# Patient Record
Sex: Female | Born: 1935
Health system: Southern US, Community
[De-identification: ages and names within clinical notes are randomized; demographics above are authoritative.]

## PROBLEM LIST (undated history)

## (undated) DIAGNOSIS — J449 Chronic obstructive pulmonary disease, unspecified: Secondary | ICD-10-CM

## (undated) DIAGNOSIS — B001 Herpesviral vesicular dermatitis: Secondary | ICD-10-CM

## (undated) DIAGNOSIS — Z9889 Other specified postprocedural states: Secondary | ICD-10-CM

## (undated) DIAGNOSIS — I313 Pericardial effusion (noninflammatory): Secondary | ICD-10-CM

## (undated) DIAGNOSIS — N183 Chronic kidney disease, stage 3 unspecified: Secondary | ICD-10-CM

## (undated) DIAGNOSIS — M419 Scoliosis, unspecified: Secondary | ICD-10-CM

## (undated) DIAGNOSIS — I5032 Chronic diastolic (congestive) heart failure: Secondary | ICD-10-CM

## (undated) DIAGNOSIS — E7800 Pure hypercholesterolemia, unspecified: Secondary | ICD-10-CM

## (undated) DIAGNOSIS — R601 Generalized edema: Secondary | ICD-10-CM

## (undated) DIAGNOSIS — I10 Essential (primary) hypertension: Secondary | ICD-10-CM

## (undated) DIAGNOSIS — F0390 Unspecified dementia without behavioral disturbance: Secondary | ICD-10-CM

## (undated) DIAGNOSIS — M5416 Radiculopathy, lumbar region: Secondary | ICD-10-CM

## (undated) DIAGNOSIS — I2510 Atherosclerotic heart disease of native coronary artery without angina pectoris: Secondary | ICD-10-CM

## (undated) DIAGNOSIS — M479 Spondylosis, unspecified: Secondary | ICD-10-CM

## (undated) DIAGNOSIS — M4310 Spondylolisthesis, site unspecified: Secondary | ICD-10-CM

## (undated) DIAGNOSIS — R112 Nausea with vomiting, unspecified: Secondary | ICD-10-CM

## (undated) HISTORY — PX: OTHER SURGICAL HISTORY: SHX169

## (undated) HISTORY — DX: Radiculopathy, lumbar region: M54.16

## (undated) HISTORY — DX: Spondylolisthesis, site unspecified: M43.10

## (undated) HISTORY — DX: Atherosclerotic heart disease of native coronary artery without angina pectoris: I25.10

## (undated) HISTORY — DX: Spondylosis, unspecified: M47.9

## (undated) HISTORY — DX: Pericardial effusion (noninflammatory): I31.3

## (undated) HISTORY — PX: BACK SURGERY: SHX140

## (undated) HISTORY — DX: Morbid (severe) obesity due to excess calories: E66.01

## (undated) HISTORY — DX: Scoliosis, unspecified: M41.9

## (undated) HISTORY — DX: Generalized edema: R60.1

## (undated) HISTORY — PX: BREAST BIOPSY: SHX20

## (undated) HISTORY — DX: Herpesviral vesicular dermatitis: B00.1

## (undated) HISTORY — DX: Essential (primary) hypertension: I10

## (undated) HISTORY — DX: Pure hypercholesterolemia, unspecified: E78.00

## (undated) HISTORY — PX: VAGINAL HYSTERECTOMY: SHX2639

## (undated) HISTORY — PX: EYE SURGERY: SHX253

---

## 1998-03-14 ENCOUNTER — Ambulatory Visit (HOSPITAL_COMMUNITY): Admission: RE | Admit: 1998-03-14 | Discharge: 1998-03-14 | Payer: Self-pay | Admitting: *Deleted

## 1998-03-20 ENCOUNTER — Ambulatory Visit (HOSPITAL_COMMUNITY): Admission: RE | Admit: 1998-03-20 | Discharge: 1998-03-20 | Payer: Self-pay | Admitting: *Deleted

## 1999-03-05 ENCOUNTER — Ambulatory Visit (HOSPITAL_COMMUNITY): Admission: RE | Admit: 1999-03-05 | Discharge: 1999-03-05 | Payer: Self-pay | Admitting: Cardiology

## 1999-03-15 ENCOUNTER — Other Ambulatory Visit: Admission: RE | Admit: 1999-03-15 | Discharge: 1999-03-15 | Payer: Self-pay | Admitting: Obstetrics and Gynecology

## 1999-03-27 ENCOUNTER — Encounter (HOSPITAL_COMMUNITY): Admission: RE | Admit: 1999-03-27 | Discharge: 1999-06-04 | Payer: Self-pay | Admitting: Cardiology

## 1999-06-05 ENCOUNTER — Encounter (HOSPITAL_COMMUNITY): Admission: RE | Admit: 1999-06-05 | Discharge: 1999-09-03 | Payer: Self-pay | Admitting: Cardiology

## 2000-02-02 ENCOUNTER — Emergency Department (HOSPITAL_COMMUNITY): Admission: EM | Admit: 2000-02-02 | Discharge: 2000-02-02 | Payer: Self-pay | Admitting: Emergency Medicine

## 2000-02-02 ENCOUNTER — Encounter: Payer: Self-pay | Admitting: Emergency Medicine

## 2000-02-28 ENCOUNTER — Encounter: Payer: Self-pay | Admitting: Orthopedic Surgery

## 2000-03-07 ENCOUNTER — Inpatient Hospital Stay (HOSPITAL_COMMUNITY): Admission: RE | Admit: 2000-03-07 | Discharge: 2000-03-09 | Payer: Self-pay | Admitting: Orthopedic Surgery

## 2000-03-07 ENCOUNTER — Encounter: Payer: Self-pay | Admitting: Orthopedic Surgery

## 2000-03-15 ENCOUNTER — Encounter: Payer: Self-pay | Admitting: Emergency Medicine

## 2000-03-15 ENCOUNTER — Emergency Department (HOSPITAL_COMMUNITY): Admission: EM | Admit: 2000-03-15 | Discharge: 2000-03-15 | Payer: Self-pay | Admitting: Emergency Medicine

## 2000-05-14 ENCOUNTER — Emergency Department (HOSPITAL_COMMUNITY): Admission: EM | Admit: 2000-05-14 | Discharge: 2000-05-14 | Payer: Self-pay | Admitting: Emergency Medicine

## 2000-05-14 ENCOUNTER — Encounter: Payer: Self-pay | Admitting: Emergency Medicine

## 2001-11-19 ENCOUNTER — Other Ambulatory Visit: Admission: RE | Admit: 2001-11-19 | Discharge: 2001-11-19 | Payer: Self-pay | Admitting: Obstetrics and Gynecology

## 2003-04-25 ENCOUNTER — Ambulatory Visit (HOSPITAL_COMMUNITY): Admission: RE | Admit: 2003-04-25 | Discharge: 2003-04-25 | Payer: Self-pay | Admitting: Cardiovascular Disease

## 2003-04-25 HISTORY — PX: CARDIAC CATHETERIZATION: SHX172

## 2004-08-21 ENCOUNTER — Ambulatory Visit (HOSPITAL_COMMUNITY): Admission: RE | Admit: 2004-08-21 | Discharge: 2004-08-22 | Payer: Self-pay | Admitting: Ophthalmology

## 2005-12-29 ENCOUNTER — Emergency Department (HOSPITAL_COMMUNITY): Admission: EM | Admit: 2005-12-29 | Discharge: 2005-12-29 | Payer: Self-pay | Admitting: Emergency Medicine

## 2006-02-13 ENCOUNTER — Inpatient Hospital Stay (HOSPITAL_COMMUNITY): Admission: RE | Admit: 2006-02-13 | Discharge: 2006-02-15 | Payer: Self-pay | Admitting: Orthopaedic Surgery

## 2006-05-19 ENCOUNTER — Other Ambulatory Visit: Admission: RE | Admit: 2006-05-19 | Discharge: 2006-05-19 | Payer: Self-pay | Admitting: Radiology

## 2009-03-10 ENCOUNTER — Emergency Department (HOSPITAL_COMMUNITY): Admission: EM | Admit: 2009-03-10 | Discharge: 2009-03-10 | Payer: Self-pay | Admitting: Emergency Medicine

## 2009-04-04 DIAGNOSIS — R601 Generalized edema: Secondary | ICD-10-CM | POA: Insufficient documentation

## 2009-04-04 DIAGNOSIS — I3139 Other pericardial effusion (noninflammatory): Secondary | ICD-10-CM

## 2009-04-04 DIAGNOSIS — I313 Pericardial effusion (noninflammatory): Secondary | ICD-10-CM

## 2009-04-04 HISTORY — DX: Generalized edema: R60.1

## 2009-04-04 HISTORY — PX: SUBXYPHOID PERICARDIAL WINDOW: SHX5075

## 2009-04-04 HISTORY — DX: Pericardial effusion (noninflammatory): I31.3

## 2009-04-04 HISTORY — DX: Other pericardial effusion (noninflammatory): I31.39

## 2009-04-17 ENCOUNTER — Encounter: Admission: RE | Admit: 2009-04-17 | Discharge: 2009-06-12 | Payer: Self-pay | Admitting: Endocrinology

## 2009-04-20 ENCOUNTER — Inpatient Hospital Stay (HOSPITAL_COMMUNITY): Admission: EM | Admit: 2009-04-20 | Discharge: 2009-05-01 | Payer: Self-pay | Admitting: Emergency Medicine

## 2009-04-21 ENCOUNTER — Encounter (INDEPENDENT_AMBULATORY_CARE_PROVIDER_SITE_OTHER): Payer: Self-pay | Admitting: Emergency Medicine

## 2009-04-21 ENCOUNTER — Encounter: Payer: Self-pay | Admitting: Thoracic Surgery (Cardiothoracic Vascular Surgery)

## 2009-04-21 ENCOUNTER — Encounter (INDEPENDENT_AMBULATORY_CARE_PROVIDER_SITE_OTHER): Payer: Self-pay | Admitting: Internal Medicine

## 2009-04-21 ENCOUNTER — Ambulatory Visit: Payer: Self-pay | Admitting: Thoracic Surgery (Cardiothoracic Vascular Surgery)

## 2009-04-21 ENCOUNTER — Encounter: Payer: Self-pay | Admitting: Cardiology

## 2009-05-31 ENCOUNTER — Encounter
Admission: RE | Admit: 2009-05-31 | Discharge: 2009-05-31 | Payer: Self-pay | Admitting: Thoracic Surgery (Cardiothoracic Vascular Surgery)

## 2009-05-31 ENCOUNTER — Ambulatory Visit: Payer: Self-pay | Admitting: Thoracic Surgery (Cardiothoracic Vascular Surgery)

## 2009-09-25 ENCOUNTER — Encounter: Admission: RE | Admit: 2009-09-25 | Discharge: 2009-09-25 | Payer: Self-pay | Admitting: Cardiology

## 2010-06-25 ENCOUNTER — Ambulatory Visit: Payer: Self-pay | Admitting: Cardiology

## 2010-10-22 ENCOUNTER — Ambulatory Visit: Payer: Self-pay | Admitting: Cardiology

## 2011-02-11 LAB — URINE MICROSCOPIC-ADD ON

## 2011-02-11 LAB — POCT I-STAT 3, ART BLOOD GAS (G3+)
Acid-Base Excess: 4 mmol/L — ABNORMAL HIGH (ref 0.0–2.0)
Bicarbonate: 32.6 mEq/L — ABNORMAL HIGH (ref 20.0–24.0)
O2 Saturation: 99 %
Patient temperature: 98.2
TCO2: 35 mmol/L (ref 0–100)
pCO2 arterial: 69 mmHg (ref 35.0–45.0)
pH, Arterial: 7.282 — ABNORMAL LOW (ref 7.350–7.400)
pO2, Arterial: 148 mmHg — ABNORMAL HIGH (ref 80.0–100.0)

## 2011-02-11 LAB — BASIC METABOLIC PANEL
BUN: 22 mg/dL (ref 6–23)
BUN: 23 mg/dL (ref 6–23)
BUN: 28 mg/dL — ABNORMAL HIGH (ref 6–23)
BUN: 28 mg/dL — ABNORMAL HIGH (ref 6–23)
BUN: 34 mg/dL — ABNORMAL HIGH (ref 6–23)
BUN: 40 mg/dL — ABNORMAL HIGH (ref 6–23)
BUN: 47 mg/dL — ABNORMAL HIGH (ref 6–23)
CO2: 35 mEq/L — ABNORMAL HIGH (ref 19–32)
CO2: 38 mEq/L — ABNORMAL HIGH (ref 19–32)
CO2: 42 mEq/L — ABNORMAL HIGH (ref 19–32)
CO2: 43 mEq/L — ABNORMAL HIGH (ref 19–32)
CO2: >45 mEq/L — ABNORMAL HIGH (ref 19–32)
CO2: >45 mEq/L — ABNORMAL HIGH (ref 19–32)
CO2: >45 mEq/L — ABNORMAL HIGH (ref 19–32)
Calcium: 7.9 mg/dL — ABNORMAL LOW (ref 8.4–10.5)
Calcium: 8.5 mg/dL (ref 8.4–10.5)
Calcium: 8.6 mg/dL (ref 8.4–10.5)
Calcium: 8.6 mg/dL (ref 8.4–10.5)
Calcium: 8.7 mg/dL (ref 8.4–10.5)
Calcium: 8.9 mg/dL (ref 8.4–10.5)
Calcium: 8.9 mg/dL (ref 8.4–10.5)
Chloride: 101 mEq/L (ref 96–112)
Chloride: 88 mEq/L — ABNORMAL LOW (ref 96–112)
Chloride: 89 mEq/L — ABNORMAL LOW (ref 96–112)
Chloride: 90 mEq/L — ABNORMAL LOW (ref 96–112)
Chloride: 90 mEq/L — ABNORMAL LOW (ref 96–112)
Chloride: 95 mEq/L — ABNORMAL LOW (ref 96–112)
Chloride: 96 mEq/L (ref 96–112)
Creatinine, Ser: 1.32 mg/dL — ABNORMAL HIGH (ref 0.4–1.2)
Creatinine, Ser: 1.35 mg/dL — ABNORMAL HIGH (ref 0.4–1.2)
Creatinine, Ser: 1.43 mg/dL — ABNORMAL HIGH (ref 0.4–1.2)
Creatinine, Ser: 1.43 mg/dL — ABNORMAL HIGH (ref 0.4–1.2)
Creatinine, Ser: 1.52 mg/dL — ABNORMAL HIGH (ref 0.4–1.2)
Creatinine, Ser: 1.64 mg/dL — ABNORMAL HIGH (ref 0.4–1.2)
Creatinine, Ser: 1.8 mg/dL — ABNORMAL HIGH (ref 0.4–1.2)
GFR calc Af Amer: 33 mL/min — ABNORMAL LOW (ref >60)
GFR calc Af Amer: 37 mL/min — ABNORMAL LOW (ref >60)
GFR calc Af Amer: 40 mL/min — ABNORMAL LOW (ref >60)
GFR calc Af Amer: 43 mL/min — ABNORMAL LOW (ref >60)
GFR calc Af Amer: 43 mL/min — ABNORMAL LOW (ref >60)
GFR calc Af Amer: 46 mL/min — ABNORMAL LOW (ref >60)
GFR calc Af Amer: 47 mL/min — ABNORMAL LOW (ref >60)
GFR calc non Af Amer: 27 mL/min — ABNORMAL LOW (ref >60)
GFR calc non Af Amer: 31 mL/min — ABNORMAL LOW (ref >60)
GFR calc non Af Amer: 33 mL/min — ABNORMAL LOW (ref >60)
GFR calc non Af Amer: 36 mL/min — ABNORMAL LOW (ref >60)
GFR calc non Af Amer: 36 mL/min — ABNORMAL LOW (ref >60)
GFR calc non Af Amer: 38 mL/min — ABNORMAL LOW (ref >60)
GFR calc non Af Amer: 39 mL/min — ABNORMAL LOW (ref >60)
Glucose, Bld: 103 mg/dL — ABNORMAL HIGH (ref 70–99)
Glucose, Bld: 107 mg/dL — ABNORMAL HIGH (ref 70–99)
Glucose, Bld: 117 mg/dL — ABNORMAL HIGH (ref 70–99)
Glucose, Bld: 140 mg/dL — ABNORMAL HIGH (ref 70–99)
Glucose, Bld: 150 mg/dL — ABNORMAL HIGH (ref 70–99)
Glucose, Bld: 173 mg/dL — ABNORMAL HIGH (ref 70–99)
Glucose, Bld: 72 mg/dL (ref 70–99)
Potassium: 3.4 mEq/L — ABNORMAL LOW (ref 3.5–5.1)
Potassium: 3.5 mEq/L (ref 3.5–5.1)
Potassium: 3.6 mEq/L (ref 3.5–5.1)
Potassium: 3.9 mEq/L (ref 3.5–5.1)
Potassium: 4 mEq/L (ref 3.5–5.1)
Potassium: 4.1 mEq/L (ref 3.5–5.1)
Potassium: 4.8 mEq/L (ref 3.5–5.1)
Sodium: 139 mEq/L (ref 135–145)
Sodium: 140 mEq/L (ref 135–145)
Sodium: 140 mEq/L (ref 135–145)
Sodium: 141 mEq/L (ref 135–145)
Sodium: 142 mEq/L (ref 135–145)
Sodium: 142 mEq/L (ref 135–145)
Sodium: 143 mEq/L (ref 135–145)

## 2011-02-11 LAB — GLUCOSE, CAPILLARY
Glucose-Capillary: 100 mg/dL — ABNORMAL HIGH (ref 70–99)
Glucose-Capillary: 100 mg/dL — ABNORMAL HIGH (ref 70–99)
Glucose-Capillary: 101 mg/dL — ABNORMAL HIGH (ref 70–99)
Glucose-Capillary: 102 mg/dL — ABNORMAL HIGH (ref 70–99)
Glucose-Capillary: 102 mg/dL — ABNORMAL HIGH (ref 70–99)
Glucose-Capillary: 103 mg/dL — ABNORMAL HIGH (ref 70–99)
Glucose-Capillary: 103 mg/dL — ABNORMAL HIGH (ref 70–99)
Glucose-Capillary: 104 mg/dL — ABNORMAL HIGH (ref 70–99)
Glucose-Capillary: 107 mg/dL — ABNORMAL HIGH (ref 70–99)
Glucose-Capillary: 108 mg/dL — ABNORMAL HIGH (ref 70–99)
Glucose-Capillary: 111 mg/dL — ABNORMAL HIGH (ref 70–99)
Glucose-Capillary: 112 mg/dL — ABNORMAL HIGH (ref 70–99)
Glucose-Capillary: 112 mg/dL — ABNORMAL HIGH (ref 70–99)
Glucose-Capillary: 112 mg/dL — ABNORMAL HIGH (ref 70–99)
Glucose-Capillary: 114 mg/dL — ABNORMAL HIGH (ref 70–99)
Glucose-Capillary: 116 mg/dL — ABNORMAL HIGH (ref 70–99)
Glucose-Capillary: 117 mg/dL — ABNORMAL HIGH (ref 70–99)
Glucose-Capillary: 123 mg/dL — ABNORMAL HIGH (ref 70–99)
Glucose-Capillary: 123 mg/dL — ABNORMAL HIGH (ref 70–99)
Glucose-Capillary: 126 mg/dL — ABNORMAL HIGH (ref 70–99)
Glucose-Capillary: 133 mg/dL — ABNORMAL HIGH (ref 70–99)
Glucose-Capillary: 135 mg/dL — ABNORMAL HIGH (ref 70–99)
Glucose-Capillary: 137 mg/dL — ABNORMAL HIGH (ref 70–99)
Glucose-Capillary: 140 mg/dL — ABNORMAL HIGH (ref 70–99)
Glucose-Capillary: 140 mg/dL — ABNORMAL HIGH (ref 70–99)
Glucose-Capillary: 141 mg/dL — ABNORMAL HIGH (ref 70–99)
Glucose-Capillary: 148 mg/dL — ABNORMAL HIGH (ref 70–99)
Glucose-Capillary: 153 mg/dL — ABNORMAL HIGH (ref 70–99)
Glucose-Capillary: 156 mg/dL — ABNORMAL HIGH (ref 70–99)
Glucose-Capillary: 161 mg/dL — ABNORMAL HIGH (ref 70–99)
Glucose-Capillary: 162 mg/dL — ABNORMAL HIGH (ref 70–99)
Glucose-Capillary: 163 mg/dL — ABNORMAL HIGH (ref 70–99)
Glucose-Capillary: 167 mg/dL — ABNORMAL HIGH (ref 70–99)
Glucose-Capillary: 167 mg/dL — ABNORMAL HIGH (ref 70–99)
Glucose-Capillary: 168 mg/dL — ABNORMAL HIGH (ref 70–99)
Glucose-Capillary: 178 mg/dL — ABNORMAL HIGH (ref 70–99)
Glucose-Capillary: 183 mg/dL — ABNORMAL HIGH (ref 70–99)
Glucose-Capillary: 192 mg/dL — ABNORMAL HIGH (ref 70–99)
Glucose-Capillary: 193 mg/dL — ABNORMAL HIGH (ref 70–99)
Glucose-Capillary: 195 mg/dL — ABNORMAL HIGH (ref 70–99)
Glucose-Capillary: 56 mg/dL — ABNORMAL LOW (ref 70–99)
Glucose-Capillary: 57 mg/dL — ABNORMAL LOW (ref 70–99)
Glucose-Capillary: 60 mg/dL — ABNORMAL LOW (ref 70–99)
Glucose-Capillary: 60 mg/dL — ABNORMAL LOW (ref 70–99)
Glucose-Capillary: 62 mg/dL — ABNORMAL LOW (ref 70–99)
Glucose-Capillary: 66 mg/dL — ABNORMAL LOW (ref 70–99)
Glucose-Capillary: 68 mg/dL — ABNORMAL LOW (ref 70–99)
Glucose-Capillary: 68 mg/dL — ABNORMAL LOW (ref 70–99)
Glucose-Capillary: 72 mg/dL (ref 70–99)
Glucose-Capillary: 72 mg/dL (ref 70–99)
Glucose-Capillary: 74 mg/dL (ref 70–99)
Glucose-Capillary: 77 mg/dL (ref 70–99)
Glucose-Capillary: 78 mg/dL (ref 70–99)
Glucose-Capillary: 79 mg/dL (ref 70–99)
Glucose-Capillary: 80 mg/dL (ref 70–99)
Glucose-Capillary: 84 mg/dL (ref 70–99)
Glucose-Capillary: 85 mg/dL (ref 70–99)
Glucose-Capillary: 85 mg/dL (ref 70–99)
Glucose-Capillary: 88 mg/dL (ref 70–99)
Glucose-Capillary: 94 mg/dL (ref 70–99)
Glucose-Capillary: 95 mg/dL (ref 70–99)
Glucose-Capillary: 99 mg/dL (ref 70–99)
Glucose-Capillary: 99 mg/dL (ref 70–99)

## 2011-02-11 LAB — CBC
HCT: 29.1 % — ABNORMAL LOW (ref 36.0–46.0)
HCT: 29.1 % — ABNORMAL LOW (ref 36.0–46.0)
HCT: 32.2 % — ABNORMAL LOW (ref 36.0–46.0)
HCT: 33.3 % — ABNORMAL LOW (ref 36.0–46.0)
Hemoglobin: 10 g/dL — ABNORMAL LOW (ref 12.0–15.0)
Hemoglobin: 10.4 g/dL — ABNORMAL LOW (ref 12.0–15.0)
Hemoglobin: 9.2 g/dL — ABNORMAL LOW (ref 12.0–15.0)
Hemoglobin: 9.3 g/dL — ABNORMAL LOW (ref 12.0–15.0)
MCHC: 31.1 g/dL (ref 30.0–36.0)
MCHC: 31.2 g/dL (ref 30.0–36.0)
MCHC: 31.7 g/dL (ref 30.0–36.0)
MCHC: 31.8 g/dL (ref 30.0–36.0)
MCV: 82.8 fL (ref 78.0–100.0)
MCV: 83.2 fL (ref 78.0–100.0)
MCV: 83.2 fL (ref 78.0–100.0)
MCV: 83.5 fL (ref 78.0–100.0)
Platelets: 128 10*3/uL — ABNORMAL LOW (ref 150–400)
Platelets: 139 10*3/uL — ABNORMAL LOW (ref 150–400)
Platelets: 139 10*3/uL — ABNORMAL LOW (ref 150–400)
Platelets: 174 10*3/uL (ref 150–400)
RBC: 3.49 MIL/uL — ABNORMAL LOW (ref 3.87–5.11)
RBC: 3.5 MIL/uL — ABNORMAL LOW (ref 3.87–5.11)
RBC: 3.86 MIL/uL — ABNORMAL LOW (ref 3.87–5.11)
RBC: 4.03 MIL/uL (ref 3.87–5.11)
RDW: 18.5 % — ABNORMAL HIGH (ref 11.5–15.5)
RDW: 18.8 % — ABNORMAL HIGH (ref 11.5–15.5)
RDW: 19.2 % — ABNORMAL HIGH (ref 11.5–15.5)
RDW: 19.5 % — ABNORMAL HIGH (ref 11.5–15.5)
WBC: 6.2 10*3/uL (ref 4.0–10.5)
WBC: 7.9 10*3/uL (ref 4.0–10.5)
WBC: 8.1 10*3/uL (ref 4.0–10.5)
WBC: 8.5 10*3/uL (ref 4.0–10.5)

## 2011-02-11 LAB — BRAIN NATRIURETIC PEPTIDE
Pro B Natriuretic peptide (BNP): 1473 pg/mL — ABNORMAL HIGH (ref 0.0–100.0)
Pro B Natriuretic peptide (BNP): 1484 pg/mL — ABNORMAL HIGH (ref 0.0–100.0)
Pro B Natriuretic peptide (BNP): 1793 pg/mL — ABNORMAL HIGH (ref 0.0–100.0)
Pro B Natriuretic peptide (BNP): 32 pg/mL (ref 0.0–100.0)
Pro B Natriuretic peptide (BNP): 56 pg/mL (ref 0.0–100.0)

## 2011-02-11 LAB — POCT CARDIAC MARKERS
CKMB, poc: <1 ng/mL — ABNORMAL LOW (ref 1.0–8.0)
Myoglobin, poc: 75.3 ng/mL (ref 12–200)
Troponin i, poc: <0.05 ng/mL (ref 0.00–0.09)

## 2011-02-11 LAB — COMPREHENSIVE METABOLIC PANEL
ALT: 13 U/L (ref 0–35)
ALT: 14 U/L (ref 0–35)
ALT: 19 U/L (ref 0–35)
AST: 18 U/L (ref 0–37)
AST: 22 U/L (ref 0–37)
AST: 24 U/L (ref 0–37)
Albumin: 2.6 g/dL — ABNORMAL LOW (ref 3.5–5.2)
Albumin: 2.7 g/dL — ABNORMAL LOW (ref 3.5–5.2)
Albumin: 3.5 g/dL (ref 3.5–5.2)
Alkaline Phosphatase: 66 U/L (ref 39–117)
Alkaline Phosphatase: 69 U/L (ref 39–117)
Alkaline Phosphatase: 80 U/L (ref 39–117)
BUN: 47 mg/dL — ABNORMAL HIGH (ref 6–23)
BUN: 48 mg/dL — ABNORMAL HIGH (ref 6–23)
BUN: 49 mg/dL — ABNORMAL HIGH (ref 6–23)
CO2: 31 mEq/L (ref 19–32)
CO2: 33 mEq/L — ABNORMAL HIGH (ref 19–32)
CO2: 35 mEq/L — ABNORMAL HIGH (ref 19–32)
Calcium: 7.8 mg/dL — ABNORMAL LOW (ref 8.4–10.5)
Calcium: 8.3 mg/dL — ABNORMAL LOW (ref 8.4–10.5)
Calcium: 8.5 mg/dL (ref 8.4–10.5)
Chloride: 102 mEq/L (ref 96–112)
Chloride: 99 mEq/L (ref 96–112)
Chloride: 99 mEq/L (ref 96–112)
Creatinine, Ser: 1.65 mg/dL — ABNORMAL HIGH (ref 0.4–1.2)
Creatinine, Ser: 1.82 mg/dL — ABNORMAL HIGH (ref 0.4–1.2)
Creatinine, Ser: 1.92 mg/dL — ABNORMAL HIGH (ref 0.4–1.2)
GFR calc Af Amer: 31 mL/min — ABNORMAL LOW (ref >60)
GFR calc Af Amer: 33 mL/min — ABNORMAL LOW (ref >60)
GFR calc Af Amer: 37 mL/min — ABNORMAL LOW (ref >60)
GFR calc non Af Amer: 25 mL/min — ABNORMAL LOW (ref >60)
GFR calc non Af Amer: 27 mL/min — ABNORMAL LOW (ref >60)
GFR calc non Af Amer: 30 mL/min — ABNORMAL LOW (ref >60)
Glucose, Bld: 69 mg/dL — ABNORMAL LOW (ref 70–99)
Glucose, Bld: 74 mg/dL (ref 70–99)
Glucose, Bld: 75 mg/dL (ref 70–99)
Potassium: 4.4 mEq/L (ref 3.5–5.1)
Potassium: 4.7 mEq/L (ref 3.5–5.1)
Potassium: 5.1 mEq/L (ref 3.5–5.1)
Sodium: 136 mEq/L (ref 135–145)
Sodium: 141 mEq/L (ref 135–145)
Sodium: 142 mEq/L (ref 135–145)
Total Bilirubin: 0.7 mg/dL (ref 0.3–1.2)
Total Bilirubin: 0.8 mg/dL (ref 0.3–1.2)
Total Bilirubin: 1 mg/dL (ref 0.3–1.2)
Total Protein: 4.8 g/dL — ABNORMAL LOW (ref 6.0–8.3)
Total Protein: 5.2 g/dL — ABNORMAL LOW (ref 6.0–8.3)
Total Protein: 6.1 g/dL (ref 6.0–8.3)

## 2011-02-11 LAB — URINALYSIS, ROUTINE W REFLEX MICROSCOPIC
Bilirubin Urine: NEGATIVE
Glucose, UA: NEGATIVE mg/dL
Ketones, ur: NEGATIVE mg/dL
Leukocytes, UA: NEGATIVE
Nitrite: NEGATIVE
Protein, ur: NEGATIVE mg/dL
Specific Gravity, Urine: 1.008 (ref 1.005–1.030)
Urobilinogen, UA: 0.2 mg/dL (ref 0.0–1.0)
pH: 5 (ref 5.0–8.0)

## 2011-02-11 LAB — CARDIAC PANEL(CRET KIN+CKTOT+MB+TROPI)
CK, MB: 0.7 ng/mL (ref 0.3–4.0)
CK, MB: 0.7 ng/mL (ref 0.3–4.0)
Relative Index: INVALID (ref 0.0–2.5)
Relative Index: INVALID (ref 0.0–2.5)
Total CK: 23 U/L (ref 7–177)
Total CK: 34 U/L (ref 7–177)
Troponin I: 0.01 ng/mL (ref 0.00–0.06)
Troponin I: 0.01 ng/mL (ref 0.00–0.06)

## 2011-02-11 LAB — POCT I-STAT, CHEM 8
BUN: 53 mg/dL — ABNORMAL HIGH (ref 6–23)
Calcium, Ion: 1.1 mmol/L — ABNORMAL LOW (ref 1.12–1.32)
Chloride: 100 mEq/L (ref 96–112)
Creatinine, Ser: 1.7 mg/dL — ABNORMAL HIGH (ref 0.4–1.2)
Glucose, Bld: 65 mg/dL — ABNORMAL LOW (ref 70–99)
HCT: 35 % — ABNORMAL LOW (ref 36.0–46.0)
Hemoglobin: 11.9 g/dL — ABNORMAL LOW (ref 12.0–15.0)
Potassium: 4.1 mEq/L (ref 3.5–5.1)
Sodium: 140 mEq/L (ref 135–145)
TCO2: 34 mmol/L (ref 0–100)

## 2011-02-11 LAB — APTT: aPTT: 35 seconds (ref 24–37)

## 2011-02-11 LAB — TYPE AND SCREEN
ABO/RH(D): A POS
Antibody Screen: NEGATIVE

## 2011-02-11 LAB — BODY FLUID CULTURE: Culture: NO GROWTH

## 2011-02-11 LAB — LIPID PANEL
Cholesterol: 85 mg/dL (ref 0–200)
HDL: 27 mg/dL — ABNORMAL LOW (ref >39)
LDL Cholesterol: 39 mg/dL (ref 0–99)
Total CHOL/HDL Ratio: 3.1 RATIO
Triglycerides: 93 mg/dL (ref <150)
VLDL: 19 mg/dL (ref 0–40)

## 2011-02-11 LAB — LACTATE DEHYDROGENASE, PLEURAL OR PERITONEAL FLUID: LD, Fluid: 1933 U/L

## 2011-02-11 LAB — PROTEIN, BODY FLUID: Total protein, fluid: 4.3 g/dL

## 2011-02-11 LAB — PROTIME-INR
INR: 1.2 (ref 0.00–1.49)
Prothrombin Time: 15.9 seconds — ABNORMAL HIGH (ref 11.6–15.2)

## 2011-02-11 LAB — URINE CULTURE
Colony Count: NO GROWTH
Culture: NO GROWTH

## 2011-02-11 LAB — GLUCOSE, SEROUS FLUID: Glucose, Fluid: 49 mg/dL

## 2011-02-11 LAB — DIFFERENTIAL
Basophils Absolute: 0 10*3/uL (ref 0.0–0.1)
Basophils Relative: 0 % (ref 0–1)
Eosinophils Absolute: 0.2 10*3/uL (ref 0.0–0.7)
Eosinophils Relative: 3 % (ref 0–5)
Lymphocytes Relative: 16 % (ref 12–46)
Lymphs Abs: 1 10*3/uL (ref 0.7–4.0)
Monocytes Absolute: 0.5 10*3/uL (ref 0.1–1.0)
Monocytes Relative: 9 % (ref 3–12)
Neutro Abs: 4.5 10*3/uL (ref 1.7–7.7)
Neutrophils Relative %: 72 % (ref 43–77)

## 2011-02-11 LAB — BODY FLUID CELL COUNT WITH DIFFERENTIAL
Eos, Fluid: 2 %
Lymphs, Fluid: 51 %
Monocyte-Macrophage-Serous Fluid: 3 % — ABNORMAL LOW (ref 50–90)
Neutrophil Count, Fluid: 43 % — ABNORMAL HIGH (ref 0–25)
Total Nucleated Cell Count, Fluid: 1111 cu mm — ABNORMAL HIGH (ref 0–1000)

## 2011-02-11 LAB — CK TOTAL AND CKMB (NOT AT ARMC)
CK, MB: 0.8 ng/mL (ref 0.3–4.0)
Relative Index: INVALID (ref 0.0–2.5)
Total CK: 37 U/L (ref 7–177)

## 2011-02-11 LAB — HEMOGLOBIN A1C
Hgb A1c MFr Bld: 6.2 % — ABNORMAL HIGH (ref 4.6–6.1)
Mean Plasma Glucose: 131 mg/dL

## 2011-02-11 LAB — MISCELLANEOUS TEST

## 2011-02-11 LAB — TROPONIN I: Troponin I: 0.03 ng/mL (ref 0.00–0.06)

## 2011-02-11 LAB — GRAM STAIN

## 2011-02-11 LAB — AMYLASE, BODY FLUID: Amylase, Fluid: 59 U/L

## 2011-02-11 LAB — TSH: TSH: 1.466 u[IU]/mL (ref 0.350–4.500)

## 2011-02-11 LAB — CA 125
CA 125: 202.5 U/mL — ABNORMAL HIGH (ref 0.0–30.2)
CA 125: 205.4 U/mL — ABNORMAL HIGH (ref 0.0–30.2)

## 2011-02-12 LAB — POCT I-STAT, CHEM 8
BUN: 52 mg/dL — ABNORMAL HIGH (ref 6–23)
Calcium, Ion: 1.13 mmol/L (ref 1.12–1.32)
Chloride: 108 mEq/L (ref 96–112)
Creatinine, Ser: 1.7 mg/dL — ABNORMAL HIGH (ref 0.4–1.2)
Glucose, Bld: 143 mg/dL — ABNORMAL HIGH (ref 70–99)
HCT: 33 % — ABNORMAL LOW (ref 36.0–46.0)
Hemoglobin: 11.2 g/dL — ABNORMAL LOW (ref 12.0–15.0)
Potassium: 4.3 mEq/L (ref 3.5–5.1)
Sodium: 142 mEq/L (ref 135–145)
TCO2: 27 mmol/L (ref 0–100)

## 2011-02-12 LAB — BRAIN NATRIURETIC PEPTIDE: Pro B Natriuretic peptide (BNP): 81 pg/mL (ref 0.0–100.0)

## 2011-02-12 LAB — POCT I-STAT 3, ART BLOOD GAS (G3+)
Acid-Base Excess: 1 mmol/L (ref 0.0–2.0)
Bicarbonate: 27.5 mEq/L — ABNORMAL HIGH (ref 20.0–24.0)
O2 Saturation: 95 %
TCO2: 29 mmol/L (ref 0–100)
pCO2 arterial: 49.4 mmHg — ABNORMAL HIGH (ref 35.0–45.0)
pH, Arterial: 7.353 (ref 7.350–7.400)
pO2, Arterial: 82 mmHg (ref 80.0–100.0)

## 2011-02-12 LAB — CBC
HCT: 30.7 % — ABNORMAL LOW (ref 36.0–46.0)
Hemoglobin: 10 g/dL — ABNORMAL LOW (ref 12.0–15.0)
MCHC: 32.4 g/dL (ref 30.0–36.0)
MCV: 82 fL (ref 78.0–100.0)
Platelets: 161 10*3/uL (ref 150–400)
RBC: 3.75 MIL/uL — ABNORMAL LOW (ref 3.87–5.11)
RDW: 18.3 % — ABNORMAL HIGH (ref 11.5–15.5)
WBC: 6.9 10*3/uL (ref 4.0–10.5)

## 2011-03-14 ENCOUNTER — Encounter: Payer: Self-pay | Admitting: Cardiology

## 2011-03-15 DIAGNOSIS — I10 Essential (primary) hypertension: Secondary | ICD-10-CM | POA: Insufficient documentation

## 2011-03-15 DIAGNOSIS — I2510 Atherosclerotic heart disease of native coronary artery without angina pectoris: Secondary | ICD-10-CM | POA: Insufficient documentation

## 2011-03-15 DIAGNOSIS — R0600 Dyspnea, unspecified: Secondary | ICD-10-CM | POA: Insufficient documentation

## 2011-03-18 ENCOUNTER — Ambulatory Visit (INDEPENDENT_AMBULATORY_CARE_PROVIDER_SITE_OTHER): Payer: Medicare Other | Admitting: Cardiology

## 2011-03-18 ENCOUNTER — Encounter: Payer: Self-pay | Admitting: Cardiology

## 2011-03-18 DIAGNOSIS — E785 Hyperlipidemia, unspecified: Secondary | ICD-10-CM

## 2011-03-18 DIAGNOSIS — M5430 Sciatica, unspecified side: Secondary | ICD-10-CM | POA: Insufficient documentation

## 2011-03-18 DIAGNOSIS — E119 Type 2 diabetes mellitus without complications: Secondary | ICD-10-CM | POA: Insufficient documentation

## 2011-03-18 DIAGNOSIS — E1169 Type 2 diabetes mellitus with other specified complication: Secondary | ICD-10-CM | POA: Insufficient documentation

## 2011-03-18 DIAGNOSIS — E782 Mixed hyperlipidemia: Secondary | ICD-10-CM | POA: Insufficient documentation

## 2011-03-18 DIAGNOSIS — I509 Heart failure, unspecified: Secondary | ICD-10-CM

## 2011-03-18 DIAGNOSIS — I5032 Chronic diastolic (congestive) heart failure: Secondary | ICD-10-CM

## 2011-03-18 NOTE — Assessment & Plan Note (Signed)
The patient has a history of chronic diastolic congestive heart failure.  Her symptoms have remained stable on 80 mg of furosemide daily.  She has a past history of anasarca secondary to pericardial effusion with tamponade and she underwent a pericardial window in June of 2010.  The patient is not been expressing any chest pain to suggest angina.  She had her last cardiac catheterization in 2004 which showed only mild to moderate coronary artery disease.  She had a nuclear stress test 01/29/06 for preoperative clearance for back surgery at that time and she had a normal adenosine Cardiolite stress test with an ejection fraction of 70%.

## 2011-03-18 NOTE — Progress Notes (Signed)
[name] Date of Birth:  [birth year] [facility] [address]   [address] [place], [place]  [address] [phone]           Fax   [phone]  History of Present Illness: This pleasant 77 year old woman is seen for a scheduled followup office visit.  She has a complex past medical history.  She has a history of chronic diastolic congestive heart failure.  She had cardiac catheterization in 2000 and again in 2004 which did not show any severe coronary disease.  She was found to have mild to moderate distal disease.  Her last nuclear stress test was 01/29/06 and was an adenosine study which showed an ejection fraction of 70% and no ischemia.  The patient had a history of anasarca secondary to pericardial effusion with tamponade in June of 2010 and at pericardial window performed at that time.  Her last echocardiogram was 09/25/09 and showed normal left ventricle systolic function and showed evidence of mild aortic stenosis and normal pulmonary artery pressure.  She does have impaired relaxation with normal systolic function and mild LVH.  Since last visit she's been doing well from a cardiac standpoint but has had progressive problems with low back pain with sciatica and pain radiating down the left leg with some weakness in the left leg.  She has an appointment to see a neurosurgeon soon and anticipates possible surgery  Current Outpatient Prescriptions  Medication Sig Dispense Refill  . amLODipine (NORVASC) 10 MG tablet Take 10 mg by mouth daily.        Marland Kitchen aspirin 81 MG tablet Take 81 mg by mouth daily.        Marland Kitchen atorvastatin (LIPITOR) 40 MG tablet Take 40 mg by mouth daily.        . Etodolac (LODINE PO) Take by mouth daily.        . furosemide (LASIX) 80 MG tablet Take 80 mg by mouth daily. Sometimes twice a day       . glipiZIDE (GLUCOTROL) 5 MG tablet Take 5 mg by mouth daily.       . insulin aspart (NOVOLOG) 100 UNIT/ML injection Inject 3 Units into the skin 3  (three) times daily before meals.        . insulin glargine (LANTUS) 100 UNIT/ML injection Inject 20 Units into the skin at bedtime.       . IRON PO Take 1 tablet by mouth daily.        . metoprolol succinate (TOPROL-XL) 25 MG 24 hr tablet Take 25 mg by mouth 2 (two) times daily.        . Pioglitazone HCl (ACTOS PO) Take 15 mg by mouth daily.       . NON FORMULARY oxygen       . valsartan (DIOVAN) 320 MG tablet Take 320 mg by mouth daily.          Allergies  Allergen Reactions  . Lodine (Etodolac) Other (See Comments)    dizziness    Patient Active Problem List  Diagnoses  . Coronary artery disease  . Hypertension  . Dyspnea  . Anasarca  . Chronic diastolic congestive heart failure  . Dyslipidemia  . Diabetes mellitus  . Sciatica neuralgia    History  Smoking status  . Never Smoker   Smokeless tobacco  . Not on file    History  Alcohol Use: Not on file    Family History  Problem Relation Age of Onset  .  Heart attack Father   . Hypertension Father   . Stroke Mother   . Angina Mother   . Coronary artery disease      Review of Systems: Constitutional: no fever chills diaphoresis or fatigue or change in weight.  Head and neck: no hearing loss, no epistaxis, no photophobia or visual disturbance. Respiratory: No cough, shortness of breath or wheezing. Cardiovascular: No chest pain peripheral edema, palpitations. Gastrointestinal: No abdominal distention, no abdominal pain, no change in bowel habits hematochezia or melena. Genitourinary: No dysuria, no frequency, no urgency, no nocturia. Musculoskeletal:Low back pain with sciatica down left leg Neurological: No dizziness, no headaches, no numbness, no seizures, no syncope, no weakness, no tremors. Hematologic: No lymphadenopathy, no easy bruising. Psychiatric: No confusion, no hallucinations, no sleep disturbance.    Physical Exam: Filed Vitals:   03/18/11 0949  BP: 130/80  Pulse: 76  The general appearance  reveals a well-developed well-nourished woman in no distress.Pupils equal and reactive.   Extraocular Movements are full.  There is no scleral icterus.  The mouth and pharynx are normal.  The neck is supple.  The carotids reveal no bruits.  The jugular venous pressure is normal.  The thyroid is not enlarged.  There is no lymphadenopathy.The chest is clear to percussion and auscultation. There are no rales or rhonchi. Expansion of the chest is symmetrical.The precordium is quiet.  The first heart sound is normal.  The second heart sound is physiologically split.  There is no murmur gallop rub or click.  There is no abnormal lift or heave.The abdomen is soft and nontender. Bowel sounds are normal. The liver and spleen are not enlarged. There Are no abdominal masses. There are no bruits.The pedal pulses are good.  There is no phlebitis or edema.  There is no cyanosis or clubbing.Strength is normal and symmetrical in all extremities.  There is no lateralizing weakness.  There are no sensory deficits.The skin is warm and dry.  There is no rash.   Assessment / Plan: Continue present medication.  Recheck in 4 months for followup office visit.

## 2011-03-18 NOTE — Assessment & Plan Note (Signed)
The patient has had a long history of low back pain and has had previous surgery in her back.  Her surgery was done by her orthopedist.  She is now having pain down her left leg with some weakness in the left leg and has an appointment to see a neurosurgeon soon.  She anticipates that she will probably require surgery.  Her symptoms appear to be progressive

## 2011-03-19 NOTE — Consult Note (Signed)
[name], [name]                  ACCOUNT NO.:  [id number]   MEDICAL RECORD NO.:  [id number]          PATIENT TYPE:  INP   LOCATION:  2312                         FACILITY:  [facility]   PHYSICIAN:  Salvatore Decent. Cornelius Moras, M.D. DATE OF BIRTH:  1931-10-07   DATE OF CONSULTATION:  04/21/2009  DATE OF DISCHARGE:                                 CONSULTATION   REQUESTING PHYSICIAN:  Laurie Clement, MD   PRIMARY CARE PHYSICIAN:  Laurie Fells. Altheimer, MD   REASON FOR CONSULTATION:  Pericardial effusion with pericardial  tamponade.   HISTORY OF PRESENT ILLNESS:  [name] is a 77 year old morbidly obese  white female with history of hypertension and type 2 diabetes mellitus,  who presents with a several-week history of progressive shortness of  breath, orthopnea, and severe bilateral lower extremity edema.  The  patient was admitted to the hospital with severe volume overload and  generalized anasarca with suspected diastolic heart failure.  Chest x-  ray reveals enlarged cardiac silhouette with small bilateral pleural  effusions and pulmonary vascular congestion.  Electrocardiogram  demonstrates normal sinus rhythm with a low voltage throughout and  nonspecific ST and T-wave changes.  Serial cardiac enzymes reveal no  sign of myocardial ischemia.  Two-D echocardiogram demonstrates a large  free-flowing pericardial effusion with diastolic collapse consistent  with pericardial tamponade.  Underlying left ventricular function  appears normal, and no other significant abnormalities are noted.  Cardiothoracic surgical consultation was requested.   Review of systems is documented in the patient's chart.  The patient has  had poor appetite for several days.  She does report having progressive  shortness of breath and edema for several months, but this has been much  worse over the last couple of weeks.  The patient also reports upper  respiratory tract infection approximately 3 months ago.  She has not  had  any recent fevers or chills or night sweats.  She denies any chest pain.  She has severe orthopnea and severe edema with resting shortness of  breath.  Bowel function is normal.  She has no difficulty swallowing.  The remainder of her review of systems is unremarkable.   PAST MEDICAL HISTORY:  1. Hypertension.  2. Diastolic congestive heart failure, chronic.  3. Type 2 diabetes mellitus.   PAST SURGICAL HISTORY:  1. Lumbar laminectomy.  2. Hysterectomy.   FAMILY HISTORY:  Noncontributory.   SOCIAL HISTORY:  The patient is married and lives with her husband  locally here in [place].  She lives a sedentary lifestyle, but has  still been ambulatory and functional and drives an automobile until  recently.  She is a nonsmoker and she denies alcohol consumption.   Medications prior to admission include Lantus insulin, glipizide,  NovoLog sliding scale, aspirin, Lipitor, lisinopril, Lasix, amlodipine,  doxazosin, Diovan, metoprolol, Imdur.   Drug allergies include CODEINE and SULFA.   PHYSICAL EXAMINATION:  GENERAL:  The patient is a morbidly obese white  female, who appears her stated age in no acute distress.  HEENT:  Unrevealing.  NECK:  There is no obvious jugular venous distention,  but it would be  difficult to tell otherwise.  There are no carotid bruits.  LUNGS:  Auscultation of the chest demonstrates diminished breath sounds at both  lung bases.  No wheezes or rhonchi noted.  CARDIOVASCULAR:  Notable for  distant heart sounds.  No obvious murmurs, rubs, or gallops are  appreciated.  ABDOMEN:  Obese, soft, and nontender.  EXTREMITIES:  Warm and adequately perfused.  There is severe (4+)  bilateral lower extremity edema with some weeping of transudative fluid  through the skin.  RECTAL:  Deferred.  GU:  Deferred.  NEUROLOGIC:  Nonfocal.   DIAGNOSTIC TESTS:  Two-D echocardiogram performed earlier today was  reviewed.  This demonstrates a large free-flowing  pericardial effusion  with evidence of right ventricular collapse consistent with tamponade.  The underlying left ventricular function appears normal.  No other  obvious abnormalities are noted.   IMPRESSION:  Large pericardial effusion with pericardial tamponade.   PLAN:  We planned to proceed directly to the operating room for  subxiphoid pericardial window.  I have discussed options at length with  [name] and her husband.  Alternative treatment strategies have been  discussed.  They understand and accept all potential associated risks of  surgery and desired to proceed as described.  All of their questions  have been addressed.      Salvatore Decent. Cornelius Moras, M.D.  Electronically Signed     CHO/MEDQ  D:  04/21/2009  T:  04/22/2009  Job:  [id number]   cc:   [name], M.D.  [name], M.D.

## 2011-03-19 NOTE — Op Note (Signed)
NAMEANGELLI, [name]                  ACCOUNT NO.:  [id number]   MEDICAL RECORD NO.:  [id number]          PATIENT TYPE:  INP   LOCATION:  2312                         FACILITY:  [facility]   PHYSICIAN:  Salvatore Decent. Cornelius Moras, M.D. DATE OF BIRTH:  1931/09/23   DATE OF PROCEDURE:  04/21/2009  DATE OF DISCHARGE:                               OPERATIVE REPORT   PREOPERATIVE DIAGNOSIS:  Pericardial effusion with pericardial  tamponade.   POSTOPERATIVE DIAGNOSIS:  Pericardial effusion with pericardial  tamponade.   PROCEDURE:  Subxiphoid pericardial window.   SURGEON:  Salvatore Decent. Cornelius Moras, MD   ASSISTANT:  Ms. Gus Puma, RNFA   ANESTHESIA:  General.   BRIEF CLINICAL NOTE:  The patient is a 77 year old morbidly obese female  with hypertension and diabetes who presents with class IV congestive  heart failure and right heart failure.  A 2-D echocardiogram  demonstrates a large free-flowing pericardial effusion with impending  tamponade.  A full consultation note has been dictated previously.  The  patient is brought to the operating room for urgent surgical  intervention.   OPERATIVE NOTE DETAILS:  Following full informed consent, the patient is  brought to the operating room on the above-mentioned date and placed in  the supine position on the operating table.  General endotracheal  anesthesia is induced under the care and direction of Dr. Arta Bruce.  A radial arterial line was placed.  Central venous catheter was placed.  A Foley catheter is placed.  Intravenous antibiotics were administered.  The patient's anterior chest, abdomen, both groins were prepared and  draped in sterile manner.  A small midline incision was performed  beginning at the xiphoid process and extending towards the umbilicus for  several centimeters.  The incision is completed through the subcutaneous  tissues with electrocautery.  The linea alba is split in the midline.  The left body of the rectus abdominis muscle is  retracted anteriorly and  the underlying preperitoneal fascia is retracted inferiorly.  The  anterior surface of the pericardium was identified.  The pericardium was  incised with electrocautery.  Upon entry into the pericardial sac, a  large volume of old dark bloody fluid is evacuated.  A total of 650 mL  of fluid is evacuated.  Portions of the fluid are trapped and sent for  cytology, routine culture and sensitivity, and a variety of laboratory  data.  A small 2 x 2 cm circular portion of the pericardium is excised  with electrocautery and sent for routine histology.  The pericardial sac  is now drained with a 28-French Bard drain exited through a separate  stab incision.  Transesophageal echocardiogram was performed, both prior  to and after drainage, and confirms complete evacuation of the  pericardial fluid.  There is normal left ventricular function with no  other significant abnormalities noted.  Of note, the epicardial surface  of the heart appears chronically inflamed with hemorrhagic pericarditis.  The abdominal fascia is closed in the midline with interrupted #1 Vicryl  sutures.  The soft tissues anterior to the fascia  are closed in multiple  layers and skin was closed with subcuticular skin  closure.  The chest tube is fixed to closed suction drainage device.  The patient is extubated in the operating room and transported to the  Post Anesthesia Care Unit in stable condition.  There are no  intraoperative complications.  Estimated blood loss was trivial.      Salvatore Decent. Cornelius Moras, M.D.  Electronically Signed     CHO/MEDQ  D:  04/21/2009  T:  04/22/2009  Job:  [id number]   cc:   [name], M.D.  [name], M.D.

## 2011-03-19 NOTE — Discharge Summary (Signed)
[name], [name]                  ACCOUNT NO.:  [id number]   MEDICAL RECORD NO.:  [id number]          PATIENT TYPE:  INP   LOCATION:  4741                         FACILITY:  [facility]   PHYSICIAN:  Corinna L. Lendell [name], MDDATE OF BIRTH:  03/18/1931   DATE OF ADMISSION:  04/20/2009  DATE OF DISCHARGE:  04/30/2009                               DISCHARGE SUMMARY   DISCHARGE DIAGNOSES:  1. Pericardial effusion with tamponade.  2. Status post subxiphoid pericardial window.  3. Anasarca.  4. Type 2 diabetes.  5. Morbid obesity  6. Hypertension.  7. Chronic diastolic congestive heart failure.  8. Chronic kidney disease.  9. Deconditioning.  10.Healing herpes labialis.  11.History of hysterectomy, unclear whether oophorectomy was done.  12.History of lumbar laminectomy.   DISCHARGE MEDICATIONS:  1. Lasix 80 mg p.o. q. 7:00 a.m. and 2:00 p.m., please adjust based on      renal function and fluid status.  2.  Aspirin 81 mg a day.  2. Glipizide 5 mg a day.  3. Lipitor 40 mg a day.  4. Lisinopril 40 mg a day.  5. Metoprolol 25 mg p.o. b.i.d.  6. KCL 40 mEq a day.  7. Lantus insulin 8 units subcutaneously nightly.  8. Sliding scale Humalog with meals.  9. Tylenol 650 mg p.o. q.4 h., p.r.n. pain.  10.Dulcolax 10 mg a day as needed for constipation..  11.Her amlodipine, doxazosin, Diovan and Imdur have been stopped.   CONDITION:  Stable.   DIET:  Should be low-salt diabetic.   ACTIVITY:  Continue physical therapy, occupational therapy.   CONSULTATIONS:  1. Dr. Patty Sermons.  2. Dr. Cornelius Moras.   PROCEDURES:  See above.   FOLLOW-UP:  She will need CBG q.a.c. and h.s.  She will need daily  weights.  She will need a basic metabolic panel in 2 days and  periodically thereafter, defer to nursing home physician.  Upon  discharge from nursing home, she will need follow-up with Dr. Patty Sermons  and Dr. Leslie Dales.  She will also need follow-up with her gynecologist  for pelvic exam and any further  workup if needed.   LABORATORY DATA:  ABG on admission showed a pH of 7.353, pCO2 49, pO2  82, bicarbonate 27, saturation 95%, unclear what oxygen if any this was  on.  CBC on admission significant for a hemoglobin of 11.2, hematocrit  33.  Basic metabolic panel on admission significant for a glucose of  143, BUN of 52, creatinine 1.7.  At discharge, her BUN is 28, creatinine  1.8.  Hemoglobin A1c was 6.2.  BNP on admission was 81.  Serial cardiac  enzymes negative.  LDL 39, HDL 27, TSH 1.466.  CA-125 was 202.  Pericardial fluid showed 49 glucose, 4.3 total protein, LDH 1933,  amylase 59.  It was red, turbid.  White count 1011 with 43% neutrophils,  51% lymphocytes.  She had a urinalysis on April 27, 2009, which showed  moderate blood, but negative nitrites, negative leukocyte esterase.  Urine culture on admission was negative.  Gram stain of the pericardial  fluid showed few white cells,  no organisms and culture was negative.  Cytology of the pericardial fluid showed no malignant cells, rare  mesothelial cells and chronic inflammation.   SPECIAL STUDIES:  Radiology on admission, two-views of the chest showed  cardiomegaly with tiny bilateral pleural effusions and vascular  congestion.  She had serial chest x-rays.  CT of the abdomen and pelvis  without contrast showed no mass or lymphadenopathy, marked diffuse  abdominal wall edema, mild intra-abdominal ascites, no evidence of  pelvic mass.  CT chest without contrast on April 23, 2009, showed no  mass.  There were small bilateral pleural effusions.  Echocardiogram on  April 21, 2009, showed ejection fraction 60-65%, abnormal left  ventricular relaxation, large free-flowing pericardial effusion with  tamponade physiology.  Peak pulmonary artery pressure was 41 mm.  EKG  showed normal sinus rhythm with low voltage throughout.   HISTORY AND HOSPITAL COURSE:  [name] is a 77 year old white female  patient of Dr. Leslie Dales and Patty Sermons who  had, had worsening weight  gain, shortness of breath, orthopnea and edema.  She had been followed  by Dr. Patty Sermons, who was consulted.  Please see H and P for complete  admission details.  She had a blood pressure of 100/53, pulse 78,  temperature of 97.4, respiratory rate 18, oxygen saturations 96% on room  air.  She had obesity, clear lungs, 3+ edema.  She was felt to have  diastolic heart failure and anasarca.  She was started on IV Lasix.  An  echocardiogram was done which showed tamponade.  Cardiothoracic surgery  was consulted and performed subxiphoid window.  She also had a central  line placed at that time.  She had been placed on empiric antibiotics.  The culture and cytology came back negative.  Her medications were  adjusted for the anasarca.  Dr. Rito Ehrlich ordered a CA-125 to further  evaluate her pericardial effusion and anasarca.  It was elevated,  however, CAT scan showed no pelvic mass, abdominal mass or lung mass.  After researching and discussing with Dr. Cyndie Chime, I suspect this is  related to the ascites and really cannot be evaluated in this clinical  situation.  Nevertheless, I have recommended that the patient follow up  with her gynecologist for a complete pelvic exam and further workup if  needed.  She had a hysterectomy in 1979, and she does not recall whether  or not she had an oophorectomy.  The patient has diuresed over 20  pounds.  Her shortness of breath has improved.  Her other medical  problems remained  stable.  She is, however, quite debilitated and is going to skilled  nursing facility for continued rehabilitation.  Her fluid status, renal  function and electrolytes will need to be monitored closely and any  adjustments in Lasix and other medications can be made as needed.  Total  time on the day of discharge is 45 minutes.      Corinna L. Lendell [name], MD  Electronically Signed     CLS/MEDQ  D:  05/01/2009  T:  05/01/2009  Job:  [id number]   cc:    Cassell Clement, M.D.  Salvatore Decent. Cornelius Moras, M.D.  Veverly Fells. Altheimer, M.D.

## 2011-03-19 NOTE — H&P (Signed)
[name], [name]                  ACCOUNT NO.:  [id number]   MEDICAL RECORD NO.:  [id number]          PATIENT TYPE:  INP   LOCATION:  1843                         FACILITY:  [facility]   PHYSICIAN:  Eduard Clos, MDDATE OF BIRTH:  December 01, 1930   DATE OF ADMISSION:  04/20/2009  DATE OF DISCHARGE:                              HISTORY & PHYSICAL   PRIMARY CARDIOLOGIST:  Cassell Clement, M.D.   PRIMARY CARE PHYSICIAN:  Veverly Fells. Altheimer, M.D.   CHIEF COMPLAINT:  Shortness of breath.   HISTORY OF PRESENT ILLNESS:  A 77 year old female with a known history  of hypertension, diabetes mellitus type 2 presented with complaints of  increasing shortness of breath over the last few weeks.  Over the last  few days, the patient's intensity increased, and they also felt that she  has been getting more fluid overload with edema.  Patient denies any  chest pain.  Denies any palpitations, dizziness, loss of consciousness,  or focal deficits, headache, nausea, vomiting, diarrhea, fever or  chills.  Patient has been admitted for further observation, evaluation,  and management of a possible diastolic heart failure with anasarca.   PAST MEDICAL HISTORY:  1. Hypertension.  2. Diabetes mellitus type 2.   PAST SURGICAL HISTORY:  Low back surgery.   MEDICATIONS PRIOR TO ADMISSION:  1. Lantus insulin 18 units subcutaneous at bedtime.  2. Glipizide 5 mg p.o. daily.  3. NovoLog sliding scale.  4. Aspirin 81 mg p.o. daily.  5. Lipitor 40 mg p.o. daily.  6. Lisinopril 40 mg p.o. daily.  7. Lasix 120 mg in the a.m. and 80 mg in the p.m.  8. Amlodipine 2.5 mg p.o. daily.  9. Doxazosin 4 mg p.o. daily.  10.Diovan 160 mg p.o. daily.  11.Metoprolol 25 mg p.o. b.i.d.  12.Imdur 30 mg p.o. daily.   ALLERGIES:  1. CODEINE.  2. SULFA.   FAMILY HISTORY:  Nothing contributory.   SOCIAL HISTORY:  Patient lives with her husband.  Denies smoking  cigarettes, drinking alcohol, using illegal drugs.   REVIEW OF SYSTEMS:  As per the history of present illness, nothing else  significant.   PHYSICAL EXAMINATION:  Patient examined at bedside.  Not in acute  distress.  VITAL SIGNS:  Blood pressure is 100/53, pulse 78 per minute, temperature  97.4, respirations 18 per minute, O2 sat 96%.  HEENT:  Anicteric.  No pallor.  CHEST:  Bilateral air entry present.  No rhonchi, no crepitation.  HEART:  S1 and S2 heard.  ABDOMEN:  Soft.  Mildly obese.  Nontender.  Bowel sounds present.  CNS:  Awake, alert and oriented to time, place, and person.  Moves upper  and lower extremities.  EXTREMITIES:  There is 3+ edema extending up to her thighs.   LABS:  EKG shows a normal sinus rhythm with nonspecific ST/T changes  with low voltage.  It is different from her previous EKG.   CBC:  WBC 6.3, hemoglobin 11.9, hematocrit 35, platelets 174.  Basic  metabolic panel:  Sodium 140, potassium 4.1, chloride 100, glucose 65,  BUN 53, creatinine 1.7.  Troponin I less than 0.05.  CK-MB less than 1.  BNP 56.   Chest x-ray shows cardiomegaly with vascular congestion and tiny  bilateral pleural effusion.   ASSESSMENT:  1. Shortness of breath, probably from diastolic heart failure.  2. Anasarca from diastolic heart failure.  3. Chronic kidney disease.  4. Diabetes mellitus type 2.  5. Hypertension.  6. Obesity.   PLAN:  1. Will admit patient to telemetry.  2. Place patient on IV Lasix 18 mg IV q.12h.  If does not get adequate      diuresis, may have to add Zaroxylan.  3. Will get a 2D echo.  At this time, patient's EKG shows low voltage,      but the patient has no symptoms of tamponade.  4. Will restrict fluids to 1.3 liters per day.  5. Cycle cardiac markers.  6. Check a TSH and fasting lipid panel, hemoglobin A1C.  7. Place patient on GI prophylaxis and DVT prophylaxis.  Will get      lower extremity Dopplers to rule out DVT.  8. Further recommendations as patient's condition evolves.      Eduard Clos, MD  Electronically Signed     ANK/MEDQ  D:  04/20/2009  T:  04/20/2009  Job:  [id number]   cc:   Cassell Clement, M.D.  Veverly Fells. Altheimer, M.D.

## 2011-03-19 NOTE — Consult Note (Signed)
NAMECARLEAH, [name]                  ACCOUNT NO.:  [id number]   MEDICAL RECORD NO.:  [id number]          PATIENT TYPE:  INP   LOCATION:  2312                         FACILITY:  [facility]   PHYSICIAN:  [name], M.D. DATE OF BIRTH:  08/16/1931   DATE OF CONSULTATION:  04/21/2009  DATE OF DISCHARGE:                                 CONSULTATION   HISTORY:  I was asked to see this pleasant 77 year old woman by Dr.  Leslie [name] for evaluation of anasarca.  This patient has been followed in  my office for several years.  She was last seen in my office on February 27, 2009.  She has a history of hypertension and adult-onset diabetes  mellitus and previous murmurs of mitral regurgitation and aortic  stenosis neither of which have been severe by echo.  She has had no  progressive problem with peripheral edema.  When she was seen in our  office on February 27, 2009, she weighed 226 pounds.  Since then despite  aggressive diuresis, she has gained 20 pounds and has developed overt  anasarca.  She has not been experiencing any chest pain.   HOME MEDICATIONS:  1. Lipitor 80 mg one-half daily.  2. Amlodipine 10 mg daily.  3. Diovan 160 mg twice a day.  4. Furosemide 120 mg in the morning and 80 mg in the evening.  5. Isosorbide 30 mg daily.  6. Maxzide 25 mg daily.  7. Vitamin D weekly.  8. Metoprolol 25 mg b.i.d.  9. Doxazosin 8 mg daily.  10.Lisinopril 40 mg daily.  11.Lantus 25 units daily.  12.Insulin on a sliding scale.  13.Lexapro.   Her review of systems reveals that she has not been experiencing any  change in bowel habits.  She has had no hematochezia or melena.  She has  not been experiencing any purulent sputum or hemoptysis.  She has had no  chills or fever.  She has not been experiencing any dysuria or  hematuria.  She denies chest pain but has just had worsening anasarca  and difficulty getting around because of her increasing weight.  Earlier  this week, the patient went to [name] where they found her oxygen  saturation to be extremely low in the range of 76, and she was  prescribed home oxygen.  On April 20, 2009, she was seen in Dr.  Berline [name] office who noted anasarca and arrangements were made for her  to be admitted under the [facility] for further inpatient  management and evaluation.   The family history is positive for coronary artery disease.  Her father  died at 77.   Social history reveals that she does not use alcohol or tobacco.   Past surgical history includes low back surgery.   She is allergic to CODEINE and SULFA.   On physical exam, her weight is approximately 245.  Her oxygen  saturation on 3 L a minute is 89%.  Pulse is 80 and regular.  Her blood  pressure is 115/80, and there is no significant paradoxical pulse.  The  general appearance  reveals an elderly Caucasian woman who has marked  anasarca.  She is in no acute distress otherwise.  The pupils are equal  and reactive.  Extraocular movements are full.  Sclerae are nonicteric.  Mouth and pharynx normal.  Jugular venous pressure is elevated.  The  carotids have a normal upstroke.  The chest reveals bibasilar rales.  The heart reveals soft heart tones.  There is no murmur.  There is no  gallop or rub.  The abdomen is obese.  The extremities show marked  anasarca, although pedal pulses are easily felt.  She has anasarca of  the legs, abdomen, and thorax up to the level of the breasts.   INITIAL LABORATORY STUDIES:  White count 6300, hemoglobin 11.9,  platelets 174,000.  Sodium 140, potassium 4.1, blood sugar is 65, BUN  53, and creatinine 1.7.  Troponin I is less than 0.05.  CK is less than  1.  B-natriuretic peptide is normal at 56.   The chest x-ray shows marked cardiomegaly with increase in globular  appearance of the heart and heart size since May 2010.   IMPRESSION:  Anasarca of uncertain etiology.  This is somewhat unusual  and that she has a normal brain  natriuretic peptide and has always had a  normal BNP when we have checked her in the office as well.  We need to  rule out significant pericardial effusion with pericardial tamponade.   RECOMMENDATIONS:  We will want to get a two-dimensional echocardiogram  this morning and proceed accordingly.   Many thanks for the opportunity to see this pleasant and challenging  woman with you.           ______________________________  [name], M.D.     TB/MEDQ  D:  04/21/2009  T:  04/21/2009  Job:  [id number]   cc:   [facility].  [name]. [name], M.D.

## 2011-03-19 NOTE — Assessment & Plan Note (Signed)
OFFICE VISIT   [name], [name]  DOB:  Jun 10, 1931                                        May 31, 2009  CHART #:  [id number]   HISTORY OF PRESENT ILLNESS:  The patient returns to the office today for  a postoperative followup, status post subxiphoid pericardial window on  April 21, 2009.  Postoperatively, the patient has done remarkably well.  Pericardial fluid cytology was negative and all cultures have remained  negative.  The fluid was notably somewhat bloody and consistent with  longstanding pericarditis and/or malignancy.  However, the cytology was  negative.  Pap was negative, and the patient has no other sign of  underlying malignant process.  CT scan of the chest, abdomen, pelvis  revealed no adenopathy or other sign of malignancy.  Clinically, the  patient has continued to recover uneventfully.  She states that now she  feels much better than she has in probably 2 years.  She has continued  to diurese remarkably well and has lost a tremendous amount of fluid  weight.  Her activity level is good.  She has no shortness of breath.  She has never had much pain following a procedure.  She has no  complaints.   PHYSICAL EXAMINATION:  General:  Well-appearing female with blood  pressure 117/68, pulse 67 and regular, oxygen saturation 94% on room  air.  Lungs:  Breath sounds are clear to auscultation and symmetrical.  Heart:  Sounds are distant.  There are no murmurs, rubs, or gallops  noted.  These small incisions from pericardial window have healed  nicely.  Abdomen:  Soft, nontender.  Extremities:  Warm and well  perfused.  There is mild bilateral lower extremity edema, dramatically  decreased from the last time, I saw her while she remained in the  hospital.   DIAGNOSTICS TESTS:  Chest x-ray performed today at the [facility] is reviewed.  This demonstrates clear lung fields  bilaterally without pleural effusions.  No other abnormalities  are  noted.   IMPRESSION:  The patient has done remarkably well.   PLAN:  The patient will call and return to see [place] in the future only  should further problems or difficulties arise.   Salvatore Decent. Cornelius Moras, M.D.  Electronically Signed   CHO/MEDQ  D:  05/31/2009  T:  06/01/2009  Job:  [id number]   cc:   Cassell Clement, M.D.  Veverly Fells. Altheimer, M.D.

## 2011-03-20 ENCOUNTER — Other Ambulatory Visit: Payer: Self-pay | Admitting: Neurosurgery

## 2011-03-20 ENCOUNTER — Ambulatory Visit
Admission: RE | Admit: 2011-03-20 | Discharge: 2011-03-20 | Disposition: A | Discharge status: Home / Self Care | Payer: Medicare Other | Source: Ambulatory Visit | Attending: Neurosurgery | Admitting: Neurosurgery

## 2011-03-20 DIAGNOSIS — M4310 Spondylolisthesis, site unspecified: Secondary | ICD-10-CM

## 2011-03-22 NOTE — Discharge Summary (Signed)
[facility]  Patient:    [name], [name]                         MRN: [id number] Adm. Date:  [id number] Disc. Date: [id number] Attending:  Tobey Bride Dictator:   Alexzandrew L. Perkins, P.A.-C.                           Discharge Summary  ADMITTING DIAGNOSES: 1. Left L3/4 lateral disk herniation with synovial cyst. 2. Hypertension. 3. Diabetes. 4. Hypercholesterolemia. 5. Mild coronary artery disease.  DISCHARGE DIAGNOSES: 1. Large herniated lumbar disk L3/4 with foraminal and extra foraminal    extension on left with a small synovial cyst on the left status post micro    diskectomy left L3/4 with partial facetectomy left L3/4 and removal of    synovial cyst left L3/4. 2. Hypertension. 3. Diabetes. 4. Hypercholesterolemia. 5. Mild coronary artery disease.  PROCEDURE:  Patient was taken to the OR on Mar 07, 2000.  Underwent micro diskectomy on the left L3/4 with partial facetectomy on the left L3/4 and removal of synovial cyst on the left L3/4.  SURGEON:  Georges Lynch. Darrelyn Hillock, M.D.  ASSISTANT:  Javier Docker, M.D.  ANESTHESIA:  Surgery under general anesthesia.  CONSULTS:  None.  BRIEF HISTORY:  Patient is a 77 year old female who has had a four week history of increasing left leg pain, significant pain affecting the activities of daily living.  She was found on examination to have pronounced weakness of her hip flexors and very painful sciatic type pain.  She was sent for an MRI which demonstrated synovial cyst on the left at L3/4 with an extra foraminal disk on the left at L3/4.  Due to the findings it was felt she would be be served by undergoing surgical decompression.  Risks and benefits were discussed with the patient.  She elected to proceed with surgery.  Patient subsequently admitted to [facility].  LABORATORIES:  CBC on admission:  Hemoglobin 11.4, hematocrit 32.2, white cell count 7.0, red cell count 3.86.  PT/PTT on  admission 13.9 and 32 respectively. INR 1.2.  Chem panel on admission all within normal limits with the exception of elevated glucose of 252, decreased albumin of 3.3.  Patient was a known diabetic.  Urinalysis on admission:  Positive glucose.  Otherwise, negative urine.  EKG dated February 28, 2000:  Normal sinus rhythm.  No old traces to compare. Confirmed by Dr. Corliss Marcus.  Interoperative lumbar films show lateral lumbar spine with a clamp at the spinous process of L3 with an instrument between the spinous process at L3 and 4.  Instrument directed toward L3/4 innerspace on last film.  Chest x-ray preoperatively dated February 28, 2000: Borderline cardiomegaly.  HOSPITAL COURSE:  Patient admitted to [facility].  Taken to OR and underwent the above said procedure without complication.  PAtient tolerated procedure well.  Was ______ to recovery room and then to the orthopedic floor with continued postoperative care.  Vital signs were followed with patient throughout the hospital course.  Patient was placed on p.c. analgesics for pain control following surgery.  She was also placed on muscle relaxants. Patient is doing quite well following surgery.  Still had some leg pain; however, it was improved.  She was encouraged to use p.o. analgesics.  It was found later on that day she was doing quite well and she requested to  be discharged home.  Patient was weaned off of PCA over to p.o. analgesics and she was later released from the hospital.  DISCHARGE PLAN:  Patient was discharged home on Mar 08, 2000.  DISCHARGE DIAGNOSES:  Please see above.  DISCHARGE MEDICATIONS:  Imiprigan Fortis p.r.n. pain, Robaxin p.r.n. spasm.  DIET:  1800 calorie ADA diet.  ACTIVITY:  Back precautions as tolerated.  May shower five days after surgery.  FOLLOW-UP:  Two weeks in the office.  DISPOSITION:  Home.  CONDITION ON DISCHARGE:  Improved. DD:  04/11/00 TD:  04/17/00 Job: [id number]

## 2011-03-22 NOTE — Op Note (Signed)
[name], [name]                  ACCOUNT NO.:  [id number]   MEDICAL RECORD NO.:  [id number]          PATIENT TYPE:  OIB   LOCATION:  5731                         FACILITY:  [facility]   PHYSICIAN:  Guadelupe Sabin, M.D.DATE OF BIRTH:  10-21-1931   DATE OF PROCEDURE:  08/21/2004  DATE OF DISCHARGE:                                 OPERATIVE REPORT   PREOPERATIVE DIAGNOSES:  1.  Chronic and recurrent vitreous hemorrhage, right eye.  2.  Proliferative diabetic retinopathy, right eye.  3.  Background diabetic retinopathy, right eye.  4.  Diabetes mellitus, insulin-dependent type.   SURGEON:  Guadelupe Sabin, M.D.   ASSISTANT:  Nurse.   ANESTHESIA:  General.   Ophthalmoscopy as previously described.   OPERATIVE PROCEDURE:  After the patient was prepped and draped, a lid  speculum was inserted in the right eye.  The eye was turned downward and a  superior rectus traction suture placed of 4-0 silk.  A peritomy was then  performed adjacent to the limbus from the 8 to 2:30 position superiorly.  The subconjunctival tissue was cleaned and three sclerotomy sites prepared  with the MVR blade at the 8 o'clock, 10 o'clock, and 2 o'clock position 3.5  mm from the limbus using the 20-gauge MVR blade.  The vitreous 4 mm infusion  terminal was inserted at the 8 o'clock position, the fiberoptic Lite-Pipe at  the 2 o'clock, and the handpiece of the vitreous infusion suction cutter at  the 10 o'clock position.  The infusion terminal was held in place with a  preplaced mattress 5-0 Dacron suture.  The pupil was well-dilated and the  tip of the infusion terminal could be seen projecting into the vitreous.  Slow vitreous infusion and suction cutting were begun from an anterior to  posterior direction toward the retina.  Peripheral dense blood clots and  diffuse vitreous hemorrhage were encountered.  These slowly cleared,  revealing the underlying attached retina without extensive proliferative  retinopathy.  The old laser photocoagulation scars were present, but  numerous areas of gaps were noted.  It was then elected to perform  additional panretinal laser endophotocoagulation.  A total of 1200  approximate spots were applied using an 800 mW power at 0.1 second time.  Satisfactory burns were achieved, and there were no additional retinal  hemorrhages.  Indirect ophthalmoscopy was carried out intermittently and  revealed no peripheral retinal tears or detachment.  It was then elected to  close.  The vitreous instruments were withdrawn and the sclerotomy sites  closed with a single 7-0 Vicryl suture.  The conjunctiva was then pulled  forward and closed with a running 7-0 Vicryl suture.  Depot Garamycin and  dexamethasone were injected in the sub-Tenon's space inferiorly.  Maxitrol  and atropine ointment were instilled in  the conjunctival cul-de-sac.  A light patch and protective shield were  applied.  Duration of procedure:  One to 1-1/2 hours.  The patient tolerated  the procedure well in general, left the operating room for the recovery room  and subsequently to the 23-hour observation unit.  HNJ/MEDQ  D:  08/21/2004  T:  08/21/2004  Job:  [id number]   cc:   Veverly Fells. Altheimer, M.D.  [address]  [place]  [place] [address]  Fax: [phone]   Cassell Clement, M.D.  [address], [address]  [place]  [place] [address]  Fax: [phone]

## 2011-03-22 NOTE — H&P (Signed)
[name], [name]                  ACCOUNT NO.:  [id number]   MEDICAL RECORD NO.:  [id number]          PATIENT TYPE:  OIB   LOCATION:  5731                         FACILITY:  [facility]   PHYSICIAN:  Guadelupe Sabin, M.D.DATE OF BIRTH:  1931-08-26   DATE OF ADMISSION:  08/21/2004  DATE OF DISCHARGE:                                HISTORY & PHYSICAL   REASON FOR ADMISSION:  This was a planned outpatient surgical admission of  this 77 year old white female insulin-dependent diabetic admitted with a  chronic vitreous hemorrhage of the right eye.   PRESENT ILLNESS:  This patient was first seen in my office on August 25, 2000 at age 80 for a routine ocular examination.  The patient was  complaining of redness, wateriness, burning of the eye, and blurred vision  especially at reading, and some pain in each eye.  The patient related that  she had had previous cataract implant surgery by Dr. Linward Foster.  Initial  examination revealed a visual acuity of 20/30 without correction and 20/30  right eye, 20/25 left eye with correction.  This was not improved with  refraction.  Slit lamp examination showed the eyes to be white and clear  with a clear cornea, deep and clear anterior chamber, a posterior chamber  implant, and a slightly hazy posterior capsule.  Pupillary reactions and  ocular motility were normal.  Dilated fundus examination revealed a hazy  posterior capsule.  The vitreous appeared clear, the retina attached, optic  nerve sharply outlined, good color, disc/cup ratio 0.3.  A small, 2-disc-  diameter, flat choroidal nevus was noted in the inferior temporal quadrant.  There was no associated elevation or retinal detachment.  The left eye  revealed a hazy posterior capsule with a clear vitreous, attached retina.  The optic nerve was sharply outlined, good color, disc/cup ratio 0.3.  The  blood vessels and macula appeared normal.  It was my impression at that time  the patient had a  hazy posterior capsule and warranted YAG laser  capsulotomy.  This was performed in my office without complication:  Right  eye September 29, 2000; left eye May 05, 2001.  Later, the patient was noted  to have the onset of background diabetic retinopathy and some associated  vitreous hemorrhage of the right eye.  Scattered microaneurysms and  hemorrhages were noted in the right eye.  It was then elected to perform  laser photocoagulation to the right eye on July 07, 2003.  This was  again performed without complication and the patient followed.  The  hemorrhage was moderate but troublesome to the patient.  This cleared  significantly, but recurred approximately 1 year later on August 07, 2004  when she suddenly lost the vision in the right eye completely with finger-  counting only.  A dense vitreous hemorrhage was seen but no retinal tear or  detachment.  B-scan ultrasonography showed the retina to be in place.  Over  the ensuing weeks, the patient's vitreous hemorrhage seemed to recur and did  not improve, and remained at finger-counting.  It was  therefore felt that  the patient warranted a vitrectomy surgery.  The patient was given oral  discussion and printed information concerning the procedure and its possible  complications.  She signed an informed consent and arrangements were made  for her outpatient admission at this time.   PAST MEDICAL HISTORY:  The patient is under the care of Dr. Leslie Dales for  her endocrinology, and Dr. Ronny Flurry for her cardiac problems.  The  patient stated that her blood sugars were running fairly well with a good  A1c level.  Dr. Leslie Dales felt that the patient was in satisfactory  condition for the proposed surgery.  Dr. Patty Sermons concurred.  She therefore  is admitted at this time for the vitreoretinal surgery of the right eye.   REVIEW OF SYSTEMS:  No current cardiorespiratory complaints.   PHYSICAL EXAMINATION:  VITAL SIGNS:  As recorded on  admission, blood  pressure 139/65, pulse 71, respirations 18, temperature 97.1  GENERAL APPEARANCE:  The patient is a pleasant, well-nourished, well-  developed white female in acute ocular distress.  HEENT:  Eyes:  Ocular exam as noted above.  CHEST:  Lungs clear to percussion and auscultation.  HEART:  Normal sinus rhythm, no cardiomegaly, no murmurs.  ABDOMEN:  Negative.  EXTREMITIES:  Negative.   ADMISSION DIAGNOSES:  1.  Chronic and recurrent vitreous hemorrhage, right eye.  2.  Diabetes mellitus, insulin-dependent type, with probable proliferative      diabetic retinopathy and background retinopathy, right eye.  3.  Status post previous laser photocoagulation, right eye.       HNJ/MEDQ  D:  08/21/2004  T:  08/21/2004  Job:  [id number]   cc:   Veverly Fells. Altheimer, M.D.  [address], [address]  [place]  [place] [address]  Fax: [phone]   Cassell Clement, M.D.  [address], [address]  [place]  [place] [address]  Fax: [phone]

## 2011-03-22 NOTE — H&P (Signed)
NAME:  [name], Bracco                            ACCOUNT NO.:  [id number]   MEDICAL RECORD NO.:  [id number]                   PATIENT TYPE:  OIB   LOCATION:                                       FACILITY:  [facility]   PHYSICIAN:  Vesta Mixer, M.D.              DATE OF BIRTH:  [birth year]   DATE OF ADMISSION:  04/25/2003  DATE OF DISCHARGE:                                HISTORY & PHYSICAL   INTRODUCTION:  [name] is a 77-year-old female with a history of diabetes  mellitus and coronary artery disease.  She had moderate coronary artery  disease by heart catheterization in May of 2000.  Recently she has developed  some chest pain and shortness of breath with exertion.  She denies that she  has chest tightness any time she climbs stairs.  She was recently seen by  Dr. Randel Pigg and had  reversible apical aphemia.  We are asked to see her  for further evaluation and for consideration for heart catheterization.   [name] has had these episodes of chest pains for several years.  She  thinks that they may be slowly worsening.  She has also had a cough and some  URI symptoms and is on an antibiotic.   CURRENT MEDICATIONS:  Norvasc 10 mg a day, Glucotrol 5 mg a day, Cardura 4  mg a day, Lipitor 20 mg a day, Lasix 40 mg a day, Prinivil 40 mg a day, baby  aspirin once a day, Actos 45 mg a day, Toprol XL 50 mg a day, Lodine with  __________ 10 units q.h.s., Novolog sliding scale.   ALLERGIES:  She is allergic to SULFA which causes nausea and vomiting, and  CODEINE which causes her to act crazy.   PAST MEDICAL HISTORY:  1. Diabetes mellitus.  2. Moderate coronary artery disease.  3. Hypertension.  4. Obesity.   FAMILY HISTORY:  Her father died at age 75 due to a heart attack.  Her  mother had a stroke at age 25.  She does not smoke and does not drink  alcohol.   REVIEW OF SYSTEMS:  Reviewed and is essentially negative.   PHYSICAL EXAMINATION:  On exam, she is a 77-year-old female in  no acute  distress.  She is alert and oriented x3 and her mood and affect are normal.  Her weight is 206, her blood pressure is 120/70 with a heart rate of 68.  HEENT:  2+ carotid.  She has no bruits.  There is not JVD, no thyromegaly.  LUNG EXAM:  Reveals diffuse wheezing with the right worse than the left.  HEART:  Regular rate, S1, S2, with no murmurs, rubs or gallops.  ABDOMEN:  Good bowel sounds, nontender.  EXTREMITIES:  She has no clubbing, cyanosis or edema.  NEURO EXAM:  Nonfocal.   ASSESSMENT AND PLAN:  [name] presents for symptoms consistent  with angina.  She also has some wheezing.  I would like to give her Imdur 30 mg a day as  well as a prescription for nitroglycerine.  I have also given  her a Combivent inhaler with a prescription for albuterol.  We will schedule  her for a heart catheterization next week.  We have discussed the risks,  benefits and options of heart catheterization.  She understands and agrees  to proceed.  All of her other medical problems are stable.                                                Vesta Mixer, M.D.    PJN/MEDQ  D:  04/11/2003  T:  04/11/2003  Job:  [id number]   cc:   Veverly Fells. Altheimer, M.D.  [address]  [place]  [place] [address]  Fax: [phone]

## 2011-03-22 NOTE — Op Note (Signed)
[name], [name]                  ACCOUNT NO.:  [id number]   MEDICAL RECORD NO.:  [id number]          PATIENT TYPE:  OIB   LOCATION:  5021                         FACILITY:  [facility]   PHYSICIAN:  Sharolyn Douglas, M.D.        DATE OF BIRTH:  May 19, 1931   DATE OF PROCEDURE:  02/13/2006  DATE OF DISCHARGE:                                 OPERATIVE REPORT   DIAGNOSES:  1.  Extruded L4-5 disk herniation x2 with compression of the left L4 nerve      root and right L5 nerve root.  2.  Degenerative lumbar spondylolisthesis L3-4 with underlying spinal      stenosis status post previous laminectomy.   PROCEDURE:  1.  Revision L3-4 and L4-5 lumbar laminectomy with wide decompression of the      thecal sac and nerve roots bilaterally.  2.  Removal of extruded disk herniation on the left compressing the L4 nerve      and on the right compressing the L5 nerve.   SURGEON:  Sharolyn Douglas, MD   ASSISTANT:  Verlin Fester, P.A.   ANESTHESIA:  General endotracheal.   COMPLICATIONS:  None.   ESTIMATED BLOOD LOSS:  100 mL.   INDICATIONS:  The patient is a pleasant 77 year old female, history of  previous lumbar laminectomy at L3-4 with underlying degenerative  spondylolisthesis.  She developed worsening of her chronic pain.  She has  had problems with left leg weakness, but over the past several months  developed acute pain and progressive weakness in the left leg and also some  in the right.  Her radiographs show a chronic-appearing degenerative  spondylolisthesis at L3-4.  The MRI scan demonstrated extruded disk  herniations which appeared to come from the L4-5 disk which had migrated on  the left side cephalad and on the right side caudad.  She has failed to  respond to conservative treatment, now elects to undergo laminectomy in  hopes of improving her symptoms.  Risk and benefits were reviewed.   PROCEDURE:  She was identified in holding area and taken to the operating  room.  She underwent general  orotracheal anesthesia without difficulty given  prophylactic IV antibiotics.  Carefully turned prone on the Wilson frame.  All bony prominences padded.  Face eyes protected at all times.  Back  prepped, draped usual sterile fashion.  Previous midline incision was  utilized and extended several centimeters distally.  Dissection was carried  to the previous scar.  Care was taken not to inadvertently enter the  laminectomy at L3-4 which was previously done by another surgeon.  The  anatomy was exposed.  Intraoperative x-ray taken to confirm levels.  There  did not appear to be significant motion occurring between the L3-4 or L4-5  spinous processes.  Deep retractors were placed.  We then began a  laminectomy by removing the entire spinous process of L4 and the underlying  lamina.  The residual lamina of L3 was removed by carefully dissecting  around the previous laminectomy with curettes.  We used loupes and headlight  magnification.  The decompression was widened flush with the pedicles.  We  then identified the L4 and L5 nerve roots bilaterally.  On the left the L4  nerve root was being displaced by a large extruded disk herniation.  We  dissected the nerve root off of all for the herniation and then removed as  multiple fragments using a micro pituitary.  Hemostasis was achieved with  bipolar electrocautery and Gelfoam.  When we were done, we felt we had a  good decompression of the L4 nerve root.  We then turned our attention to  the right side.  We found a second herniated disk which had migrated  distally almost down to the L5 pedicle.  This was compressing the right L5  nerve root by mobilizing that root medially.  We again could remove the  herniation from the spinal canal.  We copiously irrigated.  We felt that the  L4 and L5 nerve roots were decompressed bilaterally.  The thecal sac was  widely free.  The deep fascia was closed with a #1 Vicryl suture.  Subcutaneous layer closed  with 0 Vicryl followed by 2-0 Vicryl.  A  subcuticular Vicryl suture was then utilized to approximate the skin edges.  Benzoin, Steri-Strips placed.  Sterile dressing applied.  Needle and sponge  count correct.  The patient was extubated without difficulty and transferred  to recovery stable condition able to move her upper and lower extremities.      Sharolyn Douglas, M.D.  Electronically Signed     MC/MEDQ  D:  02/13/2006  T:  02/14/2006  Job:  [id number]

## 2011-03-22 NOTE — Op Note (Signed)
[place]  Patient:    [name], [name]                      MRN: [id number] Adm. Date:  [id number] Disc. Date: [id number] Attending:  Skip Mayer CC:         Veverly Fells. Altheimer, M.D.             Darden Palmer., M.D.                           Operative Report  PREOPERATIVE DIAGNOSES: 1. Large herniated lumbar disc at L3-4 which was foraminal and extra foraminal    on the left. 2. Small synovial cyst of the facet joint at 3-4 on the left.  POSTOPERATIVE DIAGNOSES: 1. Large herniated lumbar disc at L3-4 which was foraminal and extra foraminal    on the left. 2. Small synovial cyst of the facet joint at 3-4 on the left.  OPERATION: 1. Microdiskectomy at L3-4 on the left. 2. Partial facetectomy at 3-4 on the left. 3. Removal of synovial cyst on the left L3-4.  SURGEON:  Georges Lynch. Gioffre, M.D.  DESCRIPTION OF PROCEDURE:  Under general anesthesia, routine orthopaedic prep and drape in the lower back carried out.  With the patient on a spinal frame she had 1 gram of IV Ancef. Two needles were placed in the lower back, x-ray was taken for verification purposes. Following this, after we identified the L3-4 space an incision was made. Another x-ray was taken with markers in place to verify position once again.  Following this, we went down and identified the lamina of L3-4.  We had to utilize the bur to bur down the lateral aspect of the facet in order to go out laterally to locate the foraminal disc. Once this was done, we then carried out a hemilaminectomy in the usual fashion. We identified the nerve root and protected the root.  We cauterized the lateral recess veins.  Great care was taken not to injure the root.  We did utilize the microscope during the procedure.  We then went down and identified the disc. A cruciate incision was made in the posterior longitudinal ligament and a complete diskectomy was carried out. Went out  far laterally as well and removed a large piece of disc from the lateral disc space as well.  Once the nerve root was completely freed and we felt that the disc was totally removed, we thoroughly irrigated out the area, dried the area out and injected 2 cc of Fentanyl down over the root.  This was not injected into the root, but just actually applied over the root.  We then closed the wound in layers in the usual fashion.  Sterile dressings were applied.  The patient left the operating room in satisfactory condition. DD:  03/07/00 TD:  03/11/00 Job: [id number]

## 2011-03-22 NOTE — Cardiovascular Report (Signed)
[name], [name]                            ACCOUNT NO.:  [id number]   MEDICAL RECORD NO.:  [id number]                   PATIENT TYPE:  OIB   LOCATION:  2899                                 FACILITY:  [facility]   PHYSICIAN:  Vesta Mixer, M.D.              DATE OF BIRTH:  24-Sep-1931   DATE OF PROCEDURE:  04/25/2003  DATE OF DISCHARGE:                              CARDIAC CATHETERIZATION   PROCEDURE:  Left heart catheterization with coronary angiography.   INDICATIONS:  The patient is a 77 year old female with a history of moderate  coronary artery disease by heart catheterization in 2000.  She presents with  some episodes of chest pain.  She had a stress Cardiolite study which  revealed some anterior ischemia and is referred for heart catheterization.   PROCEDURE:  The right femoral artery was easily cannulated using a modified  Seldinger technique.   HEMODYNAMICS:  The left ventricular pressure is 131/15 with an aortic  pressure of 131/58.   ANGIOGRAPHY:  The left main coronary artery is smooth and normal.   The left anterior descending artery has a few irregularities proximally.  The mid vessel has a 30-40% stenosis.  The distal LAD has moderate diffuse  irregularities, but no critical stenosis.   The first diagonal vessel is a relatively small vessel.  There is a 70-80%  stenosis at its takeoff.  The second diagonal vessel is also a small vessel,  but has a very tiny branch that is subtotally occluded.  The first diagonal  vessel is not large enough to consider angioplasty.   The left circumflex artery is a moderate sized vessel.  It gives off a large  first obtuse marginal artery which is essentially normal.  The distal  circumflex artery is unremarkable.   The right coronary artery is large and dominant.  There is a fusiform 20-25%  stenosis in the mid vessel.  The distal vessel has a few minor luminal  irregularities.  The posterior descending artery and the  posterolateral  segment arteries are unremarkable.   LEFT VENTRICULOGRAM:  The left ventriculogram was performed in a 30 RAO  position.  It reveals overall well preserved left ventricular systolic  function.  The ejection fraction is around 65%.  There is no mitral  regurgitation.   COMPLICATIONS:  None.   CONCLUSION:  Mild to moderate coronary artery disease.  The primarily  stenoses are in the very small diagonal branches which are not candidates  for angioplasty due to their very small size.  It is unlikely that these are  causing significant ischemia.  We will continue with medical therapy.  She  has normal left ventricular systolic function.  Vesta Mixer, M.D.    PJN/MEDQ  D:  04/25/2003  T:  04/25/2003  Job:  [id number]  Veverly Fells. Altheimer, M.D.  [address]  [place]  [place] [address]  Fax: [phone]   Cassell Clement, M.D.  [address]  [place]  [place] [address]  Fax: [phone]   cc:   Veverly Fells. Altheimer, M.D.  [address], [address]  [place]  [place] [address]  Fax: [phone]   Cassell Clement, M.D.  [address]  [place]  [place] [address]  Fax: [phone]

## 2011-04-17 ENCOUNTER — Telehealth: Payer: Self-pay | Admitting: *Deleted

## 2011-04-17 DIAGNOSIS — I2510 Atherosclerotic heart disease of native coronary artery without angina pectoris: Secondary | ICD-10-CM

## 2011-04-17 DIAGNOSIS — E785 Hyperlipidemia, unspecified: Secondary | ICD-10-CM

## 2011-04-17 DIAGNOSIS — E119 Type 2 diabetes mellitus without complications: Secondary | ICD-10-CM

## 2011-04-17 DIAGNOSIS — I10 Essential (primary) hypertension: Secondary | ICD-10-CM

## 2011-04-17 NOTE — Telephone Encounter (Signed)
Scheduled lexiscan 6/19 for 8:45.  Advised patient

## 2011-04-23 ENCOUNTER — Ambulatory Visit (HOSPITAL_COMMUNITY): Payer: Medicare Other | Attending: Cardiology | Admitting: Radiology

## 2011-04-23 ENCOUNTER — Other Ambulatory Visit (HOSPITAL_COMMUNITY): Payer: Medicare Other | Admitting: Radiology

## 2011-04-23 DIAGNOSIS — R0602 Shortness of breath: Secondary | ICD-10-CM

## 2011-04-23 DIAGNOSIS — E785 Hyperlipidemia, unspecified: Secondary | ICD-10-CM | POA: Insufficient documentation

## 2011-04-23 DIAGNOSIS — I2510 Atherosclerotic heart disease of native coronary artery without angina pectoris: Secondary | ICD-10-CM | POA: Insufficient documentation

## 2011-04-23 DIAGNOSIS — I10 Essential (primary) hypertension: Secondary | ICD-10-CM | POA: Insufficient documentation

## 2011-04-23 DIAGNOSIS — E119 Type 2 diabetes mellitus without complications: Secondary | ICD-10-CM

## 2011-04-23 DIAGNOSIS — R0989 Other specified symptoms and signs involving the circulatory and respiratory systems: Secondary | ICD-10-CM

## 2011-04-23 MED ORDER — TECHNETIUM TC 99M TETROFOSMIN IV KIT
11.0000 | PACK | Freq: Once | INTRAVENOUS | Status: AC | PRN
  Administered 2011-04-23: 11 via INTRAVENOUS

## 2011-04-23 MED ORDER — REGADENOSON 0.4 MG/5ML IV SOLN
0.4000 mg | Freq: Once | INTRAVENOUS | Status: AC
  Administered 2011-04-23: 0.4 mg via INTRAVENOUS

## 2011-04-23 MED ORDER — TECHNETIUM TC 99M TETROFOSMIN IV KIT
33.0000 | PACK | Freq: Once | INTRAVENOUS | Status: AC | PRN
  Administered 2011-04-23: 33 via INTRAVENOUS

## 2011-04-23 NOTE — Progress Notes (Signed)
[facility] SITE 3 NUCLEAR MED [address] [place] [address] [phone]  Cardiology Nuclear Med Vadie Principato is a 77 y.o. female [id number] 04-03-1931   Nuclear Med Background Indication for Stress Test:  Evaluation for Ischemia and Pending Back surgery; 05/02/11; Dr. Venetia Maxon History: 11/10 Echo: 60-65% mild AS, '04 Heart Catheterization: mild to moderate distal CAD and 03/07 Myocardial Perfusion Study EF 70% (-) ischemia Cardiac Risk Factors: Family History - CAD, Hypertension, IDDM Type 2 and Lipids  Symptoms:  DOE, Fatigue and SOB   Nuclear Pre-Procedure Caffeine/Decaff Intake:  None NPO After: 7:30am   Lungs:  clear IV 0.9% NS with Angio Cath:  20g  IV Site: R Forearm  IV Started by:  Stanton Kidney, EMT-P  Chest Size (in):  42  Cup Size: C  Height: 5\' 4"  (1.626 m)  Weight:  210 lb (95.255 kg)  BMI:  Body mass index is 36.05 kg/(m^2). Tech Comments:  Toprol held this am, CBG=99 @ 7:30 am, per patient.    Nuclear Med Study 1 or 2 day study: 1 day  Stress Test Type:  Eugenie Birks  Reading MD: Laurie Rough, MD  Order Authorizing Provider:  T.Brackbill  Resting Radionuclide: Technetium 45m Tetrofosmin  Resting Radionuclide Dose: 11 mCi   Stress Radionuclide:  Technetium 22m Tetrofosmin  Stress Radionuclide Dose: 33 mCi           Stress Protocol Rest HR: 68 Stress HR: 78  Rest BP: 131/51 Stress BP: 163/58  Exercise Time (min): n/a METS: n/a   Predicted Max HR: 143 bpm % Max HR: 54.55 bpm Rate Pressure Product: 84696   Dose of Adenosine (mg):  n/a Dose of Lexiscan: 0.4 mg  Dose of Atropine (mg): n/a Dose of Dobutamine: n/a mcg/kg/min (at max HR)  Stress Test Technologist: Milana Na, EMT-P  Nuclear Technologist:  Doyne Keel, CNMT     Rest Procedure:  Myocardial perfusion imaging was performed at rest 45 minutes following the intravenous administration of Technetium 37m Tetrofosmin. Rest ECG: NSR  Stress Procedure:  The patient  received IV Lexiscan 0.4 mg over 15-seconds.  Technetium 87m Tetrofosmin injected at 30-seconds.  There were no significant changes with Lexiscan.  Quantitative spect images were obtained after a 45 minute delay. Stress ECG: No significant change from baseline ECG  QPS Raw Data Images:  Patient motion noted; appropriate software correction applied. Stress Images:  No significant abnormalities. Rest Images:  Same as stress Subtraction (SDS):  No evidence of ischemia. Transient Ischemic Dilatation (Normal <1.22):  1.14 Lung/Heart Ratio (Normal <0.45):  .30  Quantitative Gated Spect Images QGS EDV:  56 ml QGS ESV:  9 ml QGS cine images:  Normal Wall Motion QGS EF: 83%  Impression Exercise Capacity:  Lexiscan with no exercise. BP Response:  Normal blood pressure response. Clinical Symptoms:  SOB ECG Impression:  No significant ST segment change suggestive of ischemia. Comparison with Prior Nuclear Study: No images to compare  Overall Impression:  Normal stress nuclear study.   [name]

## 2011-04-29 ENCOUNTER — Other Ambulatory Visit (HOSPITAL_COMMUNITY): Payer: Self-pay | Admitting: Neurosurgery

## 2011-04-29 ENCOUNTER — Encounter (HOSPITAL_COMMUNITY)
Admission: RE | Admit: 2011-04-29 | Discharge: 2011-04-29 | Disposition: A | Discharge status: Home / Self Care | Payer: Medicare Other | Source: Ambulatory Visit | Attending: Neurosurgery | Admitting: Neurosurgery

## 2011-04-29 ENCOUNTER — Ambulatory Visit (HOSPITAL_COMMUNITY)
Admission: RE | Admit: 2011-04-29 | Discharge: 2011-04-29 | Disposition: A | Discharge status: Home / Self Care | Payer: Medicare Other | Source: Ambulatory Visit | Attending: Neurosurgery | Admitting: Neurosurgery

## 2011-04-29 ENCOUNTER — Telehealth: Payer: Self-pay | Admitting: Cardiology

## 2011-04-29 ENCOUNTER — Encounter: Payer: Self-pay | Admitting: *Deleted

## 2011-04-29 DIAGNOSIS — Z01812 Encounter for preprocedural laboratory examination: Secondary | ICD-10-CM | POA: Insufficient documentation

## 2011-04-29 DIAGNOSIS — M5126 Other intervertebral disc displacement, lumbar region: Secondary | ICD-10-CM

## 2011-04-29 DIAGNOSIS — Z01810 Encounter for preprocedural cardiovascular examination: Secondary | ICD-10-CM | POA: Insufficient documentation

## 2011-04-29 DIAGNOSIS — Z01818 Encounter for other preprocedural examination: Secondary | ICD-10-CM | POA: Insufficient documentation

## 2011-04-29 LAB — BASIC METABOLIC PANEL
BUN: 33 mg/dL — ABNORMAL HIGH (ref 6–23)
CO2: 26 mEq/L (ref 19–32)
Calcium: 9 mg/dL (ref 8.4–10.5)
Chloride: 103 mEq/L (ref 96–112)
Creatinine, Ser: 1.43 mg/dL — ABNORMAL HIGH (ref 0.50–1.10)
GFR calc Af Amer: 43 mL/min — ABNORMAL LOW (ref >60)
GFR calc non Af Amer: 36 mL/min — ABNORMAL LOW (ref >60)
Glucose, Bld: 120 mg/dL — ABNORMAL HIGH (ref 70–99)
Potassium: 4.6 mEq/L (ref 3.5–5.1)
Sodium: 139 mEq/L (ref 135–145)

## 2011-04-29 LAB — CBC
HCT: 37.4 % (ref 36.0–46.0)
Hemoglobin: 12 g/dL (ref 12.0–15.0)
MCH: 28.2 pg (ref 26.0–34.0)
MCHC: 32.1 g/dL (ref 30.0–36.0)
MCV: 87.8 fL (ref 78.0–100.0)
Platelets: 237 10*3/uL (ref 150–400)
RBC: 4.26 MIL/uL (ref 3.87–5.11)
RDW: 14.4 % (ref 11.5–15.5)
WBC: 7.8 10*3/uL (ref 4.0–10.5)

## 2011-04-29 LAB — SURGICAL PCR SCREEN
MRSA, PCR: NEGATIVE
Staphylococcus aureus: NEGATIVE

## 2011-04-29 LAB — TYPE AND SCREEN
ABO/RH(D): A POS
Antibody Screen: NEGATIVE

## 2011-04-29 NOTE — Telephone Encounter (Signed)
Advised of stress test and ok for surgery per Dr Patty Sermons

## 2011-04-29 NOTE — Telephone Encounter (Signed)
Darl Pikes at Dr.Stern's office called and said that she sent over a request for surgical clearance on 04/09/11.  She wanted to know if we have received it and if the patient is clear for surgery.

## 2011-04-30 ENCOUNTER — Encounter: Payer: Self-pay | Admitting: Cardiology

## 2011-05-02 ENCOUNTER — Inpatient Hospital Stay (HOSPITAL_COMMUNITY)
Admission: RE | Admit: 2011-05-02 | Discharge: 2011-05-09 | DRG: 458 | Disposition: A | Discharge status: Skilled Nursing Facility | Payer: Medicare Other | Source: Ambulatory Visit | Attending: Neurosurgery | Admitting: Neurosurgery

## 2011-05-02 ENCOUNTER — Inpatient Hospital Stay (HOSPITAL_COMMUNITY): Payer: Medicare Other

## 2011-05-02 DIAGNOSIS — E119 Type 2 diabetes mellitus without complications: Secondary | ICD-10-CM | POA: Diagnosis present

## 2011-05-02 DIAGNOSIS — Z7982 Long term (current) use of aspirin: Secondary | ICD-10-CM

## 2011-05-02 DIAGNOSIS — M47817 Spondylosis without myelopathy or radiculopathy, lumbosacral region: Secondary | ICD-10-CM | POA: Diagnosis present

## 2011-05-02 DIAGNOSIS — I10 Essential (primary) hypertension: Secondary | ICD-10-CM | POA: Diagnosis present

## 2011-05-02 DIAGNOSIS — R339 Retention of urine, unspecified: Secondary | ICD-10-CM | POA: Diagnosis present

## 2011-05-02 DIAGNOSIS — Z794 Long term (current) use of insulin: Secondary | ICD-10-CM

## 2011-05-02 DIAGNOSIS — M4120 Other idiopathic scoliosis, site unspecified: Principal | ICD-10-CM | POA: Diagnosis present

## 2011-05-02 DIAGNOSIS — I509 Heart failure, unspecified: Secondary | ICD-10-CM | POA: Diagnosis present

## 2011-05-02 DIAGNOSIS — M4310 Spondylolisthesis, site unspecified: Secondary | ICD-10-CM | POA: Diagnosis present

## 2011-05-02 LAB — GLUCOSE, CAPILLARY
Glucose-Capillary: 145 mg/dL — ABNORMAL HIGH (ref 70–99)
Glucose-Capillary: 236 mg/dL — ABNORMAL HIGH (ref 70–99)
Glucose-Capillary: 188 mg/dL — ABNORMAL HIGH (ref 70–99)

## 2011-05-03 LAB — GLUCOSE, CAPILLARY
Glucose-Capillary: 149 mg/dL — ABNORMAL HIGH (ref 70–99)
Glucose-Capillary: 151 mg/dL — ABNORMAL HIGH (ref 70–99)
Glucose-Capillary: 177 mg/dL — ABNORMAL HIGH (ref 70–99)
Glucose-Capillary: 173 mg/dL — ABNORMAL HIGH (ref 70–99)
Glucose-Capillary: 143 mg/dL — ABNORMAL HIGH (ref 70–99)

## 2011-05-04 LAB — BASIC METABOLIC PANEL
BUN: 14 mg/dL (ref 6–23)
CO2: 23 mEq/L (ref 19–32)
Calcium: 8.9 mg/dL (ref 8.4–10.5)
Chloride: 104 mEq/L (ref 96–112)
Creatinine, Ser: 1.24 mg/dL — ABNORMAL HIGH (ref 0.50–1.10)
GFR calc Af Amer: 51 mL/min — ABNORMAL LOW (ref >60)
GFR calc non Af Amer: 42 mL/min — ABNORMAL LOW (ref >60)
Glucose, Bld: 127 mg/dL — ABNORMAL HIGH (ref 70–99)
Potassium: 4.3 mEq/L (ref 3.5–5.1)
Sodium: 135 mEq/L (ref 135–145)

## 2011-05-04 LAB — GLUCOSE, CAPILLARY
Glucose-Capillary: 140 mg/dL — ABNORMAL HIGH (ref 70–99)
Glucose-Capillary: 141 mg/dL — ABNORMAL HIGH (ref 70–99)
Glucose-Capillary: 55 mg/dL — ABNORMAL LOW (ref 70–99)
Glucose-Capillary: 60 mg/dL — ABNORMAL LOW (ref 70–99)
Glucose-Capillary: 100 mg/dL — ABNORMAL HIGH (ref 70–99)
Glucose-Capillary: 124 mg/dL — ABNORMAL HIGH (ref 70–99)

## 2011-05-05 LAB — GLUCOSE, CAPILLARY
Glucose-Capillary: 82 mg/dL (ref 70–99)
Glucose-Capillary: 140 mg/dL — ABNORMAL HIGH (ref 70–99)
Glucose-Capillary: 89 mg/dL (ref 70–99)

## 2011-05-06 LAB — GLUCOSE, CAPILLARY
Glucose-Capillary: 96 mg/dL (ref 70–99)
Glucose-Capillary: 78 mg/dL (ref 70–99)
Glucose-Capillary: 142 mg/dL — ABNORMAL HIGH (ref 70–99)
Glucose-Capillary: 106 mg/dL — ABNORMAL HIGH (ref 70–99)

## 2011-05-07 LAB — GLUCOSE, CAPILLARY
Glucose-Capillary: 92 mg/dL (ref 70–99)
Glucose-Capillary: 140 mg/dL — ABNORMAL HIGH (ref 70–99)
Glucose-Capillary: 208 mg/dL — ABNORMAL HIGH (ref 70–99)
Glucose-Capillary: 137 mg/dL — ABNORMAL HIGH (ref 70–99)

## 2011-05-07 NOTE — Op Note (Signed)
[name], [name]                  ACCOUNT NO.:  [id number]  MEDICAL RECORD NO.:  [id number]  LOCATION:  3033                         FACILITY:  [facility]  PHYSICIAN:  Danae Orleans. Venetia Maxon, M.D.  DATE OF BIRTH:  12-Jan-1931  DATE OF PROCEDURE:  05/02/2011 DATE OF DISCHARGE:                              OPERATIVE REPORT   PREOPERATIVE DIAGNOSIS:  Lumbar scoliosis, spondylolisthesis, spondylosis, stenosis, and lumbar radiculopathy L3-4, L4-5 levels.  POSTOPERATIVE DIAGNOSIS:  Lumbar scoliosis, spondylolisthesis, spondylosis, stenosis, and lumbar radiculopathy L3-4, L4-5 levels.  PROCEDURE: 1. Anterolateral decompression and fusion L3-4 and L4-5 levels with     PEEK interbody cages, morselized bone allograft. 2. Posterior pedicle screw fixation through percutaneous screws L3-L5     levels bilaterally.  SURGEON:  Danae Orleans. Venetia Maxon, MD  ASSISTANT:  Clydene Fake, MD  ANESTHESIA:  General endotracheal anesthesia.  ESTIMATED BLOOD LOSS:  Less than 200 mL.  COMPLICATIONS:  None.  DISPOSITION:  Recovery.  INDICATIONS:  [name] is a 77 year old woman with severe back bilateral lower extremity pain right greater than left who has had a previous lumbar laminectomy, has spondylolisthesis, scoliosis, and spondylosis with stenosis and it was elected to take her to surgery for anterolateral decompression and fusion at these affected levels.  PROCEDURE:  [name] was brought to the operating room.  Following a satisfactory and uncomplicated induction of general endotracheal anesthesia plus intravenous lines, Foley catheter and monitoring electrodes for neural monitoring, she was placed in a left lateral decubitus position with axillary roll.  C-arm fluoroscopy was used to localize the L4-5 and L3-4 levels on AP and lateral views.  After the patient was taped into position, the table was flexed, the L4-5 level was identified, the areas of planned incision were marked at L3-4 and L4- 5  level, with a posterior retroperitoneal finger dissection incision marked also.  After prepping and draping in usual sterile fashion, the incisions were opened.  Posterior finger dissection was performed.  The lateral incision overlying just cephalad to the iliac crest was performed and using a neuro navigation probe initially a twitch test was performed in the psoas muscle and subsequently the probe was docked on the interspace at the L4-5 level.  It was very difficult to be able to avoid the neural elements.  We ended up docking quite anteriorly over the interspace and at this location did not have excessive stimulation of the neural elements but did have clear proximity of these neural elements posteriorly.  Subsequently, engaged the Steinmann pin within the interspace, used sequential dilators and then placed the minimally invasive retractor.  With direct stimulation, there was no evidence of any neural elements within the field and the interspace was incised, disk material was removed and using sequentially larger distractors eventually the interspace was opened to place an 8-mm cage.  This was packed with Osteocel Plus and a 45-mm cage was placed because we were slightly more anterior than typical and then tamped into position and countersunk appropriately.  Attention was then turned to the L3-4 level where similar exposure was performed.  Again, there was fairly close proximity of neural elements posteriorly and again we docked anterior at the  level of the midline and the implant more posteriorly.  Thorough diskectomy was performed at this level and after trial sizing an 8-mm similarly sized implant was placed and tamped in position and countersunk appropriately.  The distractor was removed.  Final x-rays demonstrated a well-positioned interbody grafts and pedicle screw both on AP and lateral plane.  The fascia was closed with 0 Vicryl sutures, subcutaneous tissues were  reapproximated with 2-0 Vicryl interrupted inverted sutures and skin edges were reapproximated with 3-0 Vicryl subcuticular stitch.  The wound was dressed with Dermabond.  The patient was then turned to a prone position on chest rolls and using the AP and lateral fluoroscopy with percutaneous pedicle screws, the 6.5 x 45 mm screws were placed at L3, L4, and L5 bilaterally, and then 70-mm preloaded rods were affixed to the screw heads and locked down in situ. All screws had excellent purchase and their position was confirmed on AP and lateral fluoroscopy.  The screw heads were locked down without difficulty.  Hemostasis was assured.  The wounds were then closed with 1, 2-0 and 3-0 Vicryl sutures and sterile occlusive dressing was placed with Dermabond.  The patient was extubated in the operating room and taken to recovery in stable satisfactory condition having tolerated the operation well.  Counts were correct at the end of the case.     Danae Orleans. Venetia Maxon, M.D.     JDS/MEDQ  D:  05/02/2011  T:  05/03/2011  Job:  [id number]  Electronically Signed by Maeola Harman M.D. on 05/07/2011 08:50:24 AM

## 2011-05-08 LAB — GLUCOSE, CAPILLARY
Glucose-Capillary: 74 mg/dL (ref 70–99)
Glucose-Capillary: 154 mg/dL — ABNORMAL HIGH (ref 70–99)
Glucose-Capillary: 158 mg/dL — ABNORMAL HIGH (ref 70–99)

## 2011-05-09 LAB — GLUCOSE, CAPILLARY
Glucose-Capillary: 145 mg/dL — ABNORMAL HIGH (ref 70–99)
Glucose-Capillary: 75 mg/dL (ref 70–99)
Glucose-Capillary: 148 mg/dL — ABNORMAL HIGH (ref 70–99)
Glucose-Capillary: 132 mg/dL — ABNORMAL HIGH (ref 70–99)

## 2011-05-10 NOTE — Discharge Summary (Signed)
  [name], [name]                  ACCOUNT NO.:  [id number]  MEDICAL RECORD NO.:  [id number]  LOCATION:  3033                         FACILITY:  [facility]  PHYSICIAN:  Danae Orleans. Venetia Maxon, M.D.  DATE OF BIRTH:  1931/05/13  DATE OF ADMISSION:  05/02/2011 DATE OF DISCHARGE:  05/09/2011                              DISCHARGE SUMMARY   REASON FOR ADMISSION: 1. Lumbar scoliosis. 2. Spondylolisthesis. 3. Spondylosis. 4. Stenosis. 5. And lumbar radiculopathy.  ADDITIONAL DIAGNOSES AND COMORBIDITIES: 1. Diabetes mellitus (insulin dependent). 2. Hypertension. 3. Morbid obesity.  FINAL DIAGNOSES: 1. Diabetes mellitus (insulin dependent). 2. Hypertension. 3. Morbid obesity.  HISTORY OF ILLNESS AND HOSPITAL COURSE:  [name] is a 77 year old woman with bilateral lower extremity and low back pain right greater than left, grossly disabled, undergoing laminectomy and has developed spondylolisthesis, scoliosis, spondylosis, and stenosis.  It was elected to admit her to hospital on same-day procedure basis for anterolateral decompression fusion at L3-L4 and L4-L5 levels with percutaneous pedicle screw fixation.  She did well after a surgery, was somewhat nauseated, difficult to mobilize, and did have some initial operative soreness. She also had discomfort into her left leg which appeared to be consistent with muscle spasm.  She was doing well as of being fixed and arranged for skilled nursing transfer for inpatient rehabilitation and the patient was then transferred there with the instructions to often mobilize in the back brace and to follow up in our office in 3-4 weeks postoperatively with radiographs.  MEDICATIONS:  Her medications at the time of discharge include, 1. Tylenol PM as needed at night. 2. Vitamin D2 50000 units weekly. 3. Iron OTC 1 tablet daily. 4. Calcium carbonate/vitamin D 1 tablet daily. 5. Hydrocodone APAP 5/325 one every 6 hours as needed. 6. Lipitor 40 mg  daily. 7. Actos 15 mg daily. 8. Metoprolol tartrate 25 mg twice daily. 9. Diovan 320 mg daily. 10.Amlodipine 10 mg daily. 11.Aspirin 81 mg daily. 12.Furosemide 80 mg once daily. 13.Glipizide XL 5 mg daily. 14.NovoLog. 15.Aspart, insulin 3-5 units 3 times daily as needed with meals. 16.Lantus insulin Glargine 22 units every evening. 17.The patient was also started on Lyrica 50 mg at bedtime. 18.Valium 2 mg 1-2 every 6 hours if needed for pain.     Danae Orleans. Venetia Maxon, M.D.     JDS/MEDQ  D:  05/09/2011  T:  05/09/2011  Job:  [id number]  Electronically Signed by Maeola Harman M.D. on 05/10/2011 09:20:58 AM

## 2011-05-17 ENCOUNTER — Emergency Department (HOSPITAL_COMMUNITY)
Admission: EM | Admit: 2011-05-17 | Discharge: 2011-05-17 | Discharge status: Against Medical Advice | Payer: Medicare Other | Attending: Emergency Medicine | Admitting: Emergency Medicine

## 2011-05-17 DIAGNOSIS — G8918 Other acute postprocedural pain: Secondary | ICD-10-CM | POA: Insufficient documentation

## 2011-05-17 DIAGNOSIS — E119 Type 2 diabetes mellitus without complications: Secondary | ICD-10-CM | POA: Insufficient documentation

## 2011-05-17 DIAGNOSIS — I509 Heart failure, unspecified: Secondary | ICD-10-CM | POA: Insufficient documentation

## 2011-05-17 DIAGNOSIS — I10 Essential (primary) hypertension: Secondary | ICD-10-CM | POA: Insufficient documentation

## 2011-05-17 DIAGNOSIS — M545 Low back pain, unspecified: Secondary | ICD-10-CM | POA: Insufficient documentation

## 2011-07-15 ENCOUNTER — Ambulatory Visit: Payer: Medicare Other | Admitting: Cardiology

## 2011-08-15 ENCOUNTER — Ambulatory Visit: Payer: Medicare Other | Admitting: Cardiology

## 2011-09-23 ENCOUNTER — Ambulatory Visit (INDEPENDENT_AMBULATORY_CARE_PROVIDER_SITE_OTHER): Payer: Medicare Other | Admitting: Cardiology

## 2011-09-23 ENCOUNTER — Encounter: Payer: Self-pay | Admitting: Cardiology

## 2011-09-23 DIAGNOSIS — I509 Heart failure, unspecified: Secondary | ICD-10-CM

## 2011-09-23 DIAGNOSIS — I10 Essential (primary) hypertension: Secondary | ICD-10-CM

## 2011-09-23 DIAGNOSIS — M5430 Sciatica, unspecified side: Secondary | ICD-10-CM

## 2011-09-23 DIAGNOSIS — I5032 Chronic diastolic (congestive) heart failure: Secondary | ICD-10-CM

## 2011-09-23 NOTE — Patient Instructions (Signed)
Work on [employer] and weight loss Your physician recommends that you continue on your current medications as directed. Please refer to the Current Medication list given to you today. Your physician recommends that you schedule a follow-up appointment in: 4 months for office visit and EKG

## 2011-09-23 NOTE — Assessment & Plan Note (Signed)
No dizziness or syncope.  Blood pressure has been stable on current therapy

## 2011-09-23 NOTE — Assessment & Plan Note (Signed)
The patient does have some mild chronic exertional dyspnea but no other symptoms or signs of congestive heart failure.

## 2011-09-23 NOTE — Progress Notes (Signed)
[name] Date of Birth:  11-Dec-1930 [facility] [address]   [address] [place], [place]  [address] [phone]           Fax   [phone]  History of Present Illness: This pleasant 77 year old woman is seen for a four-month followup office visit.  She has a history of chronic diastolic congestive heart failure with normal systolic function.  She had cardiac catheterization in 2000 and again in 2004 which did not show any severe coronary disease per she developed anasarca in June of 2010 secondary to pericardial effusion with [place] not.  She underwent a pericardial window in June of 2010 with marked improvement.  Her last echocardiogram 09/25/09 showed normal LV systolic function and mild aortic stenosis and normal pulmonary artery pressure.  Current Outpatient Prescriptions  Medication Sig Dispense Refill  . amLODipine (NORVASC) 10 MG tablet Take 10 mg by mouth daily.        Marland Kitchen aspirin 81 MG tablet Take 81 mg by mouth daily.        Marland Kitchen atorvastatin (LIPITOR) 40 MG tablet Take 40 mg by mouth daily.        . Etodolac (LODINE PO) Take by mouth daily.        . furosemide (LASIX) 80 MG tablet Take 80 mg by mouth daily. Sometimes twice a day       . glipiZIDE (GLUCOTROL) 5 MG tablet Take 5 mg by mouth daily.       . insulin aspart (NOVOLOG) 100 UNIT/ML injection Inject 3 Units into the skin 3 (three) times daily before meals.        . insulin glargine (LANTUS) 100 UNIT/ML injection Inject 22 Units into the skin at bedtime.       . IRON PO Take 1 tablet by mouth daily.        . metoprolol succinate (TOPROL-XL) 25 MG 24 hr tablet Take 25 mg by mouth 2 (two) times daily.        . Pioglitazone HCl (ACTOS PO) Take 15 mg by mouth daily.       . valsartan (DIOVAN) 320 MG tablet Take 320 mg by mouth daily.        . ergocalciferol (VITAMIN D2) 50000 UNITS capsule Inject 50,000 Int'l Units as directed Once a week.      Marland Kitchen HYDROcodone-acetaminophen (NORCO) 10-325 MG per  tablet Take 10-325 mg by mouth Daily.        Allergies  Allergen Reactions  . Codeine   . Lodine (Etodolac) Other (See Comments)    dizziness  . Sulfa Drugs Cross Reactors     Patient Active Problem List  Diagnoses  . Coronary artery disease  . Hypertension  . Dyspnea  . Anasarca  . Chronic diastolic congestive heart failure  . Dyslipidemia  . Diabetes mellitus  . Sciatica neuralgia    History  Smoking status  . Never Smoker   Smokeless tobacco  . Not on file    History  Alcohol Use No    Family History  Problem Relation Age of Onset  . Heart attack Father   . Hypertension Father   . Stroke Mother   . Angina Mother   . Coronary artery disease      Review of Systems: Constitutional: no fever chills diaphoresis or fatigue or change in weight.  Head and neck: no hearing loss, no epistaxis, no photophobia or visual disturbance. Respiratory: No cough, shortness of breath or wheezing. Cardiovascular: No  chest pain peripheral edema, palpitations. Gastrointestinal: No abdominal distention, no abdominal pain, no change in bowel habits hematochezia or melena. Genitourinary: No dysuria, no frequency, no urgency, no nocturia. Musculoskeletal:No arthralgias, no back pain, no gait disturbance or myalgias. Neurological: No dizziness, no headaches, no numbness, no seizures, no syncope, no weakness, no tremors. Hematologic: No lymphadenopathy, no easy bruising. Psychiatric: No confusion, no hallucinations, no sleep disturbance.    Physical Exam: Filed Vitals:   09/23/11 1348  BP: 128/68  Pulse: 66  The head and neck exam reveals pupils equal and reactive.  Extraocular movements are full.  There is no scleral icterus.  The mouth and pharynx are normal.  The neck is supple.  The carotids reveal no bruits.  The jugular venous pressure is normal.  The  thyroid is not enlarged.  There is no lymphadenopathy.  The chest is clear to percussion and auscultation.  There are no  rales or rhonchi.  Expansion of the chest is symmetrical.  The precordium is quiet.  The first heart sound is normal.  The second heart sound is physiologically split.  There is no  gallop rub or click.  There is a soft systolic ejection murmur at the base.  No diastolic murmur.  There is no abnormal lift or heave.  The abdomen is soft and nontender.  The bowel sounds are normal.  The liver and spleen are not enlarged.  There are no abdominal masses.  There are no abdominal bruits.  Extremities reveal good pedal pulses.  There is no phlebitis or edema.  There is no cyanosis or clubbing.    The patient walks with a walker.  She has some residual weakness in the left leg.  The skin is warm and dry.  There is no rash.     Assessment / Plan: Continue same medication.  Recheck in 4 months for office visit and EKG.

## 2011-09-23 NOTE — Assessment & Plan Note (Signed)
The patient underwent back surgery on 05/03/11 by Dr. Venetia Maxon.  She has had a good partial recovery but still has pain and weakness in the left leg and walks with a walker.  For preoperative clearance prior to her back surgery she underwent a LexiScan Myoview stress test which showed an ejection fraction of 83% and no ischemia and this test was done on 04/23/11

## 2011-11-04 ENCOUNTER — Encounter: Payer: Self-pay | Admitting: Cardiology

## 2011-12-16 DIAGNOSIS — M4310 Spondylolisthesis, site unspecified: Secondary | ICD-10-CM | POA: Diagnosis not present

## 2012-01-13 DIAGNOSIS — E782 Mixed hyperlipidemia: Secondary | ICD-10-CM | POA: Diagnosis not present

## 2012-01-13 DIAGNOSIS — D631 Anemia in chronic kidney disease: Secondary | ICD-10-CM | POA: Diagnosis not present

## 2012-01-13 DIAGNOSIS — N182 Chronic kidney disease, stage 2 (mild): Secondary | ICD-10-CM | POA: Diagnosis not present

## 2012-01-13 DIAGNOSIS — E559 Vitamin D deficiency, unspecified: Secondary | ICD-10-CM | POA: Diagnosis not present

## 2012-01-13 DIAGNOSIS — E119 Type 2 diabetes mellitus without complications: Secondary | ICD-10-CM | POA: Diagnosis not present

## 2012-01-20 DIAGNOSIS — I10 Essential (primary) hypertension: Secondary | ICD-10-CM | POA: Diagnosis not present

## 2012-01-20 DIAGNOSIS — E782 Mixed hyperlipidemia: Secondary | ICD-10-CM | POA: Diagnosis not present

## 2012-01-20 DIAGNOSIS — E1149 Type 2 diabetes mellitus with other diabetic neurological complication: Secondary | ICD-10-CM | POA: Diagnosis not present

## 2012-01-20 DIAGNOSIS — E559 Vitamin D deficiency, unspecified: Secondary | ICD-10-CM | POA: Diagnosis not present

## 2012-01-20 DIAGNOSIS — D631 Anemia in chronic kidney disease: Secondary | ICD-10-CM | POA: Diagnosis not present

## 2012-01-20 DIAGNOSIS — N3090 Cystitis, unspecified without hematuria: Secondary | ICD-10-CM | POA: Diagnosis not present

## 2012-01-20 DIAGNOSIS — E1142 Type 2 diabetes mellitus with diabetic polyneuropathy: Secondary | ICD-10-CM | POA: Diagnosis not present

## 2012-01-20 DIAGNOSIS — N182 Chronic kidney disease, stage 2 (mild): Secondary | ICD-10-CM | POA: Diagnosis not present

## 2012-01-27 ENCOUNTER — Ambulatory Visit (INDEPENDENT_AMBULATORY_CARE_PROVIDER_SITE_OTHER): Payer: Medicare Other | Admitting: Cardiology

## 2012-01-27 ENCOUNTER — Encounter: Payer: Self-pay | Admitting: Cardiology

## 2012-01-27 VITALS — BP 138/70 | HR 60 | Ht 64.0 in | Wt 205.0 lb

## 2012-01-27 DIAGNOSIS — I509 Heart failure, unspecified: Secondary | ICD-10-CM | POA: Diagnosis not present

## 2012-01-27 DIAGNOSIS — I119 Hypertensive heart disease without heart failure: Secondary | ICD-10-CM

## 2012-01-27 DIAGNOSIS — M5432 Sciatica, left side: Secondary | ICD-10-CM

## 2012-01-27 DIAGNOSIS — I2510 Atherosclerotic heart disease of native coronary artery without angina pectoris: Secondary | ICD-10-CM

## 2012-01-27 DIAGNOSIS — M5430 Sciatica, unspecified side: Secondary | ICD-10-CM

## 2012-01-27 DIAGNOSIS — I5032 Chronic diastolic (congestive) heart failure: Secondary | ICD-10-CM

## 2012-01-27 DIAGNOSIS — E119 Type 2 diabetes mellitus without complications: Secondary | ICD-10-CM

## 2012-01-27 NOTE — Progress Notes (Signed)
[name] Date of Birth:  16-Aug-1931 [facility] [address] [place], [place]  [address] [phone]         Fax   [phone]  History of Present Illness: This pleasant 78 year old woman is seen for a scheduled followup office visit.  She has a past history of diastolic congestive heart failure.  In 2010 she developed worsening anasarca and was found to have pericarditis with pericardial effusion and tamponade.  She underwent surgery with excellent results and relief of her And on.  She also has a history of aortic stenosis.  At the time of her last echo her peak instantaneous gradient was 11 and her mean gradient was 6 system with mild aortic stenosis  Current Outpatient Prescriptions  Medication Sig Dispense Refill  . amLODipine (NORVASC) 10 MG tablet Take 10 mg by mouth daily.        Marland Kitchen aspirin 81 MG tablet Take 81 mg by mouth daily.        Marland Kitchen atorvastatin (LIPITOR) 40 MG tablet Take 40 mg by mouth daily.        . ergocalciferol (VITAMIN D2) 50000 UNITS capsule Inject 50,000 Int'l Units as directed Once a week.      . furosemide (LASIX) 80 MG tablet Take 80 mg by mouth daily. Sometimes twice a day       . glipiZIDE (GLUCOTROL) 5 MG tablet Take 5 mg by mouth daily.       Marland Kitchen HYDROcodone-acetaminophen (NORCO) 10-325 MG per tablet Take 10-325 mg by mouth Daily.      . insulin aspart (NOVOLOG) 100 UNIT/ML injection Inject 3 Units into the skin 3 (three) times daily before meals.        . insulin glargine (LANTUS) 100 UNIT/ML injection Inject 22 Units into the skin at bedtime.       . IRON PO Take 1 tablet by mouth daily.        . metoprolol succinate (TOPROL-XL) 25 MG 24 hr tablet Take 25 mg by mouth 2 (two) times daily.        . Pioglitazone HCl (ACTOS PO) Take 15 mg by mouth daily.       . valsartan (DIOVAN) 320 MG tablet Take 320 mg by mouth daily.          Allergies  Allergen Reactions  . Codeine   . Gabapentin     neuropathy  . Lodine (Etodolac) Other  (See Comments)    dizziness  . Sulfa Drugs Cross Reactors     Patient Active Problem List  Diagnoses  . Coronary artery disease  . Hypertension  . Dyspnea  . Anasarca  . Chronic diastolic congestive heart failure  . Dyslipidemia  . Diabetes mellitus  . Sciatica neuralgia    History  Smoking status  . Never Smoker   Smokeless tobacco  . Not on file    History  Alcohol Use No    Family History  Problem Relation Age of Onset  . Heart attack Father   . Hypertension Father   . Stroke Mother   . Angina Mother   . Coronary artery disease      Review of Systems: Constitutional: no fever chills diaphoresis or fatigue or change in weight.  Head and neck: no hearing loss, no epistaxis, no photophobia or visual disturbance. Respiratory: No cough, shortness of breath or wheezing. Cardiovascular: No chest pain peripheral edema, palpitations. Gastrointestinal: No abdominal distention, no abdominal pain, no change in bowel habits hematochezia or  melena. Genitourinary: No dysuria, no frequency, no urgency, no nocturia. Musculoskeletal:No arthralgias, no back pain, no gait disturbance or myalgias. Neurological: No dizziness, no headaches, no numbness, no seizures, no syncope, no weakness, no tremors. Hematologic: No lymphadenopathy, no easy bruising. Psychiatric: No confusion, no hallucinations, no sleep disturbance.    Physical Exam: Filed Vitals:   01/27/12 1408  BP: 138/70  Pulse: 60   the general appearance reveals a well-developed well-nourished woman who is doing in a wheelchair.  She is in no distress.Pupils equal and reactive.   Extraocular Movements are full.  There is no scleral icterus.  The mouth and pharynx are normal.  The neck is supple.  The carotids reveal no bruits.  The jugular venous pressure is normal.  The thyroid is not enlarged.  There is no lymphadenopathy.  The chest is clear to percussion and auscultation. There are no rales or rhonchi. Expansion of  the chest is symmetrical.  Heart reveals a soft basilar systolic murmur.The abdomen is soft and nontender. Bowel sounds are normal. The liver and spleen are not enlarged. There Are no abdominal masses. There are no bruits.  The pedal pulses are good.  There is no phlebitis or edema.  There is no cyanosis or clubbing. Strength is normal and symmetrical in all extremities.  There is no lateralizing weakness.  There are no sensory deficits.  She does complain of chronic sciatic type pain in the left leg.  EKG shows sinus bradycardia and left anterior hemiblock and no ischemic changes.     Assessment / Plan: Continue on same medication.  We will update her 2-D echo.  Return in 4 months for followup office visit.  She had a LEXA scan on 04/23/11 which showed an ejection fraction of 83% and no reversible ischemia

## 2012-01-27 NOTE — Assessment & Plan Note (Signed)
The patient has had 3 back operations.  Her first was in 2001, the second was in 2007, and the third was in 2012.  Unfortunately, the last operation did not help her leg pain significantly according to the patient.

## 2012-01-27 NOTE — Assessment & Plan Note (Signed)
Patient has a history of diabetes mellitus.  She has what sounds like diabetic peripheral neuropathy.  She has had a trial of Neurontin which did not work and she had side effects from it

## 2012-01-27 NOTE — Patient Instructions (Signed)
Your physician recommends that you continue on your current medications as directed. Please refer to the Current Medication list given to you today.  Your physician has requested that you have an echocardiogram. Echocardiography is a painless test that uses sound waves to create images of your heart. It provides your doctor with information about the size and shape of your heart and how well your heart's chambers and valves are working. This procedure takes approximately one hour. There are no restrictions for this procedure.  Your physician wants you to follow-up in: 4 months You will receive a reminder letter in the mail two months in advance. If you don't receive a letter, please call our office to schedule the follow-up appointment.

## 2012-01-27 NOTE — Assessment & Plan Note (Signed)
The patient is not having any significant dyspnea at rest.  She is much less physically active because of problems with sciatica and left lower leg pain.  Today she arrives by wheelchair.  She has to use a cane at home.

## 2012-02-10 ENCOUNTER — Other Ambulatory Visit: Payer: Self-pay

## 2012-02-10 ENCOUNTER — Ambulatory Visit (HOSPITAL_COMMUNITY): Payer: Medicare Other | Attending: Cardiology

## 2012-02-10 DIAGNOSIS — E119 Type 2 diabetes mellitus without complications: Secondary | ICD-10-CM | POA: Diagnosis not present

## 2012-02-10 DIAGNOSIS — E785 Hyperlipidemia, unspecified: Secondary | ICD-10-CM | POA: Diagnosis not present

## 2012-02-10 DIAGNOSIS — I2510 Atherosclerotic heart disease of native coronary artery without angina pectoris: Secondary | ICD-10-CM | POA: Diagnosis not present

## 2012-02-10 DIAGNOSIS — I10 Essential (primary) hypertension: Secondary | ICD-10-CM | POA: Insufficient documentation

## 2012-02-10 DIAGNOSIS — I517 Cardiomegaly: Secondary | ICD-10-CM | POA: Diagnosis not present

## 2012-02-10 DIAGNOSIS — I5032 Chronic diastolic (congestive) heart failure: Secondary | ICD-10-CM

## 2012-02-10 DIAGNOSIS — I359 Nonrheumatic aortic valve disorder, unspecified: Secondary | ICD-10-CM

## 2012-02-10 DIAGNOSIS — I509 Heart failure, unspecified: Secondary | ICD-10-CM | POA: Insufficient documentation

## 2012-02-21 ENCOUNTER — Telehealth: Payer: Self-pay | Admitting: *Deleted

## 2012-02-21 NOTE — Telephone Encounter (Signed)
Message copied by Burnell Blanks on Fri Feb 21, 2012  8:56 AM ------      Message from: Cassell Clement      Created: Sun Feb 16, 2012  8:45 PM       Please report.  The echo is satisfactory.  LV systolic function is still normal.No pericardial effusion.

## 2012-02-21 NOTE — Telephone Encounter (Signed)
Advised of echo results 

## 2012-02-22 ENCOUNTER — Other Ambulatory Visit: Payer: Self-pay | Admitting: Cardiology

## 2012-02-24 DIAGNOSIS — M25559 Pain in unspecified hip: Secondary | ICD-10-CM | POA: Diagnosis not present

## 2012-02-24 DIAGNOSIS — M4310 Spondylolisthesis, site unspecified: Secondary | ICD-10-CM | POA: Diagnosis not present

## 2012-02-24 NOTE — Telephone Encounter (Signed)
Refilled furosemide

## 2012-02-28 DIAGNOSIS — M169 Osteoarthritis of hip, unspecified: Secondary | ICD-10-CM | POA: Diagnosis not present

## 2012-03-03 ENCOUNTER — Other Ambulatory Visit: Payer: Self-pay | Admitting: Orthopedic Surgery

## 2012-03-03 ENCOUNTER — Encounter (HOSPITAL_COMMUNITY): Payer: Self-pay | Admitting: Pharmacy Technician

## 2012-03-03 NOTE — H&P (Signed)
Subjective: [name] is a 78 year old patient  sent over by Dr. Wyline Mood for evaluation of left hip arthritis.  She reports that her symptoms have been progressively worsening over the last 6 months and she now ambulates with a walker.  She localizes her pain to the groin.  She has had back surgery by Dr. Venetia Maxon.  She reports that her hip pain will often wakes her from sleep at night.  Past medical history is significant for hypertension, high cholesterol, diabetes.  Past surgical history significant for 3 lumbar spine surgeries.  Current medications include hydrocodone, metoprolol, Lipitor, Actos, Diovan, aspirin, Lasix, amlodipine, glipizide, Lantus, NovoLog.  She is allergic to sulfa, codeine, Percocet.  Family history: Is significant for hypertension and heart disease  Social history: Denies use of alcohol or tobacco.  Retired.  Objective: Well-developed, well-nourished 78 year old female who is alert and oriented in no acute distress.  She is 5 feet 4 inches tall and 200 pounds.  Examination of the left hip demonstrates significant pain with attempts at internal rotation.  Foot tap is negative.  Neurovascular exam is within normal limits.  X-rays: 2 views of the left hip, taken elsewhere, were reviewed in our office today demonstrate bone-on-bone degenerative changes in the superior weightbearing dome of the hip with lateral subluxation of the femoral head.  Asses: End-stage arthritis of the left hip  Plan:.  We have discussed the options in detail with [name] today.  At this point she would like to proceed with left total hip arthroplasty.  All risks and benefits of surgery were discussed with the patient and her husband today.

## 2012-03-04 ENCOUNTER — Encounter (HOSPITAL_COMMUNITY): Payer: Self-pay

## 2012-03-04 ENCOUNTER — Encounter (HOSPITAL_COMMUNITY)
Admission: RE | Admit: 2012-03-04 | Discharge: 2012-03-04 | Disposition: A | Discharge status: Home / Self Care | Payer: Medicare Other | Source: Ambulatory Visit | Attending: Orthopedic Surgery | Admitting: Orthopedic Surgery

## 2012-03-04 HISTORY — DX: Nausea with vomiting, unspecified: R11.2

## 2012-03-04 HISTORY — DX: Other specified postprocedural states: Z98.890

## 2012-03-04 LAB — PROTIME-INR
INR: 1.01 (ref 0.00–1.49)
Prothrombin Time: 13.5 seconds (ref 11.6–15.2)

## 2012-03-04 LAB — URINALYSIS, ROUTINE W REFLEX MICROSCOPIC
Bilirubin Urine: NEGATIVE
Glucose, UA: NEGATIVE mg/dL
Hgb urine dipstick: NEGATIVE
Ketones, ur: NEGATIVE mg/dL
Nitrite: NEGATIVE
Protein, ur: NEGATIVE mg/dL
Specific Gravity, Urine: 1.013 (ref 1.005–1.030)
Urobilinogen, UA: 0.2 mg/dL (ref 0.0–1.0)
pH: 5 (ref 5.0–8.0)

## 2012-03-04 LAB — CBC
HCT: 36.8 % (ref 36.0–46.0)
Hemoglobin: 11.7 g/dL — ABNORMAL LOW (ref 12.0–15.0)
MCH: 28.1 pg (ref 26.0–34.0)
MCHC: 31.8 g/dL (ref 30.0–36.0)
MCV: 88.5 fL (ref 78.0–100.0)
Platelets: 235 10*3/uL (ref 150–400)
RBC: 4.16 MIL/uL (ref 3.87–5.11)
RDW: 13.9 % (ref 11.5–15.5)
WBC: 7.6 10*3/uL (ref 4.0–10.5)

## 2012-03-04 LAB — BASIC METABOLIC PANEL
BUN: 32 mg/dL — ABNORMAL HIGH (ref 6–23)
CO2: 25 mEq/L (ref 19–32)
Calcium: 9.3 mg/dL (ref 8.4–10.5)
Chloride: 104 mEq/L (ref 96–112)
Creatinine, Ser: 1.35 mg/dL — ABNORMAL HIGH (ref 0.50–1.10)
GFR calc Af Amer: 42 mL/min — ABNORMAL LOW (ref >90)
GFR calc non Af Amer: 37 mL/min — ABNORMAL LOW (ref >90)
Glucose, Bld: 154 mg/dL — ABNORMAL HIGH (ref 70–99)
Potassium: 4.1 mEq/L (ref 3.5–5.1)
Sodium: 139 mEq/L (ref 135–145)

## 2012-03-04 LAB — URINE MICROSCOPIC-ADD ON

## 2012-03-04 LAB — SURGICAL PCR SCREEN
MRSA, PCR: NEGATIVE
Staphylococcus aureus: NEGATIVE

## 2012-03-04 LAB — DIFFERENTIAL
Basophils Absolute: 0 10*3/uL (ref 0.0–0.1)
Basophils Relative: 1 % (ref 0–1)
Eosinophils Absolute: 0.3 10*3/uL (ref 0.0–0.7)
Eosinophils Relative: 4 % (ref 0–5)
Lymphocytes Relative: 25 % (ref 12–46)
Lymphs Abs: 1.9 10*3/uL (ref 0.7–4.0)
Monocytes Absolute: 0.5 10*3/uL (ref 0.1–1.0)
Monocytes Relative: 6 % (ref 3–12)
Neutro Abs: 4.9 10*3/uL (ref 1.7–7.7)
Neutrophils Relative %: 64 % (ref 43–77)

## 2012-03-04 LAB — APTT: aPTT: 32 seconds (ref 24–37)

## 2012-03-04 NOTE — Progress Notes (Addendum)
Pt has incentive spirometer from previous surgery in 04/2011, will bring DOS.   Chart left for review by anesthesiology.   Left message for Olegario Messier stating pt needs direction as to whether to stop her 81mg  ASA prior to surgery Friday. Pt states she did not receive instruction from Dr. Wadie Lessen office and there was no order in [facility].

## 2012-03-04 NOTE — Pre-Procedure Instructions (Signed)
22 [name]  03/04/2012   Your procedure is scheduled on:  Friday May 3  Report to [facility] at 10:00 AM.  Call this number if you have problems the morning of surgery: [phone]   Remember:   Do not eat food:After Midnight.  May have clear liquids: up to 4 Hours before arrival.  Clear liquids include soda, tea, black coffee, apple or grape juice, broth.  Take these medicines the morning of surgery with A SIP OF WATER: Amlodipine, Metoprolol, Norco if needed   Do not wear jewelry, make-up or nail polish.  Do not wear lotions, powders, or perfumes. You may wear deodorant.  Do not shave 48 hours prior to surgery.  Do not bring valuables to the hospital.  Contacts, dentures or bridgework may not be worn into surgery.  Leave suitcase in the car. After surgery it may be brought to your room.  For patients admitted to the hospital, checkout time is 11:00 AM the day of discharge.   Patients discharged the day of surgery will not be allowed to drive home.  Name and phone number of your driver: NA  Special Instructions: Incentive Spirometry - Practice and bring it with you on the day of surgery. and CHG Shower Use Special Wash: 1/2 bottle night before surgery and 1/2 bottle morning of surgery.   Please read over the following fact sheets that you were given: Pain Booklet, Coughing and Deep Breathing, Blood Transfusion Information, Total Joint Packet and Surgical Site Infection Prevention

## 2012-03-05 MED ORDER — CHLORHEXIDINE GLUCONATE 4 % EX LIQD: 60.0000 mL | Freq: Once | CUTANEOUS | Status: DC

## 2012-03-05 MED ORDER — CEFAZOLIN SODIUM-DEXTROSE 2-3 GM-% IV SOLR
2.0000 g | INTRAVENOUS | Status: AC
  Administered 2012-03-06: 2 g via INTRAVENOUS
  Filled 2012-03-05: qty 50

## 2012-03-05 NOTE — Consult Note (Signed)
Anesthesia Chart Review: Patient is a 78 year old female scheduled for left THA on 03/06/12.  History includes diastolic CHF with chronic dyspnea, anasarca and pericarditis with pericardial tamponade s/p pericardial window in '10, CAD, CKD, obesity, non-smoker, DM2, dyslipidemia, HTN, multiple back surgeries, last in June 2012.    Her Cardiologist is Dr. Patty Sermons. She just saw him on 01/27/12.  EKG then showed SB with LAFB.  By notes, it appears she was felt overall stable.  He did recommend a repeat echo which was done on 02/10/12.  It showed: - Left ventricle: The cavity size was normal. Wall thickness was increased in a pattern of mild LVH. The estimated ejection fraction was 60%. Wall motion was normal; there were no regional wall motion abnormalities. Doppler parameters are consistent with abnormal left ventricular relaxation (grade 1 diastolic dysfunction). - Mitral valve: Calcified annulus. - Left atrium: The atrium was mildly dilated. - Right ventricle: The cavity size was normal. Systolic function was mildly reduced. - Right atrium: The atrium was mildly dilated. - Aortic valve sclerosis without stenosis. - Mild Tricuspid Regurgitation.  She had a normal stress nuclear study, EF 83% on 04/23/11.  The last cath I see is from 04/25/03 which showed mid LAD 30-40%, D1 70-80% and D2 subtotally occluded (both were not felt to be candidates for angioplasty), mid RCA 20-25%, PDA/PL and LCx/OM1 "unremarkable".    CXR on 04/29/11 showed no active disease.  Labs noted.  Cr 1.35.  H/H 11.07/36.8. T&S done. Non-fasting glucose is 154.  UA shows moderate leukocytes, negative nitrites.  I called her UA result to Agustin Cree at Dr. Wadie Lessen office.    She has had a recent Cardiology evaluation and was without chest pain or significant edema, her follow-up echo was "satisfactory," and she had a normal stress test less than a year ago.  If she remains free of angina and does not exhibit any worrisome CHF  symptoms, then anticipate she can proceed from an Anesthesia standpoint.  Shonna Chock, PA-C

## 2012-03-06 ENCOUNTER — Encounter (HOSPITAL_COMMUNITY): Payer: Self-pay | Admitting: Vascular Surgery

## 2012-03-06 ENCOUNTER — Ambulatory Visit (HOSPITAL_COMMUNITY): Payer: Medicare Other | Admitting: Vascular Surgery

## 2012-03-06 ENCOUNTER — Encounter (HOSPITAL_COMMUNITY): Payer: Self-pay | Admitting: *Deleted

## 2012-03-06 ENCOUNTER — Inpatient Hospital Stay (HOSPITAL_COMMUNITY): Payer: Medicare Other

## 2012-03-06 ENCOUNTER — Encounter (HOSPITAL_COMMUNITY)
Admission: RE | Disposition: A | Discharge status: Skilled Nursing Facility | Payer: Self-pay | Source: Ambulatory Visit | Attending: Orthopedic Surgery

## 2012-03-06 ENCOUNTER — Ambulatory Visit (HOSPITAL_COMMUNITY): Payer: Medicare Other

## 2012-03-06 ENCOUNTER — Inpatient Hospital Stay (HOSPITAL_COMMUNITY)
Admission: RE | Admit: 2012-03-06 | Discharge: 2012-03-10 | DRG: 470 | Disposition: A | Discharge status: Skilled Nursing Facility | Payer: Medicare Other | Source: Ambulatory Visit | Attending: Orthopedic Surgery | Admitting: Orthopedic Surgery

## 2012-03-06 DIAGNOSIS — Z79899 Other long term (current) drug therapy: Secondary | ICD-10-CM

## 2012-03-06 DIAGNOSIS — R0989 Other specified symptoms and signs involving the circulatory and respiratory systems: Secondary | ICD-10-CM | POA: Diagnosis not present

## 2012-03-06 DIAGNOSIS — Z01812 Encounter for preprocedural laboratory examination: Secondary | ICD-10-CM

## 2012-03-06 DIAGNOSIS — N189 Chronic kidney disease, unspecified: Secondary | ICD-10-CM | POA: Diagnosis present

## 2012-03-06 DIAGNOSIS — N390 Urinary tract infection, site not specified: Secondary | ICD-10-CM | POA: Diagnosis not present

## 2012-03-06 DIAGNOSIS — I129 Hypertensive chronic kidney disease with stage 1 through stage 4 chronic kidney disease, or unspecified chronic kidney disease: Secondary | ICD-10-CM | POA: Diagnosis present

## 2012-03-06 DIAGNOSIS — M1610 Unilateral primary osteoarthritis, unspecified hip: Principal | ICD-10-CM | POA: Diagnosis present

## 2012-03-06 DIAGNOSIS — Z5189 Encounter for other specified aftercare: Secondary | ICD-10-CM | POA: Diagnosis not present

## 2012-03-06 DIAGNOSIS — Q762 Congenital spondylolisthesis: Secondary | ICD-10-CM | POA: Diagnosis not present

## 2012-03-06 DIAGNOSIS — IMO0002 Reserved for concepts with insufficient information to code with codable children: Secondary | ICD-10-CM | POA: Diagnosis not present

## 2012-03-06 DIAGNOSIS — I10 Essential (primary) hypertension: Secondary | ICD-10-CM | POA: Diagnosis not present

## 2012-03-06 DIAGNOSIS — Z794 Long term (current) use of insulin: Secondary | ICD-10-CM

## 2012-03-06 DIAGNOSIS — E119 Type 2 diabetes mellitus without complications: Secondary | ICD-10-CM | POA: Diagnosis present

## 2012-03-06 DIAGNOSIS — Z7982 Long term (current) use of aspirin: Secondary | ICD-10-CM | POA: Diagnosis not present

## 2012-03-06 DIAGNOSIS — I5032 Chronic diastolic (congestive) heart failure: Secondary | ICD-10-CM | POA: Diagnosis present

## 2012-03-06 DIAGNOSIS — I2510 Atherosclerotic heart disease of native coronary artery without angina pectoris: Secondary | ICD-10-CM | POA: Diagnosis present

## 2012-03-06 DIAGNOSIS — E7800 Pure hypercholesterolemia, unspecified: Secondary | ICD-10-CM | POA: Diagnosis present

## 2012-03-06 DIAGNOSIS — Z471 Aftercare following joint replacement surgery: Secondary | ICD-10-CM | POA: Diagnosis not present

## 2012-03-06 DIAGNOSIS — M4120 Other idiopathic scoliosis, site unspecified: Secondary | ICD-10-CM | POA: Diagnosis not present

## 2012-03-06 DIAGNOSIS — M479 Spondylosis, unspecified: Secondary | ICD-10-CM | POA: Diagnosis not present

## 2012-03-06 DIAGNOSIS — M169 Osteoarthritis of hip, unspecified: Secondary | ICD-10-CM | POA: Diagnosis not present

## 2012-03-06 DIAGNOSIS — R609 Edema, unspecified: Secondary | ICD-10-CM | POA: Diagnosis not present

## 2012-03-06 DIAGNOSIS — M25559 Pain in unspecified hip: Secondary | ICD-10-CM | POA: Diagnosis not present

## 2012-03-06 DIAGNOSIS — Z96649 Presence of unspecified artificial hip joint: Secondary | ICD-10-CM | POA: Diagnosis not present

## 2012-03-06 DIAGNOSIS — M129 Arthropathy, unspecified: Secondary | ICD-10-CM | POA: Diagnosis not present

## 2012-03-06 DIAGNOSIS — D62 Acute posthemorrhagic anemia: Secondary | ICD-10-CM | POA: Diagnosis not present

## 2012-03-06 HISTORY — PX: TOTAL HIP ARTHROPLASTY: SHX124

## 2012-03-06 LAB — GLUCOSE, CAPILLARY
Glucose-Capillary: 137 mg/dL — ABNORMAL HIGH (ref 70–99)
Glucose-Capillary: 265 mg/dL — ABNORMAL HIGH (ref 70–99)

## 2012-03-06 SURGERY — ARTHROPLASTY, HIP, TOTAL,POSTERIOR APPROACH
Anesthesia: General | Site: Hip | Laterality: Left | Wound class: Clean

## 2012-03-06 MED ORDER — ACETAMINOPHEN 650 MG RE SUPP: 650.0000 mg | Freq: Four times a day (QID) | RECTAL | Status: DC | PRN

## 2012-03-06 MED ORDER — LACTATED RINGERS IV SOLN: INTRAVENOUS | Status: DC

## 2012-03-06 MED ORDER — LACTATED RINGERS IV SOLN
INTRAVENOUS | Status: DC | PRN
  Administered 2012-03-06 (×2): via INTRAVENOUS

## 2012-03-06 MED ORDER — PHENOL 1.4 % MT LIQD: 1.0000 | OROMUCOSAL | Status: DC | PRN

## 2012-03-06 MED ORDER — MAGNESIUM HYDROXIDE 400 MG/5ML PO SUSP
30.0000 mL | Freq: Every day | ORAL | Status: DC | PRN
  Administered 2012-03-08: 30 mL via ORAL
  Filled 2012-03-06: qty 30

## 2012-03-06 MED ORDER — GLIPIZIDE 5 MG PO TABS
5.0000 mg | ORAL_TABLET | Freq: Every day | ORAL | Status: DC
  Administered 2012-03-07 – 2012-03-09 (×3): 5 mg via ORAL
  Filled 2012-03-06 (×4): qty 1

## 2012-03-06 MED ORDER — HYDROMORPHONE HCL PF 1 MG/ML IJ SOLN
INTRAMUSCULAR | Status: AC
  Filled 2012-03-06: qty 1

## 2012-03-06 MED ORDER — METOPROLOL TARTRATE 25 MG PO TABS
25.0000 mg | ORAL_TABLET | Freq: Two times a day (BID) | ORAL | Status: DC
  Administered 2012-03-06 – 2012-03-10 (×8): 25 mg via ORAL
  Filled 2012-03-06 (×9): qty 1

## 2012-03-06 MED ORDER — METOCLOPRAMIDE HCL 10 MG PO TABS: 5.0000 mg | ORAL_TABLET | Freq: Three times a day (TID) | ORAL | Status: DC | PRN

## 2012-03-06 MED ORDER — INSULIN ASPART 100 UNIT/ML ~~LOC~~ SOLN
3.0000 [IU] | Freq: Three times a day (TID) | SUBCUTANEOUS | Status: DC
  Administered 2012-03-07: 8 [IU] via SUBCUTANEOUS

## 2012-03-06 MED ORDER — ONDANSETRON HCL 4 MG/2ML IJ SOLN
4.0000 mg | Freq: Once | INTRAMUSCULAR | Status: AC | PRN
  Administered 2012-03-06: 4 mg via INTRAVENOUS

## 2012-03-06 MED ORDER — MORPHINE SULFATE 4 MG/ML IJ SOLN: 0.0500 mg/kg | INTRAMUSCULAR | Status: DC | PRN

## 2012-03-06 MED ORDER — DEXTROSE-NACL 5-0.45 % IV SOLN: INTRAVENOUS | Status: DC

## 2012-03-06 MED ORDER — FUROSEMIDE 80 MG PO TABS
80.0000 mg | ORAL_TABLET | Freq: Every day | ORAL | Status: DC
  Administered 2012-03-07 – 2012-03-10 (×4): 80 mg via ORAL
  Filled 2012-03-06 (×4): qty 1

## 2012-03-06 MED ORDER — ATORVASTATIN CALCIUM 40 MG PO TABS
40.0000 mg | ORAL_TABLET | Freq: Every evening | ORAL | Status: DC
  Administered 2012-03-06 – 2012-03-09 (×4): 40 mg via ORAL
  Filled 2012-03-06 (×5): qty 1

## 2012-03-06 MED ORDER — METOCLOPRAMIDE HCL 5 MG/ML IJ SOLN
5.0000 mg | Freq: Three times a day (TID) | INTRAMUSCULAR | Status: DC | PRN
  Administered 2012-03-09 (×2): 10 mg via INTRAVENOUS
  Filled 2012-03-06 (×2): qty 2

## 2012-03-06 MED ORDER — BISACODYL 10 MG RE SUPP
10.0000 mg | Freq: Every day | RECTAL | Status: DC | PRN
  Administered 2012-03-09: 10 mg via RECTAL
  Filled 2012-03-06: qty 1

## 2012-03-06 MED ORDER — ALBUMIN HUMAN 5 % IV SOLN
INTRAVENOUS | Status: DC | PRN
  Administered 2012-03-06 (×2): via INTRAVENOUS

## 2012-03-06 MED ORDER — DEXTROSE 5 % IV SOLN
500.0000 mg | INTRAVENOUS | Status: AC
  Administered 2012-03-06: 500 mg via INTRAVENOUS
  Filled 2012-03-06: qty 5

## 2012-03-06 MED ORDER — KCL IN DEXTROSE-NACL 20-5-0.45 MEQ/L-%-% IV SOLN
INTRAVENOUS | Status: DC
  Administered 2012-03-07: 1000 mL via INTRAVENOUS
  Administered 2012-03-07: 02:00:00 via INTRAVENOUS
  Filled 2012-03-06 (×13): qty 1000

## 2012-03-06 MED ORDER — ONDANSETRON HCL 4 MG/2ML IJ SOLN
INTRAMUSCULAR | Status: AC
  Filled 2012-03-06: qty 2

## 2012-03-06 MED ORDER — SULFAMETHOXAZOLE-TRIMETHOPRIM 400-80 MG PO TABS
1.0000 | ORAL_TABLET | Freq: Two times a day (BID) | ORAL | Status: DC
  Administered 2012-03-06 – 2012-03-07 (×3): 1 via ORAL
  Filled 2012-03-06 (×5): qty 1

## 2012-03-06 MED ORDER — ONDANSETRON HCL 4 MG PO TABS
4.0000 mg | ORAL_TABLET | Freq: Four times a day (QID) | ORAL | Status: DC | PRN
  Administered 2012-03-08: 4 mg via ORAL
  Filled 2012-03-06: qty 1

## 2012-03-06 MED ORDER — METHOCARBAMOL 500 MG PO TABS
500.0000 mg | ORAL_TABLET | Freq: Four times a day (QID) | ORAL | Status: DC | PRN
  Administered 2012-03-07 – 2012-03-09 (×5): 500 mg via ORAL
  Filled 2012-03-06 (×5): qty 1

## 2012-03-06 MED ORDER — CEFUROXIME SODIUM 1.5 G IJ SOLR
INTRAMUSCULAR | Status: DC | PRN
  Administered 2012-03-06: 1.5 g

## 2012-03-06 MED ORDER — HYDROCODONE-ACETAMINOPHEN 5-325 MG PO TABS
1.0000 | ORAL_TABLET | ORAL | Status: DC | PRN
  Administered 2012-03-07: 1 via ORAL
  Administered 2012-03-07: 2 via ORAL
  Administered 2012-03-08 (×4): 1 via ORAL
  Administered 2012-03-09 (×2): 2 via ORAL
  Filled 2012-03-06: qty 1
  Filled 2012-03-06: qty 2
  Filled 2012-03-06 (×2): qty 1
  Filled 2012-03-06 (×2): qty 2
  Filled 2012-03-06 (×2): qty 1

## 2012-03-06 MED ORDER — LIDOCAINE HCL (CARDIAC) 20 MG/ML IV SOLN
INTRAVENOUS | Status: DC | PRN
  Administered 2012-03-06: 80 mg via INTRAVENOUS

## 2012-03-06 MED ORDER — ONDANSETRON HCL 4 MG/2ML IJ SOLN
INTRAMUSCULAR | Status: DC | PRN
  Administered 2012-03-06: 4 mg via INTRAVENOUS

## 2012-03-06 MED ORDER — METHOCARBAMOL 100 MG/ML IJ SOLN
500.0000 mg | Freq: Four times a day (QID) | INTRAVENOUS | Status: DC | PRN
  Filled 2012-03-06: qty 5

## 2012-03-06 MED ORDER — AMLODIPINE BESYLATE 5 MG PO TABS
5.0000 mg | ORAL_TABLET | Freq: Every day | ORAL | Status: DC
  Administered 2012-03-06 – 2012-03-10 (×5): 5 mg via ORAL
  Filled 2012-03-06 (×5): qty 1

## 2012-03-06 MED ORDER — FENTANYL CITRATE 0.05 MG/ML IJ SOLN
INTRAMUSCULAR | Status: DC | PRN
  Administered 2012-03-06: 50 ug via INTRAVENOUS
  Administered 2012-03-06: 100 ug via INTRAVENOUS
  Administered 2012-03-06 (×3): 50 ug via INTRAVENOUS

## 2012-03-06 MED ORDER — MENTHOL 3 MG MT LOZG: 1.0000 | LOZENGE | OROMUCOSAL | Status: DC | PRN

## 2012-03-06 MED ORDER — ASPIRIN 325 MG PO TABS
325.0000 mg | ORAL_TABLET | Freq: Two times a day (BID) | ORAL | Status: DC
  Administered 2012-03-06 – 2012-03-10 (×8): 325 mg via ORAL
  Filled 2012-03-06 (×10): qty 1

## 2012-03-06 MED ORDER — PIOGLITAZONE HCL 15 MG PO TABS
15.0000 mg | ORAL_TABLET | Freq: Every day | ORAL | Status: DC
  Administered 2012-03-07 – 2012-03-10 (×4): 15 mg via ORAL
  Filled 2012-03-06 (×5): qty 1

## 2012-03-06 MED ORDER — PROPOFOL 10 MG/ML IV EMUL
INTRAVENOUS | Status: DC | PRN
  Administered 2012-03-06: 120 mg via INTRAVENOUS

## 2012-03-06 MED ORDER — GLYCOPYRROLATE 0.2 MG/ML IJ SOLN
INTRAMUSCULAR | Status: DC | PRN
  Administered 2012-03-06: .6 mg via INTRAVENOUS

## 2012-03-06 MED ORDER — HYDROMORPHONE HCL PF 1 MG/ML IJ SOLN
0.5000 mg | INTRAMUSCULAR | Status: DC | PRN
  Administered 2012-03-06 – 2012-03-07 (×3): 1 mg via INTRAVENOUS
  Filled 2012-03-06 (×3): qty 1

## 2012-03-06 MED ORDER — ALUM & MAG HYDROXIDE-SIMETH 200-200-20 MG/5ML PO SUSP: 30.0000 mL | ORAL | Status: DC | PRN

## 2012-03-06 MED ORDER — BUPIVACAINE-EPINEPHRINE 0.5% -1:200000 IJ SOLN
INTRAMUSCULAR | Status: DC | PRN
  Administered 2012-03-06: 20 mL

## 2012-03-06 MED ORDER — DIPHENHYDRAMINE HCL 12.5 MG/5ML PO ELIX: 12.5000 mg | ORAL_SOLUTION | ORAL | Status: DC | PRN

## 2012-03-06 MED ORDER — HYDROMORPHONE HCL PF 1 MG/ML IJ SOLN
0.2500 mg | INTRAMUSCULAR | Status: DC | PRN
  Administered 2012-03-06 (×2): 0.5 mg via INTRAVENOUS

## 2012-03-06 MED ORDER — ONDANSETRON HCL 4 MG/2ML IJ SOLN
4.0000 mg | Freq: Four times a day (QID) | INTRAMUSCULAR | Status: DC | PRN
  Administered 2012-03-07 – 2012-03-09 (×3): 4 mg via INTRAVENOUS
  Filled 2012-03-06 (×4): qty 2

## 2012-03-06 MED ORDER — INSULIN ASPART 100 UNIT/ML ~~LOC~~ SOLN
0.0000 [IU] | Freq: Three times a day (TID) | SUBCUTANEOUS | Status: DC
  Administered 2012-03-07 – 2012-03-08 (×5): 3 [IU] via SUBCUTANEOUS

## 2012-03-06 MED ORDER — SODIUM CHLORIDE 0.9 % IR SOLN
Status: DC | PRN
  Administered 2012-03-06: 3000 mL

## 2012-03-06 MED ORDER — ZOLPIDEM TARTRATE 5 MG PO TABS: 5.0000 mg | ORAL_TABLET | Freq: Every evening | ORAL | Status: DC | PRN

## 2012-03-06 MED ORDER — FLEET ENEMA 7-19 GM/118ML RE ENEM: 1.0000 | ENEMA | Freq: Once | RECTAL | Status: AC | PRN

## 2012-03-06 MED ORDER — ACETAMINOPHEN 10 MG/ML IV SOLN
INTRAVENOUS | Status: DC | PRN
  Administered 2012-03-06: 1000 mg via INTRAVENOUS

## 2012-03-06 MED ORDER — ACETAMINOPHEN 10 MG/ML IV SOLN
INTRAVENOUS | Status: AC
  Filled 2012-03-06: qty 100

## 2012-03-06 MED ORDER — ACETAMINOPHEN 325 MG PO TABS
650.0000 mg | ORAL_TABLET | Freq: Four times a day (QID) | ORAL | Status: DC | PRN
  Administered 2012-03-10: 650 mg via ORAL
  Filled 2012-03-06 (×2): qty 2

## 2012-03-06 MED ORDER — MIDAZOLAM HCL 5 MG/5ML IJ SOLN
INTRAMUSCULAR | Status: DC | PRN
  Administered 2012-03-06: 1 mg via INTRAVENOUS

## 2012-03-06 MED ORDER — INSULIN GLARGINE 100 UNIT/ML ~~LOC~~ SOLN
22.0000 [IU] | Freq: Every day | SUBCUTANEOUS | Status: DC
  Administered 2012-03-06: 22 [IU] via SUBCUTANEOUS
  Administered 2012-03-07: 12 [IU] via SUBCUTANEOUS
  Administered 2012-03-08: 22 [IU] via SUBCUTANEOUS

## 2012-03-06 MED ORDER — NEOSTIGMINE METHYLSULFATE 1 MG/ML IJ SOLN
INTRAMUSCULAR | Status: DC | PRN
  Administered 2012-03-06: 4 mg via INTRAVENOUS

## 2012-03-06 MED ORDER — ROCURONIUM BROMIDE 100 MG/10ML IV SOLN
INTRAVENOUS | Status: DC | PRN
  Administered 2012-03-06: 50 mg via INTRAVENOUS

## 2012-03-06 MED ORDER — OLMESARTAN MEDOXOMIL 40 MG PO TABS
40.0000 mg | ORAL_TABLET | Freq: Every day | ORAL | Status: DC
  Administered 2012-03-06 – 2012-03-10 (×5): 40 mg via ORAL
  Filled 2012-03-06 (×5): qty 1

## 2012-03-06 SURGICAL SUPPLY — 56 items
BLADE SAW SAG 73X25 THK (BLADE) ×1
BLADE SAW SGTL 18X1.27X75 (BLADE) IMPLANT
BLADE SAW SGTL 73X25 THK (BLADE) ×1 IMPLANT
BLADE SAW SGTL MED 73X18.5 STR (BLADE) IMPLANT
BRUSH FEMORAL CANAL (MISCELLANEOUS) ×1 IMPLANT
CEMENT BONE DEPUY (Cement) ×2 IMPLANT
CEMENT RESTRICTOR DEPUY SZ 3 (Cement) ×1 IMPLANT
CLOTH BEACON ORANGE TIMEOUT ST (SAFETY) ×2 IMPLANT
COVER BACK TABLE 24X17X13 BIG (DRAPES) IMPLANT
COVER SURGICAL LIGHT HANDLE (MISCELLANEOUS) ×4 IMPLANT
DRAPE ORTHO SPLIT 77X108 STRL (DRAPES) ×2
DRAPE PROXIMA HALF (DRAPES) ×2 IMPLANT
DRAPE SURG ORHT 6 SPLT 77X108 (DRAPES) ×1 IMPLANT
DRAPE U-SHAPE 47X51 STRL (DRAPES) ×2 IMPLANT
DRILL BIT 7/64X5 (BIT) ×2 IMPLANT
DRSG MEPILEX BORDER 4X12 (GAUZE/BANDAGES/DRESSINGS) ×2 IMPLANT
DRSG MEPILEX BORDER 4X8 (GAUZE/BANDAGES/DRESSINGS) ×2 IMPLANT
DURAPREP 26ML APPLICATOR (WOUND CARE) ×2 IMPLANT
ELECT BLADE 4.0 EZ CLEAN MEGAD (MISCELLANEOUS)
ELECT REM PT RETURN 9FT ADLT (ELECTROSURGICAL) ×2
ELECTRODE BLDE 4.0 EZ CLN MEGD (MISCELLANEOUS) IMPLANT
ELECTRODE REM PT RTRN 9FT ADLT (ELECTROSURGICAL) ×1 IMPLANT
FLOSEAL 10ML (HEMOSTASIS) IMPLANT
GAUZE XEROFORM 1X8 LF (GAUZE/BANDAGES/DRESSINGS) ×2 IMPLANT
GLOVE BIO SURGEON STRL SZ7 (GLOVE) ×2 IMPLANT
GLOVE BIO SURGEON STRL SZ7.5 (GLOVE) ×2 IMPLANT
GLOVE BIOGEL PI IND STRL 7.0 (GLOVE) ×1 IMPLANT
GLOVE BIOGEL PI IND STRL 8 (GLOVE) ×1 IMPLANT
GLOVE BIOGEL PI INDICATOR 7.0 (GLOVE) ×1
GLOVE BIOGEL PI INDICATOR 8 (GLOVE) ×1
GOWN PREVENTION PLUS XLARGE (GOWN DISPOSABLE) ×2 IMPLANT
GOWN STRL NON-REIN LRG LVL3 (GOWN DISPOSABLE) ×4 IMPLANT
HANDPIECE INTERPULSE COAX TIP (DISPOSABLE) ×2
HOOD PEEL AWAY FACE SHEILD DIS (HOOD) ×4 IMPLANT
KIT BASIN OR (CUSTOM PROCEDURE TRAY) ×2 IMPLANT
KIT ROOM TURNOVER OR (KITS) ×2 IMPLANT
MANIFOLD NEPTUNE II (INSTRUMENTS) ×2 IMPLANT
NEEDLE 22X1 1/2 (OR ONLY) (NEEDLE) ×2 IMPLANT
NS IRRIG 1000ML POUR BTL (IV SOLUTION) ×2 IMPLANT
PACK TOTAL JOINT (CUSTOM PROCEDURE TRAY) ×2 IMPLANT
PAD ARMBOARD 7.5X6 YLW CONV (MISCELLANEOUS) ×4 IMPLANT
PASSER SUT SWANSON 36MM LOOP (INSTRUMENTS) ×2 IMPLANT
PRESSURIZER FEMORAL UNIV (MISCELLANEOUS) ×1 IMPLANT
SET HNDPC FAN SPRY TIP SCT (DISPOSABLE) IMPLANT
SUT ETHIBOND 2 V 37 (SUTURE) ×2 IMPLANT
SUT ETHILON 3 0 FSL (SUTURE) ×2 IMPLANT
SUT VIC AB 0 CTB1 27 (SUTURE) ×2 IMPLANT
SUT VIC AB 1 CTX 36 (SUTURE) ×2
SUT VIC AB 1 CTX36XBRD ANBCTR (SUTURE) ×1 IMPLANT
SUT VIC AB 2-0 CTB1 (SUTURE) ×2 IMPLANT
SYR CONTROL 10ML LL (SYRINGE) ×2 IMPLANT
TOWEL OR 17X24 6PK STRL BLUE (TOWEL DISPOSABLE) ×2 IMPLANT
TOWEL OR 17X26 10 PK STRL BLUE (TOWEL DISPOSABLE) ×2 IMPLANT
TOWER CARTRIDGE SMART MIX (DISPOSABLE) ×1 IMPLANT
TRAY FOLEY CATH 14FR (SET/KITS/TRAYS/PACK) IMPLANT
WATER STERILE IRR 1000ML POUR (IV SOLUTION) ×8 IMPLANT

## 2012-03-06 NOTE — Anesthesia Procedure Notes (Signed)
Procedure Name: Intubation Date/Time: 03/06/2012 11:44 AM Performed by: Luster Landsberg Pre-anesthesia Checklist: Patient identified, Emergency Drugs available, Suction available and Patient being monitored Patient Re-evaluated:Patient Re-evaluated prior to inductionOxygen Delivery Method: Circle system utilized Preoxygenation: Pre-oxygenation with 100% oxygen Intubation Type: IV induction Ventilation: Mask ventilation without difficulty and Oral airway inserted - appropriate to patient size Laryngoscope Size: Mac and 3 Grade View: Grade I Tube type: Oral Number of attempts: 1 Airway Equipment and Method: Stylet Placement Confirmation: ETT inserted through vocal cords under direct vision,  positive ETCO2 and breath sounds checked- equal and bilateral Secured at: 21 cm Tube secured with: Tape Dental Injury: Teeth and Oropharynx as per pre-operative assessment

## 2012-03-06 NOTE — Transfer of Care (Signed)
Immediate Anesthesia Transfer of Care Note  Patient: [name]  Procedure(s) Performed: Procedure(s) (LRB): TOTAL HIP ARTHROPLASTY (Left)  Patient Location: PACU  Anesthesia Type: General  Level of Consciousness: awake, alert  and oriented  Airway & Oxygen Therapy: Patient Spontanous Breathing and Patient connected to nasal cannula oxygen  Post-op Assessment: Report given to PACU RN, Post -op Vital signs reviewed and stable and Patient moving all extremities  Post vital signs: Reviewed and stable  Complications: No apparent anesthesia complications

## 2012-03-06 NOTE — Anesthesia Preprocedure Evaluation (Signed)
Anesthesia Evaluation  Patient identified by MRN, date of birth, ID band Patient awake    Reviewed: Allergy & Precautions, H&P , NPO status , Patient's Chart, lab work & pertinent test results  History of Anesthesia Complications (+) PONV  Airway Mallampati: I TM Distance: >3 FB Neck ROM: Full    Dental   Pulmonary          Cardiovascular  Mild AS and H/O pericarditis   Neuro/Psych    GI/Hepatic   Endo/Other  Diabetes mellitus-, Well Controlled, Type 2, Insulin Dependent  Renal/GU      Musculoskeletal   Abdominal   Peds  Hematology   Anesthesia Other Findings   Reproductive/Obstetrics                           Anesthesia Physical Anesthesia Plan  ASA: III  Anesthesia Plan: General   Post-op Pain Management:    Induction: Intravenous  Airway Management Planned: Oral ETT  Additional Equipment:   Intra-op Plan:   Post-operative Plan: Extubation in OR  Informed Consent: I have reviewed the patients History and Physical, chart, labs and discussed the procedure including the risks, benefits and alternatives for the proposed anesthesia with the patient or authorized representative who has indicated his/her understanding and acceptance.     Plan Discussed with: CRNA and Surgeon  Anesthesia Plan Comments:         Anesthesia Quick Evaluation

## 2012-03-06 NOTE — Op Note (Signed)
PATIENT ID:      MARYLEN ZUK  MRN:     [id number] DOB/AGE:    [birth year] / 78 y.o.       OPERATIVE REPORT    DATE OF PROCEDURE:  03/06/2012       PREOPERATIVE DIAGNOSIS:  LEFT HIP DEGENERATIVE JOINT DISEASE                                                       Estimated Body mass index is 35.19 kg/(m^2) as calculated from the following:   Height as of 01/27/12: 5\' 4" (1.626 m).   Weight as of 01/27/12: 205 lb(92.987 kg).     POSTOPERATIVE DIAGNOSIS:  LEFT HIP DEGENERATIVE JOINT DISEASE                                                           PROCEDURE:  L total hip arthroplasty using a 56 mm DePuy Pinnacle  Cup, [employer], 10-degree polyethylene liner index superior  and posterior, a +0 36 mm metal head, a #5 Summit basic stem, cemented double batch of DePuy HV cement with 1500 mg of Zinacef, 12 mm centralizer and #3 central canal occluder   SURGEON: Tynisha Ogan J    ASSISTANT:   Mauricia Area, PA-C  (present throughout entire procedure and necessary for timely completion of the procedure)   ANESTHESIA:  General  BLOOD LOSS: * No blood loss amount entered *  FLUID REPLACEMENT: 1800 crystalloid DRAINS: Foley Catheter  RINE OUTPUT: 300 COMPLICATIONS:  None    INDICATIONS FOR PROCEDURE: A 78 y.o. year-old @GENDER @  with end-stage arthritis of the L hip for 3 years, x-rays show bone-on-bone arthritic changes. Despite conservative measures with observation, anti-inflammatory medicine, narcotics, use of a cane, has severe unremitting pain and can ambulate only 1 blocks before resting.  Patient desires elective right total hip arthroplasty to decrease pain and increase function. The risks, benefits, and alternatives were discussed at length including but not limited to the risks of infection, bleeding, nerve injury, stiffness, blood clots, the need for revision surgery, cardiopulmonary complications, among others, and they were willing to proceed.y have been discussed. Questions  answered.     PROCEDURE IN DETAIL: The patient was identified by armband,  received preoperative IV antibiotics in the holding area at [facility], taken to the operating room , appropriate anesthetic monitors  were attached and general endotracheal anesthesia induced. Foley catheter was inserted. She was rolled into the R lateral decubitus position and fixed there with a Stulberg Mark II pelvic clamp and the L lower extremity was then prepped and draped  in the usual sterile fashion from the ankle to the hemipelvis. A time-out  procedure was performed. The skin along the lateral hip and thigh  infiltrated with 10 mL of 0.5% Marcaine and epinephrine solution. We  then made a posterolateral approach to the hip. With a #10 blade, 18 cm  incision through skin and subcutaneous tissue down to the level of the  IT band. Small bleeders were identified and cauterized. IT band cut in  line with skin incision exposing the greater trochanter. A Cobra retractor  was placed between the gluteus minimus and the superior hip joint capsule, and a spiked Cobra between the quadratus femoris and the inferior hip joint capsule. This isolated the short  external rotators and piriformis tendons. These were tagged with a #2 Ethibond  suture and cut off their insertion on the intertrochanteric crest. The posterior  capsule was then developed into an acetabular-based flap from Posterior Superior off of the acetabulum out over the femoral neck and back posterior inferior to the acetabular rim. This flap was tagged with two #2 Ethibond sutures and retracted protecting the sciatic nerve. This exposed the arthritic femoral head and osteophytes. The hip was then flexed and internally rotated, dislocating the femoral head and a standard neck cut performed 1 fingerbreadth above the lesser trochanter.  A spiked Cobra was placed in the cotyloid notch and a Hohmann retractor was then used to lever the femur anteriorly off of the  anterior pelvic column. A posterior-inferior wing retractor was placed at the junction of the acetabulum and the ischium completing the acetabular exposure.We then removed the peripheral osteophytes and labrum from the acetabulum. We then reamed the acetabulum up to 55 mm with basket reamers obtaining good coverage in all quadrants, irrigated out with normal  saline solution and hammered into place a 56 mm pinnacle cup in 45  degrees of abduction and about 20 degrees of anteversion. More  peripheral osteophytes removed and a trial 10-degree liner placed with the  index superior-posterior. The hip was then flexed and internally rotated exposing the  proximal femur, which was entered with the initiating reamer followed by  the tapered reamers up to a 4-5 tapered reamer and broaching up to a #5 broach, which had good fit and fill. A trial reduction was then  performed with a +0  36-mm ball on the standard neck and  excellent stability was noted with at 90 of flexion with 70 of  internal rotation and then full extension withexternal rotation. The hip  could not be dislocated in full extension. The knee could easily flex  to about 140 degrees. We also stretched the abductors at this point,  because of the preexisting adductor contractures. All trial components  were then removed. The acetabulum was irrigated out with normal saline  solution. A titanium [facility] was then screwed into place  followed by a 10-degree polyethylene liner index superior-posterior. On  the femoral side, we then sized for a #3 cement restrictor and irrigated  out the femoral canal with normal saline solution and dried with suction  and sponges. The cement restrictor was placed to the appropriate depth. A  double batch of DePuy 1 cement with 1500 mg of Zinacef was mixed and  injected into the canal under pressure followed by #5 Summit basic stem  in 20 degrees of anteversion and as this was held in place, excess  cement  was removed. At this point, a +0 36-mm metal head was  hammered on the stem. The hip was reduced. We checked our stability  one more time and found to be excellent. The wound was once again  thoroughly irrigated out with normal saline solution pulse lavage. The  capsular flap and short external rotators were repaired back to the  intertrochanteric crest through drill holes with a #2 Ethibond suture.  The IT band was closed with running 1 Vicryl suture. The subcutaneous  tissue with 0 and 2-0 undyed Vicryl suture and the skin with running  interlocking 3-0 nylon suture. Dressing of  Xeroform and Mepilex was  then applied. The patient was then unclamped, rolled supine, awaken extubated and taken to recovery room without difficulty in stable condition.   Kareema Keitt J 03/06/2012, 1:03 PM

## 2012-03-06 NOTE — Progress Notes (Signed)
Orthopedic Tech Progress Note Patient Details:  [name] 11/10/1930 [id number]  Patient ID: Raelyn Mora, female   DOB: 04-Oct-1931, 78 y.o.   MRN: [id number] Trapeze bar patient helper;viewed order from doctor's order list  Nikki Dom 03/06/2012, 8:52 PM

## 2012-03-06 NOTE — Anesthesia Postprocedure Evaluation (Signed)
Anesthesia Post Note  Patient: [name]  Procedure(s) Performed: Procedure(s) (LRB): TOTAL HIP ARTHROPLASTY (Left)  Anesthesia type: general  Patient location: PACU  Post pain: Pain level controlled  Post assessment: Patient's Cardiovascular Status Stable  Last Vitals:  Filed Vitals:   03/06/12 1422  BP: 129/38  Pulse: 59  Temp:   Resp: 16    Post vital signs: Reviewed and stable  Level of consciousness: sedated  Complications: No apparent anesthesia complications

## 2012-03-06 NOTE — Interval H&P Note (Signed)
History and Physical Interval Note:  03/06/2012 11:30 AM  [name]  has presented today for surgery, with the diagnosis of LEFT HIP DEGENERATIVE JOINT DISEASE  The various methods of treatment have been discussed with the patient and family. After consideration of risks, benefits and other options for treatment, the patient has consented to  Procedure(s) (LRB): TOTAL HIP ARTHROPLASTY (Left) as a surgical intervention .  The patients' history has been reviewed, patient examined, no change in status, stable for surgery.  I have reviewed the patients' chart and labs.  Questions were answered to the patient's satisfaction.     Nestor Lewandowsky

## 2012-03-07 LAB — GLUCOSE, CAPILLARY
Glucose-Capillary: 163 mg/dL — ABNORMAL HIGH (ref 70–99)
Glucose-Capillary: 238 mg/dL — ABNORMAL HIGH (ref 70–99)
Glucose-Capillary: 255 mg/dL — ABNORMAL HIGH (ref 70–99)
Glucose-Capillary: 164 mg/dL — ABNORMAL HIGH (ref 70–99)
Glucose-Capillary: 186 mg/dL — ABNORMAL HIGH (ref 70–99)

## 2012-03-07 LAB — CBC
HCT: 25.5 % — ABNORMAL LOW (ref 36.0–46.0)
Hemoglobin: 8.2 g/dL — ABNORMAL LOW (ref 12.0–15.0)
MCH: 28.5 pg (ref 26.0–34.0)
MCHC: 32.2 g/dL (ref 30.0–36.0)
MCV: 88.5 fL (ref 78.0–100.0)
Platelets: 165 10*3/uL (ref 150–400)
RBC: 2.88 MIL/uL — ABNORMAL LOW (ref 3.87–5.11)
RDW: 13.8 % (ref 11.5–15.5)
WBC: 5.8 10*3/uL (ref 4.0–10.5)

## 2012-03-07 LAB — BASIC METABOLIC PANEL
BUN: 20 mg/dL (ref 6–23)
CO2: 22 mEq/L (ref 19–32)
Calcium: 8.6 mg/dL (ref 8.4–10.5)
Chloride: 105 mEq/L (ref 96–112)
Creatinine, Ser: 1.16 mg/dL — ABNORMAL HIGH (ref 0.50–1.10)
GFR calc Af Amer: 51 mL/min — ABNORMAL LOW (ref >90)
GFR calc non Af Amer: 44 mL/min — ABNORMAL LOW (ref >90)
Glucose, Bld: 242 mg/dL — ABNORMAL HIGH (ref 70–99)
Potassium: 4.3 mEq/L (ref 3.5–5.1)
Sodium: 136 mEq/L (ref 135–145)

## 2012-03-07 NOTE — Progress Notes (Signed)
PATIENT ID: LANETA GUERIN  MRN: [id number]  DOB/AGE:  76/15/1932 / 78 y.o.  1 Day Post-Op Procedure(s) (LRB): TOTAL HIP ARTHROPLASTY (Left)  Subjective: Pain is well controlled.  Has not worked with PT yet. Ready to get up.    Objective: Vital signs in last 24 hours: Temp:  [91.8 F (33.2 C)-98.6 F (37 C)] 91.8 F (33.2 C) (05/04 0526) Pulse Rate:  [53-90] 74  (05/04 0526) Resp:  [10-20] 20  (05/04 0526) BP: (107-184)/(38-92) 163/66 mmHg (05/04 0526) SpO2:  [95 %-100 %] 99 % (05/04 0526) Weight:  [92.701 kg (204 lb 5.9 oz)] 92.701 kg (204 lb 5.9 oz) (05/03 1521)  Intake/Output from previous day: 05/03 0701 - 05/04 0700 In: 2540 [P.O.:240; I.V.:1800; IV Piggyback:500] Out: 2050 [Urine:1750; Blood:300] Intake/Output this shift:     Basename 03/07/12 0544 03/04/12 1433  HGB 8.2* 11.7*    Basename 03/07/12 0544 03/04/12 1433  WBC 5.8 7.6  RBC 2.88* 4.16  HCT 25.5* 36.8  PLT 165 235    Basename 03/07/12 0544 03/04/12 1433  NA 136 139  K 4.3 4.1  CL 105 104  CO2 22 25  BUN 20 32*  CREATININE 1.16* 1.35*  GLUCOSE 242* 154*  CALCIUM 8.6 9.3    Basename 03/04/12 1433  LABPT --  INR 1.01    Physical Exam: AOx3, sitting comfortably.  Dressing moderately saturated, changed this am. Incision clean, intact. Distally neurovascularly  Intact.  No calf TTP or swelling.  Assessment/Plan: 1 Day Post-Op Procedure(s) (LRB): TOTAL HIP ARTHROPLASTY (Left)   Doing very well 1 day postop total hip.  Will start with PT today. Hip precautions, WBAT Continue ASA for VTE prophylaxis  [name] 03/07/2012, 8:23 AM

## 2012-03-07 NOTE — Evaluation (Signed)
Physical Therapy Evaluation Patient Details Name: [name] MRN: [id number] DOB: 06/15/1931 Today's Date: 03/07/2012 Time: 0930-0950 PT Time Calculation (min): 20 min  PT Assessment / Plan / Recommendation Clinical Impression  Pt is a 77 y.o. female who underwent L THA 03-06-12.  Her d/c plan to short stay at [facility] prior to d/c home.  She would benefit from PT in acute care to progress ROM/strength LLE, increase mobility, and provide education.    PT Assessment  Patient needs continued PT services    Follow Up Recommendations  Skilled nursing facility    Equipment Recommendations  Defer to next venue    Frequency 7X/week    Precautions / Restrictions Precautions Precautions: Posterior Hip Restrictions Weight Bearing Restrictions: Yes LLE Weight Bearing: Weight bearing as tolerated          Mobility  Bed Mobility Bed Mobility: Supine to Sit Supine to Sit: 3: Mod assist Details for Bed Mobility Assistance: verbal cues for sequencing, hip precautions Transfers Transfers: Sit to Stand;Stand to Sit;Stand Pivot Transfers Sit to Stand: 4: Min assist;From bed;With upper extremity assist Stand to Sit: 3: Mod assist;To chair/3-in-1;With upper extremity assist;With armrests Stand Pivot Transfers: 4: Min assist (with RW) Details for Transfer Assistance: verbal cues for sequencing, assist to control descent with stand to sit Ambulation/Gait Ambulation/Gait Assistance: 4: Min guard Ambulation Distance (Feet): 3 Feet Assistive device: Rolling walker Ambulation/Gait Assistance Details: verbal cues for sequencing Gait Pattern: Antalgic;Decreased stance time - left;Step-to pattern Gait velocity: decreased    Exercises     PT Goals Acute Rehab PT Goals PT Goal Formulation: With patient Time For Goal Achievement: 03/14/12 Potential to Achieve Goals: Good Pt will go Supine/Side to Sit: with supervision;with HOB 0 degrees PT Goal: Supine/Side to Sit - Progress: Goal set  today Pt will go Sit to Supine/Side: with min assist;with HOB 0 degrees PT Goal: Sit to Supine/Side - Progress: Goal set today Pt will go Sit to Stand: with supervision;with upper extremity assist PT Goal: Sit to Stand - Progress: Goal set today Pt will go Stand to Sit: with supervision;with upper extremity assist PT Goal: Stand to Sit - Progress: Goal set today Pt will Transfer Bed to Chair/Chair to Bed: with supervision (with RW) PT Transfer Goal: Bed to Chair/Chair to Bed - Progress: Goal set today Pt will Ambulate: 51 - 150 feet;with rolling walker;with supervision PT Goal: Ambulate - Progress: Goal set today Pt will Perform Home Exercise Program: with supervision, verbal cues required/provided PT Goal: Perform Home Exercise Program - Progress: Goal set today Additional Goals Additional Goal #1: Pt will independently recall and adhere to 3/3 hip precautions. PT Goal: Additional Goal #1 - Progress: Goal set today  Visit Information  Last PT Received On: 03/07/12 Assistance Needed: +1    Subjective Data  Subjective: "I am ready." Patient Stated Goal: walk   Prior Functioning  Home Living Lives With: Spouse Available Help at Discharge: Family;Available 24 hours/day Type of Home: House Home Access: Stairs to enter [employer] of Steps: 3 Entrance Stairs-Rails: Right;Left;Can reach both Home Layout: Two level;Able to live on main level with bedroom/bathroom Home Adaptive Equipment: Bedside commode/3-in-1;Shower chair without back;Walker - rolling;Straight cane Prior Function Level of Independence: Independent with assistive device(s) Able to Take Stairs?: Yes Driving: No Vocation: Retired Musician: No difficulties    Cognition  Overall Cognitive Status: Appears within functional limits for tasks assessed/performed Arousal/Alertness: Awake/alert Orientation Level: Appears intact for tasks assessed Behavior During Session: [facility] for tasks  performed  Extremity/Trunk Assessment     Balance    End of Session PT - End of Session Equipment Utilized During Treatment: Gait belt Activity Tolerance: Patient tolerated treatment well Patient left: in chair;with call bell/phone within reach   Ilda Foil 03/07/2012, 11:48 AM  Aida Raider, PT  Office # [phone] Pager [phone]

## 2012-03-07 NOTE — Progress Notes (Signed)
Physical Therapy Treatment Patient Details Name: [name] MRN: [id number] DOB: [birth year] Today's Date: 03/07/2012 Time: 4098-1191 PT Time Calculation (min): 12 min  PT Assessment / Plan / Recommendation Comments on Treatment Session       Follow Up Recommendations  Skilled nursing facility    Equipment Recommendations  Defer to next venue    Frequency 7X/week   Plan Discharge plan remains appropriate;Frequency remains appropriate    Precautions / Restrictions Precautions Precautions: Posterior Hip Precaution Booklet Issued: Yes (comment) Precaution Comments: Reviewed 3/3 hip precautions. Restrictions LLE Weight Bearing: Weight bearing as tolerated        Mobility       Exercises Total Joint Exercises Ankle Circles/Pumps: AROM;Both;10 reps;Supine Quad Sets: AROM;Both;10 reps;Supine Gluteal Sets: AROM;Both;10 reps;Supine Heel Slides: AAROM;Left;10 reps;Supine Hip ABduction/ADduction: AAROM;Left;10 reps;Supine   PT Goals Acute Rehab PT Goals PT Goal Formulation: With patient Time For Goal Achievement: 03/14/12 Potential to Achieve Goals: Good Pt will go Supine/Side to Sit: with supervision;with HOB 0 degrees PT Goal: Supine/Side to Sit - Progress: Goal set today Pt will go Sit to Supine/Side: with min assist;with HOB 0 degrees PT Goal: Sit to Supine/Side - Progress: Goal set today Pt will go Sit to Stand: with supervision;with upper extremity assist PT Goal: Sit to Stand - Progress: Goal set today Pt will go Stand to Sit: with supervision;with upper extremity assist PT Goal: Stand to Sit - Progress: Goal set today Pt will Transfer Bed to Chair/Chair to Bed: with supervision (with RW) PT Transfer Goal: Bed to Chair/Chair to Bed - Progress: Goal set today Pt will Ambulate: 51 - 150 feet;with rolling walker;with supervision PT Goal: Ambulate - Progress: Goal set today Pt will Perform Home Exercise Program: with supervision, verbal cues required/provided PT Goal:  Perform Home Exercise Program - Progress: Goal set today Additional Goals Additional Goal #1: Pt will independently recall and adhere to 3/3 hip precautions. PT Goal: Additional Goal #1 - Progress: Goal set today  Visit Information  Last PT Received On: 03/07/12 Assistance Needed: +1    Subjective Data  Subjective: "I sat up for about an hour and a half this morning." Patient Stated Goal: walk   Cognition  Overall Cognitive Status: Appears within functional limits for tasks assessed/performed Arousal/Alertness: Awake/alert Orientation Level: Appears intact for tasks assessed Behavior During Session: [facility] for tasks performed    Balance     End of Session PT - End of Session Equipment Utilized During Treatment: Gait belt Activity Tolerance: Patient tolerated treatment well Patient left: in bed    Ilda Foil 03/07/2012, 3:39 PM

## 2012-03-08 LAB — CBC
HCT: 22.8 % — ABNORMAL LOW (ref 36.0–46.0)
Hemoglobin: 7.4 g/dL — ABNORMAL LOW (ref 12.0–15.0)
MCH: 28.1 pg (ref 26.0–34.0)
MCHC: 32.5 g/dL (ref 30.0–36.0)
MCV: 86.7 fL (ref 78.0–100.0)
Platelets: 140 10*3/uL — ABNORMAL LOW (ref 150–400)
RBC: 2.63 MIL/uL — ABNORMAL LOW (ref 3.87–5.11)
RDW: 14.1 % (ref 11.5–15.5)
WBC: 8.9 10*3/uL (ref 4.0–10.5)

## 2012-03-08 LAB — GLUCOSE, CAPILLARY
Glucose-Capillary: 153 mg/dL — ABNORMAL HIGH (ref 70–99)
Glucose-Capillary: 159 mg/dL — ABNORMAL HIGH (ref 70–99)
Glucose-Capillary: 167 mg/dL — ABNORMAL HIGH (ref 70–99)

## 2012-03-08 LAB — PREPARE RBC (CROSSMATCH)

## 2012-03-08 NOTE — Progress Notes (Signed)
Physical Therapy Treatment Patient Details Name: [name] MRN: [id number] DOB: January 18, 1931 Today's Date: 03/08/2012 Time: 0935-1000 PT Time Calculation (min): 25 min  PT Assessment / Plan / Recommendation Comments on Treatment Session  pt presents s/p L THA wit posterior hip precautions.  pt very motivated for OOB and ambulating, but fatigues quickly.      Follow Up Recommendations  Skilled nursing facility    Equipment Recommendations  Defer to next venue    Frequency 7X/week   Plan Discharge plan remains appropriate;Frequency remains appropriate    Precautions / Restrictions Precautions Precautions: Posterior Hip Precaution Booklet Issued: Yes (comment) Precaution Comments: Reviewed 3/3 hip precautions. Restrictions Weight Bearing Restrictions: Yes LLE Weight Bearing: Weight bearing as tolerated   Pertinent Vitals/Pain     Mobility  Bed Mobility Bed Mobility: Supine to Sit;Sitting - Scoot to Edge of Bed Supine to Sit: 3: Mod assist;With rails Sitting - Scoot to Edge of Bed: 3: Mod assist Details for Bed Mobility Assistance: cues for sequencing, A with L LE and trunk Transfers Transfers: Sit to Stand;Stand to Sit Sit to Stand: 4: Min assist;With upper extremity assist;From bed;From toilet Stand to Sit: 4: Min assist;With upper extremity assist;To chair/3-in-1;To toilet Details for Transfer Assistance: cues for use of UEs, anterior wt shift with sit to stand, control descent with sitting.  pt tends to lean posteriorly with sitting EOB and needs increased A for anterior wt shift.   Ambulation/Gait Ambulation/Gait Assistance: 4: Min guard Ambulation Distance (Feet): 15 Feet (15 x2) Assistive device: Rolling walker Ambulation/Gait Assistance Details: cues for positioning in RW, upright posture, sequencing gait.  pt fatigues quickly.   Gait Pattern: Antalgic;Decreased stance time - left;Step-to pattern Stairs: No Wheelchair Mobility Wheelchair Mobility: No    Exercises  Total Joint Exercises Ankle Circles/Pumps: AROM;Both;20 reps Short Arc Quad: AROM;Left;10 reps Hip ABduction/ADduction: AAROM;Left;10 reps Long Arc Quad: AROM;Both;15 reps   PT Goals Acute Rehab PT Goals PT Goal: Supine/Side to Sit - Progress: Progressing toward goal PT Goal: Sit to Stand - Progress: Progressing toward goal PT Goal: Stand to Sit - Progress: Progressing toward goal PT Goal: Ambulate - Progress: Progressing toward goal PT Goal: Perform Home Exercise Program - Progress: Progressing toward goal Additional Goals PT Goal: Additional Goal #1 - Progress: Progressing toward goal  Visit Information  Last PT Received On: 03/08/12 Assistance Needed: +1    Subjective Data  Subjective: I need to get out of this bed.   Patient Stated Goal: walk   Cognition  Overall Cognitive Status: Appears within functional limits for tasks assessed/performed Arousal/Alertness: Awake/alert Orientation Level: Appears intact for tasks assessed Behavior During Session: [facility] for tasks performed    Balance  Balance Balance Assessed: No  End of Session PT - End of Session Equipment Utilized During Treatment: Gait belt Activity Tolerance: Patient tolerated treatment well Patient left: in chair;with call bell/phone within reach Nurse Communication: Mobility status    Sunny Schlein, [place] [phone] 03/08/2012, 10:44 AM

## 2012-03-08 NOTE — Progress Notes (Addendum)
PATIENT ID: [name]  MRN: [id number]  DOB/AGE:  1931/01/23 / 78 y.o.  2 Days Post-Op Procedure(s) (LRB): TOTAL HIP ARTHROPLASTY (Left)  Subjective: Pain is mild.  Up in chair yesterday.  No CP or SOB.  Feeling a little fatigued today.  Objective: Vital signs in last 24 hours: Temp:  [98.6 F (37 C)-100.8 F (38.2 C)] 98.6 F (37 C) (05/05 0656) Pulse Rate:  [78-84] 84  (05/05 0656) Resp:  [18-20] 20  (05/05 0656) BP: (147-158)/(65-67) 158/65 mmHg (05/05 0656) SpO2:  [90 %-96 %] 90 % (05/05 0656)  Intake/Output from previous day: 05/04 0701 - 05/05 0700 In: 360 [P.O.:360] Out: 350 [Urine:350] Intake/Output this shift:     Basename 03/08/12 0500 03/07/12 0544  HGB 7.4* 8.2*    Basename 03/08/12 0500 03/07/12 0544  WBC 8.9 5.8  RBC 2.63* 2.88*  HCT 22.8* 25.5*  PLT 140* 165    Basename 03/07/12 0544  NA 136  K 4.3  CL 105  CO2 22  BUN 20  CREATININE 1.16*  GLUCOSE 242*  CALCIUM 8.6   No results found for this basename: LABPT:2,INR:2 in the last 72 hours  Physical Exam: Neurovascular intact Intact pulses distally Dorsiflexion/Plantar flexion intact Incision: dressing C/D/I  Assessment/Plan: 2 Days Post-Op Procedure(s) (LRB): TOTAL HIP ARTHROPLASTY (Left)   Up with therapy Weight Bearing as Tolerated (WBAT)  VTE prophylaxis: ECASA BID, SCDS ABLA: hgb down to 7.4 today. Mildly symptomatic, hx of CHF.  Transfuse one unit prbcs today and recheck h/h in am. UTI: finished 3 days of bactrim.  Feels may be upsetting stomach, will d/c at this point.   [name] 03/08/2012, 9:09 AM

## 2012-03-08 NOTE — Progress Notes (Signed)
Occupational Therapy Evaluation Patient Details Name: [name] MRN: [id number] DOB: 1931-09-26 Today's Date: 03/08/2012 Time: 4098-1191 OT Time Calculation (min): 17 min  OT Assessment / Plan / Recommendation Clinical Impression  Pt s/p L THA.  Will benefit from acute OT to address below problem list in prep for d/c to [facility] for rehab.    OT Assessment  Patient needs continued OT Services    Follow Up Recommendations  Skilled nursing facility    Equipment Recommendations  Defer to next venue    Frequency Min 2X/week    Precautions / Restrictions Precautions Precautions: Posterior Hip Precaution Booklet Issued: Yes (comment) Precaution Comments: Reviewed 3/3 hip precautions. Restrictions Weight Bearing Restrictions: Yes LLE Weight Bearing: Weight bearing as tolerated   Pertinent Vitals/Pain     ADL  Grooming: Wash/dry face;Teeth care;Wash/dry hands;Simulated;Other (comment) (min guard) Where Assessed - Grooming: Standing at sink Upper Body Bathing: Simulated;Set up Where Assessed - Upper Body Bathing: Sitting, chair Lower Body Bathing: Simulated;Maximal assistance Where Assessed - Lower Body Bathing: Sit to stand from chair Upper Body Dressing: Simulated;Set up Where Assessed - Upper Body Dressing: Sitting, chair Lower Body Dressing: Simulated;Maximal assistance Where Assessed - Lower Body Dressing: Sit to stand from chair Toilet Transfer: Simulated;Minimal assistance Toilet Transfer Method: Ambulating Toilet Transfer Equipment: Other (comment) (recliner) Equipment Used: Rolling walker Ambulation Related to ADLs: Pt ambulated within room with min guard assist and min cueing for maintaining hip precautions. ADL Comments: Discussed with patient importance of maintaining hip precautions during ADLs use of AE for LB bathing/dressing.    OT Goals Acute Rehab OT Goals OT Goal Formulation: With patient Time For Goal Achievement: 03/15/12 Potential to Achieve Goals:  Good ADL Goals Pt Will Perform Grooming: with set-up;Standing at sink ADL Goal: Grooming - Progress: Goal set today Pt Will Perform Lower Body Bathing: with supervision;Sit to stand from chair;Sit to stand from bed;with adaptive equipment ADL Goal: Lower Body Bathing - Progress: Goal set today Pt Will Perform Lower Body Dressing: with supervision;Sit to stand from chair;Sit to stand from bed;with adaptive equipment ADL Goal: Lower Body Dressing - Progress: Goal set today Pt Will Transfer to Toilet: with supervision;3-in-1;with DME;Ambulation;Maintaining hip precautions ADL Goal: Toilet Transfer - Progress: Goal set today Pt Will Perform Toileting - Clothing Manipulation: with supervision;Standing;with adaptive equipment (maintaining hip precautions) ADL Goal: Toileting - Clothing Manipulation - Progress: Goal set today Pt Will Perform Toileting - Hygiene: with supervision;Sit to stand from 3-in-1/toilet;with adaptive equipment (maintaining hip precautions) ADL Goal: Toileting - Hygiene - Progress: Goal set today Miscellaneous OT Goals Miscellaneous OT Goal #1: Pt will be able to verbalize and adhere to 3/3 posterior hip precautions during all ADLs. OT Goal: Miscellaneous Goal #1 - Progress: Goal set today  Visit Information  Last OT Received On: 03/08/12 Assistance Needed: +1    Subjective Data      Prior Functioning  Home Living Lives With: Spouse Available Help at Discharge: Family;Available 24 hours/day Type of Home: House Home Access: Stairs to enter [employer] of Steps: 3 Entrance Stairs-Rails: Right;Left;Can reach both Home Layout: Two level;Able to live on main level with bedroom/bathroom Bathroom Shower/Tub: Health visitor: Standard Home Adaptive Equipment: Bedside commode/3-in-1;Shower chair without back;Walker - rolling;Straight cane;Reacher Prior Function Level of Independence: Independent with assistive device(s) Able to Take Stairs?:  Yes Driving: No Vocation: Retired    IT consultant  Overall Cognitive Status: Appears within functional limits for tasks assessed/performed Arousal/Alertness: Awake/alert Orientation Level: Appears intact for tasks assessed Behavior During Session:  WFL for tasks performed    Extremity/Trunk Assessment Right Upper Extremity Assessment RUE ROM/Strength/Tone: Within functional levels RUE Sensation: WFL - Light Touch RUE Coordination: WFL - gross/fine motor Left Upper Extremity Assessment LUE ROM/Strength/Tone: Within functional levels LUE Sensation: WFL - Light Touch LUE Coordination: WFL - gross/fine motor   Mobility Bed Mobility Bed Mobility: Not assessed Transfers Transfers: Sit to Stand;Stand to Sit Sit to Stand: 4: Min assist;From chair/3-in-1;With armrests;With upper extremity assist Stand to Sit: 4: Min assist;To chair/3-in-1;With armrests;With upper extremity assist Details for Transfer Assistance: Cueing to control descent.  Pt intentionally plops into chair bc she reports she thinks it prevents pain.   Exercise    Balance    End of Session OT - End of Session Equipment Utilized During Treatment: Gait belt Activity Tolerance: Patient limited by fatigue Patient left: in chair;with call bell/phone within reach  03/08/2012 Cipriano Mile OTR/L Pager [phone] Office [phone]  Cipriano Mile 03/08/2012, 3:01 PM

## 2012-03-09 ENCOUNTER — Encounter (HOSPITAL_COMMUNITY): Payer: Self-pay | Admitting: Orthopedic Surgery

## 2012-03-09 LAB — CBC
HCT: 25.8 % — ABNORMAL LOW (ref 36.0–46.0)
Hemoglobin: 8.6 g/dL — ABNORMAL LOW (ref 12.0–15.0)
MCH: 28.8 pg (ref 26.0–34.0)
MCHC: 33.3 g/dL (ref 30.0–36.0)
MCV: 86.3 fL (ref 78.0–100.0)
Platelets: 142 10*3/uL — ABNORMAL LOW (ref 150–400)
RBC: 2.99 MIL/uL — ABNORMAL LOW (ref 3.87–5.11)
RDW: 14.1 % (ref 11.5–15.5)
WBC: 7.6 10*3/uL (ref 4.0–10.5)

## 2012-03-09 LAB — GLUCOSE, CAPILLARY
Glucose-Capillary: 104 mg/dL — ABNORMAL HIGH (ref 70–99)
Glucose-Capillary: 69 mg/dL — ABNORMAL LOW (ref 70–99)
Glucose-Capillary: 95 mg/dL (ref 70–99)
Glucose-Capillary: 98 mg/dL (ref 70–99)
Glucose-Capillary: 90 mg/dL (ref 70–99)
Glucose-Capillary: 102 mg/dL — ABNORMAL HIGH (ref 70–99)
Glucose-Capillary: 56 mg/dL — ABNORMAL LOW (ref 70–99)
Glucose-Capillary: 71 mg/dL (ref 70–99)
Glucose-Capillary: 115 mg/dL — ABNORMAL HIGH (ref 70–99)
Glucose-Capillary: 62 mg/dL — ABNORMAL LOW (ref 70–99)

## 2012-03-09 LAB — TYPE AND SCREEN
ABO/RH(D): A POS
Antibody Screen: NEGATIVE
Unit division: 0

## 2012-03-09 MED ORDER — TRAMADOL HCL 50 MG PO TABS
50.0000 mg | ORAL_TABLET | Freq: Four times a day (QID) | ORAL | Status: DC | PRN
  Administered 2012-03-09 – 2012-03-10 (×4): 50 mg via ORAL
  Filled 2012-03-09 (×4): qty 1

## 2012-03-09 MED ORDER — INSULIN GLARGINE 100 UNIT/ML ~~LOC~~ SOLN: 18.0000 [IU] | Freq: Every day | SUBCUTANEOUS | Status: DC

## 2012-03-09 NOTE — Progress Notes (Signed)
Notified Ruben Im PA of low blood sugar of 62 and after getting pt to eat somemore. Blood sugar came up to 96. Ordered to hold lantus insulin and let them address it in the AM. J. Amandine Covino,RN 03/09/12 23:45.

## 2012-03-09 NOTE — Progress Notes (Signed)
Physical Therapy Treatment Patient Details Name: [name] MRN: [id number] DOB: 1931/02/25 Today's Date: 03/09/2012 Time: 4098-1191 PT Time Calculation (min): 26 min  PT Assessment / Plan / Recommendation Comments on Treatment Session  Pt performed exercises  and mobility well this afternoon.  Needed a few cues from improved technique.  Will benefit from short term rehab before going home.    Follow Up Recommendations  Skilled nursing facility    Equipment Recommendations  Defer to next venue    Frequency 7X/week   Plan Discharge plan remains appropriate;Frequency remains appropriate    Precautions / Restrictions Precautions Precautions: Posterior Hip Precaution Booklet Issued: Yes (comment) Precaution Comments: Reviewed 3/3 hip precautions. (pt needed VC for hip to 90) Restrictions LLE Weight Bearing: Weight bearing as tolerated   Pertinent Vitals/Pain 6/10 pain in hip    Mobility  Bed Mobility Bed Mobility: Not assessed Transfers Transfers: Sit to Stand;Stand to Sit Sit to Stand: 4: Min guard;4: Min assist;From chair/3-in-1 (lower surface needed min, higher min guard) Stand to Sit: 4: Min guard;4: Min assist;To chair/3-in-1 (lower surface with no armrest min A o/w min guard) Details for Transfer Assistance: vc's for safe technique.  Leg out and hands back before sitting.  AND not too far bent forward Ambulation/Gait Ambulation/Gait Assistance: 5: Supervision Ambulation Distance (Feet): 125 Feet (times 2) Assistive device: Rolling walker Gait Pattern: Step-through pattern;Antalgic Gait velocity: decreased Stairs: Yes Stairs Assistance: 4: Min assist Stairs Assistance Details (indicate cue type and reason): visual/vc's for technique and assist with rail and/or RW; mild stability assist Stair Management Technique: One rail Left;Step to pattern;Forwards;With walker Number of Stairs: 2  Wheelchair Mobility Wheelchair Mobility: No    Exercises Total Joint  Exercises Quad Sets: AROM;Both;10 reps;Seated Heel Slides: AAROM;Left;10 reps;Other (comment) (reclined) Hip ABduction/ADduction: AAROM;Left;15 reps;Seated Straight Leg Raises: AROM;Right;10 reps;Seated Long Arc Quad: AROM;Left;10 reps;Seated   PT Goals Acute Rehab PT Goals PT Goal Formulation: With patient Time For Goal Achievement: 03/14/12 Potential to Achieve Goals: Good PT Goal: Sit to Stand - Progress: Progressing toward goal PT Goal: Stand to Sit - Progress: Progressing toward goal PT Transfer Goal: Bed to Chair/Chair to Bed - Progress: Progressing toward goal PT Goal: Ambulate - Progress: Progressing toward goal PT Goal: Perform Home Exercise Program - Progress: Progressing toward goal Additional Goals Additional Goal #1:   PT Goal: Additional Goal #1 - Progress: Progressing toward goal  Visit Information  Last PT Received On: 03/09/12 Assistance Needed: +1    Subjective Data      Cognition  Overall Cognitive Status: Appears within functional limits for tasks assessed/performed Arousal/Alertness: Awake/alert Orientation Level: Appears intact for tasks assessed Behavior During Session: [facility] for tasks performed    Balance  Balance Balance Assessed: No  End of Session PT - End of Session Activity Tolerance: Patient tolerated treatment well Patient left: in chair;with call bell/phone within reach Nurse Communication: Mobility status    Miquan Tandon, Eliseo Gum 03/09/2012, 4:12 PM  03/09/2012  [place] Bing, PT [phone] (pager)

## 2012-03-09 NOTE — Progress Notes (Signed)
Physical Therapy Treatment Patient Details Name: XIOMARA SEVILLANO MRN: [id number] DOB: Feb 20, 1931 Today's Date: 03/09/2012 Time: 4098-1191 PT Time Calculation (min): 10 min  PT Assessment / Plan / Recommendation Comments on Treatment Session  pt presents s/p L THA wit posterior hip precautions.  Treatment limited this morning due to pt having low blood sugar and not feeling well, but needed to getr to the bathroom so used the opportunity for some mobility and education.    Follow Up Recommendations  Skilled nursing facility    Equipment Recommendations  Defer to next venue    Frequency 7X/week   Plan Discharge plan remains appropriate;Frequency remains appropriate    Precautions / Restrictions Precautions Precautions: Posterior Hip Restrictions Weight Bearing Restrictions: Yes LLE Weight Bearing: Weight bearing as tolerated   Pertinent Vitals/Pain     Mobility  Bed Mobility Bed Mobility: Not assessed Transfers Transfers: Sit to Stand;Stand to Sit Stand to Sit: 4: Min guard;To chair/3-in-1 Details for Transfer Assistance: vc's for safe technique.  Leg out and hands back before sitting.  AND not too far bent forward Ambulation/Gait Ambulation/Gait Assistance: 5: Supervision Ambulation Distance (Feet): 15 Feet (x2) Assistive device: Rolling walker Ambulation/Gait Assistance Details: no cuing needed Gait Pattern: Step-to pattern Stairs: No    Exercises     PT Goals Acute Rehab PT Goals Time For Goal Achievement: 03/14/12 Potential to Achieve Goals: Good PT Goal: Sit to Stand - Progress: Progressing toward goal PT Goal: Stand to Sit - Progress: Progressing toward goal PT Transfer Goal: Bed to Chair/Chair to Bed - Progress: Progressing toward goal PT Goal: Ambulate - Progress: Progressing toward goal Additional Goals PT Goal: Additional Goal #1 - Progress: Progressing toward goal  Visit Information  Last PT Received On: 03/09/12 Assistance Needed: +1    Subjective  Data  Subjective: I need to get to the bathroom   Cognition  Overall Cognitive Status: Appears within functional limits for tasks assessed/performed Arousal/Alertness: Awake/alert Orientation Level: Appears intact for tasks assessed Behavior During Session: [facility] for tasks performed    Balance  Balance Balance Assessed: No  End of Session PT - End of Session Activity Tolerance: Patient tolerated treatment well Patient left: in chair;with call bell/phone within reach Nurse Communication: Mobility status    Merriam Brandner, Eliseo Gum 03/09/2012, 12:32 PM  03/09/2012  [place] Bing, PT [phone] (pager)

## 2012-03-09 NOTE — Plan of Care (Signed)
Problem: Consults Goal: Diagnosis- Total Joint Replacement Outcome: Completed/Met Date Met:  03/09/12 Primary Total Hip Left  Problem: Phase II Progression Outcomes Goal: Tolerating diet Outcome: Progressing Pt's po meal intake has been poor due to frequent nausea.

## 2012-03-09 NOTE — Progress Notes (Signed)
Subjective: 3 Days Post-Op Procedure(s) (LRB): TOTAL HIP ARTHROPLASTY (Left) Feels bad with N/V Activity level:  oob with pt  Diet tolerance:  Not much - n/v  Voiding:  ok Patient reports pain as 3 on 0-10 scale.    Objective: Vital signs in last 24 hours: Temp:  [97.2 F (36.2 C)-100.7 F (38.2 C)] 98.6 F (37 C) (05/06 0702) Pulse Rate:  [70-83] 83  (05/06 0702) Resp:  [16-22] 20  (05/06 0702) BP: (110-157)/(51-78) 157/75 mmHg (05/06 0702) SpO2:  [91 %-92 %] 92 % (05/06 0702)  Labs:  Basename 03/09/12 0525 03/08/12 0500 03/07/12 0544  HGB 8.6* 7.4* 8.2*    Basename 03/09/12 0525 03/08/12 0500  WBC 7.6 8.9  RBC 2.99* 2.63*  HCT 25.8* 22.8*  PLT 142* 140*    Basename 03/07/12 0544  NA 136  K 4.3  CL 105  CO2 22  BUN 20  CREATININE 1.16*  GLUCOSE 242*  CALCIUM 8.6   No results found for this basename: LABPT:2,INR:2 in the last 72 hours  Physical Exam:  Neurologically intact ABD soft Neurovascular intact Sensation intact distally Intact pulses distally Dorsiflexion/Plantar flexion intact No cellulitis present Compartment soft  Assessment/Plan:  3 Days Post-Op Procedure(s) (LRB): TOTAL HIP ARTHROPLASTY (Left) Up with therapy Plan for discharge tomorrowif n/v better to SNF. Will change pain med to ultram.  Will try for BM today ASA for DVT prevention 325 BID    Greysin Medlen R 03/09/2012, 9:20 AM

## 2012-03-09 NOTE — Progress Notes (Signed)
CBG: 56  Treatment: 15 GM carbohydrate snack  Symptoms: Shaky, Nauseated  Follow-up CBG: Time:1040 CBG Result:71  Possible Reasons for Event: Inadequate meal intake  Comments/MD notified:Pt has had hypoglycemic episode prior to this. Will continue to encourage pt to eat and will continue to monitor CBG closely.    Tammy Sours

## 2012-03-09 NOTE — Progress Notes (Signed)
UR COMPLETED  

## 2012-03-09 NOTE — Progress Notes (Signed)
  Inpatient Diabetes Program Recommendations  AACE/ADA: New Consensus Statement on Inpatient Glycemic Control (2009)  Target Ranges:  Prepandial:   less than 140 mg/dL      Peak postprandial:   less than 180 mg/dL (1-2 hours)      Critically ill patients:  140 - 180 mg/dL   Reason for Visit: Sub-optimal CBG's with episodes of hypoglycemia  Inpatient Diabetes Program Recommendations Insulin - Basal: Request Lantus dose be lowered temporarily to 20 units at HS. Correction (SSI): Suggest intensity of correction be lowered from moderate to sensitive. Insulin - Meal Coverage: Novolog 3 to 5 units ordered tid with meal.  Not eating enough to get meal coverage.  Request MD stop.  When meal coverage reordered, when patient eating better, would suggest 3 units tid instead of range order. Oral Agents: Request that glucotrol be stopped until patient's intake improves.  Results for ARANTXA, PIERCEY (MRN [id number]) as of 03/09/2012 16:52  Ref. Range 03/08/2012 23:36 03/09/2012 02:13 03/09/2012 02:39 03/09/2012 06:04 03/09/2012 10:25 03/09/2012 10:46 03/09/2012 16:29  Glucose-Capillary Latest Range: 70-99 mg/dL 409 (H) 69 (L) 90 811 (H) 56 (L) 71 115 (H)   May even consider lowering Lantus dose to 18 units. Cherlynn Popiel S. Elsie Lincoln, RN, CNS, CDE  [phone])

## 2012-03-09 NOTE — Progress Notes (Signed)
CBG: 69  Treatment: 15 GM carbohydrate snack  Symptoms: Shaky  Follow-up CBG: Time:0239 CBG Result: 90  Possible Reasons for Event: Inadequate meal intake   Comments/MD notified: no    [name]

## 2012-03-10 DIAGNOSIS — I2510 Atherosclerotic heart disease of native coronary artery without angina pectoris: Secondary | ICD-10-CM | POA: Diagnosis not present

## 2012-03-10 DIAGNOSIS — M129 Arthropathy, unspecified: Secondary | ICD-10-CM | POA: Diagnosis not present

## 2012-03-10 DIAGNOSIS — M4120 Other idiopathic scoliosis, site unspecified: Secondary | ICD-10-CM | POA: Diagnosis not present

## 2012-03-10 DIAGNOSIS — Z5189 Encounter for other specified aftercare: Secondary | ICD-10-CM | POA: Diagnosis not present

## 2012-03-10 DIAGNOSIS — IMO0002 Reserved for concepts with insufficient information to code with codable children: Secondary | ICD-10-CM | POA: Diagnosis not present

## 2012-03-10 DIAGNOSIS — E119 Type 2 diabetes mellitus without complications: Secondary | ICD-10-CM | POA: Diagnosis not present

## 2012-03-10 DIAGNOSIS — Z96649 Presence of unspecified artificial hip joint: Secondary | ICD-10-CM | POA: Diagnosis not present

## 2012-03-10 DIAGNOSIS — N189 Chronic kidney disease, unspecified: Secondary | ICD-10-CM | POA: Diagnosis not present

## 2012-03-10 DIAGNOSIS — M79609 Pain in unspecified limb: Secondary | ICD-10-CM | POA: Diagnosis not present

## 2012-03-10 DIAGNOSIS — I10 Essential (primary) hypertension: Secondary | ICD-10-CM | POA: Diagnosis not present

## 2012-03-10 DIAGNOSIS — Z794 Long term (current) use of insulin: Secondary | ICD-10-CM | POA: Diagnosis not present

## 2012-03-10 DIAGNOSIS — R0989 Other specified symptoms and signs involving the circulatory and respiratory systems: Secondary | ICD-10-CM | POA: Diagnosis not present

## 2012-03-10 DIAGNOSIS — Q762 Congenital spondylolisthesis: Secondary | ICD-10-CM | POA: Diagnosis not present

## 2012-03-10 DIAGNOSIS — Z471 Aftercare following joint replacement surgery: Secondary | ICD-10-CM | POA: Diagnosis not present

## 2012-03-10 DIAGNOSIS — M479 Spondylosis, unspecified: Secondary | ICD-10-CM | POA: Diagnosis not present

## 2012-03-10 DIAGNOSIS — R0609 Other forms of dyspnea: Secondary | ICD-10-CM | POA: Diagnosis not present

## 2012-03-10 DIAGNOSIS — R609 Edema, unspecified: Secondary | ICD-10-CM | POA: Diagnosis not present

## 2012-03-10 DIAGNOSIS — D649 Anemia, unspecified: Secondary | ICD-10-CM | POA: Diagnosis not present

## 2012-03-10 DIAGNOSIS — M25559 Pain in unspecified hip: Secondary | ICD-10-CM | POA: Diagnosis not present

## 2012-03-10 DIAGNOSIS — L539 Erythematous condition, unspecified: Secondary | ICD-10-CM | POA: Diagnosis not present

## 2012-03-10 LAB — URINALYSIS, ROUTINE W REFLEX MICROSCOPIC
Bilirubin Urine: NEGATIVE
Glucose, UA: NEGATIVE mg/dL
Hgb urine dipstick: NEGATIVE
Ketones, ur: NEGATIVE mg/dL
Leukocytes, UA: NEGATIVE
Nitrite: NEGATIVE
Protein, ur: NEGATIVE mg/dL
Specific Gravity, Urine: 1.015 (ref 1.005–1.030)
Urobilinogen, UA: 0.2 mg/dL (ref 0.0–1.0)
pH: 5 (ref 5.0–8.0)

## 2012-03-10 LAB — GLUCOSE, CAPILLARY
Glucose-Capillary: 69 mg/dL — ABNORMAL LOW (ref 70–99)
Glucose-Capillary: 96 mg/dL (ref 70–99)
Glucose-Capillary: 75 mg/dL (ref 70–99)
Glucose-Capillary: 85 mg/dL (ref 70–99)

## 2012-03-10 MED ORDER — CIPROFLOXACIN HCL 500 MG PO TABS: 500.0000 mg | ORAL_TABLET | Freq: Two times a day (BID) | ORAL | Status: AC

## 2012-03-10 MED ORDER — ASPIRIN 325 MG PO TABS: 325.0000 mg | ORAL_TABLET | Freq: Two times a day (BID) | ORAL | Status: DC

## 2012-03-10 MED ORDER — METHOCARBAMOL 500 MG PO TABS: 500.0000 mg | ORAL_TABLET | Freq: Four times a day (QID) | ORAL | Status: AC | PRN

## 2012-03-10 MED ORDER — HYDROCODONE-ACETAMINOPHEN 5-325 MG PO TABS: 1.0000 | ORAL_TABLET | ORAL | Status: AC | PRN

## 2012-03-10 NOTE — Clinical Social Work Placement (Signed)
    Clinical Social Work Department CLINICAL SOCIAL WORK PLACEMENT NOTE 03/10/2012  Patient:  [name], [name]  Account Number:  [id number] Admit date:  03/06/2012  Clinical Social Worker:  Lupita Leash Denese Mentink, BSW  Date/time:  03/10/2012 10:48 AM  Clinical Social Work is seeking post-discharge placement for this patient at the following level of care:   SKILLED NURSING   (*CSW will update this form in Epic as items are completed)   03/10/2012  Patient/family provided with [facility] Department of Clinical Social Work's list of facilities offering this level of care within the geographic area requested by the patient (or if unable, by the patient's family).    Patient/family informed of their freedom to choose among providers that offer the needed level of care, that participate in Medicare, Medicaid or managed care program needed by the patient, have an available bed and are willing to accept the patient.    Patient/family informed of [facility]' ownership interest in [facility], as well as of the fact that they are under no obligation to receive care at this facility.  PASARR submitted to EDS on  PASARR number received from EDS on   FL2 transmitted to all facilities in geographic area requested by pt/family on  03/10/2012 FL2 transmitted to all facilities within larger geographic area on   Patient informed that his/her managed care company has contracts with or will negotiate with  certain facilities, including the following:   Medicare     Patient/family informed of bed offers received:  03/10/2012 Patient chooses bed at [facility], [place] Physician recommends and patient chooses bed at    [facility] [place] on  03/10/2012 Patient to be transferred to facility by ambulance- [facility]  The following physician request were entered in Epic:   Additional Comments: Patient has a PASARR number in place. She  had been a former resident of both [place] of [address] and [address]. Full bed search was not performed based on her preferences.  Patient and husband are pleased with d/c plan. Nursing notified of d/c plan.

## 2012-03-10 NOTE — Discharge Summary (Signed)
Patient ID: [name] MRN: [id number] DOB/AGE: 04/09/1931 78 y.o.  Admit date: 03/06/2012 Discharge date: 03/10/2012  Admission Diagnoses:  Active Problems:  * No active hospital problems. *    Discharge Diagnoses:  Same  Past Medical History  Diagnosis Date  . Diabetes mellitus     insulin dependent  . Dyspnea   . Anasarca 04/2009    found to be secondary to pericardial effusion with tamponade/pericardial window done  . Hypertension   . Coronary artery disease   . Lumbar scoliosis   . Spondylolisthesis   . Spondylosis   . Lumbar radiculopathy   . Morbid obesity   . Pericardial effusion  04/2009     with tamponade  . Diastolic congestive heart failure      Chronic diastolic congestive heart failure  . Chronic kidney disease   . Herpes labialis     Healing herpes labialis  . Shortness of breath  04/20/2009  . Hypercholesterolemia   . PONV (postoperative nausea and vomiting)     has not had any problems since 2001  . Bladder infection     pt has been on antibiotic .for this  this week .     Surgeries: Procedure(s): TOTAL HIP ARTHROPLASTY on 03/06/2012   Consultants:    Discharged Condition: Improved  Hospital Course: SUMAYYAH CUSTODIO is an 78 y.o. female who was admitted 03/06/2012 for operative treatment of<principal problem not specified>. Patient has severe unremitting pain that affects sleep, daily activities, and work/hobbies. After pre-op clearance the patient was taken to the operating room on 03/06/2012 and underwent  Procedure(s): TOTAL HIP ARTHROPLASTY.    Patient was given perioperative antibiotics: Anti-infectives     Start     Dose/Rate Route Frequency Ordered Stop   03/10/12 0000   ciprofloxacin (CIPRO) 500 MG tablet        500 mg Oral 2 times daily 03/10/12 0751 03/20/12 2359   03/06/12 2200   sulfamethoxazole-trimethoprim (BACTRIM,SEPTRA) 400-80 MG per tablet 1 tablet  Status:  Discontinued        1 tablet Oral Every 12 hours 03/06/12 2044 03-15-2012 0909    03/06/12 1228   cefUROXime (ZINACEF) injection  Status:  Discontinued          As needed 03/06/12 1229 03/06/12 1403   03/05/12 1422   ceFAZolin (ANCEF) IVPB 2 g/50 mL premix        2 g 100 mL/hr over 30 Minutes Intravenous 60 min pre-op 03/05/12 1422 03/06/12 1146           Patient was given sequential compression devices, early ambulation, and chemoprophylaxis to prevent DVT.  Patient benefited maximally from hospital stay and there were no complications.    Recent vital signs: Patient Vitals for the past 24 hrs:  BP Temp Temp src Pulse Resp SpO2  03/10/12 0750 - 97.9 F (36.6 C) Oral - - -  03/10/12 0628 154/62 mmHg 101.5 F (38.6 C) - 92  20  93 %  03/09/12 2218 132/53 mmHg 98.6 F (37 C) - 80  20  90 %  03/09/12 1433 134/45 mmHg 98.3 F (36.8 C) - 80  16  92 %     Recent laboratory studies:  Basename 03/09/12 0525 2012-03-15 0500  WBC 7.6 8.9  HGB 8.6* 7.4*  HCT 25.8* 22.8*  PLT 142* 140*  NA -- --  K -- --  CL -- --  CO2 -- --  BUN -- --  CREATININE -- --  GLUCOSE -- --  INR -- --  CALCIUM -- --     Discharge Medications:   Medication List  As of 03/10/2012  7:53 AM   STOP taking these medications         aspirin 81 MG chewable tablet      glipiZIDE 5 MG tablet         TAKE these medications         amLODipine 10 MG tablet   Commonly known as: NORVASC   Take 5 mg by mouth daily.      aspirin 325 MG tablet   Take 1 tablet (325 mg total) by mouth 2 (two) times daily after a meal.      atorvastatin 80 MG tablet   Commonly known as: LIPITOR   Take 40 mg by mouth every evening.      ciprofloxacin 500 MG tablet   Commonly known as: CIPRO   Take 1 tablet (500 mg total) by mouth 2 (two) times daily.      ergocalciferol 50000 UNITS capsule   Commonly known as: VITAMIN D2   Take 50,000 Units by mouth once a week. On friday      furosemide 80 MG tablet   Commonly known as: LASIX   Take 80 mg by mouth every morning.       HYDROcodone-acetaminophen 10-325 MG per tablet   Commonly known as: NORCO   Take 10-325 mg by mouth every 8 (eight) hours as needed. For pain      HYDROcodone-acetaminophen 5-325 MG per tablet   Commonly known as: NORCO   Take 1-2 tablets by mouth every 4 (four) hours as needed.      insulin aspart 100 UNIT/ML injection   Commonly known as: novoLOG   Inject 3-5 Units into the skin 3 (three) times daily before meals. Sliding scale      insulin glargine 100 UNIT/ML injection   Commonly known as: LANTUS   Inject 22 Units into the skin at bedtime.      IRON PO   Take 1 tablet by mouth every other day.      methocarbamol 500 MG tablet   Commonly known as: ROBAXIN   Take 1 tablet (500 mg total) by mouth every 6 (six) hours as needed.      metoprolol tartrate 25 MG tablet   Commonly known as: LOPRESSOR   Take 25 mg by mouth 2 (two) times daily.      pioglitazone 15 MG tablet   Commonly known as: ACTOS   Take 15 mg by mouth daily.      valsartan 320 MG tablet   Commonly known as: DIOVAN   Take 320 mg by mouth daily.            Diagnostic Studies: Dg Pelvis Portable  03/06/2012  *RADIOLOGY REPORT*  Clinical Data: Status post left total hip arthroplasty.  PORTABLE PELVIS,PORTABLE LEFT HIP - 1 VIEW  Comparison: 02/24/2012.  Findings: Compared to the prior examination, there has been a left total hip arthroplasty.  Both the femoral and acetabular components of the prosthesis appear to be well seated without obvious periprosthetic fracture or other definite acute complicating features.  There is gas in the joint space and overlying soft tissues.  The prosthetic femoral head is properly located within the prosthetic acetabular cup.  Visualized portions of the bony pelvis are unremarkable.  IMPRESSION: 1.  Postoperative changes of left total hip arthroplasty, as above, without acute complicating features.  Original Report Authenticated By: Florencia Reasons,  M.D.   Dg Hip Portable 1 View  Left  03/06/2012  *RADIOLOGY REPORT*  Clinical Data: Status post left total hip arthroplasty.  PORTABLE PELVIS,PORTABLE LEFT HIP - 1 VIEW  Comparison: 02/24/2012.  Findings: Compared to the prior examination, there has been a left total hip arthroplasty.  Both the femoral and acetabular components of the prosthesis appear to be well seated without obvious periprosthetic fracture or other definite acute complicating features.  There is gas in the joint space and overlying soft tissues.  The prosthetic femoral head is properly located within the prosthetic acetabular cup.  Visualized portions of the bony pelvis are unremarkable.  IMPRESSION: 1.  Postoperative changes of left total hip arthroplasty, as above, without acute complicating features.  Original Report Authenticated By: Florencia Reasons, M.D.    Disposition: 07-Left Against Medical Advice  Discharge Orders    Future Orders Please Complete By Expires   Increase activity slowly      Walker       May shower / Bathe      Driving Restrictions      Comments:   No driving for 2 weeks.   Change dressing (specify)      Comments:   Dressing change as needed.   Call MD for:  temperature >100.4      Call MD for:  severe uncontrolled pain      Call MD for:  redness, tenderness, or signs of infection (pain, swelling, redness, odor or green/yellow discharge around incision site)      Discharge instructions      Comments:   F/U with Dr. Turner Daniels in 10 days         Signed: Hazle Nordmann. 03/10/2012, 7:53 AM

## 2012-03-10 NOTE — Clinical Social Work Psychosocial (Signed)
    Clinical Social Work Department BRIEF PSYCHOSOCIAL ASSESSMENT 03/10/2012  Patient:  AMARRA, SAWYER     Account Number:  [id number]     Admit date:  03/06/2012  Clinical Social Worker:  Burnard Hawthorne  Date/Time:  03/10/2012 10:28 AM  Referred by:  Physician  Date Referred:  03/09/2012 Referred for  SNF Placement   Other Referral:   Interview type:  Patient Other interview type:    PSYCHOSOCIAL DATA Living Status:  HUSBAND Admitted from facility:   Level of care:   Primary support name:  Joanie Coddington Primary support relationship to patient:  SPOUSE Degree of support available:   Husband is very supportive per patient.    CURRENT CONCERNS Current Concerns  Post-Acute Placement   Other Concerns:   None- she has been placed in 2 SNFs in the past- [facility] and [facility]. States she had positive experiences in both places.    SOCIAL WORK ASSESSMENT / PLAN Patient referred to SNFs- [facility] and [facility]. Both are preferences per patient with first choice- [facility]. For short term SNF- then return home.   Assessment/plan status:  Psychosocial Support/Ongoing Assessment of Needs Other assessment/ plan:   NA   Information/referral to community resources:   NA    PATIENT'S/FAMILY'S RESPONSE TO PLAN OF CARE: Patient has a very positive attitude and states that she want SNF placement. Very pleasant lady.

## 2012-03-10 NOTE — Progress Notes (Signed)
PATIENT ID: [name]  MRN: [id number]  DOB/AGE:  [birth year] / 78 y.o.  4 Days Post-Op Procedure(s) (LRB): TOTAL HIP ARTHROPLASTY (Left)    PROGRESS NOTE Subjective: Patient is alert, oriented,noNausea, no Vomiting, yes passing gas, no Bowel Movement. Taking PO well. Denies SOB, Chest or Calf Pain. Using Incentive Spirometer, PAS in place. Ambulating well with PT. Patient reports pain as moderate  .    Objective: Vital signs in last 24 hours: Filed Vitals:   03/09/12 0702 03/09/12 1433 03/09/12 2218 03/10/12 0628  BP: 157/75 134/45 132/53 154/62  Pulse: 83 80 80 92  Temp: 98.6 F (37 C) 98.3 F (36.8 C) 98.6 F (37 C) 101.5 F (38.6 C)  TempSrc:      Resp: 20 16 20 20   Height:      Weight:      SpO2: 92% 92% 90% 93%      Intake/Output from previous day: I/O last 3 completed shifts: In: 720 [P.O.:720] Out: -    Intake/Output this shift:     LABORATORY DATA:  Basename 03/10/12 0558 03/10/12 0429 03/10/12 0141 03/09/12 0525 03/08/12 0500  WBC -- -- -- 7.6 8.9  HGB -- -- -- 8.6* 7.4*  HCT -- -- -- 25.8* 22.8*  PLT -- -- -- 142* 140*  NA -- -- -- -- --  K -- -- -- -- --  CL -- -- -- -- --  CO2 -- -- -- -- --  BUN -- -- -- -- --  CREATININE -- -- -- -- --  GLUCOSE -- -- -- -- --  GLUCAP 85 75 69* -- --  INR -- -- -- -- --  CALCIUM -- -- -- -- --    Examination: Neurologically intact ABD soft Neurovascular intact Sensation intact distally Intact pulses distally Dorsiflexion/Plantar flexion intact Incision: scant drainage} XR AP&Lat of hip shows well placed\fixed THA  Assessment:   4 Days Post-Op Procedure(s) (LRB): TOTAL HIP ARTHROPLASTY (Left) ADDITIONAL DIAGNOSIS:  UTI Preop  Plan: PT/OT WBAT, THA  posterior precautions  DVT Prophylaxis: Aspirin 325 mg BID x 2 weeks.  DISCHARGE PLAN: Skilled Nursing Facility/Rehab today if bed available.    DISCHARGE NEEDS: HHPT, HHRN, Walker and 3-in-1 comode seat

## 2012-03-10 NOTE — Progress Notes (Signed)
PT Progress Note:     03/10/12 1000  PT Visit Information  Last PT Received On 03/10/12  Assistance Needed +1  PT Time Calculation  PT Start Time 0748  PT Stop Time 0815  PT Time Calculation (min) 27 min  Precautions  Precautions Posterior Hip  Precaution Booklet Issued Yes (comment)  Precaution Comments Reviewed 3/3 hip precautions. (Pt required VC's for no bending >90 at hip)  Restrictions  LLE Weight Bearing WBAT  Cognition  Overall Cognitive Status Appears within functional limits for tasks assessed/performed  Arousal/Alertness Awake/alert  Orientation Level Appears intact for tasks assessed  Behavior During Session [facility] for tasks performed  Bed Mobility  Bed Mobility Supine to Sit;Sitting - Scoot to Edge of Bed  Supine to Sit 4: Min assist;HOB flat  Sitting - Scoot to [place] of Bed 5: Supervision  Details for Bed Mobility Assistance Cues for sequencing & technique.  (A) for LLE & trunk to sitting upright.    Transfers  Transfers Sit to Stand;Stand to Sit  Sit to Stand With upper extremity assist;From bed;5: Supervision  Stand to Sit 5: Supervision;With armrests;With upper extremity assist;To chair/3-in-1  Details for Transfer Assistance Cues for hand placement & LLE positioning.    Ambulation/Gait  Ambulation/Gait Assistance 5: Supervision  Ambulation Distance (Feet) 150 Feet  Assistive device Rolling walker  Ambulation/Gait Assistance Details Cues to increase stride lenth  Gait Pattern Step-through pattern;Decreased stride length;Antalgic  Gait velocity decreased  Stairs No  Wheelchair Mobility  Wheelchair Mobility No  Exercises  Exercises Total Joint  Total Joint Exercises  Ankle Circles/Pumps AROM;Both;20 reps  Gluteal Sets AROM;Both;Other reps (comment);Supine (12 reps)  Heel Slides AAROM;Strengthening;Left;15 reps;Supine  Hip ABduction/ADduction AAROM;Left;15 reps;Supine  Long Arc Quad AROM;Left;15 reps;Seated  Marching in Standing  AAROM;Strengthening;Left;Other reps (comment);Seated (15 reps)  PT - End of Session  Equipment Utilized During Treatment Gait belt  Activity Tolerance Patient tolerated treatment well;Patient limited by pain  Patient left in chair;with call bell/phone within reach  Nurse Communication Mobility status  PT - Assessment/Plan  Comments on Treatment Session Pt making steady progress with PT goals.  Plans are for pt to possibly d/c to SNF facility today.    PT Plan Discharge plan remains appropriate;Frequency remains appropriate  PT Frequency 7X/week  Follow Up Recommendations Skilled nursing facility  Equipment Recommended Defer to next venue  Acute Rehab PT Goals  PT Goal: Supine/Side to Sit - Progress Progressing toward goal  PT Goal: Sit to Stand - Progress Met  PT Goal: Stand to Sit - Progress Met  PT Goal: Ambulate - Progress Met  PT Goal: Perform Home Exercise Program - Progress Progressing toward goal      [name], [name] [phone] 03/10/2012

## 2012-03-12 LAB — GLUCOSE, CAPILLARY
Glucose-Capillary: 39 mg/dL — CL (ref 70–99)
Glucose-Capillary: 61 mg/dL — ABNORMAL LOW (ref 70–99)
Glucose-Capillary: 92 mg/dL (ref 70–99)

## 2012-03-14 DIAGNOSIS — D649 Anemia, unspecified: Secondary | ICD-10-CM | POA: Diagnosis not present

## 2012-03-14 DIAGNOSIS — E119 Type 2 diabetes mellitus without complications: Secondary | ICD-10-CM | POA: Diagnosis not present

## 2012-03-14 DIAGNOSIS — Z96649 Presence of unspecified artificial hip joint: Secondary | ICD-10-CM | POA: Diagnosis not present

## 2012-03-14 DIAGNOSIS — N189 Chronic kidney disease, unspecified: Secondary | ICD-10-CM | POA: Diagnosis not present

## 2012-03-20 DIAGNOSIS — M25559 Pain in unspecified hip: Secondary | ICD-10-CM | POA: Diagnosis not present

## 2012-03-23 NOTE — H&P (Signed)
ADDENDUM:  ROS: Patient denies dizziness, nausea, fever, chills, vomiting, shortness of breath, chest pain, loss of appetite, or rash.    PHYSICAL EXAM: Well-developed, well-nourished.  Awake, alert, and oriented x3.  Extraocular motion is intact.  No use of accessory respiratory muscles for breathing.   Cardiovascular exam reveals a regular rhythm.  Skin is intact without cuts, scrapes, or abrasions.

## 2012-04-09 DIAGNOSIS — H902 Conductive hearing loss, unspecified: Secondary | ICD-10-CM | POA: Diagnosis not present

## 2012-04-09 DIAGNOSIS — H6120 Impacted cerumen, unspecified ear: Secondary | ICD-10-CM | POA: Diagnosis not present

## 2012-04-16 DIAGNOSIS — M25559 Pain in unspecified hip: Secondary | ICD-10-CM | POA: Diagnosis not present

## 2012-04-23 DIAGNOSIS — D631 Anemia in chronic kidney disease: Secondary | ICD-10-CM | POA: Diagnosis not present

## 2012-04-23 DIAGNOSIS — E782 Mixed hyperlipidemia: Secondary | ICD-10-CM | POA: Diagnosis not present

## 2012-04-23 DIAGNOSIS — E1142 Type 2 diabetes mellitus with diabetic polyneuropathy: Secondary | ICD-10-CM | POA: Diagnosis not present

## 2012-04-23 DIAGNOSIS — N039 Chronic nephritic syndrome with unspecified morphologic changes: Secondary | ICD-10-CM | POA: Diagnosis not present

## 2012-04-23 DIAGNOSIS — E1149 Type 2 diabetes mellitus with other diabetic neurological complication: Secondary | ICD-10-CM | POA: Diagnosis not present

## 2012-04-23 DIAGNOSIS — E559 Vitamin D deficiency, unspecified: Secondary | ICD-10-CM | POA: Diagnosis not present

## 2012-04-23 DIAGNOSIS — N3090 Cystitis, unspecified without hematuria: Secondary | ICD-10-CM | POA: Diagnosis not present

## 2012-04-28 DIAGNOSIS — D631 Anemia in chronic kidney disease: Secondary | ICD-10-CM | POA: Diagnosis not present

## 2012-04-28 DIAGNOSIS — E1149 Type 2 diabetes mellitus with other diabetic neurological complication: Secondary | ICD-10-CM | POA: Diagnosis not present

## 2012-04-28 DIAGNOSIS — E1139 Type 2 diabetes mellitus with other diabetic ophthalmic complication: Secondary | ICD-10-CM | POA: Diagnosis not present

## 2012-04-28 DIAGNOSIS — E1142 Type 2 diabetes mellitus with diabetic polyneuropathy: Secondary | ICD-10-CM | POA: Diagnosis not present

## 2012-04-28 DIAGNOSIS — N182 Chronic kidney disease, stage 2 (mild): Secondary | ICD-10-CM | POA: Diagnosis not present

## 2012-04-28 DIAGNOSIS — E11339 Type 2 diabetes mellitus with moderate nonproliferative diabetic retinopathy without macular edema: Secondary | ICD-10-CM | POA: Diagnosis not present

## 2012-04-28 DIAGNOSIS — I10 Essential (primary) hypertension: Secondary | ICD-10-CM | POA: Diagnosis not present

## 2012-04-28 DIAGNOSIS — E782 Mixed hyperlipidemia: Secondary | ICD-10-CM | POA: Diagnosis not present

## 2012-06-15 ENCOUNTER — Encounter: Payer: Self-pay | Admitting: Cardiology

## 2012-06-15 ENCOUNTER — Ambulatory Visit (INDEPENDENT_AMBULATORY_CARE_PROVIDER_SITE_OTHER): Payer: Medicare Other | Admitting: Cardiology

## 2012-06-15 VITALS — BP 140/78 | HR 60 | Ht 64.0 in | Wt 206.0 lb

## 2012-06-15 DIAGNOSIS — I119 Hypertensive heart disease without heart failure: Secondary | ICD-10-CM | POA: Diagnosis not present

## 2012-06-15 DIAGNOSIS — I509 Heart failure, unspecified: Secondary | ICD-10-CM

## 2012-06-15 DIAGNOSIS — M5430 Sciatica, unspecified side: Secondary | ICD-10-CM

## 2012-06-15 DIAGNOSIS — I10 Essential (primary) hypertension: Secondary | ICD-10-CM

## 2012-06-15 DIAGNOSIS — I5032 Chronic diastolic (congestive) heart failure: Secondary | ICD-10-CM | POA: Diagnosis not present

## 2012-06-15 DIAGNOSIS — R609 Edema, unspecified: Secondary | ICD-10-CM

## 2012-06-15 NOTE — Assessment & Plan Note (Signed)
The patient has not been having any dizzy spells or headaches.  She has had a recent discomfort in her left ear and has seen Dr. Narda Bonds.

## 2012-06-15 NOTE — Assessment & Plan Note (Signed)
The patient has not been having any problems from her diastolic heart failure.  She made it through her surgery without any difficulty back in may 2013.  She has had some recent peripheral edema and weight gain which she attributes to dietary indiscretion over the weekend with added salt from restaurant food.

## 2012-06-15 NOTE — Progress Notes (Signed)
[name] Date of Birth:  25-Dec-1930 [facility] [address] [place], [place]  [address] [phone]         Fax   [phone]  History of Present Illness: This pleasant 78 year old woman is seen for a scheduled four-month followup office visit.  She has a past history of diastolic congestive heart failure.  Her last echocardiogram on 02/10/12 showed an ejection fraction of 60% with normal systolic function and grade 1 diastolic dysfunction.  She has a past history of pericardial effusion with pain but not in 2010 and underwen a pericardial window with good results she has a history of mild aortic stenosis.  Since we last saw her she underwent left hip replacement by Dr. Turner Daniels and has done well  Current Outpatient Prescriptions  Medication Sig Dispense Refill  . amLODipine (NORVASC) 10 MG tablet Take 5 mg by mouth daily.       Marland Kitchen aspirin 325 MG tablet Take 81 mg by mouth daily.      Marland Kitchen atorvastatin (LIPITOR) 80 MG tablet Take 40 mg by mouth every evening.      . ergocalciferol (VITAMIN D2) 50000 UNITS capsule Take 50,000 Units by mouth once a week. On friday      . furosemide (LASIX) 80 MG tablet Take 80 mg by mouth every morning.      . insulin aspart (NOVOLOG) 100 UNIT/ML injection Inject 3-5 Units into the skin 3 (three) times daily before meals. Sliding scale      . insulin glargine (LANTUS) 100 UNIT/ML injection Inject 22 Units into the skin at bedtime.       . IRON PO Take 1 tablet by mouth daily.       . metoprolol tartrate (LOPRESSOR) 25 MG tablet Take 25 mg by mouth 2 (two) times daily.      . pioglitazone (ACTOS) 15 MG tablet Take 15 mg by mouth daily.      . valsartan (DIOVAN) 320 MG tablet Take 320 mg by mouth daily.       Marland Kitchen GLIPIZIDE XL 5 MG 24 hr tablet Take 5 mg by mouth Daily.      . traMADol (ULTRAM) 50 MG tablet as directed.        Allergies  Allergen Reactions  . Codeine Nausea And Vomiting    Hydrocodone is ok  . Gabapentin     neuropathy    . Lactose Intolerance (Gi) Diarrhea  . Lodine (Etodolac) Other (See Comments)    dizziness  . Percocet (Oxycodone-Acetaminophen) Other (See Comments)    Percocet does not relieve pts' pain.   [name] Drugs Cross Reactors Nausea Only    Patient Active Problem List  Diagnosis  . Coronary artery disease  . Hypertension  . Dyspnea  . Anasarca  . Chronic diastolic congestive heart failure  . Dyslipidemia  . Diabetes mellitus  . Sciatica neuralgia    History  Smoking status  . Never Smoker   Smokeless tobacco  . Not on file    History  Alcohol Use No    Family History  Problem Relation Age of Onset  . Heart attack Father   . Hypertension Father   . Stroke Mother   . Angina Mother   . Coronary artery disease      Review of Systems: Constitutional: no fever chills diaphoresis or fatigue or change in weight.  Head and neck: no hearing loss, no epistaxis, no photophobia or visual disturbance. Respiratory: No cough, shortness of breath or  wheezing. Cardiovascular: No chest pain peripheral edema, palpitations. Gastrointestinal: No abdominal distention, no abdominal pain, no change in bowel habits hematochezia or melena. Genitourinary: No dysuria, no frequency, no urgency, no nocturia. Musculoskeletal:No arthralgias, no back pain, no gait disturbance or myalgias. Neurological: No dizziness, no headaches, no numbness, no seizures, no syncope, no weakness, no tremors. Hematologic: No lymphadenopathy, no easy bruising. Psychiatric: No confusion, no hallucinations, no sleep disturbance.    Physical Exam: Filed Vitals:   06/15/12 1057  BP: 140/78  Pulse: 60   the general appearance reveals a well-developed well-nourished Caucasian female in no distress.The head and neck exam reveals pupils equal and reactive.  Extraocular movements are full.  There is no scleral icterus.  The mouth and pharynx are normal.  The neck is supple.  The carotids reveal no bruits.  The jugular  venous pressure is normal.  The  thyroid is not enlarged.  There is no lymphadenopathy.  The chest is clear to percussion and auscultation.  There are no rales or rhonchi.  Expansion of the chest is symmetrical.  The precordium is quiet.  The first heart sound is normal.  The second heart sound is physiologically split.  There is no murmur gallop rub or click.  There is no abnormal lift or heave.  The abdomen is soft and nontender.  The bowel sounds are normal.  The liver and spleen are not enlarged.  There are no abdominal masses.  There are no abdominal bruits.  Extremities reveal good pedal pulses.  There is no phlebitis and there is 2+ ankle edema.  There is no cyanosis or clubbing.  Strength is normal and symmetrical in all extremities.  There is no lateralizing weakness.  There are no sensory deficits.  The skin is warm and dry.  There is no rash.     Assessment / Plan: Continue same medication.  She will double up on her furosemide today and tomorrow to get rid of the extra peripheral edema. Recheck in 4 months for followup office visit and EKG.

## 2012-06-15 NOTE — Patient Instructions (Addendum)
Your physician recommends that you continue on your current medications as directed. Please refer to the Current Medication list given to you today.  Your physician recommends that you schedule a follow-up appointment in: 4 months  

## 2012-06-15 NOTE — Assessment & Plan Note (Signed)
Her left leg pain which had previously been ascribed to her sciatica has disappeared now that she had her left hip replaced.

## 2012-07-01 DIAGNOSIS — H6120 Impacted cerumen, unspecified ear: Secondary | ICD-10-CM | POA: Diagnosis not present

## 2012-07-01 DIAGNOSIS — H9209 Otalgia, unspecified ear: Secondary | ICD-10-CM | POA: Diagnosis not present

## 2012-07-07 DIAGNOSIS — N3946 Mixed incontinence: Secondary | ICD-10-CM | POA: Diagnosis not present

## 2012-07-24 DIAGNOSIS — E11359 Type 2 diabetes mellitus with proliferative diabetic retinopathy without macular edema: Secondary | ICD-10-CM | POA: Diagnosis not present

## 2012-07-24 DIAGNOSIS — E119 Type 2 diabetes mellitus without complications: Secondary | ICD-10-CM | POA: Diagnosis not present

## 2012-07-24 DIAGNOSIS — D3130 Benign neoplasm of unspecified choroid: Secondary | ICD-10-CM | POA: Diagnosis not present

## 2012-07-24 DIAGNOSIS — E1139 Type 2 diabetes mellitus with other diabetic ophthalmic complication: Secondary | ICD-10-CM | POA: Diagnosis not present

## 2012-07-30 DIAGNOSIS — E559 Vitamin D deficiency, unspecified: Secondary | ICD-10-CM | POA: Diagnosis not present

## 2012-07-30 DIAGNOSIS — N3090 Cystitis, unspecified without hematuria: Secondary | ICD-10-CM | POA: Diagnosis not present

## 2012-07-30 DIAGNOSIS — D631 Anemia in chronic kidney disease: Secondary | ICD-10-CM | POA: Diagnosis not present

## 2012-07-30 DIAGNOSIS — E782 Mixed hyperlipidemia: Secondary | ICD-10-CM | POA: Diagnosis not present

## 2012-07-30 DIAGNOSIS — E1149 Type 2 diabetes mellitus with other diabetic neurological complication: Secondary | ICD-10-CM | POA: Diagnosis not present

## 2012-07-30 DIAGNOSIS — N039 Chronic nephritic syndrome with unspecified morphologic changes: Secondary | ICD-10-CM | POA: Diagnosis not present

## 2012-08-04 DIAGNOSIS — E782 Mixed hyperlipidemia: Secondary | ICD-10-CM | POA: Diagnosis not present

## 2012-08-04 DIAGNOSIS — E11339 Type 2 diabetes mellitus with moderate nonproliferative diabetic retinopathy without macular edema: Secondary | ICD-10-CM | POA: Diagnosis not present

## 2012-08-04 DIAGNOSIS — Z23 Encounter for immunization: Secondary | ICD-10-CM | POA: Diagnosis not present

## 2012-08-04 DIAGNOSIS — E1139 Type 2 diabetes mellitus with other diabetic ophthalmic complication: Secondary | ICD-10-CM | POA: Diagnosis not present

## 2012-08-04 DIAGNOSIS — E1142 Type 2 diabetes mellitus with diabetic polyneuropathy: Secondary | ICD-10-CM | POA: Diagnosis not present

## 2012-08-04 DIAGNOSIS — N039 Chronic nephritic syndrome with unspecified morphologic changes: Secondary | ICD-10-CM | POA: Diagnosis not present

## 2012-08-04 DIAGNOSIS — N3090 Cystitis, unspecified without hematuria: Secondary | ICD-10-CM | POA: Diagnosis not present

## 2012-08-04 DIAGNOSIS — E1149 Type 2 diabetes mellitus with other diabetic neurological complication: Secondary | ICD-10-CM | POA: Diagnosis not present

## 2012-08-04 DIAGNOSIS — D631 Anemia in chronic kidney disease: Secondary | ICD-10-CM | POA: Diagnosis not present

## 2012-08-04 DIAGNOSIS — N182 Chronic kidney disease, stage 2 (mild): Secondary | ICD-10-CM | POA: Diagnosis not present

## 2012-08-13 DIAGNOSIS — M169 Osteoarthritis of hip, unspecified: Secondary | ICD-10-CM | POA: Diagnosis not present

## 2012-10-20 ENCOUNTER — Ambulatory Visit (INDEPENDENT_AMBULATORY_CARE_PROVIDER_SITE_OTHER): Payer: Medicare Other | Admitting: Cardiology

## 2012-10-20 ENCOUNTER — Encounter: Payer: Self-pay | Admitting: Cardiology

## 2012-10-20 VITALS — BP 136/67 | HR 57 | Ht 64.0 in | Wt 211.0 lb

## 2012-10-20 DIAGNOSIS — R609 Edema, unspecified: Secondary | ICD-10-CM

## 2012-10-20 DIAGNOSIS — R600 Localized edema: Secondary | ICD-10-CM | POA: Insufficient documentation

## 2012-10-20 DIAGNOSIS — I5032 Chronic diastolic (congestive) heart failure: Secondary | ICD-10-CM

## 2012-10-20 DIAGNOSIS — I509 Heart failure, unspecified: Secondary | ICD-10-CM | POA: Diagnosis not present

## 2012-10-20 DIAGNOSIS — I2510 Atherosclerotic heart disease of native coronary artery without angina pectoris: Secondary | ICD-10-CM | POA: Diagnosis not present

## 2012-10-20 DIAGNOSIS — I10 Essential (primary) hypertension: Secondary | ICD-10-CM

## 2012-10-20 DIAGNOSIS — I25810 Atherosclerosis of coronary artery bypass graft(s) without angina pectoris: Secondary | ICD-10-CM

## 2012-10-20 NOTE — Progress Notes (Signed)
[name] Date of Birth:  1931/07/02 [facility] [address] [place], [place]  [address] [phone]         Fax   [phone]  History of Present Illness: This pleasant 78 year old woman is seen for a scheduled four-month followup office visit. She has a past history of diastolic congestive heart failure. Her last echocardiogram on 02/10/12 showed an ejection fraction of 60% with normal systolic function and grade 1 diastolic dysfunction. She has a past history of pericardial effusion with pericardial tamponade and in 2010 and underwen a pericardial window with good results.   she has a history of mild aortic stenosis. Since we last saw her she underwent left hip replacement by Dr. Turner Daniels and has done well. Her last echocardiogram was 02/10/12 and showed an ejection fraction of 60%   Current Outpatient Prescriptions  Medication Sig Dispense Refill  . amLODipine (NORVASC) 10 MG tablet Take 5 mg by mouth daily.       Marland Kitchen aspirin 325 MG tablet Take 81 mg by mouth daily.      Marland Kitchen atorvastatin (LIPITOR) 80 MG tablet Take 40 mg by mouth every evening.      . ergocalciferol (VITAMIN D2) 50000 UNITS capsule Take 50,000 Units by mouth once a week. On friday      . furosemide (LASIX) 80 MG tablet Take 80 mg by mouth every morning.      Marland Kitchen GLIPIZIDE XL 5 MG 24 hr tablet Take 5 mg by mouth Daily.      . insulin aspart (NOVOLOG) 100 UNIT/ML injection Inject 3-5 Units into the skin 3 (three) times daily before meals. Sliding scale      . insulin glargine (LANTUS) 100 UNIT/ML injection Inject 22 Units into the skin at bedtime.       . IRON PO Take 1 tablet by mouth daily.       . metoprolol tartrate (LOPRESSOR) 25 MG tablet Take 25 mg by mouth 2 (two) times daily.      . pioglitazone (ACTOS) 15 MG tablet Take 15 mg by mouth daily.      . traMADol (ULTRAM) 50 MG tablet as directed.      . valsartan (DIOVAN) 320 MG tablet Take 320 mg by mouth daily.         Allergies  Allergen  Reactions  . Codeine Nausea And Vomiting    Hydrocodone is ok  . Gabapentin     neuropathy  . Lactose Intolerance (Gi) Diarrhea  . Lodine (Etodolac) Other (See Comments)    dizziness  . Percocet (Oxycodone-Acetaminophen) Other (See Comments)    Percocet does not relieve pts' pain.   [name] Cross Reactors Nausea Only    Patient Active Problem List  Diagnosis  . Coronary artery disease  . Hypertension  . Dyspnea  . Anasarca  . Chronic diastolic congestive heart failure  . Dyslipidemia  . Diabetes mellitus  . Sciatica neuralgia  . Pedal edema    History  Smoking status  . Never Smoker   Smokeless tobacco  . Not on file    History  Alcohol Use No    Family History  Problem Relation Age of Onset  . Heart attack Father   . Hypertension Father   . Stroke Mother   . Angina Mother   . Coronary artery disease      Review of Systems: Constitutional: no fever chills diaphoresis or fatigue or change in weight.  Head and neck: no hearing loss,  no epistaxis, no photophobia or visual disturbance. Respiratory: No cough, shortness of breath or wheezing. Cardiovascular: No chest pain peripheral edema, palpitations. Gastrointestinal: No abdominal distention, no abdominal pain, no change in bowel habits hematochezia or melena. Genitourinary: No dysuria, no frequency, no urgency, no nocturia. Musculoskeletal:No arthralgias, no back pain, no gait disturbance or myalgias. Neurological: No dizziness, no headaches, no numbness, no seizures, no syncope, no weakness, no tremors. Hematologic: No lymphadenopathy, no easy bruising. Psychiatric: No confusion, no hallucinations, no sleep disturbance.    Physical Exam: Filed Vitals:   10/20/12 1012  BP: 136/67  Pulse: 57   the general appearance reveals a well-developed well-nourished elderly woman in no distress.  She is moderately overweight her weight is up 5 pounds since last visit.The head and neck exam reveals pupils equal  and reactive.  Extraocular movements are full.  There is no scleral icterus.  The mouth and pharynx are normal.  The neck is supple.  The carotids reveal no bruits.  The jugular venous pressure is normal.  The  thyroid is not enlarged.  There is no lymphadenopathy.  The chest is clear to percussion and auscultation.  There are no rales or rhonchi.  Expansion of the chest is symmetrical.  The precordium is quiet.  The first heart sound is normal.  The second heart sound is physiologically split.  There is no murmur gallop rub or click.  There is no abnormal lift or heave.  The abdomen is soft and nontender.  The bowel sounds are normal.  The liver and spleen are not enlarged.  There are no abdominal masses.  There are no abdominal bruits.  Extremities reveal good pedal pulses.  There is 1+ per full edema. There is no cyanosis or clubbing.  Strength is normal and symmetrical in all extremities.  There is no lateralizing weakness.  There are no sensory deficits.  The skin is warm and dry.  There is no rash.  EKG shows anus bradycardia and no ischemic changes   Assessment / Plan: Continue on same medication.  Recheck in 4 months for followup office visit.  For blood work and lipids are followed by her endocrinologist.  Work harder on weight loss and portion control

## 2012-10-20 NOTE — Assessment & Plan Note (Signed)
The patient has not been experiencing any cardiac symptoms.  He denies any increasing shortness of breath and she has not been having any chest pains.  She continues to have problem with weight gain or weight is up 5 pounds which she attributes to overeating at a family reunion recently

## 2012-10-20 NOTE — Patient Instructions (Addendum)
Your physician recommends that you continue on your current medications as directed. Please refer to the Current Medication list given to you today.  Your physician wants you to follow-up in: 4 MONTHS.  You will receive a reminder letter in the mail two months in advance. If you don't receive a letter, please call our office to schedule the follow-up appointment.  

## 2012-10-20 NOTE — Assessment & Plan Note (Signed)
Blood pressure has been stable on current therapy.  No headaches or dizziness.  No palpitations

## 2012-10-20 NOTE — Assessment & Plan Note (Signed)
The patient has not been expressing any angina pectoris. 

## 2012-10-22 DIAGNOSIS — R82998 Other abnormal findings in urine: Secondary | ICD-10-CM | POA: Diagnosis not present

## 2012-10-22 DIAGNOSIS — R300 Dysuria: Secondary | ICD-10-CM | POA: Diagnosis not present

## 2012-11-05 DIAGNOSIS — E782 Mixed hyperlipidemia: Secondary | ICD-10-CM | POA: Diagnosis not present

## 2012-11-05 DIAGNOSIS — E1149 Type 2 diabetes mellitus with other diabetic neurological complication: Secondary | ICD-10-CM | POA: Diagnosis not present

## 2012-11-05 DIAGNOSIS — D631 Anemia in chronic kidney disease: Secondary | ICD-10-CM | POA: Diagnosis not present

## 2012-11-05 DIAGNOSIS — E559 Vitamin D deficiency, unspecified: Secondary | ICD-10-CM | POA: Diagnosis not present

## 2012-11-10 DIAGNOSIS — E1139 Type 2 diabetes mellitus with other diabetic ophthalmic complication: Secondary | ICD-10-CM | POA: Diagnosis not present

## 2012-11-10 DIAGNOSIS — N3090 Cystitis, unspecified without hematuria: Secondary | ICD-10-CM | POA: Diagnosis not present

## 2012-11-10 DIAGNOSIS — E11339 Type 2 diabetes mellitus with moderate nonproliferative diabetic retinopathy without macular edema: Secondary | ICD-10-CM | POA: Diagnosis not present

## 2012-11-10 DIAGNOSIS — N182 Chronic kidney disease, stage 2 (mild): Secondary | ICD-10-CM | POA: Diagnosis not present

## 2012-11-10 DIAGNOSIS — I10 Essential (primary) hypertension: Secondary | ICD-10-CM | POA: Diagnosis not present

## 2012-11-10 DIAGNOSIS — E1142 Type 2 diabetes mellitus with diabetic polyneuropathy: Secondary | ICD-10-CM | POA: Diagnosis not present

## 2012-11-10 DIAGNOSIS — E1149 Type 2 diabetes mellitus with other diabetic neurological complication: Secondary | ICD-10-CM | POA: Diagnosis not present

## 2012-11-10 DIAGNOSIS — E782 Mixed hyperlipidemia: Secondary | ICD-10-CM | POA: Diagnosis not present

## 2012-11-17 DIAGNOSIS — R82998 Other abnormal findings in urine: Secondary | ICD-10-CM | POA: Diagnosis not present

## 2013-01-14 DIAGNOSIS — Z1231 Encounter for screening mammogram for malignant neoplasm of breast: Secondary | ICD-10-CM | POA: Diagnosis not present

## 2013-01-26 DIAGNOSIS — D3130 Benign neoplasm of unspecified choroid: Secondary | ICD-10-CM | POA: Diagnosis not present

## 2013-01-26 DIAGNOSIS — E1139 Type 2 diabetes mellitus with other diabetic ophthalmic complication: Secondary | ICD-10-CM | POA: Diagnosis not present

## 2013-01-26 DIAGNOSIS — E119 Type 2 diabetes mellitus without complications: Secondary | ICD-10-CM | POA: Diagnosis not present

## 2013-01-26 DIAGNOSIS — E11359 Type 2 diabetes mellitus with proliferative diabetic retinopathy without macular edema: Secondary | ICD-10-CM | POA: Diagnosis not present

## 2013-02-11 DIAGNOSIS — E1149 Type 2 diabetes mellitus with other diabetic neurological complication: Secondary | ICD-10-CM | POA: Diagnosis not present

## 2013-02-11 DIAGNOSIS — N039 Chronic nephritic syndrome with unspecified morphologic changes: Secondary | ICD-10-CM | POA: Diagnosis not present

## 2013-02-11 DIAGNOSIS — E559 Vitamin D deficiency, unspecified: Secondary | ICD-10-CM | POA: Diagnosis not present

## 2013-02-11 DIAGNOSIS — E782 Mixed hyperlipidemia: Secondary | ICD-10-CM | POA: Diagnosis not present

## 2013-02-11 DIAGNOSIS — D631 Anemia in chronic kidney disease: Secondary | ICD-10-CM | POA: Diagnosis not present

## 2013-02-17 ENCOUNTER — Ambulatory Visit: Payer: Medicare Other | Admitting: Cardiology

## 2013-02-23 DIAGNOSIS — E782 Mixed hyperlipidemia: Secondary | ICD-10-CM | POA: Diagnosis not present

## 2013-02-23 DIAGNOSIS — E559 Vitamin D deficiency, unspecified: Secondary | ICD-10-CM | POA: Diagnosis not present

## 2013-02-23 DIAGNOSIS — E1149 Type 2 diabetes mellitus with other diabetic neurological complication: Secondary | ICD-10-CM | POA: Diagnosis not present

## 2013-02-23 DIAGNOSIS — I10 Essential (primary) hypertension: Secondary | ICD-10-CM | POA: Diagnosis not present

## 2013-02-23 DIAGNOSIS — E1142 Type 2 diabetes mellitus with diabetic polyneuropathy: Secondary | ICD-10-CM | POA: Diagnosis not present

## 2013-02-23 DIAGNOSIS — N3090 Cystitis, unspecified without hematuria: Secondary | ICD-10-CM | POA: Diagnosis not present

## 2013-03-05 ENCOUNTER — Ambulatory Visit (INDEPENDENT_AMBULATORY_CARE_PROVIDER_SITE_OTHER): Payer: Medicare Other | Admitting: Cardiology

## 2013-03-05 ENCOUNTER — Encounter: Payer: Self-pay | Admitting: Cardiology

## 2013-03-05 VITALS — BP 122/70 | HR 65 | Ht 64.0 in | Wt 212.4 lb

## 2013-03-05 DIAGNOSIS — I5032 Chronic diastolic (congestive) heart failure: Secondary | ICD-10-CM

## 2013-03-05 DIAGNOSIS — I2510 Atherosclerotic heart disease of native coronary artery without angina pectoris: Secondary | ICD-10-CM | POA: Diagnosis not present

## 2013-03-05 DIAGNOSIS — I10 Essential (primary) hypertension: Secondary | ICD-10-CM

## 2013-03-05 DIAGNOSIS — I119 Hypertensive heart disease without heart failure: Secondary | ICD-10-CM

## 2013-03-05 DIAGNOSIS — I509 Heart failure, unspecified: Secondary | ICD-10-CM

## 2013-03-05 NOTE — Assessment & Plan Note (Signed)
Blood pressure has been 1 current therapy.

## 2013-03-05 NOTE — Assessment & Plan Note (Signed)
The patient has mild peripheral edema which goes down overnight and was re\re accumulates as she is on her feet next day

## 2013-03-05 NOTE — Progress Notes (Signed)
[name] Date of Birth:  10-03-1931 [facility] [address] [place], [place]  [address] [phone]         Fax   [phone]  History of Present Illness: This pleasant 79 year old woman is seen for a scheduled four-month followup office visit. She has a past history of diastolic congestive heart failure. Her last echocardiogram on 02/10/12 showed an ejection fraction of 60% with normal systolic function and grade 1 diastolic dysfunction. She has a past history of pericardial effusion with pericardial tamponade and in 2010 and underwen a pericardial window with good results. she has a history of mild aortic stenosis. Since we last saw her she underwent left hip replacement by Dr. Turner Daniels and has done well.  Her last echocardiogram was 02/10/12 and showed an ejection fraction of 60%   Current Outpatient Prescriptions  Medication Sig Dispense Refill  . amLODipine (NORVASC) 10 MG tablet Take 5 mg by mouth daily.       Marland Kitchen aspirin 325 MG tablet Take 81 mg by mouth daily.      Marland Kitchen atorvastatin (LIPITOR) 80 MG tablet Take 40 mg by mouth every evening.      . ergocalciferol (VITAMIN D2) 50000 UNITS capsule Take 50,000 Units by mouth once a week. On friday      . furosemide (LASIX) 80 MG tablet Take 80 mg by mouth every morning.      Marland Kitchen GLIPIZIDE XL 5 MG 24 hr tablet Take 5 mg by mouth Daily.      . insulin aspart (NOVOLOG) 100 UNIT/ML injection Inject 3-5 Units into the skin 3 (three) times daily before meals. Sliding scale      . insulin glargine (LANTUS) 100 UNIT/ML injection Inject 22 Units into the skin at bedtime.       . IRON PO Take 1 tablet by mouth daily.       . metoprolol tartrate (LOPRESSOR) 25 MG tablet Take 25 mg by mouth 2 (two) times daily.      . pioglitazone (ACTOS) 15 MG tablet Take 15 mg by mouth daily.      . traMADol (ULTRAM) 50 MG tablet Take 50 mg by mouth as needed.       . valsartan (DIOVAN) 320 MG tablet Take 320 mg by mouth daily.        No  current facility-administered medications for this visit.    Allergies  Allergen Reactions  . Codeine Nausea And Vomiting    Hydrocodone is ok  . Gabapentin     neuropathy  . Lactose Intolerance (Gi) Diarrhea  . Lodine (Etodolac) Other (See Comments)    dizziness  . Percocet (Oxycodone-Acetaminophen) Other (See Comments)    Percocet does not relieve pts' pain.   [name] Drugs Cross Reactors Nausea Only    Patient Active Problem List   Diagnosis Date Noted  . Chronic diastolic congestive heart failure 03/18/2011    Priority: High  . Sciatica neuralgia 03/18/2011    Priority: High  . Pedal edema 10/20/2012  . Dyslipidemia 03/18/2011  . Diabetes mellitus 03/18/2011  . Coronary artery disease   . Hypertension   . Dyspnea   . Anasarca 04/04/2009    History  Smoking status  . Never Smoker   Smokeless tobacco  . Not on file    History  Alcohol Use No    Family History  Problem Relation Age of Onset  . Heart attack Father   . Hypertension Father   . Stroke Mother   .  Angina Mother   . Coronary artery disease      Review of Systems: Constitutional: no fever chills diaphoresis or fatigue or change in weight.  Head and neck: no hearing loss, no epistaxis, no photophobia or visual disturbance. Respiratory: No cough, shortness of breath or wheezing. Cardiovascular: No chest pain peripheral edema, palpitations. Gastrointestinal: No abdominal distention, no abdominal pain, no change in bowel habits hematochezia or melena. Genitourinary: No dysuria, no frequency, no urgency, no nocturia. Musculoskeletal:No arthralgias, no back pain, no gait disturbance or myalgias. Neurological: No dizziness, no headaches, no numbness, no seizures, no syncope, no weakness, no tremors. Hematologic: No lymphadenopathy, no easy bruising. Psychiatric: No confusion, no hallucinations, no sleep disturbance.    Physical Exam: Filed Vitals:   03/05/13 1358  BP: 122/70  Pulse: 65   the  general appearance reveals a well-developed moderately obese woman in no distress.The head and neck exam reveals pupils equal and reactive.  Extraocular movements are full.  There is no scleral icterus.  The mouth and pharynx are normal.  The neck is supple.  The carotids reveal no bruits.  The jugular venous pressure is normal.  The  thyroid is not enlarged.  There is no lymphadenopathy.  The chest is clear to percussion and auscultation.  There are no rales or rhonchi.  Expansion of the chest is symmetrical.  The precordium is quiet.  The first heart sound is normal.  The second heart sound is physiologically split.  There is no murmur gallop rub or click.  There is no abnormal lift or heave.  The abdomen is soft and nontender.  The bowel sounds are normal.  The liver and spleen are not enlarged.  There are no abdominal masses.  There are no abdominal bruits.  Extremities reveal good pedal pulses.  There is 1+ ankle edema bilaterally.  There is no cyanosis or clubbing.  Strength is normal and symmetrical in all extremities.  There is no lateralizing weakness.  There are no sensory deficits.  The skin is warm and dry.  There is no rash.    Assessment / Plan: Continue same medication.  She had a chest x-ray on 04/29/11 which was negative.  She will be rechecked here in 4 months for followup office visit.  Her PCP does her blood work

## 2013-03-05 NOTE — Patient Instructions (Addendum)
Your physician recommends that you continue on your current medications as directed. Please refer to the Current Medication list given to you today.  Your physician recommends that you schedule a follow-up appointment in: 4 month ov  

## 2013-03-05 NOTE — Assessment & Plan Note (Signed)
The patient has not been having any chest pain or angina pectoris 

## 2013-05-13 ENCOUNTER — Other Ambulatory Visit: Payer: Self-pay

## 2013-05-13 MED ORDER — FUROSEMIDE 80 MG PO TABS: 80.0000 mg | ORAL_TABLET | ORAL | Status: DC

## 2013-05-19 DIAGNOSIS — R82998 Other abnormal findings in urine: Secondary | ICD-10-CM | POA: Diagnosis not present

## 2013-06-22 ENCOUNTER — Other Ambulatory Visit: Payer: Self-pay | Admitting: Physician Assistant

## 2013-06-22 DIAGNOSIS — D239 Other benign neoplasm of skin, unspecified: Secondary | ICD-10-CM | POA: Diagnosis not present

## 2013-06-22 DIAGNOSIS — D485 Neoplasm of uncertain behavior of skin: Secondary | ICD-10-CM | POA: Diagnosis not present

## 2013-06-22 DIAGNOSIS — L820 Inflamed seborrheic keratosis: Secondary | ICD-10-CM | POA: Diagnosis not present

## 2013-06-22 DIAGNOSIS — L821 Other seborrheic keratosis: Secondary | ICD-10-CM | POA: Diagnosis not present

## 2013-07-07 ENCOUNTER — Ambulatory Visit (INDEPENDENT_AMBULATORY_CARE_PROVIDER_SITE_OTHER): Payer: Medicare Other | Admitting: Cardiology

## 2013-07-07 ENCOUNTER — Encounter: Payer: Self-pay | Admitting: Cardiology

## 2013-07-07 VITALS — BP 134/68 | HR 64 | Ht 64.0 in | Wt 215.6 lb

## 2013-07-07 DIAGNOSIS — I119 Hypertensive heart disease without heart failure: Secondary | ICD-10-CM | POA: Diagnosis not present

## 2013-07-07 DIAGNOSIS — I509 Heart failure, unspecified: Secondary | ICD-10-CM | POA: Diagnosis not present

## 2013-07-07 DIAGNOSIS — I5032 Chronic diastolic (congestive) heart failure: Secondary | ICD-10-CM

## 2013-07-07 DIAGNOSIS — I10 Essential (primary) hypertension: Secondary | ICD-10-CM

## 2013-07-07 MED ORDER — FUROSEMIDE 80 MG PO TABS: ORAL_TABLET | ORAL | Status: DC

## 2013-07-07 NOTE — Assessment & Plan Note (Signed)
The patient has not been having any hypoglycemic episodes.  She states that her A1c is using the range of 6.2.  Her weight today is up 3 pounds since last visit.

## 2013-07-07 NOTE — Assessment & Plan Note (Signed)
Blood pressure was remaining stable on current therapy.  No headaches dizziness or syncope. 

## 2013-07-07 NOTE — Progress Notes (Signed)
[name] Date of Birth:  03-26-1931 [facility] [address] [place], [place]  [address] [phone]         Fax   [phone]  History of Present Illness: This pleasant 79 year old woman is seen for a scheduled four-month followup office visit. She has a past history of diastolic congestive heart failure. Her last echocardiogram on 02/10/12 showed an ejection fraction of 60% with normal systolic function and grade 1 diastolic dysfunction. She has a past history of pericardial effusion with pericardial tamponade and in 2010 and underwen a pericardial window with good results. she has a history of mild aortic stenosis. Since we last saw her she underwent left hip replacement by Dr. Turner Daniels and has done well.  Her last echocardiogram was 02/10/12 and showed an ejection fraction of 60%.  Since last visit she has been feeling well.  She is looking forward to her 82th birthday later this week and she and her husband will be celebrating their 64 wedding anniversary this year.   Current Outpatient Prescriptions  Medication Sig Dispense Refill  . amLODipine (NORVASC) 10 MG tablet Take 5 mg by mouth daily.       Marland Kitchen atorvastatin (LIPITOR) 80 MG tablet Take 40 mg by mouth every evening.      . ergocalciferol (VITAMIN D2) 50000 UNITS capsule Take 50,000 Units by mouth once a week. On friday      . furosemide (LASIX) 80 MG tablet 1 and 1/2 tablet daily  145 tablet  3  . GLIPIZIDE XL 5 MG 24 hr tablet Take 5 mg by mouth Daily.      . insulin aspart (NOVOLOG) 100 UNIT/ML injection Inject 3-5 Units into the skin 3 (three) times daily before meals. Sliding scale      . insulin glargine (LANTUS) 100 UNIT/ML injection Inject 22 Units into the skin at bedtime.       . IRON PO Take 1 tablet by mouth daily.       . metoprolol tartrate (LOPRESSOR) 25 MG tablet Take 25 mg by mouth 2 (two) times daily.      . pioglitazone (ACTOS) 15 MG tablet Take 15 mg by mouth daily.      . valsartan  (DIOVAN) 320 MG tablet Take 320 mg by mouth daily.        No current facility-administered medications for this visit.    Allergies  Allergen Reactions  . Codeine Nausea And Vomiting    Hydrocodone is ok  . Gabapentin     neuropathy  . Lactose Intolerance (Gi) Diarrhea  . Lodine [Etodolac] Other (See Comments)    dizziness  . Percocet [Oxycodone-Acetaminophen] Other (See Comments)    Percocet does not relieve pts' pain.   [name] Drugs Cross Reactors Nausea Only    Patient Active Problem List   Diagnosis Date Noted  . Chronic diastolic congestive heart failure 03/18/2011    Priority: High  . Sciatica neuralgia 03/18/2011    Priority: High  . Pedal edema 10/20/2012  . Dyslipidemia 03/18/2011  . Diabetes mellitus 03/18/2011  . Coronary artery disease   . Hypertension   . Dyspnea   . Anasarca 04/04/2009    History  Smoking status  . Never Smoker   Smokeless tobacco  . Not on file    History  Alcohol Use No    Family History  Problem Relation Age of Onset  . Heart attack Father   . Hypertension Father   . Stroke Mother   .  Angina Mother   . Coronary artery disease      Review of Systems: Constitutional: no fever chills diaphoresis or fatigue or change in weight.  Head and neck: no hearing loss, no epistaxis, no photophobia or visual disturbance. Respiratory: No cough, shortness of breath or wheezing. Cardiovascular: No chest pain peripheral edema, palpitations. Gastrointestinal: No abdominal distention, no abdominal pain, no change in bowel habits hematochezia or melena. Genitourinary: No dysuria, no frequency, no urgency, no nocturia. Musculoskeletal:No arthralgias, no back pain, no gait disturbance or myalgias. Neurological: No dizziness, no headaches, no numbness, no seizures, no syncope, no weakness, no tremors. Hematologic: No lymphadenopathy, no easy bruising. Psychiatric: No confusion, no hallucinations, no sleep disturbance.    Physical  Exam: Filed Vitals:   07/07/13 1412  BP: 134/68  Pulse: 64   the general appearance reveals a somewhat heavyset woman in no distress.The head and neck exam reveals pupils equal and reactive.  Extraocular movements are full.  There is no scleral icterus.  The mouth and pharynx are normal.  The neck is supple.  The carotids reveal no bruits.  The jugular venous pressure is normal.  The  thyroid is not enlarged.  There is no lymphadenopathy.  The chest is clear to percussion and auscultation.  There are no rales or rhonchi.  Expansion of the chest is symmetrical.  The precordium is quiet.  The first heart sound is normal.  The second heart sound is physiologically split.  There is no murmur gallop rub or click.  There is no abnormal lift or heave.  The abdomen is soft and nontender.  The bowel sounds are normal.  The liver and spleen are not enlarged.  There are no abdominal masses.  There are no abdominal bruits.  Extremities reveal good pedal pulses.  There is no phlebitis or edema.  There is no cyanosis or clubbing.  Strength is normal and symmetrical in all extremities.  There is no lateralizing weakness.  There are no sensory deficits.  The skin is warm and dry.  There is no rash.  EKG shows normal sinus and left anterior hemiblock is unchanged since 10/20/12  Assessment / Plan: Continue same medication.  Increase Lasix to 1-1/2 tablets daily but she may elect to take just one daily if she is not having any unusual weight gain or fluid retention. Return in 4 months for followup office visit

## 2013-07-07 NOTE — Assessment & Plan Note (Signed)
The patient has a history of fluid retention.  She frequently has to take an extra half Lasix.  We will adjust her ongoing prescription to reflect the need for additional Lasix

## 2013-07-07 NOTE — Patient Instructions (Addendum)
INCREASE YOUR LASIX (FUROSEMIDE) TO 80 MG 1 AND 1/2 TABLET DAILY  Your physician wants you to follow-up in: 4 month ov You will receive a reminder letter in the mail two months in advance. If you don't receive a letter, please call our office to schedule the follow-up appointment.

## 2013-07-23 DIAGNOSIS — E782 Mixed hyperlipidemia: Secondary | ICD-10-CM | POA: Diagnosis not present

## 2013-07-23 DIAGNOSIS — D631 Anemia in chronic kidney disease: Secondary | ICD-10-CM | POA: Diagnosis not present

## 2013-07-23 DIAGNOSIS — E1139 Type 2 diabetes mellitus with other diabetic ophthalmic complication: Secondary | ICD-10-CM | POA: Diagnosis not present

## 2013-07-27 DIAGNOSIS — I10 Essential (primary) hypertension: Secondary | ICD-10-CM | POA: Diagnosis not present

## 2013-07-27 DIAGNOSIS — N182 Chronic kidney disease, stage 2 (mild): Secondary | ICD-10-CM | POA: Diagnosis not present

## 2013-07-27 DIAGNOSIS — E11339 Type 2 diabetes mellitus with moderate nonproliferative diabetic retinopathy without macular edema: Secondary | ICD-10-CM | POA: Diagnosis not present

## 2013-07-27 DIAGNOSIS — Z23 Encounter for immunization: Secondary | ICD-10-CM | POA: Diagnosis not present

## 2013-07-27 DIAGNOSIS — E1142 Type 2 diabetes mellitus with diabetic polyneuropathy: Secondary | ICD-10-CM | POA: Diagnosis not present

## 2013-07-27 DIAGNOSIS — E1149 Type 2 diabetes mellitus with other diabetic neurological complication: Secondary | ICD-10-CM | POA: Diagnosis not present

## 2013-07-27 DIAGNOSIS — D631 Anemia in chronic kidney disease: Secondary | ICD-10-CM | POA: Diagnosis not present

## 2013-07-27 DIAGNOSIS — E1139 Type 2 diabetes mellitus with other diabetic ophthalmic complication: Secondary | ICD-10-CM | POA: Diagnosis not present

## 2013-07-27 DIAGNOSIS — E782 Mixed hyperlipidemia: Secondary | ICD-10-CM | POA: Diagnosis not present

## 2013-09-28 DIAGNOSIS — E119 Type 2 diabetes mellitus without complications: Secondary | ICD-10-CM | POA: Diagnosis not present

## 2013-09-28 DIAGNOSIS — E11359 Type 2 diabetes mellitus with proliferative diabetic retinopathy without macular edema: Secondary | ICD-10-CM | POA: Diagnosis not present

## 2013-09-28 DIAGNOSIS — E1139 Type 2 diabetes mellitus with other diabetic ophthalmic complication: Secondary | ICD-10-CM | POA: Diagnosis not present

## 2013-11-02 DIAGNOSIS — E782 Mixed hyperlipidemia: Secondary | ICD-10-CM | POA: Diagnosis not present

## 2013-11-02 DIAGNOSIS — N182 Chronic kidney disease, stage 2 (mild): Secondary | ICD-10-CM | POA: Diagnosis not present

## 2013-11-02 DIAGNOSIS — D631 Anemia in chronic kidney disease: Secondary | ICD-10-CM | POA: Diagnosis not present

## 2013-11-02 DIAGNOSIS — E1139 Type 2 diabetes mellitus with other diabetic ophthalmic complication: Secondary | ICD-10-CM | POA: Diagnosis not present

## 2013-11-08 DIAGNOSIS — E1139 Type 2 diabetes mellitus with other diabetic ophthalmic complication: Secondary | ICD-10-CM | POA: Diagnosis not present

## 2013-11-08 DIAGNOSIS — I10 Essential (primary) hypertension: Secondary | ICD-10-CM | POA: Diagnosis not present

## 2013-11-08 DIAGNOSIS — E11339 Type 2 diabetes mellitus with moderate nonproliferative diabetic retinopathy without macular edema: Secondary | ICD-10-CM | POA: Diagnosis not present

## 2013-11-08 DIAGNOSIS — N182 Chronic kidney disease, stage 2 (mild): Secondary | ICD-10-CM | POA: Diagnosis not present

## 2013-11-08 DIAGNOSIS — E1149 Type 2 diabetes mellitus with other diabetic neurological complication: Secondary | ICD-10-CM | POA: Diagnosis not present

## 2013-11-08 DIAGNOSIS — D631 Anemia in chronic kidney disease: Secondary | ICD-10-CM | POA: Diagnosis not present

## 2013-11-08 DIAGNOSIS — E782 Mixed hyperlipidemia: Secondary | ICD-10-CM | POA: Diagnosis not present

## 2013-11-08 DIAGNOSIS — N039 Chronic nephritic syndrome with unspecified morphologic changes: Secondary | ICD-10-CM | POA: Diagnosis not present

## 2013-11-08 DIAGNOSIS — E1142 Type 2 diabetes mellitus with diabetic polyneuropathy: Secondary | ICD-10-CM | POA: Diagnosis not present

## 2013-12-07 ENCOUNTER — Encounter: Payer: Self-pay | Admitting: Cardiology

## 2013-12-07 ENCOUNTER — Ambulatory Visit (INDEPENDENT_AMBULATORY_CARE_PROVIDER_SITE_OTHER): Payer: Medicare Other | Admitting: Cardiology

## 2013-12-07 VITALS — BP 128/62 | HR 68 | Ht 64.0 in | Wt 217.8 lb

## 2013-12-07 DIAGNOSIS — M5430 Sciatica, unspecified side: Secondary | ICD-10-CM | POA: Diagnosis not present

## 2013-12-07 DIAGNOSIS — I5032 Chronic diastolic (congestive) heart failure: Secondary | ICD-10-CM

## 2013-12-07 DIAGNOSIS — I509 Heart failure, unspecified: Secondary | ICD-10-CM

## 2013-12-07 DIAGNOSIS — I10 Essential (primary) hypertension: Secondary | ICD-10-CM | POA: Diagnosis not present

## 2013-12-07 DIAGNOSIS — I119 Hypertensive heart disease without heart failure: Secondary | ICD-10-CM

## 2013-12-07 NOTE — Assessment & Plan Note (Signed)
Her sciatica pain has improved significantly since she had her left hip surgery in 2013 by Dr. Mayer Camel.  She does walk with a cane to help with her balance.  She has not been having any falls.

## 2013-12-07 NOTE — Progress Notes (Signed)
[name] Date of Birth:  September 24, 1931 [place], [place]  [address] [phone]         Fax   [phone]  History of Present Illness: This pleasant 80 year old woman is seen for a scheduled four-month followup office visit. She has a past history of diastolic congestive heart failure. Her last echocardiogram on 02/10/12 showed an ejection fraction of 60% with normal systolic function and grade 1 diastolic dysfunction. She has a past history of pericardial effusion with pericardial tamponade and in 2010 and underwen a pericardial window with good results. she has a history of mild aortic stenosis. Since we last saw her she underwent left hip replacement by Dr. Mayer Camel and has done well.  Her last echocardiogram was 02/10/12 and showed an ejection fraction of 60%.  Since last visit she has been feeling well.  Since last visit her husband has been diagnosed with early Alzheimer's but fortunately he seems to be responding nicely to Aricept.   Current Outpatient Prescriptions  Medication Sig Dispense Refill  . amLODipine (NORVASC) 10 MG tablet Take 5 mg by mouth daily.       Marland Kitchen atorvastatin (LIPITOR) 80 MG tablet Take 40 mg by mouth every evening.      . ergocalciferol (VITAMIN D2) 50000 UNITS capsule Take 50,000 Units by mouth once a week. On friday      . furosemide (LASIX) 80 MG tablet 1 and 1/2 tablet daily  145 tablet  3  . GLIPIZIDE XL 5 MG 24 hr tablet Take 5 mg by mouth Daily.      . insulin aspart (NOVOLOG) 100 UNIT/ML injection Inject 3-5 Units into the skin 3 (three) times daily before meals. Sliding scale      . insulin glargine (LANTUS) 100 UNIT/ML injection Inject 22 Units into the skin at bedtime.       . IRON PO Take 1 tablet by mouth daily.       . metoprolol tartrate (LOPRESSOR) 25 MG tablet Take 25 mg by mouth 2 (two) times daily.      . pioglitazone (ACTOS) 15 MG tablet Take 15 mg by mouth daily.      . valsartan (DIOVAN) 320 MG tablet Take 320 mg  by mouth daily.        No current facility-administered medications for this visit.    Allergies  Allergen Reactions  . Codeine Nausea And Vomiting    Hydrocodone is ok  . Gabapentin     neuropathy  . Lactose Intolerance (Gi) Diarrhea  . Lodine [Etodolac] Other (See Comments)    dizziness  . Percocet [Oxycodone-Acetaminophen] Other (See Comments)    Percocet does not relieve pts' pain.   [facility] Cross Reactors Nausea Only    Patient Active Problem List   Diagnosis Date Noted  . Chronic diastolic congestive heart failure 03/18/2011    Priority: High  . Sciatica neuralgia 03/18/2011    Priority: High  . Pedal edema 10/20/2012  . Dyslipidemia 03/18/2011  . Diabetes mellitus 03/18/2011  . Coronary artery disease   . Hypertension   . Dyspnea   . Anasarca 04/04/2009    History  Smoking status  . Never Smoker   Smokeless tobacco  . Not on file    History  Alcohol Use No    Family History  Problem Relation Age of Onset  . Heart attack Father   . Hypertension Father   . Stroke Mother   . Angina Mother   .  Coronary artery disease      Review of Systems: Constitutional: no fever chills diaphoresis or fatigue or change in weight.  Head and neck: no hearing loss, no epistaxis, no photophobia or visual disturbance. Respiratory: No cough, shortness of breath or wheezing. Cardiovascular: No chest pain peripheral edema, palpitations. Gastrointestinal: No abdominal distention, no abdominal pain, no change in bowel habits hematochezia or melena. Genitourinary: No dysuria, no frequency, no urgency, no nocturia. Musculoskeletal:No arthralgias, no back pain, no gait disturbance or myalgias. Neurological: No dizziness, no headaches, no numbness, no seizures, no syncope, no weakness, no tremors. Hematologic: No lymphadenopathy, no easy bruising. Psychiatric: No confusion, no hallucinations, no sleep disturbance.    Physical Exam: Filed Vitals:   12/07/13 1122  BP:  128/62  Pulse: 68   the general appearance reveals a somewhat heavyset woman in no distress.The head and neck exam reveals pupils equal and reactive.  Extraocular movements are full.  There is no scleral icterus.  The mouth and pharynx are normal.  The neck is supple.  The carotids reveal no bruits.  The jugular venous pressure is normal.  The  thyroid is not enlarged.  There is no lymphadenopathy.  The chest is clear to percussion and auscultation.  There are no rales or rhonchi.  Expansion of the chest is symmetrical.  The precordium is quiet.  The first heart sound is normal.  The second heart sound is physiologically split.  There is no murmur gallop rub or click.  There is no abnormal lift or heave.  The abdomen is soft and nontender.  The bowel sounds are normal.  The liver and spleen are not enlarged.  There are no abdominal masses.  There are no abdominal bruits.  Extremities reveal good pedal pulses.  There is no phlebitis or edema.  There is no cyanosis or clubbing.  Strength is normal and symmetrical in all extremities.  There is no lateralizing weakness.  There are no sensory deficits.  The skin is warm and dry.  There is no rash.    Assessment / Plan: Overall she is doing well.  She will continue same medication.  Work harder at weight loss.  Her weight is up 2 pounds since last visit.  Recheck at six-month intervals and we will get an EKG and office visit in 6 months.  Her blood work is done by her PCP

## 2013-12-07 NOTE — Assessment & Plan Note (Signed)
Patient has not been having any paroxysmal nocturnal dyspnea.  She sleeps on a wedge.  She has occasional mild intermittent ankle edema.

## 2013-12-07 NOTE — Assessment & Plan Note (Signed)
Her weight is up 2 pounds since last visit but her blood pressure is satisfactory.  She is not having any headaches or dizzy spells.  No symptoms of palpitations.

## 2013-12-07 NOTE — Patient Instructions (Signed)
Work on [employer] loss and diet  Your physician recommends that you continue on your current medications as directed. Please refer to the Current Medication list given to you today.  Your physician wants you to follow-up in: 6 month ov/ekg You will receive a reminder letter in the mail two months in advance. If you don't receive a letter, please call our office to schedule the follow-up appointment.

## 2014-02-08 DIAGNOSIS — E1139 Type 2 diabetes mellitus with other diabetic ophthalmic complication: Secondary | ICD-10-CM | POA: Diagnosis not present

## 2014-02-08 DIAGNOSIS — D631 Anemia in chronic kidney disease: Secondary | ICD-10-CM | POA: Diagnosis not present

## 2014-02-08 DIAGNOSIS — E782 Mixed hyperlipidemia: Secondary | ICD-10-CM | POA: Diagnosis not present

## 2014-02-14 DIAGNOSIS — E11339 Type 2 diabetes mellitus with moderate nonproliferative diabetic retinopathy without macular edema: Secondary | ICD-10-CM | POA: Diagnosis not present

## 2014-02-14 DIAGNOSIS — E1139 Type 2 diabetes mellitus with other diabetic ophthalmic complication: Secondary | ICD-10-CM | POA: Diagnosis not present

## 2014-02-14 DIAGNOSIS — E1142 Type 2 diabetes mellitus with diabetic polyneuropathy: Secondary | ICD-10-CM | POA: Diagnosis not present

## 2014-02-14 DIAGNOSIS — D631 Anemia in chronic kidney disease: Secondary | ICD-10-CM | POA: Diagnosis not present

## 2014-02-14 DIAGNOSIS — E1149 Type 2 diabetes mellitus with other diabetic neurological complication: Secondary | ICD-10-CM | POA: Diagnosis not present

## 2014-02-14 DIAGNOSIS — I10 Essential (primary) hypertension: Secondary | ICD-10-CM | POA: Diagnosis not present

## 2014-02-14 DIAGNOSIS — E782 Mixed hyperlipidemia: Secondary | ICD-10-CM | POA: Diagnosis not present

## 2014-02-14 DIAGNOSIS — N182 Chronic kidney disease, stage 2 (mild): Secondary | ICD-10-CM | POA: Diagnosis not present

## 2014-03-29 DIAGNOSIS — E11359 Type 2 diabetes mellitus with proliferative diabetic retinopathy without macular edema: Secondary | ICD-10-CM | POA: Diagnosis not present

## 2014-03-29 DIAGNOSIS — E119 Type 2 diabetes mellitus without complications: Secondary | ICD-10-CM | POA: Diagnosis not present

## 2014-03-29 DIAGNOSIS — E1139 Type 2 diabetes mellitus with other diabetic ophthalmic complication: Secondary | ICD-10-CM | POA: Diagnosis not present

## 2014-03-29 DIAGNOSIS — D3130 Benign neoplasm of unspecified choroid: Secondary | ICD-10-CM | POA: Diagnosis not present

## 2014-04-12 DIAGNOSIS — E11359 Type 2 diabetes mellitus with proliferative diabetic retinopathy without macular edema: Secondary | ICD-10-CM | POA: Diagnosis not present

## 2014-04-12 DIAGNOSIS — E11311 Type 2 diabetes mellitus with unspecified diabetic retinopathy with macular edema: Secondary | ICD-10-CM | POA: Diagnosis not present

## 2014-04-12 DIAGNOSIS — E1139 Type 2 diabetes mellitus with other diabetic ophthalmic complication: Secondary | ICD-10-CM | POA: Diagnosis not present

## 2014-04-12 DIAGNOSIS — D3130 Benign neoplasm of unspecified choroid: Secondary | ICD-10-CM | POA: Diagnosis not present

## 2014-05-12 DIAGNOSIS — D631 Anemia in chronic kidney disease: Secondary | ICD-10-CM | POA: Diagnosis not present

## 2014-05-12 DIAGNOSIS — E1139 Type 2 diabetes mellitus with other diabetic ophthalmic complication: Secondary | ICD-10-CM | POA: Diagnosis not present

## 2014-05-12 DIAGNOSIS — E782 Mixed hyperlipidemia: Secondary | ICD-10-CM | POA: Diagnosis not present

## 2014-05-17 DIAGNOSIS — I10 Essential (primary) hypertension: Secondary | ICD-10-CM | POA: Diagnosis not present

## 2014-05-17 DIAGNOSIS — E1149 Type 2 diabetes mellitus with other diabetic neurological complication: Secondary | ICD-10-CM | POA: Diagnosis not present

## 2014-05-17 DIAGNOSIS — E782 Mixed hyperlipidemia: Secondary | ICD-10-CM | POA: Diagnosis not present

## 2014-05-17 DIAGNOSIS — D631 Anemia in chronic kidney disease: Secondary | ICD-10-CM | POA: Diagnosis not present

## 2014-05-17 DIAGNOSIS — E1142 Type 2 diabetes mellitus with diabetic polyneuropathy: Secondary | ICD-10-CM | POA: Diagnosis not present

## 2014-05-17 DIAGNOSIS — N182 Chronic kidney disease, stage 2 (mild): Secondary | ICD-10-CM | POA: Diagnosis not present

## 2014-05-17 DIAGNOSIS — E1139 Type 2 diabetes mellitus with other diabetic ophthalmic complication: Secondary | ICD-10-CM | POA: Diagnosis not present

## 2014-05-17 DIAGNOSIS — E11339 Type 2 diabetes mellitus with moderate nonproliferative diabetic retinopathy without macular edema: Secondary | ICD-10-CM | POA: Diagnosis not present

## 2014-06-07 ENCOUNTER — Ambulatory Visit (INDEPENDENT_AMBULATORY_CARE_PROVIDER_SITE_OTHER): Payer: Medicare Other | Admitting: Cardiology

## 2014-06-07 ENCOUNTER — Encounter: Payer: Self-pay | Admitting: Cardiology

## 2014-06-07 VITALS — BP 138/70 | HR 65 | Ht 64.0 in | Wt 222.0 lb

## 2014-06-07 DIAGNOSIS — I119 Hypertensive heart disease without heart failure: Secondary | ICD-10-CM

## 2014-06-07 DIAGNOSIS — E1149 Type 2 diabetes mellitus with other diabetic neurological complication: Secondary | ICD-10-CM | POA: Insufficient documentation

## 2014-06-07 DIAGNOSIS — R600 Localized edema: Secondary | ICD-10-CM

## 2014-06-07 DIAGNOSIS — Z794 Long term (current) use of insulin: Secondary | ICD-10-CM

## 2014-06-07 DIAGNOSIS — E1349 Other specified diabetes mellitus with other diabetic neurological complication: Secondary | ICD-10-CM

## 2014-06-07 DIAGNOSIS — R609 Edema, unspecified: Secondary | ICD-10-CM | POA: Diagnosis not present

## 2014-06-07 DIAGNOSIS — E1142 Type 2 diabetes mellitus with diabetic polyneuropathy: Secondary | ICD-10-CM | POA: Diagnosis not present

## 2014-06-07 DIAGNOSIS — I5032 Chronic diastolic (congestive) heart failure: Secondary | ICD-10-CM

## 2014-06-07 DIAGNOSIS — I509 Heart failure, unspecified: Secondary | ICD-10-CM

## 2014-06-07 DIAGNOSIS — E113393 Type 2 diabetes mellitus with moderate nonproliferative diabetic retinopathy without macular edema, bilateral: Secondary | ICD-10-CM | POA: Insufficient documentation

## 2014-06-07 DIAGNOSIS — E0840 Diabetes mellitus due to underlying condition with diabetic neuropathy, unspecified: Secondary | ICD-10-CM

## 2014-06-07 MED ORDER — GABAPENTIN 300 MG PO CAPS: 300.0000 mg | ORAL_CAPSULE | Freq: Every day | ORAL | Status: DC

## 2014-06-07 NOTE — Progress Notes (Signed)
[name] Date of Birth:  04-03-1931 [place] [address] [place], [place]  [address] [phone]        Fax   [phone]   History of Present Illness:  This pleasant 80 year old woman is seen for a scheduled four-month followup office visit. She has a past history of diastolic congestive heart failure. Her last echocardiogram on 02/10/12 showed an ejection fraction of 60% with normal systolic function and grade 1 diastolic dysfunction. She has a past history of pericardial effusion with pericardial tamponade and in 2010 and underwen a pericardial window with good results. she has a history of mild aortic stenosis. Since we last saw her she underwent left hip replacement by Dr. Mayer Camel and has done well.  Her last echocardiogram was 02/10/12 and showed an ejection fraction of 60%. Since last visit she has been feeling well. Since last visit her husband has been diagnosed with early Alzheimer's but fortunately he seems to be responding nicely to Aricept.  Since last visit she has been having a problem with worsening neuropathy symptoms in her legs.  It bothers her during waking hours as well as when she tries to rest at night.  Current Outpatient Prescriptions  Medication Sig Dispense Refill  . amLODipine (NORVASC) 10 MG tablet Take 5 mg by mouth daily.       Marland Kitchen atorvastatin (LIPITOR) 80 MG tablet Take 40 mg by mouth every evening.      . ergocalciferol (VITAMIN D2) 50000 UNITS capsule Take 50,000 Units by mouth once a week. On friday      . furosemide (LASIX) 80 MG tablet 1  daily      . GLIPIZIDE XL 5 MG 24 hr tablet Take 5 mg by mouth Daily.      . insulin aspart (NOVOLOG) 100 UNIT/ML injection Inject into the skin as directed. Sliding scale      . insulin glargine (LANTUS) 100 UNIT/ML injection Inject 20 Units into the skin at bedtime.       . IRON PO Take 1 tablet by mouth daily.       . metoprolol tartrate (LOPRESSOR) 25 MG tablet Take 25 mg by mouth 2 (two)  times daily.      . pioglitazone (ACTOS) 15 MG tablet Take 15 mg by mouth daily.      . valsartan (DIOVAN) 320 MG tablet Take 320 mg by mouth daily.       Marland Kitchen gabapentin (NEURONTIN) 300 MG capsule Take 1 capsule (300 mg total) by mouth at bedtime.  30 capsule  5   No current facility-administered medications for this visit.    Allergies  Allergen Reactions  . Codeine Nausea And Vomiting    Hydrocodone is ok  . Gabapentin     neuropathy  . Lactose Intolerance (Gi) Diarrhea  . Lodine [Etodolac] Other (See Comments)    dizziness  . Percocet [Oxycodone-Acetaminophen] Other (See Comments)    Percocet does not relieve pts' pain.   [facility] Cross Reactors Nausea Only    Patient Active Problem List   Diagnosis Date Noted  . Chronic diastolic congestive heart failure 03/18/2011    Priority: High  . Sciatica neuralgia 03/18/2011    Priority: High  . Diabetes mellitus with neurological manifestation 06/07/2014  . Pedal edema 10/20/2012  . Dyslipidemia 03/18/2011  . Diabetes mellitus 03/18/2011  . Coronary artery disease   . Hypertension   . Dyspnea   . Anasarca 04/04/2009    History  Smoking status  . Never Smoker   Smokeless tobacco  . Not on file    History  Alcohol Use No    Family History  Problem Relation Age of Onset  . Heart attack Father   . Hypertension Father   . Stroke Mother   . Angina Mother   . Coronary artery disease      Review of Systems: Constitutional: no fever chills diaphoresis or fatigue or change in weight.  Head and neck: no hearing loss, no epistaxis, no photophobia or visual disturbance. Respiratory: No cough, shortness of breath or wheezing. Cardiovascular: No chest pain peripheral edema, palpitations. Gastrointestinal: No abdominal distention, no abdominal pain, no change in bowel habits hematochezia or melena. Genitourinary: No dysuria, no frequency, no urgency, no nocturia. Musculoskeletal:No arthralgias, no back pain, no gait  disturbance or myalgias. Neurological: No dizziness, no headaches, no numbness, no seizures, no syncope, no weakness, no tremors. Hematologic: No lymphadenopathy, no easy bruising. Psychiatric: No confusion, no hallucinations, no sleep disturbance.    Physical Exam: Filed Vitals:   06/07/14 1517  BP: 138/70  Pulse: 65   the general appearance reveals a large elderly woman in no distress.The head and neck exam reveals pupils equal and reactive.  Extraocular movements are full.  There is no scleral icterus.  The mouth and pharynx are normal.  The neck is supple.  The carotids reveal no bruits.  The jugular venous pressure is normal.  The  thyroid is not enlarged.  There is no lymphadenopathy.  The chest is clear to percussion and auscultation.  There are no rales or rhonchi.  Expansion of the chest is symmetrical.  The precordium is quiet.  The first heart sound is normal.  The second heart sound is physiologically split.  There is no murmur gallop rub or click.  There is no abnormal lift or heave.  The abdomen is soft and nontender.  The bowel sounds are normal.  The liver and spleen are not enlarged.  There are no abdominal masses.  There are no abdominal bruits.  Extremities reveal good pedal pulses.  There is 2+ pretibial and pedal edema bilaterally. There is no cyanosis or clubbing.  Strength is normal and symmetrical in all extremities.  There is no lateralizing weakness.  There are no sensory deficits.  The skin is warm and dry.  There is no rash.     Assessment / Plan: 1.  Chronic diastolic heart failure 2. status post pericardial window for pericardial effusion with pericardial tamponade in 2010 3. diabetes mellitus with diabetic neuropathy 4. essential hypertension  Plan: Continue same medication.  Trial of gabapentin 300 mg at at bedtime to help with her symptoms of peripheral neuropathy.  She does not feel that she is allergic to gabapentin. Recheck in 6 months for office  visit

## 2014-06-07 NOTE — Assessment & Plan Note (Signed)
Currently she has been on at least 80 mg of Lasix a day and sometimes more.  Despite this, her degree of peripheral edema remains about the same.  She has not been experiencing any orthopnea or paroxysmal nocturnal dyspnea.  Her diabetologist would like her to be on a lower dose of Lasix.  She will try reducing the dose to just half of an 80 mg daily and see how she does.  If her edema worsens, she will go back to the original dose.

## 2014-06-07 NOTE — Assessment & Plan Note (Signed)
The patient has been experiencing signs and symptoms of peripheral neuropathy most likely secondary to her diabetes.  We will give her another trial of gabapentin.  In discussing with her the use of gabapentin, she feels that she does not have a true allergy to it.  Rather, it was ineffective when he was being used for her hip pain prior to left hip replacement.  She is willing to try it again.  She was unable to tolerate Lyrica in the past.

## 2014-06-07 NOTE — Patient Instructions (Signed)
START GABAPENTIN 300 MG AT BEDTIME, RX SENT TO [facility]  Your physician wants you to follow-up in: 6 month ov You will receive a reminder letter in the mail two months in advance. If you don't receive a letter, please call our office to schedule the follow-up appointment.

## 2014-06-07 NOTE — Assessment & Plan Note (Signed)
The patient has not been having any chest pain or angina

## 2014-06-07 NOTE — Assessment & Plan Note (Signed)
She will try cutting back on her furosemide and see if her peripheral edema remains the same

## 2014-06-07 NOTE — Addendum Note (Signed)
Addended by: Alvina Filbert B on: 06/07/2014 05:31 PM   Modules accepted: Orders

## 2014-06-23 DIAGNOSIS — N3000 Acute cystitis without hematuria: Secondary | ICD-10-CM | POA: Diagnosis not present

## 2014-06-23 DIAGNOSIS — N3946 Mixed incontinence: Secondary | ICD-10-CM | POA: Diagnosis not present

## 2014-08-15 DIAGNOSIS — E782 Mixed hyperlipidemia: Secondary | ICD-10-CM | POA: Diagnosis not present

## 2014-08-15 DIAGNOSIS — D631 Anemia in chronic kidney disease: Secondary | ICD-10-CM | POA: Diagnosis not present

## 2014-08-15 DIAGNOSIS — E11319 Type 2 diabetes mellitus with unspecified diabetic retinopathy without macular edema: Secondary | ICD-10-CM | POA: Diagnosis not present

## 2014-08-18 DIAGNOSIS — E782 Mixed hyperlipidemia: Secondary | ICD-10-CM | POA: Diagnosis not present

## 2014-08-18 DIAGNOSIS — E11339 Type 2 diabetes mellitus with moderate nonproliferative diabetic retinopathy without macular edema: Secondary | ICD-10-CM | POA: Diagnosis not present

## 2014-08-18 DIAGNOSIS — N182 Chronic kidney disease, stage 2 (mild): Secondary | ICD-10-CM | POA: Diagnosis not present

## 2014-08-18 DIAGNOSIS — I10 Essential (primary) hypertension: Secondary | ICD-10-CM | POA: Diagnosis not present

## 2014-08-18 DIAGNOSIS — E559 Vitamin D deficiency, unspecified: Secondary | ICD-10-CM | POA: Diagnosis not present

## 2014-08-18 DIAGNOSIS — E1142 Type 2 diabetes mellitus with diabetic polyneuropathy: Secondary | ICD-10-CM | POA: Diagnosis not present

## 2014-08-18 DIAGNOSIS — D631 Anemia in chronic kidney disease: Secondary | ICD-10-CM | POA: Diagnosis not present

## 2014-08-18 DIAGNOSIS — Z23 Encounter for immunization: Secondary | ICD-10-CM | POA: Diagnosis not present

## 2014-09-27 DIAGNOSIS — Z1231 Encounter for screening mammogram for malignant neoplasm of breast: Secondary | ICD-10-CM | POA: Diagnosis not present

## 2014-10-03 ENCOUNTER — Other Ambulatory Visit: Payer: Self-pay | Admitting: Cardiology

## 2014-10-04 DIAGNOSIS — E11359 Type 2 diabetes mellitus with proliferative diabetic retinopathy without macular edema: Secondary | ICD-10-CM | POA: Diagnosis not present

## 2014-10-04 DIAGNOSIS — E119 Type 2 diabetes mellitus without complications: Secondary | ICD-10-CM | POA: Diagnosis not present

## 2014-10-04 DIAGNOSIS — D3131 Benign neoplasm of right choroid: Secondary | ICD-10-CM | POA: Diagnosis not present

## 2014-10-04 DIAGNOSIS — E11351 Type 2 diabetes mellitus with proliferative diabetic retinopathy with macular edema: Secondary | ICD-10-CM | POA: Diagnosis not present

## 2014-11-21 DIAGNOSIS — E782 Mixed hyperlipidemia: Secondary | ICD-10-CM | POA: Diagnosis not present

## 2014-11-21 DIAGNOSIS — E11339 Type 2 diabetes mellitus with moderate nonproliferative diabetic retinopathy without macular edema: Secondary | ICD-10-CM | POA: Diagnosis not present

## 2014-11-21 DIAGNOSIS — E559 Vitamin D deficiency, unspecified: Secondary | ICD-10-CM | POA: Diagnosis not present

## 2014-11-21 DIAGNOSIS — D631 Anemia in chronic kidney disease: Secondary | ICD-10-CM | POA: Diagnosis not present

## 2014-11-30 ENCOUNTER — Encounter: Payer: Self-pay | Admitting: Cardiology

## 2014-12-05 ENCOUNTER — Ambulatory Visit: Payer: Medicare Other | Admitting: Cardiology

## 2014-12-05 DIAGNOSIS — D631 Anemia in chronic kidney disease: Secondary | ICD-10-CM | POA: Diagnosis not present

## 2014-12-05 DIAGNOSIS — E1142 Type 2 diabetes mellitus with diabetic polyneuropathy: Secondary | ICD-10-CM | POA: Diagnosis not present

## 2014-12-05 DIAGNOSIS — E11339 Type 2 diabetes mellitus with moderate nonproliferative diabetic retinopathy without macular edema: Secondary | ICD-10-CM | POA: Diagnosis not present

## 2014-12-05 DIAGNOSIS — N182 Chronic kidney disease, stage 2 (mild): Secondary | ICD-10-CM | POA: Diagnosis not present

## 2014-12-05 DIAGNOSIS — E559 Vitamin D deficiency, unspecified: Secondary | ICD-10-CM | POA: Diagnosis not present

## 2014-12-05 DIAGNOSIS — I10 Essential (primary) hypertension: Secondary | ICD-10-CM | POA: Diagnosis not present

## 2014-12-05 DIAGNOSIS — E782 Mixed hyperlipidemia: Secondary | ICD-10-CM | POA: Diagnosis not present

## 2014-12-06 ENCOUNTER — Encounter: Payer: Self-pay | Admitting: Cardiology

## 2014-12-06 ENCOUNTER — Ambulatory Visit (INDEPENDENT_AMBULATORY_CARE_PROVIDER_SITE_OTHER): Payer: Medicare Other | Admitting: Cardiology

## 2014-12-06 VITALS — BP 142/78 | HR 59 | Ht 64.0 in | Wt 220.0 lb

## 2014-12-06 DIAGNOSIS — I5032 Chronic diastolic (congestive) heart failure: Secondary | ICD-10-CM

## 2014-12-06 DIAGNOSIS — E0840 Diabetes mellitus due to underlying condition with diabetic neuropathy, unspecified: Secondary | ICD-10-CM

## 2014-12-06 NOTE — Patient Instructions (Signed)
Your physician recommends that you continue on your current medications as directed. Please refer to the Current Medication list given to you today.  Your physician wants you to follow-up in: 6 MONTH OV  You will receive a reminder letter in the mail two months in advance. If you don't receive a letter, please call our office to schedule the follow-up appointment.  

## 2014-12-06 NOTE — Progress Notes (Signed)
Cardiology Office Note   Date:  12/06/2014   ID:  [name], DOB [birth year], MRN [id number]  PCP:  Limmie Patricia, MD  Cardiologist:   Darlin Coco, MD   No chief complaint on file.     History of Present Illness: [name] is a 81 y.o. female who presents for scheduled follow-up office visit.  This pleasant 81 year old woman is seen for a scheduled four-month followup office visit. She has a past history of diastolic congestive heart failure. Her last echocardiogram on 02/10/12 showed an ejection fraction of 60% with normal systolic function and grade 1 diastolic dysfunction. She has a past history of pericardial effusion with pericardial tamponade and in 2010 and underwen a pericardial window with good results. she has a history of mild aortic stenosis. Since we last saw her she underwent left hip replacement by Dr. Mayer Camel and has done well.  Her last echocardiogram was 02/10/12 and showed an ejection fraction of 60%. Since last visit she has been feeling well. Since last visit her husband has been diagnosed with early Alzheimer's but fortunately he seems to be responding nicely to Aricept.Her last visit we gave her a trial off gabapentin for peripheral neuropathy.  However it did not help her and she is no longer taking it. She reports that she is only taking 80 mg of Lasix daily and this has been sufficient to keep her edema at a minimum.  Past Medical History  Diagnosis Date  . Diabetes mellitus     insulin dependent  . Dyspnea   . Anasarca 04/2009    found to be secondary to pericardial effusion with tamponade/pericardial window done  . Hypertension   . Coronary artery disease   . Lumbar scoliosis   . Spondylolisthesis   . Spondylosis   . Lumbar radiculopathy   . Morbid obesity   . Pericardial effusion  04/2009     with tamponade  . Diastolic congestive heart failure      Chronic diastolic congestive heart failure  . Chronic kidney disease   . Herpes  labialis     Healing herpes labialis  . Shortness of breath  04/20/2009  . Hypercholesterolemia   . PONV (postoperative nausea and vomiting)     has not had any problems since 2001  . Bladder infection     pt has been on antibiotic .for this  this week .     Past Surgical History  Procedure Laterality Date  . Subxyphoid pericardial window  04/2009    for pericardial effusion w/pericardial tamponade/sac drained w/20-French Baard drain/transesophageal echocardiogram confimed complete evacution of pericardial fluid  . Lumbar laminectomy    . Cardiac catheterization  04/25/2003    L heart cath w/coronary angiography/R femoral artery/ EF 65%/no mitral regurgitation/ angiography L main coronary artery smooth & normal  . Vaginal hysterectomy    . Breast biopsy      left  . Back surgery      microdiskectomy L3-4 on L/partial facetectomy 3-4 on L/removal of synovial cyst on left L3-4  . Eye surgery      ophthalmoscopy/peritomy adjacent to the limbus from the 8 to 2:30 position superiorly  . Total hip arthroplasty  03/06/2012    Procedure: TOTAL HIP ARTHROPLASTY;  Surgeon: Kerin Salen, MD;  Location: [facility];  Service: Orthopedics;  Laterality: Left;  DEPUY/PINNACLE, SUMMIT STEM, CUP POLY & CERAMIC     Current Outpatient Prescriptions  Medication Sig Dispense Refill  . amLODipine (NORVASC) 10 MG tablet  Take 5 mg by mouth daily.     Marland Kitchen atorvastatin (LIPITOR) 80 MG tablet Take 40 mg by mouth every evening.    . ergocalciferol (VITAMIN D2) 50000 UNITS capsule Take 50,000 Units by mouth once a week. On friday    . furosemide (LASIX) 80 MG tablet Take 80 mg by mouth daily.    Marland Kitchen GLIPIZIDE XL 5 MG 24 hr tablet Take 5 mg by mouth Daily.    . insulin aspart (NOVOLOG) 100 UNIT/ML injection Inject into the skin as directed. Sliding scale    . insulin glargine (LANTUS) 100 UNIT/ML injection Inject 20 Units into the skin at bedtime.     . IRON PO Take 1 tablet by mouth daily.     . metoprolol tartrate  (LOPRESSOR) 25 MG tablet Take 25 mg by mouth 2 (two) times daily.    . pioglitazone (ACTOS) 15 MG tablet Take 15 mg by mouth daily.    . valsartan (DIOVAN) 320 MG tablet Take 320 mg by mouth daily.      No current facility-administered medications for this visit.    Allergies:   Codeine; Gabapentin; Lactose intolerance (gi); Lodine; Percocet; and Sulfa drugs cross reactors    Social History:  The patient  reports that she has never smoked. She does not have any smokeless tobacco history on file. She reports that she does not drink alcohol or use illicit drugs.   Family History:  The patient's family history includes Angina in her mother; Coronary artery disease in an other family member; Heart attack in her father; Hypertension in her father; Stroke in her mother.    ROS:  Please see the history of present illness.   Otherwise, review of systems are positive for none.   All other systems are reviewed and negative.    PHYSICAL EXAM: VS:  BP 142/78 mmHg  Pulse 59  Ht 5\' 4"  (1.626 m)  Wt 220 lb (99.791 kg)  BMI 37.74 kg/m2 , BMI Body mass index is 37.74 kg/(m^2). GEN: Well nourished, well developed, moderately obese, in no acute distress HEENT: normal Neck: no JVD, carotid bruits, or masses Cardiac: RRR; no murmurs, rubs, or gallops, there is 1+ edema. Respiratory:  clear to auscultation bilaterally, normal work of breathing GI: soft, nontender, nondistended, + BS MS: no deformity or atrophy Skin: warm and dry, no rash Neuro:  Strength and sensation are intact Psych: euthymic mood, full affect   EKG:  EKG is not ordered today.    Recent Labs: No results found for requested labs within last 365 days.    Lipid Panel    Component Value Date/Time   CHOL  04/21/2009 0620    85        ATP III CLASSIFICATION:  <200     mg/dL   Desirable  200-239  mg/dL   Borderline High  >=240    mg/dL   High          TRIG 93 04/21/2009 0620   HDL 27* 04/21/2009 0620   CHOLHDL 3.1  04/21/2009 0620   VLDL 19 04/21/2009 0620   LDLCALC  04/21/2009 0620    39        Total Cholesterol/HDL:CHD Risk Coronary Heart Disease Risk Table                     Men   Women  1/2 Average Risk   3.4   3.3  Average Risk       5.0  4.4  2 X Average Risk   9.6   7.1  3 X Average Risk  23.4   11.0        Use the calculated Patient Ratio above and the CHD Risk Table to determine the patient's CHD Risk.        ATP III CLASSIFICATION (LDL):  <100     mg/dL   Optimal  100-129  mg/dL   Near or Above                    Optimal  130-159  mg/dL   Borderline  160-189  mg/dL   High  >190     mg/dL   Very High      Wt Readings from Last 3 Encounters:  12/06/14 220 lb (99.791 kg)  06/07/14 222 lb (100.699 kg)  12/07/13 217 lb 12.8 oz (98.793 kg)      Other studies Reviewed:    ASSESSMENT AND PLAN:   1. Chronic diastolic heart failure 2. status post pericardial window for pericardial effusion with pericardial tamponade in 2010 3. diabetes mellitus with diabetic neuropathy 4. essential hypertension, stable  Current medicines are reviewed at length with the patient today.  The patient does not have concerns regarding medicines.  The following changes have been made:  no change   No orders of the defined types were placed in this encounter.     Disposition:   FU with Dr. Mare Ferrari in 6 months for office visit and EKG.   Signed, Darlin Coco, MD  12/06/2014 2:56 PM    [facility] [place], [place], [place]  [address] Phone: [phone]; Fax: [phone]

## 2015-02-01 DIAGNOSIS — M7752 Other enthesopathy of left foot: Secondary | ICD-10-CM | POA: Diagnosis not present

## 2015-02-01 DIAGNOSIS — M79672 Pain in left foot: Secondary | ICD-10-CM | POA: Diagnosis not present

## 2015-02-01 DIAGNOSIS — G5762 Lesion of plantar nerve, left lower limb: Secondary | ICD-10-CM | POA: Diagnosis not present

## 2015-02-01 DIAGNOSIS — M25572 Pain in left ankle and joints of left foot: Secondary | ICD-10-CM | POA: Diagnosis not present

## 2015-02-09 DIAGNOSIS — M25572 Pain in left ankle and joints of left foot: Secondary | ICD-10-CM | POA: Diagnosis not present

## 2015-02-09 DIAGNOSIS — M10072 Idiopathic gout, left ankle and foot: Secondary | ICD-10-CM | POA: Diagnosis not present

## 2015-02-09 DIAGNOSIS — M7752 Other enthesopathy of left foot: Secondary | ICD-10-CM | POA: Diagnosis not present

## 2015-02-09 DIAGNOSIS — M10079 Idiopathic gout, unspecified ankle and foot: Secondary | ICD-10-CM | POA: Diagnosis not present

## 2015-02-20 DIAGNOSIS — M792 Neuralgia and neuritis, unspecified: Secondary | ICD-10-CM | POA: Diagnosis not present

## 2015-02-20 DIAGNOSIS — M10079 Idiopathic gout, unspecified ankle and foot: Secondary | ICD-10-CM | POA: Diagnosis not present

## 2015-03-06 DIAGNOSIS — E782 Mixed hyperlipidemia: Secondary | ICD-10-CM | POA: Diagnosis not present

## 2015-03-06 DIAGNOSIS — D631 Anemia in chronic kidney disease: Secondary | ICD-10-CM | POA: Diagnosis not present

## 2015-03-06 DIAGNOSIS — E11339 Type 2 diabetes mellitus with moderate nonproliferative diabetic retinopathy without macular edema: Secondary | ICD-10-CM | POA: Diagnosis not present

## 2015-03-09 DIAGNOSIS — E11339 Type 2 diabetes mellitus with moderate nonproliferative diabetic retinopathy without macular edema: Secondary | ICD-10-CM | POA: Diagnosis not present

## 2015-03-09 DIAGNOSIS — E559 Vitamin D deficiency, unspecified: Secondary | ICD-10-CM | POA: Diagnosis not present

## 2015-03-09 DIAGNOSIS — I10 Essential (primary) hypertension: Secondary | ICD-10-CM | POA: Diagnosis not present

## 2015-03-09 DIAGNOSIS — E782 Mixed hyperlipidemia: Secondary | ICD-10-CM | POA: Diagnosis not present

## 2015-03-09 DIAGNOSIS — N182 Chronic kidney disease, stage 2 (mild): Secondary | ICD-10-CM | POA: Diagnosis not present

## 2015-03-09 DIAGNOSIS — D631 Anemia in chronic kidney disease: Secondary | ICD-10-CM | POA: Diagnosis not present

## 2015-03-09 DIAGNOSIS — E1142 Type 2 diabetes mellitus with diabetic polyneuropathy: Secondary | ICD-10-CM | POA: Diagnosis not present

## 2015-03-21 DIAGNOSIS — D3131 Benign neoplasm of right choroid: Secondary | ICD-10-CM | POA: Diagnosis not present

## 2015-03-21 DIAGNOSIS — E11359 Type 2 diabetes mellitus with proliferative diabetic retinopathy without macular edema: Secondary | ICD-10-CM | POA: Diagnosis not present

## 2015-06-06 DIAGNOSIS — E782 Mixed hyperlipidemia: Secondary | ICD-10-CM | POA: Diagnosis not present

## 2015-06-06 DIAGNOSIS — D631 Anemia in chronic kidney disease: Secondary | ICD-10-CM | POA: Diagnosis not present

## 2015-06-06 DIAGNOSIS — E559 Vitamin D deficiency, unspecified: Secondary | ICD-10-CM | POA: Diagnosis not present

## 2015-06-06 DIAGNOSIS — E11339 Type 2 diabetes mellitus with moderate nonproliferative diabetic retinopathy without macular edema: Secondary | ICD-10-CM | POA: Diagnosis not present

## 2015-06-08 DIAGNOSIS — E11339 Type 2 diabetes mellitus with moderate nonproliferative diabetic retinopathy without macular edema: Secondary | ICD-10-CM | POA: Diagnosis not present

## 2015-06-08 DIAGNOSIS — D631 Anemia in chronic kidney disease: Secondary | ICD-10-CM | POA: Diagnosis not present

## 2015-06-08 DIAGNOSIS — E782 Mixed hyperlipidemia: Secondary | ICD-10-CM | POA: Diagnosis not present

## 2015-06-08 DIAGNOSIS — N182 Chronic kidney disease, stage 2 (mild): Secondary | ICD-10-CM | POA: Diagnosis not present

## 2015-06-08 DIAGNOSIS — E1142 Type 2 diabetes mellitus with diabetic polyneuropathy: Secondary | ICD-10-CM | POA: Diagnosis not present

## 2015-06-08 DIAGNOSIS — E559 Vitamin D deficiency, unspecified: Secondary | ICD-10-CM | POA: Diagnosis not present

## 2015-06-08 DIAGNOSIS — I10 Essential (primary) hypertension: Secondary | ICD-10-CM | POA: Diagnosis not present

## 2015-06-12 ENCOUNTER — Other Ambulatory Visit: Payer: Self-pay | Admitting: Cardiology

## 2015-06-12 DIAGNOSIS — G629 Polyneuropathy, unspecified: Secondary | ICD-10-CM

## 2015-06-27 DIAGNOSIS — N3946 Mixed incontinence: Secondary | ICD-10-CM | POA: Diagnosis not present

## 2015-08-07 DIAGNOSIS — E1165 Type 2 diabetes mellitus with hyperglycemia: Secondary | ICD-10-CM | POA: Diagnosis not present

## 2015-08-07 DIAGNOSIS — J209 Acute bronchitis, unspecified: Secondary | ICD-10-CM | POA: Diagnosis not present

## 2015-08-07 DIAGNOSIS — J069 Acute upper respiratory infection, unspecified: Secondary | ICD-10-CM | POA: Diagnosis not present

## 2015-08-14 ENCOUNTER — Emergency Department (HOSPITAL_COMMUNITY): Payer: Medicare Other

## 2015-08-14 ENCOUNTER — Emergency Department (HOSPITAL_COMMUNITY)
Admission: EM | Admit: 2015-08-14 | Discharge: 2015-08-14 | Disposition: A | Discharge status: Home / Self Care | Payer: Medicare Other | Attending: Emergency Medicine | Admitting: Emergency Medicine

## 2015-08-14 ENCOUNTER — Encounter (HOSPITAL_COMMUNITY): Payer: Self-pay | Admitting: *Deleted

## 2015-08-14 DIAGNOSIS — J40 Bronchitis, not specified as acute or chronic: Secondary | ICD-10-CM | POA: Diagnosis not present

## 2015-08-14 DIAGNOSIS — E119 Type 2 diabetes mellitus without complications: Secondary | ICD-10-CM | POA: Diagnosis not present

## 2015-08-14 DIAGNOSIS — I5030 Unspecified diastolic (congestive) heart failure: Secondary | ICD-10-CM | POA: Diagnosis not present

## 2015-08-14 DIAGNOSIS — Z79899 Other long term (current) drug therapy: Secondary | ICD-10-CM | POA: Diagnosis not present

## 2015-08-14 DIAGNOSIS — N189 Chronic kidney disease, unspecified: Secondary | ICD-10-CM | POA: Diagnosis not present

## 2015-08-14 DIAGNOSIS — J209 Acute bronchitis, unspecified: Secondary | ICD-10-CM | POA: Diagnosis not present

## 2015-08-14 DIAGNOSIS — R05 Cough: Secondary | ICD-10-CM | POA: Diagnosis present

## 2015-08-14 DIAGNOSIS — I129 Hypertensive chronic kidney disease with stage 1 through stage 4 chronic kidney disease, or unspecified chronic kidney disease: Secondary | ICD-10-CM | POA: Insufficient documentation

## 2015-08-14 DIAGNOSIS — I2510 Atherosclerotic heart disease of native coronary artery without angina pectoris: Secondary | ICD-10-CM | POA: Diagnosis not present

## 2015-08-14 LAB — CBC WITH DIFFERENTIAL/PLATELET
Basophils Absolute: 0 10*3/uL (ref 0.0–0.1)
Basophils Relative: 0 %
Eosinophils Absolute: 0.4 10*3/uL (ref 0.0–0.7)
Eosinophils Relative: 4 %
HCT: 36 % (ref 36.0–46.0)
Hemoglobin: 11.3 g/dL — ABNORMAL LOW (ref 12.0–15.0)
Lymphocytes Relative: 12 %
Lymphs Abs: 1.2 10*3/uL (ref 0.7–4.0)
MCH: 28.3 pg (ref 26.0–34.0)
MCHC: 31.4 g/dL (ref 30.0–36.0)
MCV: 90.2 fL (ref 78.0–100.0)
Monocytes Absolute: 0.6 10*3/uL (ref 0.1–1.0)
Monocytes Relative: 6 %
Neutro Abs: 7.5 10*3/uL (ref 1.7–7.7)
Neutrophils Relative %: 78 %
Platelets: 211 10*3/uL (ref 150–400)
RBC: 3.99 MIL/uL (ref 3.87–5.11)
RDW: 14.6 % (ref 11.5–15.5)
WBC: 9.7 10*3/uL (ref 4.0–10.5)

## 2015-08-14 LAB — COMPREHENSIVE METABOLIC PANEL
ALT: 13 U/L — ABNORMAL LOW (ref 14–54)
AST: 18 U/L (ref 15–41)
Albumin: 2.6 g/dL — ABNORMAL LOW (ref 3.5–5.0)
Alkaline Phosphatase: 70 U/L (ref 38–126)
Anion gap: 9 (ref 5–15)
BUN: 25 mg/dL — ABNORMAL HIGH (ref 6–20)
CO2: 27 mmol/L (ref 22–32)
Calcium: 8.6 mg/dL — ABNORMAL LOW (ref 8.9–10.3)
Chloride: 108 mmol/L (ref 101–111)
Creatinine, Ser: 1.33 mg/dL — ABNORMAL HIGH (ref 0.44–1.00)
GFR calc Af Amer: 42 mL/min — ABNORMAL LOW (ref >60)
GFR calc non Af Amer: 36 mL/min — ABNORMAL LOW (ref >60)
Glucose, Bld: 103 mg/dL — ABNORMAL HIGH (ref 65–99)
Potassium: 4.1 mmol/L (ref 3.5–5.1)
Sodium: 144 mmol/L (ref 135–145)
Total Bilirubin: 0.3 mg/dL (ref 0.3–1.2)
Total Protein: 6.1 g/dL — ABNORMAL LOW (ref 6.5–8.1)

## 2015-08-14 LAB — BRAIN NATRIURETIC PEPTIDE: B Natriuretic Peptide: 213.2 pg/mL — ABNORMAL HIGH (ref 0.0–100.0)

## 2015-08-14 LAB — I-STAT TROPONIN, ED: Troponin i, poc: 0 ng/mL (ref 0.00–0.08)

## 2015-08-14 MED ORDER — GUAIFENESIN 100 MG/5ML PO LIQD: 100.0000 mg | ORAL | Status: DC | PRN

## 2015-08-14 MED ORDER — ALBUTEROL SULFATE HFA 108 (90 BASE) MCG/ACT IN AERS: 2.0000 | INHALATION_SPRAY | RESPIRATORY_TRACT | Status: DC | PRN

## 2015-08-14 MED ORDER — IPRATROPIUM-ALBUTEROL 0.5-2.5 (3) MG/3ML IN SOLN
3.0000 mL | Freq: Once | RESPIRATORY_TRACT | Status: AC
  Administered 2015-08-14: 3 mL via RESPIRATORY_TRACT
  Filled 2015-08-14: qty 3

## 2015-08-14 MED ORDER — AZITHROMYCIN 250 MG PO TABS: 250.0000 mg | ORAL_TABLET | Freq: Every day | ORAL | Status: DC

## 2015-08-14 NOTE — Discharge Instructions (Signed)
Albuterol inhaler 2 puffs every 4 hrs as needed. Robitussin for cough. zithromax until all gone for possible infection. follow up closely with primary care doctor.   Acute Bronchitis Bronchitis is inflammation of the airways that extend from the windpipe into the lungs (bronchi). The inflammation often causes mucus to develop. This leads to a cough, which is the most common symptom of bronchitis.  In acute bronchitis, the condition usually develops suddenly and goes away over time, usually in a couple weeks. Smoking, allergies, and asthma can make bronchitis worse. Repeated episodes of bronchitis may cause further lung problems.  CAUSES Acute bronchitis is most often caused by the same virus that causes a cold. The virus can spread from person to person (contagious) through coughing, sneezing, and touching contaminated objects. SIGNS AND SYMPTOMS   Cough.   Fever.   Coughing up mucus.   Body aches.   Chest congestion.   Chills.   Shortness of breath.   Sore throat.  DIAGNOSIS  Acute bronchitis is usually diagnosed through a physical exam. Your health care provider will also ask you questions about your medical history. Tests, such as chest X-rays, are sometimes done to rule out other conditions.  TREATMENT  Acute bronchitis usually goes away in a couple weeks. Oftentimes, no medical treatment is necessary. Medicines are sometimes given for relief of fever or cough. Antibiotic medicines are usually not needed but may be prescribed in certain situations. In some cases, an inhaler may be recommended to help reduce shortness of breath and control the cough. A cool mist vaporizer may also be used to help thin bronchial secretions and make it easier to clear the chest.  HOME CARE INSTRUCTIONS  Get plenty of rest.   Drink enough fluids to keep your urine clear or pale yellow (unless you have a medical condition that requires fluid restriction). Increasing fluids may help thin your  respiratory secretions (sputum) and reduce chest congestion, and it will prevent dehydration.   Take medicines only as directed by your health care provider.  If you were prescribed an antibiotic medicine, finish it all even if you start to feel better.  Avoid smoking and secondhand smoke. Exposure to cigarette smoke or irritating chemicals will make bronchitis worse. If you are a smoker, consider using nicotine gum or skin patches to help control withdrawal symptoms. Quitting smoking will help your lungs heal faster.   Reduce the chances of another bout of acute bronchitis by washing your hands frequently, avoiding people with cold symptoms, and trying not to touch your hands to your mouth, nose, or eyes.   Keep all follow-up visits as directed by your health care provider.  SEEK MEDICAL CARE IF: Your symptoms do not improve after 1 week of treatment.  SEEK IMMEDIATE MEDICAL CARE IF:  You develop an increased fever or chills.   You have chest pain.   You have severe shortness of breath.  You have bloody sputum.   You develop dehydration.  You faint or repeatedly feel like you are going to pass out.  You develop repeated vomiting.  You develop a severe headache. MAKE SURE YOU:   Understand these instructions.  Will watch your condition.  Will get help right away if you are not doing well or get worse.   This information is not intended to replace advice given to you by your health care provider. Make sure you discuss any questions you have with your health care provider.   Document Released: 11/28/2004 Document Revised: 11/11/2014 Document  Reviewed: 04/13/2013 Elsevier Interactive Patient Education [employer].

## 2015-08-14 NOTE — ED Notes (Signed)
Patient transported to X-ray 

## 2015-08-14 NOTE — ED Notes (Signed)
Pt is in stable condition upon d/c and is escorted from ED via wheelchair. 

## 2015-08-14 NOTE — ED Notes (Signed)
Reports productive cough and chest congestion x 1 week. pcp started pt on antibiotics with no relief. Denies fever.

## 2015-08-14 NOTE — ED Provider Notes (Signed)
CSN: [id number]     Arrival date & time 08/14/15  1324 History   First MD Initiated Contact with Patient 08/14/15 [phone]     Chief Complaint  Patient presents with  . Cough     (Consider location/radiation/quality/duration/timing/severity/associated sxs/prior Treatment) HPI [name] is a 81 y.o. female history of diabetes, CHF, hypertension, coronary disease, presents to emergency room complaining of cough. Patient has had cough for a week. She reports some nasal congestion. She went to urgent care when symptoms began and was given prescription for amoxicillin and Tessalon Perles. Patient states that her symptoms are not improving. She reports some shortness of breath and wheezing. States cough is unproductive. Denies any fever or chills. No chest pain. Denies worsening swelling of extremities. No other complaints.  Past Medical History  Diagnosis Date  . Diabetes mellitus     insulin dependent  . Dyspnea   . Anasarca 04/2009    found to be secondary to pericardial effusion with tamponade/pericardial window done  . Hypertension   . Coronary artery disease   . Lumbar scoliosis   . Spondylolisthesis   . Spondylosis   . Lumbar radiculopathy   . Morbid obesity ([place])   . Pericardial effusion  04/2009     with tamponade  . Diastolic congestive heart failure (HCC)      Chronic diastolic congestive heart failure  . Chronic kidney disease   . Herpes labialis     Healing herpes labialis  . Shortness of breath  04/20/2009  . Hypercholesterolemia   . PONV (postoperative nausea and vomiting)     has not had any problems since 2001  . Bladder infection     pt has been on antibiotic .for this  this week .    Past Surgical History  Procedure Laterality Date  . Subxyphoid pericardial window  04/2009    for pericardial effusion w/pericardial tamponade/sac drained w/20-French Baard drain/transesophageal echocardiogram confimed complete evacution of pericardial fluid  . Lumbar laminectomy     . Cardiac catheterization  04/25/2003    L heart cath w/coronary angiography/R femoral artery/ EF 65%/no mitral regurgitation/ angiography L main coronary artery smooth & normal  . Vaginal hysterectomy    . Breast biopsy      left  . Back surgery      microdiskectomy L3-4 on L/partial facetectomy 3-4 on L/removal of synovial cyst on left L3-4  . Eye surgery      ophthalmoscopy/peritomy adjacent to the limbus from the 8 to 2:30 position superiorly  . Total hip arthroplasty  03/06/2012    Procedure: TOTAL HIP ARTHROPLASTY;  Surgeon: Kerin Salen, MD;  Location: [facility];  Service: Orthopedics;  Laterality: Left;  DEPUY/PINNACLE, SUMMIT STEM, CUP POLY & CERAMIC   Family History  Problem Relation Age of Onset  . Heart attack Father   . Hypertension Father   . Stroke Mother   . Angina Mother   . Coronary artery disease     Social History  Substance Use Topics  . Smoking status: Never Smoker   . Smokeless tobacco: None  . Alcohol Use: No   OB History    No data available     Review of Systems  Constitutional: Negative for fever and chills.  Respiratory: Positive for cough, shortness of breath and wheezing. Negative for chest tightness.   Cardiovascular: Positive for leg swelling. Negative for chest pain and palpitations.  Gastrointestinal: Negative for nausea, vomiting, abdominal pain and diarrhea.  Musculoskeletal: Negative for myalgias, arthralgias, neck  pain and neck stiffness.  Skin: Negative for rash.  Neurological: Negative for dizziness, weakness and headaches.  All other systems reviewed and are negative.     Allergies  Codeine; Gabapentin; Lactose intolerance (gi); Lodine; Percocet; and Sulfa drugs cross reactors  Home Medications   Prior to Admission medications   Medication Sig Start Date End Date Taking? Authorizing Provider  amLODipine (NORVASC) 10 MG tablet Take 5 mg by mouth daily.     Historical Provider, MD  atorvastatin (LIPITOR) 80 MG tablet Take 40 mg  by mouth every evening.    Historical Provider, MD  ergocalciferol (VITAMIN D2) 50000 UNITS capsule Take 50,000 Units by mouth once a week. On friday 07/30/11   Historical Provider, MD  furosemide (LASIX) 80 MG tablet Take 80 mg by mouth daily.    Historical Provider, MD  gabapentin (NEURONTIN) 300 MG capsule take 1 capsule by mouth at bedtime 06/12/15   Darlin Coco, MD  GLIPIZIDE XL 5 MG 24 hr tablet Take 5 mg by mouth Daily. 06/12/12   Historical Provider, MD  insulin aspart (NOVOLOG) 100 UNIT/ML injection Inject into the skin as directed. Sliding scale    Historical Provider, MD  insulin glargine (LANTUS) 100 UNIT/ML injection Inject 20 Units into the skin at bedtime.     Historical Provider, MD  IRON PO Take 1 tablet by mouth daily.     Historical Provider, MD  metoprolol tartrate (LOPRESSOR) 25 MG tablet Take 25 mg by mouth 2 (two) times daily.    Historical Provider, MD  pioglitazone (ACTOS) 15 MG tablet Take 15 mg by mouth daily.    Historical Provider, MD  valsartan (DIOVAN) 320 MG tablet Take 320 mg by mouth daily.     Historical Provider, MD   BP 129/46 mmHg  Pulse 65  Temp(Src) 99.2 F (37.3 C) (Oral)  Resp 18  SpO2 93% Physical Exam  Constitutional: She appears well-developed and well-nourished. No distress.  HENT:  Head: Normocephalic.  Right Ear: External ear normal.  Left Ear: External ear normal.  Nose: Nose normal.  Mouth/Throat: Oropharynx is clear and moist.  Eyes: Conjunctivae are normal.  Neck: Neck supple.  Cardiovascular: Normal rate, regular rhythm and normal heart sounds.   Pulmonary/Chest: Effort normal. No respiratory distress. She has wheezes. She has no rales.  Expiratory wheezes in all lung fields bilaterally  Abdominal: Soft. Bowel sounds are normal. She exhibits no distension. There is no tenderness. There is no rebound.  Musculoskeletal: She exhibits no edema.  Neurological: She is alert.  Skin: Skin is warm and dry.  Psychiatric: She has a normal  mood and affect. Her behavior is normal.  Nursing note and vitals reviewed.   ED Course  Procedures (including critical care time) Labs Review Labs Reviewed  CBC WITH DIFFERENTIAL/PLATELET - Abnormal; Notable for the following:    Hemoglobin 11.3 (*)    All other components within normal limits  COMPREHENSIVE METABOLIC PANEL - Abnormal; Notable for the following:    Glucose, Bld 103 (*)    BUN 25 (*)    Creatinine, Ser 1.33 (*)    Calcium 8.6 (*)    Albumin 2.6 (*)    ALT 13 (*)    GFR calc non Af Amer 36 (*)    GFR calc Af Amer 42 (*)    All other components within normal limits  BRAIN NATRIURETIC PEPTIDE - Abnormal; Notable for the following:    B Natriuretic Peptide 213.2 (*)    All other components within normal  limits  I-STAT TROPOININ, ED    Imaging Review Dg Chest 2 View  08/14/2015   CLINICAL DATA:  Productive cough, chest congestion for 1 week.  EXAM: CHEST  2 VIEW  COMPARISON:  04/29/2011  FINDINGS: Mild cardiomegaly. No confluent airspace opacities, effusions or edema. No acute bony abnormality.  IMPRESSION: Mild cardiomegaly.  No active disease.   Electronically Signed   By: Rolm Baptise M.D.   On: 08/14/2015 12:06   I have personally reviewed and evaluated these images and lab results as part of my medical decision-making.   EKG Interpretation   Date/Time:  Monday August 14 2015 10:56:59 EDT Ventricular Rate:  66 PR Interval:  197 QRS Duration: 110 QT Interval:  414 QTC Calculation: 434 R Axis:   -61 Text Interpretation:  Sinus rhythm Left anterior fascicular block Abnormal  R-wave progression, late transition Nonspecific T abnormalities, lateral  leads T waves appear better compared to 2012 Confirmed by GOLDSTON  MD,  SCOTT ([id number]) on 08/14/2015 11:04:43 AM      MDM   Final diagnoses:  Bronchitis    Patient with cough for a week. Her lung exam is positive for expiratory wheezes bilaterally. Her vital signs are normal. We'll try no treatments.  Patient has extensive history of coronary disease and CHF, will get labs including troponin, EKG, BNP.   1:23 PM Patient received 2 neb treatments, she is feeling much better. Her labs are unremarkable, no evidence of acute ACS, especially given no chest pain and symptoms constant for the last week with negative troponin. Her BNP is only mildly elevated. She denies any worsening swelling of extremities. Her chest x-ray does not show pulmonary edema, no evidence of pneumonia. I'll discharge home with inhaler, Zithromax, Robitussin for cough. Follow-up as needed.  Filed Vitals:   08/14/15 1000 08/14/15 1030 08/14/15 1100 08/14/15 1200  BP: 121/45 133/50 132/72 129/46  Pulse: 66 67 64 65  Temp:      TempSrc:      Resp:   16 18  SpO2: 95% 96% 100% 93%       Jeannett Senior, PA-C 08/14/15 [place], MD 08/15/15 [phone]

## 2015-09-12 DIAGNOSIS — E782 Mixed hyperlipidemia: Secondary | ICD-10-CM | POA: Diagnosis not present

## 2015-09-12 DIAGNOSIS — D631 Anemia in chronic kidney disease: Secondary | ICD-10-CM | POA: Diagnosis not present

## 2015-09-12 DIAGNOSIS — E113393 Type 2 diabetes mellitus with moderate nonproliferative diabetic retinopathy without macular edema, bilateral: Secondary | ICD-10-CM | POA: Diagnosis not present

## 2015-09-14 DIAGNOSIS — E559 Vitamin D deficiency, unspecified: Secondary | ICD-10-CM | POA: Diagnosis not present

## 2015-09-14 DIAGNOSIS — E113393 Type 2 diabetes mellitus with moderate nonproliferative diabetic retinopathy without macular edema, bilateral: Secondary | ICD-10-CM | POA: Diagnosis not present

## 2015-09-14 DIAGNOSIS — E782 Mixed hyperlipidemia: Secondary | ICD-10-CM | POA: Diagnosis not present

## 2015-09-14 DIAGNOSIS — I10 Essential (primary) hypertension: Secondary | ICD-10-CM | POA: Diagnosis not present

## 2015-09-14 DIAGNOSIS — E1142 Type 2 diabetes mellitus with diabetic polyneuropathy: Secondary | ICD-10-CM | POA: Diagnosis not present

## 2015-09-14 DIAGNOSIS — Z23 Encounter for immunization: Secondary | ICD-10-CM | POA: Diagnosis not present

## 2015-09-14 DIAGNOSIS — N182 Chronic kidney disease, stage 2 (mild): Secondary | ICD-10-CM | POA: Diagnosis not present

## 2015-09-14 DIAGNOSIS — D631 Anemia in chronic kidney disease: Secondary | ICD-10-CM | POA: Diagnosis not present

## 2015-09-26 DIAGNOSIS — D3131 Benign neoplasm of right choroid: Secondary | ICD-10-CM | POA: Diagnosis not present

## 2015-09-26 DIAGNOSIS — E113593 Type 2 diabetes mellitus with proliferative diabetic retinopathy without macular edema, bilateral: Secondary | ICD-10-CM | POA: Diagnosis not present

## 2015-09-26 DIAGNOSIS — Z961 Presence of intraocular lens: Secondary | ICD-10-CM | POA: Insufficient documentation

## 2015-09-26 DIAGNOSIS — H35033 Hypertensive retinopathy, bilateral: Secondary | ICD-10-CM | POA: Insufficient documentation

## 2015-12-12 DIAGNOSIS — D631 Anemia in chronic kidney disease: Secondary | ICD-10-CM | POA: Diagnosis not present

## 2015-12-12 DIAGNOSIS — E113393 Type 2 diabetes mellitus with moderate nonproliferative diabetic retinopathy without macular edema, bilateral: Secondary | ICD-10-CM | POA: Diagnosis not present

## 2015-12-12 DIAGNOSIS — E782 Mixed hyperlipidemia: Secondary | ICD-10-CM | POA: Diagnosis not present

## 2015-12-13 ENCOUNTER — Ambulatory Visit: Payer: Medicare Other | Admitting: Cardiology

## 2015-12-15 DIAGNOSIS — E559 Vitamin D deficiency, unspecified: Secondary | ICD-10-CM | POA: Diagnosis not present

## 2015-12-15 DIAGNOSIS — E113393 Type 2 diabetes mellitus with moderate nonproliferative diabetic retinopathy without macular edema, bilateral: Secondary | ICD-10-CM | POA: Diagnosis not present

## 2015-12-15 DIAGNOSIS — I10 Essential (primary) hypertension: Secondary | ICD-10-CM | POA: Diagnosis not present

## 2015-12-15 DIAGNOSIS — E782 Mixed hyperlipidemia: Secondary | ICD-10-CM | POA: Diagnosis not present

## 2015-12-15 DIAGNOSIS — E1142 Type 2 diabetes mellitus with diabetic polyneuropathy: Secondary | ICD-10-CM | POA: Diagnosis not present

## 2015-12-15 DIAGNOSIS — D631 Anemia in chronic kidney disease: Secondary | ICD-10-CM | POA: Diagnosis not present

## 2015-12-15 DIAGNOSIS — N182 Chronic kidney disease, stage 2 (mild): Secondary | ICD-10-CM | POA: Diagnosis not present

## 2015-12-18 ENCOUNTER — Ambulatory Visit (INDEPENDENT_AMBULATORY_CARE_PROVIDER_SITE_OTHER): Payer: Medicare Other | Admitting: Cardiology

## 2015-12-18 ENCOUNTER — Encounter: Payer: Self-pay | Admitting: Cardiology

## 2015-12-18 VITALS — BP 140/60 | HR 58 | Ht 64.0 in | Wt 223.1 lb

## 2015-12-18 DIAGNOSIS — I5032 Chronic diastolic (congestive) heart failure: Secondary | ICD-10-CM | POA: Diagnosis not present

## 2015-12-18 DIAGNOSIS — I119 Hypertensive heart disease without heart failure: Secondary | ICD-10-CM

## 2015-12-18 MED ORDER — FUROSEMIDE 80 MG PO TABS: 80.0000 mg | ORAL_TABLET | Freq: Every day | ORAL | Status: DC

## 2015-12-18 MED ORDER — GABAPENTIN 300 MG PO CAPS: ORAL_CAPSULE | ORAL | Status: DC

## 2015-12-18 NOTE — Patient Instructions (Signed)
Medication Instructions:  INCREASE YOU GABAPENTIN TO 300 MG 2 AT BEDTIME  Labwork: NONE  Testing/Procedures: NONE  Follow-Up: Your physician wants you to follow-up in: [place] will receive a reminder letter in the mail two months in advance. If you don't receive a letter, please call our office to schedule the follow-up appointment.  If you need a refill on your cardiac medications before your next appointment, please call your pharmacy.

## 2015-12-18 NOTE — Progress Notes (Signed)
Cardiology Office Note   Date:  12/18/2015   ID:  [name], DOB 1931-06-05, MRN [id number]  PCP:  Limmie Patricia, MD  Cardiologist: Darlin Coco MD  No chief complaint on file.     History of Present Illness: [name] is a 82 y.o. female who presents for a six-month follow-up visit   She has a past history of diastolic congestive heart failure. Her last echocardiogram on 02/10/12 showed an ejection fraction of 60% with normal systolic function and grade 1 diastolic dysfunction. She has a past history of pericardial effusion with pericardial tamponade and in 2010 and underwen a pericardial window with good results. she has a history of mild aortic stenosis. Since we last saw her she underwent left hip replacement by Dr. Mayer Camel and has done well.  Her last echocardiogram was 02/10/12 and showed an ejection fraction of 60%. Since last visit she has been feeling well. Since last visit her husband has been diagnosed with early Alzheimer's but fortunately he seems to be responding nicely to Aricept.Her last visit we gave her a trial off gabapentin for peripheral neuropathy.Initially she did not think that it helped but now she thinks that it has helped and she would like to try a stronger dose.  We will increase the dose to 600 mg each evening. She reports that she is only taking 80 mg of Lasix daily and this has been sufficient to keep her edema at a minimum. He has not been having any recent chest pain.  She is not having any increase in peripheral edema on the lower dose of Lasix.  Her breathing is improved. Past Medical History  Diagnosis Date  . Diabetes mellitus     insulin dependent  . Dyspnea   . Anasarca 04/2009    found to be secondary to pericardial effusion with tamponade/pericardial window done  . Hypertension   . Coronary artery disease   . Lumbar scoliosis   . Spondylolisthesis   . Spondylosis   . Lumbar radiculopathy   . Morbid obesity ([place])   .  Pericardial effusion  04/2009     with tamponade  . Diastolic congestive heart failure (HCC)      Chronic diastolic congestive heart failure  . Chronic kidney disease   . Herpes labialis     Healing herpes labialis  . Shortness of breath  04/20/2009  . Hypercholesterolemia   . PONV (postoperative nausea and vomiting)     has not had any problems since 2001  . Bladder infection     pt has been on antibiotic .for this  this week .     Past Surgical History  Procedure Laterality Date  . Subxyphoid pericardial window  04/2009    for pericardial effusion w/pericardial tamponade/sac drained w/20-French Baard drain/transesophageal echocardiogram confimed complete evacution of pericardial fluid  . Lumbar laminectomy    . Cardiac catheterization  04/25/2003    L heart cath w/coronary angiography/R femoral artery/ EF 65%/no mitral regurgitation/ angiography L main coronary artery smooth & normal  . Vaginal hysterectomy    . Breast biopsy      left  . Back surgery      microdiskectomy L3-4 on L/partial facetectomy 3-4 on L/removal of synovial cyst on left L3-4  . Eye surgery      ophthalmoscopy/peritomy adjacent to the limbus from the 8 to 2:30 position superiorly  . Total hip arthroplasty  03/06/2012    Procedure: TOTAL HIP ARTHROPLASTY;  Surgeon: Kerin Salen,  MD;  Location: [facility];  Service: Orthopedics;  Laterality: Left;  DEPUY/PINNACLE, SUMMIT STEM, CUP POLY & CERAMIC     Current Outpatient Prescriptions  Medication Sig Dispense Refill  . albuterol (PROVENTIL HFA;VENTOLIN HFA) 108 (90 BASE) MCG/ACT inhaler Inhale 2 puffs into the lungs every 4 (four) hours as needed for wheezing or shortness of breath. 1 Inhaler 0  . amLODipine (NORVASC) 10 MG tablet Take 5 mg by mouth daily.     Marland Kitchen atorvastatin (LIPITOR) 80 MG tablet Take 40 mg by mouth every evening.    . ergocalciferol (VITAMIN D2) 50000 UNITS capsule Take 50,000 Units by mouth once a week. On friday    . furosemide (LASIX) 80 MG  tablet Take 1 tablet (80 mg total) by mouth daily. 30 tablet 5  . gabapentin (NEURONTIN) 300 MG capsule 2 TABLETS BY MOUTH AT BEDTIME 60 capsule 5  . GLIPIZIDE XL 5 MG 24 hr tablet Take 5 mg by mouth Daily.    Marland Kitchen guaiFENesin (ROBITUSSIN) 100 MG/5ML liquid Take 5-10 mLs (100-200 mg total) by mouth every 4 (four) hours as needed for cough. 60 mL 0  . insulin aspart (NOVOLOG) 100 UNIT/ML injection Inject into the skin as directed. Based on a sliding scale. Inject 3-4 units into the skin daily with breakfast, inject 3-4 units into the skin with lunch. Inject 8 units into the skin daily with dinner.    . insulin glargine (LANTUS) 100 UNIT/ML injection Inject 18 Units into the skin at bedtime.     . IRON PO Take 1 tablet by mouth daily.     . metoprolol tartrate (LOPRESSOR) 25 MG tablet Take 25 mg by mouth 2 (two) times daily.    . pioglitazone (ACTOS) 15 MG tablet Take 15 mg by mouth daily.    . valsartan (DIOVAN) 320 MG tablet Take 320 mg by mouth daily.      No current facility-administered medications for this visit.    Allergies:   Codeine; Lactose intolerance (gi); Lodine; Percocet; Sulfa drugs cross reactors; and Sulfamethoxazole    Social History:  The patient  reports that she has never smoked. She does not have any smokeless tobacco history on file. She reports that she does not drink alcohol or use illicit drugs.   Family History:  The patient's family history includes Angina in her mother; Heart attack in her father; Hypertension in her father; Stroke in her mother.    ROS:  Please see the history of present illness.   Otherwise, review of systems are positive for none.   All other systems are reviewed and negative.    PHYSICAL EXAM: VS:  BP 140/60 mmHg  Pulse 58  Ht 5\' 4"  (1.626 m)  Wt 223 lb 1.9 oz (101.207 kg)  BMI 38.28 kg/m2 , BMI Body mass index is 38.28 kg/(m^2). GEN: Well nourished, well developed, in no acute distress HEENT: normal Neck: no JVD, carotid bruits, or  masses Cardiac: RRR; no murmurs, rubs, or gallops, trace edema  Respiratory:  clear to auscultation bilaterally, normal work of breathing GI: soft, nontender, nondistended, + BS MS: no deformity or atrophy Skin: warm and dry, no rash Neuro:  Strength and sensation are intact Psych: euthymic mood, full affect   EKG:  EKG is ordered today. The ekg ordered today demonstrates sinus bradycardia.  Left axis deviation.  Since last tracing of 08/14/15, no significant change.   Recent Labs: 08/14/2015: ALT 13*; B Natriuretic Peptide 213.2*; BUN 25*; Creatinine, Ser 1.33*; Hemoglobin 11.3*; Platelets 211;  Potassium 4.1; Sodium 144    Lipid Panel    Component Value Date/Time   CHOL  04/21/2009 0620    85        ATP III CLASSIFICATION:  <200     mg/dL   Desirable  200-239  mg/dL   Borderline High  >=240    mg/dL   High          TRIG 93 04/21/2009 0620   HDL 27* 04/21/2009 0620   CHOLHDL 3.1 04/21/2009 0620   VLDL 19 04/21/2009 0620   LDLCALC  04/21/2009 0620    39        Total Cholesterol/HDL:CHD Risk Coronary Heart Disease Risk Table                     Men   Women  1/2 Average Risk   3.4   3.3  Average Risk       5.0   4.4  2 X Average Risk   9.6   7.1  3 X Average Risk  23.4   11.0        Use the calculated Patient Ratio above and the CHD Risk Table to determine the patient's CHD Risk.        ATP III CLASSIFICATION (LDL):  <100     mg/dL   Optimal  100-129  mg/dL   Near or Above                    Optimal  130-159  mg/dL   Borderline  160-189  mg/dL   High  >190     mg/dL   Very High      Wt Readings from Last 3 Encounters:  12/18/15 223 lb 1.9 oz (101.207 kg)  12/06/14 220 lb (99.791 kg)  06/07/14 222 lb (100.699 kg)       ASSESSMENT AND PLAN:  1.  Chronic diastolic heart failure, table on current Lasix 80 mg daily 2. status post pericardial window for pericardial effusion with pericardial tamponade in 2010 3. diabetes mellitus with diabetic neuropathy 4.  essential hypertension, stable   Current medicines are reviewed at length with the patient today.  The patient does not have concerns regarding medicines.  The following changes have been made:  At her request we are going to increase her gabapentin up to 600 mg daily in the evening  Labs/ tests ordered today include:   Orders Placed This Encounter  Procedures  . EKG 12-Lead   Disposition: Continue other medicines as same.  We refilled her gabapentin and her furosemide today.  She will continue close follow-up with her PCP.  She will return to see Dr. Mertie Moores in 6 months for cardiology follow-up  Signed, Darlin Coco MD 12/18/2015 5:30 PM    [address] [place], [place], [place]  [address] Phone: [phone]; Fax: [phone]

## 2016-03-19 DIAGNOSIS — D631 Anemia in chronic kidney disease: Secondary | ICD-10-CM | POA: Diagnosis not present

## 2016-03-19 DIAGNOSIS — E113393 Type 2 diabetes mellitus with moderate nonproliferative diabetic retinopathy without macular edema, bilateral: Secondary | ICD-10-CM | POA: Diagnosis not present

## 2016-03-19 DIAGNOSIS — E782 Mixed hyperlipidemia: Secondary | ICD-10-CM | POA: Diagnosis not present

## 2016-03-22 DIAGNOSIS — D631 Anemia in chronic kidney disease: Secondary | ICD-10-CM | POA: Diagnosis not present

## 2016-03-22 DIAGNOSIS — E559 Vitamin D deficiency, unspecified: Secondary | ICD-10-CM | POA: Diagnosis not present

## 2016-03-22 DIAGNOSIS — E113393 Type 2 diabetes mellitus with moderate nonproliferative diabetic retinopathy without macular edema, bilateral: Secondary | ICD-10-CM | POA: Diagnosis not present

## 2016-03-22 DIAGNOSIS — N182 Chronic kidney disease, stage 2 (mild): Secondary | ICD-10-CM | POA: Diagnosis not present

## 2016-03-22 DIAGNOSIS — I10 Essential (primary) hypertension: Secondary | ICD-10-CM | POA: Diagnosis not present

## 2016-03-22 DIAGNOSIS — E1142 Type 2 diabetes mellitus with diabetic polyneuropathy: Secondary | ICD-10-CM | POA: Diagnosis not present

## 2016-03-22 DIAGNOSIS — E782 Mixed hyperlipidemia: Secondary | ICD-10-CM | POA: Diagnosis not present

## 2016-05-14 DIAGNOSIS — Z961 Presence of intraocular lens: Secondary | ICD-10-CM | POA: Diagnosis not present

## 2016-05-14 DIAGNOSIS — Z0100 Encounter for examination of eyes and vision without abnormal findings: Secondary | ICD-10-CM | POA: Diagnosis not present

## 2016-05-14 DIAGNOSIS — E113593 Type 2 diabetes mellitus with proliferative diabetic retinopathy without macular edema, bilateral: Secondary | ICD-10-CM | POA: Diagnosis not present

## 2016-06-18 DIAGNOSIS — D631 Anemia in chronic kidney disease: Secondary | ICD-10-CM | POA: Diagnosis not present

## 2016-06-18 DIAGNOSIS — E113393 Type 2 diabetes mellitus with moderate nonproliferative diabetic retinopathy without macular edema, bilateral: Secondary | ICD-10-CM | POA: Diagnosis not present

## 2016-06-18 DIAGNOSIS — E782 Mixed hyperlipidemia: Secondary | ICD-10-CM | POA: Diagnosis not present

## 2016-06-18 DIAGNOSIS — E559 Vitamin D deficiency, unspecified: Secondary | ICD-10-CM | POA: Diagnosis not present

## 2016-06-20 ENCOUNTER — Encounter: Payer: Self-pay | Admitting: Cardiovascular Disease

## 2016-06-21 ENCOUNTER — Ambulatory Visit: Payer: Medicare Other | Admitting: Cardiovascular Disease

## 2016-06-21 DIAGNOSIS — I10 Essential (primary) hypertension: Secondary | ICD-10-CM | POA: Diagnosis not present

## 2016-06-21 DIAGNOSIS — E113393 Type 2 diabetes mellitus with moderate nonproliferative diabetic retinopathy without macular edema, bilateral: Secondary | ICD-10-CM | POA: Diagnosis not present

## 2016-06-21 DIAGNOSIS — D631 Anemia in chronic kidney disease: Secondary | ICD-10-CM | POA: Diagnosis not present

## 2016-06-21 DIAGNOSIS — N182 Chronic kidney disease, stage 2 (mild): Secondary | ICD-10-CM | POA: Diagnosis not present

## 2016-06-21 DIAGNOSIS — E782 Mixed hyperlipidemia: Secondary | ICD-10-CM | POA: Diagnosis not present

## 2016-06-21 DIAGNOSIS — E559 Vitamin D deficiency, unspecified: Secondary | ICD-10-CM | POA: Diagnosis not present

## 2016-06-21 DIAGNOSIS — E1142 Type 2 diabetes mellitus with diabetic polyneuropathy: Secondary | ICD-10-CM | POA: Diagnosis not present

## 2016-07-16 DIAGNOSIS — N3946 Mixed incontinence: Secondary | ICD-10-CM | POA: Diagnosis not present

## 2016-07-18 ENCOUNTER — Ambulatory Visit: Payer: Medicare Other | Admitting: Cardiovascular Disease

## 2016-08-06 ENCOUNTER — Other Ambulatory Visit: Payer: Self-pay

## 2016-08-06 MED ORDER — GABAPENTIN 300 MG PO CAPS: ORAL_CAPSULE | ORAL | 1 refills | Status: DC

## 2016-08-21 ENCOUNTER — Ambulatory Visit (INDEPENDENT_AMBULATORY_CARE_PROVIDER_SITE_OTHER): Payer: Medicare Other | Admitting: Cardiovascular Disease

## 2016-08-21 ENCOUNTER — Encounter: Payer: Self-pay | Admitting: Cardiovascular Disease

## 2016-08-21 VITALS — BP 130/74 | HR 60 | Ht 64.0 in | Wt 224.0 lb

## 2016-08-21 DIAGNOSIS — E785 Hyperlipidemia, unspecified: Secondary | ICD-10-CM | POA: Diagnosis not present

## 2016-08-21 DIAGNOSIS — I10 Essential (primary) hypertension: Secondary | ICD-10-CM

## 2016-08-21 DIAGNOSIS — I5032 Chronic diastolic (congestive) heart failure: Secondary | ICD-10-CM | POA: Diagnosis not present

## 2016-08-21 DIAGNOSIS — Z23 Encounter for immunization: Secondary | ICD-10-CM

## 2016-08-21 NOTE — Progress Notes (Signed)
Cardiology Office Note   Date:  08/21/2016   ID:  [name], DOB 04-08-1931, MRN [id number]  PCP:  Limmie Patricia, MD  Cardiologist: Darlin Coco MD, now [place]  Chief Complaint  Patient presents with  . Follow-up    Diastolic CHF    Problem list 1. Chronic diastolic congestive heart failure 2. History of pericardial effusion-status post pericardial window 3.  Aortic stenosis 4. Diabetes mellitus 5. Coronary artery disease 6. Essential hypertension 7. Hyperlipidemia   Notes from [place] - Feb. 2017:  [name] is a 82 y.o. female who presents for a six-month follow-up visit   She has a past history of diastolic congestive heart failure. Her last echocardiogram on 02/10/12 showed an ejection fraction of 60% with normal systolic function and grade 1 diastolic dysfunction. She has a past history of pericardial effusion with pericardial tamponade and in 2010 and underwen a pericardial window with good results. she has a history of mild aortic stenosis. Since we last saw her she underwent left hip replacement by Dr. Mayer Camel and has done well.  Her last echocardiogram was 02/10/12 and showed an ejection fraction of 60%. Since last visit she has been feeling well. Since last visit her husband has been diagnosed with early Alzheimer's but fortunately he seems to be responding nicely to Aricept.Her last visit we gave her a trial off gabapentin for peripheral neuropathy.Initially she did not think that it helped but now she thinks that it has helped and she would like to try a stronger dose.  We will increase the dose to 600 mg each evening. She reports that she is only taking 80 mg of Lasix daily and this has been sufficient to keep her edema at a minimum. He has not been having any recent chest pain.  She is not having any increase in peripheral edema on the lower dose of Lasix.  Her breathing is improved.  Oct. 18, 2017: Still some dyspnea. Does not get any regular   exercise Lots of back and leg pain    Past Medical History:  Diagnosis Date  . Anasarca 04/2009   found to be secondary to pericardial effusion with tamponade/pericardial window done  . Bladder infection    pt has been on antibiotic .for this  this week .   Marland Kitchen Chronic kidney disease   . Coronary artery disease   . Diabetes mellitus    insulin dependent  . Diastolic congestive heart failure (HCC)     Chronic diastolic congestive heart failure  . Dyspnea   . Herpes labialis    Healing herpes labialis  . Hypercholesterolemia   . Hypertension   . Lumbar radiculopathy   . Lumbar scoliosis   . Morbid obesity ([place])   . Pericardial effusion  04/2009    with tamponade  . PONV (postoperative nausea and vomiting)    has not had any problems since 2001  . Shortness of breath  04/20/2009  . Spondylolisthesis   . Spondylosis     Past Surgical History:  Procedure Laterality Date  . BACK SURGERY     microdiskectomy L3-4 on L/partial facetectomy 3-4 on L/removal of synovial cyst on left L3-4  . BREAST BIOPSY     left  . CARDIAC CATHETERIZATION  04/25/2003   L heart cath w/coronary angiography/R femoral artery/ EF 65%/no mitral regurgitation/ angiography L main coronary artery smooth & normal  . EYE SURGERY     ophthalmoscopy/peritomy adjacent to the limbus from the 8 to 2:30  position superiorly  . lumbar laminectomy    . SUBXYPHOID PERICARDIAL WINDOW  04/2009   for pericardial effusion w/pericardial tamponade/sac drained w/20-French Baard drain/transesophageal echocardiogram confimed complete evacution of pericardial fluid  . TOTAL HIP ARTHROPLASTY  03/06/2012   Procedure: TOTAL HIP ARTHROPLASTY;  Surgeon: Kerin Salen, MD;  Location: [facility];  Service: Orthopedics;  Laterality: Left;  DEPUY/PINNACLE, SUMMIT STEM, CUP POLY & CERAMIC  . VAGINAL HYSTERECTOMY       Current Outpatient Prescriptions  Medication Sig Dispense Refill  . amLODipine (NORVASC) 10 MG tablet Take 5 mg by mouth  daily.     Marland Kitchen atorvastatin (LIPITOR) 80 MG tablet Take 40 mg by mouth every evening.    . ergocalciferol (VITAMIN D2) 50000 UNITS capsule Take 50,000 Units by mouth once a week. On friday    . furosemide (LASIX) 80 MG tablet Take 1 tablet (80 mg total) by mouth daily. 30 tablet 5  . gabapentin (NEURONTIN) 300 MG capsule 2 TABLETS BY MOUTH AT BEDTIME 60 capsule 1  . GLIPIZIDE XL 5 MG 24 hr tablet Take 5 mg by mouth Daily.    Marland Kitchen guaiFENesin (ROBITUSSIN) 100 MG/5ML liquid Take 5-10 mLs (100-200 mg total) by mouth every 4 (four) hours as needed for cough. 60 mL 0  . insulin aspart (NOVOLOG) 100 UNIT/ML injection Inject into the skin as directed. Based on a sliding scale. Inject 3-4 units into the skin daily with breakfast, inject 3-4 units into the skin with lunch. Inject 8 units into the skin daily with dinner.    . insulin glargine (LANTUS) 100 UNIT/ML injection Inject 18 Units into the skin at bedtime.     . IRON PO Take 1 tablet by mouth daily.     . metoprolol tartrate (LOPRESSOR) 25 MG tablet Take 25 mg by mouth 2 (two) times daily.    . pioglitazone (ACTOS) 15 MG tablet Take 15 mg by mouth daily.    . valsartan (DIOVAN) 320 MG tablet Take 320 mg by mouth daily.     Marland Kitchen albuterol (PROVENTIL HFA;VENTOLIN HFA) 108 (90 BASE) MCG/ACT inhaler Inhale 2 puffs into the lungs every 4 (four) hours as needed for wheezing or shortness of breath. (Patient not taking: Reported on 08/21/2016) 1 Inhaler 0   No current facility-administered medications for this visit.     Allergies:   Codeine; Lactose intolerance (gi); Lodine [etodolac]; Percocet [oxycodone-acetaminophen]; Sulfa drugs cross reactors; and Sulfamethoxazole    Social History:  The patient  reports that she has never smoked. She does not have any smokeless tobacco history on file. She reports that she does not drink alcohol or use drugs.   Family History:  The patient's family history includes Angina in her mother; Heart attack in her father;  Hypertension in her father; Stroke in her mother.    ROS:  Please see the history of present illness.   Otherwise, review of systems are positive for none.   All other systems are reviewed and negative.    PHYSICAL EXAM: VS:  BP 130/74 (BP Location: Right Arm, Patient Position: Sitting, Cuff Size: Large)   Pulse 60   Ht 5\' 4"  (1.626 m)   Wt 224 lb (101.6 kg)   BMI 38.45 kg/m  , BMI Body mass index is 38.45 kg/m. GEN: Well nourished, well developed, in no acute distress  HEENT: normal  Neck: no JVD, carotid bruits, or masses Cardiac: RRR; no murmurs, rubs, or gallops, 1-2 + edema  Respiratory:  clear to auscultation bilaterally, normal  work of breathing GI: soft, nontender, nondistended, + BS MS: no deformity or atrophy  Skin: warm and dry, no rash Neuro:  Strength and sensation are intact Psych: euthymic mood, full affect   EKG:  EKG is ordered today. The ekg ordered today demonstrates sinus bradycardia.  Left axis deviation.  Since last tracing of 08/14/15, no significant change.   Recent Labs: No results found for requested labs within last 8760 hours.    Lipid Panel    Component Value Date/Time   CHOL  04/21/2009 0620    85        ATP III CLASSIFICATION:  <200     mg/dL   Desirable  200-239  mg/dL   Borderline High  >=240    mg/dL   High          TRIG 93 04/21/2009 0620   HDL 27 (L) 04/21/2009 0620   CHOLHDL 3.1 04/21/2009 0620   VLDL 19 04/21/2009 0620   LDLCALC  04/21/2009 0620    39        Total Cholesterol/HDL:CHD Risk Coronary Heart Disease Risk Table                     Men   Women  1/2 Average Risk   3.4   3.3  Average Risk       5.0   4.4  2 X Average Risk   9.6   7.1  3 X Average Risk  23.4   11.0        Use the calculated Patient Ratio above and the CHD Risk Table to determine the patient's CHD Risk.        ATP III CLASSIFICATION (LDL):  <100     mg/dL   Optimal  100-129  mg/dL   Near or Above                    Optimal  130-159  mg/dL    Borderline  160-189  mg/dL   High  >190     mg/dL   Very High      Wt Readings from Last 3 Encounters:  08/21/16 224 lb (101.6 kg)  12/18/15 223 lb 1.9 oz (101.2 kg)  12/06/14 220 lb (99.8 kg)       ASSESSMENT AND PLAN:  1.  Chronic diastolic heart failure, table on current Lasix 80 mg daily Continue current meds.  She needs to elevate her legs higher than her heart. She has not been elevating her legs enough. We discussed increasing her  exercise.  2. status post pericardial window for pericardial effusion with pericardial tamponade in 2010  3. diabetes mellitus with diabetic neuropathy  4. essential hypertension, BP is well controlled  Continue same medications. I've advised her to decrease intake of salt and pre-prepared foods. She seems to eat a lot of prepared foods and salty foods. I've advised her to elevate her legs higher than her heart. She typically has not been elevating her legs and off.  Current medicines are reviewed at length with the patient today.  The patient does not have concerns regarding medicines.  The following changes have been made:  At her request we are going to increase her gabapentin up to 600 mg daily in the evening  Labs/ tests ordered today include:   Orders Placed This Encounter  Procedures  . Flu Vaccine QUAD 36+ mos IM

## 2016-08-21 NOTE — Patient Instructions (Signed)

## 2016-09-09 ENCOUNTER — Other Ambulatory Visit: Payer: Self-pay

## 2016-09-09 MED ORDER — FUROSEMIDE 80 MG PO TABS: 80.0000 mg | ORAL_TABLET | Freq: Every day | ORAL | 5 refills | Status: DC

## 2016-09-17 DIAGNOSIS — E113393 Type 2 diabetes mellitus with moderate nonproliferative diabetic retinopathy without macular edema, bilateral: Secondary | ICD-10-CM | POA: Diagnosis not present

## 2016-09-17 DIAGNOSIS — E559 Vitamin D deficiency, unspecified: Secondary | ICD-10-CM | POA: Diagnosis not present

## 2016-09-17 DIAGNOSIS — E782 Mixed hyperlipidemia: Secondary | ICD-10-CM | POA: Diagnosis not present

## 2016-09-17 DIAGNOSIS — D631 Anemia in chronic kidney disease: Secondary | ICD-10-CM | POA: Diagnosis not present

## 2016-09-19 DIAGNOSIS — E782 Mixed hyperlipidemia: Secondary | ICD-10-CM | POA: Diagnosis not present

## 2016-09-19 DIAGNOSIS — E559 Vitamin D deficiency, unspecified: Secondary | ICD-10-CM | POA: Diagnosis not present

## 2016-09-19 DIAGNOSIS — I10 Essential (primary) hypertension: Secondary | ICD-10-CM | POA: Diagnosis not present

## 2016-09-19 DIAGNOSIS — D631 Anemia in chronic kidney disease: Secondary | ICD-10-CM | POA: Diagnosis not present

## 2016-09-19 DIAGNOSIS — E113393 Type 2 diabetes mellitus with moderate nonproliferative diabetic retinopathy without macular edema, bilateral: Secondary | ICD-10-CM | POA: Diagnosis not present

## 2016-09-19 DIAGNOSIS — N182 Chronic kidney disease, stage 2 (mild): Secondary | ICD-10-CM | POA: Diagnosis not present

## 2016-09-19 DIAGNOSIS — E1142 Type 2 diabetes mellitus with diabetic polyneuropathy: Secondary | ICD-10-CM | POA: Diagnosis not present

## 2016-10-05 ENCOUNTER — Inpatient Hospital Stay (HOSPITAL_COMMUNITY)
Admission: EM | Admit: 2016-10-05 | Discharge: 2016-10-15 | DRG: 190 | Disposition: A | Discharge status: Home Health | Payer: Medicare Other | Attending: Internal Medicine | Admitting: Internal Medicine

## 2016-10-05 ENCOUNTER — Encounter (HOSPITAL_COMMUNITY): Payer: Self-pay | Admitting: Emergency Medicine

## 2016-10-05 ENCOUNTER — Emergency Department (HOSPITAL_COMMUNITY): Payer: Medicare Other

## 2016-10-05 DIAGNOSIS — R8271 Bacteriuria: Secondary | ICD-10-CM | POA: Diagnosis present

## 2016-10-05 DIAGNOSIS — Z7982 Long term (current) use of aspirin: Secondary | ICD-10-CM

## 2016-10-05 DIAGNOSIS — Z8249 Family history of ischemic heart disease and other diseases of the circulatory system: Secondary | ICD-10-CM

## 2016-10-05 DIAGNOSIS — N170 Acute kidney failure with tubular necrosis: Secondary | ICD-10-CM | POA: Diagnosis not present

## 2016-10-05 DIAGNOSIS — R34 Anuria and oliguria: Secondary | ICD-10-CM | POA: Diagnosis present

## 2016-10-05 DIAGNOSIS — N179 Acute kidney failure, unspecified: Secondary | ICD-10-CM

## 2016-10-05 DIAGNOSIS — J9601 Acute respiratory failure with hypoxia: Secondary | ICD-10-CM | POA: Diagnosis present

## 2016-10-05 DIAGNOSIS — E7800 Pure hypercholesterolemia, unspecified: Secondary | ICD-10-CM | POA: Diagnosis present

## 2016-10-05 DIAGNOSIS — E785 Hyperlipidemia, unspecified: Secondary | ICD-10-CM | POA: Diagnosis not present

## 2016-10-05 DIAGNOSIS — Z6837 Body mass index (BMI) 37.0-37.9, adult: Secondary | ICD-10-CM

## 2016-10-05 DIAGNOSIS — G9341 Metabolic encephalopathy: Secondary | ICD-10-CM | POA: Diagnosis not present

## 2016-10-05 DIAGNOSIS — Z823 Family history of stroke: Secondary | ICD-10-CM

## 2016-10-05 DIAGNOSIS — N183 Chronic kidney disease, stage 3 unspecified: Secondary | ICD-10-CM

## 2016-10-05 DIAGNOSIS — B9629 Other Escherichia coli [E. coli] as the cause of diseases classified elsewhere: Secondary | ICD-10-CM

## 2016-10-05 DIAGNOSIS — E782 Mixed hyperlipidemia: Secondary | ICD-10-CM | POA: Diagnosis present

## 2016-10-05 DIAGNOSIS — Z7722 Contact with and (suspected) exposure to environmental tobacco smoke (acute) (chronic): Secondary | ICD-10-CM | POA: Diagnosis present

## 2016-10-05 DIAGNOSIS — G9340 Encephalopathy, unspecified: Secondary | ICD-10-CM

## 2016-10-05 DIAGNOSIS — J9690 Respiratory failure, unspecified, unspecified whether with hypoxia or hypercapnia: Secondary | ICD-10-CM

## 2016-10-05 DIAGNOSIS — J9622 Acute and chronic respiratory failure with hypercapnia: Secondary | ICD-10-CM

## 2016-10-05 DIAGNOSIS — R0602 Shortness of breath: Secondary | ICD-10-CM | POA: Diagnosis not present

## 2016-10-05 DIAGNOSIS — I272 Pulmonary hypertension, unspecified: Secondary | ICD-10-CM | POA: Diagnosis present

## 2016-10-05 DIAGNOSIS — J9801 Acute bronchospasm: Secondary | ICD-10-CM | POA: Diagnosis present

## 2016-10-05 DIAGNOSIS — I10 Essential (primary) hypertension: Secondary | ICD-10-CM

## 2016-10-05 DIAGNOSIS — R2681 Unsteadiness on feet: Secondary | ICD-10-CM

## 2016-10-05 DIAGNOSIS — E1165 Type 2 diabetes mellitus with hyperglycemia: Secondary | ICD-10-CM | POA: Diagnosis not present

## 2016-10-05 DIAGNOSIS — I130 Hypertensive heart and chronic kidney disease with heart failure and stage 1 through stage 4 chronic kidney disease, or unspecified chronic kidney disease: Secondary | ICD-10-CM | POA: Diagnosis present

## 2016-10-05 DIAGNOSIS — E119 Type 2 diabetes mellitus without complications: Secondary | ICD-10-CM | POA: Diagnosis not present

## 2016-10-05 DIAGNOSIS — T508X5A Adverse effect of diagnostic agents, initial encounter: Secondary | ICD-10-CM | POA: Diagnosis present

## 2016-10-05 DIAGNOSIS — E1122 Type 2 diabetes mellitus with diabetic chronic kidney disease: Secondary | ICD-10-CM | POA: Diagnosis present

## 2016-10-05 DIAGNOSIS — R0902 Hypoxemia: Secondary | ICD-10-CM | POA: Diagnosis not present

## 2016-10-05 DIAGNOSIS — R05 Cough: Secondary | ICD-10-CM | POA: Diagnosis not present

## 2016-10-05 DIAGNOSIS — R062 Wheezing: Secondary | ICD-10-CM | POA: Diagnosis not present

## 2016-10-05 DIAGNOSIS — N141 Nephropathy induced by other drugs, medicaments and biological substances: Secondary | ICD-10-CM | POA: Diagnosis present

## 2016-10-05 DIAGNOSIS — E1142 Type 2 diabetes mellitus with diabetic polyneuropathy: Secondary | ICD-10-CM | POA: Diagnosis present

## 2016-10-05 DIAGNOSIS — R0600 Dyspnea, unspecified: Secondary | ICD-10-CM | POA: Diagnosis present

## 2016-10-05 DIAGNOSIS — I2510 Atherosclerotic heart disease of native coronary artery without angina pectoris: Secondary | ICD-10-CM | POA: Diagnosis present

## 2016-10-05 DIAGNOSIS — Z885 Allergy status to narcotic agent status: Secondary | ICD-10-CM

## 2016-10-05 DIAGNOSIS — E739 Lactose intolerance, unspecified: Secondary | ICD-10-CM | POA: Diagnosis present

## 2016-10-05 DIAGNOSIS — T380X5A Adverse effect of glucocorticoids and synthetic analogues, initial encounter: Secondary | ICD-10-CM | POA: Diagnosis not present

## 2016-10-05 DIAGNOSIS — R0603 Acute respiratory distress: Secondary | ICD-10-CM

## 2016-10-05 DIAGNOSIS — J440 Chronic obstructive pulmonary disease with acute lower respiratory infection: Secondary | ICD-10-CM | POA: Diagnosis present

## 2016-10-05 DIAGNOSIS — I5032 Chronic diastolic (congestive) heart failure: Secondary | ICD-10-CM | POA: Diagnosis not present

## 2016-10-05 DIAGNOSIS — E1169 Type 2 diabetes mellitus with other specified complication: Secondary | ICD-10-CM | POA: Diagnosis present

## 2016-10-05 DIAGNOSIS — I5033 Acute on chronic diastolic (congestive) heart failure: Secondary | ICD-10-CM | POA: Diagnosis not present

## 2016-10-05 DIAGNOSIS — E662 Morbid (severe) obesity with alveolar hypoventilation: Secondary | ICD-10-CM | POA: Diagnosis present

## 2016-10-05 DIAGNOSIS — J441 Chronic obstructive pulmonary disease with (acute) exacerbation: Principal | ICD-10-CM | POA: Diagnosis present

## 2016-10-05 DIAGNOSIS — Z886 Allergy status to analgesic agent status: Secondary | ICD-10-CM

## 2016-10-05 DIAGNOSIS — R531 Weakness: Secondary | ICD-10-CM | POA: Diagnosis not present

## 2016-10-05 DIAGNOSIS — J9600 Acute respiratory failure, unspecified whether with hypoxia or hypercapnia: Secondary | ICD-10-CM

## 2016-10-05 DIAGNOSIS — E875 Hyperkalemia: Secondary | ICD-10-CM | POA: Diagnosis present

## 2016-10-05 DIAGNOSIS — N19 Unspecified kidney failure: Secondary | ICD-10-CM

## 2016-10-05 DIAGNOSIS — J208 Acute bronchitis due to other specified organisms: Secondary | ICD-10-CM

## 2016-10-05 DIAGNOSIS — Z794 Long term (current) use of insulin: Secondary | ICD-10-CM

## 2016-10-05 DIAGNOSIS — J9602 Acute respiratory failure with hypercapnia: Secondary | ICD-10-CM

## 2016-10-05 DIAGNOSIS — E118 Type 2 diabetes mellitus with unspecified complications: Secondary | ICD-10-CM

## 2016-10-05 DIAGNOSIS — E872 Acidosis: Secondary | ICD-10-CM | POA: Diagnosis not present

## 2016-10-05 DIAGNOSIS — N390 Urinary tract infection, site not specified: Secondary | ICD-10-CM

## 2016-10-05 DIAGNOSIS — J449 Chronic obstructive pulmonary disease, unspecified: Secondary | ICD-10-CM

## 2016-10-05 DIAGNOSIS — R0789 Other chest pain: Secondary | ICD-10-CM | POA: Diagnosis not present

## 2016-10-05 DIAGNOSIS — Z882 Allergy status to sulfonamides status: Secondary | ICD-10-CM

## 2016-10-05 DIAGNOSIS — K30 Functional dyspepsia: Secondary | ICD-10-CM | POA: Diagnosis not present

## 2016-10-05 DIAGNOSIS — R059 Cough, unspecified: Secondary | ICD-10-CM

## 2016-10-05 DIAGNOSIS — G4733 Obstructive sleep apnea (adult) (pediatric): Secondary | ICD-10-CM | POA: Diagnosis present

## 2016-10-05 DIAGNOSIS — Z888 Allergy status to other drugs, medicaments and biological substances status: Secondary | ICD-10-CM

## 2016-10-05 DIAGNOSIS — Z1612 Extended spectrum beta lactamase (ESBL) resistance: Secondary | ICD-10-CM

## 2016-10-05 HISTORY — DX: Chronic diastolic (congestive) heart failure: I50.32

## 2016-10-05 HISTORY — DX: Chronic kidney disease, stage 3 unspecified: N18.30

## 2016-10-05 HISTORY — DX: Chronic kidney disease, stage 3 (moderate): N18.3

## 2016-10-05 LAB — CBC WITH DIFFERENTIAL/PLATELET
Basophils Absolute: 0 10*3/uL (ref 0.0–0.1)
Basophils Relative: 1 %
Eosinophils Absolute: 0.1 10*3/uL (ref 0.0–0.7)
Eosinophils Relative: 1 %
HCT: 42.7 % (ref 36.0–46.0)
Hemoglobin: 13 g/dL (ref 12.0–15.0)
Lymphocytes Relative: 13 %
Lymphs Abs: 0.8 10*3/uL (ref 0.7–4.0)
MCH: 27.2 pg (ref 26.0–34.0)
MCHC: 30.4 g/dL (ref 30.0–36.0)
MCV: 89.3 fL (ref 78.0–100.0)
Monocytes Absolute: 0.4 10*3/uL (ref 0.1–1.0)
Monocytes Relative: 6 %
Neutro Abs: 4.8 10*3/uL (ref 1.7–7.7)
Neutrophils Relative %: 79 %
Platelets: 185 10*3/uL (ref 150–400)
RBC: 4.78 MIL/uL (ref 3.87–5.11)
RDW: 15.4 % (ref 11.5–15.5)
WBC: 6.1 10*3/uL (ref 4.0–10.5)

## 2016-10-05 LAB — COMPREHENSIVE METABOLIC PANEL
ALT: 17 U/L (ref 14–54)
AST: 22 U/L (ref 15–41)
Albumin: 3.6 g/dL (ref 3.5–5.0)
Alkaline Phosphatase: 78 U/L (ref 38–126)
Anion gap: 8 (ref 5–15)
BUN: 21 mg/dL — ABNORMAL HIGH (ref 6–20)
CO2: 28 mmol/L (ref 22–32)
Calcium: 8.8 mg/dL — ABNORMAL LOW (ref 8.9–10.3)
Chloride: 103 mmol/L (ref 101–111)
Creatinine, Ser: 1.2 mg/dL — ABNORMAL HIGH (ref 0.44–1.00)
GFR calc Af Amer: 47 mL/min — ABNORMAL LOW (ref >60)
GFR calc non Af Amer: 41 mL/min — ABNORMAL LOW (ref >60)
Glucose, Bld: 216 mg/dL — ABNORMAL HIGH (ref 65–99)
Potassium: 4.7 mmol/L (ref 3.5–5.1)
Sodium: 139 mmol/L (ref 135–145)
Total Bilirubin: 0.4 mg/dL (ref 0.3–1.2)
Total Protein: 6.7 g/dL (ref 6.5–8.1)

## 2016-10-05 LAB — GLUCOSE, CAPILLARY
Glucose-Capillary: 204 mg/dL — ABNORMAL HIGH (ref 65–99)
Glucose-Capillary: 369 mg/dL — ABNORMAL HIGH (ref 65–99)

## 2016-10-05 LAB — I-STAT TROPONIN, ED: Troponin i, poc: 0 ng/mL (ref 0.00–0.08)

## 2016-10-05 LAB — BRAIN NATRIURETIC PEPTIDE: B Natriuretic Peptide: 200.6 pg/mL — ABNORMAL HIGH (ref 0.0–100.0)

## 2016-10-05 MED ORDER — GABAPENTIN 300 MG PO CAPS
600.0000 mg | ORAL_CAPSULE | Freq: Every day | ORAL | Status: DC
  Administered 2016-10-05 – 2016-10-14 (×8): 600 mg via ORAL
  Filled 2016-10-05 (×8): qty 2

## 2016-10-05 MED ORDER — ALBUTEROL SULFATE (2.5 MG/3ML) 0.083% IN NEBU
5.0000 mg | INHALATION_SOLUTION | Freq: Once | RESPIRATORY_TRACT | Status: AC
  Administered 2016-10-05: 5 mg via RESPIRATORY_TRACT
  Filled 2016-10-05: qty 6

## 2016-10-05 MED ORDER — IPRATROPIUM-ALBUTEROL 0.5-2.5 (3) MG/3ML IN SOLN
3.0000 mL | RESPIRATORY_TRACT | Status: DC
  Administered 2016-10-05: 3 mL via RESPIRATORY_TRACT

## 2016-10-05 MED ORDER — ONDANSETRON HCL 4 MG PO TABS: 4.0000 mg | ORAL_TABLET | Freq: Four times a day (QID) | ORAL | Status: DC | PRN

## 2016-10-05 MED ORDER — HEPARIN SODIUM (PORCINE) 5000 UNIT/ML IJ SOLN
5000.0000 [IU] | Freq: Three times a day (TID) | INTRAMUSCULAR | Status: DC
  Administered 2016-10-05 – 2016-10-15 (×29): 5000 [IU] via SUBCUTANEOUS
  Filled 2016-10-05 (×25): qty 1

## 2016-10-05 MED ORDER — METOPROLOL TARTRATE 25 MG PO TABS
25.0000 mg | ORAL_TABLET | Freq: Two times a day (BID) | ORAL | Status: DC
  Administered 2016-10-05 – 2016-10-15 (×19): 25 mg via ORAL
  Filled 2016-10-05 (×19): qty 1

## 2016-10-05 MED ORDER — GUAIFENESIN 100 MG/5ML PO SOLN
100.0000 mg | ORAL | Status: DC | PRN
  Administered 2016-10-05 – 2016-10-09 (×5): 200 mg via ORAL
  Filled 2016-10-05 (×6): qty 10

## 2016-10-05 MED ORDER — FUROSEMIDE 20 MG PO TABS: 80.0000 mg | ORAL_TABLET | Freq: Every day | ORAL | Status: DC

## 2016-10-05 MED ORDER — ASPIRIN EC 81 MG PO TBEC
81.0000 mg | DELAYED_RELEASE_TABLET | ORAL | Status: DC
  Administered 2016-10-06 – 2016-10-15 (×10): 81 mg via ORAL
  Filled 2016-10-05 (×10): qty 1

## 2016-10-05 MED ORDER — METHYLPREDNISOLONE SODIUM SUCC 125 MG IJ SOLR
125.0000 mg | Freq: Once | INTRAMUSCULAR | Status: AC
  Administered 2016-10-05: 125 mg via INTRAVENOUS
  Filled 2016-10-05: qty 2

## 2016-10-05 MED ORDER — IPRATROPIUM-ALBUTEROL 0.5-2.5 (3) MG/3ML IN SOLN
3.0000 mL | Freq: Once | RESPIRATORY_TRACT | Status: AC
  Administered 2016-10-05: 3 mL via RESPIRATORY_TRACT
  Filled 2016-10-05: qty 3

## 2016-10-05 MED ORDER — AMLODIPINE BESYLATE 10 MG PO TABS
10.0000 mg | ORAL_TABLET | Freq: Every day | ORAL | Status: DC
  Administered 2016-10-05 – 2016-10-15 (×11): 10 mg via ORAL
  Filled 2016-10-05: qty 1
  Filled 2016-10-05: qty 2
  Filled 2016-10-05: qty 1
  Filled 2016-10-05: qty 2
  Filled 2016-10-05 (×3): qty 1
  Filled 2016-10-05: qty 2
  Filled 2016-10-05 (×2): qty 1

## 2016-10-05 MED ORDER — IOPAMIDOL (ISOVUE-370) INJECTION 76%
INTRAVENOUS | Status: AC
  Administered 2016-10-05: 80 mL
  Filled 2016-10-05: qty 100

## 2016-10-05 MED ORDER — ATORVASTATIN CALCIUM 40 MG PO TABS
40.0000 mg | ORAL_TABLET | Freq: Every evening | ORAL | Status: DC
  Administered 2016-10-05 – 2016-10-14 (×9): 40 mg via ORAL
  Filled 2016-10-05 (×8): qty 1

## 2016-10-05 MED ORDER — IRBESARTAN 300 MG PO TABS
300.0000 mg | ORAL_TABLET | Freq: Every day | ORAL | Status: DC
  Administered 2016-10-06: 300 mg via ORAL
  Filled 2016-10-05 (×2): qty 1
  Filled 2016-10-05: qty 4

## 2016-10-05 MED ORDER — SENNOSIDES-DOCUSATE SODIUM 8.6-50 MG PO TABS
1.0000 | ORAL_TABLET | Freq: Every evening | ORAL | Status: DC | PRN
  Administered 2016-10-13: 1 via ORAL
  Filled 2016-10-05: qty 1

## 2016-10-05 MED ORDER — BISACODYL 10 MG RE SUPP: 10.0000 mg | Freq: Every day | RECTAL | Status: DC | PRN

## 2016-10-05 MED ORDER — METHYLPREDNISOLONE SODIUM SUCC 125 MG IJ SOLR
60.0000 mg | Freq: Two times a day (BID) | INTRAMUSCULAR | Status: DC
  Administered 2016-10-06 – 2016-10-08 (×5): 60 mg via INTRAVENOUS
  Filled 2016-10-05 (×5): qty 2

## 2016-10-05 MED ORDER — ALBUTEROL SULFATE (2.5 MG/3ML) 0.083% IN NEBU
2.5000 mg | INHALATION_SOLUTION | RESPIRATORY_TRACT | Status: DC | PRN
  Administered 2016-10-06: 2.5 mg via RESPIRATORY_TRACT
  Filled 2016-10-05: qty 3

## 2016-10-05 MED ORDER — INSULIN ASPART 100 UNIT/ML ~~LOC~~ SOLN
0.0000 [IU] | Freq: Three times a day (TID) | SUBCUTANEOUS | Status: DC
  Administered 2016-10-05: 3 [IU] via SUBCUTANEOUS
  Administered 2016-10-06: 5 [IU] via SUBCUTANEOUS
  Administered 2016-10-06: 3 [IU] via SUBCUTANEOUS
  Administered 2016-10-07 (×2): 5 [IU] via SUBCUTANEOUS
  Administered 2016-10-07: 3 [IU] via SUBCUTANEOUS
  Administered 2016-10-08 (×2): 2 [IU] via SUBCUTANEOUS
  Administered 2016-10-09: 1 [IU] via SUBCUTANEOUS
  Administered 2016-10-09 (×2): 3 [IU] via SUBCUTANEOUS
  Administered 2016-10-10: 5 [IU] via SUBCUTANEOUS
  Administered 2016-10-10: 3 [IU] via SUBCUTANEOUS

## 2016-10-05 MED ORDER — INSULIN GLARGINE 100 UNIT/ML ~~LOC~~ SOLN
10.0000 [IU] | Freq: Every day | SUBCUTANEOUS | Status: DC
  Administered 2016-10-05 – 2016-10-11 (×7): 10 [IU] via SUBCUTANEOUS
  Filled 2016-10-05 (×9): qty 0.1

## 2016-10-05 MED ORDER — ONDANSETRON HCL 4 MG/2ML IJ SOLN
4.0000 mg | Freq: Four times a day (QID) | INTRAMUSCULAR | Status: DC | PRN
  Administered 2016-10-07: 4 mg via INTRAVENOUS
  Filled 2016-10-05: qty 2

## 2016-10-05 MED ORDER — MAGNESIUM CITRATE PO SOLN: 1.0000 | Freq: Once | ORAL | Status: DC | PRN

## 2016-10-05 NOTE — ED Notes (Signed)
Patient transported to X-ray 

## 2016-10-05 NOTE — ED Triage Notes (Signed)
Pt. Stated, I've been real congested probably bonchitis and I've become nauseated, I have not thrown up but feel like I am. Its making me SOB

## 2016-10-05 NOTE — ED Notes (Signed)
ED Provider at bedside. 

## 2016-10-05 NOTE — ED Notes (Signed)
Patient transported to CT 

## 2016-10-05 NOTE — Progress Notes (Signed)
Called and received report from [place], [place].

## 2016-10-05 NOTE — H&P (Signed)
History and Physical    [name] [id number] DOB: 1931-09-09 DOA: 10/05/2016   PCP: Limmie Patricia, MD   Patient coming from:  Home    Chief Complaint: Cough, shortness of breath, rhinorrhea   HPI: [name] is a 82 y.o. female with a history of the study heart failure, last EF 60% in October 2013, diabetes, hypertension, CAD, security, presenting to the ED today with multiple complaints including cough chest congestion shortness of breath and nasal congestion, without epistaxis or hemoptysis. This began about 5 days ago, and insight accompanied by white sputum. She also endorses chest congestion, and reports possible sick contacts. She has not tried any over-the-counter medication. She has developed wheezing, with shortness of breath prior to presentation. She also reports shortness of breath with exertion. She denies any history of COPD. She does have prior history of bronchitis. She reports that these symptoms feel similar to her prior bronchitis, although worse. She denies any chest pain, nausea or vomiting. She denies any abdominal pain or diarrhea. She denies any lower extremity swelling. She denies any lightheadedness, dizziness or headache. No vision changes. No apology. She denies any urinary symptoms. She denies any history of DVT, hormonal use, recent hospitalizations or surgeries, prolonged immobilization or calf tenderness.     ED Course:  BP 153/57   Pulse 69   Temp 98.2 F (36.8 C) (Oral)   Resp 25   SpO2 97%    CT of the chest negative for PE or pneumonia. BNP 200. Creatinine 1.2 troponin negative ads 0 white count 6.1 platelets 185 hemoglobin 13 glucose 216  Review of Systems: As per HPI otherwise 10 point review of systems negative.   Past Medical History:  Diagnosis Date  . Anasarca 04/2009   found to be secondary to pericardial effusion with tamponade/pericardial window done  . Bladder infection    pt has been on antibiotic .for this  this week .     Marland Kitchen Chronic kidney disease   . Coronary artery disease   . Diabetes mellitus    insulin dependent  . Diastolic congestive heart failure (HCC)     Chronic diastolic congestive heart failure  . Dyspnea   . Herpes labialis    Healing herpes labialis  . Hypercholesterolemia   . Hypertension   . Lumbar radiculopathy   . Lumbar scoliosis   . Morbid obesity ([place])   . Pericardial effusion  04/2009    with tamponade  . PONV (postoperative nausea and vomiting)    has not had any problems since 2001  . Shortness of breath  04/20/2009  . Spondylolisthesis   . Spondylosis     Past Surgical History:  Procedure Laterality Date  . BACK SURGERY     microdiskectomy L3-4 on L/partial facetectomy 3-4 on L/removal of synovial cyst on left L3-4  . BREAST BIOPSY     left  . CARDIAC CATHETERIZATION  04/25/2003   L heart cath w/coronary angiography/R femoral artery/ EF 65%/no mitral regurgitation/ angiography L main coronary artery smooth & normal  . EYE SURGERY     ophthalmoscopy/peritomy adjacent to the limbus from the 8 to 2:30 position superiorly  . lumbar laminectomy    . SUBXYPHOID PERICARDIAL WINDOW  04/2009   for pericardial effusion w/pericardial tamponade/sac drained w/20-French Baard drain/transesophageal echocardiogram confimed complete evacution of pericardial fluid  . TOTAL HIP ARTHROPLASTY  03/06/2012   Procedure: TOTAL HIP ARTHROPLASTY;  Surgeon: Kerin Salen, MD;  Location: [facility];  Service: Orthopedics;  Laterality:  Left;  DEPUY/PINNACLE, SUMMIT STEM, CUP POLY & CERAMIC  . VAGINAL HYSTERECTOMY      Social History Social History   Social History  . Marital status: Married    Spouse name: N/A  . Number of children: N/A  . Years of education: N/A   Occupational History  . Not on file.   Social History Main Topics  . Smoking status: Never Smoker  . Smokeless tobacco: Never Used  . Alcohol use No  . Drug use: No  . Sexual activity: Not on file   Other Topics Concern  .  Not on file   Social History Narrative  . No narrative on file     Allergies  Allergen Reactions  . Codeine Nausea And Vomiting    Hydrocodone is ok  . Lactose Intolerance (Gi) Diarrhea  . Lodine [Etodolac] Other (See Comments)    dizziness  . Percocet [Oxycodone-Acetaminophen] Other (See Comments)    Percocet does not relieve pts' pain.   . Sulfa Drugs Cross Reactors Nausea Only  . Sulfamethoxazole Nausea And Vomiting    unknown    Family History  Problem Relation Age of Onset  . Heart attack Father   . Hypertension Father   . Stroke Mother   . Angina Mother   . Coronary artery disease        Prior to Admission medications   Medication Sig Start Date End Date Taking? Authorizing Provider  amLODipine (NORVASC) 10 MG tablet Take 10 mg by mouth daily.    Yes Historical Provider, MD  aspirin EC 81 MG tablet Take 81 mg by mouth every morning.   Yes Historical Provider, MD  atorvastatin (LIPITOR) 80 MG tablet Take 40 mg by mouth every evening.   Yes Historical Provider, MD  ergocalciferol (VITAMIN D2) 50000 UNITS capsule Take 50,000 Units by mouth once a week. On friday 07/30/11  Yes Historical Provider, MD  furosemide (LASIX) 80 MG tablet Take 1 tablet (80 mg total) by mouth daily. 09/09/16  Yes Thayer Headings, MD  gabapentin (NEURONTIN) 300 MG capsule 2 TABLETS BY MOUTH AT BEDTIME 08/06/16  Yes Thayer Headings, MD  GLIPIZIDE XL 5 MG 24 hr tablet Take 5 mg by mouth Daily. 06/12/12  Yes Historical Provider, MD  guaiFENesin (ROBITUSSIN) 100 MG/5ML liquid Take 5-10 mLs (100-200 mg total) by mouth every 4 (four) hours as needed for cough. 08/14/15  Yes Tatyana Kirichenko, PA-C  insulin aspart (NOVOLOG) 100 UNIT/ML injection Inject into the skin as directed. Based on a sliding scale. Inject 3-4 units into the skin daily with breakfast, inject 3-4 units into the skin with lunch. Inject 8 units into the skin daily with dinner.   Yes Historical Provider, MD  insulin glargine (LANTUS) 100  UNIT/ML injection Inject 20 Units into the skin at bedtime.    Yes Historical Provider, MD  metoprolol tartrate (LOPRESSOR) 25 MG tablet Take 25 mg by mouth 2 (two) times daily.   Yes Historical Provider, MD  pioglitazone (ACTOS) 15 MG tablet Take 15 mg by mouth daily.   Yes Historical Provider, MD  Polyethyl Glycol-Propyl Glycol (SYSTANE) 0.4-0.3 % SOLN Apply 2 drops to eye daily as needed.   Yes Historical Provider, MD  valsartan (DIOVAN) 320 MG tablet Take 320 mg by mouth daily.    Yes Historical Provider, MD  albuterol (PROVENTIL HFA;VENTOLIN HFA) 108 (90 BASE) MCG/ACT inhaler Inhale 2 puffs into the lungs every 4 (four) hours as needed for wheezing or shortness of breath. Patient not taking:  Reported on 10/05/2016 08/14/15   Tatyana Kirichenko, PA-C  IRON PO Take 1 tablet by mouth daily.     Historical Provider, MD    Physical Exam:    Vitals:   10/05/16 1445 10/05/16 1500 10/05/16 1545 10/05/16 1600  BP: (!) 121/108  160/61 153/57  Pulse: 69 77 74 69  Resp: (!) 27 18 23 25   Temp:      TempSrc:      SpO2: 97% 100% 96% 97%       Constitutional: NAD, calm, comfortable  Vitals:   10/05/16 1445 10/05/16 1500 10/05/16 1545 10/05/16 1600  BP: (!) 121/108  160/61 153/57  Pulse: 69 77 74 69  Resp: (!) 27 18 23 25   Temp:      TempSrc:      SpO2: 97% 100% 96% 97%   Eyes: PERRL, lids and conjunctivae normal ENMT: Mucous membranes are moist. Posterior pharynx clear of any exudate or lesions.Normal dentition.  Neck: normal, supple, no masses, no thyromegaly Respiratory: difusee wheezing, no crackles or rhonchi . Normal respiratory effort. No accessory muscle use.  Cardiovascular: Regular rate and rhythm, no murmurs / rubs / gallops. No extremity edema. 2+ pedal pulses. No carotid bruits.  Abdomen:  Obese no tenderness, no masses palpated. No hepatosplenomegaly. Bowel sounds positive.  Musculoskeletal: no clubbing / cyanosis. No joint deformity upper and lower extremities. Good ROM,  no contractures. Normal muscle tone.  Skin: no rashes, lesions, ulcers.  Neurologic: CN 2-12 grossly intact. Sensation intact, DTR normal. Strength 5/5 in all 4.  Psychiatric: Normal judgment and insight. Alert and oriented x 3. Normal mood.     Labs on Admission: I have personally reviewed following labs and imaging studies  CBC:  Recent Labs Lab 10/05/16 1201  WBC 6.1  NEUTROABS 4.8  HGB 13.0  HCT 42.7  MCV 89.3  PLT 123XX123    Basic Metabolic Panel:  Recent Labs Lab 10/05/16 1201  NA 139  K 4.7  CL 103  CO2 28  GLUCOSE 216*  BUN 21*  CREATININE 1.20*  CALCIUM 8.8*    GFR: CrCl cannot be calculated (Unknown ideal weight.).  Liver Function Tests:  Recent Labs Lab 10/05/16 1201  AST 22  ALT 17  ALKPHOS 78  BILITOT 0.4  PROT 6.7  ALBUMIN 3.6   No results for input(s): LIPASE, AMYLASE in the last 168 hours. No results for input(s): AMMONIA in the last 168 hours.  Coagulation Profile: No results for input(s): INR, PROTIME in the last 168 hours.  Cardiac Enzymes: No results for input(s): CKTOTAL, CKMB, CKMBINDEX, TROPONINI in the last 168 hours.  BNP (last 3 results) No results for input(s): PROBNP in the last 8760 hours.  HbA1C: No results for input(s): HGBA1C in the last 72 hours.  CBG: No results for input(s): GLUCAP in the last 168 hours.  Lipid Profile: No results for input(s): CHOL, HDL, LDLCALC, TRIG, CHOLHDL, LDLDIRECT in the last 72 hours.  Thyroid Function Tests: No results for input(s): TSH, T4TOTAL, FREET4, T3FREE, THYROIDAB in the last 72 hours.  Anemia Panel: No results for input(s): VITAMINB12, FOLATE, FERRITIN, TIBC, IRON, RETICCTPCT in the last 72 hours.  Urine analysis:    Component Value Date/Time   COLORURINE YELLOW 03/10/2012 0800   APPEARANCEUR CLEAR 03/10/2012 0800   LABSPEC 1.015 03/10/2012 0800   PHURINE 5.0 03/10/2012 0800   GLUCOSEU NEGATIVE 03/10/2012 0800   HGBUR NEGATIVE 03/10/2012 0800   BILIRUBINUR  NEGATIVE 03/10/2012 0800   KETONESUR NEGATIVE 03/10/2012 0800  PROTEINUR NEGATIVE 03/10/2012 0800   UROBILINOGEN 0.2 03/10/2012 0800   NITRITE NEGATIVE 03/10/2012 0800   LEUKOCYTESUR NEGATIVE 03/10/2012 0800    Sepsis Labs: @LABRCNTIP (procalcitonin:4,lacticidven:4) )No results found for this or any previous visit (from the past 240 hour(s)).   Radiological Exams on Admission: Dg Chest 2 View  Result Date: 10/05/2016 CLINICAL DATA:  Cough and shortness of breath. EXAM: CHEST  2 VIEW COMPARISON:  August 14, 2015 FINDINGS: The heart size and mediastinal contours are within normal limits. Both lungs are clear. The visualized skeletal structures are unremarkable. IMPRESSION: No active cardiopulmonary disease. Electronically Signed   By: Dorise Bullion III M.D   On: 10/05/2016 13:06   Ct Angio Chest Pe W/cm &/or Wo Cm  Result Date: 10/05/2016 CLINICAL DATA:  Hypoxia and shortness of breath. EXAM: CT ANGIOGRAPHY CHEST WITH CONTRAST TECHNIQUE: Multidetector CT imaging of the chest was performed using the standard protocol during bolus administration of intravenous contrast. Multiplanar CT image reconstructions and MIPs were obtained to evaluate the vascular anatomy. CONTRAST:  75 mL Isovue 370 COMPARISON:  Chest radiograph 10/05/2016.  Chest CT 04/23/2009 FINDINGS: Cardiovascular: Negative for pulmonary embolism. Coronary artery calcifications. Normal caliber of the thoracic aorta without dissection. Atherosclerotic calcifications in the aorta. There is a bovine arch. Bilateral common carotid arteries are very medial in the lower neck. Main branches of celiac trunk are patent. Mediastinum/Nodes: Small hiatal hernia. Few small mediastinal lymph nodes. Largest node is in the precarinal area measuring 1.1 in the short axis and this previously measured 1.0 cm. Overall, there is not significant chest lymphadenopathy. No significant pericardial fluid. No significant axillary lymphadenopathy. Mild  heterogeneity of the thyroid tissue. Lungs/Pleura: Trachea and mainstem bronchi are patent. Subtle ground-glass densities in the left lower lobe on sequence 5, image 81. Otherwise, the lungs are clear without significant airspace disease or lung consolidation. No pleural fluid. Upper Abdomen: No acute abnormality. Musculoskeletal: Degenerative changes in the lower cervical spine. No acute bone abnormality. Review of the MIP images confirms the above findings. IMPRESSION: Negative for pulmonary embolism. No acute chest abnormality. Subtle ground-glass densities in the left lower lobe are nonspecific but could represent atelectasis or old disease. Small hiatal hernia. Electronically Signed   By: Markus Daft M.D.   On: 10/05/2016 14:48    EKG: Independently reviewed.  Assessment/Plan Active Problems:   Hypoxia   Hypertension   Dyspnea   Chronic diastolic CHF (congestive heart failure) (HCC)   Dyslipidemia   Diabetes mellitus (HCC)   Acute Respiratory Failure with Hypoxia likely due to worsening upper respiratory infection Presenting now with ongoing / progressive symptoms including rhinorrhea, white productive cough. Afebrile CT scan neg for PE. No acute findings to explain symptoms.  She received 2 x Duo Nebs and Solumedrol with improvement of her symptoms . O2 normal in 2 L WBC normal. Afebrile  Admit to med surg obs  Oxygen as needed  sputum cultures Nebulizers as needed, with Duoneb q 4 and Albuterol q 2 h prn Repeat CBC in am  Type II Diabetes with neuropathy  Current blood sugar level is 216 Hgb A1C  Lantus , SSI Continue Neurontin  Heart healthy carb modified diet.   Hypertension BP 133/57 Pulse 72  Controlled Continue home anti-hypertensive medications   Chronic kidney disease stage   3  baseline creatinine  1.16  Current Cr 1.2  Lab Results  Component Value Date   CREATININE 1.20 (H) 10/05/2016   CREATININE 1.33 (H) 08/14/2015   CREATININE 1.16 (H) 03/07/2012  Repeat CMET  in am   Diastolic CHF:No acute decompensation Last 2 D echo 2013 . BNP 200 in view of O2 demand ejection fraction was 60%.  grade 1 diastolic dysfunction  Continue meds  Obtain daily weights Monitor intake and output   DVT prophylaxis: Heparin  Code Status:   Full  Family Communication:  Discussed with patient Disposition Plan: Expect patient to be discharged to home after condition improves Consults called:    None Admission status   Obs  Medsurg      [place], PA-C [facility]   10/05/2016, 4:32 PM

## 2016-10-05 NOTE — Progress Notes (Signed)
Patient arrived to unit via stretcher; tele in place and verified with CCMD. Stable; assessment is as charted.

## 2016-10-05 NOTE — ED Notes (Signed)
Pt satting mmid 80's on RA.  Placed back on 3LNC. Sats improved to mid 90's.

## 2016-10-05 NOTE — ED Provider Notes (Signed)
[facility] DEPT Provider Note   CSN: [id number] Arrival date & time: 10/05/16  1018     History   Chief Complaint Chief Complaint  Patient presents with  . Nasal Congestion  . Nausea  . Shortness of Breath    HPI [name] is a 82 y.o. female.  82 year old Caucasian female with a past medical history significant for diastolic heart failure (last EF 60% in 08/2012), diabetes, hypertension, CAD, CKD that presents to the ED today with multiple complaints including cough, chest congestion, shortness of breath, nasal congestion. Patient states that approximately 5 days ago she developed a productive cough with white sputum. Patient also endorses chest congestion and nasal congestion and rhinorrhea. States that she was around her family the day prior with multiple sick contacts. She has not tried any other the counter medication for her symptoms. She states that she has developed shortness of breath of the past 2 days along with wheezing. The shortness of breath is with exertion. Patient denies any underlying lung condition. States that she does have history of bronchitis. States this feels similar but has gradually worsened. She denies any chest pain. SOB is not pleuritic in nature. She also endorses nausea without any emesis that started yesterday. The nausea has self resolved. States that she has edema in her lower extremities that is baseline.   Patient denies any fever, chills , lightheadedness, dizziness, headache, vision changes, otalgia, sore throat, sinus pressure, chest pain, abdominal pain, emesis, urinary symptoms, change in bowel habits, numbness/tingling. Patient denies any history of DVT, hormone use, recent hospitalizations/surgeries, prolonged immobilizations, unilateral leg swelling, calf tenderness, tobacco use.      Past Medical History:  Diagnosis Date  . Anasarca 04/2009   found to be secondary to pericardial effusion with tamponade/pericardial window done  .  Bladder infection    pt has been on antibiotic .for this  this week .   Marland Kitchen Chronic kidney disease   . Coronary artery disease   . Diabetes mellitus    insulin dependent  . Diastolic congestive heart failure (HCC)     Chronic diastolic congestive heart failure  . Dyspnea   . Herpes labialis    Healing herpes labialis  . Hypercholesterolemia   . Hypertension   . Lumbar radiculopathy   . Lumbar scoliosis   . Morbid obesity ([place])   . Pericardial effusion  04/2009    with tamponade  . PONV (postoperative nausea and vomiting)    has not had any problems since 2001  . Shortness of breath  04/20/2009  . Spondylolisthesis   . Spondylosis     Patient Active Problem List   Diagnosis Date Noted  . Hyperlipidemia 08/21/2016  . Diabetes mellitus with neurological manifestation ([place]) 06/07/2014  . Pedal edema 10/20/2012  . Chronic diastolic CHF (congestive heart failure) ([place]) 03/18/2011  . Dyslipidemia 03/18/2011  . Diabetes mellitus 03/18/2011  . Sciatica neuralgia 03/18/2011  . Hypertension   . Dyspnea   . Anasarca 04/04/2009    Past Surgical History:  Procedure Laterality Date  . BACK SURGERY     microdiskectomy L3-4 on L/partial facetectomy 3-4 on L/removal of synovial cyst on left L3-4  . BREAST BIOPSY     left  . CARDIAC CATHETERIZATION  04/25/2003   L heart cath w/coronary angiography/R femoral artery/ EF 65%/no mitral regurgitation/ angiography L main coronary artery smooth & normal  . EYE SURGERY     ophthalmoscopy/peritomy adjacent to the limbus from the 8 to 2:30 position superiorly  .  lumbar laminectomy    . SUBXYPHOID PERICARDIAL WINDOW  04/2009   for pericardial effusion w/pericardial tamponade/sac drained w/20-French Baard drain/transesophageal echocardiogram confimed complete evacution of pericardial fluid  . TOTAL HIP ARTHROPLASTY  03/06/2012   Procedure: TOTAL HIP ARTHROPLASTY;  Surgeon: Kerin Salen, MD;  Location: [facility];  Service: Orthopedics;  Laterality:  Left;  DEPUY/PINNACLE, SUMMIT STEM, CUP POLY & CERAMIC  . VAGINAL HYSTERECTOMY      OB History    No data available       Home Medications    Prior to Admission medications   Medication Sig Start Date End Date Taking? Authorizing Provider  albuterol (PROVENTIL HFA;VENTOLIN HFA) 108 (90 BASE) MCG/ACT inhaler Inhale 2 puffs into the lungs every 4 (four) hours as needed for wheezing or shortness of breath. Patient not taking: Reported on 08/21/2016 08/14/15   Tatyana Kirichenko, PA-C  amLODipine (NORVASC) 10 MG tablet Take 5 mg by mouth daily.     Historical Provider, MD  atorvastatin (LIPITOR) 80 MG tablet Take 40 mg by mouth every evening.    Historical Provider, MD  ergocalciferol (VITAMIN D2) 50000 UNITS capsule Take 50,000 Units by mouth once a week. On friday 07/30/11   Historical Provider, MD  furosemide (LASIX) 80 MG tablet Take 1 tablet (80 mg total) by mouth daily. 09/09/16   Thayer Headings, MD  gabapentin (NEURONTIN) 300 MG capsule 2 TABLETS BY MOUTH AT BEDTIME 08/06/16   Thayer Headings, MD  GLIPIZIDE XL 5 MG 24 hr tablet Take 5 mg by mouth Daily. 06/12/12   Historical Provider, MD  guaiFENesin (ROBITUSSIN) 100 MG/5ML liquid Take 5-10 mLs (100-200 mg total) by mouth every 4 (four) hours as needed for cough. 08/14/15   Tatyana Kirichenko, PA-C  insulin aspart (NOVOLOG) 100 UNIT/ML injection Inject into the skin as directed. Based on a sliding scale. Inject 3-4 units into the skin daily with breakfast, inject 3-4 units into the skin with lunch. Inject 8 units into the skin daily with dinner.    Historical Provider, MD  insulin glargine (LANTUS) 100 UNIT/ML injection Inject 18 Units into the skin at bedtime.     Historical Provider, MD  IRON PO Take 1 tablet by mouth daily.     Historical Provider, MD  metoprolol tartrate (LOPRESSOR) 25 MG tablet Take 25 mg by mouth 2 (two) times daily.    Historical Provider, MD  pioglitazone (ACTOS) 15 MG tablet Take 15 mg by mouth daily.    Historical  Provider, MD  valsartan (DIOVAN) 320 MG tablet Take 320 mg by mouth daily.     Historical Provider, MD    Family History Family History  Problem Relation Age of Onset  . Heart attack Father   . Hypertension Father   . Stroke Mother   . Angina Mother   . Coronary artery disease      Social History Social History  Substance Use Topics  . Smoking status: Never Smoker  . Smokeless tobacco: Never Used  . Alcohol use No     Allergies   Codeine; Lactose intolerance (gi); Lodine [etodolac]; Percocet [oxycodone-acetaminophen]; Sulfa drugs cross reactors; and Sulfamethoxazole   Review of Systems Review of Systems  Constitutional: Negative for chills and fever.  HENT: Positive for congestion and rhinorrhea. Negative for ear pain, sinus pain, sinus pressure and sore throat.   Eyes: Negative for photophobia and visual disturbance.  Respiratory: Positive for cough, shortness of breath and wheezing.   Cardiovascular: Positive for leg swelling. Negative for chest  pain and palpitations.  Gastrointestinal: Negative for abdominal pain, blood in stool, diarrhea, nausea and vomiting.  Genitourinary: Negative for dysuria, frequency, hematuria and urgency.  Skin: Negative.   Neurological: Negative for dizziness, syncope, weakness, light-headedness, numbness and headaches.     Physical Exam Updated Vital Signs BP 174/71   Pulse 72   Temp 98.2 F (36.8 C) (Oral)   Resp 16   SpO2 96%   Physical Exam  Constitutional: She is oriented to person, place, and time. She appears well-developed and well-nourished. No distress.  HENT:  Head: Normocephalic and atraumatic.  Right Ear: Tympanic membrane, external ear and ear canal normal.  Left Ear: Tympanic membrane, external ear and ear canal normal.  Nose: Rhinorrhea present.  Mouth/Throat: Uvula is midline, oropharynx is clear and moist and mucous membranes are normal.  Eyes: Conjunctivae are normal. Right eye exhibits no discharge. Left eye  exhibits no discharge. No scleral icterus.  Neck: Normal range of motion. Neck supple. No thyromegaly present.  Cardiovascular: Normal rate, regular rhythm, normal heart sounds and intact distal pulses.  Exam reveals no gallop and no friction rub.   No murmur heard. Pulmonary/Chest: Effort normal. No respiratory distress. She has wheezes. She exhibits no tenderness.  X-ray wheezes noted throughout all lung fields. She also has coarse sounds noted at the base of the lungs. The patient was initially hypoxic on room air in the low 80s. She was put on 3 L of nasal cannula oxygen and improved his saturation 95%. Patient has not tachypneic at this time. No accessory muscle use.  Abdominal: Soft. Bowel sounds are normal. She exhibits no distension. There is no tenderness. There is no rebound and no guarding.  Musculoskeletal: Normal range of motion.  3+ pitting edema bilateral lower extremity up to the level of the knee. DP pulses are 2+ bilaterally. No calf tenderness. No erythema or ecchymosis of the lower shortness.  Lymphadenopathy:    She has no cervical adenopathy.  Neurological: She is alert and oriented to person, place, and time.  Skin: Skin is warm and dry. Capillary refill takes less than 2 seconds.  Nursing note and vitals reviewed.    ED Treatments / Results  Labs (all labs ordered are listed, but only abnormal results are displayed) Labs Reviewed  CBC WITH DIFFERENTIAL/PLATELET  COMPREHENSIVE METABOLIC PANEL  BRAIN NATRIURETIC PEPTIDE  I-STAT [place], ED    EKG  EKG Interpretation  Date/Time:  Saturday October 05 2016 10:28:49 EST Ventricular Rate:  79 PR Interval:  194 QRS Duration: 104 QT Interval:  386 QTC Calculation: 442 R Axis:   -82 Text Interpretation:  Sinus rhythm with Premature atrial complexes with Abberant conduction Left axis deviation Nonspecific ST abnormality Abnormal ECG No significant change since last tracing Confirmed by LITTLE MD, RACHEL [phone]) on  10/05/2016 11:49:36 AM       Radiology Dg Chest 2 View  Result Date: 10/05/2016 CLINICAL DATA:  Cough and shortness of breath. EXAM: CHEST  2 VIEW COMPARISON:  August 14, 2015 FINDINGS: The heart size and mediastinal contours are within normal limits. Both lungs are clear. The visualized skeletal structures are unremarkable. IMPRESSION: No active cardiopulmonary disease. Electronically Signed   By: Dorise Bullion III M.D   On: 10/05/2016 13:06   Ct Angio Chest Pe W/cm &/or Wo Cm  Result Date: 10/05/2016 CLINICAL DATA:  Hypoxia and shortness of breath. EXAM: CT ANGIOGRAPHY CHEST WITH CONTRAST TECHNIQUE: Multidetector CT imaging of the chest was performed using the standard protocol during bolus administration  of intravenous contrast. Multiplanar CT image reconstructions and MIPs were obtained to evaluate the vascular anatomy. CONTRAST:  75 mL Isovue 370 COMPARISON:  Chest radiograph 10/05/2016.  Chest CT 04/23/2009 FINDINGS: Cardiovascular: Negative for pulmonary embolism. Coronary artery calcifications. Normal caliber of the thoracic aorta without dissection. Atherosclerotic calcifications in the aorta. There is a bovine arch. Bilateral common carotid arteries are very medial in the lower neck. Main branches of celiac trunk are patent. Mediastinum/Nodes: Small hiatal hernia. Few small mediastinal lymph nodes. Largest node is in the precarinal area measuring 1.1 in the short axis and this previously measured 1.0 cm. Overall, there is not significant chest lymphadenopathy. No significant pericardial fluid. No significant axillary lymphadenopathy. Mild heterogeneity of the thyroid tissue. Lungs/Pleura: Trachea and mainstem bronchi are patent. Subtle ground-glass densities in the left lower lobe on sequence 5, image 81. Otherwise, the lungs are clear without significant airspace disease or lung consolidation. No pleural fluid. Upper Abdomen: No acute abnormality. Musculoskeletal: Degenerative changes in the  lower cervical spine. No acute bone abnormality. Review of the MIP images confirms the above findings. IMPRESSION: Negative for pulmonary embolism. No acute chest abnormality. Subtle ground-glass densities in the left lower lobe are nonspecific but could represent atelectasis or old disease. Small hiatal hernia. Electronically Signed   By: Markus Daft M.D.   On: 10/05/2016 14:48    Procedures Procedures (including critical care time)  Medications Ordered in ED Medications  albuterol (PROVENTIL) (2.5 MG/3ML) 0.083% nebulizer solution 5 mg (5 mg Nebulization Given 10/05/16 1203)     Initial Impression / Assessment and Plan / ED Course  I have reviewed the triage vital signs and the nursing notes.  Pertinent labs & imaging results that were available during my care of the patient were reviewed by me and considered in my medical decision making (see chart for details).  Clinical Course   Patient presents to the ED with URI symptoms, cough, sob, and chest congestion. Patient received x 2 duo nebs and solumedrol with improvement in her symptoms. CXR was unremarkable. No leukocytosis. Patient is afebrile and not tachycardic. However, she continued to be hypoxic on RA in the 80's and requiring 3L of o2. Given patient persistent hypoxia ordered CTA. CT was negative for PE. This is likely hypoxia due to bronchitis. BNP mildly elevated. No signs of pulmonary edema on CT.  Last heart echo was in 2013 with EF of 60%. Trop was negative. EKG without acute changes. Doubt ACS or CHF exacerbation. Given patient persists hypoxia and o2 requirements consulted hospital medicine for further management and admission. Spoke with PA [facility] who agrees to admit patient. Patient is hemodynamically stable and in NAD. Patient was seen and evaluated by Dr. Rex Kras who agrees with the above plan.   Final Clinical Impressions(s) / ED Diagnoses   Final diagnoses:  Hypoxia  Cough  Wheezing    New Prescriptions New  Prescriptions   No medications on file     Doristine Devoid, PA-C 10/05/16 [place], MD 10/06/16 [phone]

## 2016-10-06 ENCOUNTER — Observation Stay (HOSPITAL_COMMUNITY): Payer: Medicare Other

## 2016-10-06 DIAGNOSIS — E872 Acidosis: Secondary | ICD-10-CM | POA: Diagnosis not present

## 2016-10-06 DIAGNOSIS — I5032 Chronic diastolic (congestive) heart failure: Secondary | ICD-10-CM | POA: Diagnosis not present

## 2016-10-06 DIAGNOSIS — R05 Cough: Secondary | ICD-10-CM | POA: Diagnosis not present

## 2016-10-06 DIAGNOSIS — R062 Wheezing: Secondary | ICD-10-CM | POA: Diagnosis not present

## 2016-10-06 DIAGNOSIS — I129 Hypertensive chronic kidney disease with stage 1 through stage 4 chronic kidney disease, or unspecified chronic kidney disease: Secondary | ICD-10-CM | POA: Diagnosis not present

## 2016-10-06 DIAGNOSIS — R0902 Hypoxemia: Secondary | ICD-10-CM | POA: Diagnosis not present

## 2016-10-06 DIAGNOSIS — J9601 Acute respiratory failure with hypoxia: Secondary | ICD-10-CM | POA: Diagnosis not present

## 2016-10-06 DIAGNOSIS — Z7982 Long term (current) use of aspirin: Secondary | ICD-10-CM | POA: Diagnosis not present

## 2016-10-06 DIAGNOSIS — R0602 Shortness of breath: Secondary | ICD-10-CM | POA: Diagnosis not present

## 2016-10-06 DIAGNOSIS — J441 Chronic obstructive pulmonary disease with (acute) exacerbation: Secondary | ICD-10-CM | POA: Diagnosis not present

## 2016-10-06 DIAGNOSIS — Z6837 Body mass index (BMI) 37.0-37.9, adult: Secondary | ICD-10-CM | POA: Diagnosis not present

## 2016-10-06 DIAGNOSIS — I5033 Acute on chronic diastolic (congestive) heart failure: Secondary | ICD-10-CM | POA: Diagnosis not present

## 2016-10-06 DIAGNOSIS — E785 Hyperlipidemia, unspecified: Secondary | ICD-10-CM | POA: Diagnosis not present

## 2016-10-06 DIAGNOSIS — R0600 Dyspnea, unspecified: Secondary | ICD-10-CM | POA: Diagnosis not present

## 2016-10-06 DIAGNOSIS — G9341 Metabolic encephalopathy: Secondary | ICD-10-CM | POA: Diagnosis not present

## 2016-10-06 DIAGNOSIS — E119 Type 2 diabetes mellitus without complications: Secondary | ICD-10-CM | POA: Diagnosis not present

## 2016-10-06 DIAGNOSIS — I272 Pulmonary hypertension, unspecified: Secondary | ICD-10-CM | POA: Diagnosis not present

## 2016-10-06 DIAGNOSIS — E875 Hyperkalemia: Secondary | ICD-10-CM | POA: Diagnosis present

## 2016-10-06 DIAGNOSIS — I130 Hypertensive heart and chronic kidney disease with heart failure and stage 1 through stage 4 chronic kidney disease, or unspecified chronic kidney disease: Secondary | ICD-10-CM | POA: Diagnosis not present

## 2016-10-06 DIAGNOSIS — E1129 Type 2 diabetes mellitus with other diabetic kidney complication: Secondary | ICD-10-CM | POA: Diagnosis not present

## 2016-10-06 DIAGNOSIS — E118 Type 2 diabetes mellitus with unspecified complications: Secondary | ICD-10-CM | POA: Diagnosis not present

## 2016-10-06 DIAGNOSIS — G9340 Encephalopathy, unspecified: Secondary | ICD-10-CM | POA: Diagnosis not present

## 2016-10-06 DIAGNOSIS — E1142 Type 2 diabetes mellitus with diabetic polyneuropathy: Secondary | ICD-10-CM | POA: Diagnosis present

## 2016-10-06 DIAGNOSIS — J9622 Acute and chronic respiratory failure with hypercapnia: Secondary | ICD-10-CM | POA: Diagnosis not present

## 2016-10-06 DIAGNOSIS — J9600 Acute respiratory failure, unspecified whether with hypoxia or hypercapnia: Secondary | ICD-10-CM | POA: Diagnosis not present

## 2016-10-06 DIAGNOSIS — T508X5A Adverse effect of diagnostic agents, initial encounter: Secondary | ICD-10-CM | POA: Diagnosis present

## 2016-10-06 DIAGNOSIS — Z8249 Family history of ischemic heart disease and other diseases of the circulatory system: Secondary | ICD-10-CM | POA: Diagnosis not present

## 2016-10-06 DIAGNOSIS — N170 Acute kidney failure with tubular necrosis: Secondary | ICD-10-CM | POA: Diagnosis not present

## 2016-10-06 DIAGNOSIS — R34 Anuria and oliguria: Secondary | ICD-10-CM | POA: Diagnosis present

## 2016-10-06 DIAGNOSIS — N179 Acute kidney failure, unspecified: Secondary | ICD-10-CM | POA: Diagnosis not present

## 2016-10-06 DIAGNOSIS — E1165 Type 2 diabetes mellitus with hyperglycemia: Secondary | ICD-10-CM | POA: Diagnosis not present

## 2016-10-06 DIAGNOSIS — J208 Acute bronchitis due to other specified organisms: Secondary | ICD-10-CM | POA: Diagnosis not present

## 2016-10-06 DIAGNOSIS — E662 Morbid (severe) obesity with alveolar hypoventilation: Secondary | ICD-10-CM | POA: Diagnosis not present

## 2016-10-06 DIAGNOSIS — Z823 Family history of stroke: Secondary | ICD-10-CM | POA: Diagnosis not present

## 2016-10-06 DIAGNOSIS — E1122 Type 2 diabetes mellitus with diabetic chronic kidney disease: Secondary | ICD-10-CM | POA: Diagnosis present

## 2016-10-06 DIAGNOSIS — J440 Chronic obstructive pulmonary disease with acute lower respiratory infection: Secondary | ICD-10-CM | POA: Diagnosis not present

## 2016-10-06 DIAGNOSIS — J9690 Respiratory failure, unspecified, unspecified whether with hypoxia or hypercapnia: Secondary | ICD-10-CM | POA: Diagnosis not present

## 2016-10-06 DIAGNOSIS — M79609 Pain in unspecified limb: Secondary | ICD-10-CM | POA: Diagnosis not present

## 2016-10-06 DIAGNOSIS — J9801 Acute bronchospasm: Secondary | ICD-10-CM | POA: Diagnosis present

## 2016-10-06 DIAGNOSIS — R8271 Bacteriuria: Secondary | ICD-10-CM | POA: Diagnosis present

## 2016-10-06 DIAGNOSIS — N183 Chronic kidney disease, stage 3 (moderate): Secondary | ICD-10-CM | POA: Diagnosis present

## 2016-10-06 DIAGNOSIS — M7989 Other specified soft tissue disorders: Secondary | ICD-10-CM | POA: Diagnosis not present

## 2016-10-06 LAB — COMPREHENSIVE METABOLIC PANEL
ALT: 16 U/L (ref 14–54)
AST: 20 U/L (ref 15–41)
Albumin: 3.1 g/dL — ABNORMAL LOW (ref 3.5–5.0)
Alkaline Phosphatase: 75 U/L (ref 38–126)
Anion gap: 10 (ref 5–15)
BUN: 36 mg/dL — ABNORMAL HIGH (ref 6–20)
CO2: 28 mmol/L (ref 22–32)
Calcium: 8.7 mg/dL — ABNORMAL LOW (ref 8.9–10.3)
Chloride: 100 mmol/L — ABNORMAL LOW (ref 101–111)
Creatinine, Ser: 1.59 mg/dL — ABNORMAL HIGH (ref 0.44–1.00)
GFR calc Af Amer: 34 mL/min — ABNORMAL LOW (ref >60)
GFR calc non Af Amer: 29 mL/min — ABNORMAL LOW (ref >60)
Glucose, Bld: 293 mg/dL — ABNORMAL HIGH (ref 65–99)
Potassium: 4.5 mmol/L (ref 3.5–5.1)
Sodium: 138 mmol/L (ref 135–145)
Total Bilirubin: 0.5 mg/dL (ref 0.3–1.2)
Total Protein: 6 g/dL — ABNORMAL LOW (ref 6.5–8.1)

## 2016-10-06 LAB — CBC
HCT: 38.5 % (ref 36.0–46.0)
Hemoglobin: 11.7 g/dL — ABNORMAL LOW (ref 12.0–15.0)
MCH: 27.1 pg (ref 26.0–34.0)
MCHC: 30.4 g/dL (ref 30.0–36.0)
MCV: 89.3 fL (ref 78.0–100.0)
Platelets: 204 10*3/uL (ref 150–400)
RBC: 4.31 MIL/uL (ref 3.87–5.11)
RDW: 15.2 % (ref 11.5–15.5)
WBC: 5.9 10*3/uL (ref 4.0–10.5)

## 2016-10-06 LAB — URINALYSIS, ROUTINE W REFLEX MICROSCOPIC
Bilirubin Urine: NEGATIVE
Glucose, UA: NEGATIVE mg/dL
Hgb urine dipstick: NEGATIVE
Ketones, ur: NEGATIVE mg/dL
Leukocytes, UA: NEGATIVE
Nitrite: NEGATIVE
Protein, ur: NEGATIVE mg/dL
Specific Gravity, Urine: 1.022 (ref 1.005–1.030)
pH: 5 (ref 5.0–8.0)

## 2016-10-06 LAB — GLUCOSE, CAPILLARY
Glucose-Capillary: 254 mg/dL — ABNORMAL HIGH (ref 65–99)
Glucose-Capillary: 206 mg/dL — ABNORMAL HIGH (ref 65–99)
Glucose-Capillary: 185 mg/dL — ABNORMAL HIGH (ref 65–99)
Glucose-Capillary: 191 mg/dL — ABNORMAL HIGH (ref 65–99)

## 2016-10-06 LAB — HEMOGLOBIN A1C
Hgb A1c MFr Bld: 6.1 % — ABNORMAL HIGH (ref 4.8–5.6)
Mean Plasma Glucose: 128 mg/dL

## 2016-10-06 MED ORDER — ALBUTEROL SULFATE (2.5 MG/3ML) 0.083% IN NEBU
2.5000 mg | INHALATION_SOLUTION | RESPIRATORY_TRACT | Status: DC
  Administered 2016-10-06 – 2016-10-07 (×5): 2.5 mg via RESPIRATORY_TRACT
  Filled 2016-10-06 (×8): qty 3

## 2016-10-06 MED ORDER — DOXYCYCLINE HYCLATE 100 MG PO TABS
100.0000 mg | ORAL_TABLET | Freq: Two times a day (BID) | ORAL | Status: DC
  Administered 2016-10-06 – 2016-10-07 (×3): 100 mg via ORAL
  Filled 2016-10-06 (×3): qty 1

## 2016-10-06 NOTE — Progress Notes (Signed)
PROGRESS NOTE    Laurie [name]  [id number] DOB: 1930-12-30 DOA: 10/05/2016 PCP: Limmie Patricia, MD   Chief Complaint  Patient presents with  . Nasal Congestion  . Nausea  . Shortness of Breath    Brief Narrative:  HPI on 10/05/2016 by Ms. Sharene Butters, PA [name] is a 82 y.o. female with a history of the study heart failure, last EF 60% in October 2013, diabetes, hypertension, CAD, security, presenting to the ED today with multiple complaints including cough chest congestion shortness of breath and nasal congestion, without epistaxis or hemoptysis. This began about 5 days ago, and insight accompanied by white sputum. She also endorses chest congestion, and reports possible sick contacts. She has not tried any over-the-counter medication. She has developed wheezing, with shortness of breath prior to presentation. She also reports shortness of breath with exertion. She denies any history of COPD. She does have prior history of bronchitis. She reports that these symptoms feel similar to her prior bronchitis, although worse. She denies any chest pain, nausea or vomiting. She denies any abdominal pain or diarrhea. She denies any lower extremity swelling. She denies any lightheadedness, dizziness or headache. No vision changes. No apology. She denies any urinary symptoms. She denies any history of DVT, hormonal use, recent hospitalizations or surgeries, prolonged immobilization or calf tenderness. Assessment & Plan   Acute Respiratory failure with hypoxia/ ?COPD Exacerbation -Possibly secondary to upper respiratory infection -Upon admission, patient's SPO2 was 85% on room air -Chest x-ray on admission unremarkable for infection -CTA chest: Negative for pulmonary embolism, no acute chest abnormality -Continue nebulizer treatments, oxygen as needed to maintain sats above 92%, Solu-Medrol -Will place patient on doxycycline -Will ask for O2 saturations be monitored with and without oxygen at  rest and with ambulation  Acute on chronic kidney failure, stage III -Baseline creatinine approximately 1.1-1.3 -Currently 1.59 -Continue to monitor BMP closely  Diabetes mellitus, type II with peripheral neuropathy -Hemoglobin A1c -Continue Lantus, insulin sliding scale and CBG monitoring -Continue gabapentin for neuropathy  Essential hypertension -Continue metoprolol, amlodipine, Avapro  Chronic diastolic heart failure -BNP 200 on admission -Echocardiogram April 2013 shows an EF of 123456, grade 1 diastolic dysfunction -Currently appears to be euvolemic -Monitor intake and output, daily weights -Of note, patient has chronic lower extremity edema  Hyperlipidemia -Continue statin  DVT Prophylaxis  heparin  Code Status: full  Family Communication: none at bedside  Disposition Plan: observation, home when stable  Consultants None  Procedures  None  Antibiotics   Anti-infectives    None      Subjective:   [name] seen and examined today.  Patient feels her breathing has improved. Does not feel that her breathing is back to her baseline. States she feels she had bronchitis approximately one week ago and never fully got over it. Currently denies chest pain, abdominal pain, nausea or vomiting, diarrhea or constipation. Denies dizziness or headache.  Objective:   Vitals:   10/06/16 0024 10/06/16 0500 10/06/16 0943 10/06/16 1009  BP: (!) 133/50 119/69 (!) 139/54   Pulse: 80 81 74   Resp: 20 18 18    Temp: 98.7 F (37.1 C) 98.4 F (36.9 C) 98 F (36.7 C)   TempSrc: Oral Oral Oral   SpO2:  94% 95% 95%  Weight: 100.5 kg (221 lb 8 oz)     Height: 5\' 4"  (1.626 m) 5\' 4"  (1.626 m)      Intake/Output Summary (Last 24 hours) at 10/06/16 1012 Last data filed  at 10/06/16 0900  Gross per 24 hour  Intake              240 ml  Output              300 ml  Net              -60 ml   Filed Weights   10/06/16 0024  Weight: 100.5 kg (221 lb 8 oz)    Exam  General:  Well developed, well nourished, NAD, appears stated age  HEENT: NCAT, mucous membranes moist.   Cardiovascular: S1 S2 auscultated, no rubs, murmurs or gallops. Regular rate and rhythm.  Respiratory:Diminished breath sounds, diffuse expiratory wheezing  Abdomen: Soft, obese, nontender, nondistended, + bowel sounds  Extremities: warm dry without cyanosis clubbing. 2+ LE edema  Neuro: AAOx3, nonfocal  Skin: Without rashes exudates or nodules, seborrheic keratosis  Psych: Normal affect and demeanor with intact judgement and insight   Data Reviewed: I have personally reviewed following labs and imaging studies  CBC:  Recent Labs Lab 10/05/16 1201 10/06/16 0536  WBC 6.1 5.9  NEUTROABS 4.8  --   HGB 13.0 11.7*  HCT 42.7 38.5  MCV 89.3 89.3  PLT 185 0000000   Basic Metabolic Panel:  Recent Labs Lab 10/05/16 1201 10/06/16 0536  NA 139 138  K 4.7 4.5  CL 103 100*  CO2 28 28  GLUCOSE 216* 293*  BUN 21* 36*  CREATININE 1.20* 1.59*  CALCIUM 8.8* 8.7*   GFR: Estimated Creatinine Clearance: 30.9 mL/min (by C-G formula based on SCr of 1.59 mg/dL (H)). Liver Function Tests:  Recent Labs Lab 10/05/16 1201 10/06/16 0536  AST 22 20  ALT 17 16  ALKPHOS 78 75  BILITOT 0.4 0.5  PROT 6.7 6.0*  ALBUMIN 3.6 3.1*   No results for input(s): LIPASE, AMYLASE in the last 168 hours. No results for input(s): AMMONIA in the last 168 hours. Coagulation Profile: No results for input(s): INR, PROTIME in the last 168 hours. Cardiac Enzymes: No results for input(s): CKTOTAL, CKMB, CKMBINDEX, TROPONINI in the last 168 hours. BNP (last 3 results) No results for input(s): PROBNP in the last 8760 hours. HbA1C: No results for input(s): HGBA1C in the last 72 hours. CBG:  Recent Labs Lab 10/05/16 1659 10/05/16 2319 10/06/16 0756  GLUCAP 204* 369* 254*   Lipid Profile: No results for input(s): CHOL, HDL, LDLCALC, TRIG, CHOLHDL, LDLDIRECT in the last 72 hours. Thyroid Function  Tests: No results for input(s): TSH, T4TOTAL, FREET4, T3FREE, THYROIDAB in the last 72 hours. Anemia Panel: No results for input(s): VITAMINB12, FOLATE, FERRITIN, TIBC, IRON, RETICCTPCT in the last 72 hours. Urine analysis:    Component Value Date/Time   COLORURINE YELLOW 03/10/2012 0800   APPEARANCEUR CLEAR 03/10/2012 0800   LABSPEC 1.015 03/10/2012 0800   PHURINE 5.0 03/10/2012 0800   GLUCOSEU NEGATIVE 03/10/2012 0800   HGBUR NEGATIVE 03/10/2012 0800   BILIRUBINUR NEGATIVE 03/10/2012 0800   KETONESUR NEGATIVE 03/10/2012 0800   PROTEINUR NEGATIVE 03/10/2012 0800   UROBILINOGEN 0.2 03/10/2012 0800   NITRITE NEGATIVE 03/10/2012 0800   LEUKOCYTESUR NEGATIVE 03/10/2012 0800   Sepsis Labs: @LABRCNTIP (procalcitonin:4,lacticidven:4)  )No results found for this or any previous visit (from the past 240 hour(s)).    Radiology Studies: Dg Chest 2 View  Result Date: 10/05/2016 CLINICAL DATA:  Cough and shortness of breath. EXAM: CHEST  2 VIEW COMPARISON:  August 14, 2015 FINDINGS: The heart size and mediastinal contours are within normal limits. Both lungs are clear.  The visualized skeletal structures are unremarkable. IMPRESSION: No active cardiopulmonary disease. Electronically Signed   By: Dorise Bullion III M.D   On: 10/05/2016 13:06   Ct Angio Chest Pe W/cm &/or Wo Cm  Result Date: 10/05/2016 CLINICAL DATA:  Hypoxia and shortness of breath. EXAM: CT ANGIOGRAPHY CHEST WITH CONTRAST TECHNIQUE: Multidetector CT imaging of the chest was performed using the standard protocol during bolus administration of intravenous contrast. Multiplanar CT image reconstructions and MIPs were obtained to evaluate the vascular anatomy. CONTRAST:  75 mL Isovue 370 COMPARISON:  Chest radiograph 10/05/2016.  Chest CT 04/23/2009 FINDINGS: Cardiovascular: Negative for pulmonary embolism. Coronary artery calcifications. Normal caliber of the thoracic aorta without dissection. Atherosclerotic calcifications in the  aorta. There is a bovine arch. Bilateral common carotid arteries are very medial in the lower neck. Main branches of celiac trunk are patent. Mediastinum/Nodes: Small hiatal hernia. Few small mediastinal lymph nodes. Largest node is in the precarinal area measuring 1.1 in the short axis and this previously measured 1.0 cm. Overall, there is not significant chest lymphadenopathy. No significant pericardial fluid. No significant axillary lymphadenopathy. Mild heterogeneity of the thyroid tissue. Lungs/Pleura: Trachea and mainstem bronchi are patent. Subtle ground-glass densities in the left lower lobe on sequence 5, image 81. Otherwise, the lungs are clear without significant airspace disease or lung consolidation. No pleural fluid. Upper Abdomen: No acute abnormality. Musculoskeletal: Degenerative changes in the lower cervical spine. No acute bone abnormality. Review of the MIP images confirms the above findings. IMPRESSION: Negative for pulmonary embolism. No acute chest abnormality. Subtle ground-glass densities in the left lower lobe are nonspecific but could represent atelectasis or old disease. Small hiatal hernia. Electronically Signed   By: Markus Daft M.D.   On: 10/05/2016 14:48     Scheduled Meds: . amLODipine  10 mg Oral Daily  . aspirin EC  81 mg Oral [id number]  . atorvastatin  40 mg Oral QPM  . gabapentin  600 mg Oral QHS  . heparin  5,000 Units Subcutaneous Q8H  . insulin aspart  0-9 Units Subcutaneous TID WC  . insulin glargine  10 Units Subcutaneous QHS  . irbesartan  300 mg Oral Daily  . methylPREDNISolone (SOLU-MEDROL) injection  60 mg Intravenous Q12H  . metoprolol tartrate  25 mg Oral BID   Continuous Infusions:   LOS: 0 days   Time Spent in minutes   30 minutes  Adilen Pavelko D.O. on 10/06/2016 at 10:12 AM  Between 7am to 7pm - Pager - [phone]  After 7pm go to [email or web address] - password [email or web address]  And look for the night coverage person covering for me after hours  [facility] Office  [phone]

## 2016-10-07 LAB — EXPECTORATED SPUTUM ASSESSMENT W REFEX TO RESP CULTURE

## 2016-10-07 LAB — EXPECTORATED SPUTUM ASSESSMENT W GRAM STAIN, RFLX TO RESP C

## 2016-10-07 LAB — BASIC METABOLIC PANEL
Anion gap: 11 (ref 5–15)
BUN: 54 mg/dL — ABNORMAL HIGH (ref 6–20)
CO2: 25 mmol/L (ref 22–32)
Calcium: 8.7 mg/dL — ABNORMAL LOW (ref 8.9–10.3)
Chloride: 98 mmol/L — ABNORMAL LOW (ref 101–111)
Creatinine, Ser: 2.01 mg/dL — ABNORMAL HIGH (ref 0.44–1.00)
GFR calc Af Amer: 25 mL/min — ABNORMAL LOW (ref >60)
GFR calc non Af Amer: 22 mL/min — ABNORMAL LOW (ref >60)
Glucose, Bld: 239 mg/dL — ABNORMAL HIGH (ref 65–99)
Potassium: 5.2 mmol/L — ABNORMAL HIGH (ref 3.5–5.1)
Sodium: 134 mmol/L — ABNORMAL LOW (ref 135–145)

## 2016-10-07 LAB — GLUCOSE, CAPILLARY
Glucose-Capillary: 263 mg/dL — ABNORMAL HIGH (ref 65–99)
Glucose-Capillary: 274 mg/dL — ABNORMAL HIGH (ref 65–99)
Glucose-Capillary: 221 mg/dL — ABNORMAL HIGH (ref 65–99)
Glucose-Capillary: 184 mg/dL — ABNORMAL HIGH (ref 65–99)

## 2016-10-07 MED ORDER — FLUTICASONE PROPIONATE 50 MCG/ACT NA SUSP
2.0000 | Freq: Every day | NASAL | Status: DC
  Administered 2016-10-07 – 2016-10-15 (×8): 2 via NASAL
  Filled 2016-10-07 (×2): qty 16

## 2016-10-07 MED ORDER — SODIUM CHLORIDE 0.9 % IV SOLN
INTRAVENOUS | Status: DC
  Administered 2016-10-07: 08:00:00 via INTRAVENOUS

## 2016-10-07 MED ORDER — DEXTROSE 5 % IV SOLN
500.0000 mg | INTRAVENOUS | Status: DC
  Administered 2016-10-07 – 2016-10-09 (×3): 500 mg via INTRAVENOUS
  Filled 2016-10-07 (×6): qty 500

## 2016-10-07 MED ORDER — DEXTROSE 5 % IV SOLN
1.0000 g | INTRAVENOUS | Status: AC
  Administered 2016-10-07 – 2016-10-11 (×5): 1 g via INTRAVENOUS
  Filled 2016-10-07 (×6): qty 10

## 2016-10-07 MED ORDER — PROMETHAZINE HCL 25 MG/ML IJ SOLN
12.5000 mg | INTRAMUSCULAR | Status: DC | PRN
  Administered 2016-10-07: 12.5 mg via INTRAVENOUS
  Filled 2016-10-07: qty 1

## 2016-10-07 NOTE — Progress Notes (Signed)
PROGRESS NOTE    Laurie [name]  [id number] DOB: Apr 20, 1931 DOA: 10/05/2016 PCP: Limmie Patricia, MD   Chief Complaint  Patient presents with  . Nasal Congestion  . Nausea  . Shortness of Breath    Brief Narrative:  HPI on 10/05/2016 by Ms. Sharene Butters, PA Laurie [name] is a 82 y.o. female with a history of the study [name] failure, last EF 60% in October 2013, diabetes, hypertension, CAD, security, presenting to the ED today with multiple complaints including cough chest congestion shortness of breath and nasal congestion, without epistaxis or hemoptysis. This began about 5 days ago, and insight accompanied by white sputum. She also endorses chest congestion, and reports possible sick contacts. She has not tried any over-the-counter medication. She has developed wheezing, with shortness of breath prior to presentation. She also reports shortness of breath with exertion. She denies any history of COPD. She does have prior history of bronchitis. She reports that these symptoms feel similar to her prior bronchitis, although worse. She denies any chest pain, nausea or vomiting. She denies any abdominal pain or diarrhea. She denies any lower extremity swelling. She denies any lightheadedness, dizziness or headache. No vision changes. No apology. She denies any urinary symptoms. She denies any history of DVT, hormonal use, recent hospitalizations or surgeries, prolonged immobilization or calf tenderness. Assessment & Plan   Acute Respiratory failure with hypoxia/ ?COPD Exacerbation -Possibly secondary to upper respiratory infection -Upon admission, patient's SPO2 was 85% on room air -Chest x-ray on admission unremarkable for infection -CXR 12/3: cardiomegaly, chronic mild interstitial lung disease -CTA chest: Negative for pulmonary embolism, no acute chest abnormality -Continue nebulizer treatments, oxygen as needed to maintain sats above 92%, Solu-Medrol -Continue doxycycline  Acute on  chronic kidney failure, stage III -Baseline creatinine approximately 1.1-1.3 -Currently 2.01 -Hold avapro, give gentle IVF -Continue to monitor BMP closely  Hyperkalemia -Potassium 5.2 today, continue to monitor BMP  Diabetes mellitus, type II with peripheral neuropathy -Hemoglobin A1c 6.1 -Continue Lantus, insulin sliding scale and CBG monitoring -Continue gabapentin for neuropathy  Essential hypertension -Continue metoprolol, amlodipine  Chronic diastolic [name] failure -BNP 200 on admission -Echocardiogram April 2013 shows an EF of 123456, grade 1 diastolic dysfunction -Currently appears to be euvolemic -Monitor intake and output, daily weights -Of note, patient has chronic lower extremity edema  Hyperlipidemia -Continue statin  DVT Prophylaxis  heparin  Code Status: full  Family Communication: none at bedside  Disposition Plan: Admitted, home when stable  Consultants None  Procedures  None  Antibiotics   Anti-infectives    Start     Dose/Rate Route Frequency Ordered Stop   10/06/16 1015  doxycycline (VIBRA-TABS) tablet 100 mg     100 mg Oral Every 12 hours 10/06/16 1013        Subjective:   Laurie [name] seen and examined today.  Patient feels horrible this morning and does not feel her breathing is back to baseline. Does not feel that breathing treatments are helping. Continues to wheeze.  Denies chest pain, abdominal pain, nausea or vomiting, diarrhea or constipation. Denies dizziness or headache. Complains of nasal congestion.   Objective:   Vitals:   10/06/16 2124 10/07/16 0531 10/07/16 0740 10/07/16 0853  BP: (!) 141/52 (!) 129/40  (!) 128/52  Pulse: 79 66  66  Resp: 18 18  16   Temp:  97.4 F (36.3 C)  98.2 F (36.8 C)  TempSrc:  Oral  Oral  SpO2: 92% 93% 93% 94%  Weight: 103.5 kg (228  lb 2.8 oz)     Height:        Intake/Output Summary (Last 24 hours) at 10/07/16 1035 Last data filed at 10/07/16 0855  Gross per 24 hour  Intake               360 ml  Output              250 ml  Net              110 ml   Filed Weights   10/06/16 0024 10/06/16 2124  Weight: 100.5 kg (221 lb 8 oz) 103.5 kg (228 lb 2.8 oz)    Exam  General: Well developed, well nourished, NAD  HEENT: NCAT, mucous membranes moist.   Cardiovascular: S1 S2 auscultated, RRR, no murmurs  Respiratory:Diminished breath sounds, diffuse expiratory wheezing  Abdomen: Soft, obese, nontender, nondistended, + bowel sounds  Extremities: warm dry without cyanosis clubbing. 2+ LE edema  Neuro: AAOx3, nonfocal  Skin: Without rashes exudates or nodules, seborrheic keratosis  Psych: Appropriate mood and affect  Data Reviewed: I have personally reviewed following labs and imaging studies  CBC:  Recent Labs Lab 10/05/16 1201 10/06/16 0536  WBC 6.1 5.9  NEUTROABS 4.8  --   HGB 13.0 11.7*  HCT 42.7 38.5  MCV 89.3 89.3  PLT 185 0000000   Basic Metabolic Panel:  Recent Labs Lab 10/05/16 1201 10/06/16 0536 10/07/16 0548  NA 139 138 134*  K 4.7 4.5 5.2*  CL 103 100* 98*  CO2 28 28 25   GLUCOSE 216* 293* 239*  BUN 21* 36* 54*  CREATININE 1.20* 1.59* 2.01*  CALCIUM 8.8* 8.7* 8.7*   GFR: Estimated Creatinine Clearance: 24.8 mL/min (by C-G formula based on SCr of 2.01 mg/dL (H)). Liver Function Tests:  Recent Labs Lab 10/05/16 1201 10/06/16 0536  AST 22 20  ALT 17 16  ALKPHOS 78 75  BILITOT 0.4 0.5  PROT 6.7 6.0*  ALBUMIN 3.6 3.1*   No results for input(s): LIPASE, AMYLASE in the last 168 hours. No results for input(s): AMMONIA in the last 168 hours. Coagulation Profile: No results for input(s): INR, PROTIME in the last 168 hours. Cardiac Enzymes: No results for input(s): CKTOTAL, CKMB, CKMBINDEX, TROPONINI in the last 168 hours. BNP (last 3 results) No results for input(s): PROBNP in the last 8760 hours. HbA1C:  Recent Labs  10/05/16 1738  HGBA1C 6.1*   CBG:  Recent Labs Lab 10/06/16 0756 10/06/16 1159 10/06/16 1655 10/06/16 2117  10/07/16 0759  GLUCAP 254* 206* 185* 191* 263*   Lipid Profile: No results for input(s): CHOL, HDL, LDLCALC, TRIG, CHOLHDL, LDLDIRECT in the last 72 hours. Thyroid Function Tests: No results for input(s): TSH, T4TOTAL, FREET4, T3FREE, THYROIDAB in the last 72 hours. Anemia Panel: No results for input(s): VITAMINB12, FOLATE, FERRITIN, TIBC, IRON, RETICCTPCT in the last 72 hours. Urine analysis:    Component Value Date/Time   COLORURINE YELLOW 10/06/2016 2321   APPEARANCEUR CLOUDY (A) 10/06/2016 2321   LABSPEC 1.022 10/06/2016 2321   PHURINE 5.0 10/06/2016 2321   GLUCOSEU NEGATIVE 10/06/2016 2321   HGBUR NEGATIVE 10/06/2016 2321   BILIRUBINUR NEGATIVE 10/06/2016 2321   KETONESUR NEGATIVE 10/06/2016 2321   PROTEINUR NEGATIVE 10/06/2016 2321   UROBILINOGEN 0.2 03/10/2012 0800   NITRITE NEGATIVE 10/06/2016 2321   LEUKOCYTESUR NEGATIVE 10/06/2016 2321   Sepsis Labs: @LABRCNTIP (procalcitonin:4,lacticidven:4)  )No results found for this or any previous visit (from the past 240 hour(s)).    Radiology Studies: X-ray Chest Pa And  Lateral  Result Date: 10/06/2016 CLINICAL DATA:  Acute respiratory failure.  Shortness of breath. EXAM: CHEST  2 VIEW COMPARISON:  Yesterday. FINDINGS: Interval decreased inspiration and interval enlargement of the cardiac silhouette. Mild increase in prominence of the pulmonary vasculature. Stable mild prominence of the interstitial markings. Thoracic spine degenerative changes. Aortic calcifications. IMPRESSION: 1. Interval mild cardiomegaly, accentuated by a decreased inspiration and the AP technique of the frontal view. 2. Stable mild chronic interstitial lung disease. Electronically Signed   By: Claudie Revering M.D.   On: 10/06/2016 10:26   Dg Chest 2 View  Result Date: 10/05/2016 CLINICAL DATA:  Cough and shortness of breath. EXAM: CHEST  2 VIEW COMPARISON:  August 14, 2015 FINDINGS: The [name] size and mediastinal contours are within normal limits. Both lungs  are clear. The visualized skeletal structures are unremarkable. IMPRESSION: No active cardiopulmonary disease. Electronically Signed   By: Dorise Bullion III M.D   On: 10/05/2016 13:06   Ct Angio Chest Pe W/cm &/or Wo Cm  Result Date: 10/05/2016 CLINICAL DATA:  Hypoxia and shortness of breath. EXAM: CT ANGIOGRAPHY CHEST WITH CONTRAST TECHNIQUE: Multidetector CT imaging of the chest was performed using the standard protocol during bolus administration of intravenous contrast. Multiplanar CT image reconstructions and MIPs were obtained to evaluate the vascular anatomy. CONTRAST:  75 mL Isovue 370 COMPARISON:  Chest radiograph 10/05/2016.  Chest CT 04/23/2009 FINDINGS: Cardiovascular: Negative for pulmonary embolism. Coronary artery calcifications. Normal caliber of the thoracic aorta without dissection. Atherosclerotic calcifications in the aorta. There is a bovine arch. Bilateral common carotid arteries are very medial in the lower neck. Main branches of celiac trunk are patent. Mediastinum/Nodes: Small hiatal hernia. Few small mediastinal lymph nodes. Largest node is in the precarinal area measuring 1.1 in the short axis and this previously measured 1.0 cm. Overall, there is not significant chest lymphadenopathy. No significant pericardial fluid. No significant axillary lymphadenopathy. Mild heterogeneity of the thyroid tissue. Lungs/Pleura: Trachea and mainstem bronchi are patent. Subtle ground-glass densities in the left lower lobe on sequence 5, image 81. Otherwise, the lungs are clear without significant airspace disease or lung consolidation. No pleural fluid. Upper Abdomen: No acute abnormality. Musculoskeletal: Degenerative changes in the lower cervical spine. No acute bone abnormality. Review of the MIP images confirms the above findings. IMPRESSION: Negative for pulmonary embolism. No acute chest abnormality. Subtle ground-glass densities in the left lower lobe are nonspecific but could represent  atelectasis or old disease. Small hiatal hernia. Electronically Signed   By: Markus Daft M.D.   On: 10/05/2016 14:48     Scheduled Meds: . albuterol  2.5 mg Nebulization Q4H  . amLODipine  10 mg Oral Daily  . aspirin EC  81 mg Oral BH-q7a  . atorvastatin  40 mg Oral QPM  . doxycycline  100 mg Oral Q12H  . gabapentin  600 mg Oral QHS  . heparin  5,000 Units Subcutaneous Q8H  . insulin aspart  0-9 Units Subcutaneous TID WC  . insulin glargine  10 Units Subcutaneous QHS  . methylPREDNISolone (SOLU-MEDROL) injection  60 mg Intravenous Q12H  . metoprolol tartrate  25 mg Oral BID   Continuous Infusions: . sodium chloride 75 mL/hr at 10/07/16 0821     LOS: 1 day   Time Spent in minutes   30 minutes  Ashawnti Tangen D.O. on 10/07/2016 at 10:35 AM  Between 7am to 7pm - Pager - [phone]  After 7pm go to [email or web address] - password [email or web address]  And look for the night  coverage person covering for me after hours  [facility] Office  [phone]  

## 2016-10-07 NOTE — Progress Notes (Signed)
Inpatient Diabetes Program Recommendations  AACE/ADA: New Consensus Statement on Inpatient Glycemic Control (2015)  Target Ranges:  Prepandial:   less than 140 mg/dL      Peak postprandial:   less than 180 mg/dL (1-2 hours)      Critically ill patients:  140 - 180 mg/dL   Lab Results  Component Value Date   GLUCAP 263 (H) 10/07/2016   HGBA1C 6.1 (H) 10/05/2016   Review of Glycemic Control  Diabetes history: DM 2 Outpatient Diabetes medications: Lantus 20 units, Novolog 3-4 units meal coverage breakfast and lunch Novolog 8 units meal coverage at dinner, Glipizide 5 mg Daily, Actos 15 mg Daily Current orders for Inpatient glycemic control: Lantus 10 units, Novolog Sensitive TID  Inpatient Diabetes Program Recommendations:  Patient on IV Solumedrol 60 mg Q12hours. Glucose in the 200's with half of home basal dose. Please consider increasing Lantus to 18 units, while on steroids.  Thanks,  Tama Headings RN, MSN, [facility] Inpatient Diabetes Coordinator Team Pager [phone] (8a-5p)

## 2016-10-08 ENCOUNTER — Inpatient Hospital Stay (HOSPITAL_COMMUNITY): Payer: Medicare Other

## 2016-10-08 ENCOUNTER — Encounter (HOSPITAL_COMMUNITY): Payer: Self-pay | Admitting: Physician Assistant

## 2016-10-08 DIAGNOSIS — J208 Acute bronchitis due to other specified organisms: Secondary | ICD-10-CM

## 2016-10-08 DIAGNOSIS — R0602 Shortness of breath: Secondary | ICD-10-CM

## 2016-10-08 DIAGNOSIS — J9622 Acute and chronic respiratory failure with hypercapnia: Secondary | ICD-10-CM

## 2016-10-08 DIAGNOSIS — J9600 Acute respiratory failure, unspecified whether with hypoxia or hypercapnia: Secondary | ICD-10-CM

## 2016-10-08 DIAGNOSIS — R062 Wheezing: Secondary | ICD-10-CM

## 2016-10-08 DIAGNOSIS — R05 Cough: Secondary | ICD-10-CM

## 2016-10-08 DIAGNOSIS — J9601 Acute respiratory failure with hypoxia: Secondary | ICD-10-CM

## 2016-10-08 DIAGNOSIS — R0600 Dyspnea, unspecified: Secondary | ICD-10-CM

## 2016-10-08 DIAGNOSIS — R0902 Hypoxemia: Secondary | ICD-10-CM

## 2016-10-08 DIAGNOSIS — R059 Cough, unspecified: Secondary | ICD-10-CM

## 2016-10-08 LAB — ECHOCARDIOGRAM COMPLETE
E decel time: 211 msec
E/e' ratio: 10.12
FS: 33 % (ref 28–44)
Height: 65 in
IVS/LV PW RATIO, ED: 0.94
LA ID, A-P, ES: 45 mm
LA diam end sys: 45 mm
LA diam index: 2.15 cm/m2
LA vol A4C: 62.4 ml
LA vol index: 26.5 mL/m2
LA vol: 55.4 mL
LV E/e' medial: 10.12
LV E/e'average: 10.12
LV PW d: 11.2 mm — AB (ref 0.6–1.1)
LV e' LATERAL: 9.68 cm/s
LVOT area: 3.14 cm2
LVOT diameter: 20 mm
Lateral S' vel: 13.1 cm/s
MV Dec: 211
MV Peak grad: 4 mmHg
MV pk A vel: 102 m/s
MV pk E vel: 98 m/s
TAPSE: 22.9 mm
TDI e' lateral: 9.68
TDI e' medial: 7.51
Weight: 3633.18 oz

## 2016-10-08 LAB — BASIC METABOLIC PANEL
Anion gap: 8 (ref 5–15)
Anion gap: 4 — ABNORMAL LOW (ref 5–15)
BUN: 70 mg/dL — ABNORMAL HIGH (ref 6–20)
BUN: 76 mg/dL — ABNORMAL HIGH (ref 6–20)
CO2: 30 mmol/L (ref 22–32)
CO2: 33 mmol/L — ABNORMAL HIGH (ref 22–32)
Calcium: 8.5 mg/dL — ABNORMAL LOW (ref 8.9–10.3)
Calcium: 8.5 mg/dL — ABNORMAL LOW (ref 8.9–10.3)
Chloride: 95 mmol/L — ABNORMAL LOW (ref 101–111)
Chloride: 98 mmol/L — ABNORMAL LOW (ref 101–111)
Creatinine, Ser: 2.32 mg/dL — ABNORMAL HIGH (ref 0.44–1.00)
Creatinine, Ser: 2.52 mg/dL — ABNORMAL HIGH (ref 0.44–1.00)
GFR calc Af Amer: 21 mL/min — ABNORMAL LOW (ref >60)
GFR calc Af Amer: 19 mL/min — ABNORMAL LOW (ref >60)
GFR calc non Af Amer: 18 mL/min — ABNORMAL LOW (ref >60)
GFR calc non Af Amer: 17 mL/min — ABNORMAL LOW (ref >60)
Glucose, Bld: 175 mg/dL — ABNORMAL HIGH (ref 65–99)
Glucose, Bld: 171 mg/dL — ABNORMAL HIGH (ref 65–99)
Potassium: 5.5 mmol/L — ABNORMAL HIGH (ref 3.5–5.1)
Potassium: 5.5 mmol/L — ABNORMAL HIGH (ref 3.5–5.1)
Sodium: 133 mmol/L — ABNORMAL LOW (ref 135–145)
Sodium: 135 mmol/L (ref 135–145)

## 2016-10-08 LAB — TSH: TSH: 0.378 u[IU]/mL (ref 0.350–4.500)

## 2016-10-08 LAB — POCT I-STAT 3, ART BLOOD GAS (G3+)
Bicarbonate: 29.3 mmol/L — ABNORMAL HIGH (ref 20.0–28.0)
O2 Saturation: 91 %
Patient temperature: 98.3
TCO2: 31 mmol/L (ref 0–100)
pCO2 arterial: 73.1 mmHg (ref 32.0–48.0)
pH, Arterial: 7.21 — ABNORMAL LOW (ref 7.350–7.450)
pO2, Arterial: 76 mmHg — ABNORMAL LOW (ref 83.0–108.0)

## 2016-10-08 LAB — OSMOLALITY: Osmolality: 319 mOsm/kg — ABNORMAL HIGH (ref 275–295)

## 2016-10-08 LAB — BLOOD GAS, ARTERIAL
Acid-Base Excess: 1.5 mmol/L (ref 0.0–2.0)
Bicarbonate: 30.4 mmol/L — ABNORMAL HIGH (ref 20.0–28.0)
Drawn by: 301361
FIO2: 32
O2 Saturation: 93.4 %
Patient temperature: 98.4
pCO2 arterial: 99.3 mmHg (ref 32.0–48.0)
pH, Arterial: 7.112 — CL (ref 7.350–7.450)
pO2, Arterial: 76.9 mmHg — ABNORMAL LOW (ref 83.0–108.0)

## 2016-10-08 LAB — CREATININE, URINE, RANDOM: Creatinine, Urine: 145.77 mg/dL

## 2016-10-08 LAB — CK: Total CK: 210 U/L (ref 38–234)

## 2016-10-08 LAB — GLUCOSE, CAPILLARY
Glucose-Capillary: 151 mg/dL — ABNORMAL HIGH (ref 65–99)
Glucose-Capillary: 192 mg/dL — ABNORMAL HIGH (ref 65–99)
Glucose-Capillary: 159 mg/dL — ABNORMAL HIGH (ref 65–99)

## 2016-10-08 LAB — POTASSIUM: Potassium: 4.9 mmol/L (ref 3.5–5.1)

## 2016-10-08 LAB — SODIUM, URINE, RANDOM: Sodium, Ur: 10 mmol/L

## 2016-10-08 LAB — OSMOLALITY, URINE: Osmolality, Ur: 333 mOsm/kg (ref 300–900)

## 2016-10-08 LAB — MRSA PCR SCREENING: MRSA by PCR: NEGATIVE

## 2016-10-08 MED ORDER — METHYLPREDNISOLONE SODIUM SUCC 40 MG IJ SOLR
40.0000 mg | Freq: Three times a day (TID) | INTRAMUSCULAR | Status: DC
  Administered 2016-10-08 – 2016-10-10 (×7): 40 mg via INTRAVENOUS
  Filled 2016-10-08 (×7): qty 1

## 2016-10-08 MED ORDER — IPRATROPIUM-ALBUTEROL 0.5-2.5 (3) MG/3ML IN SOLN
3.0000 mL | Freq: Four times a day (QID) | RESPIRATORY_TRACT | Status: DC
  Administered 2016-10-08 – 2016-10-10 (×10): 3 mL via RESPIRATORY_TRACT
  Filled 2016-10-08 (×11): qty 3

## 2016-10-08 MED ORDER — FUROSEMIDE 10 MG/ML IJ SOLN
40.0000 mg | Freq: Once | INTRAMUSCULAR | Status: AC
  Administered 2016-10-08: 40 mg via INTRAVENOUS
  Filled 2016-10-08: qty 4

## 2016-10-08 MED ORDER — FUROSEMIDE 10 MG/ML IJ SOLN
80.0000 mg | Freq: Two times a day (BID) | INTRAMUSCULAR | Status: AC
  Administered 2016-10-08 – 2016-10-10 (×4): 80 mg via INTRAVENOUS
  Filled 2016-10-08 (×4): qty 8

## 2016-10-08 MED ORDER — SODIUM POLYSTYRENE SULFONATE 15 GM/60ML PO SUSP
15.0000 g | Freq: Once | ORAL | Status: AC
  Administered 2016-10-08: 15 g via ORAL
  Filled 2016-10-08: qty 60

## 2016-10-08 NOTE — Discharge Summary (Deleted)
Wrong note type chosen  

## 2016-10-08 NOTE — Progress Notes (Signed)
PCCM Attending Note: Called to bedside by nurse with worsening mental status despite BiPAP. Patient with altered mentation. Low minute ventilation. Saturation 100% on FiO2 0.35. Unable to obtain repeat ABG. Patient only minimally arousable.  Review of systems: Unobtainable given altered mental status.  BP (!) 120/49   Pulse (!) 58   Temp 98.4 F (36.9 C) (Oral)   Resp 15   Ht 5\' 5"  (1.651 m)   Wt 227 lb 1.2 oz (103 kg)   SpO2 96%   BMI 37.79 kg/m  Gen.: Sitting up in bed on BiPAP. Eyes closed. Integument: Warm and dry. No rash on exposed skin. Neurological: Awakening only to deep and painful stimulus. Once awake intermittently follows commands. Pulmonary: Diminished breath sounds bilateral lung bases. Low tidal volume and minute ventilation on BiPAP. Cardiovascular: Regular rate. No appreciable JVD given body habitus.  CBC Latest Ref Rng & Units 10/06/2016 10/05/2016 08/14/2015  WBC 4.0 - 10.5 K/uL 5.9 6.1 9.7  Hemoglobin 12.0 - 15.0 g/dL 11.7(L) 13.0 11.3(L)  Hematocrit 36.0 - 46.0 % 38.5 42.7 36.0  Platelets 150 - 400 K/uL 204 185 211   BMP Latest Ref Rng & Units 10/08/2016 10/08/2016 10/07/2016  Glucose 65 - 99 mg/dL 171(H) 175(H) 239(H)  BUN 6 - 20 mg/dL 76(H) 70(H) 54(H)  Creatinine 0.44 - 1.00 mg/dL 2.52(H) 2.32(H) 2.01(H)  Sodium 135 - 145 mmol/L 135 133(L) 134(L)  Potassium 3.5 - 5.1 mmol/L 5.5(H) 5.5(H) 5.2(H)  Chloride 101 - 111 mmol/L 98(L) 95(L) 98(L)  CO2 22 - 32 mmol/L 33(H) 30 25  Calcium 8.9 - 10.3 mg/dL 8.5(L) 8.5(L) 8.7(L)   A/P: 1. Acute on chronic hypercarbic respiratory failure: Continuing to optimize minute ventilation. Limiting hyperoxygenation due to VQ mismatching. Continuing Solu-Medrol and scheduled Duonebs. 2. Acute bronchitis: Continuing treatment with antibiotics including Rocephin and azithromycin. 3. CODE STATUS: Plan to address CODE STATUS once her son arrives.  I have spent an additional 32 minutes of critical care time today caring for the  patient and reviewing the patient's electronic medical record.  Sonia Baller Ashok Cordia, M.D. [facility] Pager:  [phone] After 3pm or if no response, call [phone] 3:09 PM 10/08/16

## 2016-10-08 NOTE — Progress Notes (Signed)
RT called RN with critical ABG results at 10:39.

## 2016-10-08 NOTE — Progress Notes (Addendum)
PROGRESS NOTE    [name]  [id number] DOB: [birth year] DOA: 10/05/2016 PCP: Limmie Patricia, MD   Chief Complaint  Patient presents with  . Nasal Congestion  . Nausea  . Shortness of Breath    Brief Narrative:  HPI on 10/05/2016 by Ms. Sharene Butters, PA [name] is a 82 y.o. female with a history of the study heart failure, last EF 60% in October 2013, diabetes, hypertension, CAD, security, presenting to the ED today with multiple complaints including cough chest congestion shortness of breath and nasal congestion, without epistaxis or hemoptysis. This began about 5 days ago, and insight accompanied by white sputum. She also endorses chest congestion, and reports possible sick contacts. She has not tried any over-the-counter medication. She has developed wheezing, with shortness of breath prior to presentation. She also reports shortness of breath with exertion. She denies any history of COPD. She does have prior history of bronchitis. She reports that these symptoms feel similar to her prior bronchitis, although worse. She denies any chest pain, nausea or vomiting. She denies any abdominal pain or diarrhea. She denies any lower extremity swelling. She denies any lightheadedness, dizziness or headache. No vision changes. No apology. She denies any urinary symptoms. She denies any history of DVT, hormonal use, recent hospitalizations or surgeries, prolonged immobilization or calf tenderness. Assessment & Plan   Acute Respiratory failure with hypoxia/ ?COPD Exacerbation -Possibly secondary to upper respiratory infection -Upon admission, patient's SPO2 was 85% on room air -Chest x-ray on admission unremarkable for infection -CXR 12/3: cardiomegaly, chronic mild interstitial lung disease -CTA chest: Negative for pulmonary embolism, no acute chest abnormality -Continue nebulizer treatments, oxygen as needed to maintain sats above 92%, Solu-Medrol -Was placed on doxcycyline, however had  some GI upset.  Placed on azithromycin and ceftriaxone. -Repeat CXR 12/5: central vascular congestion without pulm edema, mild basilar atelectasis -Pulmonology consulted and appreciated -ADDENDUM: ABG: pH 7.112/pCO2 99/ pO2 76.9  -Spoke with pulmonology plan for transfer to ICU and place on BiPAP  Acute on chronic kidney failure, stage III -Baseline creatinine approximately 1.1-1.3 -Currently 2.32 -Despite IVF, creatinine continues to rise.   -Avapro discontinued -Continue to monitor urine output -Renal US ordered, urine electrolytes ordered -Continue to monitor BMP closely  Hyperkalemia -Potassium 5.5  -Following hyperkalemia orderset -Will give dose of IV lasix and kayexalate   Diabetes mellitus, type II with peripheral neuropathy -Hemoglobin A1c 6.1 -Continue Lantus, insulin sliding scale and CBG monitoring -Continue gabapentin for neuropathy  Essential hypertension -Continue metoprolol, amlodipine  Chronic diastolic heart failure -BNP 200 on admission -Echocardiogram April 2013 shows an EF of 123456, grade 1 diastolic dysfunction -Currently appears to be euvolemic -Monitor intake and output, daily weights -Of note, patient has chronic lower extremity edema -given one dose of lasix  Hyperlipidemia -Continue statin  DVT Prophylaxis  heparin  Code Status: full  Family Communication: none at bedside  Disposition Plan: Admitted  Consultants PCCM Nephrology   Procedures  None  Antibiotics   Anti-infectives    Start     Dose/Rate Route Frequency Ordered Stop   10/07/16 2000  azithromycin (ZITHROMAX) 500 mg in dextrose 5 % 250 mL IVPB     500 mg 250 mL/hr over 60 Minutes Intravenous Every 24 hours 10/07/16 1811     10/07/16 1900  cefTRIAXone (ROCEPHIN) 1 g in dextrose 5 % 50 mL IVPB     1 g 100 mL/hr over 30 Minutes Intravenous Every 24 hours 10/07/16 1811     10/06/16 1015  doxycycline (VIBRA-TABS) tablet 100 mg  Status:  Discontinued     100 mg Oral Every 12  hours 10/06/16 1013 10/07/16 1757      Subjective:   [name] seen and examined today.  Patient continues to feel congested and have shortness of breath. Does not feel nebulizer treatments are helping.  Had several episodes of vomiting yesterday.  Denies chest pain, abdominal pain, nausea or vomiting, diarrhea or constipation. Denies dizziness or headache.   Objective:   Vitals:   10/07/16 1742 10/07/16 2113 10/08/16 0531 10/08/16 0949  BP: (!) 125/51 (!) 142/58 (!) 134/55 (!) 120/102  Pulse: 66 72 66 67  Resp: 17 18 19    Temp: 97.5 F (36.4 C) 98.2 F (36.8 C) 98.2 F (36.8 C) 98.4 F (36.9 C)  TempSrc: Oral Oral Oral Oral  SpO2: 94% 97% 96% 96%  Weight:  103.8 kg (228 lb 13.4 oz)    Height:        Intake/Output Summary (Last 24 hours) at 10/08/16 1108 Last data filed at 10/08/16 1011  Gross per 24 hour  Intake          1473.75 ml  Output              200 ml  Net          1273.75 ml   Filed Weights   10/06/16 0024 10/06/16 2124 10/07/16 2113  Weight: 100.5 kg (221 lb 8 oz) 103.5 kg (228 lb 2.8 oz) 103.8 kg (228 lb 13.4 oz)    Exam  General: Well developed, well nourished, somnolent  HEENT: NCAT, mucous membranes moist.   Cardiovascular: S1 S2 auscultated, RRR, no murmurs  Respiratory:Diminished breath sounds, diffuse expiratory wheezing  Abdomen: Soft, obese, nontender, nondistended, + bowel sounds  Extremities: warm dry without cyanosis clubbing. 2+ LE edema  Neuro: AAOx3, nonfocal, somnolent this morning   Data Reviewed: I have personally reviewed following labs and imaging studies  CBC:  Recent Labs Lab 10/05/16 1201 10/06/16 0536  WBC 6.1 5.9  NEUTROABS 4.8  --   HGB 13.0 11.7*  HCT 42.7 38.5  MCV 89.3 89.3  PLT 185 0000000   Basic Metabolic Panel:  Recent Labs Lab 10/05/16 1201 10/06/16 0536 10/07/16 0548 10/08/16 0515  NA 139 138 134* 133*  K 4.7 4.5 5.2* 5.5*  CL 103 100* 98* 95*  CO2 28 28 25 30   GLUCOSE 216* 293* 239* 175*  BUN  21* 36* 54* 70*  CREATININE 1.20* 1.59* 2.01* 2.32*  CALCIUM 8.8* 8.7* 8.7* 8.5*   GFR: Estimated Creatinine Clearance: 21.6 mL/min (by C-G formula based on SCr of 2.32 mg/dL (H)). Liver Function Tests:  Recent Labs Lab 10/05/16 1201 10/06/16 0536  AST 22 20  ALT 17 16  ALKPHOS 78 75  BILITOT 0.4 0.5  PROT 6.7 6.0*  ALBUMIN 3.6 3.1*   No results for input(s): LIPASE, AMYLASE in the last 168 hours. No results for input(s): AMMONIA in the last 168 hours. Coagulation Profile: No results for input(s): INR, PROTIME in the last 168 hours. Cardiac Enzymes: No results for input(s): CKTOTAL, CKMB, CKMBINDEX, TROPONINI in the last 168 hours. BNP (last 3 results) No results for input(s): PROBNP in the last 8760 hours. HbA1C:  Recent Labs  10/05/16 1738  HGBA1C 6.1*   CBG:  Recent Labs Lab 10/07/16 0759 10/07/16 1216 10/07/16 1654 10/07/16 2110 10/08/16 0807  GLUCAP 263* 274* 221* 184* 151*   Lipid Profile: No results for input(s): CHOL, HDL, LDLCALC, TRIG, CHOLHDL,  LDLDIRECT in the last 72 hours. Thyroid Function Tests: No results for input(s): TSH, T4TOTAL, FREET4, T3FREE, THYROIDAB in the last 72 hours. Anemia Panel: No results for input(s): VITAMINB12, FOLATE, FERRITIN, TIBC, IRON, RETICCTPCT in the last 72 hours. Urine analysis:    Component Value Date/Time   COLORURINE YELLOW 10/06/2016 2321   APPEARANCEUR CLOUDY (A) 10/06/2016 2321   LABSPEC 1.022 10/06/2016 2321   PHURINE 5.0 10/06/2016 2321   GLUCOSEU NEGATIVE 10/06/2016 2321   HGBUR NEGATIVE 10/06/2016 2321   BILIRUBINUR NEGATIVE 10/06/2016 2321   KETONESUR NEGATIVE 10/06/2016 2321   PROTEINUR NEGATIVE 10/06/2016 2321   UROBILINOGEN 0.2 03/10/2012 0800   NITRITE NEGATIVE 10/06/2016 2321   LEUKOCYTESUR NEGATIVE 10/06/2016 2321   Sepsis Labs: @LABRCNTIP (procalcitonin:4,lacticidven:4)  ) Recent Results (from the past 240 hour(s))  Urine culture     Status: None (Preliminary result)   Collection  Time: 10/06/16 11:22 PM  Result Value Ref Range Status   Specimen Description URINE, RANDOM  Final   Special Requests NONE  Final   Culture CULTURE REINCUBATED FOR BETTER GROWTH  Final   Report Status PENDING  Incomplete  Culture, sputum-assessment     Status: None   Collection Time: 10/07/16  5:09 PM  Result Value Ref Range Status   Specimen Description EXPECTORATED SPUTUM  Final   Special Requests NONE  Final   Sputum evaluation   Final    THIS SPECIMEN IS ACCEPTABLE. RESPIRATORY CULTURE REPORT TO FOLLOW.   Report Status 10/07/2016 FINAL  Final  Culture, respiratory (NON-Expectorated)     Status: None (Preliminary result)   Collection Time: 10/07/16  5:09 PM  Result Value Ref Range Status   Specimen Description SPUTUM  Final   Special Requests NONE  Final   Gram Stain   Final    ABUNDANT WBC PRESENT, PREDOMINANTLY PMN ABUNDANT GRAM POSITIVE COCCI IN PAIRS ABUNDANT GRAM NEGATIVE RODS FEW GRAM NEGATIVE COCCI IN PAIRS FEW GRAM POSITIVE RODS    Culture PENDING  Incomplete   Report Status PENDING  Incomplete      Radiology Studies: Dg Chest 2 View  Result Date: 10/08/2016 CLINICAL DATA:  Shortness of breath, wheezing EXAM: CHEST  2 VIEW COMPARISON:  10/06/2016 FINDINGS: Cardiomegaly again noted. Central vascular congestion without convincing pulmonary edema. Suboptimal exam due to patient's large body habitus. Mild basilar atelectasis. No segmental infiltrate. Mild degenerative changes thoracic spine. IMPRESSION: Central vascular congestion without convincing pulmonary edema. Mild basilar atelectasis. Cardiomegaly. No segmental infiltrate. Electronically Signed   By: Lahoma Crocker M.D.   On: 10/08/2016 08:27   US Renal  Result Date: 10/08/2016 CLINICAL DATA:  Acute kidney injury. History of hypertension, diabetes, and chronic kidney disease. EXAM: RENAL / URINARY TRACT ULTRASOUND COMPLETE COMPARISON:  CT abdomen and pelvis 04/23/2009 FINDINGS: Right Kidney: Length: 11.1 cm.  Mild-to-moderate cortical thinning. Echogenicity within normal limits. No mass or hydronephrosis visualized. Left Kidney: Length: 11.4 cm. Mild cortical thinning. Echogenicity within normal limits. No mass or hydronephrosis visualized. Bladder: Decompressed and not visualized. IMPRESSION: Bilateral renal cortical thinning.  No hydronephrosis. Electronically Signed   By: Logan Bores M.D.   On: 10/08/2016 09:56     Scheduled Meds: . albuterol  2.5 mg Nebulization Q4H  . amLODipine  10 mg Oral Daily  . aspirin EC  81 mg Oral BH-q7a  . atorvastatin  40 mg Oral QPM  . azithromycin  500 mg Intravenous Q24H  . cefTRIAXone (ROCEPHIN)  IV  1 g Intravenous Q24H  . fluticasone  2 spray Each Nare Daily  .  gabapentin  600 mg Oral QHS  . heparin  5,000 Units Subcutaneous Q8H  . insulin aspart  0-9 Units Subcutaneous TID WC  . insulin glargine  10 Units Subcutaneous QHS  . methylPREDNISolone (SOLU-MEDROL) injection  60 mg Intravenous Q12H  . metoprolol tartrate  25 mg Oral BID   Continuous Infusions:    LOS: 2 days   Time Spent in minutes   30 minutes  Uchenna Seufert D.O. on 10/08/2016 at 11:08 AM  Between 7am to 7pm - Pager - [phone]  After 7pm go to [email or web address] - password [email or web address]  And look for the night coverage person covering for me after hours  [facility] Office  [phone]

## 2016-10-08 NOTE — Progress Notes (Signed)
RT attempted to instruct patient on how to use flutter valve but patient did not want to try at this time. Flutter is at bedside and will attempt instruction again in the morning.

## 2016-10-08 NOTE — Progress Notes (Signed)
Physician notified: Minor, NP; Ashok Cordia, MD At: 1357, 1410  Regarding: Decrease in LOC since admission at noon. Only responds to painful stimuli and moans now. On BiPAP.   Orders: Nestor: Stat ABG, Will be by to see patient.

## 2016-10-08 NOTE — Consult Note (Addendum)
Cardiology Consultation Note    Patient ID: [name], MRN: [id number], DOB/AGE: 1931-01-16 82 y.o. Admit date: 10/05/2016   Date of Consult: 10/08/2016 Primary Physician: Limmie Patricia, MD Primary Cardiologist: Dr. Acie Fredrickson (prev Mare Ferrari)  Chief Complaint: SOB, congestion Reason for Consultation: CHF Requesting MD: Dr. Titus Mould  HPI: AANVI [name] is a 82 y.o. female with history of chronic diastolic CHF, pericardial effusion s/p window 2010, DM, mild-mod CAD 2004, HTN, hyperlipidemia, CKD stage III (baseline was 1.3 in 2016), obesity, scoliosis/radiculopathy whom we are asked to see for CHF. Last cath 2004: mid LAD 30-40%, D1 70-80% and D2 subtotally occluded (both were not felt to be candidates for angioplasty - very small size, unlikely to cause significant ischemia), mid RCA 20-25%, PDA/PL and LCx/OM1 unremarkable, EF normal. Nuclear stress test in 2012 was normal. Last echo 2013: mild LVH, EF 60%, no RWMA, grade 1 DD, mild LAE/RAE, mildly reduced RVF.  She presented to [facility] 10/05/16 with several day history of white sputum cough, chest congestion, nasal congestion, and increasing SOB as well as with exertion. POx 85% RA. BNP 200 on adm. Trop neg on adm. CTA 10/05/16: neg for PE, no acute abnormality, subtle ground-glass densities in the left lower lobe are nonspecific but could represent atelectasis or old disease, no mention of pericardial effusion. Initial CXR NAD, then CXR today with central vascular congestion without convincing pulm edema, mild basilar atx, + cardiomegaly. She was admitted for acute respiratory failure with hypoxia felt due to URI. Shortly after admission she began to develop rising creatinine and hyperkalemia. ARB was held and she was treated with IV fluid gently. Renal US with bilateral cortical thinning. It was suspected related to CIN with ARB induced ATN. She was started on IV Lasix this morning per renal consultation. She also began to worsen with increased  lethargy and hypercarbic respiratory failure with pH of 7.11, PCO2 of 99. She was placed on biPAP and critical care consulted. They feel this is likely a combination of OSA/pulm edema/broncho spasm and added solumedrol and BDs. Most recent labs show BUN 76/Cr 2.52 (was 1.2 on admission), K 5.5. TSH wnl.   Since being on bipap, her mental status has improved. She is now A+O once more. Denies recent fevers/chills. She reports seeing some sense of left chest tightness but is not sure when it started. She states it is barely noticeable now. It is worse when she shifts in bed. Not tender to palpation and not worse with inspiration. 1+ BLE noted on exam. She states this is chronic for her and does not look significantly different (physical exam at [place] 08/2016 reports 1-2 BLEE). Nursing reports only 57ml UOP since arrival to floor. Weight 221 on adm, up to 228 today (was 224 as OP). Received 40mg  IV Lasix at 10:30am -  Has been written to receive additional 80mg  starting this PM. Rhythm stable on telemetry.   Past Medical History:  Diagnosis Date  . Anasarca 04/2009   found to be secondary to pericardial effusion with tamponade/pericardial window done  . Chronic diastolic CHF (congestive heart failure) ([place])   . CKD (chronic kidney disease), stage III   . Coronary artery disease    a. mild-mod CAD 2004.  . Diabetes mellitus    insulin dependent  . Herpes labialis    Healing herpes labialis  . Hypercholesterolemia   . Hypertension   . Lumbar radiculopathy   . Lumbar scoliosis   . Morbid obesity ([place])   . Pericardial effusion  04/2009   a. 2010 with tamponade s/p window.  Marland Kitchen PONV (postoperative nausea and vomiting)    has not had any problems since 2001  . Spondylolisthesis   . Spondylosis       Surgical History:  Past Surgical History:  Procedure Laterality Date  . BACK SURGERY     microdiskectomy L3-4 on L/partial facetectomy 3-4 on L/removal of synovial cyst on left L3-4  . BREAST BIOPSY       left  . CARDIAC CATHETERIZATION  04/25/2003   L heart cath w/coronary angiography/R femoral artery/ EF 65%/no mitral regurgitation/ angiography L main coronary artery smooth & normal  . EYE SURGERY     ophthalmoscopy/peritomy adjacent to the limbus from the 8 to 2:30 position superiorly  . lumbar laminectomy    . SUBXYPHOID PERICARDIAL WINDOW  04/2009   for pericardial effusion w/pericardial tamponade/sac drained w/20-French Baard drain/transesophageal echocardiogram confimed complete evacution of pericardial fluid  . TOTAL HIP ARTHROPLASTY  03/06/2012   Procedure: TOTAL HIP ARTHROPLASTY;  Surgeon: Kerin Salen, MD;  Location: [place];  Service: Orthopedics;  Laterality: Left;  DEPUY/PINNACLE, SUMMIT STEM, CUP POLY & CERAMIC  . VAGINAL HYSTERECTOMY       Home Meds: Prior to Admission medications   Medication Sig Start Date End Date Taking? Authorizing Provider  amLODipine (NORVASC) 10 MG tablet Take 10 mg by mouth daily.    Yes Historical Provider, MD  aspirin EC 81 MG tablet Take 81 mg by mouth every morning.   Yes Historical Provider, MD  atorvastatin (LIPITOR) 80 MG tablet Take 40 mg by mouth every evening.   Yes Historical Provider, MD  ergocalciferol (VITAMIN D2) 50000 UNITS capsule Take 50,000 Units by mouth once a week. On friday 07/30/11  Yes Historical Provider, MD  furosemide (LASIX) 80 MG tablet Take 1 tablet (80 mg total) by mouth daily. 09/09/16  Yes Thayer Headings, MD  gabapentin (NEURONTIN) 300 MG capsule 2 TABLETS BY MOUTH AT BEDTIME 08/06/16  Yes Thayer Headings, MD  GLIPIZIDE XL 5 MG 24 hr tablet Take 5 mg by mouth Daily. 06/12/12  Yes Historical Provider, MD  guaiFENesin (ROBITUSSIN) 100 MG/5ML liquid Take 5-10 mLs (100-200 mg total) by mouth every 4 (four) hours as needed for cough. 08/14/15  Yes Tatyana Kirichenko, PA-C  insulin aspart (NOVOLOG) 100 UNIT/ML injection Inject into the skin as directed. Based on a sliding scale. Inject 3-4 units into the skin daily with  breakfast, inject 3-4 units into the skin with lunch. Inject 8 units into the skin daily with dinner.   Yes Historical Provider, MD  insulin glargine (LANTUS) 100 UNIT/ML injection Inject 20 Units into the skin at bedtime.    Yes Historical Provider, MD  metoprolol tartrate (LOPRESSOR) 25 MG tablet Take 25 mg by mouth 2 (two) times daily.   Yes Historical Provider, MD  pioglitazone (ACTOS) 15 MG tablet Take 15 mg by mouth daily.   Yes Historical Provider, MD  Polyethyl Glycol-Propyl Glycol (SYSTANE) 0.4-0.3 % SOLN Apply 2 drops to eye daily as needed.   Yes Historical Provider, MD  valsartan (DIOVAN) 320 MG tablet Take 320 mg by mouth daily.    Yes Historical Provider, MD  albuterol (PROVENTIL HFA;VENTOLIN HFA) 108 (90 BASE) MCG/ACT inhaler Inhale 2 puffs into the lungs every 4 (four) hours as needed for wheezing or shortness of breath. Patient not taking: Reported on 10/05/2016 08/14/15   Tatyana Kirichenko, PA-C  IRON PO Take 1 tablet by mouth daily.  Historical Provider, MD    Inpatient Medications:  . amLODipine  10 mg Oral Daily  . aspirin EC  81 mg Oral BH-q7a  . atorvastatin  40 mg Oral QPM  . azithromycin  500 mg Intravenous Q24H  . cefTRIAXone (ROCEPHIN)  IV  1 g Intravenous Q24H  . fluticasone  2 spray Each Nare Daily  . furosemide  80 mg Intravenous BID  . gabapentin  600 mg Oral QHS  . heparin  5,000 Units Subcutaneous Q8H  . insulin aspart  0-9 Units Subcutaneous TID WC  . insulin glargine  10 Units Subcutaneous QHS  . ipratropium-albuterol  3 mL Nebulization Q6H  . methylPREDNISolone (SOLU-MEDROL) injection  40 mg Intravenous Q8H  . metoprolol tartrate  25 mg Oral BID     Allergies:  Allergies  Allergen Reactions  . Codeine Nausea And Vomiting    Hydrocodone is ok  . Lactose Intolerance (Gi) Diarrhea  . Lodine [Etodolac] Other (See Comments)    dizziness  . Percocet [Oxycodone-Acetaminophen] Other (See Comments)    Percocet does not relieve pts' pain.   . Sulfa  Drugs Cross Reactors Nausea Only  . Sulfamethoxazole Nausea And Vomiting    unknown    Social History   Social History  . Marital status: Married    Spouse name: N/A  . Number of children: N/A  . Years of education: N/A   Occupational History  . Not on file.   Social History Main Topics  . Smoking status: Never Smoker  . Smokeless tobacco: Never Used  . Alcohol use No  . Drug use: No  . Sexual activity: Not on file   Other Topics Concern  . Not on file   Social History Narrative  . No narrative on file     Family History  Problem Relation Age of Onset  . Heart attack Father   . Hypertension Father   . Stroke Mother   . Angina Mother   . Coronary artery disease       Review of Systems:no fevers or chills. All other systems reviewed and are otherwise negative except as noted above.  Labs:  Recent Labs  10/08/16 1055  CKTOTAL 210   Lab Results  Component Value Date   WBC 5.9 10/06/2016   HGB 11.7 (L) 10/06/2016   HCT 38.5 10/06/2016   MCV 89.3 10/06/2016   PLT 204 10/06/2016    Recent Labs Lab 10/06/16 0536  10/08/16 1055  NA 138  < > 135  K 4.5  < > 5.5*  CL 100*  < > 98*  CO2 28  < > 33*  BUN 36*  < > 76*  CREATININE 1.59*  < > 2.52*  CALCIUM 8.7*  < > 8.5*  PROT 6.0*  --   --   BILITOT 0.5  --   --   ALKPHOS 75  --   --   ALT 16  --   --   AST 20  --   --   GLUCOSE 293*  < > 171*  < > = values in this interval not displayed. Lab Results  Component Value Date   CHOL  04/21/2009    85        ATP III CLASSIFICATION:  <200     mg/dL   Desirable  200-239  mg/dL   Borderline High  >=240    mg/dL   High          HDL 27 (L) 04/21/2009   [place]  04/21/2009    39        Total Cholesterol/HDL:CHD Risk Coronary Heart Disease Risk Table                     Men   Women  1/2 Average Risk   3.4   3.3  Average Risk       5.0   4.4  2 X Average Risk   9.6   7.1  3 X Average Risk  23.4   11.0        Use the calculated Patient Ratio above  and the CHD Risk Table to determine the patient's CHD Risk.        ATP III CLASSIFICATION (LDL):  <100     mg/dL   Optimal  100-129  mg/dL   Near or Above                    Optimal  130-159  mg/dL   Borderline  160-189  mg/dL   High  >190     mg/dL   Very High   TRIG 93 04/21/2009   No results found for: DDIMER  Radiology/Studies:  Dg Chest 2 View  Result Date: 10/08/2016 CLINICAL DATA:  Shortness of breath, wheezing EXAM: CHEST  2 VIEW COMPARISON:  10/06/2016 FINDINGS: Cardiomegaly again noted. Central vascular congestion without convincing pulmonary edema. Suboptimal exam due to patient's large body habitus. Mild basilar atelectasis. No segmental infiltrate. Mild degenerative changes thoracic spine. IMPRESSION: Central vascular congestion without convincing pulmonary edema. Mild basilar atelectasis. Cardiomegaly. No segmental infiltrate. Electronically Signed   By: Lahoma Crocker M.D.   On: 10/08/2016 08:27   X-ray Chest Pa And Lateral  Result Date: 10/06/2016 CLINICAL DATA:  Acute respiratory failure.  Shortness of breath. EXAM: CHEST  2 VIEW COMPARISON:  Yesterday. FINDINGS: Interval decreased inspiration and interval enlargement of the cardiac silhouette. Mild increase in prominence of the pulmonary vasculature. Stable mild prominence of the interstitial markings. Thoracic spine degenerative changes. Aortic calcifications. IMPRESSION: 1. Interval mild cardiomegaly, accentuated by a decreased inspiration and the AP technique of the frontal view. 2. Stable mild chronic interstitial lung disease. Electronically Signed   By: Claudie Revering M.D.   On: 10/06/2016 10:26   Dg Chest 2 View  Result Date: 10/05/2016 CLINICAL DATA:  Cough and shortness of breath. EXAM: CHEST  2 VIEW COMPARISON:  August 14, 2015 FINDINGS: The heart size and mediastinal contours are within normal limits. Both lungs are clear. The visualized skeletal structures are unremarkable. IMPRESSION: No active cardiopulmonary  disease. Electronically Signed   By: Dorise Bullion III M.D   On: 10/05/2016 13:06   Ct Angio Chest Pe W/cm &/or Wo Cm  Result Date: 10/05/2016 CLINICAL DATA:  Hypoxia and shortness of breath. EXAM: CT ANGIOGRAPHY CHEST WITH CONTRAST TECHNIQUE: Multidetector CT imaging of the chest was performed using the standard protocol during bolus administration of intravenous contrast. Multiplanar CT image reconstructions and MIPs were obtained to evaluate the vascular anatomy. CONTRAST:  75 mL Isovue 370 COMPARISON:  Chest radiograph 10/05/2016.  Chest CT 04/23/2009 FINDINGS: Cardiovascular: Negative for pulmonary embolism. Coronary artery calcifications. Normal caliber of the thoracic aorta without dissection. Atherosclerotic calcifications in the aorta. There is a bovine arch. Bilateral common carotid arteries are very medial in the lower neck. Main branches of celiac trunk are patent. Mediastinum/Nodes: Small hiatal hernia. Few small mediastinal lymph nodes. Largest node is in the precarinal area measuring 1.1 in the short axis and this previously  measured 1.0 cm. Overall, there is not significant chest lymphadenopathy. No significant pericardial fluid. No significant axillary lymphadenopathy. Mild heterogeneity of the thyroid tissue. Lungs/Pleura: Trachea and mainstem bronchi are patent. Subtle ground-glass densities in the left lower lobe on sequence 5, image 81. Otherwise, the lungs are clear without significant airspace disease or lung consolidation. No pleural fluid. Upper Abdomen: No acute abnormality. Musculoskeletal: Degenerative changes in the lower cervical spine. No acute bone abnormality. Review of the MIP images confirms the above findings. IMPRESSION: Negative for pulmonary embolism. No acute chest abnormality. Subtle ground-glass densities in the left lower lobe are nonspecific but could represent atelectasis or old disease. Small hiatal hernia. Electronically Signed   By: Markus Daft M.D.   On:  10/05/2016 14:48   US Renal  Result Date: 10/08/2016 CLINICAL DATA:  Acute kidney injury. History of hypertension, diabetes, and chronic kidney disease. EXAM: RENAL / URINARY TRACT ULTRASOUND COMPLETE COMPARISON:  CT abdomen and pelvis 04/23/2009 FINDINGS: Right Kidney: Length: 11.1 cm. Mild-to-moderate cortical thinning. Echogenicity within normal limits. No mass or hydronephrosis visualized. Left Kidney: Length: 11.4 cm. Mild cortical thinning. Echogenicity within normal limits. No mass or hydronephrosis visualized. Bladder: Decompressed and not visualized. IMPRESSION: Bilateral renal cortical thinning.  No hydronephrosis. Electronically Signed   By: Logan Bores M.D.   On: 10/08/2016 09:56    Wt Readings from Last 3 Encounters:  10/08/16 227 lb 1.2 oz (103 kg)  08/21/16 224 lb (101.6 kg)  12/18/15 223 lb 1.9 oz (101.2 kg)    EKG: NSR no acute changes  Physical Exam: Blood pressure (!) 140/44, pulse 60, temperature 98.4 F (36.9 C), temperature source Oral, resp. rate 15, height 5\' 5"  (1.651 m), weight 227 lb 1.2 oz (103 kg), SpO2 90 %. Body mass index is 37.79 kg/m. General: Well developed, well nourished obese WF, in no acute distress. Head: Normocephalic, atraumatic, sclera non-icteric, no xanthomas, nares are without discharge.  Neck: Negative for carotid bruits. JVD not elevated. Lungs: Diminished BS bilaterally. Breathing is unlabored. Heart: RRR with S1 S2. No murmurs, rubs, or gallops appreciated. Abdomen: Soft, non-tender, non-distended with normoactive bowel sounds. No hepatomegaly. No rebound/guarding. No obvious abdominal masses. Msk:  Strength and tone appear normal for age. Extremities: No clubbing or cyanosis. 1+ puffy BLE edema, symmetrical.  Distal pedal pulses are 2+ and equal bilaterally. Neuro: Alert and oriented X 3. No facial asymmetry. No focal deficit. Moves all extremities spontaneously. Psych:  Responds to questions appropriately with a normal affect.      Assessment and Plan  [id number] chronic diastolic CHF, pericardial effusion s/p window 2010, DM, mild-mod CAD 2004, HTN, hyperlipidemia, CKD stage III (baseline was 1.3 in 2016), obesity, scoliosis/radiculopathy admitted with suspected bronchitis. Had CTA that r/o PE but afterwards developed AKI suspected due to ATN, then worsening respiratory failure requiring bipap with hypercarbia and acute encephalopathy.   1. Acute hypoxic/hypercarbic respiratory failure 2. Acute on chronic diastolic CHF in the setting of acute renal failure 3. Essential HTN, relatively controlled 4. Chest pain, somewhat atypical with known history of CAD 5. H/o pericardial effusion s/p window 2010 without evidence for recurrence on bedside echo per Dr. Johnsie Cancel 6. Acute encephalopathy, improved, likely due to hypercarbia   Patient seen/examined with Dr. Johnsie Cancel. Biggest issue at present time is oliguria and acute renal insufficiency. Renal is on board and per d/w nurse, PCCM is aware of minimal UOP. Patient is scheduled to receive additional Lasix shortly. If UOP does not pick up may need to consider alternative methods  of fluid removal. Will f/u EKG for chest pain but doubt plans for ischemic workup right now with elevated Cr and acute issues. EF remains normal per Dr. Kyla Balzarine review at bedside and rhythm is stable. See below for additional thoughts.  Signed, Charlie Pitter PA-C 10/08/2016, 5:21 PM Pager: [phone]  Patient examined chart reviewed. Don't see active cardiac issue. No chest pain CPK negative ECG with no acute chages Bedside echo done while I was at bedside and EF is totally normal 65% RV is moderately dilated. Recent hypercapnic respiratory failure with CO2 99 and oliguric renal failure possibly ppt by contrast for CTA PE study was negative Issue primarily Appears to be oliguric renal failure and hypercapnic respiratory failure Exam with obese female with poor inspiratory effort on CPAP decreased BS bases and  normal heart sounds Trace LE edema  Jenkins Rouge

## 2016-10-08 NOTE — Plan of Care (Cosign Needed)
  Interdisciplinary Goals of Care Family Meeting   Date carried out:: 10/08/2016  Location of the meeting: Bedside  Member's involved: Nurse Practitioner, Bedside Registered Nurse and Family Member or next of kin  Durable Power of Attorney or acting medical decision maker:  [name] - youngest son and decision maker. Older son defers to him.  Phone # [phone].  Discussion: We discussed goals of care for [name] .  Discussed plan of care for [name]. Both son's present at bedside for conversation.  They report the patient has a living will that states she "desires a natural death".  Husband has fairly advanced dementia and is unable to make medical decisions for the patient.  Family requests that Cardiology see her as they have a longstanding relationship with her.  We discussed the concept of CPR, intubation / mechanical ventilation in detail.  They understand that the act of CPR would likely require intubation and they would not want CPR performed.  However, they would want intubation for a short term to allow for possible recovery if illness is deemed a reversible process.  The patient's living will also states she would want a second opinion if it were to come to withdrawal of care.  Family feels as though they would not have an issue making the decision to withdraw care if she were to decline.    Code status: Limited Code or DNR with short term  Disposition: Continue current acute care  Time spent for the meeting: 61 minutes     Laurie Gens, NP-C [place] Pgr: [phone] or if no answer [phone] 10/08/2016, 4:29 PM

## 2016-10-08 NOTE — Progress Notes (Signed)
Patient appeared sleepy this morning upon shift change. Dr. Mikhail came to the patients bedside and assessed the patient. She then stepped outside of the room and we discussed the patients plan of care. She stated that she would consult pulmonology and nephrology and continue to monitor the patient. At approximately 1045 I received a critical lab value on the patient regarding her ABG that was just drawn. ABG results include: pH=7.11, Po2=76.9, Co2= 99.3, and HCO3=30.4. I then contacted Dr. Mikhail regarding these results and she stated that pulmonology would be seeing the patient. I then met the respiratory therapist outside of the patients room and she stated that based on the patients ABG results, she meets requirement for intubation. I then went to my charge nurse for assistance contacting the pulmonologist. He instructed me to contact rapid response. The rapid response nurse along with a critical care doctor was at the patients bedside when my charge nurse and I arrived. The rapid response nurse called for a ICU bed. The respiratory therapist then placed the patient on BiPAP. I called the nurse receiving the patient on [address] and gave report. I stayed at the patients bedside and transferred her to [address] along with the respiratory therapist and a nurse tech.  

## 2016-10-08 NOTE — Progress Notes (Signed)
RT note:  Patient taken off of Bipap and placed on 2L [place] per discussion with P. Hoffman NP.  Goal is to rest for ~1 hour before going back on.  Discussed plan with patient and son at bedside, who verbalized understanding.  Pt. Comfortable on [place].  RR: 14 and non-labored.  SpO2: 96%  RT will continue to monitor.

## 2016-10-08 NOTE — Consult Note (Signed)
Reason for Consult:AKI/CKD Referring Physician: Dr. Carmelina Peal is an 82 y.o. female.  HPI: [name] has a PMH significant for DM, HTN, COPD, CAD, and CKD stage 3 who was admitted on 10/05/16 with worsening SOB despite antibiotics and steroids.  She was found to have significant hypercarbia (PCO2 99) and respiratory acidosis (pH 7.11) and PCCM was consulted to transfer her to ICU and placed on NIMVS.  We were consulted to further evaluate her AKI/CKD.  The trend in Scr is seen below.  She did receive one dose of ARB while an inpatient and had a CT angiogram with contrast on 10/05/16 which may accound for the rising Scr since the procedure.  Her ARB has been on hold since 10/06/16.  No documented episodes of hypotension and renal US without obstruction.  She is currently on the [facility] and is unresponsive.  History is obtained through searching her medical records.   Trend in Creatinine: Creatinine, Ser  Date/Time Value Ref Range Status  10/08/2016 05:15 AM 2.32 (H) 0.44 - 1.00 mg/dL Final  10/07/2016 05:48 AM 2.01 (H) 0.44 - 1.00 mg/dL Final  10/06/2016 05:36 AM 1.59 (H) 0.44 - 1.00 mg/dL Final  10/05/2016 12:01 PM 1.20 (H) 0.44 - 1.00 mg/dL Final  08/14/2015 11:00 AM 1.33 (H) 0.44 - 1.00 mg/dL Final  03/07/2012 05:44 AM 1.16 (H) 0.50 - 1.10 mg/dL Final  03/04/2012 02:33 PM 1.35 (H) 0.50 - 1.10 mg/dL Final  05/04/2011 06:00 AM 1.24 (H) 0.50 - 1.10 mg/dL Final  04/29/2011 10:14 AM 1.43 (H) 0.50 - 1.10 mg/dL Final  05/01/2009 04:15 AM 1.80 (H) 0.4 - 1.2 mg/dL Final  04/30/2009 03:40 AM 1.52 (H) 0.4 - 1.2 mg/dL Final  04/29/2009 03:35 AM 1.35 (H) 0.4 - 1.2 mg/dL Final  04/28/2009 04:10 AM 1.43 (H) 0.4 - 1.2 mg/dL Final  04/27/2009 05:45 AM 1.32 (H) 0.4 - 1.2 mg/dL Final  04/26/2009 05:50 AM 1.43 (H) 0.4 - 1.2 mg/dL Final  04/24/2009 03:30 AM 1.82 (H) 0.4 - 1.2 mg/dL Final  04/23/2009 04:11 AM 1.92 (H) 0.4 - 1.2 mg/dL Final  04/22/2009 03:55 AM 1.64 (H) 0.4 - 1.2 mg/dL Final   04/21/2009 06:20 AM 1.65 (H) 0.4 - 1.2 mg/dL Final  04/20/2009 05:45 PM 1.7 (H) 0.4 - 1.2 mg/dL Final  03/10/2009 06:18 AM 1.7 (H) 0.4 - 1.2 mg/dL Final    PMH:   Past Medical History:  Diagnosis Date  . Anasarca 04/2009   found to be secondary to pericardial effusion with tamponade/pericardial window done  . Bladder infection    pt has been on antibiotic .for this  this week .   Marland Kitchen Chronic kidney disease   . Coronary artery disease   . Diabetes mellitus    insulin dependent  . Diastolic congestive heart failure (HCC)     Chronic diastolic congestive heart failure  . Dyspnea   . Herpes labialis    Healing herpes labialis  . Hypercholesterolemia   . Hypertension   . Lumbar radiculopathy   . Lumbar scoliosis   . Morbid obesity ([place])   . Pericardial effusion  04/2009    with tamponade  . PONV (postoperative nausea and vomiting)    has not had any problems since 2001  . Shortness of breath  04/20/2009  . Spondylolisthesis   . Spondylosis     PSH:   Past Surgical History:  Procedure Laterality Date  . BACK SURGERY     microdiskectomy L3-4 on L/partial facetectomy 3-4 on L/removal of  synovial cyst on left L3-4  . BREAST BIOPSY     left  . CARDIAC CATHETERIZATION  04/25/2003   L heart cath w/coronary angiography/R femoral artery/ EF 65%/no mitral regurgitation/ angiography L main coronary artery smooth & normal  . EYE SURGERY     ophthalmoscopy/peritomy adjacent to the limbus from the 8 to 2:30 position superiorly  . lumbar laminectomy    . SUBXYPHOID PERICARDIAL WINDOW  04/2009   for pericardial effusion w/pericardial tamponade/sac drained w/20-French Baard drain/transesophageal echocardiogram confimed complete evacution of pericardial fluid  . TOTAL HIP ARTHROPLASTY  03/06/2012   Procedure: TOTAL HIP ARTHROPLASTY;  Surgeon: Kerin Salen, MD;  Location: [place] [facility];  Service: Orthopedics;  Laterality: Left;  DEPUY/PINNACLE, SUMMIT STEM, CUP POLY & CERAMIC  . VAGINAL  HYSTERECTOMY      Allergies:  Allergies  Allergen Reactions  . Codeine Nausea And Vomiting    Hydrocodone is ok  . Lactose Intolerance (Gi) Diarrhea  . Lodine [Etodolac] Other (See Comments)    dizziness  . Percocet [Oxycodone-Acetaminophen] Other (See Comments)    Percocet does not relieve pts' pain.   . Sulfa Drugs Cross Reactors Nausea Only  . Sulfamethoxazole Nausea And Vomiting    unknown    Medications:   Prior to Admission medications   Medication Sig Start Date End Date Taking? Authorizing Provider  amLODipine (NORVASC) 10 MG tablet Take 10 mg by mouth daily.    Yes Historical Provider, MD  aspirin EC 81 MG tablet Take 81 mg by mouth every morning.   Yes Historical Provider, MD  atorvastatin (LIPITOR) 80 MG tablet Take 40 mg by mouth every evening.   Yes Historical Provider, MD  ergocalciferol (VITAMIN D2) 50000 UNITS capsule Take 50,000 Units by mouth once a week. On friday 07/30/11  Yes Historical Provider, MD  furosemide (LASIX) 80 MG tablet Take 1 tablet (80 mg total) by mouth daily. 09/09/16  Yes Thayer Headings, MD  gabapentin (NEURONTIN) 300 MG capsule 2 TABLETS BY MOUTH AT BEDTIME 08/06/16  Yes Thayer Headings, MD  GLIPIZIDE XL 5 MG 24 hr tablet Take 5 mg by mouth Daily. 06/12/12  Yes Historical Provider, MD  guaiFENesin (ROBITUSSIN) 100 MG/5ML liquid Take 5-10 mLs (100-200 mg total) by mouth every 4 (four) hours as needed for cough. 08/14/15  Yes Tatyana Kirichenko, PA-C  insulin aspart (NOVOLOG) 100 UNIT/ML injection Inject into the skin as directed. Based on a sliding scale. Inject 3-4 units into the skin daily with breakfast, inject 3-4 units into the skin with lunch. Inject 8 units into the skin daily with dinner.   Yes Historical Provider, MD  insulin glargine (LANTUS) 100 UNIT/ML injection Inject 20 Units into the skin at bedtime.    Yes Historical Provider, MD  metoprolol tartrate (LOPRESSOR) 25 MG tablet Take 25 mg by mouth 2 (two) times daily.   Yes Historical  Provider, MD  pioglitazone (ACTOS) 15 MG tablet Take 15 mg by mouth daily.   Yes Historical Provider, MD  Polyethyl Glycol-Propyl Glycol (SYSTANE) 0.4-0.3 % SOLN Apply 2 drops to eye daily as needed.   Yes Historical Provider, MD  valsartan (DIOVAN) 320 MG tablet Take 320 mg by mouth daily.    Yes Historical Provider, MD  albuterol (PROVENTIL HFA;VENTOLIN HFA) 108 (90 BASE) MCG/ACT inhaler Inhale 2 puffs into the lungs every 4 (four) hours as needed for wheezing or shortness of breath. Patient not taking: Reported on 10/05/2016 08/14/15   Tatyana Kirichenko, PA-C  IRON PO Take 1 tablet by  mouth daily.     Historical Provider, MD    Inpatient medications: . albuterol  2.5 mg Nebulization Q4H  . amLODipine  10 mg Oral Daily  . aspirin EC  81 mg Oral BH-q7a  . atorvastatin  40 mg Oral QPM  . azithromycin  500 mg Intravenous Q24H  . cefTRIAXone (ROCEPHIN)  IV  1 g Intravenous Q24H  . fluticasone  2 spray Each Nare Daily  . gabapentin  600 mg Oral QHS  . heparin  5,000 Units Subcutaneous Q8H  . insulin aspart  0-9 Units Subcutaneous TID WC  . insulin glargine  10 Units Subcutaneous QHS  . methylPREDNISolone (SOLU-MEDROL) injection  60 mg Intravenous Q12H  . metoprolol tartrate  25 mg Oral BID    Discontinued Meds:   Medications Discontinued During This Encounter  Medication Reason  . ipratropium-albuterol (DUONEB) 0.5-2.5 (3) MG/3ML nebulizer solution 3 mL   . furosemide (LASIX) tablet 80 mg   . albuterol (PROVENTIL) (2.5 MG/3ML) 0.083% nebulizer solution 2.5 mg   . irbesartan (AVAPRO) tablet 300 mg   . 0.9 %  sodium chloride infusion   . doxycycline (VIBRA-TABS) tablet 100 mg     Social History:  reports that she has never smoked. She has never used smokeless tobacco. She reports that she does not drink alcohol or use drugs.  Family History:   Family History  Problem Relation Age of Onset  . Heart attack Father   . Hypertension Father   . Stroke Mother   . Angina Mother   .  Coronary artery disease      Review of systems not obtained due to patient factors. Weight change: 0.3 kg (10.6 oz)  Intake/Output Summary (Last 24 hours) at 10/08/16 0929 Last data filed at 10/08/16 0825  Gross per 24 hour  Intake          1353.75 ml  Output              200 ml  Net          1153.75 ml   BP (!) 134/55 (BP Location: Right Arm)   Pulse 66   Temp 98.2 F (36.8 C) (Oral)   Resp 19   Ht 5\' 4"  (1.626 m)   Wt 103.8 kg (228 lb 13.4 oz)   SpO2 96%   BMI 39.28 kg/m  Vitals:   10/07/16 1214 10/07/16 1742 10/07/16 2113 10/08/16 0531  BP:  (!) 125/51 (!) 142/58 (!) 134/55  Pulse:  66 72 66  Resp:  17 18 19   Temp:  97.5 F (36.4 C) 98.2 F (36.8 C) 98.2 F (36.8 C)  TempSrc:  Oral Oral Oral  SpO2: 98% 94% 97% 96%  Weight:   103.8 kg (228 lb 13.4 oz)   Height:         General appearance: mildly obese, pale and unresponsive wearing NIVMS Head: Normocephalic, without obvious abnormality, atraumatic Resp: rhonchi bilaterally and wheezes bilaterally Cardio: no rub and faint HS GI: soft, non-tender; bowel sounds normal; no masses,  no organomegaly Extremities: edema 1+  Labs: Basic Metabolic Panel:  Recent Labs Lab 10/05/16 1201 10/06/16 0536 10/07/16 0548 10/08/16 0515  NA 139 138 134* 133*  K 4.7 4.5 5.2* 5.5*  CL 103 100* 98* 95*  CO2 28 28 25 30   GLUCOSE 216* 293* 239* 175*  BUN 21* 36* 54* 70*  CREATININE 1.20* 1.59* 2.01* 2.32*  ALBUMIN 3.6 3.1*  --   --   CALCIUM 8.8* 8.7* 8.7* 8.5*  Liver Function Tests:  Recent Labs Lab 10/05/16 1201 10/06/16 0536  AST 22 20  ALT 17 16  ALKPHOS 78 75  BILITOT 0.4 0.5  PROT 6.7 6.0*  ALBUMIN 3.6 3.1*   No results for input(s): LIPASE, AMYLASE in the last 168 hours. No results for input(s): AMMONIA in the last 168 hours. CBC:  Recent Labs Lab 10/05/16 1201 10/06/16 0536  WBC 6.1 5.9  NEUTROABS 4.8  --   HGB 13.0 11.7*  HCT 42.7 38.5  MCV 89.3 89.3  PLT 185 204    PT/INR: @LABRCNTIP (inr:5) Cardiac Enzymes: )No results for input(s): CKTOTAL, CKMB, CKMBINDEX, TROPONINI in the last 168 hours. CBG:  Recent Labs Lab 10/07/16 0759 10/07/16 1216 10/07/16 1654 10/07/16 2110 10/08/16 0807  GLUCAP 263* 274* 221* 184* 151*    Iron Studies: No results for input(s): IRON, TIBC, TRANSFERRIN, FERRITIN in the last 168 hours.  Xrays/Other Studies: Dg Chest 2 View  Result Date: 10/08/2016 CLINICAL DATA:  Shortness of breath, wheezing EXAM: CHEST  2 VIEW COMPARISON:  10/06/2016 FINDINGS: Cardiomegaly again noted. Central vascular congestion without convincing pulmonary edema. Suboptimal exam due to patient's large body habitus. Mild basilar atelectasis. No segmental infiltrate. Mild degenerative changes thoracic spine. IMPRESSION: Central vascular congestion without convincing pulmonary edema. Mild basilar atelectasis. Cardiomegaly. No segmental infiltrate. Electronically Signed   By: Lahoma Crocker M.D.   On: 10/08/2016 08:27     Assessment/Plan: 1.  AKI/CKD likely related to contrast-induced nephropathy along with ARB induced ATN as her Scr started rising after the IV contrast exposure.  ARB on hold.  Would dose with IV lasix due to hyperkalemia and volume overload.  Continue to follow 2. Hypercarbic respiratory failure- PCCM consulted and currently on NIVMS but may need intubation given poor mental status.   3. Hyperkalemia- due to #1 4. CHF- as above start IV lasix 5. DM- per primary 6. Disposition- agree with Palliative care evaluation   [name] 10/08/2016, 9:29 AM

## 2016-10-08 NOTE — Progress Notes (Signed)
  CRITICAL VALUE ALERT  Critical value received: ABG: pH=7.11, Po2=76.9, Co2=99.3, HCO3=30.4.  Date of notification:  10/08/16  Time of notification:  1040  Critical value read back: yes  Nurse who received alert:  Dixie Dials  MD notified (1st page):  Mikhail  Time of first page:  1054  MD notified (2nd page):  Time of second page:  Responding MD:  Ree Kida  Time MD responded:  [id number]  No further orders given at this time. Will continue to monitor.

## 2016-10-08 NOTE — Progress Notes (Signed)
  Echocardiogram 2D Echocardiogram has been performed.  [name] 10/08/2016, 5:55 PM

## 2016-10-08 NOTE — Consult Note (Signed)
PULMONARY / CRITICAL CARE MEDICINE   Name: [name] MRN: [id number] DOB: November 27, 1930    ADMISSION DATE:  10/05/2016 CONSULTATION DATE: 12/5  REFERRING MD:  [facility]  CHIEF COMPLAINT:  Coughing  HISTORY OF PRESENT ILLNESS:   MO(238 lbs) never smoker(but 2nd hand exposure) PMH of DM,bronchitis, HTN,CAD(no recent 2d) HTN who was admitted 12/2 for cough(white sputum)increased sob. Despite abx and steroids her SOB worsened and Pco2 was (99 and PH 7.11). Remarkably she is awake and alert and in NAD. PCCM was consulted 12/5 and will move her to ICU as she is a full code. Note her bicarb is elevated and her renal function has worsened. We will use NIMVS, check repeat abg,check 2 d , arrestively diuresis. Renal US odered for completeness.  PAST MEDICAL HISTORY :  She  has a past medical history of Anasarca (04/2009); Bladder infection; Chronic kidney disease; Coronary artery disease; Diabetes mellitus; Diastolic congestive heart failure ([place]); Dyspnea; Herpes labialis; Hypercholesterolemia; Hypertension; Lumbar radiculopathy; Lumbar scoliosis; Morbid obesity ([place]); Pericardial effusion ( 04/2009 ); PONV (postoperative nausea and vomiting); Shortness of breath ( 04/20/2009); Spondylolisthesis; and Spondylosis.  PAST SURGICAL HISTORY: She  has a past surgical history that includes Subxyphoid pericardial window (04/2009); lumbar laminectomy; Cardiac catheterization (04/25/2003); Vaginal hysterectomy; Breast biopsy; Back surgery; Eye surgery; and Total hip arthroplasty (03/06/2012).  Allergies  Allergen Reactions  . Codeine Nausea And Vomiting    Hydrocodone is ok  . Lactose Intolerance (Gi) Diarrhea  . Lodine [Etodolac] Other (See Comments)    dizziness  . Percocet [Oxycodone-Acetaminophen] Other (See Comments)    Percocet does not relieve pts' pain.   . Sulfa Drugs Cross Reactors Nausea Only  . Sulfamethoxazole Nausea And Vomiting    unknown    No current facility-administered medications on file  prior to encounter.    Current Outpatient Prescriptions on File Prior to Encounter  Medication Sig  . amLODipine (NORVASC) 10 MG tablet Take 10 mg by mouth daily.   Marland Kitchen atorvastatin (LIPITOR) 80 MG tablet Take 40 mg by mouth every evening.  . ergocalciferol (VITAMIN D2) 50000 UNITS capsule Take 50,000 Units by mouth once a week. On friday  . furosemide (LASIX) 80 MG tablet Take 1 tablet (80 mg total) by mouth daily.  Marland Kitchen gabapentin (NEURONTIN) 300 MG capsule 2 TABLETS BY MOUTH AT BEDTIME  . GLIPIZIDE XL 5 MG 24 hr tablet Take 5 mg by mouth Daily.  Marland Kitchen guaiFENesin (ROBITUSSIN) 100 MG/5ML liquid Take 5-10 mLs (100-200 mg total) by mouth every 4 (four) hours as needed for cough.  . insulin aspart (NOVOLOG) 100 UNIT/ML injection Inject into the skin as directed. Based on a sliding scale. Inject 3-4 units into the skin daily with breakfast, inject 3-4 units into the skin with lunch. Inject 8 units into the skin daily with dinner.  . insulin glargine (LANTUS) 100 UNIT/ML injection Inject 20 Units into the skin at bedtime.   . metoprolol tartrate (LOPRESSOR) 25 MG tablet Take 25 mg by mouth 2 (two) times daily.  . pioglitazone (ACTOS) 15 MG tablet Take 15 mg by mouth daily.  . valsartan (DIOVAN) 320 MG tablet Take 320 mg by mouth daily.   Marland Kitchen albuterol (PROVENTIL HFA;VENTOLIN HFA) 108 (90 BASE) MCG/ACT inhaler Inhale 2 puffs into the lungs every 4 (four) hours as needed for wheezing or shortness of breath. (Patient not taking: Reported on 10/05/2016)  . IRON PO Take 1 tablet by mouth daily.     FAMILY HISTORY:  Her @FAMSTP (<SUBSCRIPT> error)@  SOCIAL HISTORY:  She  reports that she has never smoked. She has never used smokeless tobacco. She reports that she does not drink alcohol or use drugs.  REVIEW OF SYSTEMS:   10 point review of system taken, please see HPI for positives and negatives.   SUBJECTIVE:  Awake and alert  VITAL SIGNS: BP (!) 120/102 (BP Location: Left Arm)   Pulse 61   Temp 98.4  F (36.9 C) (Oral)   Resp 18   Ht 5\' 4"  (1.626 m)   Wt 228 lb 13.4 oz (103.8 kg)   SpO2 97%   BMI 39.28 kg/m   HEMODYNAMICS:    VENTILATOR SETTINGS:    INTAKE / OUTPUT: I/O last 3 completed shifts: In: 1833.8 [P.O.:960; I.V.:573.8; IV Piggyback:300] Out: 450 [Urine:450]  PHYSICAL EXAMINATION: General:  MOWF wide awake Neuro:  Intact HEENT:  +VCD, no neck Cardiovascular:  HSR RRR Lungs: + exp wheezes, decreased bs bases Abdomen: Obese , soft +bs Musculoskeletal:  Intact Skin:  Warm and dry ++le edema  LABS:  BMET  Recent Labs Lab 10/06/16 0536 10/07/16 0548 10/08/16 0515  NA 138 134* 133*  K 4.5 5.2* 5.5*  CL 100* 98* 95*  CO2 28 25 30   BUN 36* 54* 70*  CREATININE 1.59* 2.01* 2.32*  GLUCOSE 293* 239* 175*    Electrolytes  Recent Labs Lab 10/06/16 0536 10/07/16 0548 10/08/16 0515  CALCIUM 8.7* 8.7* 8.5*    CBC  Recent Labs Lab 10/05/16 1201 10/06/16 0536  WBC 6.1 5.9  HGB 13.0 11.7*  HCT 42.7 38.5  PLT 185 204    Coag's No results for input(s): APTT, INR in the last 168 hours.  Sepsis Markers No results for input(s): LATICACIDVEN, PROCALCITON, O2SATVEN in the last 168 hours.  ABG  Recent Labs Lab 10/08/16 1028  PHART 7.112*  PCO2ART 99.3*  PO2ART 76.9*    Liver Enzymes  Recent Labs Lab 10/05/16 1201 10/06/16 0536  AST 22 20  ALT 17 16  ALKPHOS 78 75  BILITOT 0.4 0.5  ALBUMIN 3.6 3.1*    Cardiac Enzymes No results for input(s): TROPONINI, PROBNP in the last 168 hours.  Glucose  Recent Labs Lab 10/06/16 2117 10/07/16 0759 10/07/16 1216 10/07/16 1654 10/07/16 2110 10/08/16 0807  GLUCAP 191* 263* 274* 221* 184* 151*    Imaging Dg Chest 2 View  Result Date: 10/08/2016 CLINICAL DATA:  Shortness of breath, wheezing EXAM: CHEST  2 VIEW COMPARISON:  10/06/2016 FINDINGS: Cardiomegaly again noted. Central vascular congestion without convincing pulmonary edema. Suboptimal exam due to patient's large body habitus.  Mild basilar atelectasis. No segmental infiltrate. Mild degenerative changes thoracic spine. IMPRESSION: Central vascular congestion without convincing pulmonary edema. Mild basilar atelectasis. Cardiomegaly. No segmental infiltrate. Electronically Signed   By: Lahoma Crocker M.D.   On: 10/08/2016 08:27   US Renal  Result Date: 10/08/2016 CLINICAL DATA:  Acute kidney injury. History of hypertension, diabetes, and chronic kidney disease. EXAM: RENAL / URINARY TRACT ULTRASOUND COMPLETE COMPARISON:  CT abdomen and pelvis 04/23/2009 FINDINGS: Right Kidney: Length: 11.1 cm. Mild-to-moderate cortical thinning. Echogenicity within normal limits. No mass or hydronephrosis visualized. Left Kidney: Length: 11.4 cm. Mild cortical thinning. Echogenicity within normal limits. No mass or hydronephrosis visualized. Bladder: Decompressed and not visualized. IMPRESSION: Bilateral renal cortical thinning.  No hydronephrosis. Electronically Signed   By: Logan Bores M.D.   On: 10/08/2016 09:56     STUDIES:  12/5 2d>> 12/5 renal US>>  CULTURES: 12/4 sputum>> 12/4 uc>> Ecoli>>  ANTIBIOTICS: 12/4 roc>> 12/4 zithro>>  SIGNIFICANT EVENTS: 12/5 moved to ICU  LINES/TUBES:   DISCUSSION: MO(238 lbs) never smoker(but 2nd hand exposure) PMH of DM,bronchitis, HTN,CAD(no recent 2d) HTN who was admitted 12/2 for cough(white sputum)increased sob. Despite abx and steroids her SOB worsened and Pco2 was (99 and PH 7.11). Remarkably she is awake and alert and in NAD. PCCM was consulted 12/5 and will move her to ICU as she is a full code. Note her bicarb is elevated and her renal function has worsened. We will use NIMVS, check repeat abg,check 2 d , arrestively diuresis. Renal US odered for completeness.  ASSESSMENT / PLAN:  PULMONARY A: Recent bronchitis Chronic CO2 retention made worse by renal failure Bronchospasm VCD component P:   Steroids(monitor glucose in DM) BD's O2 as needed for sats 88% NIMVS to blow Pco2  down and normalize PH(may be difficult with renal failure)  CARDIOVASCULAR A:  No recent 2 d Suspected volume overload with cardiac component  RO PHTN P:  Diuresis 2 d ordered  RENAL Lab Results  Component Value Date   CREATININE 2.32 (H) 10/08/2016   CREATININE 2.01 (H) 10/07/2016   CREATININE 1.59 (H) 10/06/2016    Recent Labs Lab 10/06/16 0536 10/07/16 0548 10/08/16 0515  K 4.5 5.2* 5.5*     A:   Worsening renal failure Hyperkalemia P:   Lasix as tolerated Renal US for completeness May need renal consult  GASTROINTESTINAL A:   MO GI protection P:   Weight loss PPI  HEMATOLOGIC A:   DVT protection P:  SQ heparin  INFECTIOUS A:   Recent bronchitis  No elevated WBC Afebrile P:   Abx per IM Low threshold to dc abx  ENDOCRINE A:   DM with steroid exacerbation  P:   SSI  NEUROLOGIC A:   Currently intact despite PCO2 of 99 P:   RASS goal:  Monitor in ICU   FAMILY  - Updates: Pt and family updated 12/5  - Inter-disciplinary family meet or Palliative Care meeting due by: day 7  CCT=45 min  Richardson Landry Minor ACNP Maryanna Shape PCCM Pager [phone] till 3 pm If no answer page [phone] 10/08/2016, 11:28 AM  STAFF NOTE: I, Merrie Roof, MD FACP have personally reviewed patient's available data, including medical history, events of note, physical examination and test results as part of my evaluation. I have discussed with resident/NP and other care providers such as pharmacist, RN and RRT. In addition, I personally evaluated patient and elicited key findings of: awake, talking , moderate bronchospasm diffuse insp and exp, upper airway wheezing exp moderate, does not seem to be radiation from neck, obese, ABG back , concerns acute on chornic resp failure, a lot of acute, bicarb 30 and 26 in June, no smoking history, appears to have ohs osa, pcxr with edema, move to icu, likely a combo OSA/ pulm edema/ bronchospasm, solumedrol, bders, lasix, echo,  assess LV , diastolic dz and pa pressures, neg balance goals, start NIMV STAT 10/5, assess volumes and response, abg in 30 min , CT chest was reasonable study to r/o PE, if worsens may need doppler legs and VQ, The patient is critically ill with multiple organ systems failure and requires high complexity decision making for assessment and support, frequent evaluation and titration of therapies, application of advanced monitoring technologies and extensive interpretation of multiple databases.   Critical Care Time devoted to patient care services described in this note is 45 Minutes. This time reflects time of care of this signee: Merrie Roof, MD FACP. This  critical care time does not reflect procedure time, or teaching time or supervisory time of PA/NP/Med student/Med Resident etc but could involve care discussion time. Rest per NP/medical resident whose note is outlined above and that I agree with   Lavon Paganini. Titus Mould, MD, [place] Pgr: [facility] 10/08/2016 12:15 PM

## 2016-10-08 NOTE — Progress Notes (Signed)
Patient transported to room 4N24 without any apparent complications. RT will continue to monitor.

## 2016-10-09 ENCOUNTER — Inpatient Hospital Stay (HOSPITAL_COMMUNITY): Payer: Medicare Other

## 2016-10-09 DIAGNOSIS — I5032 Chronic diastolic (congestive) heart failure: Secondary | ICD-10-CM

## 2016-10-09 DIAGNOSIS — J208 Acute bronchitis due to other specified organisms: Secondary | ICD-10-CM

## 2016-10-09 DIAGNOSIS — J9622 Acute and chronic respiratory failure with hypercapnia: Secondary | ICD-10-CM

## 2016-10-09 LAB — GLUCOSE, CAPILLARY
Glucose-Capillary: 127 mg/dL — ABNORMAL HIGH (ref 65–99)
Glucose-Capillary: 245 mg/dL — ABNORMAL HIGH (ref 65–99)
Glucose-Capillary: 201 mg/dL — ABNORMAL HIGH (ref 65–99)
Glucose-Capillary: 301 mg/dL — ABNORMAL HIGH (ref 65–99)

## 2016-10-09 LAB — RENAL FUNCTION PANEL
Albumin: 3.1 g/dL — ABNORMAL LOW (ref 3.5–5.0)
Anion gap: 10 (ref 5–15)
BUN: 88 mg/dL — ABNORMAL HIGH (ref 6–20)
CO2: 28 mmol/L (ref 22–32)
Calcium: 8.1 mg/dL — ABNORMAL LOW (ref 8.9–10.3)
Chloride: 97 mmol/L — ABNORMAL LOW (ref 101–111)
Creatinine, Ser: 2.53 mg/dL — ABNORMAL HIGH (ref 0.44–1.00)
GFR calc Af Amer: 19 mL/min — ABNORMAL LOW (ref >60)
GFR calc non Af Amer: 16 mL/min — ABNORMAL LOW (ref >60)
Glucose, Bld: 167 mg/dL — ABNORMAL HIGH (ref 65–99)
Phosphorus: 6.6 mg/dL — ABNORMAL HIGH (ref 2.5–4.6)
Potassium: 4.6 mmol/L (ref 3.5–5.1)
Sodium: 135 mmol/L (ref 135–145)

## 2016-10-09 LAB — URINE CULTURE: Culture: 20000 — AB

## 2016-10-09 LAB — CBC
HCT: 36.8 % (ref 36.0–46.0)
Hemoglobin: 11 g/dL — ABNORMAL LOW (ref 12.0–15.0)
MCH: 27 pg (ref 26.0–34.0)
MCHC: 29.9 g/dL — ABNORMAL LOW (ref 30.0–36.0)
MCV: 90.4 fL (ref 78.0–100.0)
Platelets: 170 10*3/uL (ref 150–400)
RBC: 4.07 MIL/uL (ref 3.87–5.11)
RDW: 14.9 % (ref 11.5–15.5)
WBC: 10.7 10*3/uL — ABNORMAL HIGH (ref 4.0–10.5)

## 2016-10-09 LAB — KAPPA/LAMBDA LIGHT CHAINS
Kappa free light chain: 18.3 mg/L (ref 3.3–19.4)
Kappa, lambda light chain ratio: 1.13 (ref 0.26–1.65)
Lambda free light chains: 16.2 mg/L (ref 5.7–26.3)

## 2016-10-09 LAB — POTASSIUM
Potassium: 4.7 mmol/L (ref 3.5–5.1)
Potassium: 4.7 mmol/L (ref 3.5–5.1)

## 2016-10-09 LAB — MAGNESIUM: Magnesium: 2.6 mg/dL — ABNORMAL HIGH (ref 1.7–2.4)

## 2016-10-09 LAB — BRAIN NATRIURETIC PEPTIDE: B Natriuretic Peptide: 446.7 pg/mL — ABNORMAL HIGH (ref 0.0–100.0)

## 2016-10-09 NOTE — Progress Notes (Signed)
Patient ID: [name], female   DOB: 03-May-1931, 81 y.o.   MRN: [id number] S:feels much better O:BP (!) 129/59   Pulse 72   Temp 97.4 F (36.3 C) (Oral)   Resp (!) 22   Ht 5\' 5"  (1.651 m)   Wt 103.5 kg (228 lb 2.8 oz)   SpO2 98%   BMI 37.97 kg/m   Intake/Output Summary (Last 24 hours) at 10/09/16 0841 Last data filed at 10/09/16 0000  Gross per 24 hour  Intake              420 ml  Output              315 ml  Net              105 ml   Intake/Output: I/O last 3 completed shifts: In: 840 [P.O.:240; IV Piggyback:600] Out: 515 [Urine:515]  Intake/Output this shift:  No intake/output data recorded. Weight change: -0.8 kg (-1 lb 12.2 oz) Gen:WD obese WF in NAD CVS:no rub Resp:scattered wheezes and rhonchi [id number] edema   Recent Labs Lab 10/05/16 1201 10/06/16 0536 10/07/16 0548 10/08/16 0515 10/08/16 1055 10/08/16 1822 10/09/16 0206  NA 139 138 134* 133* 135  --  135  K 4.7 4.5 5.2* 5.5* 5.5* 4.9 4.6  4.7  CL 103 100* 98* 95* 98*  --  97*  CO2 28 28 25 30  33*  --  28  GLUCOSE 216* 293* 239* 175* 171*  --  167*  BUN 21* 36* 54* 70* 76*  --  88*  CREATININE 1.20* 1.59* 2.01* 2.32* 2.52*  --  2.53*  ALBUMIN 3.6 3.1*  --   --   --   --  3.1*  CALCIUM 8.8* 8.7* 8.7* 8.5* 8.5*  --  8.1*  PHOS  --   --   --   --   --   --  6.6*  AST 22 20  --   --   --   --   --   ALT 17 16  --   --   --   --   --    Liver Function Tests:  Recent Labs Lab 10/05/16 1201 10/06/16 0536 10/09/16 0206  AST 22 20  --   ALT 17 16  --   ALKPHOS 78 75  --   BILITOT 0.4 0.5  --   PROT 6.7 6.0*  --   ALBUMIN 3.6 3.1* 3.1*   No results for input(s): LIPASE, AMYLASE in the last 168 hours. No results for input(s): AMMONIA in the last 168 hours. CBC:  Recent Labs Lab 10/05/16 1201 10/06/16 0536 10/09/16 0206  WBC 6.1 5.9 10.7*  NEUTROABS 4.8  --   --   HGB 13.0 11.7* 11.0*  HCT 42.7 38.5 36.8  MCV 89.3 89.3 90.4  PLT 185 204 170   Cardiac Enzymes:  Recent  Labs Lab 10/08/16 1055  CKTOTAL 210   CBG:  Recent Labs Lab 10/07/16 2110 10/08/16 0807 10/08/16 1135 10/08/16 2159 10/09/16 0747  GLUCAP 184* 151* 192* 159* 127*    Iron Studies: No results for input(s): IRON, TIBC, TRANSFERRIN, FERRITIN in the last 72 hours. Studies/Results: Dg Chest 2 View  Result Date: 10/08/2016 CLINICAL DATA:  Shortness of breath, wheezing EXAM: CHEST  2 VIEW COMPARISON:  10/06/2016 FINDINGS: Cardiomegaly again noted. Central vascular congestion without convincing pulmonary edema. Suboptimal exam due to patient's large body habitus. Mild basilar atelectasis. No segmental infiltrate. Mild degenerative changes thoracic spine. IMPRESSION:  Central vascular congestion without convincing pulmonary edema. Mild basilar atelectasis. Cardiomegaly. No segmental infiltrate. Electronically Signed   By: Lahoma Crocker M.D.   On: 10/08/2016 08:27   US Renal  Result Date: 10/08/2016 CLINICAL DATA:  Acute kidney injury. History of hypertension, diabetes, and chronic kidney disease. EXAM: RENAL / URINARY TRACT ULTRASOUND COMPLETE COMPARISON:  CT abdomen and pelvis 04/23/2009 FINDINGS: Right Kidney: Length: 11.1 cm. Mild-to-moderate cortical thinning. Echogenicity within normal limits. No mass or hydronephrosis visualized. Left Kidney: Length: 11.4 cm. Mild cortical thinning. Echogenicity within normal limits. No mass or hydronephrosis visualized. Bladder: Decompressed and not visualized. IMPRESSION: Bilateral renal cortical thinning.  No hydronephrosis. Electronically Signed   By: Logan Bores M.D.   On: 10/08/2016 09:56   Dg Chest Port 1 View  Result Date: 10/09/2016 CLINICAL DATA:  Respiratory failure EXAM: PORTABLE CHEST 1 VIEW COMPARISON:  Yesterday FINDINGS: EKG leads create artifact over the chest. Stable mild cardiomegaly, accentuated by technique. Low volume chest with interstitial crowding. There is no edema, consolidation, effusion, or pneumothorax. No acute osseous finding.  IMPRESSION: Stable from yesterday. Low volume chest without consolidation or edema. Electronically Signed   By: Monte Fantasia M.D.   On: 10/09/2016 07:08   . amLODipine  10 mg Oral Daily  . aspirin EC  81 mg Oral BH-q7a  . atorvastatin  40 mg Oral QPM  . azithromycin  500 mg Intravenous Q24H  . cefTRIAXone (ROCEPHIN)  IV  1 g Intravenous Q24H  . fluticasone  2 spray Each Nare Daily  . furosemide  80 mg Intravenous BID  . gabapentin  600 mg Oral QHS  . heparin  5,000 Units Subcutaneous Q8H  . insulin aspart  0-9 Units Subcutaneous TID WC  . insulin glargine  10 Units Subcutaneous QHS  . ipratropium-albuterol  3 mL Nebulization Q6H  . methylPREDNISolone (SOLU-MEDROL) injection  40 mg Intravenous Q8H  . metoprolol tartrate  25 mg Oral BID    BMET    Component Value Date/Time   NA 135 10/09/2016 0206   K 4.7 10/09/2016 0206   K 4.6 10/09/2016 0206   CL 97 (L) 10/09/2016 0206   CO2 28 10/09/2016 0206   GLUCOSE 167 (H) 10/09/2016 0206   BUN 88 (H) 10/09/2016 0206   CREATININE 2.53 (H) 10/09/2016 0206   CALCIUM 8.1 (L) 10/09/2016 0206   GFRNONAA 16 (L) 10/09/2016 0206   GFRAA 19 (L) 10/09/2016 0206   CBC    Component Value Date/Time   WBC 10.7 (H) 10/09/2016 0206   RBC 4.07 10/09/2016 0206   HGB 11.0 (L) 10/09/2016 0206   HCT 36.8 10/09/2016 0206   PLT 170 10/09/2016 0206   MCV 90.4 10/09/2016 0206   MCH 27.0 10/09/2016 0206   MCHC 29.9 (L) 10/09/2016 0206   RDW 14.9 10/09/2016 0206   LYMPHSABS 0.8 10/05/2016 1201   MONOABS 0.4 10/05/2016 1201   EOSABS 0.1 10/05/2016 1201   BASOSABS 0.0 10/05/2016 1201     Assessment/Plan: 1.  AKI/CKD likely related to contrast-induced nephropathy along with ARB induced ATN as her Scr started rising after the IV contrast exposure.  ARB on hold.  Would dose with IV lasix due to hyperkalemia and volume overload.   1. Scr stabilized however not a significant increase in UOP 2. She reports increased UOP although not  documented 3. Continue with lasix and follow  Hypercarbic respiratory failure- PCCM consulted and currently on NIVMS but may need intubation given poor mental status.   2. Hyperkalemia- due to #  1 3. CHF- as above start IV lasix 4. DM- per primary 5. Disposition- agree with Palliative care evaluation  Donetta Potts, MD [facility] [phone]

## 2016-10-09 NOTE — Progress Notes (Signed)
Patient currently on [facility] with sats of 96% and all vitals are stable. Patient is in no distress at this time. BIPAP in room on standby but not needed at this time. Will continue to monitor.

## 2016-10-09 NOTE — Progress Notes (Signed)
Patient taken off bipap at approximately 0800. Doing well on 2L [place] with sats at 97%.

## 2016-10-09 NOTE — Progress Notes (Addendum)
PULMONARY / CRITICAL CARE MEDICINE   Name: [name] MRN: [id number] DOB: 1931-02-15    ADMISSION DATE:  10/05/2016 CONSULTATION DATE: 12/5  REFERRING MD:  [facility]  CHIEF COMPLAINT:  Coughing  Brief:   MO(238 lbs) never smoker(but 2nd hand exposure) PMH of DM,bronchitis, HTN,CAD(no recent 2d) HTN who was admitted 12/2 for cough(white sputum)increased sob. Despite abx and steroids her SOB worsened and Pco2 was (99 and PH 7.11). Remarkably she is awake and alert and in NAD. PCCM was consulted 12/5 and will move her to ICU as she is a full code. Note her bicarb is elevated and her renal function has worsened. We will use NIMVS, check repeat abg,check 2 d , arrestively diuresis. Renal US odered for completeness.  PAST MEDICAL HISTORY :  She  has a past medical history of Anasarca (04/2009); Chronic diastolic CHF (congestive heart failure) ([place]); CKD (chronic kidney disease), stage III; Coronary artery disease; Diabetes mellitus; Herpes labialis; Hypercholesterolemia; Hypertension; Lumbar radiculopathy; Lumbar scoliosis; Morbid obesity ([place]); Pericardial effusion (04/2009); PONV (postoperative nausea and vomiting); Spondylolisthesis; and Spondylosis.  PAST SURGICAL HISTORY: She  has a past surgical history that includes Subxyphoid pericardial window (04/2009); lumbar laminectomy; Cardiac catheterization (04/25/2003); Vaginal hysterectomy; Breast biopsy; Back surgery; Eye surgery; and Total hip arthroplasty (03/06/2012).  Allergies  Allergen Reactions  . Codeine Nausea And Vomiting    Hydrocodone is ok  . Lactose Intolerance (Gi) Diarrhea  . Lodine [Etodolac] Other (See Comments)    dizziness  . Percocet [Oxycodone-Acetaminophen] Other (See Comments)    Percocet does not relieve pts' pain.   . Sulfa Drugs Cross Reactors Nausea Only  . Sulfamethoxazole Nausea And Vomiting    unknown    No current facility-administered medications on file prior to encounter.    Current Outpatient  Prescriptions on File Prior to Encounter  Medication Sig  . amLODipine (NORVASC) 10 MG tablet Take 10 mg by mouth daily.   Marland Kitchen atorvastatin (LIPITOR) 80 MG tablet Take 40 mg by mouth every evening.  . ergocalciferol (VITAMIN D2) 50000 UNITS capsule Take 50,000 Units by mouth once a week. On friday  . furosemide (LASIX) 80 MG tablet Take 1 tablet (80 mg total) by mouth daily.  Marland Kitchen gabapentin (NEURONTIN) 300 MG capsule 2 TABLETS BY MOUTH AT BEDTIME  . GLIPIZIDE XL 5 MG 24 hr tablet Take 5 mg by mouth Daily.  Marland Kitchen guaiFENesin (ROBITUSSIN) 100 MG/5ML liquid Take 5-10 mLs (100-200 mg total) by mouth every 4 (four) hours as needed for cough.  . insulin aspart (NOVOLOG) 100 UNIT/ML injection Inject into the skin as directed. Based on a sliding scale. Inject 3-4 units into the skin daily with breakfast, inject 3-4 units into the skin with lunch. Inject 8 units into the skin daily with dinner.  . insulin glargine (LANTUS) 100 UNIT/ML injection Inject 20 Units into the skin at bedtime.   . metoprolol tartrate (LOPRESSOR) 25 MG tablet Take 25 mg by mouth 2 (two) times daily.  . pioglitazone (ACTOS) 15 MG tablet Take 15 mg by mouth daily.  . valsartan (DIOVAN) 320 MG tablet Take 320 mg by mouth daily.   Marland Kitchen albuterol (PROVENTIL HFA;VENTOLIN HFA) 108 (90 BASE) MCG/ACT inhaler Inhale 2 puffs into the lungs every 4 (four) hours as needed for wheezing or shortness of breath. (Patient not taking: Reported on 10/05/2016)  . IRON PO Take 1 tablet by mouth daily.     SUBJECTIVE:  Off Bipap 0800. On Nasal Cannula. Tolerating well.   VITAL SIGNS: BP (!) 129/59  Pulse 72   Temp 97.4 F (36.3 C) (Oral)   Resp (!) 22   Ht 5\' 5"  (1.651 m)   Wt 103.5 kg (228 lb 2.8 oz)   SpO2 98%   BMI 37.97 kg/m   HEMODYNAMICS:    VENTILATOR SETTINGS: Vent Mode: PCV FiO2 (%):  [24 %-35 %] 30 % Set Rate:  [10 bmp-15 bmp] 10 bmp PEEP:  [5 cmH20-6 cmH20] 6 cmH20  INTAKE / OUTPUT: I/O last 3 completed shifts: In: 840 [P.O.:240;  IV Piggyback:600] Out: 515 [Urine:515]  PHYSICAL EXAMINATION: General:Elderly female, no distress. Eating  Neuro: follows commands, alert, oriented  HEENT: normocephalic  Cardiovascular:  RRR, no MRG, +2 edema to BLE   Lungs: diminished and distant at bases, unlabored  Abdomen: Obese, active bowel sounds  Musculoskeletal:  No deformities  Skin:  Warm and dry  LABS:  BMET  Recent Labs Lab 10/08/16 0515 10/08/16 1055 10/08/16 1822 10/09/16 0206  NA 133* 135  --  135  K 5.5* 5.5* 4.9 4.6  4.7  CL 95* 98*  --  97*  CO2 30 33*  --  28  BUN 70* 76*  --  88*  CREATININE 2.32* 2.52*  --  2.53*  GLUCOSE 175* 171*  --  167*    Electrolytes  Recent Labs Lab 10/08/16 0515 10/08/16 1055 10/09/16 0206  CALCIUM 8.5* 8.5* 8.1*  MG  --   --  2.6*  PHOS  --   --  6.6*    CBC  Recent Labs Lab 10/05/16 1201 10/06/16 0536 10/09/16 0206  WBC 6.1 5.9 10.7*  HGB 13.0 11.7* 11.0*  HCT 42.7 38.5 36.8  PLT 185 204 170    Coag's No results for input(s): APTT, INR in the last 168 hours.  Sepsis Markers No results for input(s): LATICACIDVEN, PROCALCITON, O2SATVEN in the last 168 hours.  ABG  Recent Labs Lab 10/08/16 1028 10/08/16 2202  PHART 7.112* 7.210*  PCO2ART 99.3* 73.1*  PO2ART 76.9* 76.0*    Liver Enzymes  Recent Labs Lab 10/05/16 1201 10/06/16 0536 10/09/16 0206  AST 22 20  --   ALT 17 16  --   ALKPHOS 78 75  --   BILITOT 0.4 0.5  --   ALBUMIN 3.6 3.1* 3.1*    Cardiac Enzymes No results for input(s): TROPONINI, PROBNP in the last 168 hours.  Glucose  Recent Labs Lab 10/07/16 1654 10/07/16 2110 10/08/16 0807 10/08/16 1135 10/08/16 2159 10/09/16 0747  GLUCAP 221* 184* 151* 192* 159* 127*    Imaging US Renal  Result Date: 10/08/2016 CLINICAL DATA:  Acute kidney injury. History of hypertension, diabetes, and chronic kidney disease. EXAM: RENAL / URINARY TRACT ULTRASOUND COMPLETE COMPARISON:  CT abdomen and pelvis 04/23/2009 FINDINGS:  Right Kidney: Length: 11.1 cm. Mild-to-moderate cortical thinning. Echogenicity within normal limits. No mass or hydronephrosis visualized. Left Kidney: Length: 11.4 cm. Mild cortical thinning. Echogenicity within normal limits. No mass or hydronephrosis visualized. Bladder: Decompressed and not visualized. IMPRESSION: Bilateral renal cortical thinning.  No hydronephrosis. Electronically Signed   By: Logan Bores M.D.   On: 10/08/2016 09:56   Dg Chest Port 1 View  Result Date: 10/09/2016 CLINICAL DATA:  Respiratory failure EXAM: PORTABLE CHEST 1 VIEW COMPARISON:  Yesterday FINDINGS: EKG leads create artifact over the chest. Stable mild cardiomegaly, accentuated by technique. Low volume chest with interstitial crowding. There is no edema, consolidation, effusion, or pneumothorax. No acute osseous finding. IMPRESSION: Stable from yesterday. Low volume chest without consolidation or edema. Electronically Signed  By: Monte Fantasia M.D.   On: 10/09/2016 07:08     STUDIES:  12/5 2d>> grade 2 dd, pulm htn, ef 55-60  12/5 renal US>>bilateral renal cortical thinning. No hydronephrosis   CULTURES: 12/4 sputum>> 12/4 uc>> Ecoli>>  ANTIBIOTICS: 12/4 roc>> 12/4 zithro>>  SIGNIFICANT EVENTS: Off bipap at 0800. Tolerating Nasal Cannula.   LINES/TUBES:   DISCUSSION: MO(238 lbs) never smoker(but 2nd hand exposure) PMH of DM,bronchitis, HTN,CAD(no recent 2d) HTN who was admitted 12/2 for cough(white sputum)increased sob. Despite abx and steroids her SOB worsened and Pco2 was (99 and PH 7.11). Remarkably she is awake and alert and in NAD. PCCM was consulted 12/5 and will move her to ICU as she is a full code. Note her bicarb is elevated and her renal function has worsened. We will use NIMVS, check repeat abg,check 2 d , arrestively diuresis. Renal US odered for completeness.  ASSESSMENT / PLAN:  PULMONARY A: Recent bronchitis Probable COPD Chronic CO2 retention made worse by renal  failure Bronchospasm VCD component P:   Continue Steroids  BD's Supplemental oxygen for goal >88 IS and flutter  BIPAP as needed, currently off  CARDIOVASCULAR A:  Acute on Chronic Diastolic Heart Failure (EF 55-60)  Moderate Pulmonary HTN  -PA Pressure 47  P:  Continue ASA Continue Norvasc and Metoprolol  Cards following   Renal  A:   Acute on Chronic Renal Failure  Hyperkalemia P:   Consult Renal  Continue 80 Lasix BID per Cards  Trend BMP  Replace electrolytes as needed   GASTROINTESTINAL A:   MO GI protection P:   PPI Carb Mod diet  HEMATOLOGIC A:   DVT protection P:  SQ heparin Follow CBC   INFECTIOUS A:   Recent bronchitis  No elevated WBC Afebrile P:   Abx per IM Low threshold to dc abx Trend Fever and WBC curve   ENDOCRINE A:   DM  Hyperglycemia - current steroid use    P:   SSI  NEUROLOGIC A:   Acute Encephalopathy in setting of elevated CO2 - improving  P:   RASS goal:  Avoid all sedative medications  Continue home Neurontin    FAMILY  - Updates: Pt and family updated 12/5  - Inter-disciplinary family meet or Palliative Care meeting due by: day [place], AG-ACNP [facility]  Pgr: [phone]  PCCM Pgr: [phone]  Attending Note:  I have examined patient, reviewed labs, studies and notes. I have discussed the case with Beaulah Corin, and I agree with the data and plans as amended above. 82 yo woman with multifactorial hypercapneic and hypoxemic resp failure due to OSA / OHS, upper airway obstruction and probable superimposed COPD, diastolic CHF. She required urgent BiPAP for significant acute resp acidosis, now clinically improved. On my eval she is awake, interacts, is comfortable on [place] O2. Coarse on exam but no wheeze. We will continue her steroids, BD's, diuretics. Move her to SDU. Continue to have BiPAP available to use prn. Will ask [facility] to resume her care on 12/7.    Baltazar Apo, MD,  PhD 10/09/2016, 10:23 AM [facility] [phone] or if no answer [phone]

## 2016-10-10 DIAGNOSIS — G9340 Encephalopathy, unspecified: Secondary | ICD-10-CM

## 2016-10-10 DIAGNOSIS — N183 Chronic kidney disease, stage 3 unspecified: Secondary | ICD-10-CM

## 2016-10-10 DIAGNOSIS — J449 Chronic obstructive pulmonary disease, unspecified: Secondary | ICD-10-CM

## 2016-10-10 DIAGNOSIS — Z1612 Extended spectrum beta lactamase (ESBL) resistance: Secondary | ICD-10-CM

## 2016-10-10 DIAGNOSIS — I5032 Chronic diastolic (congestive) heart failure: Secondary | ICD-10-CM

## 2016-10-10 DIAGNOSIS — I5033 Acute on chronic diastolic (congestive) heart failure: Secondary | ICD-10-CM

## 2016-10-10 DIAGNOSIS — J441 Chronic obstructive pulmonary disease with (acute) exacerbation: Secondary | ICD-10-CM

## 2016-10-10 DIAGNOSIS — A498 Other bacterial infections of unspecified site: Secondary | ICD-10-CM

## 2016-10-10 DIAGNOSIS — N390 Urinary tract infection, site not specified: Secondary | ICD-10-CM

## 2016-10-10 DIAGNOSIS — B9629 Other Escherichia coli [E. coli] as the cause of diseases classified elsewhere: Secondary | ICD-10-CM

## 2016-10-10 DIAGNOSIS — E118 Type 2 diabetes mellitus with unspecified complications: Secondary | ICD-10-CM

## 2016-10-10 DIAGNOSIS — I272 Pulmonary hypertension, unspecified: Secondary | ICD-10-CM

## 2016-10-10 LAB — BASIC METABOLIC PANEL
Anion gap: 12 (ref 5–15)
BUN: 98 mg/dL — ABNORMAL HIGH (ref 6–20)
CO2: 29 mmol/L (ref 22–32)
Calcium: 8.4 mg/dL — ABNORMAL LOW (ref 8.9–10.3)
Chloride: 97 mmol/L — ABNORMAL LOW (ref 101–111)
Creatinine, Ser: 2 mg/dL — ABNORMAL HIGH (ref 0.44–1.00)
GFR calc Af Amer: 25 mL/min — ABNORMAL LOW (ref >60)
GFR calc non Af Amer: 22 mL/min — ABNORMAL LOW (ref >60)
Glucose, Bld: 255 mg/dL — ABNORMAL HIGH (ref 65–99)
Potassium: 4.4 mmol/L (ref 3.5–5.1)
Sodium: 138 mmol/L (ref 135–145)

## 2016-10-10 LAB — PROTEIN ELECTROPHORESIS, SERUM
A/G Ratio: 1.1 (ref 0.7–1.7)
Albumin ELP: 3.3 g/dL (ref 2.9–4.4)
Alpha-1-Globulin: 0.2 g/dL (ref 0.0–0.4)
Alpha-2-Globulin: 1.1 g/dL — ABNORMAL HIGH (ref 0.4–1.0)
Beta Globulin: 0.9 g/dL (ref 0.7–1.3)
Gamma Globulin: 0.9 g/dL (ref 0.4–1.8)
Globulin, Total: 3.1 g/dL (ref 2.2–3.9)
Total Protein ELP: 6.4 g/dL (ref 6.0–8.5)

## 2016-10-10 LAB — GLUCOSE, CAPILLARY
Glucose-Capillary: 220 mg/dL — ABNORMAL HIGH (ref 65–99)
Glucose-Capillary: 258 mg/dL — ABNORMAL HIGH (ref 65–99)
Glucose-Capillary: 309 mg/dL — ABNORMAL HIGH (ref 65–99)
Glucose-Capillary: 250 mg/dL — ABNORMAL HIGH (ref 65–99)
Glucose-Capillary: 187 mg/dL — ABNORMAL HIGH (ref 65–99)

## 2016-10-10 LAB — CBC
HCT: 38 % (ref 36.0–46.0)
Hemoglobin: 12 g/dL (ref 12.0–15.0)
MCH: 27.5 pg (ref 26.0–34.0)
MCHC: 31.6 g/dL (ref 30.0–36.0)
MCV: 87 fL (ref 78.0–100.0)
Platelets: 218 10*3/uL (ref 150–400)
RBC: 4.37 MIL/uL (ref 3.87–5.11)
RDW: 14.5 % (ref 11.5–15.5)
WBC: 10.1 10*3/uL (ref 4.0–10.5)

## 2016-10-10 LAB — MAGNESIUM: Magnesium: 2.8 mg/dL — ABNORMAL HIGH (ref 1.7–2.4)

## 2016-10-10 LAB — CULTURE, RESPIRATORY W GRAM STAIN: Culture: NORMAL

## 2016-10-10 LAB — CULTURE, RESPIRATORY

## 2016-10-10 LAB — PHOSPHORUS: Phosphorus: 4.3 mg/dL (ref 2.5–4.6)

## 2016-10-10 MED ORDER — INSULIN ASPART 100 UNIT/ML ~~LOC~~ SOLN
0.0000 [IU] | SUBCUTANEOUS | Status: DC
  Administered 2016-10-10: 5 [IU] via SUBCUTANEOUS
  Administered 2016-10-10: 11 [IU] via SUBCUTANEOUS
  Administered 2016-10-10 – 2016-10-11 (×2): 3 [IU] via SUBCUTANEOUS

## 2016-10-10 MED ORDER — AZITHROMYCIN 500 MG PO TABS
500.0000 mg | ORAL_TABLET | Freq: Every evening | ORAL | Status: AC
  Administered 2016-10-10 – 2016-10-11 (×2): 500 mg via ORAL
  Filled 2016-10-10 (×2): qty 1

## 2016-10-10 MED ORDER — FUROSEMIDE 10 MG/ML IJ SOLN
80.0000 mg | Freq: Two times a day (BID) | INTRAMUSCULAR | Status: AC
  Administered 2016-10-10 – 2016-10-11 (×3): 80 mg via INTRAVENOUS
  Filled 2016-10-10 (×3): qty 8

## 2016-10-10 MED ORDER — METHYLPREDNISOLONE SODIUM SUCC 40 MG IJ SOLR
40.0000 mg | INTRAMUSCULAR | Status: DC
  Administered 2016-10-11: 40 mg via INTRAVENOUS
  Filled 2016-10-10: qty 1

## 2016-10-10 MED ORDER — IPRATROPIUM-ALBUTEROL 0.5-2.5 (3) MG/3ML IN SOLN
3.0000 mL | Freq: Three times a day (TID) | RESPIRATORY_TRACT | Status: DC
  Administered 2016-10-11 – 2016-10-15 (×12): 3 mL via RESPIRATORY_TRACT
  Filled 2016-10-10 (×13): qty 3

## 2016-10-10 NOTE — Progress Notes (Signed)
Patient stable and without distress on 2L nasal cannula, patient not indicated for BiPAP at this time. RT will continue to monitor.

## 2016-10-10 NOTE — Progress Notes (Signed)
Inpatient Diabetes Program Recommendations  AACE/ADA: New Consensus Statement on Inpatient Glycemic Control (2015)  Target Ranges:  Prepandial:   less than 140 mg/dL      Peak postprandial:   less than 180 mg/dL (1-2 hours)      Critically ill patients:  140 - 180 mg/dL  Results for MORAG, RADEMACHER (MRN [id number]) as of 10/10/2016 09:44  Ref. Range 10/09/2016 07:47 10/09/2016 11:06 10/09/2016 16:20 10/09/2016 22:08 10/10/2016 08:28  Glucose-Capillary Latest Ref Range: 65 - 99 mg/dL 127 (H) 245 (H) 201 (H) 301 (H) 220 (H)   Review of Glycemic Control  Diabetes history: DM2 Outpatient Diabetes medications: Lantus 20 units QHS, Novolog 3-4 units with breakfast and lunch, Novolog 8 units with supper, Actos 15 mg daily Current orders for Inpatient glycemic control: Lantus 10 units QHS, Novolog 0-9 units TID with meals  Inpatient Diabetes Program Recommendations: Insulin - Basal: Please consider increasing Lantus to 15 units QHS. Insulin - Meal Coverage: If steroids are continued, please consider ordering Novolog 4 units TID with meals if patient eats at least 50% of meals.  Thanks, Barnie Alderman, RN, MSN, CDE Diabetes Coordinator Inpatient Diabetes Program [phone] (Team Pager from 8am to 5pm)

## 2016-10-10 NOTE — Progress Notes (Signed)
Patient ID: [name], female   DOB: 06-09-1931, 82 y.o.   MRN: [id number] S:Feels better O:BP (!) 145/56   Pulse 66   Temp 97.9 F (36.6 C) (Oral)   Resp 18   Ht 5\' 5"  (1.651 m)   Wt 102.1 kg (225 lb 1.4 oz)   SpO2 96%   BMI 37.46 kg/m   Intake/Output Summary (Last 24 hours) at 10/10/16 0900 Last data filed at 10/09/16 2000  Gross per 24 hour  Intake              970 ml  Output              251 ml  Net              719 ml   Intake/Output: I/O last 3 completed shifts: In: 3 [P.O.:720; IV Piggyback:550] Out: 501 [Urine:500; Stool:1]  Intake/Output this shift:  No intake/output data recorded. Weight change: -0.9 kg (-1 lb 15.8 oz) Gen:WD obese WF in NAD CVS:no rub Resp:scattered expiratory wheezes bilaterally [id number], +BS Ext:no edema   Recent Labs Lab 10/05/16 1201 10/06/16 0536 10/07/16 0548 10/08/16 0515 10/08/16 1055 10/08/16 1822 10/09/16 0206 10/09/16 0925 10/10/16 0242  NA 139 138 134* 133* 135  --  135  --  138  K 4.7 4.5 5.2* 5.5* 5.5* 4.9 4.6  4.7 4.7 4.4  CL 103 100* 98* 95* 98*  --  97*  --  97*  CO2 28 28 25 30  33*  --  28  --  29  GLUCOSE 216* 293* 239* 175* 171*  --  167*  --  255*  BUN 21* 36* 54* 70* 76*  --  88*  --  98*  CREATININE 1.20* 1.59* 2.01* 2.32* 2.52*  --  2.53*  --  2.00*  ALBUMIN 3.6 3.1*  --   --   --   --  3.1*  --   --   CALCIUM 8.8* 8.7* 8.7* 8.5* 8.5*  --  8.1*  --  8.4*  PHOS  --   --   --   --   --   --  6.6*  --  4.3  AST 22 20  --   --   --   --   --   --   --   ALT 17 16  --   --   --   --   --   --   --    Liver Function Tests:  Recent Labs Lab 10/05/16 1201 10/06/16 0536 10/09/16 0206  AST 22 20  --   ALT 17 16  --   ALKPHOS 78 75  --   BILITOT 0.4 0.5  --   PROT 6.7 6.0*  --   ALBUMIN 3.6 3.1* 3.1*   No results for input(s): LIPASE, AMYLASE in the last 168 hours. No results for input(s): AMMONIA in the last 168 hours. CBC:  Recent Labs Lab 10/05/16 1201 10/06/16 0536 10/09/16 0206  10/10/16 0242  WBC 6.1 5.9 10.7* 10.1  NEUTROABS 4.8  --   --   --   HGB 13.0 11.7* 11.0* 12.0  HCT 42.7 38.5 36.8 38.0  MCV 89.3 89.3 90.4 87.0  PLT 185 204 170 218   Cardiac Enzymes:  Recent Labs Lab 10/08/16 1055  CKTOTAL 210   CBG:  Recent Labs Lab 10/09/16 0747 10/09/16 1106 10/09/16 1620 10/09/16 2208 10/10/16 0828  GLUCAP 127* 245* 201* 301* 220*    Iron Studies: No results  for input(s): IRON, TIBC, TRANSFERRIN, FERRITIN in the last 72 hours. Studies/Results: US Renal  Result Date: 10/08/2016 CLINICAL DATA:  Acute kidney injury. History of hypertension, diabetes, and chronic kidney disease. EXAM: RENAL / URINARY TRACT ULTRASOUND COMPLETE COMPARISON:  CT abdomen and pelvis 04/23/2009 FINDINGS: Right Kidney: Length: 11.1 cm. Mild-to-moderate cortical thinning. Echogenicity within normal limits. No mass or hydronephrosis visualized. Left Kidney: Length: 11.4 cm. Mild cortical thinning. Echogenicity within normal limits. No mass or hydronephrosis visualized. Bladder: Decompressed and not visualized. IMPRESSION: Bilateral renal cortical thinning.  No hydronephrosis. Electronically Signed   By: Logan Bores M.D.   On: 10/08/2016 09:56   Dg Chest Port 1 View  Result Date: 10/09/2016 CLINICAL DATA:  Respiratory failure EXAM: PORTABLE CHEST 1 VIEW COMPARISON:  Yesterday FINDINGS: EKG leads create artifact over the chest. Stable mild cardiomegaly, accentuated by technique. Low volume chest with interstitial crowding. There is no edema, consolidation, effusion, or pneumothorax. No acute osseous finding. IMPRESSION: Stable from yesterday. Low volume chest without consolidation or edema. Electronically Signed   By: Monte Fantasia M.D.   On: 10/09/2016 07:08   . amLODipine  10 mg Oral Daily  . aspirin EC  81 mg Oral BH-q7a  . atorvastatin  40 mg Oral QPM  . azithromycin  500 mg Intravenous Q24H  . cefTRIAXone (ROCEPHIN)  IV  1 g Intravenous Q24H  . fluticasone  2 spray Each Nare  Daily  . furosemide  80 mg Intravenous BID  . gabapentin  600 mg Oral QHS  . heparin  5,000 Units Subcutaneous Q8H  . insulin aspart  0-9 Units Subcutaneous TID WC  . insulin glargine  10 Units Subcutaneous QHS  . ipratropium-albuterol  3 mL Nebulization Q6H  . methylPREDNISolone (SOLU-MEDROL) injection  40 mg Intravenous Q8H  . metoprolol tartrate  25 mg Oral BID    BMET    Component Value Date/Time   NA 138 10/10/2016 0242   K 4.4 10/10/2016 0242   CL 97 (L) 10/10/2016 0242   CO2 29 10/10/2016 0242   GLUCOSE 255 (H) 10/10/2016 0242   BUN 98 (H) 10/10/2016 0242   CREATININE 2.00 (H) 10/10/2016 0242   CALCIUM 8.4 (L) 10/10/2016 0242   GFRNONAA 22 (L) 10/10/2016 0242   GFRAA 25 (L) 10/10/2016 0242   CBC    Component Value Date/Time   WBC 10.1 10/10/2016 0242   RBC 4.37 10/10/2016 0242   HGB 12.0 10/10/2016 0242   HCT 38.0 10/10/2016 0242   PLT 218 10/10/2016 0242   MCV 87.0 10/10/2016 0242   MCH 27.5 10/10/2016 0242   MCHC 31.6 10/10/2016 0242   RDW 14.5 10/10/2016 0242   LYMPHSABS 0.8 10/05/2016 1201   MONOABS 0.4 10/05/2016 1201   EOSABS 0.1 10/05/2016 1201   BASOSABS 0.0 10/05/2016 1201    Assessment/Plan: 1.  AKI/CKD likely related to contrast-induced nephropathy along with ARB induced ATN as her Scr started rising after the IV contrast exposure.  ARB on hold.   1. Scr continues to improve 2. Continue to hold ARB and follow renal function 3. Would not resume ARB until she has recovered and can do so with her PCP as an outpatient. 4. Nothing further to add.  Will sign off.  Please call with questions.  Won't need to f/u with our practice as her renal function should return to baseline.  2. Hypercarbic respiratory failure- PCCM consulted and currently off NIVMS and doing well.  Responding to steroids   3. Hyperkalemia- due to #1 4.  CHF- as above start IV lasix 5. DM- per primary 6. Disposition- agre  Donetta Potts, MD [employer] [phone]

## 2016-10-10 NOTE — Progress Notes (Signed)
PROGRESS NOTE    [name]  [id number] DOB: [birth year] DOA: 10/05/2016 PCP: Limmie Patricia, MD   Brief Narrative:   82 y.o. female PMHx Chronic Diastolic CHF, last EF [id number] in October 2013, HTN, CAD native artery,Anasarca, DM type II controlled with complication, HLD, CKD, Spondylolisthesis,Lumbar radiculopathy  Presenting to the ED today with multiple complaints including cough chest congestion shortness of breath and nasal congestion, without epistaxis or hemoptysis. This began about 5 days ago, and insight accompanied by white sputum. She also endorses chest congestion, and reports possible sick contacts. She has not tried any over-the-counter medication. She has developed wheezing, with shortness of breath prior to presentation. She also reports shortness of breath with exertion. She denies any history of COPD. She does have prior history of bronchitis. She reports that these symptoms feel similar to her prior bronchitis, although worse. She denies any chest pain, nausea or vomiting. She denies any abdominal pain or diarrhea. She denies any lower extremity swelling. She denies any lightheadedness, dizziness or headache. No vision changes. No apology. She denies any urinary symptoms. She denies any history of DVT, hormonal use, recent hospitalizations or surgeries, prolonged immobilization or calf tenderness.   Subjective: 12/7  A/O 4, positive acute respiratory distress. Negative CP. States does not use home O2. States baseline weight 221 pounds but she no she is~15 pounds heavier. States does weigh self daily.     Assessment & Plan:   Active Problems:   Hypertension   Dyspnea   Chronic diastolic CHF (congestive heart failure) (HCC)   Dyslipidemia   Diabetes mellitus (HCC)   Hypoxia   Acute on chronic respiratory failure with hypercapnia (HCC)   Acute bronchitis due to other specified organisms   Cough   Wheezing   Acute respiratory failure (HCC)   Recent  bronchitis/COPD Exacerbation/Chronic CO2 retainer -Solu-Medrol 40 mg daily -DuoNeb QID -Flutter valve -Continue current antibiotics  Acute on Chronic Diastolic Heart Failure (EF 55-60)  -Strict I&O -Daily weight Filed Weights   10/08/16 1200 10/09/16 0336 10/10/16 0600  Weight: 103 kg (227 lb 1.2 oz) 103.5 kg (228 lb 2.8 oz) 102.1 kg (225 lb 1.4 oz)  -Amlodipine 10 mg daily -Lasix 80 mg BID 4 doses -Metoprolol 25 mg BID  Moderate Pulmonary HTN  -See heart failure  -Cardiology following   Acute on Chronic Renal Failure  Lab Results  Component Value Date   CREATININE 2.00 (H) 10/10/2016   CREATININE 2.53 (H) 10/09/2016   CREATININE 2.52 (H) 10/08/2016    DM type II controlled with complication -[id number] Hemoglobin A1c= 6.1 -Lantus 10 units daily -Increase to moderate SSI  Acute Encephalopathy in setting of elevated CO2  -Resolved   positive Ecoli UTI -Treated with current antibiotic    DVT prophylaxis: Subcutaneous heparin Code Status: Partial Family Communication: None Disposition Plan: Resolution COPD exacerbation   Consultants:  Cardiology    Procedures/Significant Events:  12/5 2d>> grade 2 dd, pulm htn, ef 55-60  12/5 renal US>>bilateral renal cortical thinning. No hydronephrosis  Off bipap at 0800. Tolerating Nasal Cannula  VENTILATOR SETTINGS: NA   Cultures CULTURES: 12/4 sputum>> 12/4 urine>> positive Ecoli>>  Antimicrobials: 12/4 roc>> 12/4 zithro>>   Devices    LINES / TUBES:      Continuous Infusions:   Objective: Vitals:   10/10/16 1000 10/10/16 1100 10/10/16 1200 10/10/16 1330  BP: (!) 142/50 (!) 141/56 (!) 142/55   Pulse: 70 64 64   Resp: 17 17 19    Temp:  TempSrc:      SpO2: 95% 97% 97% 97%  Weight:      Height:        Intake/Output Summary (Last 24 hours) at 10/10/16 1449 Last data filed at 10/10/16 0800  Gross per 24 hour  Intake              730 ml  Output              350 ml  Net               380 ml   Filed Weights   10/08/16 1200 10/09/16 0336 10/10/16 0600  Weight: 103 kg (227 lb 1.2 oz) 103.5 kg (228 lb 2.8 oz) 102.1 kg (225 lb 1.4 oz)    Examination:  General: A/O 4, positive acute respiratory distress Eyes: negative scleral hemorrhage, negative anisocoria, negative icterus ENT: Negative Runny nose, negative gingival bleeding, Neck:  Negative scars, masses, torticollis, lymphadenopathy, JVD Lungs: Clear to auscultation bilaterally positive diffuse expiratory wheezes, negative  crackles Cardiovascular: Regular rate and rhythm without murmur gallop or rub normal S1 and S2 Abdomen: Morbidly obese, negative abdominal pain, nondistended, positive soft, bowel sounds, no rebound, no ascites, no appreciable mass Extremities: No significant cyanosis, clubbing. Bilateral lower extremity edema (per patient Baseline) Skin: Negative rashes, lesions, ulcers Psychiatric:  Negative depression, negative anxiety, negative fatigue, negative mania  Central nervous system:  Cranial nerves II through XII intact, tongue/uvula midline, all extremities muscle strength 5/5, sensation intact throughout, negative dysarthria, negative expressive aphasia, negative receptive aphasia.  .     Data Reviewed: Care during the described time interval was provided by me .  I have reviewed this patient's available data, including medical history, events of note, physical examination, and all test results as part of my evaluation. I have personally reviewed and interpreted all radiology studies.  CBC:  Recent Labs Lab 10/05/16 1201 10/06/16 0536 10/09/16 0206 10/10/16 0242  WBC 6.1 5.9 10.7* 10.1  NEUTROABS 4.8  --   --   --   HGB 13.0 11.7* 11.0* 12.0  HCT 42.7 38.5 36.8 38.0  MCV 89.3 89.3 90.4 87.0  PLT 185 204 170 [id number]   Basic Metabolic Panel:  Recent Labs Lab 10/07/16 0548 10/08/16 0515 10/08/16 1055 10/08/16 1822 10/09/16 0206 10/09/16 0925 10/10/16 0242  NA 134* 133* 135  --  135   --  138  K 5.2* 5.5* 5.5* 4.9 4.6  4.7 4.7 4.4  CL 98* 95* 98*  --  97*  --  97*  CO2 25 30 33*  --  28  --  29  GLUCOSE 239* 175* 171*  --  167*  --  255*  BUN 54* 70* 76*  --  88*  --  98*  CREATININE 2.01* 2.32* 2.52*  --  2.53*  --  2.00*  CALCIUM 8.7* 8.5* 8.5*  --  8.1*  --  8.4*  MG  --   --   --   --  2.6*  --  2.8*  PHOS  --   --   --   --  6.6*  --  4.3   GFR: Estimated Creatinine Clearance: 25.2 mL/min (by C-G formula based on SCr of 2 mg/dL (H)). Liver Function Tests:  Recent Labs Lab 10/05/16 1201 10/06/16 0536 10/09/16 0206  AST 22 20  --   ALT 17 16  --   ALKPHOS 78 75  --   BILITOT 0.4 0.5  --   PROT 6.7 6.0*  --  ALBUMIN 3.6 3.1* 3.1*   No results for input(s): LIPASE, AMYLASE in the last 168 hours. No results for input(s): AMMONIA in the last 168 hours. Coagulation Profile: No results for input(s): INR, PROTIME in the last 168 hours. Cardiac Enzymes:  Recent Labs Lab 10/08/16 1055  CKTOTAL 210   BNP (last 3 results) No results for input(s): PROBNP in the last 8760 hours. HbA1C: No results for input(s): HGBA1C in the last 72 hours. CBG:  Recent Labs Lab 10/09/16 1106 10/09/16 1620 10/09/16 2208 10/10/16 0828 10/10/16 1129  GLUCAP 245* 201* 301* 220* 258*   Lipid Profile: No results for input(s): CHOL, HDL, LDLCALC, TRIG, CHOLHDL, LDLDIRECT in the last 72 hours. Thyroid Function Tests:  Recent Labs  10/08/16 1223  TSH 0.378   Anemia Panel: No results for input(s): VITAMINB12, FOLATE, FERRITIN, TIBC, IRON, RETICCTPCT in the last 72 hours. Urine analysis:    Component Value Date/Time   COLORURINE YELLOW 10/06/2016 2321   APPEARANCEUR CLOUDY (A) 10/06/2016 2321   LABSPEC 1.022 10/06/2016 2321   PHURINE 5.0 10/06/2016 2321   GLUCOSEU NEGATIVE 10/06/2016 2321   HGBUR NEGATIVE 10/06/2016 2321   BILIRUBINUR NEGATIVE 10/06/2016 2321   KETONESUR NEGATIVE 10/06/2016 2321   PROTEINUR NEGATIVE 10/06/2016 2321   UROBILINOGEN 0.2  03/10/2012 0800   NITRITE NEGATIVE 10/06/2016 2321   LEUKOCYTESUR NEGATIVE 10/06/2016 2321   Sepsis Labs: @LABRCNTIP (procalcitonin:4,lacticidven:4)  ) Recent Results (from the past 240 hour(s))  Urine culture     Status: Abnormal   Collection Time: 10/06/16 11:22 PM  Result Value Ref Range Status   Specimen Description URINE, RANDOM  Final   Special Requests NONE  Final   Culture 20,000 COLONIES/mL ESCHERICHIA COLI (A)  Final   Report Status 10/09/2016 FINAL  Final   Organism ID, Bacteria ESCHERICHIA COLI (A)  Final      Susceptibility   Escherichia coli - MIC*    AMPICILLIN >=32 RESISTANT Resistant     CEFAZOLIN >=64 RESISTANT Resistant     CEFTRIAXONE <=1 SENSITIVE Sensitive     CIPROFLOXACIN >=4 RESISTANT Resistant     GENTAMICIN <=1 SENSITIVE Sensitive     IMIPENEM <=0.25 SENSITIVE Sensitive     NITROFURANTOIN <=16 SENSITIVE Sensitive     TRIMETH/SULFA <=20 SENSITIVE Sensitive     AMPICILLIN/SULBACTAM >=32 RESISTANT Resistant     PIP/TAZO 64 INTERMEDIATE Intermediate     Extended ESBL NEGATIVE Sensitive     * 20,000 COLONIES/mL ESCHERICHIA COLI  Culture, sputum-assessment     Status: None   Collection Time: 10/07/16  5:09 PM  Result Value Ref Range Status   Specimen Description EXPECTORATED SPUTUM  Final   Special Requests NONE  Final   Sputum evaluation   Final    THIS SPECIMEN IS ACCEPTABLE. RESPIRATORY CULTURE REPORT TO FOLLOW.   Report Status 10/07/2016 FINAL  Final  Culture, respiratory (NON-Expectorated)     Status: None   Collection Time: 10/07/16  5:09 PM  Result Value Ref Range Status   Specimen Description SPUTUM  Final   Special Requests NONE  Final   Gram Stain   Final    ABUNDANT WBC PRESENT, PREDOMINANTLY PMN ABUNDANT GRAM POSITIVE COCCI IN PAIRS ABUNDANT GRAM NEGATIVE RODS FEW GRAM NEGATIVE COCCI IN PAIRS FEW GRAM POSITIVE RODS    Culture Consistent with normal respiratory flora.  Final   Report Status 10/10/2016 FINAL  Final  MRSA PCR Screening      Status: None   Collection Time: 10/08/16 11:59 AM  Result Value Ref Range Status  MRSA by PCR NEGATIVE NEGATIVE Final    Comment:        The GeneXpert MRSA Assay (FDA approved for NASAL specimens only), is one component of a comprehensive MRSA colonization surveillance program. It is not intended to diagnose MRSA infection nor to guide or monitor treatment for MRSA infections.          Radiology Studies: Dg Chest Port 1 View  Result Date: 10/09/2016 CLINICAL DATA:  Respiratory failure EXAM: PORTABLE CHEST 1 VIEW COMPARISON:  Yesterday FINDINGS: EKG leads create artifact over the chest. Stable mild cardiomegaly, accentuated by technique. Low volume chest with interstitial crowding. There is no edema, consolidation, effusion, or pneumothorax. No acute osseous finding. IMPRESSION: Stable from yesterday. Low volume chest without consolidation or edema. Electronically Signed   By: Monte Fantasia M.D.   On: 10/09/2016 07:08        Scheduled Meds: . amLODipine  10 mg Oral Daily  . aspirin EC  81 mg Oral [id number]  . atorvastatin  40 mg Oral QPM  . azithromycin  500 mg Oral QPM  . cefTRIAXone (ROCEPHIN)  IV  1 g Intravenous Q24H  . fluticasone  2 spray Each Nare Daily  . gabapentin  600 mg Oral QHS  . heparin  5,000 Units Subcutaneous Q8H  . insulin aspart  0-9 Units Subcutaneous TID WC  . insulin glargine  10 Units Subcutaneous QHS  . ipratropium-albuterol  3 mL Nebulization Q6H  . methylPREDNISolone (SOLU-MEDROL) injection  40 mg Intravenous Q8H  . metoprolol tartrate  25 mg Oral BID   Continuous Infusions:   LOS: 4 days    Time spent: 40 minutes    Sherre Wooton, Geraldo Docker, MD [facility] Pager [phone]   If 7PM-7AM, please contact night-coverage [email or web address] Password [email or web address] 10/10/2016, 2:49 PM

## 2016-10-10 NOTE — Progress Notes (Addendum)
Spoke with Dr. Sherral Hammers. Clarified transfer orders. Put in system now. Will continue to monitor pt closely. Leanne Chang, RN

## 2016-10-11 ENCOUNTER — Inpatient Hospital Stay (HOSPITAL_COMMUNITY): Payer: Medicare Other

## 2016-10-11 DIAGNOSIS — I5033 Acute on chronic diastolic (congestive) heart failure: Secondary | ICD-10-CM

## 2016-10-11 DIAGNOSIS — N183 Chronic kidney disease, stage 3 (moderate): Secondary | ICD-10-CM

## 2016-10-11 DIAGNOSIS — J9602 Acute respiratory failure with hypercapnia: Secondary | ICD-10-CM

## 2016-10-11 DIAGNOSIS — J441 Chronic obstructive pulmonary disease with (acute) exacerbation: Principal | ICD-10-CM

## 2016-10-11 DIAGNOSIS — J9601 Acute respiratory failure with hypoxia: Secondary | ICD-10-CM

## 2016-10-11 DIAGNOSIS — G9340 Encephalopathy, unspecified: Secondary | ICD-10-CM

## 2016-10-11 DIAGNOSIS — N170 Acute kidney failure with tubular necrosis: Secondary | ICD-10-CM

## 2016-10-11 DIAGNOSIS — M79609 Pain in unspecified limb: Secondary | ICD-10-CM

## 2016-10-11 DIAGNOSIS — M7989 Other specified soft tissue disorders: Secondary | ICD-10-CM

## 2016-10-11 LAB — GLUCOSE, CAPILLARY
Glucose-Capillary: 156 mg/dL — ABNORMAL HIGH (ref 65–99)
Glucose-Capillary: 135 mg/dL — ABNORMAL HIGH (ref 65–99)
Glucose-Capillary: 159 mg/dL — ABNORMAL HIGH (ref 65–99)
Glucose-Capillary: 189 mg/dL — ABNORMAL HIGH (ref 65–99)

## 2016-10-11 MED ORDER — INSULIN ASPART 100 UNIT/ML ~~LOC~~ SOLN
0.0000 [IU] | Freq: Every day | SUBCUTANEOUS | Status: DC
  Administered 2016-10-11: 5 [IU] via SUBCUTANEOUS

## 2016-10-11 MED ORDER — INSULIN ASPART 100 UNIT/ML ~~LOC~~ SOLN
0.0000 [IU] | Freq: Three times a day (TID) | SUBCUTANEOUS | Status: DC
  Administered 2016-10-11 (×2): 2 [IU] via SUBCUTANEOUS
  Administered 2016-10-11: 1 [IU] via SUBCUTANEOUS
  Administered 2016-10-12 (×2): 5 [IU] via SUBCUTANEOUS

## 2016-10-11 NOTE — Progress Notes (Signed)
PROGRESS NOTE  [name] [id number] DOB: 1931-08-20 DOA: 10/05/2016 PCP: Limmie Patricia, MD  Brief History:  82 y.o.femalewith a history of the systolic heart failure, last EF 60% in October 2013, diabetes, hypertension, CAD,  presenting to the E acute encephalopathy D with multiple complaints including cough chest congestion, shortness of breath and nasal congestion x 5 days.  The patient was noted to be hypoxic and started on doxycycline for acute respiratory failure due to presumed COPD exacerbation, possibly incited by URI. CTA of the chest was performed and negative for pulmonary embolus. The patient was started on nebulizer treatments in addition to Solu-Medrol and doxycycline.  Due to GI upset, doxycycline was discontinued, and the patient was started on azithromycin and ceftriaxone. It revealed a left lower lobe groundglass opacity. Shortly after admission, the patient developede with serum creatinine that peaked at 2.53. Nephrology was consulted to assist with management.  Renal ultrasound revealed bilateral cortical thinning without hydronephrosis. On 10/08/2016, the patient's respiratory status declined. ABG revealed  pH 7.112/pCO2 99/ pO2 76.9.  Critical care medicine was consulted, and the patient was transferred to ICU.They feel this is likely a combination of OSA/pulm edema/bronchospasm and added solumedrol and BDs Chest x-ray revealed pulmonary vascular congestion, the patient was started on furosemide. Cardiology was consulted at the request of the patient's family.he is an active cardiac issue.   Assessment/Plan: Acute respiratory failure with hypoxia and hypercarbia  -Multifactorial secondary to OSA, CHF, bronchospasm (VCD component) and probable underlying COPD with exacerbation  -Presently stable on 2 L nasal cannula -Wean oxygen for saturation greater than 92% -The patient is a chronic CO2 retainer  Acute on chronic diastolic CHF  -[id number] echo EF  55-60%, grade 2 DD, PASP 47  -Admission weight 221 pounds -Continue intravenous furosemide -I's and O's are not accurate -Revealed medical record shows to outpatient weight approximately 223-224 pounds -Admission 221 pounds, initially gained 7 pounds with fluid resuscitation -BMP in am -Continue metoprolol tartrate  COPD exacerbation with acute bronchitis -Continue duo nebs -Continue steroids -Continue ceftriaxone and azithromycin for 10/11/2016  Acute on chronic renal failure--CKD 3 -Secondary to contrast nephropathy in the setting of ARB induced ATN  -Baseline creatinine 1.1-1.4  -Serum creatinine improving  -Nephrology has signed off  -do not plan to restart ARB at time of d/c due to CKD and risk for recurrent AKI  Acute metabolic encephalopathy -Secondary to hypercarbia -Improved  Atypical chest pain with known history of CAD -Appreciate cardiology consult--> no further workup -Continue aspirin  Essential hypertension -Continue metoprolol tartrate and amlodipine  Diabetes mellitus -10/05/2016 hemoglobin A1c 6.1 -Elevated CBGs secondary to steroid use -Continue have home dose Lantus -NovoLog sliding scale  Bacteriuria -Unclear clinical significance -No pyuria on urinalysis   Disposition Plan:   Home likely 10/13/16 Family Communication:   No Family at bedside--Total time spent 40 minutes.  Greater than 50% spent face to face counseling and coordinating care.  Consultants:  PCCM, cardiology  Code Status:  Partial DNR  DVT Prophylaxis:  [place] Heparin   Procedures: As Listed in Progress Note Above  Antibiotics: Doxy 12/3>>12/4 Ceftriaxone 12/4>>> Azithromycin 12/4>>>    Subjective: Patient is breathing better but continues to have a nonproductive cough. Denies any fevers, chills, chest pain, nausea, vomiting, diarrhea. No abdominal pain. No dysuria or hematuria.  Objective: Vitals:   10/10/16 2137 10/10/16 2207 10/10/16 2212 10/11/16 0500  BP:  (!)  125/102 (!) 120/41   Pulse: 72 66  66   Resp: 20 19    Temp:  98 F (36.7 C)    TempSrc:  Oral    SpO2: 96% 95% 93%   Weight:    100.4 kg (221 lb 5.5 oz)  Height:        Intake/Output Summary (Last 24 hours) at 10/11/16 [id number] Last data filed at 10/10/16 1800  Gross per 24 hour  Intake              600 ml  Output              350 ml  Net              250 ml   Weight change: -1.7 kg (-3 lb 12 oz) Exam:   General:  Pt is alert, follows commands appropriately, not in acute distress  HEENT: No icterus, No thrush, No neck mass, Arnold Line/AT  Cardiovascular: RRR, S1/S2, no rubs, no gallops, mildly elevated JVP  Respiratory: Bibasilar crackles with mild bibasilar wheeze.  Abdomen: Soft/+BS, non tender, non distended, no guarding  Extremities: 1 + LE edema, No lymphangitis, No petechiae, No rashes, no synovitis   Data Reviewed: I have personally reviewed following labs and imaging studies Basic Metabolic Panel:  Recent Labs Lab 10/07/16 0548 10/08/16 0515 10/08/16 1055 10/08/16 1822 10/09/16 0206 10/09/16 0925 10/10/16 0242  NA 134* 133* 135  --  135  --  138  K 5.2* 5.5* 5.5* 4.9 4.6  4.7 4.7 4.4  CL 98* 95* 98*  --  97*  --  97*  CO2 25 30 33*  --  28  --  29  GLUCOSE 239* 175* 171*  --  167*  --  255*  BUN 54* 70* 76*  --  88*  --  98*  CREATININE 2.01* 2.32* 2.52*  --  2.53*  --  2.00*  CALCIUM 8.7* 8.5* 8.5*  --  8.1*  --  8.4*  MG  --   --   --   --  2.6*  --  2.8*  PHOS  --   --   --   --  6.6*  --  4.3   Liver Function Tests:  Recent Labs Lab 10/05/16 1201 10/06/16 0536 10/09/16 0206  AST 22 20  --   ALT 17 16  --   ALKPHOS 78 75  --   BILITOT 0.4 0.5  --   PROT 6.7 6.0*  --   ALBUMIN 3.6 3.1* 3.1*   No results for input(s): LIPASE, AMYLASE in the last 168 hours. No results for input(s): AMMONIA in the last 168 hours. Coagulation Profile: No results for input(s): INR, PROTIME in the last 168 hours. CBC:  Recent Labs Lab 10/05/16 1201  10/06/16 0536 10/09/16 0206 10/10/16 0242  WBC 6.1 5.9 10.7* 10.1  NEUTROABS 4.8  --   --   --   HGB 13.0 11.7* 11.0* 12.0  HCT 42.7 38.5 36.8 38.0  MCV 89.3 89.3 90.4 87.0  PLT 185 204 170 218   Cardiac Enzymes:  Recent Labs Lab 10/08/16 1055  CKTOTAL 210   BNP: Invalid input(s): POCBNP CBG:  Recent Labs Lab 10/10/16 1129 10/10/16 1603 10/10/16 2111 10/10/16 2325 10/11/16 0422  GLUCAP 258* 309* 250* 187* 156*   HbA1C: No results for input(s): HGBA1C in the last 72 hours. Urine analysis:    Component Value Date/Time   COLORURINE YELLOW 10/06/2016 2321   APPEARANCEUR CLOUDY (A) 10/06/2016 2321   LABSPEC 1.022 10/06/2016 2321  PHURINE 5.0 10/06/2016 2321   GLUCOSEU NEGATIVE 10/06/2016 2321   HGBUR NEGATIVE 10/06/2016 2321   BILIRUBINUR NEGATIVE 10/06/2016 2321   KETONESUR NEGATIVE 10/06/2016 2321   PROTEINUR NEGATIVE 10/06/2016 2321   UROBILINOGEN 0.2 03/10/2012 0800   NITRITE NEGATIVE 10/06/2016 2321   LEUKOCYTESUR NEGATIVE 10/06/2016 2321   Sepsis Labs: @LABRCNTIP (procalcitonin:4,lacticidven:4) ) Recent Results (from the past 240 hour(s))  Urine culture     Status: Abnormal   Collection Time: 10/06/16 11:22 PM  Result Value Ref Range Status   Specimen Description URINE, RANDOM  Final   Special Requests NONE  Final   Culture 20,000 COLONIES/mL ESCHERICHIA COLI (A)  Final   Report Status 10/09/2016 FINAL  Final   Organism ID, Bacteria ESCHERICHIA COLI (A)  Final      Susceptibility   Escherichia coli - MIC*    AMPICILLIN >=32 RESISTANT Resistant     CEFAZOLIN >=64 RESISTANT Resistant     CEFTRIAXONE <=1 SENSITIVE Sensitive     CIPROFLOXACIN >=4 RESISTANT Resistant     GENTAMICIN <=1 SENSITIVE Sensitive     IMIPENEM <=0.25 SENSITIVE Sensitive     NITROFURANTOIN <=16 SENSITIVE Sensitive     TRIMETH/SULFA <=20 SENSITIVE Sensitive     AMPICILLIN/SULBACTAM >=32 RESISTANT Resistant     PIP/TAZO 64 INTERMEDIATE Intermediate     Extended ESBL NEGATIVE  Sensitive     * 20,000 COLONIES/mL ESCHERICHIA COLI  Culture, sputum-assessment     Status: None   Collection Time: 10/07/16  5:09 PM  Result Value Ref Range Status   Specimen Description EXPECTORATED SPUTUM  Final   Special Requests NONE  Final   Sputum evaluation   Final    THIS SPECIMEN IS ACCEPTABLE. RESPIRATORY CULTURE REPORT TO FOLLOW.   Report Status 10/07/2016 FINAL  Final  Culture, respiratory (NON-Expectorated)     Status: None   Collection Time: 10/07/16  5:09 PM  Result Value Ref Range Status   Specimen Description SPUTUM  Final   Special Requests NONE  Final   Gram Stain   Final    ABUNDANT WBC PRESENT, PREDOMINANTLY PMN ABUNDANT GRAM POSITIVE COCCI IN PAIRS ABUNDANT GRAM NEGATIVE RODS FEW GRAM NEGATIVE COCCI IN PAIRS FEW GRAM POSITIVE RODS    Culture Consistent with normal respiratory flora.  Final   Report Status 10/10/2016 FINAL  Final  MRSA PCR Screening     Status: None   Collection Time: 10/08/16 11:59 AM  Result Value Ref Range Status   MRSA by PCR NEGATIVE NEGATIVE Final    Comment:        The GeneXpert MRSA Assay (FDA approved for NASAL specimens only), is one component of a comprehensive MRSA colonization surveillance program. It is not intended to diagnose MRSA infection nor to guide or monitor treatment for MRSA infections.      Scheduled Meds: . amLODipine  10 mg Oral Daily  . aspirin EC  81 mg Oral BH-q7a  . atorvastatin  40 mg Oral QPM  . azithromycin  500 mg Oral QPM  . cefTRIAXone (ROCEPHIN)  IV  1 g Intravenous Q24H  . fluticasone  2 spray Each Nare Daily  . furosemide  80 mg Intravenous BID  . gabapentin  600 mg Oral QHS  . heparin  5,000 Units Subcutaneous Q8H  . insulin aspart  0-15 Units Subcutaneous Q4H  . insulin glargine  10 Units Subcutaneous QHS  . ipratropium-albuterol  3 mL Nebulization TID  . methylPREDNISolone (SOLU-MEDROL) injection  40 mg Intravenous Q24H  . metoprolol tartrate  25  mg Oral BID   Continuous  Infusions:  Procedures/Studies: Dg Chest 2 View  Result Date: 10/08/2016 CLINICAL DATA:  Shortness of breath, wheezing EXAM: CHEST  2 VIEW COMPARISON:  10/06/2016 FINDINGS: Cardiomegaly again noted. Central vascular congestion without convincing pulmonary edema. Suboptimal exam due to patient's large body habitus. Mild basilar atelectasis. No segmental infiltrate. Mild degenerative changes thoracic spine. IMPRESSION: Central vascular congestion without convincing pulmonary edema. Mild basilar atelectasis. Cardiomegaly. No segmental infiltrate. Electronically Signed   By: Lahoma Crocker M.D.   On: 10/08/2016 08:27   X-ray Chest Pa And Lateral  Result Date: 10/06/2016 CLINICAL DATA:  Acute respiratory failure.  Shortness of breath. EXAM: CHEST  2 VIEW COMPARISON:  Yesterday. FINDINGS: Interval decreased inspiration and interval enlargement of the cardiac silhouette. Mild increase in prominence of the pulmonary vasculature. Stable mild prominence of the interstitial markings. Thoracic spine degenerative changes. Aortic calcifications. IMPRESSION: 1. Interval mild cardiomegaly, accentuated by a decreased inspiration and the AP technique of the frontal view. 2. Stable mild chronic interstitial lung disease. Electronically Signed   By: Claudie Revering M.D.   On: 10/06/2016 10:26   Dg Chest 2 View  Result Date: 10/05/2016 CLINICAL DATA:  Cough and shortness of breath. EXAM: CHEST  2 VIEW COMPARISON:  August 14, 2015 FINDINGS: The heart size and mediastinal contours are within normal limits. Both lungs are clear. The visualized skeletal structures are unremarkable. IMPRESSION: No active cardiopulmonary disease. Electronically Signed   By: Dorise Bullion III M.D   On: 10/05/2016 13:06   Ct Angio Chest Pe W/cm &/or Wo Cm  Result Date: 10/05/2016 CLINICAL DATA:  Hypoxia and shortness of breath. EXAM: CT ANGIOGRAPHY CHEST WITH CONTRAST TECHNIQUE: Multidetector CT imaging of the chest was performed using the standard  protocol during bolus administration of intravenous contrast. Multiplanar CT image reconstructions and MIPs were obtained to evaluate the vascular anatomy. CONTRAST:  75 mL Isovue 370 COMPARISON:  Chest radiograph 10/05/2016.  Chest CT 04/23/2009 FINDINGS: Cardiovascular: Negative for pulmonary embolism. Coronary artery calcifications. Normal caliber of the thoracic aorta without dissection. Atherosclerotic calcifications in the aorta. There is a bovine arch. Bilateral common carotid arteries are very medial in the lower neck. Main branches of celiac trunk are patent. Mediastinum/Nodes: Small hiatal hernia. Few small mediastinal lymph nodes. Largest node is in the precarinal area measuring 1.1 in the short axis and this previously measured 1.0 cm. Overall, there is not significant chest lymphadenopathy. No significant pericardial fluid. No significant axillary lymphadenopathy. Mild heterogeneity of the thyroid tissue. Lungs/Pleura: Trachea and mainstem bronchi are patent. Subtle ground-glass densities in the left lower lobe on sequence 5, image 81. Otherwise, the lungs are clear without significant airspace disease or lung consolidation. No pleural fluid. Upper Abdomen: No acute abnormality. Musculoskeletal: Degenerative changes in the lower cervical spine. No acute bone abnormality. Review of the MIP images confirms the above findings. IMPRESSION: Negative for pulmonary embolism. No acute chest abnormality. Subtle ground-glass densities in the left lower lobe are nonspecific but could represent atelectasis or old disease. Small hiatal hernia. Electronically Signed   By: Markus Daft M.D.   On: 10/05/2016 14:48   US Renal  Result Date: 10/08/2016 CLINICAL DATA:  Acute kidney injury. History of hypertension, diabetes, and chronic kidney disease. EXAM: RENAL / URINARY TRACT ULTRASOUND COMPLETE COMPARISON:  CT abdomen and pelvis 04/23/2009 FINDINGS: Right Kidney: Length: 11.1 cm. Mild-to-moderate cortical thinning.  Echogenicity within normal limits. No mass or hydronephrosis visualized. Left Kidney: Length: 11.4 cm. Mild cortical thinning. Echogenicity within  normal limits. No mass or hydronephrosis visualized. Bladder: Decompressed and not visualized. IMPRESSION: Bilateral renal cortical thinning.  No hydronephrosis. Electronically Signed   By: Logan Bores M.D.   On: 10/08/2016 09:56   Dg Chest Port 1 View  Result Date: 10/09/2016 CLINICAL DATA:  Respiratory failure EXAM: PORTABLE CHEST 1 VIEW COMPARISON:  Yesterday FINDINGS: EKG leads create artifact over the chest. Stable mild cardiomegaly, accentuated by technique. Low volume chest with interstitial crowding. There is no edema, consolidation, effusion, or pneumothorax. No acute osseous finding. IMPRESSION: Stable from yesterday. Low volume chest without consolidation or edema. Electronically Signed   By: Monte Fantasia M.D.   On: 10/09/2016 07:08    Nedra Mcinnis, DO  [facility] Pager [phone]  If 7PM-7AM, please contact night-coverage [email or web address] Password [email or web address] 10/11/2016, 7:02 AM   LOS: 5 days

## 2016-10-11 NOTE — Progress Notes (Signed)
**  Preliminary report by tech**  Left lower extremity venous duplex complete. There is no evidence of deep or superficial vein thrombosis involving the left lower extremity. All visualized vessels appear patent and compressible. There is no evidence of a Baker's cyst on the left.  10/11/16 2:55 PM Carlos Levering RVT

## 2016-10-11 NOTE — Care Management Important Message (Signed)
Important Message  Patient Details  Name: [name] MRN: [id number] Date of Birth: [birth year]   Medicare Important Message Given:  Yes    Nathen May 10/11/2016, 12:27 PM

## 2016-10-12 DIAGNOSIS — I272 Pulmonary hypertension, unspecified: Secondary | ICD-10-CM

## 2016-10-12 LAB — GLUCOSE, CAPILLARY
Glucose-Capillary: 253 mg/dL — ABNORMAL HIGH (ref 65–99)
Glucose-Capillary: 287 mg/dL — ABNORMAL HIGH (ref 65–99)
Glucose-Capillary: 314 mg/dL — ABNORMAL HIGH (ref 65–99)
Glucose-Capillary: 318 mg/dL — ABNORMAL HIGH (ref 65–99)
Glucose-Capillary: 349 mg/dL — ABNORMAL HIGH (ref 65–99)
Glucose-Capillary: 373 mg/dL — ABNORMAL HIGH (ref 65–99)

## 2016-10-12 LAB — BASIC METABOLIC PANEL
Anion gap: 12 (ref 5–15)
BUN: 80 mg/dL — ABNORMAL HIGH (ref 6–20)
CO2: 33 mmol/L — ABNORMAL HIGH (ref 22–32)
Calcium: 9 mg/dL (ref 8.9–10.3)
Chloride: 97 mmol/L — ABNORMAL LOW (ref 101–111)
Creatinine, Ser: 1.55 mg/dL — ABNORMAL HIGH (ref 0.44–1.00)
GFR calc Af Amer: 35 mL/min — ABNORMAL LOW (ref >60)
GFR calc non Af Amer: 30 mL/min — ABNORMAL LOW (ref >60)
Glucose, Bld: 287 mg/dL — ABNORMAL HIGH (ref 65–99)
Potassium: 5.2 mmol/L — ABNORMAL HIGH (ref 3.5–5.1)
Sodium: 142 mmol/L (ref 135–145)

## 2016-10-12 MED ORDER — FUROSEMIDE 10 MG/ML IJ SOLN
80.0000 mg | Freq: Two times a day (BID) | INTRAMUSCULAR | Status: DC
  Administered 2016-10-12 – 2016-10-13 (×2): 80 mg via INTRAVENOUS
  Filled 2016-10-12 (×2): qty 8

## 2016-10-12 MED ORDER — INSULIN ASPART 100 UNIT/ML ~~LOC~~ SOLN
0.0000 [IU] | Freq: Three times a day (TID) | SUBCUTANEOUS | Status: DC
  Administered 2016-10-12: 8 [IU] via SUBCUTANEOUS
  Administered 2016-10-13: 11 [IU] via SUBCUTANEOUS
  Administered 2016-10-13: 8 [IU] via SUBCUTANEOUS

## 2016-10-12 MED ORDER — METHYLPREDNISOLONE SODIUM SUCC 125 MG IJ SOLR
60.0000 mg | Freq: Three times a day (TID) | INTRAMUSCULAR | Status: AC
  Administered 2016-10-12 – 2016-10-14 (×7): 60 mg via INTRAVENOUS
  Filled 2016-10-12 (×8): qty 2

## 2016-10-12 MED ORDER — INSULIN ASPART 100 UNIT/ML ~~LOC~~ SOLN
0.0000 [IU] | Freq: Every day | SUBCUTANEOUS | Status: DC
  Administered 2016-10-12: 4 [IU] via SUBCUTANEOUS

## 2016-10-12 MED ORDER — INSULIN GLARGINE 100 UNIT/ML ~~LOC~~ SOLN
18.0000 [IU] | Freq: Every day | SUBCUTANEOUS | Status: DC
  Administered 2016-10-12: 18 [IU] via SUBCUTANEOUS
  Filled 2016-10-12 (×2): qty 0.18

## 2016-10-12 MED ORDER — INSULIN ASPART 100 UNIT/ML ~~LOC~~ SOLN
3.0000 [IU] | Freq: Three times a day (TID) | SUBCUTANEOUS | Status: DC
  Administered 2016-10-12 – 2016-10-13 (×3): 3 [IU] via SUBCUTANEOUS

## 2016-10-12 NOTE — Progress Notes (Signed)
PROGRESS NOTE  [name] [id number] DOB: [birth year] DOA: 10/05/2016 PCP: Limmie Patricia, MD  Brief History:  82 y.o.femalewith a history of the systolic heart failure, last EF 60% in October 2013, diabetes, hypertension, CAD,  presenting to the ED with acute encephalopathy with multiple complaints including cough chest congestion, shortness of breath and nasal congestion x 5 days.  The patient was noted to be hypoxic and started on doxycycline for acute respiratory failure due to presumed COPD exacerbation, possibly incited by URI. CTA of the chest was performed and negative for pulmonary embolus. The patient was started on nebulizer treatments in addition to Solu-Medrol and doxycycline.  Due to GI upset, doxycycline was discontinued, and the patient was started on azithromycin and ceftriaxone. It revealed a left lower lobe groundglass opacity. Shortly after admission, the patient developed elevated with serum creatinine that peaked at 2.53. Nephrology was consulted to assist with management.  Renal ultrasound revealed bilateral cortical thinning without hydronephrosis. On 10/08/2016, the patient's respiratory status declined. ABG revealed pH 7.112/pCO2 99/ pO2 76.9.  Critical care medicine was consulted, and the patient was transferred to ICU.They feel this is likely a combination of OSA/pulm edema/bronchospasm and added solumedrol and BDs Chest x-ray revealed pulmonary vascular congestion, the patient was started on furosemide. Cardiology was consulted at the request of the patient's family.  They did not feel pt needed any further cardiac workup.  Assessment/Plan: Acute respiratory failure with hypoxia and hypercarbia  -Multifactorial secondary to OSA, CHF, bronchospasm (VCD component) and probable underlying COPD with exacerbation  -Presently stable on 2 L nasal cannula -Wean oxygen for saturation greater than 92% -The patient is a chronic CO2 retainer  Acute on chronic  diastolic CHF  -[id number] echo EF 55-60%, grade 2 DD, PASP 47  -Admission weight 221 pounds -remains clinically fluid overloaded -Continue intravenous furosemide 80 mg IV bid -I's and O's are not accurate -Revealed medical record shows to outpatient weight approximately 223-224 pounds -Admission 221 pounds, initially gained 7 pounds with fluid resuscitation -BMP in am -Continue metoprolol tartrate  COPD exacerbation with acute bronchitis -Continue duo nebs -increase IV steroids to 60mg   IV q 8 hors -Continue ceftriaxone and azithromycin for 10/11/2016  Acute on chronic renal failure--CKD 3 -Secondary to contrast nephropathy in the setting of ARB induced ATN  -Baseline creatinine 1.1-1.4  -Serum creatinine improving  -Nephrology has signed off  -do not plan to restart ARB at time of d/c due to CKD and risk for recurrent AKI  Acute metabolic encephalopathy -Secondary to hypercarbia -Improved  Atypical chest pain with known history of CAD -Appreciate cardiology consult--> no further workup -Continue aspirin  Essential hypertension -Continue metoprolol tartrate and amlodipine  Diabetes mellitus -10/05/2016 hemoglobin A1c 6.1 -Elevated CBGs secondary to steroid use -Increase Lantus 18 units -NovoLog sliding scale--increase to moderate -novolog 3 units with meals  Bacteriuria -Unclear clinical significance -No pyuria on urinalysis   Disposition Plan:   Home likely 12/11 or 12/12 Family Communication:   No Family at bedside--Total time spent 35 minutes.  Greater than 50% spent face to face counseling and coordinating care.  Consultants:  PCCM, cardiology  Code Status:  Partial DNR  DVT Prophylaxis:  [place] Heparin   Procedures: As Listed in Progress Note Above  Antibiotics: Doxy 12/3>>12/4 Ceftriaxone 12/4>>>12/8 Azithromycin 12/4>>>12/8     Subjective: Overall patient is breathing better but still having dyspnea with exertion. Denies any  fevers, chills, chest pain, nausea, vomiting, diarrhea, abdominal pain.  No dysuria or hematuria. No rashes. Denies any headache or neck pain.  Objective: Vitals:   10/12/16 0508 10/12/16 0807 10/12/16 1512 10/12/16 1514  BP: (!) 157/53   (!) 155/61  Pulse: (!) 58   66  Resp: 20   17  Temp: 98.2 F (36.8 C)   97.4 F (36.3 C)  TempSrc:    Oral  SpO2: 94% 92% 96% 92%  Weight:      Height:        Intake/Output Summary (Last 24 hours) at 10/12/16 1517 Last data filed at 10/12/16 0440  Gross per 24 hour  Intake                0 ml  Output             2100 ml  Net            -2100 ml   Weight change: -0.2 kg (-7.1 oz) Exam:   General:  Pt is alert, follows commands appropriately, not in acute distress  HEENT: No icterus, No thrush, No neck mass, [place]/AT  Cardiovascular: RRR, S1/S2, no rubs, no gallops  Respiratory: Bibasilar crackles. Bilateral basilar wheezing.  Abdomen: Soft/+BS, non tender, non distended, no guarding  Extremities: 1 + LE edema, No lymphangitis, No petechiae, No rashes, no synovitis   Data Reviewed: I have personally reviewed following labs and imaging studies Basic Metabolic Panel:  Recent Labs Lab 10/08/16 0515 10/08/16 1055 10/08/16 1822 10/09/16 0206 10/09/16 0925 10/10/16 0242 10/12/16 0546  NA 133* 135  --  135  --  138 142  K 5.5* 5.5* 4.9 4.6  4.7 4.7 4.4 5.2*  CL 95* 98*  --  97*  --  97* 97*  CO2 30 33*  --  28  --  29 33*  GLUCOSE 175* 171*  --  167*  --  255* 287*  BUN 70* 76*  --  88*  --  98* 80*  CREATININE 2.32* 2.52*  --  2.53*  --  2.00* 1.55*  CALCIUM 8.5* 8.5*  --  8.1*  --  8.4* 9.0  MG  --   --   --  2.6*  --  2.8*  --   PHOS  --   --   --  6.6*  --  4.3  --    Liver Function Tests:  Recent Labs Lab 10/06/16 0536 10/09/16 0206  AST 20  --   ALT 16  --   ALKPHOS 75  --   BILITOT 0.5  --   PROT 6.0*  --   ALBUMIN 3.1* 3.1*   No results for input(s): LIPASE, AMYLASE in the last 168 hours. No results for  input(s): AMMONIA in the last 168 hours. Coagulation Profile: No results for input(s): INR, PROTIME in the last 168 hours. CBC:  Recent Labs Lab 10/06/16 0536 10/09/16 0206 10/10/16 0242  WBC 5.9 10.7* 10.1  HGB 11.7* 11.0* 12.0  HCT 38.5 36.8 38.0  MCV 89.3 90.4 87.0  PLT 204 170 218   Cardiac Enzymes:  Recent Labs Lab 10/08/16 1055  CKTOTAL 210   BNP: Invalid input(s): POCBNP CBG:  Recent Labs Lab 10/11/16 1657 10/12/16 0004 10/12/16 0328 10/12/16 0848 10/12/16 1303  GLUCAP 189* 373* 318* 253* 314*   HbA1C: No results for input(s): HGBA1C in the last 72 hours. Urine analysis:    Component Value Date/Time   COLORURINE YELLOW 10/06/2016 2321   APPEARANCEUR CLOUDY (A) 10/06/2016 2321   LABSPEC 1.022 10/06/2016 2321  PHURINE 5.0 10/06/2016 2321   GLUCOSEU NEGATIVE 10/06/2016 2321   HGBUR NEGATIVE 10/06/2016 2321   BILIRUBINUR NEGATIVE 10/06/2016 2321   KETONESUR NEGATIVE 10/06/2016 2321   PROTEINUR NEGATIVE 10/06/2016 2321   UROBILINOGEN 0.2 03/10/2012 0800   NITRITE NEGATIVE 10/06/2016 2321   LEUKOCYTESUR NEGATIVE 10/06/2016 2321   Sepsis Labs: @LABRCNTIP (procalcitonin:4,lacticidven:4) ) Recent Results (from the past 240 hour(s))  Urine culture     Status: Abnormal   Collection Time: 10/06/16 11:22 PM  Result Value Ref Range Status   Specimen Description URINE, RANDOM  Final   Special Requests NONE  Final   Culture 20,000 COLONIES/mL ESCHERICHIA COLI (A)  Final   Report Status 10/09/2016 FINAL  Final   Organism ID, Bacteria ESCHERICHIA COLI (A)  Final      Susceptibility   Escherichia coli - MIC*    AMPICILLIN >=32 RESISTANT Resistant     CEFAZOLIN >=64 RESISTANT Resistant     CEFTRIAXONE <=1 SENSITIVE Sensitive     CIPROFLOXACIN >=4 RESISTANT Resistant     GENTAMICIN <=1 SENSITIVE Sensitive     IMIPENEM <=0.25 SENSITIVE Sensitive     NITROFURANTOIN <=16 SENSITIVE Sensitive     TRIMETH/SULFA <=20 SENSITIVE Sensitive     AMPICILLIN/SULBACTAM  >=32 RESISTANT Resistant     PIP/TAZO 64 INTERMEDIATE Intermediate     Extended ESBL NEGATIVE Sensitive     * 20,000 COLONIES/mL ESCHERICHIA COLI  Culture, sputum-assessment     Status: None   Collection Time: 10/07/16  5:09 PM  Result Value Ref Range Status   Specimen Description EXPECTORATED SPUTUM  Final   Special Requests NONE  Final   Sputum evaluation   Final    THIS SPECIMEN IS ACCEPTABLE. RESPIRATORY CULTURE REPORT TO FOLLOW.   Report Status 10/07/2016 FINAL  Final  Culture, respiratory (NON-Expectorated)     Status: None   Collection Time: 10/07/16  5:09 PM  Result Value Ref Range Status   Specimen Description SPUTUM  Final   Special Requests NONE  Final   Gram Stain   Final    ABUNDANT WBC PRESENT, PREDOMINANTLY PMN ABUNDANT GRAM POSITIVE COCCI IN PAIRS ABUNDANT GRAM NEGATIVE RODS FEW GRAM NEGATIVE COCCI IN PAIRS FEW GRAM POSITIVE RODS    Culture Consistent with normal respiratory flora.  Final   Report Status 10/10/2016 FINAL  Final  MRSA PCR Screening     Status: None   Collection Time: 10/08/16 11:59 AM  Result Value Ref Range Status   MRSA by PCR NEGATIVE NEGATIVE Final    Comment:        The GeneXpert MRSA Assay (FDA approved for NASAL specimens only), is one component of a comprehensive MRSA colonization surveillance program. It is not intended to diagnose MRSA infection nor to guide or monitor treatment for MRSA infections.      Scheduled Meds: . amLODipine  10 mg Oral Daily  . aspirin EC  81 mg Oral BH-q7a  . atorvastatin  40 mg Oral QPM  . fluticasone  2 spray Each Nare Daily  . furosemide  80 mg Intravenous BID  . furosemide  80 mg Intravenous BID  . gabapentin  600 mg Oral QHS  . heparin  5,000 Units Subcutaneous Q8H  . insulin aspart  0-5 Units Subcutaneous QHS  . insulin aspart  0-9 Units Subcutaneous TID WC  . insulin glargine  10 Units Subcutaneous QHS  . ipratropium-albuterol  3 mL Nebulization TID  . methylPREDNISolone (SOLU-MEDROL)  injection  40 mg Intravenous Q24H  . metoprolol tartrate  25  mg Oral BID   Continuous Infusions:  Procedures/Studies: Dg Chest 2 View  Result Date: 10/08/2016 CLINICAL DATA:  Shortness of breath, wheezing EXAM: CHEST  2 VIEW COMPARISON:  10/06/2016 FINDINGS: Cardiomegaly again noted. Central vascular congestion without convincing pulmonary edema. Suboptimal exam due to patient's large body habitus. Mild basilar atelectasis. No segmental infiltrate. Mild degenerative changes thoracic spine. IMPRESSION: Central vascular congestion without convincing pulmonary edema. Mild basilar atelectasis. Cardiomegaly. No segmental infiltrate. Electronically Signed   By: Lahoma Crocker M.D.   On: 10/08/2016 08:27   X-ray Chest Pa And Lateral  Result Date: 10/06/2016 CLINICAL DATA:  Acute respiratory failure.  Shortness of breath. EXAM: CHEST  2 VIEW COMPARISON:  Yesterday. FINDINGS: Interval decreased inspiration and interval enlargement of the cardiac silhouette. Mild increase in prominence of the pulmonary vasculature. Stable mild prominence of the interstitial markings. Thoracic spine degenerative changes. Aortic calcifications. IMPRESSION: 1. Interval mild cardiomegaly, accentuated by a decreased inspiration and the AP technique of the frontal view. 2. Stable mild chronic interstitial lung disease. Electronically Signed   By: Claudie Revering M.D.   On: 10/06/2016 10:26   Dg Chest 2 View  Result Date: 10/05/2016 CLINICAL DATA:  Cough and shortness of breath. EXAM: CHEST  2 VIEW COMPARISON:  August 14, 2015 FINDINGS: The heart size and mediastinal contours are within normal limits. Both lungs are clear. The visualized skeletal structures are unremarkable. IMPRESSION: No active cardiopulmonary disease. Electronically Signed   By: Dorise Bullion III M.D   On: 10/05/2016 13:06   Ct Angio Chest Pe W/cm &/or Wo Cm  Result Date: 10/05/2016 CLINICAL DATA:  Hypoxia and shortness of breath. EXAM: CT ANGIOGRAPHY CHEST WITH  CONTRAST TECHNIQUE: Multidetector CT imaging of the chest was performed using the standard protocol during bolus administration of intravenous contrast. Multiplanar CT image reconstructions and MIPs were obtained to evaluate the vascular anatomy. CONTRAST:  75 mL Isovue 370 COMPARISON:  Chest radiograph 10/05/2016.  Chest CT 04/23/2009 FINDINGS: Cardiovascular: Negative for pulmonary embolism. Coronary artery calcifications. Normal caliber of the thoracic aorta without dissection. Atherosclerotic calcifications in the aorta. There is a bovine arch. Bilateral common carotid arteries are very medial in the lower neck. Main branches of celiac trunk are patent. Mediastinum/Nodes: Small hiatal hernia. Few small mediastinal lymph nodes. Largest node is in the precarinal area measuring 1.1 in the short axis and this previously measured 1.0 cm. Overall, there is not significant chest lymphadenopathy. No significant pericardial fluid. No significant axillary lymphadenopathy. Mild heterogeneity of the thyroid tissue. Lungs/Pleura: Trachea and mainstem bronchi are patent. Subtle ground-glass densities in the left lower lobe on sequence 5, image 81. Otherwise, the lungs are clear without significant airspace disease or lung consolidation. No pleural fluid. Upper Abdomen: No acute abnormality. Musculoskeletal: Degenerative changes in the lower cervical spine. No acute bone abnormality. Review of the MIP images confirms the above findings. IMPRESSION: Negative for pulmonary embolism. No acute chest abnormality. Subtle ground-glass densities in the left lower lobe are nonspecific but could represent atelectasis or old disease. Small hiatal hernia. Electronically Signed   By: Markus Daft M.D.   On: 10/05/2016 14:48   US Renal  Result Date: 10/08/2016 CLINICAL DATA:  Acute kidney injury. History of hypertension, diabetes, and chronic kidney disease. EXAM: RENAL / URINARY TRACT ULTRASOUND COMPLETE COMPARISON:  CT abdomen and  pelvis 04/23/2009 FINDINGS: Right Kidney: Length: 11.1 cm. Mild-to-moderate cortical thinning. Echogenicity within normal limits. No mass or hydronephrosis visualized. Left Kidney: Length: 11.4 cm. Mild cortical thinning. Echogenicity within normal  limits. No mass or hydronephrosis visualized. Bladder: Decompressed and not visualized. IMPRESSION: Bilateral renal cortical thinning.  No hydronephrosis. Electronically Signed   By: Logan Bores M.D.   On: 10/08/2016 09:56   Dg Chest Port 1 View  Result Date: 10/09/2016 CLINICAL DATA:  Respiratory failure EXAM: PORTABLE CHEST 1 VIEW COMPARISON:  Yesterday FINDINGS: EKG leads create artifact over the chest. Stable mild cardiomegaly, accentuated by technique. Low volume chest with interstitial crowding. There is no edema, consolidation, effusion, or pneumothorax. No acute osseous finding. IMPRESSION: Stable from yesterday. Low volume chest without consolidation or edema. Electronically Signed   By: Monte Fantasia M.D.   On: 10/09/2016 07:08    Nayshawn Mesta, DO  [facility] Pager [phone]  If 7PM-7AM, please contact night-coverage [email or web address] Password [email or web address] 10/12/2016, 3:17 PM   LOS: 6 days

## 2016-10-12 NOTE — Evaluation (Signed)
Physical Therapy Evaluation Patient Details Name: [name] MRN: [id number] DOB: January 03, 1931 Today's Date: 10/12/2016   History of Present Illness  Pt is an 82 y.o. female with PMH consisting of chronic diastolic CHF, last EF [id number] in October 2013, HTN, CAD, anasarca, DM type II controlled with complication, HLD, CKD, spondylolisthesis, and lumbar radiculopathy. She was admitted with dx of acute respiratory failure with hypoxia.  Clinical Impression  Pt admitted with above diagnosis. Pt currently with functional limitations due to the deficits listed below (see PT Problem List). On eval, pt required min guard assist for transfers and gait 150 feet with RW. Gait distance limited by fatigue and DOE.  Pt will benefit from skilled PT to increase their independence and safety with mobility to allow discharge to the venue listed below.       Follow Up Recommendations Home health PT;Supervision/Assistance - 24 hour    Equipment Recommendations  None recommended by PT    Recommendations for Other Services       Precautions / Restrictions Precautions Precautions: Fall;Other (comment) Precaution Comments: watch O2 sats      Mobility  Bed Mobility               General bed mobility comments: Pt received in recliner.  Transfers Overall transfer level: Needs assistance Equipment used: Rolling walker (2 wheeled) Transfers: Sit to/from [facility] Sit to Stand: Min guard Stand pivot transfers: Min guard       General transfer comment: min guard for safety only  Ambulation/Gait Ambulation/Gait assistance: Min guard;+2 safety/equipment Ambulation Distance (Feet): 150 Feet Assistive device: Rolling walker (2 wheeled) Gait Pattern/deviations: Step-through pattern;Decreased stride length Gait velocity: decreased Gait velocity interpretation: Below normal speed for age/gender General Gait Details: +2 utilized for chair follow to maximize gait distance. Pt ambulated on  port O2 beginning at 2 L and increasing to 3 L due to sats at 86%. Sats quickly recover to 92% seated in recliner on 2 L O2.  R knee buckling noted x 1 during gait with pt able to maintain stance with min guard assist.  Stairs            Wheelchair Mobility    Modified Rankin (Stroke Patients Only)       Balance Overall balance assessment: Needs assistance Sitting-balance support: No upper extremity supported;Feet supported Sitting balance-Leahy Scale: Good     Standing balance support: Bilateral upper extremity supported;During functional activity Standing balance-Leahy Scale: Fair Standing balance comment: reliant on RW for ambulation                             Pertinent Vitals/Pain Pain Assessment: No/denies pain    Home Living Family/patient expects to be discharged to:: Private residence Living Arrangements: Spouse/significant other Available Help at Discharge: Family;Available 24 hours/day Type of Home: House Home Access: Stairs to enter Entrance Stairs-Rails: Right;Left;Can reach both Entrance Stairs-Number of Steps: 3 Home Layout: Two level;Able to live on main level with bedroom/bathroom Home Equipment: Bedside commode;Shower seat;Walker - 2 wheels;Cane - single point      Prior Function Level of Independence: Independent with assistive device(s)               Hand Dominance        Extremity/Trunk Assessment                         Communication   Communication: No difficulties  Cognition  Arousal/Alertness: Awake/alert Behavior During Therapy: WFL for tasks assessed/performed Overall Cognitive Status: Within Functional Limits for tasks assessed                      General Comments      Exercises     Assessment/Plan    PT Assessment Patient needs continued PT services  PT Problem List Decreased strength;Decreased activity tolerance;Decreased balance;Cardiopulmonary status limiting activity;Decreased  mobility          PT Treatment Interventions Gait training;Functional mobility training;Stair training;Balance training;Therapeutic exercise;Therapeutic activities;Patient/family education    PT Goals (Current goals can be found in the Care Plan section)  Acute Rehab PT Goals Patient Stated Goal: home PT Goal Formulation: With patient Time For Goal Achievement: 10/26/16 Potential to Achieve Goals: Good    Frequency Min 3X/week   Barriers to discharge        Co-evaluation               End of Session Equipment Utilized During Treatment: Gait belt;Oxygen Activity Tolerance: Patient tolerated treatment well Patient left: in chair;with call bell/phone within reach Nurse Communication: Mobility status         Time: [id number] PT Time Calculation (min) (ACUTE ONLY): 21 min   Charges:   PT Evaluation $PT Eval Moderate Complexity: 1 Procedure     PT G Codes:        Lorriane Shire 10/12/2016, 10:47 AM

## 2016-10-13 LAB — BASIC METABOLIC PANEL
Anion gap: 9 (ref 5–15)
BUN: 68 mg/dL — ABNORMAL HIGH (ref 6–20)
CO2: 33 mmol/L — ABNORMAL HIGH (ref 22–32)
Calcium: 9 mg/dL (ref 8.9–10.3)
Chloride: 94 mmol/L — ABNORMAL LOW (ref 101–111)
Creatinine, Ser: 1.76 mg/dL — ABNORMAL HIGH (ref 0.44–1.00)
GFR calc Af Amer: 30 mL/min — ABNORMAL LOW (ref >60)
GFR calc non Af Amer: 26 mL/min — ABNORMAL LOW (ref >60)
Glucose, Bld: 362 mg/dL — ABNORMAL HIGH (ref 65–99)
Potassium: 4.5 mmol/L (ref 3.5–5.1)
Sodium: 136 mmol/L (ref 135–145)

## 2016-10-13 LAB — GLUCOSE, CAPILLARY
Glucose-Capillary: 295 mg/dL — ABNORMAL HIGH (ref 65–99)
Glucose-Capillary: 334 mg/dL — ABNORMAL HIGH (ref 65–99)
Glucose-Capillary: 427 mg/dL — ABNORMAL HIGH (ref 65–99)
Glucose-Capillary: 364 mg/dL — ABNORMAL HIGH (ref 65–99)

## 2016-10-13 MED ORDER — INSULIN ASPART 100 UNIT/ML ~~LOC~~ SOLN
0.0000 [IU] | Freq: Three times a day (TID) | SUBCUTANEOUS | Status: DC
  Administered 2016-10-14: 15 [IU] via SUBCUTANEOUS
  Administered 2016-10-14: 7 [IU] via SUBCUTANEOUS
  Administered 2016-10-14: 20 [IU] via SUBCUTANEOUS
  Administered 2016-10-15 (×2): 4 [IU] via SUBCUTANEOUS

## 2016-10-13 MED ORDER — INSULIN GLARGINE 100 UNIT/ML ~~LOC~~ SOLN
28.0000 [IU] | Freq: Every day | SUBCUTANEOUS | Status: DC
  Filled 2016-10-13: qty 0.28

## 2016-10-13 MED ORDER — INSULIN ASPART 100 UNIT/ML ~~LOC~~ SOLN
28.0000 [IU] | Freq: Once | SUBCUTANEOUS | Status: AC
  Administered 2016-10-13: 28 [IU] via SUBCUTANEOUS

## 2016-10-13 MED ORDER — INSULIN ASPART 100 UNIT/ML ~~LOC~~ SOLN
0.0000 [IU] | Freq: Every day | SUBCUTANEOUS | Status: DC
  Administered 2016-10-13: 5 [IU] via SUBCUTANEOUS

## 2016-10-13 MED ORDER — FUROSEMIDE 80 MG PO TABS
80.0000 mg | ORAL_TABLET | Freq: Every day | ORAL | Status: DC
  Administered 2016-10-14 – 2016-10-15 (×2): 80 mg via ORAL
  Filled 2016-10-13 (×2): qty 1

## 2016-10-13 MED ORDER — INSULIN GLARGINE 100 UNIT/ML ~~LOC~~ SOLN
38.0000 [IU] | Freq: Every day | SUBCUTANEOUS | Status: DC
  Administered 2016-10-13 – 2016-10-14 (×2): 38 [IU] via SUBCUTANEOUS
  Filled 2016-10-13 (×3): qty 0.38

## 2016-10-13 MED ORDER — INSULIN ASPART 100 UNIT/ML ~~LOC~~ SOLN
5.0000 [IU] | Freq: Three times a day (TID) | SUBCUTANEOUS | Status: DC
  Administered 2016-10-14 – 2016-10-15 (×5): 5 [IU] via SUBCUTANEOUS

## 2016-10-13 NOTE — Progress Notes (Signed)
PROGRESS NOTE  [name] [id number] DOB: [birth year] DOA: 10/05/2016 PCP: Limmie Patricia, MD  Brief History: 82 y.o.femalewith a history of the systolicheart failure, last EF 60% in October 2013, diabetes, hypertension, CAD, presenting to the ED withacute encephalopathy with multiple complaints including cough chest congestion,shortness of breath and nasal congestion x 5 days. The patient was noted to be hypoxic and started on doxycycline for acute respiratory failure due to presumed COPD exacerbation, possibly incited by URI. CTA of the chestwas performed and negative for pulmonary embolus. The patient was started on nebulizer treatments in addition to Solu-Medrol and doxycycline. Due to GI upset, doxycycline was discontinued, and the patient was started on azithromycin and ceftriaxone. It revealed a left lower lobe groundglass opacity. Shortly after admission, the patient developed elevated with serum creatinine that peaked at 2.53. Nephrology was consulted to assist with management. Renal ultrasound revealed bilateral cortical thinning without hydronephrosis. On 10/08/2016, the patient's respiratory status declined. ABG revealed pH 7.112/pCO2 99/ pO2 76.9. Critical care medicine was consulted, and the patient was transferred to ICU.They feel this is likely a combination of OSA/pulm edema/bronchospasm and added solumedrol and BDsChest x-ray revealed pulmonary vascular congestion, the patient was started on furosemide. Cardiology was consulted at the request of the patient's family.  They did not feel pt needed any further cardiac workup.  Assessment/Plan: Acute respiratory failure with hypoxia and hypercarbia  -Multifactorial secondary to OSA, CHF, bronchospasm (VCD component)and probable underlying COPD with exacerbation  -Presently stable on 2 L nasal cannula -Wean oxygen for saturation greater than 92% -The patient is a chronic CO2 retainer  Acute on chronic  diastolic CHF  -[id number] echo EF 55-60%, grade 2 DD, PASP 47  -Admission weight 221 pounds -appears more euvolemic -Continue intravenous furosemide 80 mg IV bid-->po lasix on 12/11 -I's and O's are not accurate -Revealed medical record shows to outpatient weight approximately 223-224 pounds -Admission221 pounds, initially gained 7 pounds with fluid resuscitation -BMP in am -Continue metoprolol tartrate  COPD exacerbation with acute bronchitis -Continue duo nebs -continue IV steroids to 60mg   IV q 8 hors -Finished ceftriaxone and azithromycin 10/11/2016  Acute on chronic renal failure--CKD 3 -Secondary to contrast nephropathy in the setting of ARB induced ATN  -Baseline creatinine 1.1-1.4  -Serum creatinine improving  -Nephrology has signed off  -do not plan to restart ARB at time of d/c due to CKD and risk for recurrent AKI  Acute metabolic encephalopathy -Secondary to hypercarbia -Improved  Atypical chest pain with known history of CAD -Appreciate cardiology consult-->no further workup -Continue aspirin  Essential hypertension -Continue metoprolol tartrate and amlodipine  Diabetes mellitus -10/05/2016 hemoglobin A1c 6.1 -Elevated CBGs secondary to steroid use -Increase Lantus 28 units -NovoLog sliding scale--increase to resistant scale -novolog 3 units with meals  Bacteriuria -Unclear clinical significance -No pyuria on urinalysis   Disposition Plan: Home likely 12/11 or 12/12 Family Communication: No Family at bedside--Total time spent 24minutes. Greater than 50% spent face to face counseling and coordinating care.  Consultants: PCCM, cardiology  Code Status: PartialDNR  DVT Prophylaxis: Rowena Heparin   Procedures: As Listed in Progress Note Above  Antibiotics: Doxy 12/3>>12/4 Ceftriaxone 12/4>>>12/8 Azithromycin 12/4>>>12/8   Subjective: Overall, patient is breathing better but still has some dyspnea on exertion. Denies  any nausea, vomiting, diarrhea,, pain. Denies any fevers, chills, chest pain, headache, neck pain.  Objective: Vitals:   10/13/16 0501 10/13/16 0936 10/13/16 1327 10/13/16 1440  BP: (!) 129/56  Marland Kitchen)  159/65   Pulse: 62  64   Resp: 20  17   Temp: 98.2 F (36.8 C)  97.8 F (36.6 C)   TempSrc: Oral  Oral   SpO2: 98% 98% 92% 96%  Weight: 100.4 kg (221 lb 5.5 oz)     Height:       No intake or output data in the 24 hours ending 10/13/16 1621 Weight change: 0.2 kg (7.1 oz) Exam:   General:  Pt is alert, follows commands appropriately, not in acute distress  HEENT: No icterus, No thrush, No neck mass, [place]/AT  Cardiovascular: RRR, S1/S2, no rubs, no gallops  Respiratory:Bibasilar expiratory wheeze. Good air movement.  Abdomen: Soft/+BS, non tender, non distended, no guarding  Extremities: 1 + LE edema, No lymphangitis, No petechiae, No rashes, no synovitis   Data Reviewed: I have personally reviewed following labs and imaging studies Basic Metabolic Panel:  Recent Labs Lab 10/08/16 1055  10/09/16 0206 10/09/16 0925 10/10/16 0242 10/12/16 0546 10/13/16 1303  NA 135  --  135  --  138 142 136  K 5.5*  < > 4.6  4.7 4.7 4.4 5.2* 4.5  CL 98*  --  97*  --  97* 97* 94*  CO2 33*  --  28  --  29 33* 33*  GLUCOSE 171*  --  167*  --  255* 287* 362*  BUN 76*  --  88*  --  98* 80* 68*  CREATININE 2.52*  --  2.53*  --  2.00* 1.55* 1.76*  CALCIUM 8.5*  --  8.1*  --  8.4* 9.0 9.0  MG  --   --  2.6*  --  2.8*  --   --   PHOS  --   --  6.6*  --  4.3  --   --   < > = values in this interval not displayed. Liver Function Tests:  Recent Labs Lab 10/09/16 0206  ALBUMIN 3.1*   No results for input(s): LIPASE, AMYLASE in the last 168 hours. No results for input(s): AMMONIA in the last 168 hours. Coagulation Profile: No results for input(s): INR, PROTIME in the last 168 hours. CBC:  Recent Labs Lab 10/09/16 0206 10/10/16 0242  WBC 10.7* 10.1  HGB 11.0* 12.0  HCT 36.8 38.0    MCV 90.4 87.0  PLT 170 218   Cardiac Enzymes:  Recent Labs Lab 10/08/16 1055  CKTOTAL 210   BNP: Invalid input(s): POCBNP CBG:  Recent Labs Lab 10/12/16 1303 10/12/16 1804 10/12/16 2038 10/13/16 0809 10/13/16 1235  GLUCAP 314* 287* 349* 295* 334*   HbA1C: No results for input(s): HGBA1C in the last 72 hours. Urine analysis:    Component Value Date/Time   COLORURINE YELLOW 10/06/2016 2321   APPEARANCEUR CLOUDY (A) 10/06/2016 2321   LABSPEC 1.022 10/06/2016 2321   PHURINE 5.0 10/06/2016 2321   GLUCOSEU NEGATIVE 10/06/2016 2321   HGBUR NEGATIVE 10/06/2016 2321   BILIRUBINUR NEGATIVE 10/06/2016 2321   KETONESUR NEGATIVE 10/06/2016 2321   PROTEINUR NEGATIVE 10/06/2016 2321   UROBILINOGEN 0.2 03/10/2012 0800   NITRITE NEGATIVE 10/06/2016 2321   LEUKOCYTESUR NEGATIVE 10/06/2016 2321   Sepsis Labs: @LABRCNTIP (procalcitonin:4,lacticidven:4) ) Recent Results (from the past 240 hour(s))  Urine culture     Status: Abnormal   Collection Time: 10/06/16 11:22 PM  Result Value Ref Range Status   Specimen Description URINE, RANDOM  Final   Special Requests NONE  Final   Culture 20,000 COLONIES/mL ESCHERICHIA COLI (A)  Final   Report  Status 10/09/2016 FINAL  Final   Organism ID, Bacteria ESCHERICHIA COLI (A)  Final      Susceptibility   Escherichia coli - MIC*    AMPICILLIN >=32 RESISTANT Resistant     CEFAZOLIN >=64 RESISTANT Resistant     CEFTRIAXONE <=1 SENSITIVE Sensitive     CIPROFLOXACIN >=4 RESISTANT Resistant     GENTAMICIN <=1 SENSITIVE Sensitive     IMIPENEM <=0.25 SENSITIVE Sensitive     NITROFURANTOIN <=16 SENSITIVE Sensitive     TRIMETH/SULFA <=20 SENSITIVE Sensitive     AMPICILLIN/SULBACTAM >=32 RESISTANT Resistant     PIP/TAZO 64 INTERMEDIATE Intermediate     Extended ESBL NEGATIVE Sensitive     * 20,000 COLONIES/mL ESCHERICHIA COLI  Culture, sputum-assessment     Status: None   Collection Time: 10/07/16  5:09 PM  Result Value Ref Range Status    Specimen Description EXPECTORATED SPUTUM  Final   Special Requests NONE  Final   Sputum evaluation   Final    THIS SPECIMEN IS ACCEPTABLE. RESPIRATORY CULTURE REPORT TO FOLLOW.   Report Status 10/07/2016 FINAL  Final  Culture, respiratory (NON-Expectorated)     Status: None   Collection Time: 10/07/16  5:09 PM  Result Value Ref Range Status   Specimen Description SPUTUM  Final   Special Requests NONE  Final   Gram Stain   Final    ABUNDANT WBC PRESENT, PREDOMINANTLY PMN ABUNDANT GRAM POSITIVE COCCI IN PAIRS ABUNDANT GRAM NEGATIVE RODS FEW GRAM NEGATIVE COCCI IN PAIRS FEW GRAM POSITIVE RODS    Culture Consistent with normal respiratory flora.  Final   Report Status 10/10/2016 FINAL  Final  MRSA PCR Screening     Status: None   Collection Time: 10/08/16 11:59 AM  Result Value Ref Range Status   MRSA by PCR NEGATIVE NEGATIVE Final    Comment:        The GeneXpert MRSA Assay (FDA approved for NASAL specimens only), is one component of a comprehensive MRSA colonization surveillance program. It is not intended to diagnose MRSA infection nor to guide or monitor treatment for MRSA infections.      Scheduled Meds: . amLODipine  10 mg Oral Daily  . aspirin EC  81 mg Oral BH-q7a  . atorvastatin  40 mg Oral QPM  . fluticasone  2 spray Each Nare Daily  . [START ON 10/14/2016] furosemide  80 mg Oral Daily  . gabapentin  600 mg Oral QHS  . heparin  5,000 Units Subcutaneous Q8H  . insulin aspart  0-15 Units Subcutaneous TID WC  . insulin aspart  0-5 Units Subcutaneous QHS  . insulin aspart  3 Units Subcutaneous TID WC  . insulin glargine  28 Units Subcutaneous QHS  . ipratropium-albuterol  3 mL Nebulization TID  . methylPREDNISolone (SOLU-MEDROL) injection  60 mg Intravenous Q8H  . metoprolol tartrate  25 mg Oral BID   Continuous Infusions:  Procedures/Studies: Dg Chest 2 View  Result Date: 10/08/2016 CLINICAL DATA:  Shortness of breath, wheezing EXAM: CHEST  2 VIEW  COMPARISON:  10/06/2016 FINDINGS: Cardiomegaly again noted. Central vascular congestion without convincing pulmonary edema. Suboptimal exam due to patient's large body habitus. Mild basilar atelectasis. No segmental infiltrate. Mild degenerative changes thoracic spine. IMPRESSION: Central vascular congestion without convincing pulmonary edema. Mild basilar atelectasis. Cardiomegaly. No segmental infiltrate. Electronically Signed   By: Lahoma Crocker M.D.   On: 10/08/2016 08:27   X-ray Chest Pa And Lateral  Result Date: 10/06/2016 CLINICAL DATA:  Acute respiratory failure.  Shortness  of breath. EXAM: CHEST  2 VIEW COMPARISON:  Yesterday. FINDINGS: Interval decreased inspiration and interval enlargement of the cardiac silhouette. Mild increase in prominence of the pulmonary vasculature. Stable mild prominence of the interstitial markings. Thoracic spine degenerative changes. Aortic calcifications. IMPRESSION: 1. Interval mild cardiomegaly, accentuated by a decreased inspiration and the AP technique of the frontal view. 2. Stable mild chronic interstitial lung disease. Electronically Signed   By: Claudie Revering M.D.   On: 10/06/2016 10:26   Dg Chest 2 View  Result Date: 10/05/2016 CLINICAL DATA:  Cough and shortness of breath. EXAM: CHEST  2 VIEW COMPARISON:  August 14, 2015 FINDINGS: The heart size and mediastinal contours are within normal limits. Both lungs are clear. The visualized skeletal structures are unremarkable. IMPRESSION: No active cardiopulmonary disease. Electronically Signed   By: Dorise Bullion III M.D   On: 10/05/2016 13:06   Ct Angio Chest Pe W/cm &/or Wo Cm  Result Date: 10/05/2016 CLINICAL DATA:  Hypoxia and shortness of breath. EXAM: CT ANGIOGRAPHY CHEST WITH CONTRAST TECHNIQUE: Multidetector CT imaging of the chest was performed using the standard protocol during bolus administration of intravenous contrast. Multiplanar CT image reconstructions and MIPs were obtained to evaluate the  vascular anatomy. CONTRAST:  75 mL Isovue 370 COMPARISON:  Chest radiograph 10/05/2016.  Chest CT 04/23/2009 FINDINGS: Cardiovascular: Negative for pulmonary embolism. Coronary artery calcifications. Normal caliber of the thoracic aorta without dissection. Atherosclerotic calcifications in the aorta. There is a bovine arch. Bilateral common carotid arteries are very medial in the lower neck. Main branches of celiac trunk are patent. Mediastinum/Nodes: Small hiatal hernia. Few small mediastinal lymph nodes. Largest node is in the precarinal area measuring 1.1 in the short axis and this previously measured 1.0 cm. Overall, there is not significant chest lymphadenopathy. No significant pericardial fluid. No significant axillary lymphadenopathy. Mild heterogeneity of the thyroid tissue. Lungs/Pleura: Trachea and mainstem bronchi are patent. Subtle ground-glass densities in the left lower lobe on sequence 5, image 81. Otherwise, the lungs are clear without significant airspace disease or lung consolidation. No pleural fluid. Upper Abdomen: No acute abnormality. Musculoskeletal: Degenerative changes in the lower cervical spine. No acute bone abnormality. Review of the MIP images confirms the above findings. IMPRESSION: Negative for pulmonary embolism. No acute chest abnormality. Subtle ground-glass densities in the left lower lobe are nonspecific but could represent atelectasis or old disease. Small hiatal hernia. Electronically Signed   By: Markus Daft M.D.   On: 10/05/2016 14:48   US Renal  Result Date: 10/08/2016 CLINICAL DATA:  Acute kidney injury. History of hypertension, diabetes, and chronic kidney disease. EXAM: RENAL / URINARY TRACT ULTRASOUND COMPLETE COMPARISON:  CT abdomen and pelvis 04/23/2009 FINDINGS: Right Kidney: Length: 11.1 cm. Mild-to-moderate cortical thinning. Echogenicity within normal limits. No mass or hydronephrosis visualized. Left Kidney: Length: 11.4 cm. Mild cortical thinning. Echogenicity  within normal limits. No mass or hydronephrosis visualized. Bladder: Decompressed and not visualized. IMPRESSION: Bilateral renal cortical thinning.  No hydronephrosis. Electronically Signed   By: Logan Bores M.D.   On: 10/08/2016 09:56   Dg Chest Port 1 View  Result Date: 10/09/2016 CLINICAL DATA:  Respiratory failure EXAM: PORTABLE CHEST 1 VIEW COMPARISON:  Yesterday FINDINGS: EKG leads create artifact over the chest. Stable mild cardiomegaly, accentuated by technique. Low volume chest with interstitial crowding. There is no edema, consolidation, effusion, or pneumothorax. No acute osseous finding. IMPRESSION: Stable from yesterday. Low volume chest without consolidation or edema. Electronically Signed   By: Neva Seat.D.  On: 10/09/2016 07:08    Miriana Gaertner, DO  [facility] Pager [phone]  If 7PM-7AM, please contact night-coverage [email or web address] Password [email or web address] 10/13/2016, 4:21 PM   LOS: 7 days

## 2016-10-14 DIAGNOSIS — E118 Type 2 diabetes mellitus with unspecified complications: Secondary | ICD-10-CM

## 2016-10-14 DIAGNOSIS — Z794 Long term (current) use of insulin: Secondary | ICD-10-CM

## 2016-10-14 LAB — BASIC METABOLIC PANEL
Anion gap: 11 (ref 5–15)
BUN: 64 mg/dL — ABNORMAL HIGH (ref 6–20)
CO2: 32 mmol/L (ref 22–32)
Calcium: 9.2 mg/dL (ref 8.9–10.3)
Chloride: 95 mmol/L — ABNORMAL LOW (ref 101–111)
Creatinine, Ser: 1.65 mg/dL — ABNORMAL HIGH (ref 0.44–1.00)
GFR calc Af Amer: 32 mL/min — ABNORMAL LOW (ref >60)
GFR calc non Af Amer: 28 mL/min — ABNORMAL LOW (ref >60)
Glucose, Bld: 258 mg/dL — ABNORMAL HIGH (ref 65–99)
Potassium: 4.4 mmol/L (ref 3.5–5.1)
Sodium: 138 mmol/L (ref 135–145)

## 2016-10-14 LAB — GLUCOSE, CAPILLARY
Glucose-Capillary: 362 mg/dL — ABNORMAL HIGH (ref 65–99)
Glucose-Capillary: 250 mg/dL — ABNORMAL HIGH (ref 65–99)
Glucose-Capillary: 322 mg/dL — ABNORMAL HIGH (ref 65–99)
Glucose-Capillary: 378 mg/dL — ABNORMAL HIGH (ref 65–99)
Glucose-Capillary: 503 mg/dL (ref 65–99)

## 2016-10-14 MED ORDER — INSULIN ASPART 100 UNIT/ML ~~LOC~~ SOLN
15.0000 [IU] | Freq: Once | SUBCUTANEOUS | Status: AC
  Administered 2016-10-14: 15 [IU] via SUBCUTANEOUS

## 2016-10-14 MED ORDER — FLUTICASONE PROPIONATE 50 MCG/ACT NA SUSP: 2.0000 | Freq: Every day | NASAL | 1 refills | Status: DC

## 2016-10-14 MED ORDER — PREDNISONE 50 MG PO TABS
60.0000 mg | ORAL_TABLET | Freq: Every day | ORAL | Status: DC
  Administered 2016-10-15: 08:00:00 60 mg via ORAL
  Filled 2016-10-14: qty 1

## 2016-10-14 NOTE — Progress Notes (Signed)
Physical Therapy Treatment Patient Details Name: [name] MRN: [id number] DOB: 03-Mar-1931 Today's Date: 10/14/2016    History of Present Illness Pt is an 82 y.o. female with PMH consisting of chronic diastolic CHF, last EF [id number] in October 2013, HTN, CAD, anasarca, DM type II controlled with complication, HLD, CKD, spondylolisthesis, and lumbar radiculopathy. She was admitted with dx of acute respiratory failure with hypoxia.    PT Comments    Pt able to advance activity but remains to require supplemental O2 to maintain saturations greater than 90%.  Will continue to recommend HHPT with 24 hour assistance.  This may be difficult as her husband is hospitalized on the same unit.   Follow Up Recommendations  Home health PT;Supervision/Assistance - 24 hour     Equipment Recommendations  None recommended by PT    Recommendations for Other Services       Precautions / Restrictions Precautions Precautions: Fall;Other (comment) Precaution Comments: watch O2 sats Restrictions Weight Bearing Restrictions: No    Mobility  Bed Mobility               General bed mobility comments: Pt received in recliner.  Transfers Overall transfer level: Needs assistance Equipment used: Rolling walker (2 wheeled) Transfers: Sit to/from Stand Sit to Stand: Min guard Stand pivot transfers: Min guard       General transfer comment: Cues for hand placement to and from seated surface.  Pt reaches for RW intially and pushes RW to the side when backing.  Pt demonstrated LOB x1 when backing to chair and required education to keep RW close to her body when backing to seated surface.    Ambulation/Gait Ambulation/Gait assistance: Min guard Ambulation Distance (Feet): 200 Feet Assistive device: Rolling walker (2 wheeled) Gait Pattern/deviations: Step-through pattern;Decreased stride length Gait velocity: decreased   General Gait Details: Pt did not require a chair follow but did require  standing break x 4 due to fatigue and decreased in O2 saturations.  Pt intially on RA and decreased to 86%, PTA applied 1L and O2 improved to 89% on 1L, pt then required 2L to maintain O2 saturations greater than 90%.  On 2L 93%-97%.   (Cues for R heel strike to decrease buckling with R knee in stance phase.  Pt with no buckling during gait with this correction.  )   Stairs            Wheelchair Mobility    Modified Rankin (Stroke Patients Only)       Balance     Sitting balance-Leahy Scale: Good       Standing balance-Leahy Scale: Fair                      Cognition Arousal/Alertness: Awake/alert Behavior During Therapy: WFL for tasks assessed/performed Overall Cognitive Status: Within Functional Limits for tasks assessed                      Exercises      General Comments        Pertinent Vitals/Pain Pain Assessment: No/denies pain    Home Living                      Prior Function            PT Goals (current goals can now be found in the care plan section) Acute Rehab PT Goals Patient Stated Goal: home Potential to Achieve Goals: Good Progress towards PT goals:  Progressing toward goals    Frequency    Min 3X/week      PT Plan Current plan remains appropriate    Co-evaluation             End of Session Equipment Utilized During Treatment: Gait belt;Oxygen Activity Tolerance: Patient tolerated treatment well Patient left: in chair;with call bell/phone within reach     Time: 1432-1511 PT Time Calculation (min) (ACUTE ONLY): 39 min  Charges:  $Gait Training: 8-22 mins $Therapeutic Activity: 8-22 mins                    G Codes:      Cristela Blue October 31, 2016, 3:40 PM  Governor Rooks, PTA pager [phone]

## 2016-10-14 NOTE — Discharge Summary (Addendum)
Physician Discharge Summary  [name] [id number] DOB: 80-Jan-1932 DOA: 10/05/2016  PCP: Limmie Patricia, MD  Admit date: 10/05/2016 Discharge date: 10/15/16  Admitted From: Home Disposition:  Home  Recommendations for Outpatient Follow-up:  1. Follow up with PCP in 1-2 weeks 2. Please obtain BMP/CBC in one week 3. Please follow up on the following pending results:  Home Health:YES Equipment/Devices:HHPT and 2L [place]  Discharge Condition:Stable CODE STATUS: FULL Diet recommendation: Heart Healthy    Brief/Interim Summary:  82 y.o.femalewith a history of the systolicheart failure, last EF 60% in October 2013, diabetes, hypertension, CAD, presenting to the ED withacute encephalopathy with multiple complaints including cough chest congestion,shortness of breath and nasal congestion x 5 days. The patient was noted to be hypoxic and started on doxycycline for acute respiratory failure due to presumed COPD exacerbation, possibly incited by URI. CTA of the chestwas performed and negative for pulmonary embolus. The patient was started on nebulizer treatments in addition to Solu-Medrol and doxycycline. Due to GI upset, doxycycline was discontinued, and the patient was started on azithromycin and ceftriaxone. It revealed a left lower lobe groundglass opacity. Shortly after admission, the patient developed elevatedwith serum creatinine that peaked at 2.53. Nephrology was consulted to assist with management. Renal ultrasound revealed bilateral cortical thinning without hydronephrosis. On 10/08/2016, the patient's respiratory status declined. ABG revealed pH 7.112/pCO2 99/ pO2 76.9. Critical care medicine was consulted, and the patient was transferred to ICU.They feel this is likely a combination of OSA/pulm edema/bronchospasm and added solumedrol and BDsChest x-ray revealed pulmonary vascular congestion, the patient was started on furosemide. Cardiology was consulted at the request  of the patient's family. They did not feel pt needed any further cardiac workup. Discharge Diagnoses:  Acute respiratory failure with hypoxia and hypercarbia  -Multifactorial secondary to OSA, CHF, bronchospasm (VCD component)and probable underlying COPD with exacerbation ---VCD=vocal cord dysfunction -Presently stable on 2 L nasal cannula -Wean oxygen for saturation greater than 92% -The patient is a chronic CO2 retainer -ambulatory pulse ox showed de-saturation-->home with 2L  Acute on chronic diastolic CHF  -[id number] echo EF 55-60%, grade 2 DD, PASP 47  -Admission weight 221 pounds -appears more euvolemic -Continue intravenous furosemide 80 mg IV bid-->po lasix on 12/11-->home with lasix 80 mg daily -I's and O's are not accurate -Revealed medical record shows to outpatient weight approximately 223-224 pounds -discharge weight 224 -Continue metoprolol tartrate -d/c Actos in setting of acute on chronic CHF-->folllow up with endocrinologist for further adjustment to DM regimen -will not restart valsartan in setting of CKD  COPD exacerbation with acute bronchitis -Continue duo nebs -continueIVsteroids to 60mg  IV q 8 hours--transition to po prednisone-->home with prednisone taper--60-->50-->40-->30-->20-->10 -Finishedceftriaxone and azithromycin 10/11/2016  Acute on chronic renal failure--CKD 3 -Secondary to contrast nephropathy in the setting of ARB induced ATN  -Baseline creatinine 1.1-1.4  -Serum creatinine improving  -Nephrology has signed off  -do not plan to restart ARB at time of d/c due to CKD and risk for recurrent AKI  Acute metabolic encephalopathy -Secondary to hypercarbia -Improved  Atypical chest pain with known history of CAD -Appreciate cardiology consult-->no further workup -Continue aspirin  Essential hypertension -Continue metoprolol tartrate and amlodipine  Diabetes mellitus -10/05/2016 hemoglobin A1c 6.1 -Elevated CBGs secondary to  steroid use -IncreasedLantus 38 units--plan to decrease as steroid doses decrease -NovoLog sliding scale--increase to resistant scale -novolog 5 units with meals  Bacteriuria -Unclear clinical significance -No pyuria on urinalysis   Discharge Instructions  Discharge Instructions    Diet - low sodium  heart healthy    Complete by:  As directed    Increase activity slowly    Complete by:  As directed        Medication List    STOP taking these medications   albuterol 108 (90 Base) MCG/ACT inhaler Commonly known as:  PROVENTIL HFA;VENTOLIN HFA   pioglitazone 15 MG tablet Commonly known as:  ACTOS   valsartan 320 MG tablet Commonly known as:  DIOVAN     TAKE these medications   amLODipine 10 MG tablet Commonly known as:  NORVASC Take 10 mg by mouth daily.   aspirin EC 81 MG tablet Take 81 mg by mouth every morning.   atorvastatin 80 MG tablet Commonly known as:  LIPITOR Take 40 mg by mouth every evening.   ergocalciferol 50000 units capsule Commonly known as:  VITAMIN D2 Take 50,000 Units by mouth once a week. On friday   fluticasone 50 MCG/ACT nasal spray Commonly known as:  FLONASE Place 2 sprays into both nostrils daily.   furosemide 80 MG tablet Commonly known as:  LASIX Take 1 tablet (80 mg total) by mouth daily.   gabapentin 300 MG capsule Commonly known as:  NEURONTIN 2 TABLETS BY MOUTH AT BEDTIME   GLIPIZIDE XL 5 MG 24 hr tablet Generic drug:  glipiZIDE Take 5 mg by mouth Daily.   guaiFENesin 100 MG/5ML liquid Commonly known as:  ROBITUSSIN Take 5-10 mLs (100-200 mg total) by mouth every 4 (four) hours as needed for cough.   insulin aspart 100 UNIT/ML injection Commonly known as:  novoLOG Inject into the skin as directed. Based on a sliding scale. Inject 3-4 units into the skin daily with breakfast, inject 3-4 units into the skin with lunch. Inject 8 units into the skin daily with dinner.   insulin glargine 100 UNIT/ML injection Commonly  known as:  LANTUS Inject 20 Units into the skin at bedtime.   IRON PO Take 1 tablet by mouth daily.   metoprolol tartrate 25 MG tablet Commonly known as:  LOPRESSOR Take 25 mg by mouth 2 (two) times daily.   predniSONE 10 MG tablet Commonly known as:  DELTASONE Take 6 tablets (60 mg total) by mouth daily with breakfast. And decrease by one tablet daily Start taking on:  10/16/2016   SYSTANE 0.4-0.3 % Soln Generic drug:  Polyethyl Glycol-Propyl Glycol Apply 2 drops to eye daily as needed.            Durable Medical Equipment        Start     Ordered   10/15/16 1144  For home use only DME oxygen  Once    Question Answer Comment  Mode or (Route) Nasal cannula   Liters per Minute 2   Frequency Continuous (stationary and portable oxygen unit needed)   Oxygen conserving device Yes   Oxygen delivery system Gas      10/15/16 1143     Follow-up Information    [facility] Follow up.   Why:  Home health PT arranged Contact information: [place] [address] [phone]        [facility] Follow up.   Why:  home oxygen arranged Contact information: [place] [address] [phone]          Allergies  Allergen Reactions  . Codeine Nausea And Vomiting    Hydrocodone is ok  . Lactose Intolerance (Gi) Diarrhea  . Lodine [Etodolac] Other (See  Comments)    dizziness  . Percocet [Oxycodone-Acetaminophen] Other (See Comments)    Percocet does not relieve pts' pain.   . Sulfa Drugs Cross Reactors Nausea Only  . Sulfamethoxazole Nausea And Vomiting    unknown    Consultations:  PCCM   Procedures/Studies: Dg Chest 2 View  Result Date: 10/08/2016 CLINICAL DATA:  Shortness of breath, wheezing EXAM: CHEST  2 VIEW COMPARISON:  10/06/2016 FINDINGS: Cardiomegaly again noted. Central vascular congestion without convincing pulmonary edema. Suboptimal exam due to patient's large body  habitus. Mild basilar atelectasis. No segmental infiltrate. Mild degenerative changes thoracic spine. IMPRESSION: Central vascular congestion without convincing pulmonary edema. Mild basilar atelectasis. Cardiomegaly. No segmental infiltrate. Electronically Signed   By: Lahoma Crocker M.D.   On: 10/08/2016 08:27   X-ray Chest Pa And Lateral  Result Date: 10/06/2016 CLINICAL DATA:  Acute respiratory failure.  Shortness of breath. EXAM: CHEST  2 VIEW COMPARISON:  Yesterday. FINDINGS: Interval decreased inspiration and interval enlargement of the cardiac silhouette. Mild increase in prominence of the pulmonary vasculature. Stable mild prominence of the interstitial markings. Thoracic spine degenerative changes. Aortic calcifications. IMPRESSION: 1. Interval mild cardiomegaly, accentuated by a decreased inspiration and the AP technique of the frontal view. 2. Stable mild chronic interstitial lung disease. Electronically Signed   By: Claudie Revering M.D.   On: 10/06/2016 10:26   Dg Chest 2 View  Result Date: 10/05/2016 CLINICAL DATA:  Cough and shortness of breath. EXAM: CHEST  2 VIEW COMPARISON:  August 14, 2015 FINDINGS: The heart size and mediastinal contours are within normal limits. Both lungs are clear. The visualized skeletal structures are unremarkable. IMPRESSION: No active cardiopulmonary disease. Electronically Signed   By: Dorise Bullion III M.D   On: 10/05/2016 13:06   Ct Angio Chest Pe W/cm &/or Wo Cm  Result Date: 10/05/2016 CLINICAL DATA:  Hypoxia and shortness of breath. EXAM: CT ANGIOGRAPHY CHEST WITH CONTRAST TECHNIQUE: Multidetector CT imaging of the chest was performed using the standard protocol during bolus administration of intravenous contrast. Multiplanar CT image reconstructions and MIPs were obtained to evaluate the vascular anatomy. CONTRAST:  75 mL Isovue 370 COMPARISON:  Chest radiograph 10/05/2016.  Chest CT 04/23/2009 FINDINGS: Cardiovascular: Negative for pulmonary embolism.  Coronary artery calcifications. Normal caliber of the thoracic aorta without dissection. Atherosclerotic calcifications in the aorta. There is a bovine arch. Bilateral common carotid arteries are very medial in the lower neck. Main branches of celiac trunk are patent. Mediastinum/Nodes: Small hiatal hernia. Few small mediastinal lymph nodes. Largest node is in the precarinal area measuring 1.1 in the short axis and this previously measured 1.0 cm. Overall, there is not significant chest lymphadenopathy. No significant pericardial fluid. No significant axillary lymphadenopathy. Mild heterogeneity of the thyroid tissue. Lungs/Pleura: Trachea and mainstem bronchi are patent. Subtle ground-glass densities in the left lower lobe on sequence 5, image 81. Otherwise, the lungs are clear without significant airspace disease or lung consolidation. No pleural fluid. Upper Abdomen: No acute abnormality. Musculoskeletal: Degenerative changes in the lower cervical spine. No acute bone abnormality. Review of the MIP images confirms the above findings. IMPRESSION: Negative for pulmonary embolism. No acute chest abnormality. Subtle ground-glass densities in the left lower lobe are nonspecific but could represent atelectasis or old disease. Small hiatal hernia. Electronically Signed   By: Markus Daft M.D.   On: 10/05/2016 14:48   US Renal  Result Date: 10/08/2016 CLINICAL DATA:  Acute kidney injury. History of hypertension, diabetes, and chronic kidney disease. EXAM: RENAL /  URINARY TRACT ULTRASOUND COMPLETE COMPARISON:  CT abdomen and pelvis 04/23/2009 FINDINGS: Right Kidney: Length: 11.1 cm. Mild-to-moderate cortical thinning. Echogenicity within normal limits. No mass or hydronephrosis visualized. Left Kidney: Length: 11.4 cm. Mild cortical thinning. Echogenicity within normal limits. No mass or hydronephrosis visualized. Bladder: Decompressed and not visualized. IMPRESSION: Bilateral renal cortical thinning.  No  hydronephrosis. Electronically Signed   By: Logan Bores M.D.   On: 10/08/2016 09:56   Dg Chest Port 1 View  Result Date: 10/09/2016 CLINICAL DATA:  Respiratory failure EXAM: PORTABLE CHEST 1 VIEW COMPARISON:  Yesterday FINDINGS: EKG leads create artifact over the chest. Stable mild cardiomegaly, accentuated by technique. Low volume chest with interstitial crowding. There is no edema, consolidation, effusion, or pneumothorax. No acute osseous finding. IMPRESSION: Stable from yesterday. Low volume chest without consolidation or edema. Electronically Signed   By: [place] Fantasia M.D.   On: 10/09/2016 07:08        Discharge Exam: Vitals:   10/15/16 0517 10/15/16 0814  BP: (!) 143/55 (!) 133/49  Pulse: (!) 109 (!) 58  Resp: 17   Temp: 98 F (36.7 C)    Vitals:   10/14/16 2133 10/14/16 2135 10/15/16 0517 10/15/16 0814  BP: (!) 140/116 (!) 137/56 (!) 143/55 (!) 133/49  Pulse: 68 65 (!) 109 (!) 58  Resp: 18  17   Temp: 97.9 F (36.6 C)  98 F (36.7 C)   TempSrc: Oral  Oral   SpO2: 95% 97% 91%   Weight:   101.7 kg (224 lb 4.8 oz)   Height:        General: Pt is alert, awake, not in acute distress Cardiovascular: RRR, S1/S2 +, no rubs, no gallops Respiratory:mild bibasilar crackles, no wheeze Abdominal: Soft, NT, ND, bowel sounds + Extremities: 1 + LE edema, no cyanosis   The results of significant diagnostics from this hospitalization (including imaging, microbiology, ancillary and laboratory) are listed below for reference.    Significant Diagnostic Studies: Dg Chest 2 View  Result Date: 10/08/2016 CLINICAL DATA:  Shortness of breath, wheezing EXAM: CHEST  2 VIEW COMPARISON:  10/06/2016 FINDINGS: Cardiomegaly again noted. Central vascular congestion without convincing pulmonary edema. Suboptimal exam due to patient's large body habitus. Mild basilar atelectasis. No segmental infiltrate. Mild degenerative changes thoracic spine. IMPRESSION: Central vascular congestion without  convincing pulmonary edema. Mild basilar atelectasis. Cardiomegaly. No segmental infiltrate. Electronically Signed   By: Lahoma Crocker M.D.   On: 10/08/2016 08:27   X-ray Chest Pa And Lateral  Result Date: 10/06/2016 CLINICAL DATA:  Acute respiratory failure.  Shortness of breath. EXAM: CHEST  2 VIEW COMPARISON:  Yesterday. FINDINGS: Interval decreased inspiration and interval enlargement of the cardiac silhouette. Mild increase in prominence of the pulmonary vasculature. Stable mild prominence of the interstitial markings. Thoracic spine degenerative changes. Aortic calcifications. IMPRESSION: 1. Interval mild cardiomegaly, accentuated by a decreased inspiration and the AP technique of the frontal view. 2. Stable mild chronic interstitial lung disease. Electronically Signed   By: Claudie Revering M.D.   On: 10/06/2016 10:26   Dg Chest 2 View  Result Date: 10/05/2016 CLINICAL DATA:  Cough and shortness of breath. EXAM: CHEST  2 VIEW COMPARISON:  August 14, 2015 FINDINGS: The heart size and mediastinal contours are within normal limits. Both lungs are clear. The visualized skeletal structures are unremarkable. IMPRESSION: No active cardiopulmonary disease. Electronically Signed   By: Dorise Bullion III M.D   On: 10/05/2016 13:06   Ct Angio Chest Pe W/cm &/or Wo Cm  Result Date: 10/05/2016 CLINICAL DATA:  Hypoxia and shortness of breath. EXAM: CT ANGIOGRAPHY CHEST WITH CONTRAST TECHNIQUE: Multidetector CT imaging of the chest was performed using the standard protocol during bolus administration of intravenous contrast. Multiplanar CT image reconstructions and MIPs were obtained to evaluate the vascular anatomy. CONTRAST:  75 mL Isovue 370 COMPARISON:  Chest radiograph 10/05/2016.  Chest CT 04/23/2009 FINDINGS: Cardiovascular: Negative for pulmonary embolism. Coronary artery calcifications. Normal caliber of the thoracic aorta without dissection. Atherosclerotic calcifications in the aorta. There is a bovine  arch. Bilateral common carotid arteries are very medial in the lower neck. Main branches of celiac trunk are patent. Mediastinum/Nodes: Small hiatal hernia. Few small mediastinal lymph nodes. Largest node is in the precarinal area measuring 1.1 in the short axis and this previously measured 1.0 cm. Overall, there is not significant chest lymphadenopathy. No significant pericardial fluid. No significant axillary lymphadenopathy. Mild heterogeneity of the thyroid tissue. Lungs/Pleura: Trachea and mainstem bronchi are patent. Subtle ground-glass densities in the left lower lobe on sequence 5, image 81. Otherwise, the lungs are clear without significant airspace disease or lung consolidation. No pleural fluid. Upper Abdomen: No acute abnormality. Musculoskeletal: Degenerative changes in the lower cervical spine. No acute bone abnormality. Review of the MIP images confirms the above findings. IMPRESSION: Negative for pulmonary embolism. No acute chest abnormality. Subtle ground-glass densities in the left lower lobe are nonspecific but could represent atelectasis or old disease. Small hiatal hernia. Electronically Signed   By: Markus Daft M.D.   On: 10/05/2016 14:48   US Renal  Result Date: 10/08/2016 CLINICAL DATA:  Acute kidney injury. History of hypertension, diabetes, and chronic kidney disease. EXAM: RENAL / URINARY TRACT ULTRASOUND COMPLETE COMPARISON:  CT abdomen and pelvis 04/23/2009 FINDINGS: Right Kidney: Length: 11.1 cm. Mild-to-moderate cortical thinning. Echogenicity within normal limits. No mass or hydronephrosis visualized. Left Kidney: Length: 11.4 cm. Mild cortical thinning. Echogenicity within normal limits. No mass or hydronephrosis visualized. Bladder: Decompressed and not visualized. IMPRESSION: Bilateral renal cortical thinning.  No hydronephrosis. Electronically Signed   By: Logan Bores M.D.   On: 10/08/2016 09:56   Dg Chest Port 1 View  Result Date: 10/09/2016 CLINICAL DATA:  Respiratory  failure EXAM: PORTABLE CHEST 1 VIEW COMPARISON:  Yesterday FINDINGS: EKG leads create artifact over the chest. Stable mild cardiomegaly, accentuated by technique. Low volume chest with interstitial crowding. There is no edema, consolidation, effusion, or pneumothorax. No acute osseous finding. IMPRESSION: Stable from yesterday. Low volume chest without consolidation or edema. Electronically Signed   By: [place] Fantasia M.D.   On: 10/09/2016 07:08     Microbiology: Recent Results (from the past 240 hour(s))  Urine culture     Status: Abnormal   Collection Time: 10/06/16 11:22 PM  Result Value Ref Range Status   Specimen Description URINE, RANDOM  Final   Special Requests NONE  Final   Culture 20,000 COLONIES/mL ESCHERICHIA COLI (A)  Final   Report Status 10/09/2016 FINAL  Final   Organism ID, Bacteria ESCHERICHIA COLI (A)  Final      Susceptibility   Escherichia coli - MIC*    AMPICILLIN >=32 RESISTANT Resistant     CEFAZOLIN >=64 RESISTANT Resistant     CEFTRIAXONE <=1 SENSITIVE Sensitive     CIPROFLOXACIN >=4 RESISTANT Resistant     GENTAMICIN <=1 SENSITIVE Sensitive     IMIPENEM <=0.25 SENSITIVE Sensitive     NITROFURANTOIN <=16 SENSITIVE Sensitive     TRIMETH/SULFA <=20 SENSITIVE Sensitive     AMPICILLIN/SULBACTAM >=  32 RESISTANT Resistant     PIP/TAZO 64 INTERMEDIATE Intermediate     Extended ESBL NEGATIVE Sensitive     * 20,000 COLONIES/mL ESCHERICHIA COLI  Culture, sputum-assessment     Status: None   Collection Time: 10/07/16  5:09 PM  Result Value Ref Range Status   Specimen Description EXPECTORATED SPUTUM  Final   Special Requests NONE  Final   Sputum evaluation   Final    THIS SPECIMEN IS ACCEPTABLE. RESPIRATORY CULTURE REPORT TO FOLLOW.   Report Status 10/07/2016 FINAL  Final  Culture, respiratory (NON-Expectorated)     Status: None   Collection Time: 10/07/16  5:09 PM  Result Value Ref Range Status   Specimen Description SPUTUM  Final   Special Requests NONE  Final     Gram Stain   Final    ABUNDANT WBC PRESENT, PREDOMINANTLY PMN ABUNDANT GRAM POSITIVE COCCI IN PAIRS ABUNDANT GRAM NEGATIVE RODS FEW GRAM NEGATIVE COCCI IN PAIRS FEW GRAM POSITIVE RODS    Culture Consistent with normal respiratory flora.  Final   Report Status 10/10/2016 FINAL  Final  MRSA PCR Screening     Status: None   Collection Time: 10/08/16 11:59 AM  Result Value Ref Range Status   MRSA by PCR NEGATIVE NEGATIVE Final    Comment:        The GeneXpert MRSA Assay (FDA approved for NASAL specimens only), is one component of a comprehensive MRSA colonization surveillance program. It is not intended to diagnose MRSA infection nor to guide or monitor treatment for MRSA infections.      Labs: Basic Metabolic Panel:  Recent Labs Lab 10/09/16 0206  10/10/16 0242 10/12/16 0546 10/13/16 1303 10/14/16 0702 10/15/16 0601  NA 135  --  138 142 136 138 137  K 4.6  4.7  < > 4.4 5.2* 4.5 4.4 4.3  CL 97*  --  97* 97* 94* 95* 98*  CO2 28  --  29 33* 33* 32 26  GLUCOSE 167*  --  255* 287* 362* 258* 241*  BUN 88*  --  98* 80* 68* 64* 67*  CREATININE 2.53*  --  2.00* 1.55* 1.76* 1.65* 1.54*  CALCIUM 8.1*  --  8.4* 9.0 9.0 9.2 8.7*  MG 2.6*  --  2.8*  --   --   --   --   PHOS 6.6*  --  4.3  --   --   --   --   < > = values in this interval not displayed. Liver Function Tests:  Recent Labs Lab 10/09/16 0206  ALBUMIN 3.1*   No results for input(s): LIPASE, AMYLASE in the last 168 hours. No results for input(s): AMMONIA in the last 168 hours. CBC:  Recent Labs Lab 10/09/16 0206 10/10/16 0242  WBC 10.7* 10.1  HGB 11.0* 12.0  HCT 36.8 38.0  MCV 90.4 87.0  PLT 170 218   Cardiac Enzymes: No results for input(s): CKTOTAL, CKMB, CKMBINDEX, TROPONINI in the last 168 hours. BNP: Invalid input(s): POCBNP CBG:  Recent Labs Lab 10/14/16 1133 10/14/16 1706 10/14/16 2131 10/15/16 0749 10/15/16 1140  GLUCAP 322* 378* 503* 167* 158*    Time coordinating discharge:   Greater than 30 minutes  Signed:  Gladies Sofranko, DO [facility] Pager: [phone] 10/15/2016, 1:01 PM

## 2016-10-14 NOTE — Progress Notes (Signed)
Inpatient Diabetes Program Recommendations  AACE/ADA: New Consensus Statement on Inpatient Glycemic Control (2015)  Target Ranges:  Prepandial:   less than 140 mg/dL      Peak postprandial:   less than 180 mg/dL (1-2 hours)      Critically ill patients:  140 - 180 mg/dL   Lab Results  Component Value Date   GLUCAP 322 (H) 10/14/2016   HGBA1C 6.1 (H) 10/05/2016    Review of Glycemic Control:  Results for RENAE, PUYEAR (MRN [id number]) as of 10/14/2016 12:59  Ref. Range 10/13/2016 12:35 10/13/2016 17:26 10/13/2016 22:10 10/14/2016 08:01 10/14/2016 11:33  Glucose-Capillary Latest Ref Range: 65 - 99 mg/dL 334 (H) 427 (H) 364 (H) 250 (H) 322 (H)    Consider increasing Novolog meal coverage to 8 units tid with meals while patient is on steroids.   Thanks, Adah Perl, RN, BC-ADM Inpatient Diabetes Coordinator Pager [phone] (8a-5p)

## 2016-10-14 NOTE — Progress Notes (Signed)
PROGRESS NOTE  STEPHANY [name] [id number] DOB: Jul 03, 1931 DOA: 10/05/2016 PCP: Laurie Patricia, MD  Brief History: 82 y.o.femalewith a history of the systolicheart failure, last EF 60% in October 2013, diabetes, hypertension, CAD, presenting to the ED withacute encephalopathy with multiple complaints including cough chest congestion,shortness of breath and nasal congestion x 5 days. The patient was noted to be hypoxic and started on doxycycline for acute respiratory failure due to presumed COPD exacerbation, possibly incited by URI. CTA of the chestwas performed and negative for pulmonary embolus. The patient was started on nebulizer treatments in addition to Solu-Medrol and doxycycline. Due to GI upset, doxycycline was discontinued, and the patient was started on azithromycin and ceftriaxone. It revealed a left lower lobe groundglass opacity. Shortly after admission, the patient developed elevatedwith serum creatinine that peaked at 2.53. Nephrology was consulted to assist with management. Renal ultrasound revealed bilateral cortical thinning without hydronephrosis. On 10/08/2016, the patient's respiratory status declined. ABG revealed pH 7.112/pCO2 99/ pO2 76.9. Critical care medicine was consulted, and the patient was transferred to ICU.They feel this is likely a combination of OSA/pulm edema/bronchospasm and added solumedrol and BDsChest x-ray revealed pulmonary vascular congestion, the patient was started on furosemide. Cardiology was consulted at the request of the patient's family. They did not feel pt needed any further cardiac workup.  Assessment/Plan: Acute respiratory failure with hypoxia and hypercarbia  -Multifactorial secondary to OSA, CHF, bronchospasm (VCD component)and probable underlying COPD with exacerbation  -Presently stable on 2 L nasal cannula -Wean oxygen for saturation greater than 92% -The patient is a chronic CO2 retainer  Acute on chronic  diastolic CHF  -[id number] echo EF 55-60%, grade 2 DD, PASP 47  -Admission weight 221 pounds -appears more euvolemic -Continue intravenous furosemide 80 mg IV bid-->po lasix on 12/11 -I's and O's are not accurate -Revealed medical record shows to outpatient weight approximately 223-224 pounds -Admission221 pounds, initially gained 7 pounds with fluid resuscitation -BMP in am -Continue metoprolol tartrate  COPD exacerbation with acute bronchitis -Continue duo nebs -continue IVsteroids to 60mg  IV q 8 hours--transition to po prednisone -Finished ceftriaxone and azithromycin 10/11/2016  Acute on chronic renal failure--CKD 3 -Secondary to contrast nephropathy in the setting of ARB induced ATN  -Baseline creatinine 1.1-1.4  -Serum creatinine improving  -Nephrology has signed off  -do not plan to restart ARB at time of d/c due to CKD and risk for recurrent AKI  Acute metabolic encephalopathy -Secondary to hypercarbia -Improved  Atypical chest pain with known history of CAD -Appreciate cardiology consult-->no further workup -Continue aspirin  Essential hypertension -Continue metoprolol tartrate and amlodipine  Diabetes mellitus -10/05/2016 hemoglobin A1c 6.1 -Elevated CBGs secondary to steroid use -IncreasedLantus 38 units--plan to decrease as steroid doses decrease -NovoLog sliding scale--increase to resistant scale -novolog 5 units with meals  Bacteriuria -Unclear clinical significance -No pyuria on urinalysis   Disposition Plan: Home 12/12 if stable Family Communication: No Family at bedside  Consultants: PCCM, cardiology  Code Status: FULL  DVT Prophylaxis: [place] Heparin   Procedures: As Listed in Progress Note Above  Antibiotics: Doxy 12/3>>12/4 Ceftriaxone 12/4>>>12/8 Azithromycin 12/4>>>12/8    Subjective: Patient denies fevers, chills, headache, chest pain, dyspnea, nausea, vomiting, diarrhea, abdominal pain, dysuria,  hematuria, hematochezia, and melena.  She is breathing better   Objective: Vitals:   10/14/16 0500 10/14/16 0607 10/14/16 0830 10/14/16 0853  BP:  (!) 145/62 (!) 141/62   Pulse:  (!) 58 (!) 59   Resp:  18    Temp:  98 F (36.7 C)    TempSrc:  Oral    SpO2:  95%  97%  Weight: 99.9 kg (220 lb 3.2 oz)     Height:       No intake or output data in the 24 hours ending 10/14/16 0953 Weight change: -0.518 kg (-1 lb 2.3 oz) Exam:   General:  Pt is alert, follows commands appropriately, not in acute distress  HEENT: No icterus, No thrush, No neck mass, /AT  Cardiovascular: RRR, S1/S2, no rubs, no gallops  Respiratory: bibasilar crackles, minimal bibasilar wheeze  Abdomen: Soft/+BS, non tender, non distended, no guarding  Extremities: 1 + LE edema, No lymphangitis, No petechiae, No rashes, no synovitis   Data Reviewed: I have personally reviewed following labs and imaging studies Basic Metabolic Panel:  Recent Labs Lab 10/09/16 0206 10/09/16 0925 10/10/16 0242 10/12/16 0546 10/13/16 1303 10/14/16 0702  NA 135  --  138 142 136 138  K 4.6  4.7 4.7 4.4 5.2* 4.5 4.4  CL 97*  --  97* 97* 94* 95*  CO2 28  --  29 33* 33* 32  GLUCOSE 167*  --  255* 287* 362* 258*  BUN 88*  --  98* 80* 68* 64*  CREATININE 2.53*  --  2.00* 1.55* 1.76* 1.65*  CALCIUM 8.1*  --  8.4* 9.0 9.0 9.2  MG 2.6*  --  2.8*  --   --   --   PHOS 6.6*  --  4.3  --   --   --    Liver Function Tests:  Recent Labs Lab 10/09/16 0206  ALBUMIN 3.1*   No results for input(s): LIPASE, AMYLASE in the last 168 hours. No results for input(s): AMMONIA in the last 168 hours. Coagulation Profile: No results for input(s): INR, PROTIME in the last 168 hours. CBC:  Recent Labs Lab 10/09/16 0206 10/10/16 0242  WBC 10.7* 10.1  HGB 11.0* 12.0  HCT 36.8 38.0  MCV 90.4 87.0  PLT 170 218   Cardiac Enzymes:  Recent Labs Lab 10/08/16 1055  CKTOTAL 210   BNP: Invalid input(s): POCBNP CBG:  Recent  Labs Lab 10/13/16 0809 10/13/16 1235 10/13/16 1726 10/13/16 2210 10/14/16 0801  GLUCAP 295* 334* 427* 364* 250*   HbA1C: No results for input(s): HGBA1C in the last 72 hours. Urine analysis:    Component Value Date/Time   COLORURINE YELLOW 10/06/2016 2321   APPEARANCEUR CLOUDY (A) 10/06/2016 2321   LABSPEC 1.022 10/06/2016 2321   PHURINE 5.0 10/06/2016 2321   GLUCOSEU NEGATIVE 10/06/2016 2321   HGBUR NEGATIVE 10/06/2016 2321   BILIRUBINUR NEGATIVE 10/06/2016 2321   KETONESUR NEGATIVE 10/06/2016 2321   PROTEINUR NEGATIVE 10/06/2016 2321   UROBILINOGEN 0.2 03/10/2012 0800   NITRITE NEGATIVE 10/06/2016 2321   LEUKOCYTESUR NEGATIVE 10/06/2016 2321   Sepsis Labs: @LABRCNTIP (procalcitonin:4,lacticidven:4) ) Recent Results (from the past 240 hour(s))  Urine culture     Status: Abnormal   Collection Time: 10/06/16 11:22 PM  Result Value Ref Range Status   Specimen Description URINE, RANDOM  Final   Special Requests NONE  Final   Culture 20,000 COLONIES/mL ESCHERICHIA COLI (A)  Final   Report Status 10/09/2016 FINAL  Final   Organism ID, Bacteria ESCHERICHIA COLI (A)  Final      Susceptibility   Escherichia coli - MIC*    AMPICILLIN >=32 RESISTANT Resistant     CEFAZOLIN >=64 RESISTANT Resistant     CEFTRIAXONE <=1 SENSITIVE Sensitive  CIPROFLOXACIN >=4 RESISTANT Resistant     GENTAMICIN <=1 SENSITIVE Sensitive     IMIPENEM <=0.25 SENSITIVE Sensitive     NITROFURANTOIN <=16 SENSITIVE Sensitive     TRIMETH/SULFA <=20 SENSITIVE Sensitive     AMPICILLIN/SULBACTAM >=32 RESISTANT Resistant     PIP/TAZO 64 INTERMEDIATE Intermediate     Extended ESBL NEGATIVE Sensitive     * 20,000 COLONIES/mL ESCHERICHIA COLI  Culture, sputum-assessment     Status: None   Collection Time: 10/07/16  5:09 PM  Result Value Ref Range Status   Specimen Description EXPECTORATED SPUTUM  Final   Special Requests NONE  Final   Sputum evaluation   Final    THIS SPECIMEN IS ACCEPTABLE. RESPIRATORY  CULTURE REPORT TO FOLLOW.   Report Status 10/07/2016 FINAL  Final  Culture, respiratory (NON-Expectorated)     Status: None   Collection Time: 10/07/16  5:09 PM  Result Value Ref Range Status   Specimen Description SPUTUM  Final   Special Requests NONE  Final   Gram Stain   Final    ABUNDANT WBC PRESENT, PREDOMINANTLY PMN ABUNDANT GRAM POSITIVE COCCI IN PAIRS ABUNDANT GRAM NEGATIVE RODS FEW GRAM NEGATIVE COCCI IN PAIRS FEW GRAM POSITIVE RODS    Culture Consistent with normal respiratory flora.  Final   Report Status 10/10/2016 FINAL  Final  MRSA PCR Screening     Status: None   Collection Time: 10/08/16 11:59 AM  Result Value Ref Range Status   MRSA by PCR NEGATIVE NEGATIVE Final    Comment:        The GeneXpert MRSA Assay (FDA approved for NASAL specimens only), is one component of a comprehensive MRSA colonization surveillance program. It is not intended to diagnose MRSA infection nor to guide or monitor treatment for MRSA infections.      Scheduled Meds: . amLODipine  10 mg Oral Daily  . aspirin EC  81 mg Oral BH-q7a  . atorvastatin  40 mg Oral QPM  . fluticasone  2 spray Each Nare Daily  . furosemide  80 mg Oral Daily  . gabapentin  600 mg Oral QHS  . heparin  5,000 Units Subcutaneous Q8H  . insulin aspart  0-20 Units Subcutaneous TID WC  . insulin aspart  0-5 Units Subcutaneous QHS  . insulin aspart  5 Units Subcutaneous TID WC  . insulin glargine  38 Units Subcutaneous QHS  . ipratropium-albuterol  3 mL Nebulization TID  . methylPREDNISolone (SOLU-MEDROL) injection  60 mg Intravenous Q8H  . metoprolol tartrate  25 mg Oral BID  . [START ON 10/15/2016] predniSONE  60 mg Oral Q breakfast   Continuous Infusions:  Procedures/Studies: Dg Chest 2 View  Result Date: 10/08/2016 CLINICAL DATA:  Shortness of breath, wheezing EXAM: CHEST  2 VIEW COMPARISON:  10/06/2016 FINDINGS: Cardiomegaly again noted. Central vascular congestion without convincing pulmonary edema.  Suboptimal exam due to patient's large body habitus. Mild basilar atelectasis. No segmental infiltrate. Mild degenerative changes thoracic spine. IMPRESSION: Central vascular congestion without convincing pulmonary edema. Mild basilar atelectasis. Cardiomegaly. No segmental infiltrate. Electronically Signed   By: Lahoma Crocker M.D.   On: 10/08/2016 08:27   X-ray Chest Pa And Lateral  Result Date: 10/06/2016 CLINICAL DATA:  Acute respiratory failure.  Shortness of breath. EXAM: CHEST  2 VIEW COMPARISON:  Yesterday. FINDINGS: Interval decreased inspiration and interval enlargement of the cardiac silhouette. Mild increase in prominence of the pulmonary vasculature. Stable mild prominence of the interstitial markings. Thoracic spine degenerative changes. Aortic calcifications. IMPRESSION: 1. Interval  mild cardiomegaly, accentuated by a decreased inspiration and the AP technique of the frontal view. 2. Stable mild chronic interstitial lung disease. Electronically Signed   By: Claudie Revering M.D.   On: 10/06/2016 10:26   Dg Chest 2 View  Result Date: 10/05/2016 CLINICAL DATA:  Cough and shortness of breath. EXAM: CHEST  2 VIEW COMPARISON:  August 14, 2015 FINDINGS: The heart size and mediastinal contours are within normal limits. Both lungs are clear. The visualized skeletal structures are unremarkable. IMPRESSION: No active cardiopulmonary disease. Electronically Signed   By: Dorise Bullion III M.D   On: 10/05/2016 13:06   Ct Angio Chest Pe W/cm &/or Wo Cm  Result Date: 10/05/2016 CLINICAL DATA:  Hypoxia and shortness of breath. EXAM: CT ANGIOGRAPHY CHEST WITH CONTRAST TECHNIQUE: Multidetector CT imaging of the chest was performed using the standard protocol during bolus administration of intravenous contrast. Multiplanar CT image reconstructions and MIPs were obtained to evaluate the vascular anatomy. CONTRAST:  75 mL Isovue 370 COMPARISON:  Chest radiograph 10/05/2016.  Chest CT 04/23/2009 FINDINGS:  Cardiovascular: Negative for pulmonary embolism. Coronary artery calcifications. Normal caliber of the thoracic aorta without dissection. Atherosclerotic calcifications in the aorta. There is a bovine arch. Bilateral common carotid arteries are very medial in the lower neck. Main branches of celiac trunk are patent. Mediastinum/Nodes: Small hiatal hernia. Few small mediastinal lymph nodes. Largest node is in the precarinal area measuring 1.1 in the short axis and this previously measured 1.0 cm. Overall, there is not significant chest lymphadenopathy. No significant pericardial fluid. No significant axillary lymphadenopathy. Mild heterogeneity of the thyroid tissue. Lungs/Pleura: Trachea and mainstem bronchi are patent. Subtle ground-glass densities in the left lower lobe on sequence 5, image 81. Otherwise, the lungs are clear without significant airspace disease or lung consolidation. No pleural fluid. Upper Abdomen: No acute abnormality. Musculoskeletal: Degenerative changes in the lower cervical spine. No acute bone abnormality. Review of the MIP images confirms the above findings. IMPRESSION: Negative for pulmonary embolism. No acute chest abnormality. Subtle ground-glass densities in the left lower lobe are nonspecific but could represent atelectasis or old disease. Small hiatal hernia. Electronically Signed   By: Markus Daft M.D.   On: 10/05/2016 14:48   US Renal  Result Date: 10/08/2016 CLINICAL DATA:  Acute kidney injury. History of hypertension, diabetes, and chronic kidney disease. EXAM: RENAL / URINARY TRACT ULTRASOUND COMPLETE COMPARISON:  CT abdomen and pelvis 04/23/2009 FINDINGS: Right Kidney: Length: 11.1 cm. Mild-to-moderate cortical thinning. Echogenicity within normal limits. No mass or hydronephrosis visualized. Left Kidney: Length: 11.4 cm. Mild cortical thinning. Echogenicity within normal limits. No mass or hydronephrosis visualized. Bladder: Decompressed and not visualized. IMPRESSION:  Bilateral renal cortical thinning.  No hydronephrosis. Electronically Signed   By: Logan Bores M.D.   On: 10/08/2016 09:56   Dg Chest Port 1 View  Result Date: 10/09/2016 CLINICAL DATA:  Respiratory failure EXAM: PORTABLE CHEST 1 VIEW COMPARISON:  Yesterday FINDINGS: EKG leads create artifact over the chest. Stable mild cardiomegaly, accentuated by technique. Low volume chest with interstitial crowding. There is no edema, consolidation, effusion, or pneumothorax. No acute osseous finding. IMPRESSION: Stable from yesterday. Low volume chest without consolidation or edema. Electronically Signed   By: Monte Fantasia M.D.   On: 10/09/2016 07:08    Vetta Couzens, DO  [facility] Pager [phone]  If 7PM-7AM, please contact night-coverage [email or web address] Password [email or web address] 10/14/2016, 9:53 AM   LOS: 8 days

## 2016-10-14 NOTE — Progress Notes (Addendum)
SATURATION QUALIFICATIONS: (This note is used to comply with regulatory documentation for home oxygen)  Patient Saturations on Room Air at Rest = 91%  Patient Saturations on Room Air while Ambulating = 86%  Patient Saturations on 2 Liters of oxygen while Ambulating = 93%-97%  Please briefly explain why patient needs home oxygen:  Patient desaturated with activity.  Will require O2 to reduce repeated hypoxic events.    Governor Rooks, PTA pager [phone]

## 2016-10-15 ENCOUNTER — Other Ambulatory Visit: Payer: Self-pay | Admitting: Cardiovascular Disease

## 2016-10-15 LAB — BASIC METABOLIC PANEL
Anion gap: 13 (ref 5–15)
BUN: 67 mg/dL — ABNORMAL HIGH (ref 6–20)
CO2: 26 mmol/L (ref 22–32)
Calcium: 8.7 mg/dL — ABNORMAL LOW (ref 8.9–10.3)
Chloride: 98 mmol/L — ABNORMAL LOW (ref 101–111)
Creatinine, Ser: 1.54 mg/dL — ABNORMAL HIGH (ref 0.44–1.00)
GFR calc Af Amer: 35 mL/min — ABNORMAL LOW (ref >60)
GFR calc non Af Amer: 30 mL/min — ABNORMAL LOW (ref >60)
Glucose, Bld: 241 mg/dL — ABNORMAL HIGH (ref 65–99)
Potassium: 4.3 mmol/L (ref 3.5–5.1)
Sodium: 137 mmol/L (ref 135–145)

## 2016-10-15 LAB — GLUCOSE, CAPILLARY
Glucose-Capillary: 167 mg/dL — ABNORMAL HIGH (ref 65–99)
Glucose-Capillary: 158 mg/dL — ABNORMAL HIGH (ref 65–99)

## 2016-10-15 MED ORDER — PREDNISONE 10 MG PO TABS
60.0000 mg | ORAL_TABLET | Freq: Every day | ORAL | 0 refills | Status: DC
Start: 2016-10-16 — End: 2016-11-27

## 2016-10-15 MED ORDER — PREDNISONE 20 MG PO TABS: 60.0000 mg | ORAL_TABLET | Freq: Every day | ORAL | 0 refills | Status: DC

## 2016-10-15 NOTE — Progress Notes (Signed)
[name] to be D/C'd to home with home health PT and oxygen per MD order.  Discussed with the patient and all questions fully answered.  VSS, Skin clean, dry and intact without evidence of skin break down, no evidence of skin tears noted. IV catheter discontinued intact. Site without signs and symptoms of complications. Dressing and pressure applied.  An After Visit Summary was printed and given to the patient. Patient received prescriptions and oxygen delivered to room.  D/c education completed with patient/family including follow up instructions, medication list, d/c activities limitations if indicated, with other d/c instructions as indicated by MD - patient able to verbalize understanding, all questions fully answered.   Patient instructed to return to ED, call 911, or call MD for any changes in condition.   Patient escorted via [place], and D/C home via private auto.  [name] 10/15/2016 2:11 PM

## 2016-10-15 NOTE — Care Management Important Message (Signed)
Important Message  Patient Details  Name: [name] MRN: [id number] Date of Birth: 1931-08-16   Medicare Important Message Given:  Yes    Nathen May 10/15/2016, 10:34 AM

## 2016-10-15 NOTE — Progress Notes (Signed)
Physical Therapy Treatment Patient Details Name: [name] MRN: [id number] DOB: December 09, 1930 Today's Date: 10/15/2016    History of Present Illness Pt is an 82 y.o. female with PMH consisting of chronic diastolic CHF, last EF [id number] in October 2013, HTN, CAD, anasarca, DM type II controlled with complication, HLD, CKD, spondylolisthesis, and lumbar radiculopathy. She was admitted with dx of acute respiratory failure with hypoxia.    PT Comments    Pt performing increased mobility including stair training to ensure safe entry into home.  Pt remains to require 2L to maintain O2 saturations.    Follow Up Recommendations  Home health PT;Supervision/Assistance - 24 hour     Equipment Recommendations  None recommended by PT    Recommendations for Other Services       Precautions / Restrictions Precautions Precautions: Fall;Other (comment) Precaution Comments: watch O2 sats Restrictions Weight Bearing Restrictions: No    Mobility  Bed Mobility               General bed mobility comments: Pt received in recliner.  Transfers Overall transfer level: Needs assistance Equipment used: Rolling walker (2 wheeled) Transfers: Sit to/from Stand Sit to Stand: Supervision Stand pivot transfers: Supervision       General transfer comment: Pt remains to require cues for hand placement and backing to seated surface to ensure safety and improve ease.    Ambulation/Gait Ambulation/Gait assistance: Supervision Ambulation Distance (Feet): 200 Feet (seated break then additonal 157ft.  ) Assistive device: Rolling walker (2 wheeled) Gait Pattern/deviations: Step-through pattern;Decreased stride length Gait velocity: decreased   General Gait Details: Pt 95%-98% on 2L.  Cues for R heel strike in stance phase.  No buckling observed.     Stairs Stairs: Yes   Stair Management: Two rails Number of Stairs: 3 General stair comments: Cues for sequencing and hand placement on railing.  Pt  educated to be mindful of where her O2 tubing line is.    Wheelchair Mobility    Modified Rankin (Stroke Patients Only)       Balance Overall balance assessment: Needs assistance   Sitting balance-Leahy Scale: Good       Standing balance-Leahy Scale: Fair                      Cognition Arousal/Alertness: Awake/alert Behavior During Therapy: WFL for tasks assessed/performed Overall Cognitive Status: Within Functional Limits for tasks assessed                      Exercises      General Comments        Pertinent Vitals/Pain Pain Assessment: No/denies pain    Home Living                      Prior Function            PT Goals (current goals can now be found in the care plan section) Acute Rehab PT Goals Patient Stated Goal: home Potential to Achieve Goals: Good Progress towards PT goals: Progressing toward goals    Frequency    Min 3X/week      PT Plan Current plan remains appropriate    Co-evaluation             End of Session Equipment Utilized During Treatment: Gait belt;Oxygen Activity Tolerance: Patient tolerated treatment well Patient left: in chair;with call bell/phone within reach     Time: 1035-1100 PT Time Calculation (min) (ACUTE ONLY): 25  min  Charges:  $Gait Training: 8-22 mins $Therapeutic Activity: 8-22 mins                    G Codes:      Cristela Blue 2016/10/25, 11:07 AM Governor Rooks, PTA pager [phone]

## 2016-10-15 NOTE — Consult Note (Signed)
[facility] CM Primary Care Navigator  10/15/2016  [name] 10-05-1931 [id number]  Met with patient and son [name]) at the bedside to identify possible discharge needs. Patient reports having chest congestion,shortness of breath and nasal congestion that led to this admission.  Patient endorses Dr. Jerilynn Mages. Althe[place] with [facility] as the primary provider.  She declined to have a PCP stating that her "insurance does not require her to have one" and "avoiding to have several doctors managing health" which makes it so complicated according to patient.   Patient shared using [facility] pharmacy in [place] to obtain medications without any problem.   Patient reports managing her own medications at home using "pill box" system.   Son reports that patient is able to drive prior to admission. Her two sons Abe People and [name]) will be able to provide transportation to her doctors' appointments if needed.  Husband (dementia) is physically able to assist with her care per son's report.   Patient is the primary caregiver for herself at home. Sons will assist if needed.  Discharge plan is home with home health services [facility]).  Patient and son voiced understanding to call primary care provider's office, when she gets home, for a post discharge follow-up appointment within a week or sooner if needed. Patient letter provided as a reminder.  Patient denies any care coordination needs and declined [facility] services or disease management/ education at this time. [facility] care management contact information provided for futureneeds that may arise.   For additional questions please contact:  Edwena Felty A. Jamil Armwood, BSN, RN-BC [facility] PRIMARY CARE Navigator Cell: [phone]

## 2016-10-16 DIAGNOSIS — Z794 Long term (current) use of insulin: Secondary | ICD-10-CM | POA: Diagnosis not present

## 2016-10-16 DIAGNOSIS — M5416 Radiculopathy, lumbar region: Secondary | ICD-10-CM | POA: Diagnosis not present

## 2016-10-16 DIAGNOSIS — Z9981 Dependence on supplemental oxygen: Secondary | ICD-10-CM | POA: Diagnosis not present

## 2016-10-16 DIAGNOSIS — I5033 Acute on chronic diastolic (congestive) heart failure: Secondary | ICD-10-CM | POA: Diagnosis not present

## 2016-10-16 DIAGNOSIS — I2510 Atherosclerotic heart disease of native coronary artery without angina pectoris: Secondary | ICD-10-CM | POA: Diagnosis not present

## 2016-10-16 DIAGNOSIS — Z7984 Long term (current) use of oral hypoglycemic drugs: Secondary | ICD-10-CM | POA: Diagnosis not present

## 2016-10-16 DIAGNOSIS — Z87440 Personal history of urinary (tract) infections: Secondary | ICD-10-CM | POA: Diagnosis not present

## 2016-10-16 DIAGNOSIS — E1140 Type 2 diabetes mellitus with diabetic neuropathy, unspecified: Secondary | ICD-10-CM | POA: Diagnosis not present

## 2016-10-16 DIAGNOSIS — J441 Chronic obstructive pulmonary disease with (acute) exacerbation: Secondary | ICD-10-CM | POA: Diagnosis not present

## 2016-10-16 DIAGNOSIS — Z92241 Personal history of systemic steroid therapy: Secondary | ICD-10-CM | POA: Diagnosis not present

## 2016-10-16 DIAGNOSIS — N183 Chronic kidney disease, stage 3 (moderate): Secondary | ICD-10-CM | POA: Diagnosis not present

## 2016-10-16 DIAGNOSIS — I130 Hypertensive heart and chronic kidney disease with heart failure and stage 1 through stage 4 chronic kidney disease, or unspecified chronic kidney disease: Secondary | ICD-10-CM | POA: Diagnosis not present

## 2016-10-16 DIAGNOSIS — E1122 Type 2 diabetes mellitus with diabetic chronic kidney disease: Secondary | ICD-10-CM | POA: Diagnosis not present

## 2016-10-17 DIAGNOSIS — I2510 Atherosclerotic heart disease of native coronary artery without angina pectoris: Secondary | ICD-10-CM | POA: Diagnosis not present

## 2016-10-17 DIAGNOSIS — I5033 Acute on chronic diastolic (congestive) heart failure: Secondary | ICD-10-CM | POA: Diagnosis not present

## 2016-10-17 DIAGNOSIS — N183 Chronic kidney disease, stage 3 (moderate): Secondary | ICD-10-CM | POA: Diagnosis not present

## 2016-10-17 DIAGNOSIS — I130 Hypertensive heart and chronic kidney disease with heart failure and stage 1 through stage 4 chronic kidney disease, or unspecified chronic kidney disease: Secondary | ICD-10-CM | POA: Diagnosis not present

## 2016-10-17 DIAGNOSIS — E1122 Type 2 diabetes mellitus with diabetic chronic kidney disease: Secondary | ICD-10-CM | POA: Diagnosis not present

## 2016-10-17 DIAGNOSIS — J441 Chronic obstructive pulmonary disease with (acute) exacerbation: Secondary | ICD-10-CM | POA: Diagnosis not present

## 2016-10-19 ENCOUNTER — Other Ambulatory Visit: Payer: Self-pay | Admitting: Cardiovascular Disease

## 2016-10-21 ENCOUNTER — Telehealth: Payer: Self-pay | Admitting: Cardiovascular Disease

## 2016-10-21 DIAGNOSIS — I5033 Acute on chronic diastolic (congestive) heart failure: Secondary | ICD-10-CM | POA: Diagnosis not present

## 2016-10-21 DIAGNOSIS — I2510 Atherosclerotic heart disease of native coronary artery without angina pectoris: Secondary | ICD-10-CM | POA: Diagnosis not present

## 2016-10-21 DIAGNOSIS — J441 Chronic obstructive pulmonary disease with (acute) exacerbation: Secondary | ICD-10-CM | POA: Diagnosis not present

## 2016-10-21 DIAGNOSIS — I130 Hypertensive heart and chronic kidney disease with heart failure and stage 1 through stage 4 chronic kidney disease, or unspecified chronic kidney disease: Secondary | ICD-10-CM | POA: Diagnosis not present

## 2016-10-21 DIAGNOSIS — E1122 Type 2 diabetes mellitus with diabetic chronic kidney disease: Secondary | ICD-10-CM | POA: Diagnosis not present

## 2016-10-21 DIAGNOSIS — N183 Chronic kidney disease, stage 3 (moderate): Secondary | ICD-10-CM | POA: Diagnosis not present

## 2016-10-21 NOTE — Telephone Encounter (Signed)
Spoke with patient who states she was recently discharged from the hospital and was wondering when she should follow-up with Dr. Acie Fredrickson.  She denies complaints and states she feels like her cardiac problems are stable.  I advised that her regular follow-up is due in April and advised we could see her after the first of the year for sooner follow-up.  I scheduled her to see Dr. Acie Fredrickson on 11/27/16 and advised her to call back if she has questions or concerns prior to that appointment.  She verbalized understanding and agreement and thanked me for the call.

## 2016-10-21 NOTE — Telephone Encounter (Signed)
[name] is calling because she was in the hospital and is now at home and is getting back to normal and is wanting to know when should she come in ? Please call   Thanks

## 2016-10-21 NOTE — Telephone Encounter (Signed)
Dr. Mare Ferrari approved refill in the past but Dr. Elyse Hsu is managing this patient's diabetes; please forward to him.

## 2016-10-21 NOTE — Telephone Encounter (Signed)
Okay to refill or should this be denied and deferred to patients pcp? I see the note on the last rx that all further refills need to come from pcp, but also per last office visit, The following changes have been made:  At her request we are going to increase her gabapentin up to 600 mg daily in the evening. Please advise. Thanks, MI

## 2016-10-23 DIAGNOSIS — I2510 Atherosclerotic heart disease of native coronary artery without angina pectoris: Secondary | ICD-10-CM | POA: Diagnosis not present

## 2016-10-23 DIAGNOSIS — N183 Chronic kidney disease, stage 3 (moderate): Secondary | ICD-10-CM | POA: Diagnosis not present

## 2016-10-23 DIAGNOSIS — I5033 Acute on chronic diastolic (congestive) heart failure: Secondary | ICD-10-CM | POA: Diagnosis not present

## 2016-10-23 DIAGNOSIS — J441 Chronic obstructive pulmonary disease with (acute) exacerbation: Secondary | ICD-10-CM | POA: Diagnosis not present

## 2016-10-23 DIAGNOSIS — E1122 Type 2 diabetes mellitus with diabetic chronic kidney disease: Secondary | ICD-10-CM | POA: Diagnosis not present

## 2016-10-23 DIAGNOSIS — I130 Hypertensive heart and chronic kidney disease with heart failure and stage 1 through stage 4 chronic kidney disease, or unspecified chronic kidney disease: Secondary | ICD-10-CM | POA: Diagnosis not present

## 2016-10-25 ENCOUNTER — Telehealth: Payer: Self-pay | Admitting: Cardiovascular Disease

## 2016-10-25 NOTE — Telephone Encounter (Signed)
Noted. Forwarding to Dr. Nahser for his awareness. 

## 2016-10-25 NOTE — Telephone Encounter (Signed)
Noted  

## 2016-10-25 NOTE — Telephone Encounter (Signed)
[name] from [place] [facility] was calling just to let Dr.Nasher know his patient was apart of her program. If they are any questions please call her.

## 2016-10-28 ENCOUNTER — Encounter (HOSPITAL_COMMUNITY): Payer: Self-pay

## 2016-10-28 ENCOUNTER — Emergency Department (HOSPITAL_BASED_OUTPATIENT_CLINIC_OR_DEPARTMENT_OTHER)
Admit: 2016-10-28 | Discharge: 2016-10-28 | Disposition: A | Discharge status: Home / Self Care | Payer: Medicare Other | Attending: Emergency Medicine | Admitting: Emergency Medicine

## 2016-10-28 ENCOUNTER — Emergency Department (HOSPITAL_COMMUNITY)
Admission: EM | Admit: 2016-10-28 | Discharge: 2016-10-28 | Disposition: A | Discharge status: Home / Self Care | Payer: Medicare Other | Attending: Emergency Medicine | Admitting: Emergency Medicine

## 2016-10-28 DIAGNOSIS — R600 Localized edema: Secondary | ICD-10-CM | POA: Insufficient documentation

## 2016-10-28 DIAGNOSIS — I130 Hypertensive heart and chronic kidney disease with heart failure and stage 1 through stage 4 chronic kidney disease, or unspecified chronic kidney disease: Secondary | ICD-10-CM | POA: Diagnosis not present

## 2016-10-28 DIAGNOSIS — Z7982 Long term (current) use of aspirin: Secondary | ICD-10-CM | POA: Diagnosis not present

## 2016-10-28 DIAGNOSIS — Z96642 Presence of left artificial hip joint: Secondary | ICD-10-CM | POA: Diagnosis not present

## 2016-10-28 DIAGNOSIS — M79604 Pain in right leg: Secondary | ICD-10-CM | POA: Diagnosis present

## 2016-10-28 DIAGNOSIS — Z794 Long term (current) use of insulin: Secondary | ICD-10-CM | POA: Diagnosis not present

## 2016-10-28 DIAGNOSIS — E1122 Type 2 diabetes mellitus with diabetic chronic kidney disease: Secondary | ICD-10-CM | POA: Diagnosis not present

## 2016-10-28 DIAGNOSIS — N183 Chronic kidney disease, stage 3 (moderate): Secondary | ICD-10-CM | POA: Diagnosis not present

## 2016-10-28 DIAGNOSIS — M79609 Pain in unspecified limb: Secondary | ICD-10-CM | POA: Diagnosis not present

## 2016-10-28 DIAGNOSIS — M7989 Other specified soft tissue disorders: Secondary | ICD-10-CM

## 2016-10-28 DIAGNOSIS — I2510 Atherosclerotic heart disease of native coronary artery without angina pectoris: Secondary | ICD-10-CM | POA: Diagnosis not present

## 2016-10-28 DIAGNOSIS — Z79899 Other long term (current) drug therapy: Secondary | ICD-10-CM | POA: Insufficient documentation

## 2016-10-28 DIAGNOSIS — I5033 Acute on chronic diastolic (congestive) heart failure: Secondary | ICD-10-CM | POA: Diagnosis not present

## 2016-10-28 LAB — I-STAT CHEM 8, ED
BUN: 23 mg/dL — ABNORMAL HIGH (ref 6–20)
Calcium, Ion: 1.19 mmol/L (ref 1.15–1.40)
Chloride: 101 mmol/L (ref 101–111)
Creatinine, Ser: 1.4 mg/dL — ABNORMAL HIGH (ref 0.44–1.00)
Glucose, Bld: 143 mg/dL — ABNORMAL HIGH (ref 65–99)
HCT: 34 % — ABNORMAL LOW (ref 36.0–46.0)
Hemoglobin: 11.6 g/dL — ABNORMAL LOW (ref 12.0–15.0)
Potassium: 3.9 mmol/L (ref 3.5–5.1)
Sodium: 143 mmol/L (ref 135–145)
TCO2: 31 mmol/L (ref 0–100)

## 2016-10-28 MED ORDER — HYDROCODONE-ACETAMINOPHEN 5-325 MG PO TABS
1.0000 | ORAL_TABLET | Freq: Once | ORAL | Status: AC
  Administered 2016-10-28: 1 via ORAL
  Filled 2016-10-28: qty 1

## 2016-10-28 MED ORDER — HYDROCODONE-ACETAMINOPHEN 5-325 MG PO TABS: 1.0000 | ORAL_TABLET | ORAL | 0 refills | Status: DC | PRN

## 2016-10-28 NOTE — ED Triage Notes (Signed)
Per Pt, Pt is coming from home with complaints of right leg pain that started on Friday. Reports swelling chronically and unaware of it is different from normal. Pt called PCP and was told to come here.

## 2016-10-28 NOTE — ED Notes (Signed)
Phlebotomy will attempt lab draw

## 2016-10-28 NOTE — Discharge Instructions (Addendum)
° °  If you were given medicines take as directed.  If you are on coumadin or contraceptives realize their levels and effectiveness is altered by many different medicines.  If you have any reaction (rash, tongues swelling, other) to the medicines stop taking and see a physician.    If your blood pressure was elevated in the ER make sure you follow up for management with a primary doctor or return for chest pain, shortness of breath or stroke symptoms.  Please follow up as directed and return to the ER or see a physician for new or worsening symptoms.  Thank you. Vitals:   10/28/16 1519  BP: 148/71  Pulse: 68  Resp: 20  Temp: 98.2 F (36.8 C)  TempSrc: Oral  SpO2: 99%  Weight: 217 lb (98.4 kg)  Height: 5\' 4"  (1.626 m)

## 2016-10-28 NOTE — ED Notes (Signed)
Attempted lab draw, unsuccessful.

## 2016-10-28 NOTE — ED Notes (Signed)
Pt to vascular.

## 2016-10-28 NOTE — ED Notes (Signed)
phlebotomy at bedside getting labs

## 2016-10-28 NOTE — ED Notes (Signed)
Pt c/o R calf pain, both legs are swollen with pitting edema, but pt startse only the R leg hurts

## 2016-10-28 NOTE — Progress Notes (Signed)
VASCULAR LAB PRELIMINARY  PRELIMINARY  PRELIMINARY  PRELIMINARY  Right lower extremity venous duplex completed.    Preliminary report:  There is no DVT or SVT noted in the right lower extremity.  There is significant interstitial edema noted.   Called results to Dr. Lesia Sago, [facility], RVT 10/28/2016, 4:45 PM

## 2016-10-28 NOTE — ED Provider Notes (Signed)
[facility] DEPT Provider Note   CSN: [id number] Arrival date & time: 10/28/16  1511     History   Chief Complaint Chief Complaint  Patient presents with  . Leg Pain    HPI [name] is a 82 y.o. female.  Patient with multiple medical issues including COPD on 2 L of oxygen, CHF, bronchitis, diabetes, CAD presents with worsening pain and swelling to the right leg. Patient had recent hospitalization 2 weeks prior for multiple different breathing issues. Patient developed worsening right calf pain since Friday. Persistent. Worsening swelling. No injury or fevers.      Past Medical History:  Diagnosis Date  . Anasarca 04/2009   found to be secondary to pericardial effusion with tamponade/pericardial window done  . Chronic diastolic CHF (congestive heart failure) ([place])   . CKD (chronic kidney disease), stage III   . Coronary artery disease    a. mild-mod CAD 2004.  . Diabetes mellitus    insulin dependent  . Herpes labialis    Healing herpes labialis  . Hypercholesterolemia   . Hypertension   . Lumbar radiculopathy   . Lumbar scoliosis   . Morbid obesity ([place])   . Pericardial effusion 04/2009   a. 2010 with tamponade s/p window.  Marland Kitchen PONV (postoperative nausea and vomiting)    has not had any problems since 2001  . Spondylolisthesis   . Spondylosis     Patient Active Problem List   Diagnosis Date Noted  . Acute respiratory failure with hypoxia and hypercarbia ([place]) 10/11/2016  . COPD exacerbation ([place])   . Acute on chronic diastolic CHF (congestive heart failure) ([place])   . Pulmonary hypertension   . Acute renal failure with acute tubular necrosis superimposed on stage 3 chronic kidney disease ([place])   . Controlled diabetes mellitus type 2 with complications ([place])   . Acute encephalopathy   . UTI due to extended-spectrum beta lactamase (ESBL) producing Escherichia coli   . Acute on chronic respiratory failure with hypercapnia ([place])   . Acute bronchitis due to  other specified organisms   . Cough   . Wheezing   . Acute respiratory failure ([place])   . Hypoxia 10/05/2016  . Hyperlipidemia 08/21/2016  . Diabetes mellitus with neurological manifestation ([place]) 06/07/2014  . Pedal edema 10/20/2012  . Chronic diastolic CHF (congestive heart failure) ([place]) 03/18/2011  . Dyslipidemia 03/18/2011  . Diabetes mellitus ([place]) 03/18/2011  . Sciatica neuralgia 03/18/2011  . Hypertension   . Dyspnea   . Anasarca 04/04/2009    Past Surgical History:  Procedure Laterality Date  . BACK SURGERY     microdiskectomy L3-4 on L/partial facetectomy 3-4 on L/removal of synovial cyst on left L3-4  . BREAST BIOPSY     left  . CARDIAC CATHETERIZATION  04/25/2003   L heart cath w/coronary angiography/R femoral artery/ EF 65%/no mitral regurgitation/ angiography L main coronary artery smooth & normal  . EYE SURGERY     ophthalmoscopy/peritomy adjacent to the limbus from the 8 to 2:30 position superiorly  . lumbar laminectomy    . SUBXYPHOID PERICARDIAL WINDOW  04/2009   for pericardial effusion w/pericardial tamponade/sac drained w/20-French Baard drain/transesophageal echocardiogram confimed complete evacution of pericardial fluid  . TOTAL HIP ARTHROPLASTY  03/06/2012   Procedure: TOTAL HIP ARTHROPLASTY;  Surgeon: Kerin Salen, MD;  Location: [facility];  Service: Orthopedics;  Laterality: Left;  DEPUY/PINNACLE, SUMMIT STEM, CUP POLY & CERAMIC  . VAGINAL HYSTERECTOMY      OB History    No  data available       Home Medications    Prior to Admission medications   Medication Sig Start Date End Date Taking? Authorizing Provider  amLODipine (NORVASC) 10 MG tablet Take 10 mg by mouth daily.     Historical Provider, MD  aspirin EC 81 MG tablet Take 81 mg by mouth every morning.    Historical Provider, MD  atorvastatin (LIPITOR) 80 MG tablet Take 40 mg by mouth every evening.    Historical Provider, MD  ergocalciferol (VITAMIN D2) 50000 UNITS capsule Take 50,000 Units by  mouth once a week. On friday 07/30/11   Historical Provider, MD  fluticasone (FLONASE) 50 MCG/ACT nasal spray Place 2 sprays into both nostrils daily. 10/15/16   Orson Eva, MD  furosemide (LASIX) 80 MG tablet Take 1 tablet (80 mg total) by mouth daily. 09/09/16   Thayer Headings, MD  gabapentin (NEURONTIN) 300 MG capsule 2 TABLETS BY MOUTH AT BEDTIME 08/06/16   Thayer Headings, MD  GLIPIZIDE XL 5 MG 24 hr tablet Take 5 mg by mouth Daily. 06/12/12   Historical Provider, MD  guaiFENesin (ROBITUSSIN) 100 MG/5ML liquid Take 5-10 mLs (100-200 mg total) by mouth every 4 (four) hours as needed for cough. 08/14/15   Tatyana Kirichenko, PA-C  insulin aspart (NOVOLOG) 100 UNIT/ML injection Inject into the skin as directed. Based on a sliding scale. Inject 3-4 units into the skin daily with breakfast, inject 3-4 units into the skin with lunch. Inject 8 units into the skin daily with dinner.    Historical Provider, MD  insulin glargine (LANTUS) 100 UNIT/ML injection Inject 20 Units into the skin at bedtime.     Historical Provider, MD  IRON PO Take 1 tablet by mouth daily.     Historical Provider, MD  metoprolol tartrate (LOPRESSOR) 25 MG tablet Take 25 mg by mouth 2 (two) times daily.    Historical Provider, MD  Polyethyl Glycol-Propyl Glycol (SYSTANE) 0.4-0.3 % SOLN Apply 2 drops to eye daily as needed.    Historical Provider, MD  predniSONE (DELTASONE) 10 MG tablet Take 6 tablets (60 mg total) by mouth daily with breakfast. And decrease by one tablet daily 10/16/16   Orson Eva, MD    Family History Family History  Problem Relation Age of Onset  . Heart attack Father   . Hypertension Father   . Stroke Mother   . Angina Mother   . Coronary artery disease      Social History Social History  Substance Use Topics  . Smoking status: Never Smoker  . Smokeless tobacco: Never Used  . Alcohol use No     Allergies   Codeine; Lactose intolerance (gi); Lodine [etodolac]; Percocet [oxycodone-acetaminophen];  Sulfa drugs cross reactors; and Sulfamethoxazole   Review of Systems Review of Systems  Constitutional: Negative for chills and fever.  HENT: Negative for congestion.   Eyes: Negative for visual disturbance.  Respiratory: Positive for shortness of breath (chronic).   Cardiovascular: Positive for leg swelling. Negative for chest pain.  Gastrointestinal: Negative for abdominal pain and vomiting.  Genitourinary: Negative for dysuria and flank pain.  Musculoskeletal: Negative for back pain, neck pain and neck stiffness.  Skin: Negative for rash.  Neurological: Negative for light-headedness and headaches.     Physical Exam Updated Vital Signs BP 135/56   Pulse (!) 58   Temp 98.2 F (36.8 C) (Oral)   Resp 18   Ht 5\' 4"  (1.626 m)   Wt 217 lb (98.4 kg)   SpO2  99%   BMI 37.25 kg/m   Physical Exam  Constitutional: She appears well-developed and well-nourished. No distress.  HENT:  Head: Normocephalic and atraumatic.  Neck: Neck supple.  Cardiovascular: Normal rate and regular rhythm.   Pulmonary/Chest: Effort normal and breath sounds normal.  Abdominal: Soft.  Musculoskeletal: She exhibits edema and tenderness.  Neurological: She is alert.  Skin: Skin is warm and dry.  Patient with 2+ pitting edema bilateral lower extremity worse than the right. Mild calf tenderness on the right. 2+ distal pulses in the right lower extremity. No knee tenderness.  Psychiatric: She has a normal mood and affect.  Nursing note and vitals reviewed.    ED Treatments / Results  Labs (all labs ordered are listed, but only abnormal results are displayed) Labs Reviewed  I-STAT CHEM 8, ED - Abnormal; Notable for the following:       Result Value   BUN 23 (*)    Creatinine, Ser 1.40 (*)    Glucose, Bld 143 (*)    Hemoglobin 11.6 (*)    HCT 34.0 (*)    All other components within normal limits    EKG  EKG Interpretation None       Radiology No results found.  Procedures Procedures  (including critical care time)  Medications Ordered in ED Medications  HYDROcodone-acetaminophen (NORCO/VICODIN) 5-325 MG per tablet 1 tablet (1 tablet Oral Given 10/28/16 1606)     Initial Impression / Assessment and Plan / ED Course  I have reviewed the triage vital signs and the nursing notes.  Pertinent labs & imaging results that were available during my care of the patient were reviewed by me and considered in my medical decision making (see chart for details).  Clinical Course    Patient with recent loss was a shin and clinical concern for DVT. Plan for ultrasound, pain meds, screening blood work. Patient denies anticoagulant use currently. Neg DVT per phone call.  Results and differential diagnosis were discussed with the patient/parent/guardian. Xrays were independently reviewed by myself.  Close follow up outpatient was discussed, comfortable with the plan.   Medications  HYDROcodone-acetaminophen (NORCO/VICODIN) 5-325 MG per tablet 1 tablet (1 tablet Oral Given 10/28/16 1606)    Vitals:   10/28/16 1519 10/28/16 1713 10/28/16 1730  BP: 148/71 141/60 135/56  Pulse: 68 60 (!) 58  Resp: 20 16 18   Temp: 98.2 F (36.8 C)    TempSrc: Oral    SpO2: 99% 100% 99%  Weight: 217 lb (98.4 kg)    Height: 5\' 4"  (1.626 m)      Final diagnoses:  Leg edema, right    Final Clinical Impressions(s) / ED Diagnoses   Final diagnoses:  Leg edema, right    New Prescriptions New Prescriptions   No medications on file     Elnora Morrison, MD 10/28/16 1800

## 2016-11-01 DIAGNOSIS — N183 Chronic kidney disease, stage 3 (moderate): Secondary | ICD-10-CM | POA: Diagnosis not present

## 2016-11-01 DIAGNOSIS — E1122 Type 2 diabetes mellitus with diabetic chronic kidney disease: Secondary | ICD-10-CM | POA: Diagnosis not present

## 2016-11-01 DIAGNOSIS — I2510 Atherosclerotic heart disease of native coronary artery without angina pectoris: Secondary | ICD-10-CM | POA: Diagnosis not present

## 2016-11-01 DIAGNOSIS — I5033 Acute on chronic diastolic (congestive) heart failure: Secondary | ICD-10-CM | POA: Diagnosis not present

## 2016-11-01 DIAGNOSIS — J441 Chronic obstructive pulmonary disease with (acute) exacerbation: Secondary | ICD-10-CM | POA: Diagnosis not present

## 2016-11-01 DIAGNOSIS — I130 Hypertensive heart and chronic kidney disease with heart failure and stage 1 through stage 4 chronic kidney disease, or unspecified chronic kidney disease: Secondary | ICD-10-CM | POA: Diagnosis not present

## 2016-11-15 DIAGNOSIS — E1142 Type 2 diabetes mellitus with diabetic polyneuropathy: Secondary | ICD-10-CM | POA: Diagnosis not present

## 2016-11-15 DIAGNOSIS — N182 Chronic kidney disease, stage 2 (mild): Secondary | ICD-10-CM | POA: Diagnosis not present

## 2016-11-15 DIAGNOSIS — E559 Vitamin D deficiency, unspecified: Secondary | ICD-10-CM | POA: Diagnosis not present

## 2016-11-19 DIAGNOSIS — I130 Hypertensive heart and chronic kidney disease with heart failure and stage 1 through stage 4 chronic kidney disease, or unspecified chronic kidney disease: Secondary | ICD-10-CM | POA: Diagnosis not present

## 2016-11-19 DIAGNOSIS — Z794 Long term (current) use of insulin: Secondary | ICD-10-CM | POA: Diagnosis not present

## 2016-11-19 DIAGNOSIS — E559 Vitamin D deficiency, unspecified: Secondary | ICD-10-CM | POA: Diagnosis not present

## 2016-11-19 DIAGNOSIS — D631 Anemia in chronic kidney disease: Secondary | ICD-10-CM | POA: Diagnosis not present

## 2016-11-19 DIAGNOSIS — E1142 Type 2 diabetes mellitus with diabetic polyneuropathy: Secondary | ICD-10-CM | POA: Diagnosis not present

## 2016-11-19 DIAGNOSIS — I2510 Atherosclerotic heart disease of native coronary artery without angina pectoris: Secondary | ICD-10-CM | POA: Diagnosis not present

## 2016-11-19 DIAGNOSIS — E782 Mixed hyperlipidemia: Secondary | ICD-10-CM | POA: Diagnosis not present

## 2016-11-19 DIAGNOSIS — I5030 Unspecified diastolic (congestive) heart failure: Secondary | ICD-10-CM | POA: Diagnosis not present

## 2016-11-19 DIAGNOSIS — E113393 Type 2 diabetes mellitus with moderate nonproliferative diabetic retinopathy without macular edema, bilateral: Secondary | ICD-10-CM | POA: Diagnosis not present

## 2016-11-19 DIAGNOSIS — I517 Cardiomegaly: Secondary | ICD-10-CM | POA: Diagnosis not present

## 2016-11-19 DIAGNOSIS — N393 Stress incontinence (female) (male): Secondary | ICD-10-CM | POA: Diagnosis not present

## 2016-11-19 DIAGNOSIS — N183 Chronic kidney disease, stage 3 (moderate): Secondary | ICD-10-CM | POA: Diagnosis not present

## 2016-11-27 ENCOUNTER — Ambulatory Visit (INDEPENDENT_AMBULATORY_CARE_PROVIDER_SITE_OTHER): Payer: Medicare Other | Admitting: Cardiovascular Disease

## 2016-11-27 ENCOUNTER — Telehealth: Payer: Self-pay

## 2016-11-27 ENCOUNTER — Encounter: Payer: Self-pay | Admitting: Cardiovascular Disease

## 2016-11-27 VITALS — BP 190/90 | HR 68 | Ht 64.0 in | Wt 217.8 lb

## 2016-11-27 DIAGNOSIS — I10 Essential (primary) hypertension: Secondary | ICD-10-CM

## 2016-11-27 DIAGNOSIS — I5033 Acute on chronic diastolic (congestive) heart failure: Secondary | ICD-10-CM

## 2016-11-27 MED ORDER — HYDRALAZINE HCL 25 MG PO TABS: 25.0000 mg | ORAL_TABLET | Freq: Three times a day (TID) | ORAL | 3 refills | Status: DC

## 2016-11-27 NOTE — Telephone Encounter (Signed)
[name] about discontinuing patient's oxygen therapy. Beverely Low with [facility] stated that our office would need to send a fax to them of changes. Received fax information and will send today's office note to them once completed.

## 2016-11-27 NOTE — Progress Notes (Signed)
Cardiology Office Note   Date:  11/27/2016   ID:  RUSSCHELLE SLEET, DOB [birth year], MRN [id number]  PCP:  Limmie Patricia, MD  Cardiologist: Darlin Coco MD, now [place]  Chief Complaint  Patient presents with  . Follow-up    Chronic diastolic CHF   Problem list 1. Chronic diastolic congestive heart failure 2. History of pericardial effusion-status post pericardial window 3.  Aortic stenosis 4. Diabetes mellitus 5. Coronary artery disease 6. Essential hypertension 7. Hyperlipidemia   Notes from [place] [place] - Feb. 2017:  KEMANI PURSLEY is a 83 y.o. female who presents for a six-month follow-up visit   She has a past history of diastolic congestive heart failure. Her last echocardiogram on 02/10/12 showed an ejection fraction of 60% with normal systolic function and grade 1 diastolic dysfunction. She has a past history of pericardial effusion with pericardial tamponade and in 2010 and underwen a pericardial window with good results. she has a history of mild aortic stenosis. Since we last saw her she underwent left hip replacement by Dr. Mayer Camel and has done well.  Her last echocardiogram was 02/10/12 and showed an ejection fraction of 60%. Since last visit she has been feeling well. Since last visit her husband has been diagnosed with early Alzheimer's but fortunately he seems to be responding nicely to Aricept.Her last visit we gave her a trial off gabapentin for peripheral neuropathy.Initially she did not think that it helped but now she thinks that it has helped and she would like to try a stronger dose.  We will increase the dose to 600 mg each evening. She reports that she is only taking 80 mg of Lasix daily and this has been sufficient to keep her edema at a minimum. He has not been having any recent chest pain.  She is not having any increase in peripheral edema on the lower dose of Lasix.  Her breathing is improved.  Oct. 18, 2017: Still some dyspnea. Does not get any  regular  exercise Lots of back and leg pain   Jan. 24, 2018:  Was admitted to the hospital in Dec for shortness of breath .  Was found to have Grade 2 diastolic dysfunction  Was found to have CKD - Diovan was stopped.  BP has been elevated since that time   Past Medical History:  Diagnosis Date  . Anasarca 04/2009   found to be secondary to pericardial effusion with tamponade/pericardial window done  . Chronic diastolic CHF (congestive heart failure) ([place])   . CKD (chronic kidney disease), stage III   . Coronary artery disease    a. mild-mod CAD 2004.  . Diabetes mellitus    insulin dependent  . Herpes labialis    Healing herpes labialis  . Hypercholesterolemia   . Hypertension   . Lumbar radiculopathy   . Lumbar scoliosis   . Morbid obesity ([place])   . Pericardial effusion 04/2009   a. 2010 with tamponade s/p window.  Marland Kitchen PONV (postoperative nausea and vomiting)    has not had any problems since 2001  . Spondylolisthesis   . Spondylosis     Past Surgical History:  Procedure Laterality Date  . BACK SURGERY     microdiskectomy L3-4 on L/partial facetectomy 3-4 on L/removal of synovial cyst on left L3-4  . BREAST BIOPSY     left  . CARDIAC CATHETERIZATION  04/25/2003   L heart cath w/coronary angiography/R femoral artery/ EF 65%/no mitral regurgitation/ angiography L main coronary artery smooth &  normal  . EYE SURGERY     ophthalmoscopy/peritomy adjacent to the limbus from the 8 to 2:30 position superiorly  . lumbar laminectomy    . SUBXYPHOID PERICARDIAL WINDOW  04/2009   for pericardial effusion w/pericardial tamponade/sac drained w/20-French Baard drain/transesophageal echocardiogram confimed complete evacution of pericardial fluid  . TOTAL HIP ARTHROPLASTY  03/06/2012   Procedure: TOTAL HIP ARTHROPLASTY;  Surgeon: Kerin Salen, MD;  Location: [facility];  Service: Orthopedics;  Laterality: Left;  DEPUY/PINNACLE, SUMMIT STEM, CUP POLY & CERAMIC  . VAGINAL HYSTERECTOMY        Current Outpatient Prescriptions  Medication Sig Dispense Refill  . amLODipine (NORVASC) 10 MG tablet Take 10 mg by mouth daily.     Marland Kitchen aspirin EC 81 MG tablet Take 81 mg by mouth every morning.    Marland Kitchen atorvastatin (LIPITOR) 80 MG tablet Take 40 mg by mouth every evening.    . ergocalciferol (VITAMIN D2) 50000 UNITS capsule Take 50,000 Units by mouth once a week. On friday    . fluticasone (FLONASE) 50 MCG/ACT nasal spray Place 2 sprays into both nostrils daily. 16 g 1  . furosemide (LASIX) 80 MG tablet Take 1 tablet (80 mg total) by mouth daily. 30 tablet 5  . gabapentin (NEURONTIN) 300 MG capsule 2 TABLETS BY MOUTH AT BEDTIME 60 capsule 1  . GLIPIZIDE XL 5 MG 24 hr tablet Take 5 mg by mouth Daily.    Marland Kitchen HYDROcodone-acetaminophen (NORCO) 5-325 MG tablet Take 1 tablet by mouth every 4 (four) hours as needed. 6 tablet 0  . insulin aspart (NOVOLOG) 100 UNIT/ML injection Inject into the skin as directed. Based on a sliding scale. Inject 3-4 units into the skin daily with breakfast, inject 3-4 units into the skin with lunch. Inject 8 units into the skin daily with dinner.    . insulin glargine (LANTUS) 100 UNIT/ML injection Inject 20 Units into the skin at bedtime.     . IRON PO Take 1 tablet by mouth daily.     . metoprolol tartrate (LOPRESSOR) 25 MG tablet Take 25 mg by mouth 2 (two) times daily.    Vladimir Faster Glycol-Propyl Glycol (SYSTANE) 0.4-0.3 % SOLN Apply 2 drops to eye daily as needed.     No current facility-administered medications for this visit.     Allergies:   Atenolol; Codeine; Lactose intolerance (gi); Lodine [etodolac]; Percocet [oxycodone-acetaminophen]; Sulfa drugs cross reactors; Sulfamethoxazole; Benzonatate; Escitalopram oxalate; Metformin; and Pregabalin    Social History:  The patient  reports that she has never smoked. She has never used smokeless tobacco. She reports that she does not drink alcohol or use drugs.   Family History:  The patient's family history  includes Angina in her mother; Heart attack in her father; Hypertension in her father; Stroke in her mother.    ROS:  Please see the history of present illness.   Otherwise, review of systems are positive for none.   All other systems are reviewed and negative.    PHYSICAL EXAM: VS:  BP (!) 190/90 (BP Location: Right Arm, Patient Position: Sitting, Cuff Size: Large)   Pulse 68   Ht 5\' 4"  (1.626 m)   Wt 217 lb 12.8 oz (98.8 kg)   BMI 37.39 kg/m  , BMI Body mass index is 37.39 kg/m. GEN: Well nourished, well developed, in no acute distress  HEENT: normal  Neck: no JVD, carotid bruits, or masses Cardiac: RRR; no murmurs, rubs, or gallops, 1-2 + edema  Respiratory:  clear to  auscultation bilaterally, normal work of breathing GI: soft, nontender, nondistended, + BS MS: no deformity or atrophy  Skin: warm and dry, no rash Neuro:  Strength and sensation are intact Psych: euthymic mood, full affect   EKG:  EKG is ordered today. The ekg ordered today demonstrates sinus bradycardia.  Left axis deviation.  Since last tracing of 08/14/15, no significant change.   Recent Labs: 10/06/2016: ALT 16 10/08/2016: TSH 0.378 10/09/2016: B Natriuretic Peptide 446.7 10/10/2016: Magnesium 2.8; Platelets 218 10/28/2016: BUN 23; Creatinine, Ser 1.40; Hemoglobin 11.6; Potassium 3.9; Sodium 143    Lipid Panel    Component Value Date/Time   CHOL  04/21/2009 0620    85        ATP III CLASSIFICATION:  <200     mg/dL   Desirable  200-239  mg/dL   Borderline High  >=240    mg/dL   High          TRIG 93 04/21/2009 0620   HDL 27 (L) 04/21/2009 0620   CHOLHDL 3.1 04/21/2009 0620   VLDL 19 04/21/2009 0620   LDLCALC  04/21/2009 0620    39        Total Cholesterol/HDL:CHD Risk Coronary Heart Disease Risk Table                     Men   Women  1/2 Average Risk   3.4   3.3  Average Risk       5.0   4.4  2 X Average Risk   9.6   7.1  3 X Average Risk  23.4   11.0        Use the calculated Patient  Ratio above and the CHD Risk Table to determine the patient's CHD Risk.        ATP III CLASSIFICATION (LDL):  <100     mg/dL   Optimal  100-129  mg/dL   Near or Above                    Optimal  130-159  mg/dL   Borderline  160-189  mg/dL   High  >190     mg/dL   Very High      Wt Readings from Last 3 Encounters:  11/27/16 217 lb 12.8 oz (98.8 kg)  10/28/16 217 lb (98.4 kg)  10/15/16 224 lb 4.8 oz (101.7 kg)       ASSESSMENT AND PLAN:  1.  Chronic diastolic heart failure, table on current Lasix 80 mg daily  She was hospitalized a month ago with respiratory failure. She was found have probable COPD. She also had diastolic congestive heart failure. She was started on home oxygen. She wants to come off the home oxygen and has not used it in several days. We will check her oxygen saturation with exertion. Her oxygen saturation stayed at 96% with ambulation. I think we can safely stop her home oxygen at this point.  I'll see her in 6 month.  2. status post pericardial window for pericardial effusion with pericardial tamponade in 2010  3. diabetes mellitus with diabetic neuropathy  4. essential hypertension:   BP is elevated today . Will add hydralazine 25 mg BID.     Continue same medications. I've advised her to decrease intake of salt and pre-prepared foods. She seems to eat a lot of prepared foods and salty foods. I've advised her to elevate her legs higher than her heart. She typically has not been elevating  her legs and off.  Current medicines are reviewed at length with the patient today.  The patient does not have concerns regarding medicines.    Labs/ tests ordered today include:   No orders of the defined types were placed in this encounter.    Mertie Moores, MD  11/27/2016 2:39 PM    [address],  [address], [place]  [address] Pager [phone] Phone: [phone]; Fax: [phone]

## 2016-11-27 NOTE — Patient Instructions (Signed)
Medication Instructions:  START Hydralazine 25 mg twice daily You may stop your home oxygen  Labwork: None Ordered   Testing/Procedures: None Ordered   Follow-Up: Your physician wants you to follow-up in: 6 months with Dr. Acie Fredrickson.  You will receive a reminder letter in the mail two months in advance. If you don't receive a letter, please call our office to schedule the follow-up appointment.   If you need a refill on your cardiac medications before your next appointment, please call your pharmacy.   Thank you for choosing [facility]! Christen Bame, RN [phone]

## 2016-11-28 ENCOUNTER — Telehealth: Payer: Self-pay | Admitting: Cardiovascular Disease

## 2016-11-28 DIAGNOSIS — I5032 Chronic diastolic (congestive) heart failure: Secondary | ICD-10-CM

## 2016-11-28 MED ORDER — HYDRALAZINE HCL 25 MG PO TABS: 25.0000 mg | ORAL_TABLET | Freq: Two times a day (BID) | ORAL | 3 refills | Status: DC

## 2016-11-28 NOTE — Telephone Encounter (Signed)
New Message  Pt c/o medication issue:  1. Name of Medication: hydralazine (Apresoline) 25 mg tablet three times daily  2. How are you currently taking this medication (dosage and times per day)? See above  3. Are you having a reaction (difficulty breathing--STAT)? N/A  4. What is your medication issue? Pt voiced wanting to know the correct daily dosage amount. Pt voiced bottle states three but paper is two. Pt voiced thinking twice daily is correct.  Please f/u with pt

## 2016-11-28 NOTE — Telephone Encounter (Signed)
Corene Cornea is calling from [facility] on behalf of patient, he states that he needs our office to send over a prescription for DC O2 to validate the office notes sent by Dr. Acie Fredrickson. Corene Cornea also states you may send a staff message to [facility]. Corene Cornea can be reached at [phone] at anytime. Thanks,

## 2016-11-28 NOTE — Telephone Encounter (Signed)
Spoke with patient and advised her that the hydralazine should be prescribed for twice daily not three times daily. I advised her that I have fixed the Rx to reflect the correct information and verified that her discharge instructions state the information correctly. She verbalized understanding and agreement and thanked me for the call.   Left message for Jiles Crocker from [facility] to call back regarding order to d/c O2.

## 2016-11-28 NOTE — Telephone Encounter (Signed)
Spoke with Corene Cornea from [facility] who advised that D/C all O2 be placed in comments section of oxygen order in epic.  I thanked him for his help; order placed in epic and notified Dr. Acie Fredrickson to sign order

## 2016-11-29 ENCOUNTER — Telehealth: Payer: Self-pay | Admitting: Internal Medicine

## 2016-11-29 NOTE — Telephone Encounter (Signed)
Pt request to be Dr. Sharlet [name] new pt, pt stated her spouse Agnes Hayslip is already see Dr. Sharlet [name]. She has Medicare for insurance. Please advise.

## 2016-12-02 NOTE — Telephone Encounter (Signed)
Fine

## 2016-12-03 ENCOUNTER — Encounter: Payer: Self-pay | Admitting: Internal Medicine

## 2016-12-03 ENCOUNTER — Ambulatory Visit (INDEPENDENT_AMBULATORY_CARE_PROVIDER_SITE_OTHER): Payer: Medicare Other | Admitting: Internal Medicine

## 2016-12-03 DIAGNOSIS — N183 Chronic kidney disease, stage 3 unspecified: Secondary | ICD-10-CM

## 2016-12-03 DIAGNOSIS — Z794 Long term (current) use of insulin: Secondary | ICD-10-CM

## 2016-12-03 DIAGNOSIS — H60391 Other infective otitis externa, right ear: Secondary | ICD-10-CM

## 2016-12-03 DIAGNOSIS — H6091 Unspecified otitis externa, right ear: Secondary | ICD-10-CM | POA: Insufficient documentation

## 2016-12-03 DIAGNOSIS — E0842 Diabetes mellitus due to underlying condition with diabetic polyneuropathy: Secondary | ICD-10-CM

## 2016-12-03 DIAGNOSIS — I10 Essential (primary) hypertension: Secondary | ICD-10-CM

## 2016-12-03 MED ORDER — NEOMYCIN-POLYMYXIN-HC 3.5-10000-1 OT SUSP: 3.0000 [drp] | Freq: Three times a day (TID) | OTIC | 0 refills | Status: DC

## 2016-12-03 NOTE — Progress Notes (Signed)
   Subjective:    Patient ID: [name], female    DOB: May 12, 1931, 83 y.o.   MRN: [id number]  HPI The patient is a new 83 YO female coming in for new concern of right ear pain (started while she was on home oxygen, some blood draining on the pillow at night time, pain in the ear, hearing loss, not much nasal drainage or sinus pressure, no fevers or chills) as well as continuation of her medical care including her: hypertension (cardiology just added hydralazine about 1 week ago, also taking amlodipine and metoprolol and lasix daily, does have CKD stage 3 which is related to either her blood pressure or her diabetes, no headaches or chest pains), and her ckd stage 3 (taken off diovan while in the hospital several months back, kidney levels stable since that time and mildly improved, not taking any nsaids at home).   PMH, [facility], social history reviewed and updated.   Review of Systems  Constitutional: Negative for activity change, appetite change, fatigue, fever and unexpected weight change.  HENT: Positive for ear discharge and ear pain. Negative for congestion, dental problem, postnasal drip, rhinorrhea, sinus pain, sinus pressure, sneezing and trouble swallowing.   Eyes: Negative.   Respiratory: Negative for cough, chest tightness, shortness of breath and wheezing.   Cardiovascular: Positive for leg swelling. Negative for chest pain and palpitations.  Gastrointestinal: Negative.   Musculoskeletal: Positive for arthralgias and back pain. Negative for gait problem, myalgias, neck pain and neck stiffness.  Skin: Negative.   Neurological: Positive for numbness. Negative for dizziness, tremors, seizures, weakness and headaches.  Psychiatric/Behavioral: Negative.       Objective:   Physical Exam  Constitutional: She is oriented to person, place, and time. She appears well-developed and well-nourished.  HENT:  Head: Normocephalic and atraumatic.  Left Ear: External ear normal.  Nose: Nose  normal.  Mouth/Throat: Oropharynx is clear and moist.  Right ear with otitis externa.   Eyes: EOM are normal.  Neck: Normal range of motion. No JVD present.  Cardiovascular: Normal rate and regular rhythm.   Pulmonary/Chest: Effort normal and breath sounds normal.  Abdominal: Soft.  Musculoskeletal: She exhibits edema.  2+ edema bilaterally  Lymphadenopathy:    She has no cervical adenopathy.  Neurological: She is alert and oriented to person, place, and time.  Skin: Skin is warm and dry.  Psychiatric: She has a normal mood and affect.   Vitals:   12/03/16 1251 12/03/16 1344  BP: (!) 160/80 (!) 150/82  Pulse: 75   Resp: 14   Temp: 98.1 F (36.7 C)   TempSrc: Oral   SpO2: 94%   Weight: 216 lb (98 kg)   Height: 5\' 4"  (1.626 m)       Assessment & Plan:

## 2016-12-03 NOTE — Progress Notes (Signed)
Pre visit review using our clinic review tool, if applicable. No additional management support is needed unless otherwise documented below in the visit note. 

## 2016-12-03 NOTE — Assessment & Plan Note (Signed)
BP slightly above goal today but much improved since started hydralazine. She will continue amlodipine, hydralazine, lasix, metoprolol and allow some more time for adjustment with the new meds.

## 2016-12-03 NOTE — Assessment & Plan Note (Signed)
BP close to goal, last HgA1c per her reports at goal. Will continue to monitor BP. If her kidney function is still stable at next visit could consider adding back ACE-I or ARB.

## 2016-12-03 NOTE — Assessment & Plan Note (Signed)
Rx for corticosporin ear drops to clear her infection. Some of the hearing change may be from hearing loss as no obstruction with wax.

## 2016-12-03 NOTE — Assessment & Plan Note (Signed)
Seeing Dr. Elyse Hsu for her sugars and last HgA1c 6.3 which may indicate too tight control. She is on glipizide and novolog and lantus which puts her at risk for hypoglycemia although she denies that at this visit.

## 2016-12-03 NOTE — Patient Instructions (Addendum)
We have sent in some ear drops that have an antibiotic in them to help the ear. Use 3 drops in the ear 3 times a day for 3 days.   Call us back if you want to switch the diabetes doctor. They do have a female doctor there.

## 2017-02-13 DIAGNOSIS — N182 Chronic kidney disease, stage 2 (mild): Secondary | ICD-10-CM | POA: Diagnosis not present

## 2017-02-13 DIAGNOSIS — D631 Anemia in chronic kidney disease: Secondary | ICD-10-CM | POA: Diagnosis not present

## 2017-02-13 DIAGNOSIS — E559 Vitamin D deficiency, unspecified: Secondary | ICD-10-CM | POA: Diagnosis not present

## 2017-02-13 DIAGNOSIS — E1142 Type 2 diabetes mellitus with diabetic polyneuropathy: Secondary | ICD-10-CM | POA: Diagnosis not present

## 2017-02-13 DIAGNOSIS — Z794 Long term (current) use of insulin: Secondary | ICD-10-CM | POA: Diagnosis not present

## 2017-02-13 DIAGNOSIS — E782 Mixed hyperlipidemia: Secondary | ICD-10-CM | POA: Diagnosis not present

## 2017-02-18 DIAGNOSIS — E559 Vitamin D deficiency, unspecified: Secondary | ICD-10-CM | POA: Diagnosis not present

## 2017-02-18 DIAGNOSIS — N182 Chronic kidney disease, stage 2 (mild): Secondary | ICD-10-CM | POA: Insufficient documentation

## 2017-02-18 DIAGNOSIS — E782 Mixed hyperlipidemia: Secondary | ICD-10-CM | POA: Diagnosis not present

## 2017-02-18 DIAGNOSIS — D631 Anemia in chronic kidney disease: Secondary | ICD-10-CM | POA: Insufficient documentation

## 2017-02-18 DIAGNOSIS — Z794 Long term (current) use of insulin: Secondary | ICD-10-CM | POA: Diagnosis not present

## 2017-02-18 DIAGNOSIS — I129 Hypertensive chronic kidney disease with stage 1 through stage 4 chronic kidney disease, or unspecified chronic kidney disease: Secondary | ICD-10-CM | POA: Diagnosis not present

## 2017-02-18 DIAGNOSIS — E113393 Type 2 diabetes mellitus with moderate nonproliferative diabetic retinopathy without macular edema, bilateral: Secondary | ICD-10-CM | POA: Diagnosis not present

## 2017-02-18 DIAGNOSIS — E1142 Type 2 diabetes mellitus with diabetic polyneuropathy: Secondary | ICD-10-CM | POA: Insufficient documentation

## 2017-03-18 ENCOUNTER — Other Ambulatory Visit: Payer: Self-pay | Admitting: *Deleted

## 2017-03-18 MED ORDER — FUROSEMIDE 80 MG PO TABS: 80.0000 mg | ORAL_TABLET | Freq: Every day | ORAL | 2 refills | Status: DC

## 2017-04-22 IMAGING — US US RENAL
1 series · 14 of 25 positions shown · non-contrast
Comparison: CT abdomen and pelvis 04/23/2009

CLINICAL DATA: Acute kidney injury. History of hypertension,
diabetes, and chronic kidney disease.

EXAM:
RENAL / URINARY TRACT ULTRASOUND COMPLETE

[Series 1: us renal · 0.26mm/px · 14 of 29 slices shown]
[im 1/29]
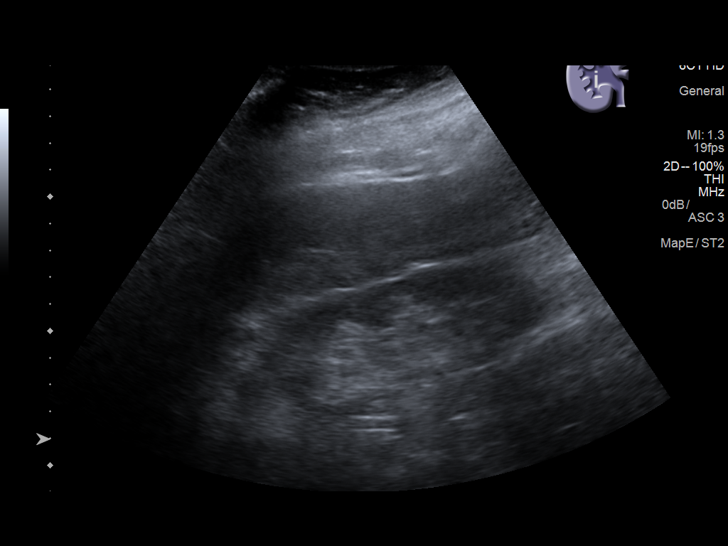
[im 3/29]
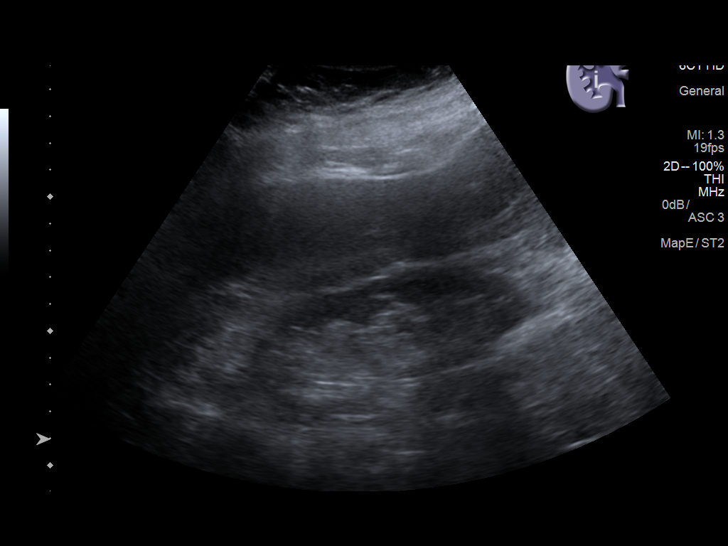
[im 5/29]
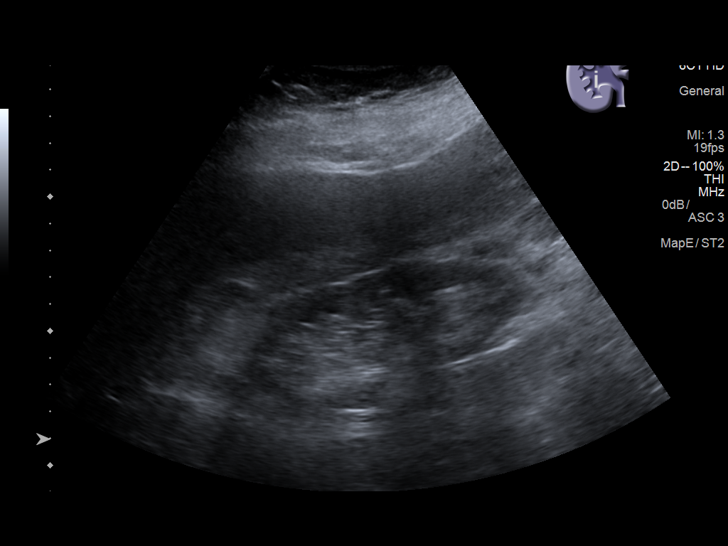
[im 8/29]
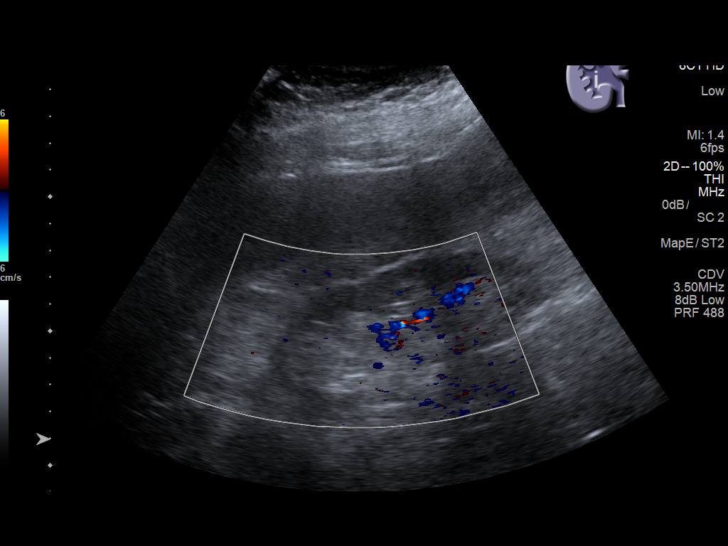
[im 10/29]
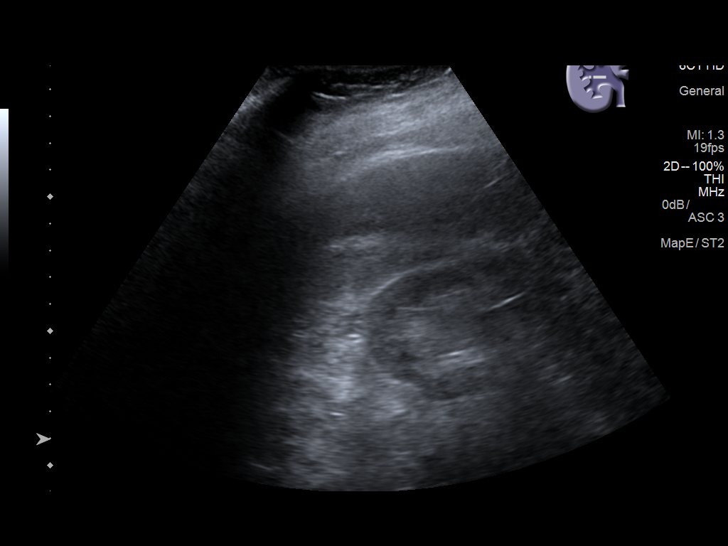
[im 11/29]
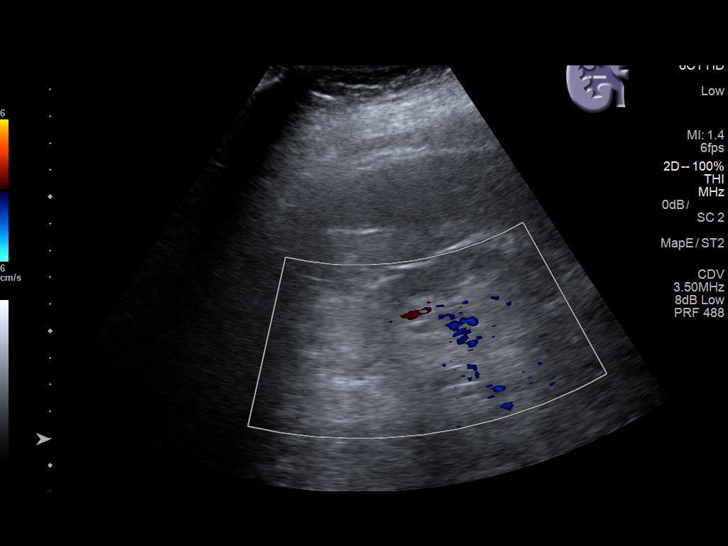
[im 13/29]
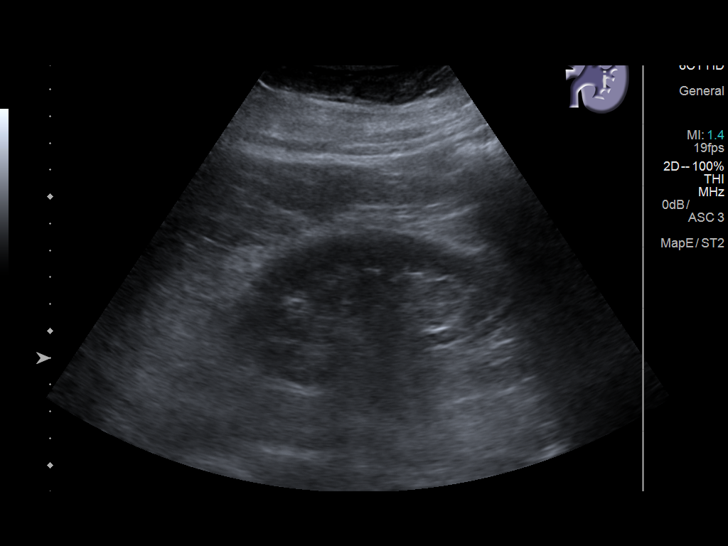
[im 16/29]
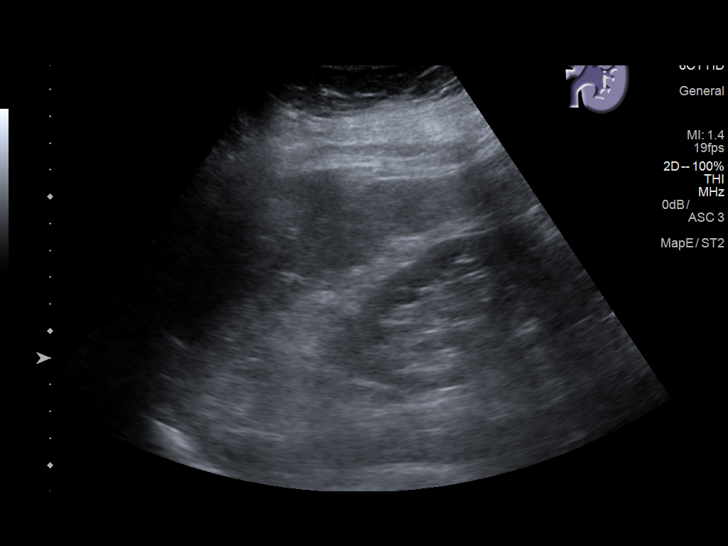
[im 18/29]
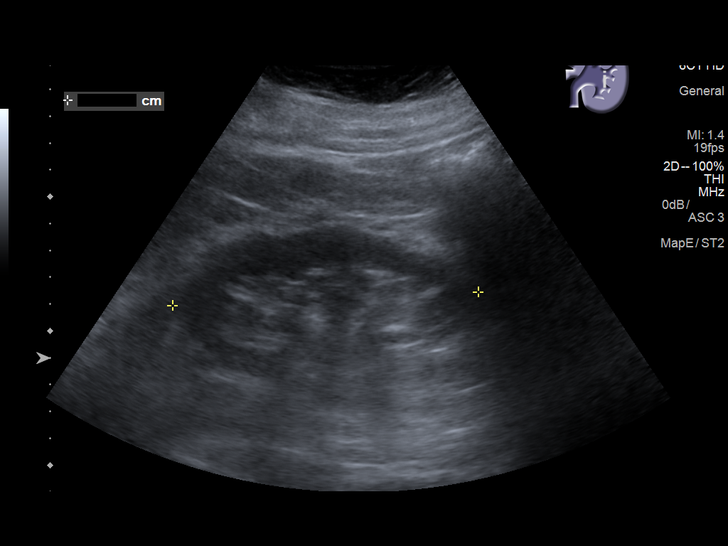
[im 19/29]
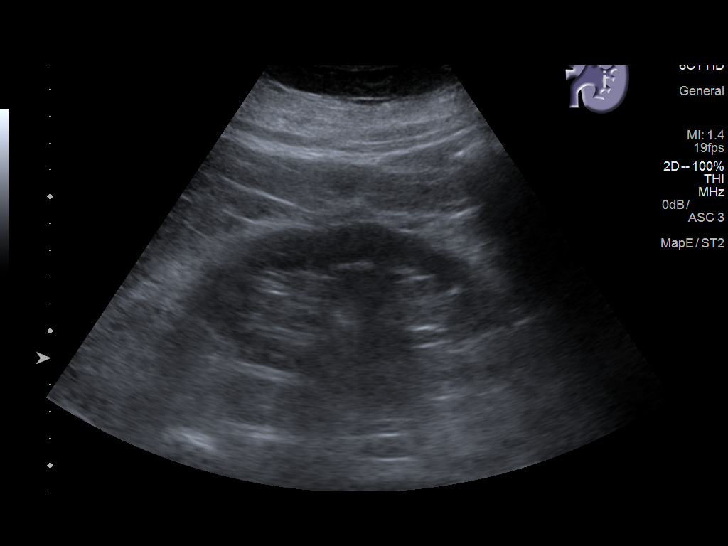
[im 22/29]
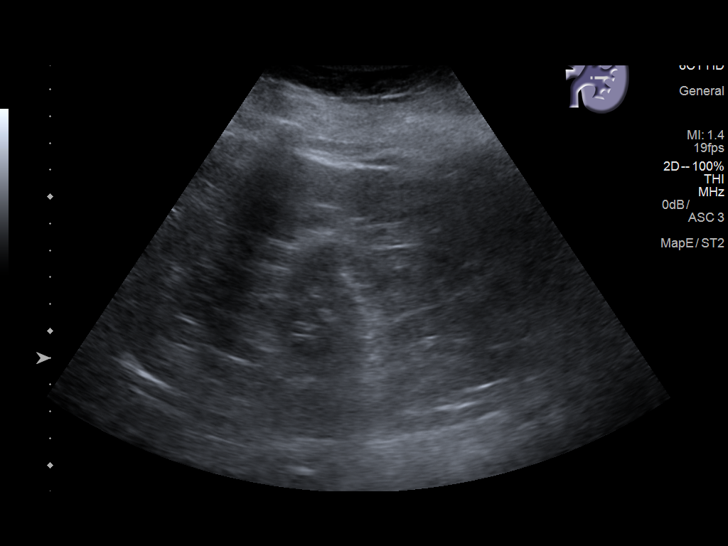
[im 24/29]
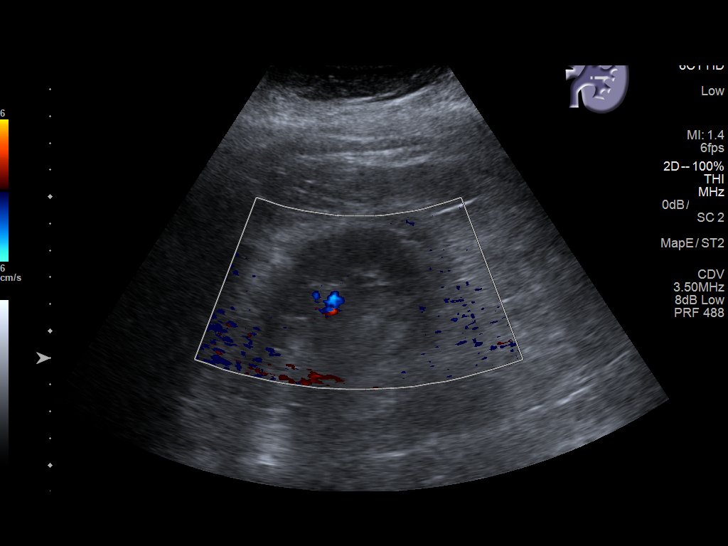
[im 26/29]
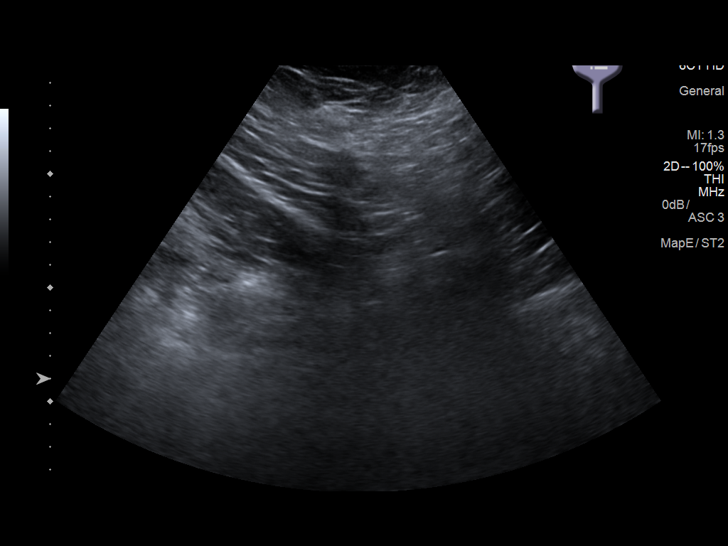
[im 29/29]
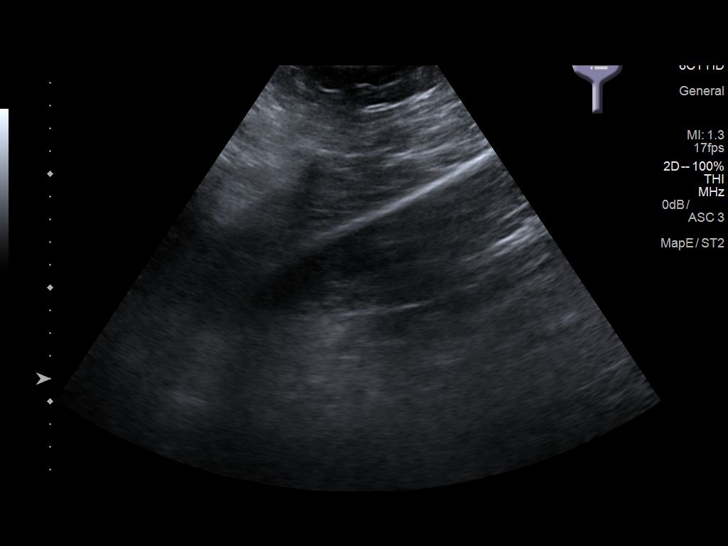

[14 of 25 positions shown; findings below may reference images not displayed]

FINDINGS: Right Kidney:

Length: 11.1 cm. Mild-to-moderate cortical thinning. Echogenicity
within normal limits. No mass or hydronephrosis visualized.

Left Kidney:

Length: 11.4 cm. Mild cortical thinning. Echogenicity within normal
limits. No mass or hydronephrosis visualized.

Bladder:

Decompressed and not visualized.
IMPRESSION: Bilateral renal cortical thinning.  No hydronephrosis.

## 2017-05-14 DIAGNOSIS — E782 Mixed hyperlipidemia: Secondary | ICD-10-CM | POA: Diagnosis not present

## 2017-05-14 DIAGNOSIS — N182 Chronic kidney disease, stage 2 (mild): Secondary | ICD-10-CM | POA: Diagnosis not present

## 2017-05-14 DIAGNOSIS — D631 Anemia in chronic kidney disease: Secondary | ICD-10-CM | POA: Diagnosis not present

## 2017-05-14 DIAGNOSIS — Z794 Long term (current) use of insulin: Secondary | ICD-10-CM | POA: Diagnosis not present

## 2017-05-14 DIAGNOSIS — E1142 Type 2 diabetes mellitus with diabetic polyneuropathy: Secondary | ICD-10-CM | POA: Diagnosis not present

## 2017-05-20 DIAGNOSIS — E1142 Type 2 diabetes mellitus with diabetic polyneuropathy: Secondary | ICD-10-CM | POA: Diagnosis not present

## 2017-05-20 DIAGNOSIS — Z833 Family history of diabetes mellitus: Secondary | ICD-10-CM | POA: Diagnosis not present

## 2017-05-20 DIAGNOSIS — E559 Vitamin D deficiency, unspecified: Secondary | ICD-10-CM | POA: Diagnosis not present

## 2017-05-20 DIAGNOSIS — Z794 Long term (current) use of insulin: Secondary | ICD-10-CM | POA: Diagnosis not present

## 2017-05-20 DIAGNOSIS — D631 Anemia in chronic kidney disease: Secondary | ICD-10-CM | POA: Diagnosis not present

## 2017-05-20 DIAGNOSIS — E1122 Type 2 diabetes mellitus with diabetic chronic kidney disease: Secondary | ICD-10-CM | POA: Diagnosis not present

## 2017-05-20 DIAGNOSIS — E782 Mixed hyperlipidemia: Secondary | ICD-10-CM | POA: Diagnosis not present

## 2017-05-20 DIAGNOSIS — E113393 Type 2 diabetes mellitus with moderate nonproliferative diabetic retinopathy without macular edema, bilateral: Secondary | ICD-10-CM | POA: Diagnosis not present

## 2017-05-20 DIAGNOSIS — N182 Chronic kidney disease, stage 2 (mild): Secondary | ICD-10-CM | POA: Diagnosis not present

## 2017-05-20 DIAGNOSIS — I129 Hypertensive chronic kidney disease with stage 1 through stage 4 chronic kidney disease, or unspecified chronic kidney disease: Secondary | ICD-10-CM | POA: Diagnosis not present

## 2017-05-26 ENCOUNTER — Other Ambulatory Visit: Payer: Self-pay

## 2017-06-24 DIAGNOSIS — R300 Dysuria: Secondary | ICD-10-CM | POA: Diagnosis not present

## 2017-06-24 DIAGNOSIS — N3000 Acute cystitis without hematuria: Secondary | ICD-10-CM | POA: Diagnosis not present

## 2017-06-24 DIAGNOSIS — N3946 Mixed incontinence: Secondary | ICD-10-CM | POA: Diagnosis not present

## 2017-07-01 ENCOUNTER — Encounter: Payer: Self-pay | Admitting: Cardiovascular Disease

## 2017-07-01 ENCOUNTER — Ambulatory Visit (INDEPENDENT_AMBULATORY_CARE_PROVIDER_SITE_OTHER): Payer: Medicare Other | Admitting: Cardiovascular Disease

## 2017-07-01 VITALS — BP 132/70 | HR 65 | Ht 64.0 in | Wt 212.4 lb

## 2017-07-01 DIAGNOSIS — I5032 Chronic diastolic (congestive) heart failure: Secondary | ICD-10-CM | POA: Diagnosis not present

## 2017-07-01 NOTE — Patient Instructions (Addendum)
Medication Instructions:  Your physician recommends that you continue on your current medications as directed. Please refer to the Current Medication list given to you today.   Labwork: None Ordered   Testing/Procedures: None Ordered   Follow-Up: Your physician wants you to follow-up in: 6 months with Dr. Nahser. You will receive a reminder letter in the mail two months in advance. If you don't receive a letter, please call our office to schedule the follow-up appointment.   If you need a refill on your cardiac medications before your next appointment, please call your pharmacy.   Thank you for choosing [facility]! Michelle Swinyer, RN [phone]     For your  leg edema you  should do  the following 1. Leg elevation - I recommend the Lounge Dr. Leg rest.  See below for details  2. Salt restriction  -  Use potassium chloride instead of regular salt as a salt substitute. 3. Walk regularly 4. Compression hose - [facility] 5. Weight loss    Available on [email or web address] Or  Go to [email or web address]     / 

## 2017-07-01 NOTE — Progress Notes (Signed)
Cardiology Office Note   Date:  07/01/2017   ID:  [name], DOB [birth year], MRN [id number]  PCP:  Hoyt Koch, MD  Cardiologist: Darlin Coco MD, now [place]  Chief Complaint  Patient presents with  . Follow-up    CHF    Problem list 1. Chronic diastolic congestive heart failure 2. History of pericardial effusion-status post pericardial window 3.  Aortic stenosis 4. Diabetes mellitus 5. Coronary artery disease 6. Essential hypertension 7. Hyperlipidemia   Notes from [place] [facility] - Feb. 2017:  [name] is a 83 y.o. female who presents for a six-month follow-up visit   She has a past history of diastolic congestive heart failure. Her last echocardiogram on 02/10/12 showed an ejection fraction of 60% with normal systolic function and grade 1 diastolic dysfunction. She has a past history of pericardial effusion with pericardial tamponade and in 2010 and underwen a pericardial window with good results. she has a history of mild aortic stenosis. Since we last saw her she underwent left hip replacement by Dr. Mayer Camel and has done well.  Her last echocardiogram was 02/10/12 and showed an ejection fraction of 60%. Since last visit she has been feeling well. Since last visit her husband has been diagnosed with early Alzheimer's but fortunately he seems to be responding nicely to Aricept.Her last visit we gave her a trial off gabapentin for peripheral neuropathy.Initially she did not think that it helped but now she thinks that it has helped and she would like to try a stronger dose.  We will increase the dose to 600 mg each evening. She reports that she is only taking 80 mg of Lasix daily and this has been sufficient to keep her edema at a minimum. He has not been having any recent chest pain.  She is not having any increase in peripheral edema on the lower dose of Lasix.  Her breathing is improved.  Oct. 18, 2017: Still some dyspnea. Does not get any regular   exercise Lots of back and leg pain   Jan. 24, 2018:  Was admitted to the hospital in Dec for shortness of breath .  Was found to have Grade 2 diastolic dysfunction  Was found to have CKD - Diovan was stopped.  BP has been elevated since that time   Aug. 28, 2018:  [name] is seen today .    She does have DOE.  Does not exercise.  Has chronic diastolic CHF  Past Medical History:  Diagnosis Date  . Anasarca 04/2009   found to be secondary to pericardial effusion with tamponade/pericardial window done  . Chronic diastolic CHF (congestive heart failure) ([place])   . CKD (chronic kidney disease), stage III   . Coronary artery disease    a. mild-mod CAD 2004.  . Diabetes mellitus    insulin dependent  . Herpes labialis    Healing herpes labialis  . Hypercholesterolemia   . Hypertension   . Lumbar radiculopathy   . Lumbar scoliosis   . Morbid obesity ([place])   . Pericardial effusion 04/2009   a. 2010 with tamponade s/p window.  Marland Kitchen PONV (postoperative nausea and vomiting)    has not had any problems since 2001  . Spondylolisthesis   . Spondylosis     Past Surgical History:  Procedure Laterality Date  . BACK SURGERY     microdiskectomy L3-4 on L/partial facetectomy 3-4 on L/removal of synovial cyst on left L3-4  . BREAST BIOPSY     left  .  CARDIAC CATHETERIZATION  04/25/2003   L heart cath w/coronary angiography/R femoral artery/ EF 65%/no mitral regurgitation/ angiography L main coronary artery smooth & normal  . EYE SURGERY     ophthalmoscopy/peritomy adjacent to the limbus from the 8 to 2:30 position superiorly  . lumbar laminectomy    . SUBXYPHOID PERICARDIAL WINDOW  04/2009   for pericardial effusion w/pericardial tamponade/sac drained w/20-French Baard drain/transesophageal echocardiogram confimed complete evacution of pericardial fluid  . TOTAL HIP ARTHROPLASTY  03/06/2012   Procedure: TOTAL HIP ARTHROPLASTY;  Surgeon: Kerin Salen, MD;  Location: [place];  Service:  Orthopedics;  Laterality: Left;  DEPUY/PINNACLE, SUMMIT STEM, CUP POLY & CERAMIC  . VAGINAL HYSTERECTOMY       Current Outpatient Prescriptions  Medication Sig Dispense Refill  . amLODipine (NORVASC) 10 MG tablet Take 10 mg by mouth daily.     Marland Kitchen aspirin EC 81 MG tablet Take 81 mg by mouth every morning.    Marland Kitchen atorvastatin (LIPITOR) 80 MG tablet Take 40 mg by mouth every evening.    . ergocalciferol (VITAMIN D2) 50000 UNITS capsule Take 50,000 Units by mouth once a week. On friday    . fluticasone (FLONASE) 50 MCG/ACT nasal spray Place 2 sprays into both nostrils daily. 16 g 1  . furosemide (LASIX) 80 MG tablet Take 1 tablet (80 mg total) by mouth daily. 90 tablet 2  . gabapentin (NEURONTIN) 300 MG capsule 2 TABLETS BY MOUTH AT BEDTIME 60 capsule 1  . GLIPIZIDE XL 5 MG 24 hr tablet Take 5 mg by mouth Daily.    . hydrALAZINE (APRESOLINE) 25 MG tablet Take 1 tablet (25 mg total) by mouth 2 (two) times daily. 180 tablet 3  . insulin aspart (NOVOLOG) 100 UNIT/ML injection Inject into the skin as directed. Based on a sliding scale. Inject 3-4 units into the skin daily with breakfast, inject 3-4 units into the skin with lunch. Inject 8 units into the skin daily with dinner.    . insulin glargine (LANTUS) 100 UNIT/ML injection Inject 20 Units into the skin at bedtime.     . IRON PO Take 1 tablet by mouth daily.     . metoprolol tartrate (LOPRESSOR) 25 MG tablet Take 25 mg by mouth 2 (two) times daily.    Vladimir Faster Glycol-Propyl Glycol (SYSTANE) 0.4-0.3 % SOLN Apply 2 drops to eye daily as needed.     No current facility-administered medications for this visit.     Allergies:   Atenolol; Codeine; Lactose intolerance (gi); Lodine [etodolac]; Percocet [oxycodone-acetaminophen]; Sulfa drugs cross reactors; Sulfamethoxazole; Benzonatate; Escitalopram oxalate; Metformin; Oxycodone-acetaminophen; and Pregabalin    Social History:  The patient  reports that she has never smoked. She has never used  smokeless tobacco. She reports that she does not drink alcohol or use drugs.   Family History:  The patient's family history includes Angina in her mother; Coronary artery disease in her unknown relative; Heart attack in her father; Hypertension in her father; Stroke in her mother.    ROS:  Please see the history of present illness.   Otherwise, review of systems are positive for none.   All other systems are reviewed and negative.    PHYSICAL EXAM: VS:  BP 132/70   Pulse 65   Ht 5\' 4"  (1.626 m)   Wt 212 lb 6.4 oz (96.3 kg)   SpO2 94%   BMI 36.46 kg/m  , BMI Body mass index is 36.46 kg/m. GEN: Well nourished, well developed, in no acute distress  HEENT: normal  Neck: no JVD, carotid bruits, or masses Cardiac: RRR; no murmurs, rubs, or gallops,  2 + edema  Respiratory:  clear to auscultation bilaterally, normal work of breathing GI: soft, nontender, nondistended, + BS MS: no deformity or atrophy  Skin: warm and dry, no rash Neuro:  Strength and sensation are intact Psych: euthymic mood, full affect   EKG:  EKG is ordered today. NSR at 63.   LAHB. No changes.    Recent Labs: 10/06/2016: ALT 16 10/08/2016: TSH 0.378 10/09/2016: B Natriuretic Peptide 446.7 10/10/2016: Magnesium 2.8; Platelets 218 10/28/2016: BUN 23; Creatinine, Ser 1.40; Hemoglobin 11.6; Potassium 3.9; Sodium 143    Lipid Panel    Component Value Date/Time   CHOL  04/21/2009 0620    85        ATP III CLASSIFICATION:  <200     mg/dL   Desirable  200-239  mg/dL   Borderline High  >=240    mg/dL   High          TRIG 93 04/21/2009 0620   HDL 27 (L) 04/21/2009 0620   CHOLHDL 3.1 04/21/2009 0620   VLDL 19 04/21/2009 0620   LDLCALC  04/21/2009 0620    39        Total Cholesterol/HDL:CHD Risk Coronary Heart Disease Risk Table                     Men   Women  1/2 Average Risk   3.4   3.3  Average Risk       5.0   4.4  2 X Average Risk   9.6   7.1  3 X Average Risk  23.4   11.0        Use the calculated  Patient Ratio above and the CHD Risk Table to determine the patient's CHD Risk.        ATP III CLASSIFICATION (LDL):  <100     mg/dL   Optimal  100-129  mg/dL   Near or Above                    Optimal  130-159  mg/dL   Borderline  160-189  mg/dL   High  >190     mg/dL   Very High    Wt Readings from Last 3 Encounters:  07/01/17 212 lb 6.4 oz (96.3 kg)  12/03/16 216 lb (98 kg)  11/27/16 217 lb 12.8 oz (98.8 kg)     ASSESSMENT AND PLAN:  1.  Chronic diastolic heart failure, table on current Lasix 80 mg daily  has 1-2 + leg edema Gave her info on the Lounge Dr. Leg rest   I'll see her in 6 month.  2. status post pericardial window for pericardial effusion with pericardial tamponade in 2010  3. diabetes mellitus with diabetic neuropathy  4. essential hypertension:   BP is elevated today . Will add hydralazine 25 mg BID.     Continue same medications. I've advised her to decrease intake of salt and pre-prepared foods. She seems to eat a lot of prepared foods and salty foods. I've advised her to elevate her legs higher than her heart. She typically has not been elevating her legs and off.  Current medicines are reviewed at length with the patient today.  The patient does not have concerns regarding medicines.    Labs/ tests ordered today include:   No orders of the defined types were placed in this encounter.  Mertie Moores, MD  07/01/2017 3:42 PM    [address],  [place], [place]  [address] Pager [phone] Phone: [phone]; Fax: [phone]

## 2017-07-02 ENCOUNTER — Encounter: Payer: Self-pay | Admitting: *Deleted

## 2017-07-03 NOTE — Addendum Note (Signed)
Addended by: Mendel Ryder on: 07/03/2017 08:12 AM   Modules accepted: Orders

## 2017-07-18 DIAGNOSIS — N3946 Mixed incontinence: Secondary | ICD-10-CM | POA: Diagnosis not present

## 2017-07-18 DIAGNOSIS — R311 Benign essential microscopic hematuria: Secondary | ICD-10-CM | POA: Diagnosis not present

## 2017-07-23 ENCOUNTER — Encounter: Payer: Self-pay | Admitting: Internal Medicine

## 2017-07-23 LAB — URINALYSIS W MICROSCOPIC (NOT AT ARMC)
Bilirubin: NEGATIVE
Blood: NEGATIVE
Glucose: NEGATIVE
Ketone: NEGATIVE
Nitrites: NEGATIVE
Protein: NEGATIVE
Specific Gravity: 1.015
Urobilinogen, UA: NORMAL
pH: 5.5

## 2017-07-23 NOTE — Progress Notes (Signed)
Abstracted and sent to scan  

## 2017-08-12 ENCOUNTER — Ambulatory Visit (INDEPENDENT_AMBULATORY_CARE_PROVIDER_SITE_OTHER): Payer: Medicare Other | Admitting: Internal Medicine

## 2017-08-12 ENCOUNTER — Encounter: Payer: Self-pay | Admitting: Internal Medicine

## 2017-08-12 VITALS — BP 138/58 | HR 64 | Temp 98.7°F | Ht 64.0 in | Wt 208.0 lb

## 2017-08-12 DIAGNOSIS — Z794 Long term (current) use of insulin: Secondary | ICD-10-CM

## 2017-08-12 DIAGNOSIS — N183 Chronic kidney disease, stage 3 unspecified: Secondary | ICD-10-CM

## 2017-08-12 DIAGNOSIS — E1142 Type 2 diabetes mellitus with diabetic polyneuropathy: Secondary | ICD-10-CM

## 2017-08-12 DIAGNOSIS — E785 Hyperlipidemia, unspecified: Secondary | ICD-10-CM | POA: Diagnosis not present

## 2017-08-12 DIAGNOSIS — R7301 Impaired fasting glucose: Secondary | ICD-10-CM

## 2017-08-12 DIAGNOSIS — E113393 Type 2 diabetes mellitus with moderate nonproliferative diabetic retinopathy without macular edema, bilateral: Secondary | ICD-10-CM | POA: Diagnosis not present

## 2017-08-12 DIAGNOSIS — I10 Essential (primary) hypertension: Secondary | ICD-10-CM

## 2017-08-12 DIAGNOSIS — Z23 Encounter for immunization: Secondary | ICD-10-CM | POA: Diagnosis not present

## 2017-08-12 MED ORDER — ALPRAZOLAM 0.25 MG PO TABS: 0.2500 mg | ORAL_TABLET | Freq: Every day | ORAL | 0 refills | Status: DC | PRN

## 2017-08-12 NOTE — Progress Notes (Signed)
   Subjective:    Patient ID: [name], female    DOB: November 28, 1930, 83 y.o.   MRN: [id number]  HPI The patient is an 83 YO female coming in for follow up of her diabetes (eye complications, neuropathy which is stable, does take gabapentin at night time, takes glipizide and insulin but denies low sugars, does check mostly, denies changes), and her CKD (stage 3 but stable for some time, BP at goal, checking on diabetes not on ACE-I), and her blood pressure (taking hydralazine, amlodipine, metoprolol, lasix, without side effects, denies headaches, chest pains, SOB). No new concerns but new stress with her husband's advancing dementia.   Review of Systems  Constitutional: Negative.   HENT: Negative.   Eyes: Negative.   Respiratory: Negative for cough, chest tightness and shortness of breath.   Cardiovascular: Negative for chest pain, palpitations and leg swelling.  Gastrointestinal: Negative for abdominal distention, abdominal pain, constipation, diarrhea, nausea and vomiting.  Musculoskeletal: Positive for gait problem. Negative for arthralgias, back pain, neck pain and neck stiffness.  Skin: Negative.   Neurological: Positive for numbness. Negative for facial asymmetry and weakness.  Psychiatric/Behavioral: Positive for dysphoric mood. Negative for decreased concentration. The patient is nervous/anxious.       Objective:   Physical Exam  Constitutional: She is oriented to person, place, and time. She appears well-developed and well-nourished.  Overweight  HENT:  Head: Normocephalic and atraumatic.  Eyes: EOM are normal.  Neck: Normal range of motion.  Cardiovascular: Normal rate and regular rhythm.   Pulmonary/Chest: Effort normal and breath sounds normal. No respiratory distress. She has no wheezes. She has no rales.  Abdominal: Soft. Bowel sounds are normal. She exhibits no distension. There is no tenderness. There is no rebound.  Musculoskeletal: She exhibits no edema.    Neurological: She is alert and oriented to person, place, and time. Coordination abnormal.  Walker for ambulation  Skin: Skin is warm and dry.  Psychiatric: She has a normal mood and affect.  Some distress which is appropriate when talking about her husband's health.    Vitals:   08/12/17 1441  BP: (!) 138/58  Pulse: 64  Temp: 98.7 F (37.1 C)  TempSrc: Oral  SpO2: 99%  Weight: 208 lb (94.3 kg)  Height: 5\' 4"  (1.626 m)      Assessment & Plan:  Flu shot given at visit.

## 2017-08-12 NOTE — Patient Instructions (Signed)
We have sent in the xanax for the stress that you can use as needed 1/2 pill.

## 2017-08-13 ENCOUNTER — Encounter: Payer: Self-pay | Admitting: Internal Medicine

## 2017-08-13 NOTE — Assessment & Plan Note (Signed)
Checking HgA1c, seeing endo for management. Foot exam done today. Reminded about yearly eye exam.

## 2017-08-13 NOTE — Assessment & Plan Note (Signed)
Checking CMP and adjust her hydralazine, metoprolol, lasix, amlodipine as needed. Has had reactions with several blood pressure medications in the past. Complicated by CKD stage 3.

## 2017-08-13 NOTE — Assessment & Plan Note (Signed)
Checking CMP and adjust as needed. Likely from diabetes and hypertension. Checking level of control diabetes.

## 2017-08-13 NOTE — Assessment & Plan Note (Signed)
Stable, but impacts her balance and using walker. Always uses shoes even in the house.

## 2017-08-18 DIAGNOSIS — E1142 Type 2 diabetes mellitus with diabetic polyneuropathy: Secondary | ICD-10-CM | POA: Diagnosis not present

## 2017-08-18 DIAGNOSIS — Z794 Long term (current) use of insulin: Secondary | ICD-10-CM | POA: Diagnosis not present

## 2017-08-18 DIAGNOSIS — E782 Mixed hyperlipidemia: Secondary | ICD-10-CM | POA: Diagnosis not present

## 2017-08-18 LAB — BASIC METABOLIC PANEL
BUN: 28 — AB (ref 4–21)
Creatinine: 1.3 — AB (ref 0.5–1.1)
Glucose: 107
Potassium: 4.5 (ref 3.4–5.3)
Sodium: 142 (ref 137–147)

## 2017-08-18 LAB — HEPATIC FUNCTION PANEL
ALT: 14 (ref 7–35)
AST: 14 (ref 13–35)
Alkaline Phosphatase: 90 (ref 25–125)
Bilirubin, Total: 0.4

## 2017-08-18 LAB — LIPID PANEL
Cholesterol: 148 (ref 0–200)
HDL: 25 — AB (ref 35–70)
LDL Cholesterol: 73
Triglycerides: 238 — AB (ref 40–160)

## 2017-08-21 DIAGNOSIS — D631 Anemia in chronic kidney disease: Secondary | ICD-10-CM | POA: Diagnosis not present

## 2017-08-21 DIAGNOSIS — I129 Hypertensive chronic kidney disease with stage 1 through stage 4 chronic kidney disease, or unspecified chronic kidney disease: Secondary | ICD-10-CM | POA: Diagnosis not present

## 2017-08-21 DIAGNOSIS — E1142 Type 2 diabetes mellitus with diabetic polyneuropathy: Secondary | ICD-10-CM | POA: Diagnosis not present

## 2017-08-21 DIAGNOSIS — E559 Vitamin D deficiency, unspecified: Secondary | ICD-10-CM | POA: Diagnosis not present

## 2017-08-21 DIAGNOSIS — E782 Mixed hyperlipidemia: Secondary | ICD-10-CM | POA: Diagnosis not present

## 2017-08-21 DIAGNOSIS — Z794 Long term (current) use of insulin: Secondary | ICD-10-CM | POA: Diagnosis not present

## 2017-08-21 DIAGNOSIS — N182 Chronic kidney disease, stage 2 (mild): Secondary | ICD-10-CM | POA: Diagnosis not present

## 2017-08-21 DIAGNOSIS — E113393 Type 2 diabetes mellitus with moderate nonproliferative diabetic retinopathy without macular edema, bilateral: Secondary | ICD-10-CM | POA: Diagnosis not present

## 2017-08-22 ENCOUNTER — Encounter: Payer: Self-pay | Admitting: Internal Medicine

## 2017-08-28 ENCOUNTER — Other Ambulatory Visit: Payer: Self-pay | Admitting: Internal Medicine

## 2017-08-28 DIAGNOSIS — E113393 Type 2 diabetes mellitus with moderate nonproliferative diabetic retinopathy without macular edema, bilateral: Secondary | ICD-10-CM

## 2017-08-28 DIAGNOSIS — Z794 Long term (current) use of insulin: Secondary | ICD-10-CM

## 2017-11-04 LAB — BASIC METABOLIC PANEL: Glucose: 132

## 2017-11-04 LAB — HEMOGLOBIN A1C: Hemoglobin A1C: 7.1

## 2017-11-04 LAB — LIPID PANEL
Cholesterol: 140 (ref 0–200)
HDL: 26 — AB (ref 35–70)
LDL Cholesterol: 75
Triglycerides: 214 — AB (ref 40–160)

## 2017-11-04 LAB — CBC AND DIFFERENTIAL: Hemoglobin: 12.2 (ref 12.0–16.0)

## 2017-11-11 ENCOUNTER — Encounter: Payer: Self-pay | Admitting: Internal Medicine

## 2017-11-11 ENCOUNTER — Ambulatory Visit (INDEPENDENT_AMBULATORY_CARE_PROVIDER_SITE_OTHER): Payer: Medicare Other | Admitting: Internal Medicine

## 2017-11-11 ENCOUNTER — Ambulatory Visit: Payer: Medicare Other | Admitting: Internal Medicine

## 2017-11-11 DIAGNOSIS — R059 Cough, unspecified: Secondary | ICD-10-CM

## 2017-11-11 DIAGNOSIS — R05 Cough: Secondary | ICD-10-CM

## 2017-11-11 MED ORDER — DOXYCYCLINE HYCLATE 100 MG PO TABS: 100.0000 mg | ORAL_TABLET | Freq: Two times a day (BID) | ORAL | 0 refills | Status: DC

## 2017-11-11 NOTE — Assessment & Plan Note (Signed)
Given lack of resolution rx for doxycycline. She is using robitussin for cough at home and will continue given allergy to codeine and tessalon.

## 2017-11-11 NOTE — Patient Instructions (Signed)
We have sent in doxycycline for the lungs. Take 1 pill twice a day for 1 week.

## 2017-11-11 NOTE — Progress Notes (Signed)
   Subjective:    Patient ID: [name], female    DOB: Mar 26, 1931, 84 y.o.   MRN: [id number]  HPI The patient is an 84 YO female coming in for cough going on for about 8-9 days. Overall is mildly worsening. Some wheezing as well and some SOB. She denies major congestion or sinus problems. No ear pain or sinus pressure. No headaches. Denies muscle aches or fevers but some chills. She is using robitussin and flonase at home with some relief. Robitussin helps her to sleep. Exposed to sick contacts.   Review of Systems  Constitutional: Positive for activity change, appetite change and chills. Negative for fatigue, fever and unexpected weight change.  HENT: Positive for congestion. Negative for ear discharge, ear pain, postnasal drip, rhinorrhea, sinus pressure, sinus pain, sneezing, sore throat, tinnitus, trouble swallowing and voice change.   Eyes: Negative.   Respiratory: Positive for cough, shortness of breath and wheezing. Negative for chest tightness.   Cardiovascular: Negative.   Gastrointestinal: Negative.   Neurological: Negative.       Objective:   Physical Exam  Constitutional: She is oriented to person, place, and time. She appears well-developed and well-nourished.  HENT:  Head: Normocephalic and atraumatic.  Oropharynx with redness and clear drainage, TMs normal bilaterally  Eyes: EOM are normal.  Neck: Normal range of motion. No thyromegaly present.  Cardiovascular: Normal rate and regular rhythm.  Pulmonary/Chest: Effort normal. No respiratory distress. She has wheezes. She has no rales.  Minimal wheezing and rhonchi which partially clear with cough.   Abdominal: Soft.  Lymphadenopathy:    She has no cervical adenopathy.  Neurological: She is alert and oriented to person, place, and time.  Skin: Skin is warm and dry.   Vitals:   11/11/17 1555  BP: 124/70  Pulse: 72  Temp: 98.4 F (36.9 C)  TempSrc: Oral  SpO2: 96%  Weight: 200 lb (90.7 kg)  Height: 5\' 4"  (1.626  m)      Assessment & Plan:

## 2017-11-15 IMAGING — CR DG CHEST 2V
3 series · 3 of 3 positions shown · non-contrast
Comparison: Yesterday.

CLINICAL DATA: Acute respiratory failure.  Shortness of breath.

EXAM:
CHEST  2 VIEW

[chest lat (1 of 2)]
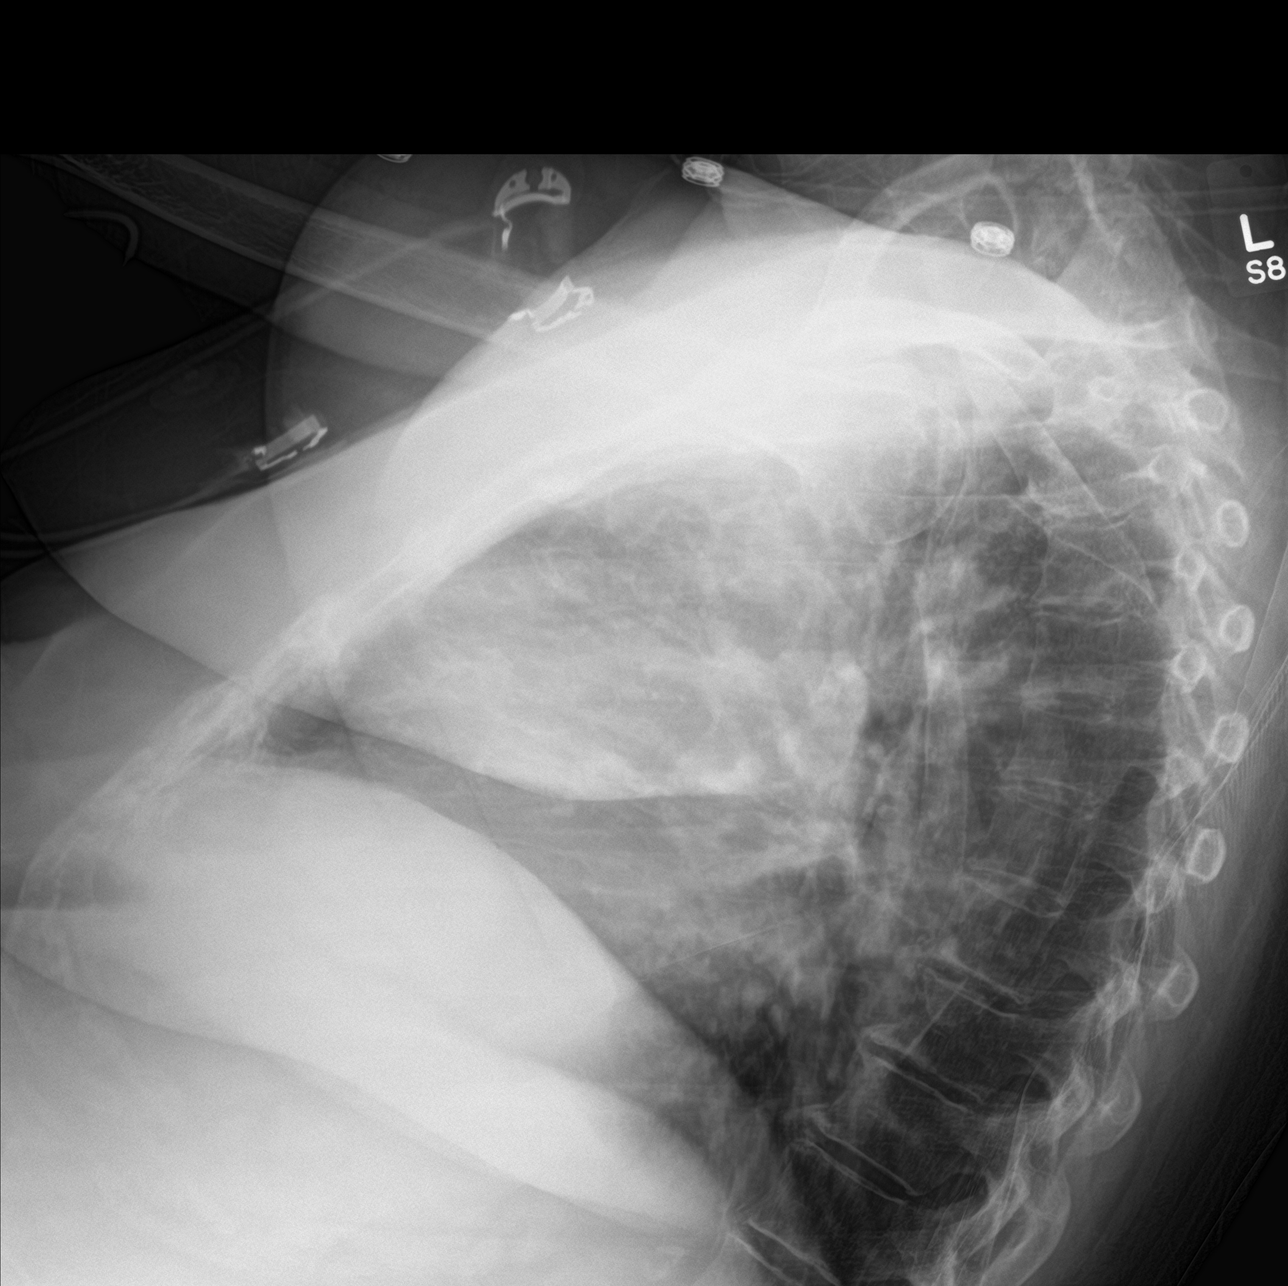

[chest ap]
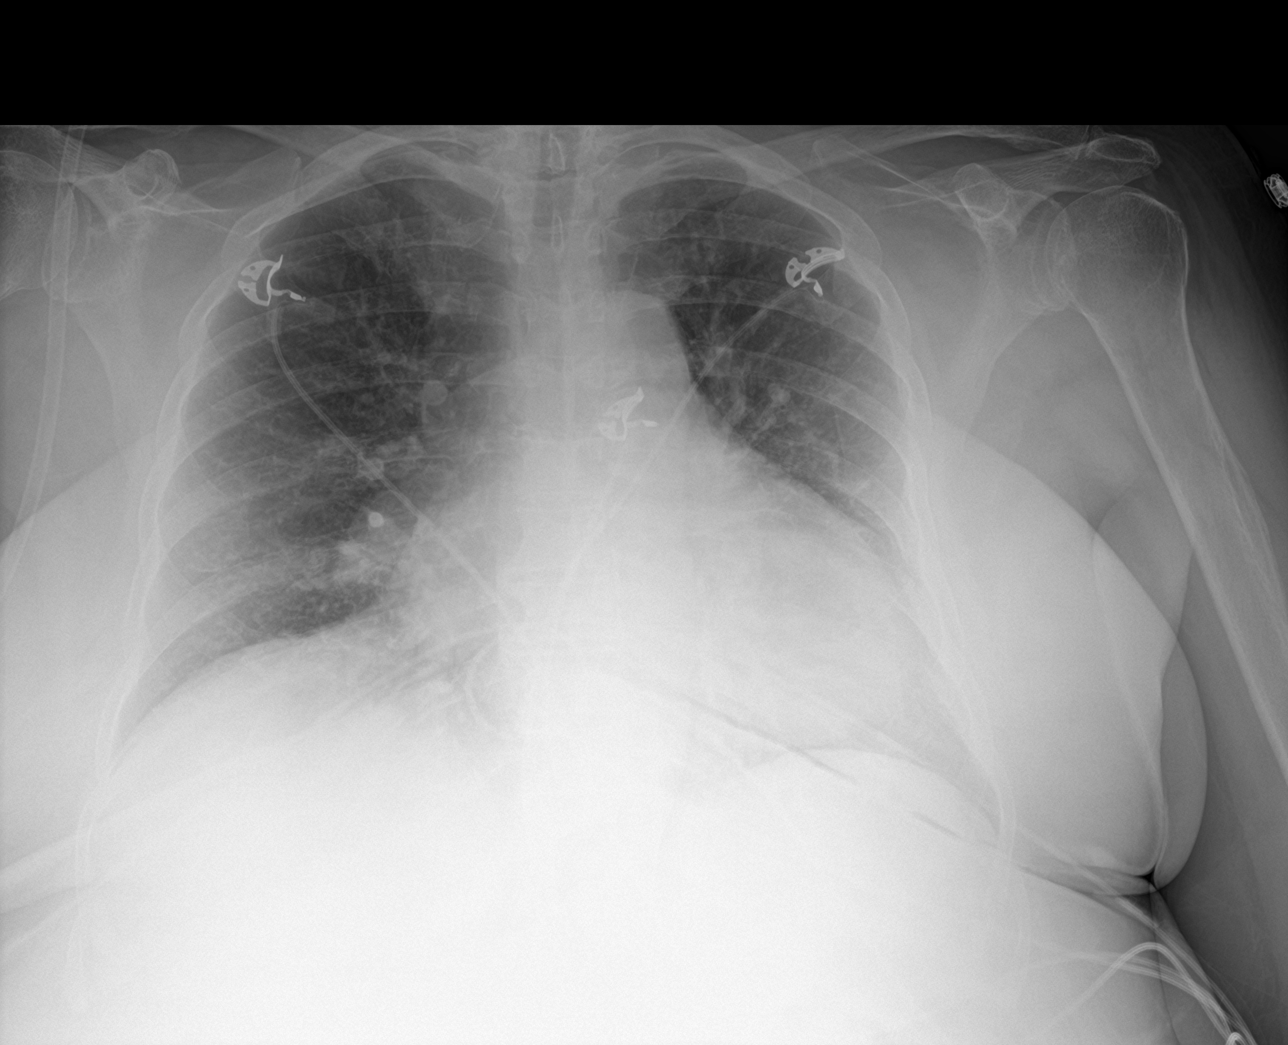

[chest lat (2 of 2)]
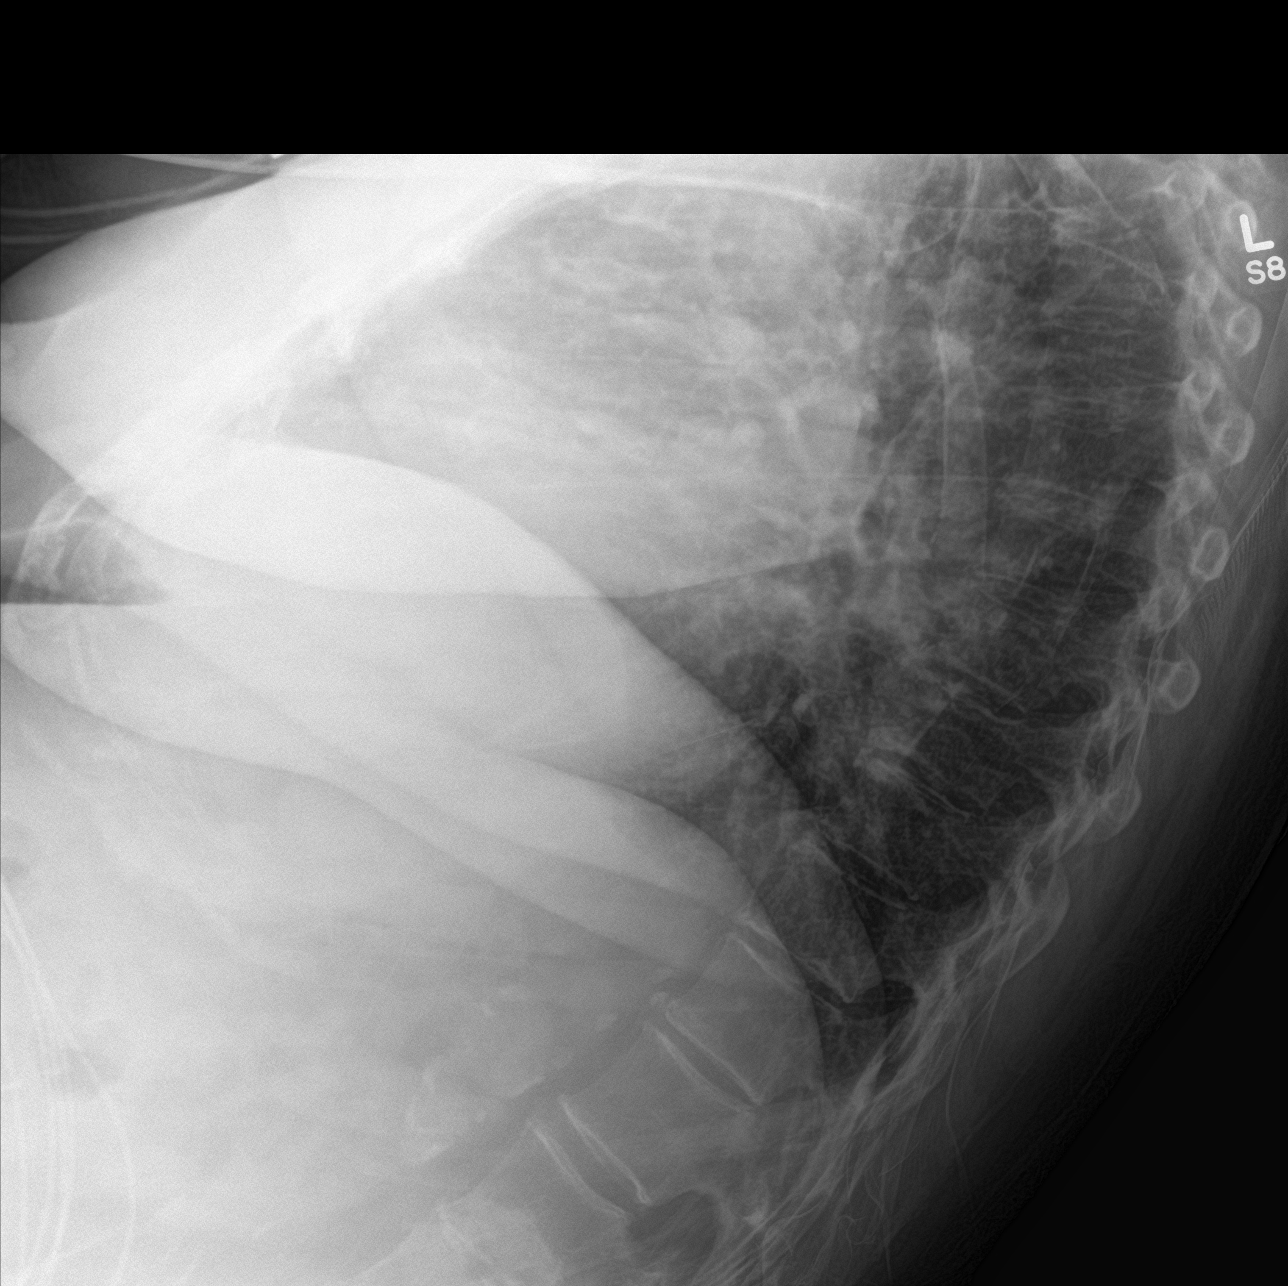

[3 of 3 positions shown; findings below may reference images not displayed]

FINDINGS: Interval decreased inspiration and interval enlargement of the
cardiac silhouette. Mild increase in prominence of the pulmonary
vasculature. Stable mild prominence of the interstitial markings.
Thoracic spine degenerative changes. Aortic calcifications.
IMPRESSION: 1. Interval mild cardiomegaly, accentuated by a decreased
inspiration and the AP technique of the frontal view.
2. Stable mild chronic interstitial lung disease.

## 2017-11-17 ENCOUNTER — Encounter: Payer: Self-pay | Admitting: Cardiovascular Disease

## 2017-11-17 DIAGNOSIS — E113393 Type 2 diabetes mellitus with moderate nonproliferative diabetic retinopathy without macular edema, bilateral: Secondary | ICD-10-CM | POA: Diagnosis not present

## 2017-11-17 DIAGNOSIS — Z794 Long term (current) use of insulin: Secondary | ICD-10-CM | POA: Diagnosis not present

## 2017-11-17 DIAGNOSIS — N182 Chronic kidney disease, stage 2 (mild): Secondary | ICD-10-CM | POA: Diagnosis not present

## 2017-11-17 DIAGNOSIS — D631 Anemia in chronic kidney disease: Secondary | ICD-10-CM | POA: Diagnosis not present

## 2017-11-17 DIAGNOSIS — E782 Mixed hyperlipidemia: Secondary | ICD-10-CM | POA: Diagnosis not present

## 2017-11-17 LAB — HEPATIC FUNCTION PANEL
ALT: 14 (ref 7–35)
AST: 13 (ref 13–35)
Alkaline Phosphatase: 83 (ref 25–125)
Bilirubin, Total: 0.4

## 2017-11-17 LAB — BASIC METABOLIC PANEL
BUN: 35 — AB (ref 4–21)
Creatinine: 1.3 — AB (ref 0.5–1.1)
Potassium: 4.3 (ref 3.4–5.3)
Sodium: 142 (ref 137–147)

## 2017-11-17 LAB — CBC AND DIFFERENTIAL
HCT: 38 (ref 36–46)
Platelets: 230 (ref 150–399)
WBC: 9.1

## 2017-11-17 IMAGING — CR DG CHEST 2V
2 series · 2 of 2 positions shown · non-contrast
Comparison: 10/06/2016

CLINICAL DATA: Shortness of breath, wheezing

EXAM:
CHEST  2 VIEW

[chest lat]
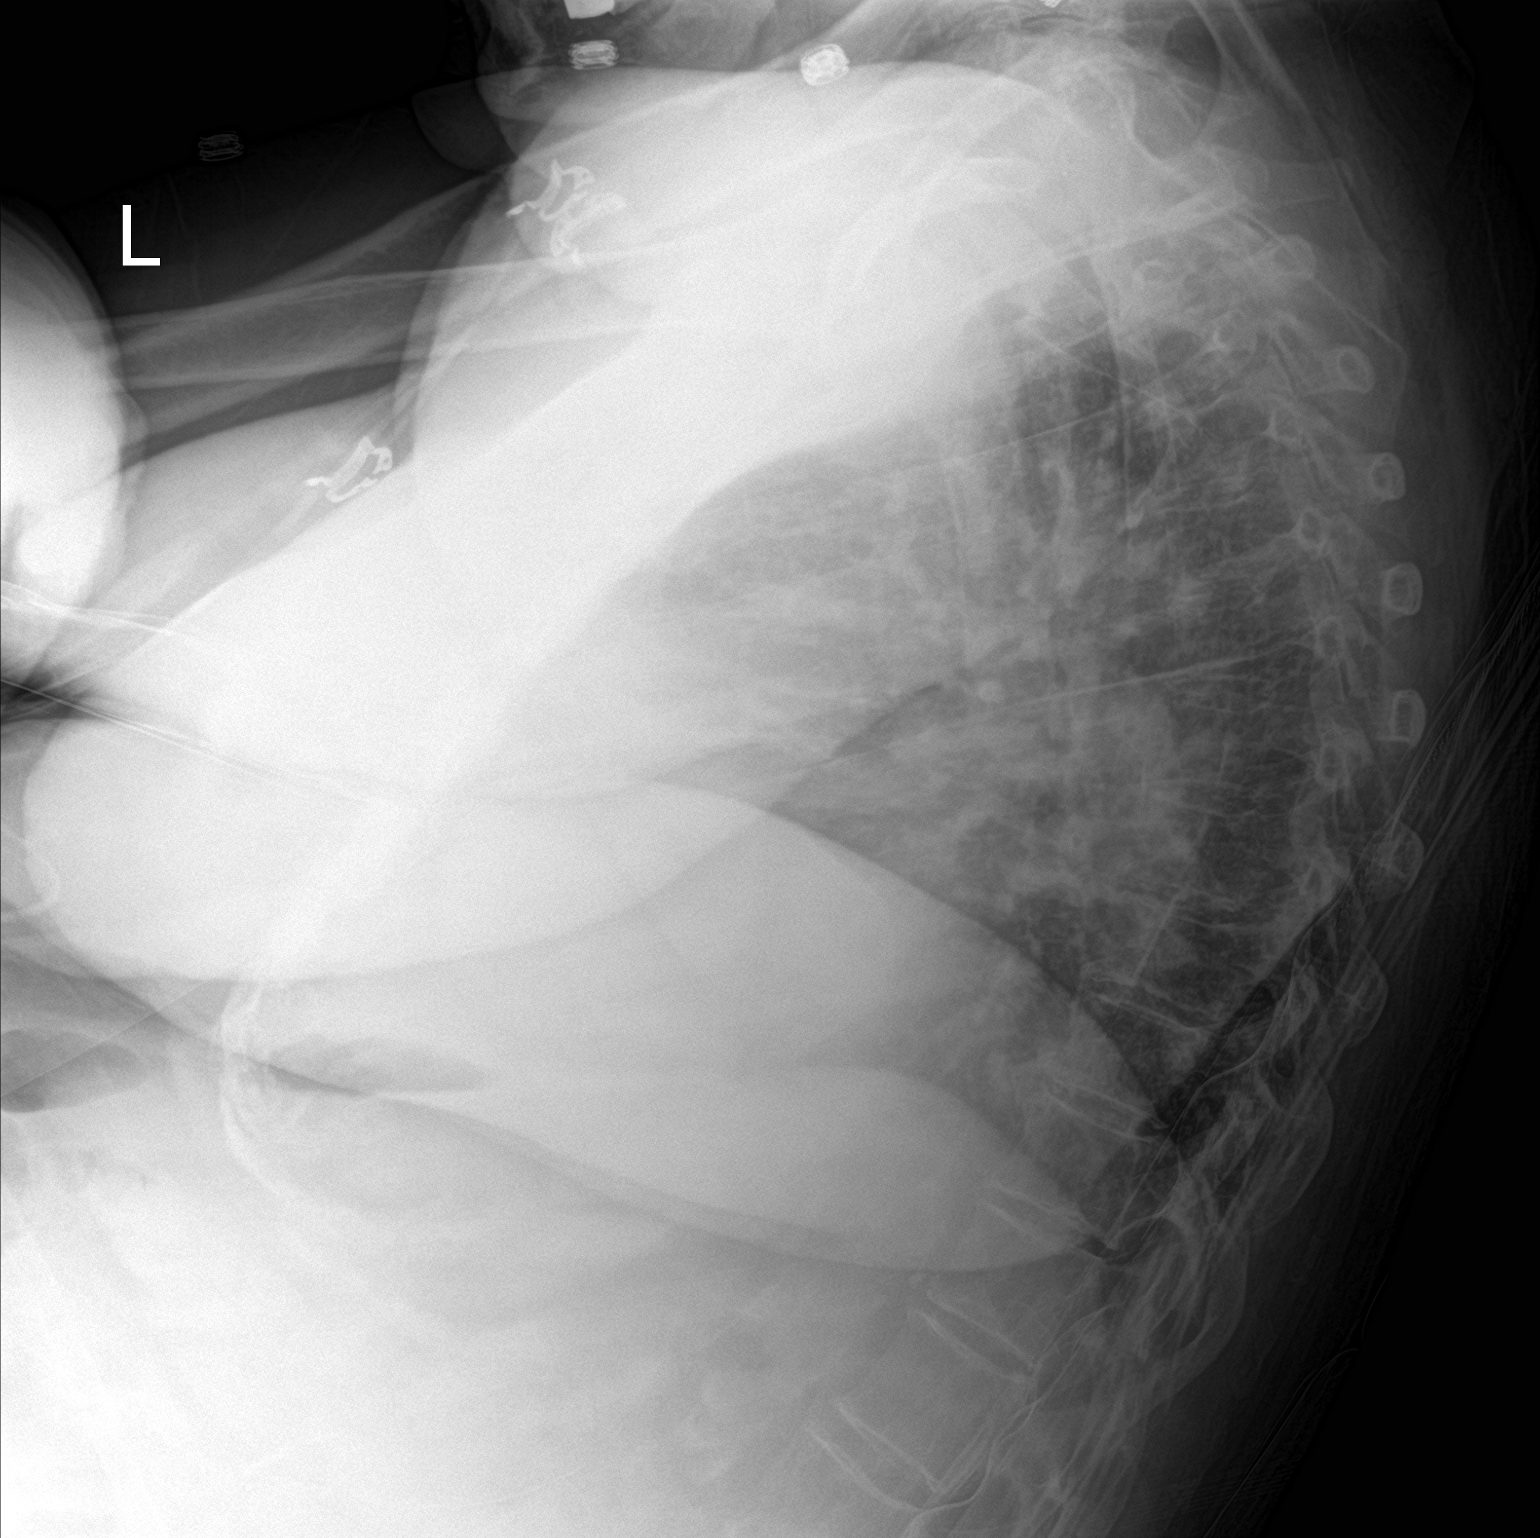

[chest ap]
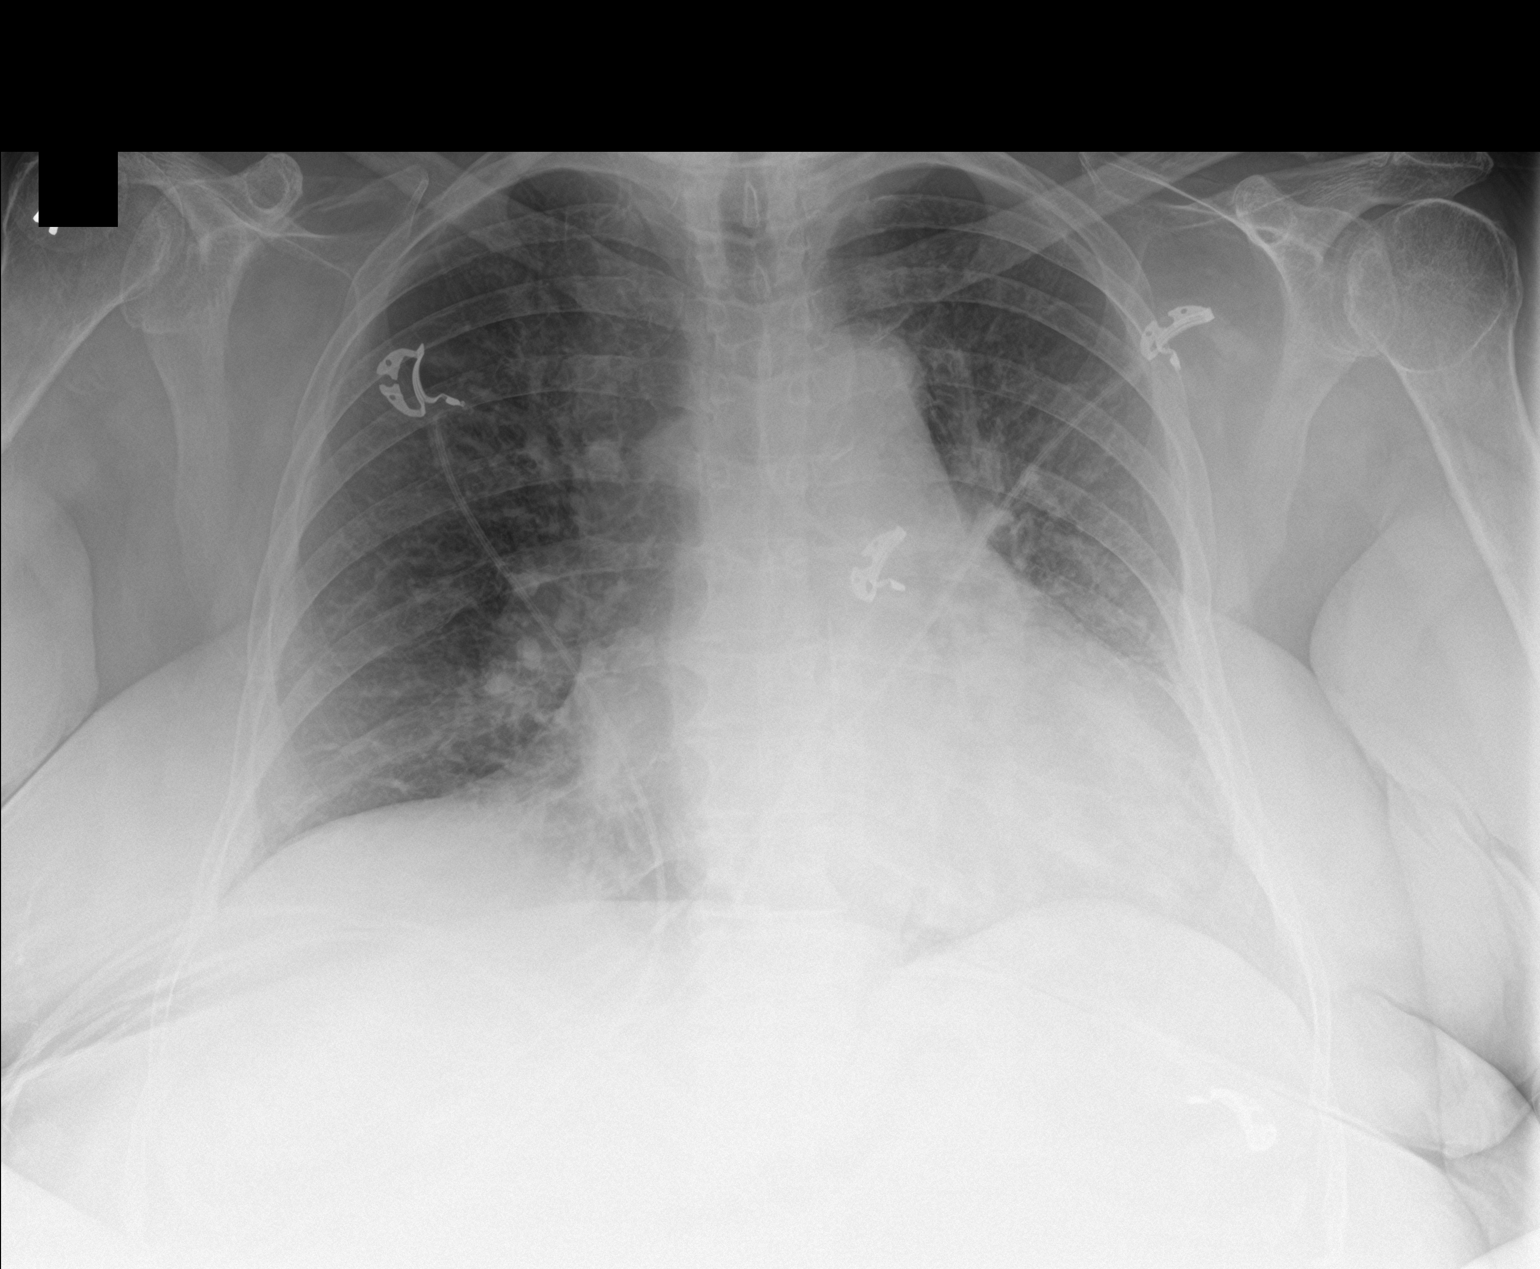

[2 of 2 positions shown; findings below may reference images not displayed]

FINDINGS: Cardiomegaly again noted. Central vascular congestion without
convincing pulmonary edema. Suboptimal exam due to patient's large
body habitus. Mild basilar atelectasis. No segmental infiltrate.
Mild degenerative changes thoracic spine.
IMPRESSION: Central vascular congestion without convincing pulmonary edema. Mild
basilar atelectasis. Cardiomegaly. No segmental infiltrate.

## 2017-11-18 IMAGING — CR DG CHEST 1V PORT
1 series · 1 of 1 positions shown · non-contrast
Comparison: Yesterday

CLINICAL DATA: Respiratory failure

EXAM:
PORTABLE CHEST 1 VIEW

[AP]
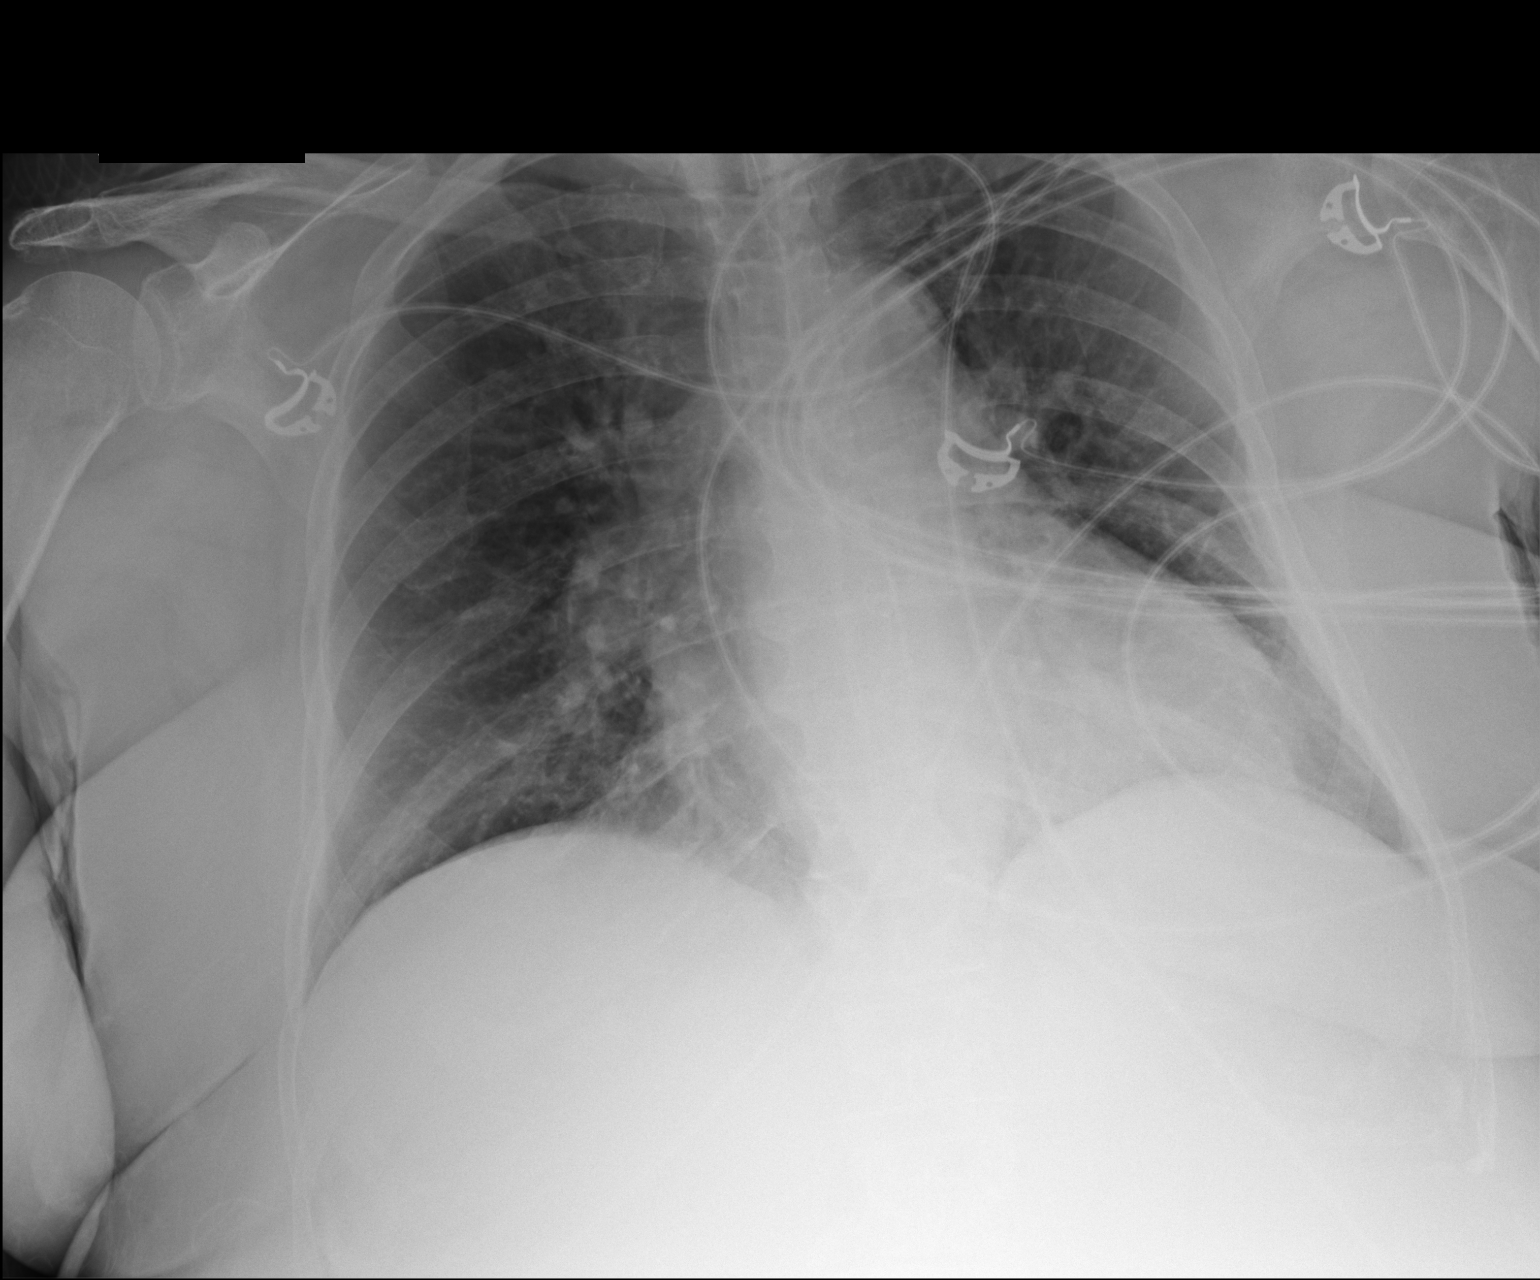

[1 of 1 positions shown; findings below may reference images not displayed]

FINDINGS: EKG leads create artifact over the chest.

Stable mild cardiomegaly, accentuated by technique. Low volume chest
with interstitial crowding. There is no edema, consolidation,
effusion, or pneumothorax. No acute osseous finding.
IMPRESSION: Stable from yesterday. Low volume chest without consolidation or
edema.

## 2017-11-20 DIAGNOSIS — D631 Anemia in chronic kidney disease: Secondary | ICD-10-CM | POA: Diagnosis not present

## 2017-11-20 DIAGNOSIS — E1142 Type 2 diabetes mellitus with diabetic polyneuropathy: Secondary | ICD-10-CM | POA: Diagnosis not present

## 2017-11-20 DIAGNOSIS — I129 Hypertensive chronic kidney disease with stage 1 through stage 4 chronic kidney disease, or unspecified chronic kidney disease: Secondary | ICD-10-CM | POA: Diagnosis not present

## 2017-11-20 DIAGNOSIS — M791 Myalgia, unspecified site: Secondary | ICD-10-CM | POA: Diagnosis not present

## 2017-11-20 DIAGNOSIS — Z96642 Presence of left artificial hip joint: Secondary | ICD-10-CM | POA: Diagnosis not present

## 2017-11-20 DIAGNOSIS — Z794 Long term (current) use of insulin: Secondary | ICD-10-CM | POA: Diagnosis not present

## 2017-11-20 DIAGNOSIS — N393 Stress incontinence (female) (male): Secondary | ICD-10-CM | POA: Diagnosis not present

## 2017-11-20 DIAGNOSIS — E559 Vitamin D deficiency, unspecified: Secondary | ICD-10-CM | POA: Diagnosis not present

## 2017-11-20 DIAGNOSIS — E782 Mixed hyperlipidemia: Secondary | ICD-10-CM | POA: Diagnosis not present

## 2017-11-20 DIAGNOSIS — Z87440 Personal history of urinary (tract) infections: Secondary | ICD-10-CM | POA: Diagnosis not present

## 2017-11-20 DIAGNOSIS — E113393 Type 2 diabetes mellitus with moderate nonproliferative diabetic retinopathy without macular edema, bilateral: Secondary | ICD-10-CM | POA: Diagnosis not present

## 2017-11-20 DIAGNOSIS — N182 Chronic kidney disease, stage 2 (mild): Secondary | ICD-10-CM | POA: Diagnosis not present

## 2017-11-21 ENCOUNTER — Encounter: Payer: Self-pay | Admitting: Internal Medicine

## 2017-11-24 ENCOUNTER — Other Ambulatory Visit: Payer: Self-pay

## 2017-11-24 MED ORDER — HYDRALAZINE HCL 25 MG PO TABS: 25.0000 mg | ORAL_TABLET | Freq: Two times a day (BID) | ORAL | 3 refills | Status: DC

## 2017-11-27 MED ORDER — HYDRALAZINE HCL 25 MG PO TABS: 25.0000 mg | ORAL_TABLET | Freq: Two times a day (BID) | ORAL | 1 refills | Status: DC

## 2017-11-27 NOTE — Addendum Note (Signed)
Addended by: Juventino Slovak on: 11/27/2017 12:45 PM   Modules accepted: Orders

## 2017-12-08 ENCOUNTER — Other Ambulatory Visit: Payer: Self-pay | Admitting: Cardiovascular Disease

## 2017-12-08 MED ORDER — FUROSEMIDE 80 MG PO TABS: 80.0000 mg | ORAL_TABLET | Freq: Every day | ORAL | 1 refills | Status: DC

## 2017-12-09 DIAGNOSIS — H35372 Puckering of macula, left eye: Secondary | ICD-10-CM | POA: Diagnosis not present

## 2017-12-09 DIAGNOSIS — D3131 Benign neoplasm of right choroid: Secondary | ICD-10-CM | POA: Diagnosis not present

## 2017-12-09 DIAGNOSIS — H35033 Hypertensive retinopathy, bilateral: Secondary | ICD-10-CM | POA: Diagnosis not present

## 2017-12-09 DIAGNOSIS — Z961 Presence of intraocular lens: Secondary | ICD-10-CM | POA: Diagnosis not present

## 2018-02-05 ENCOUNTER — Ambulatory Visit (INDEPENDENT_AMBULATORY_CARE_PROVIDER_SITE_OTHER): Payer: Medicare Other | Admitting: Cardiovascular Disease

## 2018-02-05 ENCOUNTER — Encounter: Payer: Self-pay | Admitting: Cardiovascular Disease

## 2018-02-05 VITALS — BP 126/52 | HR 62 | Ht 64.0 in | Wt 201.0 lb

## 2018-02-05 DIAGNOSIS — I5032 Chronic diastolic (congestive) heart failure: Secondary | ICD-10-CM

## 2018-02-05 DIAGNOSIS — I10 Essential (primary) hypertension: Secondary | ICD-10-CM

## 2018-02-05 NOTE — Progress Notes (Signed)
Cardiology Office Note   Date:  02/05/2018   ID:  [name], DOB [birth year], MRN [id number]  PCP:  Hoyt Koch, MD  Cardiologist: Darlin Coco MD, now [place]  Chief Complaint  Patient presents with  . Congestive Heart Failure   Problem list 1. Chronic diastolic congestive heart failure 2. History of pericardial effusion-status post pericardial window 3.  Aortic stenosis 4. Diabetes mellitus 5. Coronary artery disease 6. Essential hypertension 7. Hyperlipidemia   Notes from [place] - Feb. 2017:  [name] is a 84 y.o. female who presents for a six-month follow-up visit   She has a past history of diastolic congestive heart failure. Her last echocardiogram on 02/10/12 showed an ejection fraction of 60% with normal systolic function and grade 1 diastolic dysfunction. She has a past history of pericardial effusion with pericardial tamponade and in 2010 and underwen a pericardial window with good results. she has a history of mild aortic stenosis. Since we last saw her she underwent left hip replacement by Dr. Mayer Camel and has done well.  Her last echocardiogram was 02/10/12 and showed an ejection fraction of 60%. Since last visit she has been feeling well. Since last visit her husband has been diagnosed with early Alzheimer's but fortunately he seems to be responding nicely to Aricept.Her last visit we gave her a trial off gabapentin for peripheral neuropathy.Initially she did not think that it helped but now she thinks that it has helped and she would like to try a stronger dose.  We will increase the dose to 600 mg each evening. She reports that she is only taking 80 mg of Lasix daily and this has been sufficient to keep her edema at a minimum. He has not been having any recent chest pain.  She is not having any increase in peripheral edema on the lower dose of Lasix.  Her breathing is improved.  Oct. 18, 2017: Still some dyspnea. Does not get any regular   exercise Lots of back and leg pain   Jan. 24, 2018:  Was admitted to the hospital in Dec for shortness of breath .  Was found to have Grade 2 diastolic dysfunction  Was found to have CKD - Diovan was stopped.  BP has been elevated since that time   Aug. 28, 2018:  [name] is seen today .    She does have DOE.  Does not exercise.  Has chronic diastolic CHF  February 06, 6236:  [name]  is seen today for follow-up of her chronic diastolic congestive heart failure and hypertension. Feeling well  Husband died 2 weeks ago .  [name]) ,  Had been in a SNF for 6 months  Walks with walker.  Has lost some weight .  Wt. is 201 lbs today    Past Medical History:  Diagnosis Date  . Anasarca 04/2009   found to be secondary to pericardial effusion with tamponade/pericardial window done  . Chronic diastolic CHF (congestive heart failure) ([place])   . CKD (chronic kidney disease), stage III ([place])   . Coronary artery disease    a. mild-mod CAD 2004.  . Diabetes mellitus    insulin dependent  . Herpes labialis    Healing herpes labialis  . Hypercholesterolemia   . Hypertension   . Lumbar radiculopathy   . Lumbar scoliosis   . Morbid obesity ([place])   . Pericardial effusion 04/2009   a. 2010 with tamponade s/p window.  Marland Kitchen PONV (postoperative nausea  and vomiting)    has not had any problems since 2001  . Spondylolisthesis   . Spondylosis     Past Surgical History:  Procedure Laterality Date  . BACK SURGERY     microdiskectomy L3-4 on L/partial facetectomy 3-4 on L/removal of synovial cyst on left L3-4  . BREAST BIOPSY     left  . CARDIAC CATHETERIZATION  04/25/2003   L heart cath w/coronary angiography/R femoral artery/ EF 65%/no mitral regurgitation/ angiography L main coronary artery smooth & normal  . EYE SURGERY     ophthalmoscopy/peritomy adjacent to the limbus from the 8 to 2:30 position superiorly  . lumbar laminectomy    . SUBXYPHOID PERICARDIAL WINDOW  04/2009   for  pericardial effusion w/pericardial tamponade/sac drained w/20-French Baard drain/transesophageal echocardiogram confimed complete evacution of pericardial fluid  . TOTAL HIP ARTHROPLASTY  03/06/2012   Procedure: TOTAL HIP ARTHROPLASTY;  Surgeon: Kerin Salen, MD;  Location: [facility];  Service: Orthopedics;  Laterality: Left;  DEPUY/PINNACLE, SUMMIT STEM, CUP POLY & CERAMIC  . VAGINAL HYSTERECTOMY       Current Outpatient Medications  Medication Sig Dispense Refill  . ALPRAZolam (XANAX) 0.25 MG tablet Take 1 tablet (0.25 mg total) by mouth daily as needed for anxiety. 20 tablet 0  . amLODipine (NORVASC) 10 MG tablet Take 10 mg by mouth daily.     Marland Kitchen aspirin EC 81 MG tablet Take 81 mg by mouth every morning.    Marland Kitchen atorvastatin (LIPITOR) 80 MG tablet Take 40 mg by mouth every evening.    Marland Kitchen doxycycline (VIBRA-TABS) 100 MG tablet Take 1 tablet (100 mg total) by mouth 2 (two) times daily. 14 tablet 0  . ergocalciferol (VITAMIN D2) 50000 UNITS capsule Take 50,000 Units by mouth once a week. On friday    . fluticasone (FLONASE) 50 MCG/ACT nasal spray Place 2 sprays into both nostrils daily. 16 g 1  . furosemide (LASIX) 80 MG tablet Take 1 tablet (80 mg total) by mouth daily. 90 tablet 1  . gabapentin (NEURONTIN) 300 MG capsule 2 TABLETS BY MOUTH AT BEDTIME 60 capsule 1  . GLIPIZIDE XL 5 MG 24 hr tablet Take 5 mg by mouth Daily.    . hydrALAZINE (APRESOLINE) 25 MG tablet Take 1 tablet (25 mg total) by mouth 2 (two) times daily. 180 tablet 1  . insulin aspart (NOVOLOG) 100 UNIT/ML injection Inject into the skin as directed. Based on a sliding scale. Inject 3-4 units into the skin daily with breakfast, inject 3-4 units into the skin with lunch. Inject 8 units into the skin daily with dinner.    . insulin glargine (LANTUS) 100 UNIT/ML injection Inject 20 Units into the skin at bedtime.     . IRON PO Take 1 tablet by mouth daily.     . metoprolol tartrate (LOPRESSOR) 25 MG tablet Take 25 mg by mouth 2 (two)  times daily.    Vladimir Faster Glycol-Propyl Glycol (SYSTANE) 0.4-0.3 % SOLN Apply 2 drops to eye daily as needed.     No current facility-administered medications for this visit.     Allergies:   Atenolol; Codeine; Lactose intolerance (gi); Lodine [etodolac]; Percocet [oxycodone-acetaminophen]; Sulfa drugs cross reactors; Sulfamethoxazole; Benzonatate; Escitalopram oxalate; Metformin; Oxycodone-acetaminophen; and Pregabalin    Social History:  The patient  reports that she has never smoked. She has never used smokeless tobacco. She reports that she does not drink alcohol or use drugs.   Family History:  The patient's family history includes Angina in her  mother; Coronary artery disease in her unknown relative; Heart attack in her father; Hypertension in her father; Stroke in her mother.    ROS:  Please see the history of present illness.   Otherwise, review of systems are positive for none.   All other systems are reviewed and negative.    Physical Exam: Blood pressure (!) 126/52, pulse 62, height 5\' 4"  (1.626 m), weight 201 lb (91.2 kg), SpO2 95 %.  GEN:  Well nourished, well developed in no acute distress HEENT: Normal NECK: No JVD; No carotid bruits LYMPHATICS: No lymphadenopathy CARDIAC: RR, no murmurs, rubs, gallops RESPIRATORY:  Clear to auscultation without rales, wheezing or rhonchi  ABDOMEN: Soft, non-tender, non-distended MUSCULOSKELETAL:  No edema; No deformity  SKIN: Warm and dry NEUROLOGIC:  Alert and oriented x 3    EKG:     Recent Labs: 11/04/2017: Hemoglobin 12.2 11/17/2017: ALT 14; BUN 35; Creatinine 1.3; Platelets 230; Potassium 4.3; Sodium 142    Lipid Panel    Component Value Date/Time   CHOL 140 11/04/2017   TRIG 214 (A) 11/04/2017   HDL 26 (A) 11/04/2017   CHOLHDL 3.1 04/21/2009 0620   VLDL 19 04/21/2009 0620   LDLCALC 75 11/04/2017    Wt Readings from Last 3 Encounters:  02/05/18 201 lb (91.2 kg)  11/11/17 200 lb (90.7 kg)  08/12/17 208 lb  (94.3 kg)     ASSESSMENT AND PLAN:  1.  Chronic diastolic heart failure, table on current Lasix 80 mg daily  overall stable Legs are swollen Gave info on Lounge Dr. Leg rest. .  Advised that with continued weight loss that we would probably be able to decrease the dose of amlodipine which would help the leg swelling.   2.  History of pericarditis: She is status post pericardial window.  3. diabetes mellitus with diabetic neuropathy  4. essential hypertension:   Blood pressure is well controlled. Advised contiued weight   Continue same medications. I've advised her to decrease intake of salt and pre-prepared foods. She seems to eat a lot of prepared foods and salty foods. I've advised her to elevate her legs higher than her heart. She typically has not been elevating her legs and off.  Current medicines are reviewed at length with the patient today.  The patient does not have concerns regarding medicines.    Labs/ tests ordered today include:   No orders of the defined types were placed in this encounter.    Mertie Moores, MD  02/05/2018 4:35 PM    [address],  [place],   [address] Pager [phone] Phone: [phone]; Fax: [phone]

## 2018-02-05 NOTE — Patient Instructions (Addendum)
Medication Instructions:  Your physician recommends that you continue on your current medications as directed. Please refer to the Current Medication list given to you today.   Labwork: Your physician recommends that you return for lab work in: 6 months at your next office visit for basic metabolic panel   Testing/Procedures: None Ordered   Follow-Up: Your physician wants you to follow-up in: 6 months with Dr. Acie Fredrickson.  You will receive a reminder letter in the mail two months in advance. If you don't receive a letter, please call our office to schedule the follow-up appointment.   If you need a refill on your cardiac medications before your next appointment, please call your pharmacy.   Thank you for choosing [facility]! Christen Bame, RN [phone]     For your  leg edema you  should do  the following 1. Leg elevation - I recommend the Lounge Dr. Leg rest.  See below for details  2. Salt restriction  -  Use potassium chloride instead of regular salt as a salt substitute. 3. Walk regularly 4. Compression hose - [facility] 5. Weight loss    Available on [email or web address] Or  Go to [email or web address]

## 2018-02-16 DIAGNOSIS — E782 Mixed hyperlipidemia: Secondary | ICD-10-CM | POA: Diagnosis not present

## 2018-02-16 DIAGNOSIS — E559 Vitamin D deficiency, unspecified: Secondary | ICD-10-CM | POA: Diagnosis not present

## 2018-02-16 DIAGNOSIS — Z794 Long term (current) use of insulin: Secondary | ICD-10-CM | POA: Diagnosis not present

## 2018-02-16 DIAGNOSIS — E113393 Type 2 diabetes mellitus with moderate nonproliferative diabetic retinopathy without macular edema, bilateral: Secondary | ICD-10-CM | POA: Diagnosis not present

## 2018-02-16 DIAGNOSIS — D631 Anemia in chronic kidney disease: Secondary | ICD-10-CM | POA: Diagnosis not present

## 2018-02-16 DIAGNOSIS — N182 Chronic kidney disease, stage 2 (mild): Secondary | ICD-10-CM | POA: Diagnosis not present

## 2018-02-19 DIAGNOSIS — E113393 Type 2 diabetes mellitus with moderate nonproliferative diabetic retinopathy without macular edema, bilateral: Secondary | ICD-10-CM | POA: Diagnosis not present

## 2018-02-19 DIAGNOSIS — E663 Overweight: Secondary | ICD-10-CM | POA: Diagnosis not present

## 2018-02-19 DIAGNOSIS — Z794 Long term (current) use of insulin: Secondary | ICD-10-CM | POA: Diagnosis not present

## 2018-02-19 DIAGNOSIS — Z7982 Long term (current) use of aspirin: Secondary | ICD-10-CM | POA: Diagnosis not present

## 2018-02-19 DIAGNOSIS — D631 Anemia in chronic kidney disease: Secondary | ICD-10-CM | POA: Diagnosis not present

## 2018-02-19 DIAGNOSIS — E559 Vitamin D deficiency, unspecified: Secondary | ICD-10-CM | POA: Diagnosis not present

## 2018-02-19 DIAGNOSIS — E782 Mixed hyperlipidemia: Secondary | ICD-10-CM | POA: Diagnosis not present

## 2018-02-19 DIAGNOSIS — I129 Hypertensive chronic kidney disease with stage 1 through stage 4 chronic kidney disease, or unspecified chronic kidney disease: Secondary | ICD-10-CM | POA: Diagnosis not present

## 2018-02-19 DIAGNOSIS — E1142 Type 2 diabetes mellitus with diabetic polyneuropathy: Secondary | ICD-10-CM | POA: Diagnosis not present

## 2018-02-19 DIAGNOSIS — N182 Chronic kidney disease, stage 2 (mild): Secondary | ICD-10-CM | POA: Diagnosis not present

## 2018-02-19 DIAGNOSIS — I2510 Atherosclerotic heart disease of native coronary artery without angina pectoris: Secondary | ICD-10-CM | POA: Diagnosis not present

## 2018-02-19 DIAGNOSIS — Z96642 Presence of left artificial hip joint: Secondary | ICD-10-CM | POA: Diagnosis not present

## 2018-03-20 ENCOUNTER — Encounter: Payer: Self-pay | Admitting: Internal Medicine

## 2018-03-20 ENCOUNTER — Ambulatory Visit (INDEPENDENT_AMBULATORY_CARE_PROVIDER_SITE_OTHER): Payer: Medicare Other | Admitting: Internal Medicine

## 2018-03-20 DIAGNOSIS — R05 Cough: Secondary | ICD-10-CM | POA: Diagnosis not present

## 2018-03-20 DIAGNOSIS — R059 Cough, unspecified: Secondary | ICD-10-CM

## 2018-03-20 MED ORDER — PREDNISONE 20 MG PO TABS: 40.0000 mg | ORAL_TABLET | Freq: Every day | ORAL | 0 refills | Status: DC

## 2018-03-20 NOTE — Progress Notes (Signed)
   Subjective:    Patient ID: [name], female    DOB: [birth year], 84 y.o.   MRN: [id number]  HPI The patient is an 84 YO female coming in for cough and SOB and wheezing for about 5 days or so. Started Monday with drainage and cough. She has been having some SOB and wheezing at night. She denies ear pain, headaches, sinus pressure. Drainage is getting some better. She is using flonase at home and cough medicine. She is coughing up some yellowish stuff. Denies fevers or chills.   Review of Systems  Constitutional: Positive for activity change. Negative for appetite change, chills, fatigue, fever and unexpected weight change.  HENT: Positive for congestion, postnasal drip and rhinorrhea. Negative for ear discharge, ear pain, sinus pressure, sinus pain, sneezing, sore throat, tinnitus, trouble swallowing and voice change.   Eyes: Negative.   Respiratory: Positive for cough, shortness of breath and wheezing. Negative for chest tightness.   Cardiovascular: Negative.   Gastrointestinal: Negative.   Neurological: Negative.       Objective:   Physical Exam  Constitutional: She is oriented to person, place, and time. She appears well-developed and well-nourished.  HENT:  Head: Normocephalic and atraumatic.  Oropharynx with redness and clear drainage, nose with swollen turbinates, TMs normal bilaterally  Eyes: EOM are normal.  Neck: Normal range of motion. No thyromegaly present.  Cardiovascular: Normal rate and regular rhythm.  Pulmonary/Chest: Effort normal and breath sounds normal. No respiratory distress. She has no wheezes. She has no rales.  Mild expiratory wheeze at the bases  Abdominal: Soft.  Musculoskeletal: She exhibits tenderness.  Lymphadenopathy:    She has no cervical adenopathy.  Neurological: She is alert and oriented to person, place, and time.  Skin: Skin is warm and dry.   Vitals:   03/20/18 1415  BP: 138/60  Pulse: 65  Temp: 98 F (36.7 C)  TempSrc: Oral  SpO2:  94%  Weight: 200 lb (90.7 kg)  Height: 5\' 4"  (1.626 m)      Assessment & Plan:

## 2018-03-20 NOTE — Assessment & Plan Note (Signed)
Rx for prednisone. Continue flonase and otc cough medicine.

## 2018-03-20 NOTE — Patient Instructions (Signed)
We have sent in prednisone to take 2 pills daily for 5 days.   

## 2018-05-14 ENCOUNTER — Other Ambulatory Visit: Payer: Self-pay | Admitting: Cardiovascular Disease

## 2018-05-18 DIAGNOSIS — E782 Mixed hyperlipidemia: Secondary | ICD-10-CM | POA: Diagnosis not present

## 2018-05-18 DIAGNOSIS — E113393 Type 2 diabetes mellitus with moderate nonproliferative diabetic retinopathy without macular edema, bilateral: Secondary | ICD-10-CM | POA: Diagnosis not present

## 2018-05-18 DIAGNOSIS — Z794 Long term (current) use of insulin: Secondary | ICD-10-CM | POA: Diagnosis not present

## 2018-05-18 LAB — BASIC METABOLIC PANEL
BUN: 30 — AB (ref 4–21)
Creatinine: 1.3 — AB (ref 0.5–1.1)
Glucose: 64
Potassium: 4.3 (ref 3.4–5.3)
Sodium: 143 (ref 137–147)

## 2018-05-18 LAB — HEMOGLOBIN A1C: Hemoglobin A1C: 7

## 2018-05-18 LAB — LIPID PANEL
Cholesterol: 137 (ref 0–200)
HDL: 28 — AB (ref 35–70)
LDL Cholesterol: 67
Triglycerides: 206 — AB (ref 40–160)

## 2018-05-18 LAB — HEPATIC FUNCTION PANEL
ALT: 12 (ref 7–35)
AST: 17 (ref 13–35)
Alkaline Phosphatase: 85 (ref 25–125)
Bilirubin, Total: 0.4

## 2018-05-29 ENCOUNTER — Other Ambulatory Visit: Payer: Self-pay | Admitting: Cardiovascular Disease

## 2018-06-11 DIAGNOSIS — Z794 Long term (current) use of insulin: Secondary | ICD-10-CM | POA: Diagnosis not present

## 2018-06-11 DIAGNOSIS — E1122 Type 2 diabetes mellitus with diabetic chronic kidney disease: Secondary | ICD-10-CM | POA: Diagnosis not present

## 2018-06-11 DIAGNOSIS — I129 Hypertensive chronic kidney disease with stage 1 through stage 4 chronic kidney disease, or unspecified chronic kidney disease: Secondary | ICD-10-CM | POA: Diagnosis not present

## 2018-06-11 DIAGNOSIS — D631 Anemia in chronic kidney disease: Secondary | ICD-10-CM | POA: Diagnosis not present

## 2018-06-11 DIAGNOSIS — E559 Vitamin D deficiency, unspecified: Secondary | ICD-10-CM | POA: Diagnosis not present

## 2018-06-11 DIAGNOSIS — E1165 Type 2 diabetes mellitus with hyperglycemia: Secondary | ICD-10-CM | POA: Diagnosis not present

## 2018-06-11 DIAGNOSIS — N182 Chronic kidney disease, stage 2 (mild): Secondary | ICD-10-CM | POA: Diagnosis not present

## 2018-06-11 DIAGNOSIS — E782 Mixed hyperlipidemia: Secondary | ICD-10-CM | POA: Diagnosis not present

## 2018-06-11 DIAGNOSIS — E113393 Type 2 diabetes mellitus with moderate nonproliferative diabetic retinopathy without macular edema, bilateral: Secondary | ICD-10-CM | POA: Diagnosis not present

## 2018-06-11 DIAGNOSIS — Z7982 Long term (current) use of aspirin: Secondary | ICD-10-CM | POA: Diagnosis not present

## 2018-06-11 DIAGNOSIS — E1142 Type 2 diabetes mellitus with diabetic polyneuropathy: Secondary | ICD-10-CM | POA: Diagnosis not present

## 2018-06-15 ENCOUNTER — Encounter: Payer: Self-pay | Admitting: Internal Medicine

## 2018-06-15 NOTE — Progress Notes (Signed)
Abstracted and sent to scan  

## 2018-08-06 ENCOUNTER — Ambulatory Visit (INDEPENDENT_AMBULATORY_CARE_PROVIDER_SITE_OTHER): Payer: Medicare Other | Admitting: Internal Medicine

## 2018-08-06 ENCOUNTER — Encounter: Payer: Self-pay | Admitting: Internal Medicine

## 2018-08-06 VITALS — BP 128/60 | HR 58 | Temp 98.3°F | Ht 64.0 in | Wt 198.0 lb

## 2018-08-06 DIAGNOSIS — R300 Dysuria: Secondary | ICD-10-CM

## 2018-08-06 DIAGNOSIS — R05 Cough: Secondary | ICD-10-CM

## 2018-08-06 DIAGNOSIS — R059 Cough, unspecified: Secondary | ICD-10-CM

## 2018-08-06 LAB — POCT URINALYSIS DIPSTICK
Bilirubin, UA: NEGATIVE
Blood, UA: NEGATIVE
Glucose, UA: NEGATIVE
Ketones, UA: NEGATIVE
Nitrite, UA: POSITIVE
Protein, UA: POSITIVE — AB
Spec Grav, UA: 1.015 (ref 1.010–1.025)
Urobilinogen, UA: 0.2 E.U./dL
pH, UA: 6 (ref 5.0–8.0)

## 2018-08-06 MED ORDER — PREDNISONE 20 MG PO TABS: 40.0000 mg | ORAL_TABLET | Freq: Every day | ORAL | 0 refills | Status: DC

## 2018-08-06 MED ORDER — NITROFURANTOIN MONOHYD MACRO 100 MG PO CAPS: 100.0000 mg | ORAL_CAPSULE | Freq: Two times a day (BID) | ORAL | 0 refills | Status: DC

## 2018-08-06 NOTE — Progress Notes (Signed)
   Subjective:    Patient ID: [name], female    DOB: 12/17/1930, 84 y.o.   MRN: [id number]  HPI The patient is an 84 YO female coming in for several concerns including urinary (burning some and frequency with change in odor, going on 1-2 weeks, overall worsening, tried increasing water intake without relief, denies fevers or chills, denies back pain or stomach pain), and cough (started with clear sputum, now still some, also with SOB and fatigue, some chills in the beginning, gradually improving, she does have asthma which is sensitive to allergens and she denies sick contacts, denies nasal symptoms or sinus pressure).   Review of Systems  Constitutional: Positive for activity change, appetite change and chills. Negative for fatigue, fever and unexpected weight change.  HENT: Positive for congestion and postnasal drip. Negative for ear discharge, ear pain, rhinorrhea, sinus pressure, sinus pain, sneezing, sore throat, tinnitus, trouble swallowing and voice change.   Eyes: Negative.   Respiratory: Positive for cough. Negative for chest tightness, shortness of breath and wheezing.   Cardiovascular: Negative.   Gastrointestinal: Negative.  Negative for abdominal distention, constipation, diarrhea, nausea and vomiting.  Genitourinary: Positive for dysuria, frequency and urgency.  Musculoskeletal: Positive for myalgias.  Skin: Negative.   Neurological: Negative.       Objective:   Physical Exam  Constitutional: She is oriented to person, place, and time. She appears well-developed and well-nourished. No distress.  HENT:  Head: Normocephalic and atraumatic.  Oropharynx with redness and clear drainage, nose normal, TMs normal bilaterally  Eyes: EOM are normal.  Neck: Normal range of motion. No thyromegaly present.  Cardiovascular: Normal rate and regular rhythm.  Pulmonary/Chest: Effort normal and breath sounds normal. No respiratory distress. She has no wheezes. She has no rales.  Mild  rhonchi which partially clear with coughing  Abdominal: Soft.  Musculoskeletal: She exhibits tenderness.  Lymphadenopathy:    She has no cervical adenopathy.  Neurological: She is alert and oriented to person, place, and time.  Skin: Skin is warm and dry.  Psychiatric: She has a normal mood and affect.   Vitals:   08/06/18 1506  BP: 128/60  Pulse: (!) 58  Temp: 98.3 F (36.8 C)  TempSrc: Oral  SpO2: 93%  Weight: 198 lb (89.8 kg)  Height: 5\' 4"  (1.626 m)      Assessment & Plan:

## 2018-08-06 NOTE — Patient Instructions (Addendum)
We have sent in macrobid (nitrofurantoin) to take 1 pill twice a day for 1 week.   We have sent in the prednisone 2 pills daily for 5 days for the cough.

## 2018-08-07 DIAGNOSIS — R300 Dysuria: Secondary | ICD-10-CM | POA: Insufficient documentation

## 2018-08-07 NOTE — Assessment & Plan Note (Signed)
POC U/A done in the office consistent with infection. Rx for macrobid and call back if no improvement.

## 2018-08-07 NOTE — Assessment & Plan Note (Signed)
Rx for prednisone for cough to avoid flare. No indication for antibiotics today.

## 2018-09-03 ENCOUNTER — Encounter: Payer: Self-pay | Admitting: Cardiovascular Disease

## 2018-09-03 ENCOUNTER — Ambulatory Visit (INDEPENDENT_AMBULATORY_CARE_PROVIDER_SITE_OTHER): Payer: Medicare Other | Admitting: Cardiovascular Disease

## 2018-09-03 VITALS — BP 128/58 | HR 62 | Ht 64.0 in | Wt 196.8 lb

## 2018-09-03 DIAGNOSIS — Z23 Encounter for immunization: Secondary | ICD-10-CM | POA: Diagnosis not present

## 2018-09-03 DIAGNOSIS — I10 Essential (primary) hypertension: Secondary | ICD-10-CM

## 2018-09-03 DIAGNOSIS — I5032 Chronic diastolic (congestive) heart failure: Secondary | ICD-10-CM

## 2018-09-03 MED ORDER — AMLODIPINE BESYLATE 5 MG PO TABS: 5.0000 mg | ORAL_TABLET | Freq: Every day | ORAL | 3 refills | Status: DC

## 2018-09-03 NOTE — Progress Notes (Signed)
Cardiology Office Note   Date:  09/03/2018   ID:  [name], DOB 1931/05/29, MRN [id number]  PCP:  Hoyt Koch, MD  Cardiologist: Darlin Coco MD, now Trevelle Mcgurn  No chief complaint on file.  Problem list 1. Chronic diastolic congestive heart failure 2. History of pericardial effusion-status post pericardial window 3.  Aortic stenosis 4. Diabetes mellitus 5. Coronary artery disease 6. Essential hypertension 7. Hyperlipidemia   Notes from [place] - Feb. 2017:  [name] is a 84 y.o. female who presents for a six-month follow-up visit   She has a past history of diastolic congestive heart failure. Her last echocardiogram on 02/10/12 showed an ejection fraction of 60% with normal systolic function and grade 1 diastolic dysfunction. She has a past history of pericardial effusion with pericardial tamponade and in 2010 and underwen a pericardial window with good results. she has a history of mild aortic stenosis. Since we last saw her she underwent left hip replacement by Dr. Mayer Camel and has done well.  Her last echocardiogram was 02/10/12 and showed an ejection fraction of 60%. Since last visit she has been feeling well. Since last visit her husband has been diagnosed with early Alzheimer's but fortunately he seems to be responding nicely to Aricept.Her last visit we gave her a trial off gabapentin for peripheral neuropathy.Initially she did not think that it helped but now she thinks that it has helped and she would like to try a stronger dose.  We will increase the dose to 600 mg each evening. She reports that she is only taking 80 mg of Lasix daily and this has been sufficient to keep her edema at a minimum. He has not been having any recent chest pain.  She is not having any increase in peripheral edema on the lower dose of Lasix.  Her breathing is improved.  Oct. 18, 2017: Still some dyspnea. Does not get any regular  exercise Lots of back and leg pain   Jan.  24, 2018:  Was admitted to the hospital in Dec for shortness of breath .  Was found to have Grade 2 diastolic dysfunction  Was found to have CKD - Diovan was stopped.  BP has been elevated since that time   Aug. 28, 2018:  [name] is seen today .    She does have DOE.  Does not exercise.  Has chronic diastolic CHF  February 05, 6294:  [name]  is seen today for follow-up of her chronic diastolic congestive heart failure and hypertension. Feeling well  Husband died 2 weeks ago .  [name]) ,  Had been in a SNF for 6 months  Walks with walker.  Has lost some weight .  Wt. is 201 lbs today   September 03, 2018: Clear is seen today for follow-up visit.  She has a history of chronic diastolic congestive heart failure.  She has a history of hypertension.  Overall , seems to be doing well   Past Medical History:  Diagnosis Date  . Anasarca 04/2009   found to be secondary to pericardial effusion with tamponade/pericardial window done  . Chronic diastolic CHF (congestive heart failure) ([place])   . CKD (chronic kidney disease), stage III ([place])   . Coronary artery disease    a. mild-mod CAD 2004.  . Diabetes mellitus    insulin dependent  . Herpes labialis    Healing herpes labialis  . Hypercholesterolemia   . Hypertension   . Lumbar radiculopathy   .  Lumbar scoliosis   . Morbid obesity ([place])   . Pericardial effusion 04/2009   a. 2010 with tamponade s/p window.  [name] PONV (postoperative nausea and vomiting)    has not had any problems since 2001  . Spondylolisthesis   . Spondylosis     Past Surgical History:  Procedure Laterality Date  . BACK SURGERY     microdiskectomy L3-4 on L/partial facetectomy 3-4 on L/removal of synovial cyst on left L3-4  . BREAST BIOPSY     left  . CARDIAC CATHETERIZATION  04/25/2003   L heart cath w/coronary angiography/R femoral artery/ EF 65%/no mitral regurgitation/ angiography L main coronary artery smooth & normal  . EYE SURGERY      ophthalmoscopy/peritomy adjacent to the limbus from the 8 to 2:30 position superiorly  . lumbar laminectomy    . SUBXYPHOID PERICARDIAL WINDOW  04/2009   for pericardial effusion w/pericardial tamponade/sac drained w/20-French Baard drain/transesophageal echocardiogram confimed complete evacution of pericardial fluid  . TOTAL HIP ARTHROPLASTY  03/06/2012   Procedure: TOTAL HIP ARTHROPLASTY;  Surgeon: Kerin Salen, MD;  Location: [facility];  Service: Orthopedics;  Laterality: Left;  DEPUY/PINNACLE, SUMMIT STEM, CUP POLY & CERAMIC  . VAGINAL HYSTERECTOMY       Current Outpatient Medications  Medication Sig Dispense Refill  . ALPRAZolam (XANAX) 0.25 MG tablet Take 1 tablet (0.25 mg total) by mouth daily as needed for anxiety. 20 tablet 0  . aspirin EC 81 MG tablet Take 81 mg by mouth every morning.    [name] atorvastatin (LIPITOR) 80 MG tablet Take 40 mg by mouth every evening.    . ergocalciferol (VITAMIN D2) 50000 UNITS capsule Take 50,000 Units by mouth once a week. On friday    . fluticasone (FLONASE) 50 MCG/ACT nasal spray Place 2 sprays into both nostrils daily. 16 g 1  . furosemide (LASIX) 80 MG tablet TAKE 1 TABLET BY MOUTH ONCE DAILY 90 tablet 2  . gabapentin (NEURONTIN) 300 MG capsule 2 TABLETS BY MOUTH AT BEDTIME 60 capsule 1  . GLIPIZIDE XL 5 MG 24 hr tablet Take 5 mg by mouth Daily.    . hydrALAZINE (APRESOLINE) 25 MG tablet TAKE 1 TABLET BY MOUTH TWICE A DAY 180 tablet 2  . insulin aspart (NOVOLOG) 100 UNIT/ML injection Inject into the skin as directed. Based on a sliding scale. Inject 3-4 units into the skin daily with breakfast, inject 3-4 units into the skin with lunch. Inject 8 units into the skin daily with dinner.    . insulin glargine (LANTUS) 100 UNIT/ML injection Inject 20 Units into the skin at bedtime.     . IRON PO Take 1 tablet by mouth daily.     . metoprolol tartrate (LOPRESSOR) 25 MG tablet Take 25 mg by mouth 2 (two) times daily.    . nitrofurantoin,  macrocrystal-monohydrate, (MACROBID) 100 MG capsule Take 1 capsule (100 mg total) by mouth 2 (two) times daily. 14 capsule 0  . Polyethyl Glycol-Propyl Glycol (SYSTANE) 0.4-0.3 % SOLN Apply 2 drops to eye daily as needed.    . predniSONE (DELTASONE) 20 MG tablet Take 2 tablets (40 mg total) by mouth daily with breakfast. 10 tablet 0  . amLODipine (NORVASC) 5 MG tablet Take 1 tablet (5 mg total) by mouth daily. 90 tablet 3   No current facility-administered medications for this visit.     Allergies:   Atenolol; Codeine; Lactose intolerance (gi); Lodine [etodolac]; Percocet [oxycodone-acetaminophen]; Sulfa drugs cross reactors; Sulfamethoxazole; Benzonatate; Escitalopram oxalate; Metformin; Oxycodone-acetaminophen; and  Pregabalin    Social History:  The patient  reports that she has never smoked. She has never used smokeless tobacco. She reports that she does not drink alcohol or use drugs.   Family History:  The patient's family history includes Angina in her mother; Coronary artery disease in her unknown relative; Heart attack in her father; Hypertension in her father; Stroke in her mother.    ROS:  Please see the history of present illness.   Otherwise, review of systems are positive for none.   All other systems are reviewed and negative.    Physical Exam: Blood pressure (!) 128/58, pulse 62, height 5\' 4"  (1.626 m), weight 196 lb 12.8 oz (89.3 kg), SpO2 93 %.  GEN: Elderly female, moderately obese HEENT: Normal NECK: No JVD; No carotid bruits LYMPHATICS: No lymphadenopathy CARDIAC: RRR   RESPIRATORY:  Clear to auscultation without rales, wheezing or rhonchi  ABDOMEN: Soft, non-tender, non-distended MUSCULOSKELETAL:  No edema; No deformity  SKIN: Warm and dry NEUROLOGIC:  Alert and oriented x 3  EKG:    Oct. 31, 2019:   NSR at 54.   LAHB.     Recent Labs: 11/04/2017: Hemoglobin 12.2 11/17/2017: Platelets 230 05/18/2018: ALT 12; BUN 30; Creatinine 1.3; Potassium 4.3; Sodium 143     Lipid Panel    Component Value Date/Time   CHOL 137 05/18/2018   TRIG 206 (A) 05/18/2018   HDL 28 (A) 05/18/2018   CHOLHDL 3.1 04/21/2009 0620   VLDL 19 04/21/2009 0620   LDLCALC 67 05/18/2018    Wt Readings from Last 3 Encounters:  09/03/18 196 lb 12.8 oz (89.3 kg)  08/06/18 198 lb (89.8 kg)  03/20/18 200 lb (90.7 kg)     ASSESSMENT AND PLAN:  1.  Chronic diastolic heart failure -she seems to be very stable.  Not having any episodes of chest pain or shortness of breath.  2.  History of pericarditis: No recurrent episodes of pericarditis.  3. diabetes mellitus with diabetic neuropathy  4. essential hypertension:   Blood pressures well controlled.  Give her amlodipine 5 mg tablets.  She is currently cutting 10 mg tablets in half.  Legs remain mildly swollen.  Advised her to stay away from salt.  Advised leg elevation.  Current medicines are reviewed at length with the patient today.  The patient does not have concerns regarding medicines.    Labs/ tests ordered today include:   Orders Placed This Encounter  Procedures  . Basic Metabolic Panel (BMET)  . EKG 12-Lead     Mertie Moores, MD  09/03/2018 4:35 PM    [address],  [address], [place]  [address] Pager [phone] Phone: [phone]; Fax: [phone]

## 2018-09-03 NOTE — Patient Instructions (Signed)
Medication Instructions:  Your physician has recommended you make the following change in your medication:   DECREASE Amlodipine to 5 mg daily (sending new tablets so you don't have to split)  If you need a refill on your cardiac medications before your next appointment, please call your pharmacy.    Lab work: TODAY - basic metabolic panel  If you have labs (blood work) drawn today and your tests are completely normal, you will receive your results only by: Marland Kitchen MyChart Message (if you have MyChart) OR . A paper copy in the mail If you have any lab test that is abnormal or we need to change your treatment, we will call you to review the results.   Testing/Procedures: None Ordered   Follow-Up: At [facility], you and your health needs are our priority.  As part of our continuing mission to provide you with exceptional heart care, we have created designated Provider Care Teams.  These Care Teams include your primary Cardiologist (physician) and Advanced Practice Providers (APPs -  Physician Assistants and Nurse Practitioners) who all work together to provide you with the care you need, when you need it. You will need a follow up appointment in:  6 months.  Please call our office 2 months in advance to schedule this appointment.  You may see Mertie Moores, MD or one of the following Advanced Practice Providers on your designated Care Team: Richardson Dopp, PA-C [place], [place] . Daune Perch, NP

## 2018-09-04 LAB — BASIC METABOLIC PANEL
BUN/Creatinine Ratio: 22 (ref 12–28)
BUN: 34 mg/dL — ABNORMAL HIGH (ref 8–27)
CO2: 22 mmol/L (ref 20–29)
Calcium: 9.5 mg/dL (ref 8.7–10.3)
Chloride: 105 mmol/L (ref 96–106)
Creatinine, Ser: 1.55 mg/dL — ABNORMAL HIGH (ref 0.57–1.00)
GFR calc Af Amer: 35 mL/min/{1.73_m2} — ABNORMAL LOW (ref >59)
GFR calc non Af Amer: 30 mL/min/{1.73_m2} — ABNORMAL LOW (ref >59)
Glucose: 140 mg/dL — ABNORMAL HIGH (ref 65–99)
Potassium: 4.7 mmol/L (ref 3.5–5.2)
Sodium: 144 mmol/L (ref 134–144)

## 2018-09-09 DIAGNOSIS — E1165 Type 2 diabetes mellitus with hyperglycemia: Secondary | ICD-10-CM | POA: Diagnosis not present

## 2018-09-09 DIAGNOSIS — Z794 Long term (current) use of insulin: Secondary | ICD-10-CM | POA: Diagnosis not present

## 2018-09-09 DIAGNOSIS — E782 Mixed hyperlipidemia: Secondary | ICD-10-CM | POA: Diagnosis not present

## 2018-09-14 DIAGNOSIS — E559 Vitamin D deficiency, unspecified: Secondary | ICD-10-CM | POA: Diagnosis not present

## 2018-09-14 DIAGNOSIS — E782 Mixed hyperlipidemia: Secondary | ICD-10-CM | POA: Diagnosis not present

## 2018-09-14 DIAGNOSIS — E1165 Type 2 diabetes mellitus with hyperglycemia: Secondary | ICD-10-CM | POA: Diagnosis not present

## 2018-09-14 DIAGNOSIS — E113393 Type 2 diabetes mellitus with moderate nonproliferative diabetic retinopathy without macular edema, bilateral: Secondary | ICD-10-CM | POA: Diagnosis not present

## 2018-09-14 DIAGNOSIS — Z794 Long term (current) use of insulin: Secondary | ICD-10-CM | POA: Diagnosis not present

## 2018-09-14 DIAGNOSIS — D631 Anemia in chronic kidney disease: Secondary | ICD-10-CM | POA: Diagnosis not present

## 2018-09-14 DIAGNOSIS — N182 Chronic kidney disease, stage 2 (mild): Secondary | ICD-10-CM | POA: Diagnosis not present

## 2018-09-14 DIAGNOSIS — E1142 Type 2 diabetes mellitus with diabetic polyneuropathy: Secondary | ICD-10-CM | POA: Diagnosis not present

## 2018-09-14 DIAGNOSIS — I129 Hypertensive chronic kidney disease with stage 1 through stage 4 chronic kidney disease, or unspecified chronic kidney disease: Secondary | ICD-10-CM | POA: Diagnosis not present

## 2018-12-14 DIAGNOSIS — Z794 Long term (current) use of insulin: Secondary | ICD-10-CM | POA: Diagnosis not present

## 2018-12-14 DIAGNOSIS — E782 Mixed hyperlipidemia: Secondary | ICD-10-CM | POA: Diagnosis not present

## 2018-12-14 DIAGNOSIS — E1165 Type 2 diabetes mellitus with hyperglycemia: Secondary | ICD-10-CM | POA: Diagnosis not present

## 2018-12-14 LAB — BASIC METABOLIC PANEL
BUN: 30 — AB (ref 4–21)
Creatinine: 1.4 — AB (ref 0.5–1.1)
Glucose: 125
Potassium: 4 (ref 3.4–5.3)
Sodium: 142 (ref 137–147)

## 2018-12-14 LAB — LIPID PANEL
Cholesterol: 144 (ref 0–200)
HDL: 25 — AB (ref 35–70)
LDL Cholesterol: 74
Triglycerides: 213 — AB (ref 40–160)

## 2018-12-14 LAB — HEPATIC FUNCTION PANEL
ALT: 13 (ref 7–35)
AST: 12 — AB (ref 13–35)
Alkaline Phosphatase: 93 (ref 25–125)
Bilirubin, Total: 0.5

## 2018-12-14 LAB — HEMOGLOBIN A1C: Hemoglobin A1C: 7.3

## 2018-12-15 DIAGNOSIS — H35033 Hypertensive retinopathy, bilateral: Secondary | ICD-10-CM | POA: Diagnosis not present

## 2018-12-15 DIAGNOSIS — Z961 Presence of intraocular lens: Secondary | ICD-10-CM | POA: Diagnosis not present

## 2018-12-15 DIAGNOSIS — H35372 Puckering of macula, left eye: Secondary | ICD-10-CM | POA: Diagnosis not present

## 2018-12-15 DIAGNOSIS — D3131 Benign neoplasm of right choroid: Secondary | ICD-10-CM | POA: Diagnosis not present

## 2018-12-15 LAB — HM DIABETES EYE EXAM

## 2018-12-17 DIAGNOSIS — E782 Mixed hyperlipidemia: Secondary | ICD-10-CM | POA: Diagnosis not present

## 2018-12-17 DIAGNOSIS — N182 Chronic kidney disease, stage 2 (mild): Secondary | ICD-10-CM | POA: Diagnosis not present

## 2018-12-17 DIAGNOSIS — E1142 Type 2 diabetes mellitus with diabetic polyneuropathy: Secondary | ICD-10-CM | POA: Diagnosis not present

## 2018-12-17 DIAGNOSIS — Z794 Long term (current) use of insulin: Secondary | ICD-10-CM | POA: Diagnosis not present

## 2018-12-17 DIAGNOSIS — E559 Vitamin D deficiency, unspecified: Secondary | ICD-10-CM | POA: Diagnosis not present

## 2018-12-17 DIAGNOSIS — E113393 Type 2 diabetes mellitus with moderate nonproliferative diabetic retinopathy without macular edema, bilateral: Secondary | ICD-10-CM | POA: Diagnosis not present

## 2018-12-17 DIAGNOSIS — I129 Hypertensive chronic kidney disease with stage 1 through stage 4 chronic kidney disease, or unspecified chronic kidney disease: Secondary | ICD-10-CM | POA: Diagnosis not present

## 2018-12-17 DIAGNOSIS — E1165 Type 2 diabetes mellitus with hyperglycemia: Secondary | ICD-10-CM | POA: Diagnosis not present

## 2018-12-17 DIAGNOSIS — D631 Anemia in chronic kidney disease: Secondary | ICD-10-CM | POA: Diagnosis not present

## 2018-12-23 ENCOUNTER — Encounter: Payer: Self-pay | Admitting: Internal Medicine

## 2018-12-23 NOTE — Progress Notes (Signed)
Abstracted and sent to scan  

## 2019-01-29 ENCOUNTER — Other Ambulatory Visit: Payer: Self-pay | Admitting: Cardiovascular Disease

## 2019-01-29 MED ORDER — HYDRALAZINE HCL 25 MG PO TABS: 25.0000 mg | ORAL_TABLET | Freq: Two times a day (BID) | ORAL | 1 refills | Status: DC

## 2019-02-01 ENCOUNTER — Telehealth: Payer: Self-pay

## 2019-02-01 MED ORDER — HYDRALAZINE HCL 25 MG PO TABS: 25.0000 mg | ORAL_TABLET | Freq: Two times a day (BID) | ORAL | 0 refills | Status: DC

## 2019-02-01 NOTE — Telephone Encounter (Signed)
Requested Prescriptions   Signed Prescriptions Disp Refills  . hydrALAZINE (APRESOLINE) 25 MG tablet 60 tablet 0    Sig: Take 1 tablet (25 mg total) by mouth 2 (two) times daily.    Authorizing Provider: Thayer Headings    Ordering User: Raelene Bott, Luisana Lutzke L

## 2019-02-10 ENCOUNTER — Telehealth: Payer: Self-pay | Admitting: Physician Assistant

## 2019-02-10 NOTE — Telephone Encounter (Signed)
   TELEPHONE CALL NOTE  This patient has been deemed a candidate for follow-up tele-health visit to limit community exposure during the Covid-19 pandemic. I spoke with the patient via phone to discuss instructions. I have sent (dotphrase: [email or web address]) through [facility]. The patient was advised to review the section on consent for treatment as well. The patient will receive a phone call 2-3 days prior to their E-Visit at which time consent will be verbally confirmed. A Virtual Office Visit appointment type has been scheduled for  with 6 months follow, with Vin Giovonnie Trettel, PAC "VIDEO" or "TELEPHONE" in the appointment notes - patient prefers VIDEO type. She will get his son's help to set up this appointment.  I have either confirmed the patient is active in MyChart or offered to send sign-up link to phone/email via Mychart icon beside patient's photo.  [place], [place] 02/10/2019 1:42 PM

## 2019-02-24 ENCOUNTER — Telehealth: Payer: Self-pay | Admitting: Physician Assistant

## 2019-02-24 ENCOUNTER — Other Ambulatory Visit: Payer: Self-pay | Admitting: Cardiovascular Disease

## 2019-02-24 MED ORDER — FUROSEMIDE 80 MG PO TABS: 80.0000 mg | ORAL_TABLET | Freq: Every day | ORAL | 1 refills | Status: DC

## 2019-02-24 NOTE — Telephone Encounter (Signed)
Spoke with patient who confirmed all demographics. Patient will be using her son's smart phone. She uses My Chart. Will have vitals ready for visit.

## 2019-03-02 ENCOUNTER — Telehealth (INDEPENDENT_AMBULATORY_CARE_PROVIDER_SITE_OTHER): Payer: Medicare Other | Admitting: Physician Assistant

## 2019-03-02 ENCOUNTER — Other Ambulatory Visit: Payer: Self-pay

## 2019-03-02 ENCOUNTER — Encounter: Payer: Self-pay | Admitting: Physician Assistant

## 2019-03-02 VITALS — BP 167/76 | HR 61 | Ht 64.0 in | Wt 195.0 lb

## 2019-03-02 DIAGNOSIS — I2510 Atherosclerotic heart disease of native coronary artery without angina pectoris: Secondary | ICD-10-CM

## 2019-03-02 DIAGNOSIS — E782 Mixed hyperlipidemia: Secondary | ICD-10-CM

## 2019-03-02 DIAGNOSIS — I5032 Chronic diastolic (congestive) heart failure: Secondary | ICD-10-CM

## 2019-03-02 DIAGNOSIS — I10 Essential (primary) hypertension: Secondary | ICD-10-CM

## 2019-03-02 DIAGNOSIS — Z7189 Other specified counseling: Secondary | ICD-10-CM

## 2019-03-02 NOTE — Patient Instructions (Signed)
Medication Instructions:  Your physician recommends that you continue on your current medications as directed. Please refer to the Current Medication list given to you today.  If you need a refill on your cardiac medications before your next appointment, please call your pharmacy.   Lab work: None ordered  If you have labs (blood work) drawn today and your tests are completely normal, you will receive your results only by: Marland Kitchen MyChart Message (if you have MyChart) OR . A paper copy in the mail If you have any lab test that is abnormal or we need to change your treatment, we will call you to review the results.  Testing/Procedures: None ordered  Follow-Up: At [facility], you and your health needs are our priority.  As part of our continuing mission to provide you with exceptional heart care, we have created designated Provider Care Teams.  These Care Teams include your primary Cardiologist (physician) and Advanced Practice Providers (APPs -  Physician Assistants and Nurse Practitioners) who all work together to provide you with the care you need, when you need it. You will need a follow up appointment in:  6 months.  Please call our office 2 months in advance to schedule this appointment.  You may see Mertie Moores, MD or one of the following Advanced Practice Providers on your designated Care Team: Richardson Dopp, PA-C [place], [place] . Daune Perch, NP  Any Other Special Instructions Will Be Listed Below (If Applicable).

## 2019-03-02 NOTE — Progress Notes (Signed)
Virtual Visit via Telephone Note   This visit type was conducted due to national recommendations for restrictions regarding the COVID-19 Pandemic (e.g. social distancing) in an effort to limit this patient's exposure and mitigate transmission in our community.  Due to her co-morbid illnesses, this patient is at least at moderate risk for complications without adequate follow up.  This format is felt to be most appropriate for this patient at this time.  The patient did not have access to video technology/had technical difficulties with video requiring transitioning to audio format only (telephone).  All issues noted in this document were discussed and addressed.  No physical exam could be performed with this format.  Please refer to the patient's chart for her  consent to telehealth for [facility].   Evaluation Performed:  Follow-up visit  Date:  03/02/2019   ID:  [name], DOB 03-29-1931, MRN [id number]  Patient Location: Home Provider Location: Home  PCP:  Hoyt Koch, MD  Cardiologist:  Mertie Moores, MD  Chief Complaint:  6 months follow up  History of Present Illness:    [name] is a 85 y.o. female with hx of CAD, chronic diastolic CHF, HTN, HLD, hx of pericardia effusion s/p pericardial window and DM seen for  follow up.  She has a past history of pericardial effusion with pericardial tamponade and in 2010 and underwen a pericardial window with good results. she has a history of mild aortic stenosis. Her last echocardiogram was 10/2016 and showed an ejection fraction of 55-60%, grade 2 DD, PA pressure of 47 mm HG.  She was doing well when last seen by Dr. Acie Fredrickson 08/2018.  She is living with her son since her husband died.  Patient with limited activity due to chronic issue.  She denies chest pain, shortness of breath, palpitation, dizziness, orthopnea, PND, syncope or lower extremity edema.  Compliant with medication.  Blood pressure elevated by report however  patient unsure about proper reading and machine.  The patient does not have symptoms concerning for COVID-19 infection (fever, chills, cough, or new shortness of breath).    Past Medical History:  Diagnosis Date  . Anasarca 04/2009   found to be secondary to pericardial effusion with tamponade/pericardial window done  . Chronic diastolic CHF (congestive heart failure) ([place])   . CKD (chronic kidney disease), stage III ([place])   . Coronary artery disease    a. mild-mod CAD 2004.  . Diabetes mellitus    insulin dependent  . Herpes labialis    Healing herpes labialis  . Hypercholesterolemia   . Hypertension   . Lumbar radiculopathy   . Lumbar scoliosis   . Morbid obesity ([place])   . Pericardial effusion 04/2009   a. 2010 with tamponade s/p window.  Marland Kitchen PONV (postoperative nausea and vomiting)    has not had any problems since 2001  . Spondylolisthesis   . Spondylosis    Past Surgical History:  Procedure Laterality Date  . BACK SURGERY     microdiskectomy L3-4 on L/partial facetectomy 3-4 on L/removal of synovial cyst on left L3-4  . BREAST BIOPSY     left  . CARDIAC CATHETERIZATION  04/25/2003   L heart cath w/coronary angiography/R femoral artery/ EF 65%/no mitral regurgitation/ angiography L main coronary artery smooth & normal  . EYE SURGERY     ophthalmoscopy/peritomy adjacent to the limbus from the 8 to 2:30 position superiorly  . lumbar laminectomy    . SUBXYPHOID PERICARDIAL WINDOW  04/2009  for pericardial effusion w/pericardial tamponade/sac drained w/20-French Baard drain/transesophageal echocardiogram confimed complete evacution of pericardial fluid  . TOTAL HIP ARTHROPLASTY  03/06/2012   Procedure: TOTAL HIP ARTHROPLASTY;  Surgeon: Kerin Salen, MD;  Location: [place];  Service: Orthopedics;  Laterality: Left;  DEPUY/PINNACLE, SUMMIT STEM, CUP POLY & CERAMIC  . VAGINAL HYSTERECTOMY      .hme Current Meds  Medication Sig  . ALPRAZolam (XANAX) 0.25 MG tablet Take 1  tablet (0.25 mg total) by mouth daily as needed for anxiety.  Marland Kitchen amLODipine (NORVASC) 5 MG tablet Take 1 tablet (5 mg total) by mouth daily.  Marland Kitchen aspirin EC 81 MG tablet Take 81 mg by mouth every morning.  Marland Kitchen atorvastatin (LIPITOR) 40 MG tablet Take 40 mg by mouth daily.  . ergocalciferol (VITAMIN D2) 50000 UNITS capsule Take 50,000 Units by mouth once a week. On friday  . fluticasone (FLONASE) 50 MCG/ACT nasal spray Place 2 sprays into both nostrils daily.  . furosemide (LASIX) 80 MG tablet Take 1 tablet (80 mg total) by mouth daily.  Marland Kitchen gabapentin (NEURONTIN) 300 MG capsule 2 TABLETS BY MOUTH AT BEDTIME  . GLIPIZIDE XL 5 MG 24 hr tablet Take 5 mg by mouth Daily.  . hydrALAZINE (APRESOLINE) 25 MG tablet Take 1 tablet (25 mg total) by mouth 2 (two) times daily.  . insulin aspart (NOVOLOG) 100 UNIT/ML injection Inject into the skin as directed. Based on a sliding scale. Inject 3-4 units into the skin daily with breakfast, inject 3-4 units into the skin with lunch. Inject 8 units into the skin daily with dinner.  . insulin glargine (LANTUS) 100 UNIT/ML injection Inject 25 Units into the skin at bedtime.   . metoprolol tartrate (LOPRESSOR) 25 MG tablet Take 25 mg by mouth 2 (two) times daily.  . ONE TOUCH ULTRA TEST test strip Use as directed  . Polyethyl Glycol-Propyl Glycol (SYSTANE) 0.4-0.3 % SOLN Apply 2 drops to eye daily as needed.     Allergies:   Atenolol; Codeine; Lactose intolerance (gi); Lodine [etodolac]; Percocet [oxycodone-acetaminophen]; Sulfa drugs cross reactors; Sulfamethoxazole; Benzonatate; Escitalopram oxalate; Metformin; Oxycodone-acetaminophen; and Pregabalin   Social History   Tobacco Use  . Smoking status: Never Smoker  . Smokeless tobacco: Never Used  Substance Use Topics  . Alcohol use: No  . Drug use: No     Family Hx: The patient's family history includes Angina in her mother; Coronary artery disease in her unknown relative; Heart attack in her father; Hypertension  in her father; Stroke in her mother.  ROS:   Please see the history of present illness.    All other systems reviewed and are negative.   Prior CV studies:   The following studies were reviewed today:  Echo 10/2016 Study Conclusions  - Left ventricle: The cavity size was normal. Systolic function was   normal. The estimated ejection fraction was in the range of 55%   to 60%. Wall motion was normal; there were no regional wall   motion abnormalities. Features are consistent with a pseudonormal   left ventricular filling pattern, with concomitant abnormal   relaxation and increased filling pressure (grade 2 diastolic   dysfunction). - Aortic valve: Trileaflet; mildly thickened, mildly calcified   leaflets. - Mitral valve: Calcified annulus. - Pulmonary arteries: PA peak pressure: 47 mm Hg (S).  Impressions:  - The right ventricular systolic pressure was increased consistent   with moderate pulmonary hypertension.  Cath 02/2003 ANGIOGRAPHY:  The left main coronary artery is smooth and normal.  The left anterior descending artery has a few irregularities proximally.  The mid vessel has a 30-40% stenosis.  The distal LAD has moderate diffuse  irregularities, but no critical stenosis.   The first diagonal vessel is a relatively small vessel.  There is a 70-80%  stenosis at its takeoff.  The second diagonal vessel is also a small vessel,  but has a very tiny branch that is subtotally occluded.  The first diagonal  vessel is not large enough to consider angioplasty.   The left circumflex artery is a moderate sized vessel.  It gives off a large  first obtuse marginal artery which is essentially normal.  The distal  circumflex artery is unremarkable.   The right coronary artery is large and dominant.  There is a fusiform 20-25%  stenosis in the mid vessel.  The distal vessel has a few minor luminal  irregularities.  The posterior descending artery and the posterolateral   segment arteries are unremarkable.   LEFT VENTRICULOGRAM:  The left ventriculogram was performed in a 30 RAO  position.  It reveals overall well preserved left ventricular systolic  function.  The ejection fraction is around 65%.  There is no mitral  regurgitation.   COMPLICATIONS:  None.   CONCLUSION:  Mild to moderate coronary artery disease.  The primarily  stenoses are in the very small diagonal branches which are not candidates  for angioplasty due to their very small size.  It is unlikely that these are  causing significant ischemia.  We will continue with medical therapy.  She  has normal left ventricular systolic function. Labs/Other Tests and Data Reviewed:    EKG:  No ECG reviewed.  Recent Labs: 12/14/2018: ALT 13; BUN 30; Creatinine 1.4; Potassium 4.0; Sodium 142   Recent Lipid Panel Lab Results  Component Value Date/Time   CHOL 144 12/14/2018   TRIG 213 (A) 12/14/2018   HDL 25 (A) 12/14/2018   CHOLHDL 3.1 04/21/2009 06:20 AM   LDLCALC 74 12/14/2018    Wt Readings from Last 3 Encounters:  03/02/19 195 lb (88.5 kg)  09/03/18 196 lb 12.8 oz (89.3 kg)  08/06/18 198 lb (89.8 kg)     Objective:    Vital Signs:  BP (!) 167/76   Pulse 61   Ht 5\' 4"  (1.626 m)   Wt 195 lb (88.5 kg)   BMI 33.47 kg/m    VITAL SIGNS:  reviewed GEN:  no acute distress PSYCH:  normal affect  ASSESSMENT & PLAN:    1. Chronic diastolic CHF Asymptomatic.  No change in therapy.  2. HTN -Elevated by report however seems patient is unsure about how to interpret reading during my conversation and also unsure if machine is accurate.  Says her blood pressure cuff is very old.  Patient has multiple family members in medical field with RNs and EMS.  Advised to check periodically with them.  3. HLD -Continue statin.  4. DM -Per PCP.  5. Mild to moderate CAD by cath 2004 - Detained report as above  COVID-19 Education: The signs and symptoms of COVID-19 were discussed with the  patient and how to seek care for testing (follow up with PCP or arrange E-visit).  The importance of social distancing was discussed today.  Time:   Today, I have spent 8 minutes with the patient with telehealth technology discussing the above problems.     Medication Adjustments/Labs and Tests Ordered: Current medicines are reviewed at length with the patient today.  Concerns regarding  medicines are outlined above.   Tests Ordered: No orders of the defined types were placed in this encounter.   Medication Changes: No orders of the defined types were placed in this encounter.   Disposition:  Follow up in 6 month(s)  Signed, Leanor Kail, PA  03/02/2019 4:02 PM    [facility]

## 2019-03-08 ENCOUNTER — Encounter: Payer: Self-pay | Admitting: Internal Medicine

## 2019-03-22 DIAGNOSIS — N182 Chronic kidney disease, stage 2 (mild): Secondary | ICD-10-CM | POA: Diagnosis not present

## 2019-03-22 DIAGNOSIS — D631 Anemia in chronic kidney disease: Secondary | ICD-10-CM | POA: Diagnosis not present

## 2019-03-22 DIAGNOSIS — Z794 Long term (current) use of insulin: Secondary | ICD-10-CM | POA: Diagnosis not present

## 2019-03-22 DIAGNOSIS — E782 Mixed hyperlipidemia: Secondary | ICD-10-CM | POA: Diagnosis not present

## 2019-03-22 DIAGNOSIS — E1165 Type 2 diabetes mellitus with hyperglycemia: Secondary | ICD-10-CM | POA: Diagnosis not present

## 2019-03-22 LAB — CBC AND DIFFERENTIAL
HCT: 38 (ref 36–46)
Hemoglobin: 12.5 (ref 12.0–16.0)
Platelets: 204 (ref 150–399)
WBC: 9

## 2019-03-22 LAB — BASIC METABOLIC PANEL
BUN: 29 — AB (ref 4–21)
Creatinine: 1.4 — AB (ref 0.5–1.1)
Glucose: 101
Potassium: 4.8 (ref 3.4–5.3)
Sodium: 142 (ref 137–147)

## 2019-03-22 LAB — HEPATIC FUNCTION PANEL
ALT: 14 (ref 7–35)
AST: 16 (ref 13–35)
Alkaline Phosphatase: 87 (ref 25–125)
Bilirubin, Total: 0.4

## 2019-03-22 LAB — LIPID PANEL
Cholesterol: 143 (ref 0–200)
HDL: 29 — AB (ref 35–70)
LDL Cholesterol: 77
Triglycerides: 204 — AB (ref 40–160)

## 2019-03-22 LAB — HEMOGLOBIN A1C: Hemoglobin A1C: 7.3

## 2019-03-25 DIAGNOSIS — E559 Vitamin D deficiency, unspecified: Secondary | ICD-10-CM | POA: Diagnosis not present

## 2019-03-25 DIAGNOSIS — E113393 Type 2 diabetes mellitus with moderate nonproliferative diabetic retinopathy without macular edema, bilateral: Secondary | ICD-10-CM | POA: Diagnosis not present

## 2019-03-25 DIAGNOSIS — N182 Chronic kidney disease, stage 2 (mild): Secondary | ICD-10-CM | POA: Diagnosis not present

## 2019-03-25 DIAGNOSIS — E1122 Type 2 diabetes mellitus with diabetic chronic kidney disease: Secondary | ICD-10-CM | POA: Diagnosis not present

## 2019-03-25 DIAGNOSIS — E1142 Type 2 diabetes mellitus with diabetic polyneuropathy: Secondary | ICD-10-CM | POA: Diagnosis not present

## 2019-03-25 DIAGNOSIS — I129 Hypertensive chronic kidney disease with stage 1 through stage 4 chronic kidney disease, or unspecified chronic kidney disease: Secondary | ICD-10-CM | POA: Diagnosis not present

## 2019-03-25 DIAGNOSIS — D631 Anemia in chronic kidney disease: Secondary | ICD-10-CM | POA: Diagnosis not present

## 2019-03-25 DIAGNOSIS — E782 Mixed hyperlipidemia: Secondary | ICD-10-CM | POA: Diagnosis not present

## 2019-03-25 DIAGNOSIS — Z794 Long term (current) use of insulin: Secondary | ICD-10-CM | POA: Diagnosis not present

## 2019-03-26 ENCOUNTER — Encounter: Payer: Self-pay | Admitting: Internal Medicine

## 2019-03-26 NOTE — Progress Notes (Signed)
Abstracted and sent to scan  

## 2019-06-28 DIAGNOSIS — N182 Chronic kidney disease, stage 2 (mild): Secondary | ICD-10-CM | POA: Diagnosis not present

## 2019-06-28 DIAGNOSIS — D631 Anemia in chronic kidney disease: Secondary | ICD-10-CM | POA: Diagnosis not present

## 2019-06-28 DIAGNOSIS — E782 Mixed hyperlipidemia: Secondary | ICD-10-CM | POA: Diagnosis not present

## 2019-06-28 DIAGNOSIS — E559 Vitamin D deficiency, unspecified: Secondary | ICD-10-CM | POA: Diagnosis not present

## 2019-06-28 DIAGNOSIS — E1165 Type 2 diabetes mellitus with hyperglycemia: Secondary | ICD-10-CM | POA: Diagnosis not present

## 2019-06-28 DIAGNOSIS — Z794 Long term (current) use of insulin: Secondary | ICD-10-CM | POA: Diagnosis not present

## 2019-06-28 LAB — CBC AND DIFFERENTIAL
HCT: 39 (ref 36–46)
Hemoglobin: 12.5 (ref 12.0–16.0)
Platelets: 238 (ref 150–399)
WBC: 8.9

## 2019-06-28 LAB — HEPATIC FUNCTION PANEL
ALT: 15 (ref 7–35)
AST: 19 (ref 13–35)
Alkaline Phosphatase: 87 (ref 25–125)
Bilirubin, Total: 0.4

## 2019-06-28 LAB — LIPID PANEL
Cholesterol: 146 (ref 0–200)
HDL: 28 — AB (ref 35–70)
LDL Cholesterol: 72
Triglycerides: 230 — AB (ref 40–160)

## 2019-06-28 LAB — BASIC METABOLIC PANEL
BUN: 39 — AB (ref 4–21)
Creatinine: 1.5 — AB (ref 0.5–1.1)
Glucose: 96
Potassium: 4.3 (ref 3.4–5.3)
Sodium: 140 (ref 137–147)

## 2019-06-28 LAB — VITAMIN D 25 HYDROXY (VIT D DEFICIENCY, FRACTURES): Vit D, 25-Hydroxy: 52

## 2019-07-05 DIAGNOSIS — E559 Vitamin D deficiency, unspecified: Secondary | ICD-10-CM | POA: Diagnosis not present

## 2019-07-05 DIAGNOSIS — E782 Mixed hyperlipidemia: Secondary | ICD-10-CM | POA: Diagnosis not present

## 2019-07-05 DIAGNOSIS — Z794 Long term (current) use of insulin: Secondary | ICD-10-CM | POA: Diagnosis not present

## 2019-07-05 DIAGNOSIS — E1142 Type 2 diabetes mellitus with diabetic polyneuropathy: Secondary | ICD-10-CM | POA: Diagnosis not present

## 2019-07-05 DIAGNOSIS — D631 Anemia in chronic kidney disease: Secondary | ICD-10-CM | POA: Diagnosis not present

## 2019-07-05 DIAGNOSIS — E113393 Type 2 diabetes mellitus with moderate nonproliferative diabetic retinopathy without macular edema, bilateral: Secondary | ICD-10-CM | POA: Diagnosis not present

## 2019-07-05 DIAGNOSIS — I129 Hypertensive chronic kidney disease with stage 1 through stage 4 chronic kidney disease, or unspecified chronic kidney disease: Secondary | ICD-10-CM | POA: Diagnosis not present

## 2019-07-05 DIAGNOSIS — E1122 Type 2 diabetes mellitus with diabetic chronic kidney disease: Secondary | ICD-10-CM | POA: Diagnosis not present

## 2019-07-05 DIAGNOSIS — N183 Chronic kidney disease, stage 3 (moderate): Secondary | ICD-10-CM | POA: Diagnosis not present

## 2019-07-07 ENCOUNTER — Encounter: Payer: Self-pay | Admitting: Internal Medicine

## 2019-07-21 ENCOUNTER — Other Ambulatory Visit: Payer: Self-pay | Admitting: Cardiovascular Disease

## 2019-08-08 ENCOUNTER — Other Ambulatory Visit: Payer: Self-pay | Admitting: Cardiovascular Disease

## 2019-08-20 ENCOUNTER — Other Ambulatory Visit: Payer: Self-pay | Admitting: Cardiovascular Disease

## 2019-09-02 DIAGNOSIS — Z23 Encounter for immunization: Secondary | ICD-10-CM | POA: Diagnosis not present

## 2019-09-18 ENCOUNTER — Encounter (HOSPITAL_COMMUNITY): Payer: Self-pay

## 2019-09-18 ENCOUNTER — Emergency Department (HOSPITAL_COMMUNITY)
Admission: EM | Admit: 2019-09-18 | Discharge: 2019-09-18 | Disposition: A | Discharge status: Home / Self Care | Payer: Medicare Other | Source: Home / Self Care | Attending: Emergency Medicine | Admitting: Emergency Medicine

## 2019-09-18 ENCOUNTER — Emergency Department (HOSPITAL_COMMUNITY): Payer: Medicare Other

## 2019-09-18 ENCOUNTER — Other Ambulatory Visit: Payer: Self-pay

## 2019-09-18 DIAGNOSIS — R52 Pain, unspecified: Secondary | ICD-10-CM | POA: Diagnosis not present

## 2019-09-18 DIAGNOSIS — N3000 Acute cystitis without hematuria: Secondary | ICD-10-CM | POA: Insufficient documentation

## 2019-09-18 DIAGNOSIS — W19XXXA Unspecified fall, initial encounter: Secondary | ICD-10-CM

## 2019-09-18 DIAGNOSIS — I5033 Acute on chronic diastolic (congestive) heart failure: Secondary | ICD-10-CM | POA: Diagnosis not present

## 2019-09-18 DIAGNOSIS — Z23 Encounter for immunization: Secondary | ICD-10-CM | POA: Diagnosis not present

## 2019-09-18 DIAGNOSIS — Z66 Do not resuscitate: Secondary | ICD-10-CM | POA: Diagnosis not present

## 2019-09-18 DIAGNOSIS — Z794 Long term (current) use of insulin: Secondary | ICD-10-CM | POA: Insufficient documentation

## 2019-09-18 DIAGNOSIS — Y92002 Bathroom of unspecified non-institutional (private) residence single-family (private) house as the place of occurrence of the external cause: Secondary | ICD-10-CM | POA: Insufficient documentation

## 2019-09-18 DIAGNOSIS — G44209 Tension-type headache, unspecified, not intractable: Secondary | ICD-10-CM | POA: Insufficient documentation

## 2019-09-18 DIAGNOSIS — Z79899 Other long term (current) drug therapy: Secondary | ICD-10-CM | POA: Insufficient documentation

## 2019-09-18 DIAGNOSIS — S161XXA Strain of muscle, fascia and tendon at neck level, initial encounter: Secondary | ICD-10-CM

## 2019-09-18 DIAGNOSIS — R0602 Shortness of breath: Secondary | ICD-10-CM | POA: Diagnosis not present

## 2019-09-18 DIAGNOSIS — I110 Hypertensive heart disease with heart failure: Secondary | ICD-10-CM | POA: Diagnosis not present

## 2019-09-18 DIAGNOSIS — S199XXA Unspecified injury of neck, initial encounter: Secondary | ICD-10-CM | POA: Diagnosis not present

## 2019-09-18 DIAGNOSIS — R519 Headache, unspecified: Secondary | ICD-10-CM | POA: Diagnosis not present

## 2019-09-18 DIAGNOSIS — I959 Hypotension, unspecified: Secondary | ICD-10-CM | POA: Diagnosis not present

## 2019-09-18 DIAGNOSIS — N183 Chronic kidney disease, stage 3 unspecified: Secondary | ICD-10-CM | POA: Insufficient documentation

## 2019-09-18 DIAGNOSIS — M542 Cervicalgia: Secondary | ICD-10-CM | POA: Diagnosis not present

## 2019-09-18 DIAGNOSIS — W1839XA Other fall on same level, initial encounter: Secondary | ICD-10-CM | POA: Insufficient documentation

## 2019-09-18 DIAGNOSIS — I509 Heart failure, unspecified: Secondary | ICD-10-CM | POA: Diagnosis not present

## 2019-09-18 DIAGNOSIS — S299XXA Unspecified injury of thorax, initial encounter: Secondary | ICD-10-CM | POA: Diagnosis not present

## 2019-09-18 DIAGNOSIS — I5032 Chronic diastolic (congestive) heart failure: Secondary | ICD-10-CM | POA: Insufficient documentation

## 2019-09-18 DIAGNOSIS — Z20828 Contact with and (suspected) exposure to other viral communicable diseases: Secondary | ICD-10-CM | POA: Diagnosis not present

## 2019-09-18 DIAGNOSIS — I130 Hypertensive heart and chronic kidney disease with heart failure and stage 1 through stage 4 chronic kidney disease, or unspecified chronic kidney disease: Secondary | ICD-10-CM | POA: Insufficient documentation

## 2019-09-18 DIAGNOSIS — E1122 Type 2 diabetes mellitus with diabetic chronic kidney disease: Secondary | ICD-10-CM | POA: Insufficient documentation

## 2019-09-18 DIAGNOSIS — Y9389 Activity, other specified: Secondary | ICD-10-CM | POA: Insufficient documentation

## 2019-09-18 DIAGNOSIS — R0902 Hypoxemia: Secondary | ICD-10-CM | POA: Diagnosis not present

## 2019-09-18 DIAGNOSIS — S39012A Strain of muscle, fascia and tendon of lower back, initial encounter: Secondary | ICD-10-CM | POA: Insufficient documentation

## 2019-09-18 DIAGNOSIS — Z03818 Encounter for observation for suspected exposure to other biological agents ruled out: Secondary | ICD-10-CM | POA: Diagnosis not present

## 2019-09-18 DIAGNOSIS — Y999 Unspecified external cause status: Secondary | ICD-10-CM | POA: Insufficient documentation

## 2019-09-18 DIAGNOSIS — J9601 Acute respiratory failure with hypoxia: Secondary | ICD-10-CM | POA: Diagnosis not present

## 2019-09-18 DIAGNOSIS — I2510 Atherosclerotic heart disease of native coronary artery without angina pectoris: Secondary | ICD-10-CM | POA: Insufficient documentation

## 2019-09-18 DIAGNOSIS — S0990XA Unspecified injury of head, initial encounter: Secondary | ICD-10-CM | POA: Diagnosis not present

## 2019-09-18 LAB — CBC WITH DIFFERENTIAL/PLATELET
Abs Immature Granulocytes: 0.11 10*3/uL — ABNORMAL HIGH (ref 0.00–0.07)
Basophils Absolute: 0.1 10*3/uL (ref 0.0–0.1)
Basophils Relative: 0 %
Eosinophils Absolute: 0.1 10*3/uL (ref 0.0–0.5)
Eosinophils Relative: 1 %
HCT: 45 % (ref 36.0–46.0)
Hemoglobin: 13.3 g/dL (ref 12.0–15.0)
Immature Granulocytes: 1 %
Lymphocytes Relative: 11 %
Lymphs Abs: 1.3 10*3/uL (ref 0.7–4.0)
MCH: 27.1 pg (ref 26.0–34.0)
MCHC: 29.6 g/dL — ABNORMAL LOW (ref 30.0–36.0)
MCV: 91.8 fL (ref 80.0–100.0)
Monocytes Absolute: 0.5 10*3/uL (ref 0.1–1.0)
Monocytes Relative: 5 %
Neutro Abs: 9.8 10*3/uL — ABNORMAL HIGH (ref 1.7–7.7)
Neutrophils Relative %: 82 %
Platelets: 85 10*3/uL — ABNORMAL LOW (ref 150–400)
RBC: 4.9 MIL/uL (ref 3.87–5.11)
RDW: 14.1 % (ref 11.5–15.5)
WBC: 11.8 10*3/uL — ABNORMAL HIGH (ref 4.0–10.5)
nRBC: 0 % (ref 0.0–0.2)

## 2019-09-18 LAB — URINALYSIS, ROUTINE W REFLEX MICROSCOPIC
Bilirubin Urine: NEGATIVE
Glucose, UA: NEGATIVE mg/dL
Hgb urine dipstick: NEGATIVE
Ketones, ur: NEGATIVE mg/dL
Nitrite: NEGATIVE
Protein, ur: NEGATIVE mg/dL
Specific Gravity, Urine: 1.011 (ref 1.005–1.030)
pH: 5 (ref 5.0–8.0)

## 2019-09-18 LAB — BASIC METABOLIC PANEL
Anion gap: 8 (ref 5–15)
BUN: 43 mg/dL — ABNORMAL HIGH (ref 8–23)
CO2: 27 mmol/L (ref 22–32)
Calcium: 8.7 mg/dL — ABNORMAL LOW (ref 8.9–10.3)
Chloride: 101 mmol/L (ref 98–111)
Creatinine, Ser: 1.81 mg/dL — ABNORMAL HIGH (ref 0.44–1.00)
GFR calc Af Amer: 29 mL/min — ABNORMAL LOW (ref >60)
GFR calc non Af Amer: 25 mL/min — ABNORMAL LOW (ref >60)
Glucose, Bld: 135 mg/dL — ABNORMAL HIGH (ref 70–99)
Potassium: 5.3 mmol/L — ABNORMAL HIGH (ref 3.5–5.1)
Sodium: 136 mmol/L (ref 135–145)

## 2019-09-18 MED ORDER — CEPHALEXIN 500 MG PO CAPS: 500.0000 mg | ORAL_CAPSULE | Freq: Three times a day (TID) | ORAL | 0 refills | Status: DC

## 2019-09-18 MED ORDER — CEPHALEXIN 500 MG PO CAPS
500.0000 mg | ORAL_CAPSULE | Freq: Once | ORAL | Status: AC
  Administered 2019-09-18: 500 mg via ORAL
  Filled 2019-09-18: qty 1

## 2019-09-18 MED ORDER — ONDANSETRON 4 MG PO TBDP
4.0000 mg | ORAL_TABLET | Freq: Once | ORAL | Status: AC
  Administered 2019-09-18: 4 mg via ORAL
  Filled 2019-09-18: qty 1

## 2019-09-18 MED ORDER — TRAMADOL HCL 50 MG PO TABS: 50.0000 mg | ORAL_TABLET | Freq: Three times a day (TID) | ORAL | 0 refills | Status: DC | PRN

## 2019-09-18 NOTE — ED Notes (Signed)
Urine culture sent to the lab. 

## 2019-09-18 NOTE — ED Provider Notes (Signed)
[facility] DEPT Provider Note   CSN: [id number] Arrival date & time: 09/18/19  1419     History   Chief Complaint No chief complaint on file.   HPI Margaree SHARMEL BEITEL is a 85 y.o. female.     HPI   85 year old female presenting after falls.  Patient states that she fell Thursday night when getting up out of bed.  When she woke up her walker was on the opposite side of the bed. She tried getting up w/o it and fell.   She did strike her head. No LOC. Has had persistent HA and upper neck pain since. Today she fell again trying to get up from the toilet. She said she felt weak when trying to get up and fell forward. She did strike her head again. Again, no LOC. Worsened HA. No neuro complaints. Not anticoagulated.   Past Medical History:  Diagnosis Date  . Anasarca 04/2009   found to be secondary to pericardial effusion with tamponade/pericardial window done  . Chronic diastolic CHF (congestive heart failure) ([place])   . CKD (chronic kidney disease), stage III   . Coronary artery disease    a. mild-mod CAD 2004.  . Diabetes mellitus    insulin dependent  . Herpes labialis    Healing herpes labialis  . Hypercholesterolemia   . Hypertension   . Lumbar radiculopathy   . Lumbar scoliosis   . Morbid obesity ([place])   . Pericardial effusion 04/2009   a. 2010 with tamponade s/p window.  Marland Kitchen PONV (postoperative nausea and vomiting)    has not had any problems since 2001  . Spondylolisthesis   . Spondylosis     Patient Active Problem List   Diagnosis Date Noted  . Dysuria 08/07/2018  . Anemia of chronic renal failure, stage 2 (mild) 02/18/2017  . Diabetic polyneuropathy associated with type 2 diabetes mellitus ([place]) 02/18/2017  . Vitamin D deficiency 02/18/2017  . Pulmonary hypertension ([place])   . CKD (chronic kidney disease) stage 3, GFR 30-59 ml/min   . Cough   . Hypertensive retinopathy of both eyes 09/26/2015  . Pseudophakia of both eyes 09/26/2015   . Controlled type 2 diabetes mellitus with both eyes affected by moderate nonproliferative retinopathy without macular edema, with long-term current use of insulin ([place]) 06/07/2014  . Chronic diastolic CHF (congestive heart failure) ([place]) 03/18/2011  . Mixed dyslipidemia 03/18/2011  . Hypertension, essential     Past Surgical History:  Procedure Laterality Date  . BACK SURGERY     microdiskectomy L3-4 on L/partial facetectomy 3-4 on L/removal of synovial cyst on left L3-4  . BREAST BIOPSY     left  . CARDIAC CATHETERIZATION  04/25/2003   L heart cath w/coronary angiography/R femoral artery/ EF 65%/no mitral regurgitation/ angiography L main coronary artery smooth & normal  . EYE SURGERY     ophthalmoscopy/peritomy adjacent to the limbus from the 8 to 2:30 position superiorly  . lumbar laminectomy    . SUBXYPHOID PERICARDIAL WINDOW  04/2009   for pericardial effusion w/pericardial tamponade/sac drained w/20-French Baard drain/transesophageal echocardiogram confimed complete evacution of pericardial fluid  . TOTAL HIP ARTHROPLASTY  03/06/2012   Procedure: TOTAL HIP ARTHROPLASTY;  Surgeon: Kerin Salen, MD;  Location: [facility];  Service: Orthopedics;  Laterality: Left;  DEPUY/PINNACLE, SUMMIT STEM, CUP POLY & CERAMIC  . VAGINAL HYSTERECTOMY       OB History   No obstetric history on file.      Home Medications  Prior to Admission medications   Medication Sig Start Date End Date Taking? Authorizing Provider  ALPRAZolam (XANAX) 0.25 MG tablet Take 1 tablet (0.25 mg total) by mouth daily as needed for anxiety. 08/12/17   Hoyt Koch, MD  amLODipine (NORVASC) 5 MG tablet TAKE ONE TABLET BY MOUTH DAILY 08/23/19   Nahser, Wonda Cheng, MD  aspirin EC 81 MG tablet Take 81 mg by mouth every morning.    [provider]  atorvastatin (LIPITOR) 40 MG tablet Take 40 mg by mouth daily. 12/31/18   [provider]  ergocalciferol (VITAMIN D2) 50000 UNITS capsule Take 50,000  Units by mouth once a week. On friday 07/30/11   [provider]  fluticasone (FLONASE) 50 MCG/ACT nasal spray Place 2 sprays into both nostrils daily. 10/15/16   Orson Eva, MD  furosemide (LASIX) 80 MG tablet TAKE 1 TABLET BY MOUTH EVERY DAY 08/09/19   Nahser, Wonda Cheng, MD  gabapentin (NEURONTIN) 300 MG capsule 2 TABLETS BY MOUTH AT BEDTIME 08/06/16   Nahser, Wonda Cheng, MD  GLIPIZIDE XL 5 MG 24 hr tablet Take 5 mg by mouth Daily. 06/12/12   [provider]  hydrALAZINE (APRESOLINE) 25 MG tablet TAKE 1 TABLET BY MOUTH TWICE A DAY 07/21/19   Nahser, Wonda Cheng, MD  insulin aspart (NOVOLOG) 100 UNIT/ML injection Inject into the skin as directed. Based on a sliding scale. Inject 3-4 units into the skin daily with breakfast, inject 3-4 units into the skin with lunch. Inject 8 units into the skin daily with dinner.    [provider]  insulin glargine (LANTUS) 100 UNIT/ML injection Inject 25 Units into the skin at bedtime.     [provider]  metoprolol tartrate (LOPRESSOR) 25 MG tablet Take 25 mg by mouth 2 (two) times daily.    [provider]  ONE TOUCH ULTRA TEST test strip Use as directed 01/13/19   [provider]  Polyethyl Glycol-Propyl Glycol (SYSTANE) 0.4-0.3 % SOLN Apply 2 drops to eye daily as needed.    [provider]    Family History Family History  Problem Relation Age of Onset  . Heart attack Father   . Hypertension Father   . Stroke Mother   . Angina Mother   . Coronary artery disease Other     Social History Social History   Tobacco Use  . Smoking status: Never Smoker  . Smokeless tobacco: Never Used  Substance Use Topics  . Alcohol use: No  . Drug use: No     Allergies   Atenolol, Codeine, Lactose intolerance (gi), Lodine [etodolac], Percocet [oxycodone-acetaminophen], Sulfa drugs cross reactors, Sulfamethoxazole, Benzonatate, Escitalopram oxalate, Metformin, Oxycodone-acetaminophen, and Pregabalin   Review  of Systems Review of Systems All systems reviewed and negative, other than as noted in HPI.   Physical Exam Updated Vital Signs BP 134/64 (BP Location: Right Arm)   Pulse 72   Temp 99.5 F (37.5 C) (Oral)   Resp 15   SpO2 92%   Physical Exam Vitals signs and nursing note reviewed.  Constitutional:      General: She is not in acute distress.    Appearance: She is well-developed.  HENT:     Head: Normocephalic and atraumatic.  Eyes:     General:        Right eye: No discharge.        Left eye: No discharge.     Conjunctiva/sclera: Conjunctivae normal.  Neck:     Musculoskeletal: Neck supple.  Cardiovascular:  Rate and Rhythm: Normal rate and regular rhythm.     Heart sounds: Normal heart sounds. No murmur. No friction rub. No gallop.   Pulmonary:     Effort: Pulmonary effort is normal. No respiratory distress.     Breath sounds: Normal breath sounds.  Abdominal:     General: There is no distension.     Palpations: Abdomen is soft.     Tenderness: There is no abdominal tenderness.  Musculoskeletal:        General: No tenderness.     Right lower leg: Edema present.     Left lower leg: Edema present.     Comments: Mild TTP upper neck.   Skin:    General: Skin is warm and dry.  Neurological:     General: No focal deficit present.     Mental Status: She is alert.     Cranial Nerves: No cranial nerve deficit.     Sensory: No sensory deficit.     Motor: No weakness.     Coordination: Coordination normal.  Psychiatric:        Behavior: Behavior normal.        Thought Content: Thought content normal.      ED Treatments / Results  Labs (all labs ordered are listed, but only abnormal results are displayed) Labs Reviewed  URINE CULTURE - Abnormal; Notable for the following components:      Result Value   Culture >=100,000 COLONIES/mL KLEBSIELLA PNEUMONIAE (*)    Organism ID, Bacteria KLEBSIELLA PNEUMONIAE (*)    All other components within normal limits  CBC  WITH DIFFERENTIAL/PLATELET - Abnormal; Notable for the following components:   WBC 11.8 (*)    MCHC 29.6 (*)    Platelets 85 (*)    Neutro Abs 9.8 (*)    Abs Immature Granulocytes 0.11 (*)    All other components within normal limits  BASIC METABOLIC PANEL - Abnormal; Notable for the following components:   Potassium 5.3 (*)    Glucose, Bld 135 (*)    BUN 43 (*)    Creatinine, Ser 1.81 (*)    Calcium 8.7 (*)    GFR calc non Af Amer 25 (*)    GFR calc Af Amer 29 (*)    All other components within normal limits  URINALYSIS, ROUTINE W REFLEX MICROSCOPIC - Abnormal; Notable for the following components:   APPearance HAZY (*)    Leukocytes,Ua MODERATE (*)    Bacteria, UA MANY (*)    All other components within normal limits  SARS CORONAVIRUS 2 (TAT 6-24 HRS)    EKG None  Radiology Ct Head Wo Contrast  Result Date: 09/18/2019 CLINICAL DATA:  Fall, headache EXAM: CT HEAD WITHOUT CONTRAST CT CERVICAL SPINE WITHOUT CONTRAST TECHNIQUE: Multidetector CT imaging of the head and cervical spine was performed following the standard protocol without intravenous contrast. Multiplanar CT image reconstructions of the cervical spine were also generated. COMPARISON:  None. FINDINGS: CT HEAD FINDINGS Brain: No evidence of acute infarction, hemorrhage, hydrocephalus, extra-axial collection or mass lesion/mass effect. Periventricular white matter hypodensity. Vascular: No hyperdense vessel or unexpected calcification. Skull: Normal. Negative for fracture or focal lesion. Sinuses/Orbits: No acute finding. Other: None. CT CERVICAL SPINE FINDINGS Alignment: Degenerative straightening and reversal of the normal cervical lordosis. Skull base and vertebrae: No acute fracture. There is a nonacute, nondisplaced transverse type II fracture of the odontoid process. No primary bone lesion or focal pathologic process. Soft tissues and spinal canal: No prevertebral fluid or swelling. No  visible canal hematoma. Disc  levels: Moderate to severe multilevel disc space height loss and osteophytosis. Upper chest: Negative. Other: None. IMPRESSION: 1. No acute intracranial pathology. Small-vessel white matter disease. 2.  No acute fracture or static subluxation of the cervical spine. 3. There is a nonacute, nondisplaced transverse type II fracture of the odontoid process. 4. Moderate to severe multilevel disc space height loss and osteophytosis. Electronically Signed   By: Eddie Candle M.D.   On: 09/18/2019 16:44   Ct Cervical Spine Wo Contrast  Result Date: 09/18/2019 CLINICAL DATA:  Fall, headache EXAM: CT HEAD WITHOUT CONTRAST CT CERVICAL SPINE WITHOUT CONTRAST TECHNIQUE: Multidetector CT imaging of the head and cervical spine was performed following the standard protocol without intravenous contrast. Multiplanar CT image reconstructions of the cervical spine were also generated. COMPARISON:  None. FINDINGS: CT HEAD FINDINGS Brain: No evidence of acute infarction, hemorrhage, hydrocephalus, extra-axial collection or mass lesion/mass effect. Periventricular white matter hypodensity. Vascular: No hyperdense vessel or unexpected calcification. Skull: Normal. Negative for fracture or focal lesion. Sinuses/Orbits: No acute finding. Other: None. CT CERVICAL SPINE FINDINGS Alignment: Degenerative straightening and reversal of the normal cervical lordosis. Skull base and vertebrae: No acute fracture. There is a nonacute, nondisplaced transverse type II fracture of the odontoid process. No primary bone lesion or focal pathologic process. Soft tissues and spinal canal: No prevertebral fluid or swelling. No visible canal hematoma. Disc levels: Moderate to severe multilevel disc space height loss and osteophytosis. Upper chest: Negative. Other: None. IMPRESSION: 1. No acute intracranial pathology. Small-vessel white matter disease. 2.  No acute fracture or static subluxation of the cervical spine. 3. There is a nonacute, nondisplaced  transverse type II fracture of the odontoid process. 4. Moderate to severe multilevel disc space height loss and osteophytosis. Electronically Signed   By: Eddie Candle M.D.   On: 09/18/2019 16:44   Dg Chest Portable 1 View  Result Date: 09/18/2019 CLINICAL DATA:  Hypoxemia. Fell today. EXAM: PORTABLE CHEST 1 VIEW COMPARISON:  10/09/2016 FINDINGS: Stable enlarged cardiac silhouette. Clear lungs with normal vascularity. Thoracic spine degenerative changes. Stable old, healed left rib fractures. No acute fracture or pneumothorax seen. IMPRESSION: No acute abnormality. Stable cardiomegaly. Electronically Signed   By: Claudie Revering M.D.   On: 09/18/2019 16:42    Procedures Procedures (including critical care time)  Medications Ordered in ED Medications - No data to display   Initial Impression / Assessment and Plan / ED Course  I have reviewed the triage vital signs and the nursing notes.  Pertinent labs & imaging results that were available during my care of the patient were reviewed by me and considered in my medical decision making (see chart for details).        85 year old female with headache and neck pain after recent falls.  Imaging negative.  Sounds like falls mechanical.  She says that she just feels weak.  Neuro exam is nonfocal though.  Her legs are edematous, but she reports that this is chronic.  Per review of records, this does appear to be the case.  Suspect this is combination of advanced age, multiple underlying medical problems and general disability.  UA does look consistent with UTI.  Likely extremity or symptoms.  Will treat.  Given multiple recent falls discussed with son and patient admission versus discharge home.  I stood patient at the bedside.  She states that she feels reasonably safe going home at this time.  Return precautions were discussed.  Continue antibiotics otherwise.  Final Clinical Impressions(s) / ED Diagnoses   Final diagnoses:  Nonintractable  headache, unspecified chronicity pattern, unspecified headache type  Strain of neck muscle, initial encounter  Fall, initial encounter  Acute cystitis without hematuria    ED Discharge Orders    None       Virgel Manifold, MD 09/22/19 [phone]

## 2019-09-18 NOTE — ED Notes (Addendum)
Pt family member called out for pt vomiting. Pt had 2 episodes of vomiting after eating meal. OTD zofran giving pt currently not feeling nausea

## 2019-09-18 NOTE — ED Notes (Signed)
Patient transported to X-ray 

## 2019-09-18 NOTE — ED Triage Notes (Signed)
Patient presented to ed with c/o witness fall today. Patient state her legs fell asleep and went she try to get up she fell forward. Patient is c/o headache. Patient also fell on thru night after taking bendarly and melatonin and she got up had a fall. Patient is c/o neck pain from thru fall and headache from today's fall. No LOC.

## 2019-09-19 LAB — SARS CORONAVIRUS 2 (TAT 6-24 HRS): SARS Coronavirus 2: NEGATIVE

## 2019-09-20 ENCOUNTER — Encounter: Payer: Self-pay | Admitting: Internal Medicine

## 2019-09-20 ENCOUNTER — Other Ambulatory Visit: Payer: Self-pay

## 2019-09-20 ENCOUNTER — Encounter (HOSPITAL_COMMUNITY): Payer: Self-pay | Admitting: Emergency Medicine

## 2019-09-20 ENCOUNTER — Emergency Department (HOSPITAL_COMMUNITY): Payer: Medicare Other

## 2019-09-20 ENCOUNTER — Inpatient Hospital Stay (HOSPITAL_COMMUNITY): Payer: Medicare Other

## 2019-09-20 ENCOUNTER — Inpatient Hospital Stay (HOSPITAL_COMMUNITY)
Admission: EM | Admit: 2019-09-20 | Discharge: 2019-09-24 | DRG: 291 | Disposition: A | Discharge status: Skilled Nursing Facility | Payer: Medicare Other | Attending: Internal Medicine | Admitting: Internal Medicine

## 2019-09-20 DIAGNOSIS — L89312 Pressure ulcer of right buttock, stage 2: Secondary | ICD-10-CM | POA: Diagnosis present

## 2019-09-20 DIAGNOSIS — E669 Obesity, unspecified: Secondary | ICD-10-CM | POA: Diagnosis not present

## 2019-09-20 DIAGNOSIS — I959 Hypotension, unspecified: Secondary | ICD-10-CM | POA: Diagnosis not present

## 2019-09-20 DIAGNOSIS — N3000 Acute cystitis without hematuria: Secondary | ICD-10-CM | POA: Diagnosis present

## 2019-09-20 DIAGNOSIS — Z6834 Body mass index (BMI) 34.0-34.9, adult: Secondary | ICD-10-CM | POA: Diagnosis not present

## 2019-09-20 DIAGNOSIS — E0865 Diabetes mellitus due to underlying condition with hyperglycemia: Secondary | ICD-10-CM | POA: Diagnosis not present

## 2019-09-20 DIAGNOSIS — N183 Chronic kidney disease, stage 3 unspecified: Secondary | ICD-10-CM | POA: Diagnosis present

## 2019-09-20 DIAGNOSIS — J9601 Acute respiratory failure with hypoxia: Secondary | ICD-10-CM | POA: Diagnosis present

## 2019-09-20 DIAGNOSIS — I130 Hypertensive heart and chronic kidney disease with heart failure and stage 1 through stage 4 chronic kidney disease, or unspecified chronic kidney disease: Secondary | ICD-10-CM | POA: Diagnosis present

## 2019-09-20 DIAGNOSIS — N390 Urinary tract infection, site not specified: Secondary | ICD-10-CM | POA: Diagnosis not present

## 2019-09-20 DIAGNOSIS — W19XXXA Unspecified fall, initial encounter: Secondary | ICD-10-CM | POA: Diagnosis present

## 2019-09-20 DIAGNOSIS — Z743 Need for continuous supervision: Secondary | ICD-10-CM | POA: Diagnosis not present

## 2019-09-20 DIAGNOSIS — Z882 Allergy status to sulfonamides status: Secondary | ICD-10-CM

## 2019-09-20 DIAGNOSIS — E1122 Type 2 diabetes mellitus with diabetic chronic kidney disease: Secondary | ICD-10-CM | POA: Diagnosis present

## 2019-09-20 DIAGNOSIS — N1832 Chronic kidney disease, stage 3b: Secondary | ICD-10-CM | POA: Diagnosis present

## 2019-09-20 DIAGNOSIS — R410 Disorientation, unspecified: Secondary | ICD-10-CM | POA: Diagnosis not present

## 2019-09-20 DIAGNOSIS — S12110A Anterior displaced Type II dens fracture, initial encounter for closed fracture: Secondary | ICD-10-CM | POA: Diagnosis present

## 2019-09-20 DIAGNOSIS — E1151 Type 2 diabetes mellitus with diabetic peripheral angiopathy without gangrene: Secondary | ICD-10-CM | POA: Diagnosis present

## 2019-09-20 DIAGNOSIS — I10 Essential (primary) hypertension: Secondary | ICD-10-CM | POA: Diagnosis present

## 2019-09-20 DIAGNOSIS — B961 Klebsiella pneumoniae [K. pneumoniae] as the cause of diseases classified elsewhere: Secondary | ICD-10-CM | POA: Diagnosis present

## 2019-09-20 DIAGNOSIS — I110 Hypertensive heart disease with heart failure: Secondary | ICD-10-CM | POA: Diagnosis not present

## 2019-09-20 DIAGNOSIS — T383X5A Adverse effect of insulin and oral hypoglycemic [antidiabetic] drugs, initial encounter: Secondary | ICD-10-CM

## 2019-09-20 DIAGNOSIS — E782 Mixed hyperlipidemia: Secondary | ICD-10-CM | POA: Diagnosis present

## 2019-09-20 DIAGNOSIS — I5033 Acute on chronic diastolic (congestive) heart failure: Secondary | ICD-10-CM | POA: Diagnosis present

## 2019-09-20 DIAGNOSIS — E785 Hyperlipidemia, unspecified: Secondary | ICD-10-CM | POA: Diagnosis not present

## 2019-09-20 DIAGNOSIS — G4489 Other headache syndrome: Secondary | ICD-10-CM | POA: Diagnosis not present

## 2019-09-20 DIAGNOSIS — E160 Drug-induced hypoglycemia without coma: Secondary | ICD-10-CM | POA: Diagnosis not present

## 2019-09-20 DIAGNOSIS — M419 Scoliosis, unspecified: Secondary | ICD-10-CM | POA: Diagnosis present

## 2019-09-20 DIAGNOSIS — E1165 Type 2 diabetes mellitus with hyperglycemia: Secondary | ICD-10-CM | POA: Diagnosis present

## 2019-09-20 DIAGNOSIS — Z23 Encounter for immunization: Secondary | ICD-10-CM

## 2019-09-20 DIAGNOSIS — S161XXA Strain of muscle, fascia and tendon at neck level, initial encounter: Secondary | ICD-10-CM | POA: Diagnosis present

## 2019-09-20 DIAGNOSIS — I129 Hypertensive chronic kidney disease with stage 1 through stage 4 chronic kidney disease, or unspecified chronic kidney disease: Secondary | ICD-10-CM | POA: Diagnosis present

## 2019-09-20 DIAGNOSIS — R0902 Hypoxemia: Secondary | ICD-10-CM | POA: Diagnosis not present

## 2019-09-20 DIAGNOSIS — Z66 Do not resuscitate: Secondary | ICD-10-CM | POA: Diagnosis present

## 2019-09-20 DIAGNOSIS — R55 Syncope and collapse: Secondary | ICD-10-CM | POA: Diagnosis present

## 2019-09-20 DIAGNOSIS — R0602 Shortness of breath: Secondary | ICD-10-CM | POA: Diagnosis not present

## 2019-09-20 DIAGNOSIS — N189 Chronic kidney disease, unspecified: Secondary | ICD-10-CM | POA: Diagnosis present

## 2019-09-20 DIAGNOSIS — N179 Acute kidney failure, unspecified: Secondary | ICD-10-CM | POA: Diagnosis present

## 2019-09-20 DIAGNOSIS — Z79899 Other long term (current) drug therapy: Secondary | ICD-10-CM

## 2019-09-20 DIAGNOSIS — L8990 Pressure ulcer of unspecified site, unspecified stage: Secondary | ICD-10-CM | POA: Diagnosis present

## 2019-09-20 DIAGNOSIS — G9341 Metabolic encephalopathy: Secondary | ICD-10-CM | POA: Diagnosis present

## 2019-09-20 DIAGNOSIS — S12112D Nondisplaced Type II dens fracture, subsequent encounter for fracture with routine healing: Secondary | ICD-10-CM | POA: Diagnosis not present

## 2019-09-20 DIAGNOSIS — Z888 Allergy status to other drugs, medicaments and biological substances status: Secondary | ICD-10-CM

## 2019-09-20 DIAGNOSIS — I5032 Chronic diastolic (congestive) heart failure: Secondary | ICD-10-CM | POA: Diagnosis present

## 2019-09-20 DIAGNOSIS — R279 Unspecified lack of coordination: Secondary | ICD-10-CM | POA: Diagnosis not present

## 2019-09-20 DIAGNOSIS — R402411 Glasgow coma scale score 13-15, in the field [EMT or ambulance]: Secondary | ICD-10-CM | POA: Diagnosis not present

## 2019-09-20 DIAGNOSIS — Z7982 Long term (current) use of aspirin: Secondary | ICD-10-CM

## 2019-09-20 DIAGNOSIS — E1149 Type 2 diabetes mellitus with other diabetic neurological complication: Secondary | ICD-10-CM | POA: Diagnosis present

## 2019-09-20 DIAGNOSIS — E1142 Type 2 diabetes mellitus with diabetic polyneuropathy: Secondary | ICD-10-CM | POA: Diagnosis present

## 2019-09-20 DIAGNOSIS — I5031 Acute diastolic (congestive) heart failure: Secondary | ICD-10-CM | POA: Diagnosis present

## 2019-09-20 DIAGNOSIS — R404 Transient alteration of awareness: Secondary | ICD-10-CM | POA: Diagnosis not present

## 2019-09-20 DIAGNOSIS — Z20828 Contact with and (suspected) exposure to other viral communicable diseases: Secondary | ICD-10-CM | POA: Diagnosis present

## 2019-09-20 DIAGNOSIS — R4020 Unspecified coma: Secondary | ICD-10-CM | POA: Diagnosis not present

## 2019-09-20 DIAGNOSIS — I509 Heart failure, unspecified: Secondary | ICD-10-CM

## 2019-09-20 DIAGNOSIS — R52 Pain, unspecified: Secondary | ICD-10-CM | POA: Diagnosis not present

## 2019-09-20 DIAGNOSIS — Z823 Family history of stroke: Secondary | ICD-10-CM

## 2019-09-20 DIAGNOSIS — Z794 Long term (current) use of insulin: Secondary | ICD-10-CM

## 2019-09-20 DIAGNOSIS — E1169 Type 2 diabetes mellitus with other specified complication: Secondary | ICD-10-CM | POA: Diagnosis present

## 2019-09-20 DIAGNOSIS — E11649 Type 2 diabetes mellitus with hypoglycemia without coma: Secondary | ICD-10-CM | POA: Diagnosis present

## 2019-09-20 DIAGNOSIS — I2510 Atherosclerotic heart disease of native coronary artery without angina pectoris: Secondary | ICD-10-CM | POA: Diagnosis present

## 2019-09-20 DIAGNOSIS — E6609 Other obesity due to excess calories: Secondary | ICD-10-CM | POA: Diagnosis present

## 2019-09-20 DIAGNOSIS — Z8249 Family history of ischemic heart disease and other diseases of the circulatory system: Secondary | ICD-10-CM

## 2019-09-20 LAB — CBC WITH DIFFERENTIAL/PLATELET
Abs Immature Granulocytes: 0.16 10*3/uL — ABNORMAL HIGH (ref 0.00–0.07)
Basophils Absolute: 0 10*3/uL (ref 0.0–0.1)
Basophils Relative: 0 %
Eosinophils Absolute: 0 10*3/uL (ref 0.0–0.5)
Eosinophils Relative: 0 %
HCT: 39.3 % (ref 36.0–46.0)
Hemoglobin: 12.1 g/dL (ref 12.0–15.0)
Immature Granulocytes: 1 %
Lymphocytes Relative: 8 %
Lymphs Abs: 1 10*3/uL (ref 0.7–4.0)
MCH: 27.4 pg (ref 26.0–34.0)
MCHC: 30.8 g/dL (ref 30.0–36.0)
MCV: 89.1 fL (ref 80.0–100.0)
Monocytes Absolute: 0.7 10*3/uL (ref 0.1–1.0)
Monocytes Relative: 5 %
Neutro Abs: 11.5 10*3/uL — ABNORMAL HIGH (ref 1.7–7.7)
Neutrophils Relative %: 86 %
Platelets: 235 10*3/uL (ref 150–400)
RBC: 4.41 MIL/uL (ref 3.87–5.11)
RDW: 13.9 % (ref 11.5–15.5)
WBC: 13.4 10*3/uL — ABNORMAL HIGH (ref 4.0–10.5)
nRBC: 0.1 % (ref 0.0–0.2)

## 2019-09-20 LAB — SARS CORONAVIRUS 2 (TAT 6-24 HRS): SARS Coronavirus 2: NEGATIVE

## 2019-09-20 LAB — GLUCOSE, CAPILLARY: Glucose-Capillary: 164 mg/dL — ABNORMAL HIGH (ref 70–99)

## 2019-09-20 LAB — COMPREHENSIVE METABOLIC PANEL
ALT: 17 U/L (ref 0–44)
AST: 20 U/L (ref 15–41)
Albumin: 3.2 g/dL — ABNORMAL LOW (ref 3.5–5.0)
Alkaline Phosphatase: 74 U/L (ref 38–126)
Anion gap: 17 — ABNORMAL HIGH (ref 5–15)
BUN: 51 mg/dL — ABNORMAL HIGH (ref 8–23)
CO2: 24 mmol/L (ref 22–32)
Calcium: 8.8 mg/dL — ABNORMAL LOW (ref 8.9–10.3)
Chloride: 92 mmol/L — ABNORMAL LOW (ref 98–111)
Creatinine, Ser: 1.8 mg/dL — ABNORMAL HIGH (ref 0.44–1.00)
GFR calc Af Amer: 29 mL/min — ABNORMAL LOW (ref >60)
GFR calc non Af Amer: 25 mL/min — ABNORMAL LOW (ref >60)
Glucose, Bld: 303 mg/dL — ABNORMAL HIGH (ref 70–99)
Potassium: 5.6 mmol/L — ABNORMAL HIGH (ref 3.5–5.1)
Sodium: 133 mmol/L — ABNORMAL LOW (ref 135–145)
Total Bilirubin: 0.6 mg/dL (ref 0.3–1.2)
Total Protein: 6.3 g/dL — ABNORMAL LOW (ref 6.5–8.1)

## 2019-09-20 LAB — BRAIN NATRIURETIC PEPTIDE: B Natriuretic Peptide: 1277.5 pg/mL — ABNORMAL HIGH (ref 0.0–100.0)

## 2019-09-20 LAB — TROPONIN I (HIGH SENSITIVITY): Troponin I (High Sensitivity): 54 ng/L — ABNORMAL HIGH (ref <18)

## 2019-09-20 MED ORDER — INSULIN ASPART 100 UNIT/ML ~~LOC~~ SOLN
0.0000 [IU] | Freq: Three times a day (TID) | SUBCUTANEOUS | Status: DC
  Administered 2019-09-21: 2 [IU] via SUBCUTANEOUS
  Administered 2019-09-23 (×2): 1 [IU] via SUBCUTANEOUS
  Administered 2019-09-24: 2 [IU] via SUBCUTANEOUS

## 2019-09-20 MED ORDER — SODIUM CHLORIDE 0.9 % IV SOLN
1.0000 g | Freq: Once | INTRAVENOUS | Status: AC
  Administered 2019-09-20: 1 g via INTRAVENOUS
  Filled 2019-09-20: qty 10

## 2019-09-20 MED ORDER — GABAPENTIN 300 MG PO CAPS
300.0000 mg | ORAL_CAPSULE | Freq: Every day | ORAL | Status: DC
  Administered 2019-09-20 – 2019-09-23 (×4): 300 mg via ORAL
  Filled 2019-09-20 (×4): qty 1

## 2019-09-20 MED ORDER — ACETAMINOPHEN 325 MG PO TABS
650.0000 mg | ORAL_TABLET | ORAL | Status: DC | PRN
  Administered 2019-09-20 – 2019-09-23 (×4): 650 mg via ORAL
  Filled 2019-09-20 (×5): qty 2

## 2019-09-20 MED ORDER — AMLODIPINE BESYLATE 5 MG PO TABS
5.0000 mg | ORAL_TABLET | Freq: Every day | ORAL | Status: DC
  Administered 2019-09-21 – 2019-09-24 (×4): 5 mg via ORAL
  Filled 2019-09-20 (×5): qty 1

## 2019-09-20 MED ORDER — SODIUM CHLORIDE 0.9% FLUSH: 3.0000 mL | INTRAVENOUS | Status: DC | PRN

## 2019-09-20 MED ORDER — FUROSEMIDE 10 MG/ML IJ SOLN
60.0000 mg | Freq: Once | INTRAMUSCULAR | Status: AC
  Administered 2019-09-20: 60 mg via INTRAVENOUS
  Filled 2019-09-20: qty 6

## 2019-09-20 MED ORDER — METOPROLOL TARTRATE 25 MG PO TABS
25.0000 mg | ORAL_TABLET | Freq: Two times a day (BID) | ORAL | Status: DC
  Administered 2019-09-20 – 2019-09-24 (×8): 25 mg via ORAL
  Filled 2019-09-20 (×8): qty 1

## 2019-09-20 MED ORDER — SODIUM CHLORIDE 0.9 % IV SOLN
1.0000 g | INTRAVENOUS | Status: DC
  Administered 2019-09-21: 13:00:00 1 g via INTRAVENOUS
  Filled 2019-09-20: qty 1
  Filled 2019-09-20: qty 10

## 2019-09-20 MED ORDER — SODIUM CHLORIDE 0.9% FLUSH
3.0000 mL | Freq: Two times a day (BID) | INTRAVENOUS | Status: DC
  Administered 2019-09-21 – 2019-09-23 (×7): 3 mL via INTRAVENOUS

## 2019-09-20 MED ORDER — SODIUM CHLORIDE 0.9 % IV SOLN: 250.0000 mL | INTRAVENOUS | Status: DC | PRN

## 2019-09-20 MED ORDER — HYDRALAZINE HCL 25 MG PO TABS
25.0000 mg | ORAL_TABLET | Freq: Two times a day (BID) | ORAL | Status: DC
  Administered 2019-09-20 – 2019-09-24 (×8): 25 mg via ORAL
  Filled 2019-09-20 (×9): qty 1

## 2019-09-20 MED ORDER — FUROSEMIDE 10 MG/ML IJ SOLN
80.0000 mg | Freq: Every day | INTRAMUSCULAR | Status: DC
  Administered 2019-09-21 – 2019-09-23 (×3): 80 mg via INTRAVENOUS
  Filled 2019-09-20 (×4): qty 8

## 2019-09-20 MED ORDER — INSULIN GLARGINE 100 UNIT/ML ~~LOC~~ SOLN
20.0000 [IU] | Freq: Every day | SUBCUTANEOUS | Status: DC
  Administered 2019-09-21: 20 [IU] via SUBCUTANEOUS
  Filled 2019-09-20 (×4): qty 0.2

## 2019-09-20 MED ORDER — ATORVASTATIN CALCIUM 40 MG PO TABS
40.0000 mg | ORAL_TABLET | Freq: Every morning | ORAL | Status: DC
  Administered 2019-09-21 – 2019-09-24 (×4): 40 mg via ORAL
  Filled 2019-09-20 (×5): qty 1

## 2019-09-20 MED ORDER — HEPARIN SODIUM (PORCINE) 5000 UNIT/ML IJ SOLN
5000.0000 [IU] | Freq: Three times a day (TID) | INTRAMUSCULAR | Status: DC
  Administered 2019-09-20 – 2019-09-24 (×12): 5000 [IU] via SUBCUTANEOUS
  Filled 2019-09-20 (×12): qty 1

## 2019-09-20 MED ORDER — ASPIRIN EC 81 MG PO TBEC
81.0000 mg | DELAYED_RELEASE_TABLET | Freq: Every morning | ORAL | Status: DC
  Administered 2019-09-21 – 2019-09-24 (×4): 81 mg via ORAL
  Filled 2019-09-20 (×5): qty 1

## 2019-09-20 MED ORDER — ACETAMINOPHEN 325 MG PO TABS: 650.0000 mg | ORAL_TABLET | Freq: Four times a day (QID) | ORAL | Status: DC | PRN

## 2019-09-20 MED ORDER — ONDANSETRON HCL 4 MG/2ML IJ SOLN
4.0000 mg | Freq: Four times a day (QID) | INTRAMUSCULAR | Status: DC | PRN
  Administered 2019-09-20 – 2019-09-24 (×3): 4 mg via INTRAVENOUS
  Filled 2019-09-20 (×2): qty 2

## 2019-09-20 MED ORDER — TRAMADOL HCL 50 MG PO TABS
50.0000 mg | ORAL_TABLET | Freq: Three times a day (TID) | ORAL | Status: DC | PRN
  Administered 2019-09-21 – 2019-09-22 (×3): 50 mg via ORAL
  Filled 2019-09-20 (×3): qty 1

## 2019-09-20 MED ORDER — FUROSEMIDE 10 MG/ML IJ SOLN: 80.0000 mg | Freq: Once | INTRAMUSCULAR | Status: DC

## 2019-09-20 NOTE — ED Provider Notes (Signed)
[facility] EMERGENCY DEPARTMENT Provider Note   CSN: [id number] Arrival date & time: 09/20/19  1223     History   Chief Complaint Chief Complaint  Patient presents with   Altered Mental Status   Shortness of Breath   Emesis    HPI [name] is a 85 y.o. female past medical Struve CKD, CHF, CAD, diabetes, hypertension who presents for evaluation via EMS for altered mental status, difficulty breathing.  Per son, patient was seen in the ED on 09/18/2019 after concerns for fall.  Patient was found to have UTI at that time is discharged home with antibiotics.  Son called EMS today because of worsening confusion, fatigue and difficulty breathing.  Patient states she has been having difficulty breathing for a while now.  She states that she does not have any oxygen requirement at home.  She has not noted any cough.  Patient states that she has maybe had some intermittent chest pain but denies any current chest pain.  She states she has not had any abdominal pain.  Son states that she is usually more alert and oriented than she is now.  He states that she has been taking antibiotics and has not missed any doses.  Patient initially told me that she was missing some doses of her Lasix but son states that she is taking it.     The history is provided by the patient and a relative.    Past Medical History:  Diagnosis Date   Anasarca 04/2009   found to be secondary to pericardial effusion with tamponade/pericardial window done   Chronic diastolic CHF (congestive heart failure) (HCC)    CKD (chronic kidney disease), stage III    Coronary artery disease    a. mild-mod CAD 2004.   Diabetes mellitus    insulin dependent   Herpes labialis    Healing herpes labialis   Hypercholesterolemia    Hypertension    Lumbar radiculopathy    Lumbar scoliosis    Morbid obesity ([place])    Pericardial effusion 04/2009   a. 2010 with tamponade s/p window.   PONV  (postoperative nausea and vomiting)    has not had any problems since 2001   Spondylolisthesis    Spondylosis     Patient Active Problem List   Diagnosis Date Noted   Acute diastolic heart failure ([place]) 09/20/2019   Dysuria 08/07/2018   Anemia of chronic renal failure, stage 2 (mild) 02/18/2017   Diabetic polyneuropathy associated with type 2 diabetes mellitus ([place]) 02/18/2017   Vitamin D deficiency 02/18/2017   Pulmonary hypertension (HCC)    CKD (chronic kidney disease) stage 3, GFR 30-59 ml/min    Cough    Hypertensive retinopathy of both eyes 09/26/2015   Pseudophakia of both eyes 09/26/2015   Controlled type 2 diabetes mellitus with both eyes affected by moderate nonproliferative retinopathy without macular edema, with long-term current use of insulin (HCC) 06/07/2014   Chronic diastolic CHF (congestive heart failure) ([place]) 03/18/2011   Mixed dyslipidemia 03/18/2011   Hypertension, essential     Past Surgical History:  Procedure Laterality Date   BACK SURGERY     microdiskectomy L3-4 on L/partial facetectomy 3-4 on L/removal of synovial cyst on left L3-4   BREAST BIOPSY     left   CARDIAC CATHETERIZATION  04/25/2003   L heart cath w/coronary angiography/R femoral artery/ EF 65%/no mitral regurgitation/ angiography L main coronary artery smooth & normal   EYE SURGERY  ophthalmoscopy/peritomy adjacent to the limbus from the 8 to 2:30 position superiorly   lumbar laminectomy     SUBXYPHOID PERICARDIAL WINDOW  04/2009   for pericardial effusion w/pericardial tamponade/sac drained w/20-French Baard drain/transesophageal echocardiogram confimed complete evacution of pericardial fluid   TOTAL HIP ARTHROPLASTY  03/06/2012   Procedure: TOTAL HIP ARTHROPLASTY;  Surgeon: Kerin Salen, MD;  Location: [facility];  Service: Orthopedics;  Laterality: Left;  DEPUY/PINNACLE, SUMMIT STEM, CUP POLY & CERAMIC   VAGINAL HYSTERECTOMY       OB History   No obstetric  history on file.      Home Medications    Prior to Admission medications   Medication Sig Start Date End Date Taking? Authorizing Provider  acetaminophen (TYLENOL) 500 MG tablet Take 500 mg by mouth See admin instructions. Take one tablet (500 mg) by mouth twice daily, may also take one tablet (500 mg) mid-day as needed for pain/headache   Yes [provider]  amLODipine (NORVASC) 5 MG tablet TAKE ONE TABLET BY MOUTH DAILY Patient taking differently: Take 5 mg by mouth daily.  08/23/19  Yes Nahser, Wonda Cheng, MD  aspirin EC 81 MG tablet Take 81 mg by mouth every morning.    Yes [provider]  atorvastatin (LIPITOR) 40 MG tablet Take 40 mg by mouth every morning.  12/31/18  Yes [provider]  cephALEXin (KEFLEX) 500 MG capsule Take 1 capsule (500 mg total) by mouth 3 (three) times daily. 09/18/19  Yes Virgel Manifold, MD  diphenhydramine-acetaminophen (TYLENOL PM) 25-500 MG TABS tablet Take 1 tablet by mouth at bedtime.   Yes [provider]  ergocalciferol (VITAMIN D2) 50000 UNITS capsule Take 50,000 Units by mouth every Friday.  07/30/11  Yes [provider]  fluticasone (FLONASE) 50 MCG/ACT nasal spray Place 2 sprays into both nostrils daily. Patient taking differently: Place 2 sprays into both nostrils daily as needed for allergies or rhinitis.  10/15/16  Yes Tat, Shanon Brow, MD  furosemide (LASIX) 80 MG tablet TAKE 1 TABLET BY MOUTH EVERY DAY Patient taking differently: Take 80 mg by mouth every morning.  08/09/19  Yes Nahser, Wonda Cheng, MD  gabapentin (NEURONTIN) 300 MG capsule 2 TABLETS BY MOUTH AT BEDTIME Patient taking differently: Take 600 mg by mouth at bedtime.  08/06/16  Yes Nahser, Wonda Cheng, MD  glipiZIDE (GLUCOTROL XL) 5 MG 24 hr tablet Take 5 mg by mouth every morning.   Yes [provider]  hydrALAZINE (APRESOLINE) 25 MG tablet TAKE 1 TABLET BY MOUTH TWICE A DAY Patient taking differently: Take 25 mg by mouth 2 (two) times daily.   07/21/19  Yes Nahser, Wonda Cheng, MD  insulin aspart (NOVOLOG) 100 UNIT/ML injection Inject 3-5 Units into the skin 3 (three) times daily with meals. Based on a sliding scale.   Yes [provider]  insulin glargine (LANTUS) 100 UNIT/ML injection Inject 25 Units into the skin at bedtime.    Yes [provider]  LACTASE PO Take 1 tablet by mouth 3 (three) times daily as needed (if meal includes cheese or other dairy).   Yes [provider]  metoprolol tartrate (LOPRESSOR) 25 MG tablet Take 25 mg by mouth 2 (two) times daily.   Yes [provider]  Multiple Vitamin (MULTIVITAMIN WITH MINERALS) TABS tablet Take 1 tablet by mouth daily.   Yes [provider]  oxyCODONE (OXY IR/ROXICODONE) 5 MG immediate release tablet Take 5 mg by mouth daily as needed for severe pain.   Yes  [provider]  Polyethyl Glycol-Propyl Glycol (SYSTANE) 0.4-0.3 % SOLN Apply 2 drops to eye daily as needed (dry eyes).    Yes [provider]  traMADol (ULTRAM) 50 MG tablet Take 1 tablet (50 mg total) by mouth every 8 (eight) hours as needed. Patient taking differently: Take 50 mg by mouth every 8 (eight) hours as needed (pain).  09/18/19  Yes Virgel Manifold, MD  ALPRAZolam Duanne Moron) 0.25 MG tablet Take 1 tablet (0.25 mg total) by mouth daily as needed for anxiety. Patient not taking: Reported on 09/20/2019 08/12/17   Hoyt Koch, MD  ONE [facility] ULTRA TEST test strip Use as directed 01/13/19   [provider]    Family History Family History  Problem Relation Age of Onset   Heart attack Father    Hypertension Father    Stroke Mother    Angina Mother    Coronary artery disease Other     Social History Social History   Tobacco Use   Smoking status: Never Smoker   Smokeless tobacco: Never Used  Substance Use Topics   Alcohol use: No   Drug use: No     Allergies   Atenolol, Codeine, Lactose intolerance (gi), Lodine [etodolac],  Percocet [oxycodone-acetaminophen], Sulfa drugs cross reactors, Sulfamethoxazole, Benzonatate, Escitalopram oxalate, Metformin, Oxycodone-acetaminophen, and Pregabalin   Review of Systems Review of Systems  Constitutional: Negative for fever.  Respiratory: Positive for shortness of breath. Negative for cough.   Cardiovascular: Negative for chest pain.  Gastrointestinal: Negative for abdominal pain, nausea and vomiting.  Genitourinary: Negative for dysuria and hematuria.  Neurological: Negative for headaches.  Psychiatric/Behavioral: Positive for confusion.  All other systems reviewed and are negative.    Physical Exam Updated Vital Signs BP 137/69    Pulse 71    Temp 97.7 F (36.5 C) (Rectal)    Resp 20    Ht 5\' 4"  (1.626 m)    Wt 90.7 kg    SpO2 92%    BMI 34.33 kg/m   Physical Exam Vitals signs and nursing note reviewed.  Constitutional:      Appearance: Normal appearance. She is well-developed.  HENT:     Head: Normocephalic and atraumatic.  Eyes:     General: Lids are normal.     Conjunctiva/sclera: Conjunctivae normal.     Pupils: Pupils are equal, round, and reactive to light.  Neck:     Musculoskeletal: Full passive range of motion without pain.  Cardiovascular:     Rate and Rhythm: Normal rate and regular rhythm.     Pulses: Normal pulses.          Radial pulses are 2+ on the right side and 2+ on the left side.     Heart sounds: Normal heart sounds. No murmur. No friction rub. No gallop.   Pulmonary:     Effort: Pulmonary effort is normal. Tachypnea present.     Comments: Increased work of breathing noted.  Rales noted bilaterally to about the mid lung fields. Abdominal:     Palpations: Abdomen is soft. Abdomen is not rigid.     Tenderness: There is no abdominal tenderness. There is no guarding.     Comments: Abdomen is soft, non-distended, non-tender. No rigidity, No guarding. No peritoneal signs.  Musculoskeletal: Normal range of motion.     Comments: 1+  pitting edema noted from mid tib-fib that extends distally.  No overlying warmth, erythema.  Skin:    General: Skin is warm and dry.  Capillary Refill: Capillary refill takes less than 2 seconds.  Neurological:     Mental Status: She is alert.     Comments: Alert and oriented x2.  She does not know what year it is. Follows commands and moves all extremities spontaneously.  Psychiatric:        Speech: Speech normal.      ED Treatments / Results  Labs (all labs ordered are listed, but only abnormal results are displayed) Labs Reviewed  COMPREHENSIVE METABOLIC PANEL - Abnormal; Notable for the following components:      Result Value   Sodium 133 (*)    Potassium 5.6 (*)    Chloride 92 (*)    Glucose, Bld 303 (*)    BUN 51 (*)    Creatinine, Ser 1.80 (*)    Calcium 8.8 (*)    Total Protein 6.3 (*)    Albumin 3.2 (*)    GFR calc non Af Amer 25 (*)    GFR calc Af Amer 29 (*)    Anion gap 17 (*)    All other components within normal limits  CBC WITH DIFFERENTIAL/PLATELET - Abnormal; Notable for the following components:   WBC 13.4 (*)    Neutro Abs 11.5 (*)    Abs Immature Granulocytes 0.16 (*)    All other components within normal limits  BRAIN NATRIURETIC PEPTIDE - Abnormal; Notable for the following components:   B Natriuretic Peptide 1,277.5 (*)    All other components within normal limits  TROPONIN I (HIGH SENSITIVITY) - Abnormal; Notable for the following components:   Troponin I (High Sensitivity) 54 (*)    All other components within normal limits  SARS CORONAVIRUS 2 (TAT 6-24 HRS)  CULTURE, BLOOD (ROUTINE X 2)  CULTURE, BLOOD (ROUTINE X 2)  URINALYSIS, ROUTINE W REFLEX MICROSCOPIC  TROPONIN I (HIGH SENSITIVITY)    EKG None  Radiology Dg Chest Portable 1 View  Result Date: 09/20/2019 CLINICAL DATA:  85 year old female with shortness of breath. EXAM: PORTABLE CHEST 1 VIEW COMPARISON:  Chest radiograph dated 09/18/2019. FINDINGS: Mild cardiomegaly with mild  vascular congestion. No focal consolidation or pneumothorax. No significant pleural effusion. Atherosclerotic calcification of the aorta. No acute osseous pathology. IMPRESSION: Mild cardiomegaly with mild vascular congestion. No focal consolidation. Electronically Signed   By: Anner Crete M.D.   On: 09/20/2019 14:04    Procedures Procedures (including critical care time)  Medications Ordered in ED Medications  cefTRIAXone (ROCEPHIN) 1 g in sodium chloride 0.9 % 100 mL IVPB (0 g Intravenous Stopped 09/20/19 1543)  furosemide (LASIX) injection 60 mg (60 mg Intravenous Given 09/20/19 1544)     Initial Impression / Assessment and Plan / ED Course  I have reviewed the triage vital signs and the nursing notes.  Pertinent labs & imaging results that were available during my care of the patient were reviewed by me and considered in my medical decision making (see chart for details).        85 year old female who presents for evaluation of concerns for confusion and shortness of breath.  Recently seen in the ED on 09/18/2019 and was diagnosed with UTI.  Son reports that since then, she has been more confused, more fatigued.  No fevers at home.  Initial ED arrival, she is afebrile.  Her oxygen levels are hypoxic at 71% on room air.  She was placed on 3 L/min and that increase her to 92%.  On my initial evaluation, she was able to answer my questions though she  did not know what year it was.  She did not seem altered or confused.  When son came, he stated that patient was not as alert as she normally is.  She was recently diagnosed with UTI after being seen at [facility] on 09/18/2019.  She was discharged home with antibiotics which son states she has been compliant with.  Patient told me she had missed a few doses of her Lasix but patient son thinks she has been taking it off.  Concern for infectious etiology versus CHF exacerbation given hypoxia, crackles noted on lung bases.  We will plan to  check labs, chest x-ray.  BNP is elevated at 1277.5.  CBC shows leukocytosis of 13.4 which is increased from 2 days ago.  CMP shows sodium 133, potassium of 5.6, BUN of 51, creatinine of 1.0, bicarb of 24. Potassium was 5.3 two days ago.  Troponin is slightly elevated.  I suspect this is from demand ischemia from heart failure exacerbation.  She currently has no chest pain.  CXR shows cardiomegaly with mild vascular congestion.  Given elevated BNP, congestion, patient presentation, concern for acute CHF exacerbation.  We will plan to give diuretics.  Additionally I reviewed her urine culture that was sent from her previous ED visit that showed 100,000 colonies of Klebsiella.  We will plan to treat her with IV antibiotics.  Given hypoxia, confusion, will need admission.  Discussed patient with Dr. Lonny Prude (hospitalist). Will admit.   Portions of this note were generated with [employer]. Dictation errors may occur despite best attempts at proofreading.   Final Clinical Impressions(s) / ED Diagnoses   Final diagnoses:  Acute on chronic congestive heart failure, unspecified heart failure type [facility])  Acute cystitis without hematuria    ED Discharge Orders    None       Volanda Napoleon, PA-C 09/20/19 1702    Sherwood Gambler, MD 09/21/19 [phone]

## 2019-09-20 NOTE — ED Triage Notes (Signed)
Pt arrives via EMS from home with increased confusion since Sat. Recently diagnosed with UTI. Pt normally able to walk with walker. REcently unable to get out of bed, take medications. Alert to year, person, place. Vital 142/64, HR 74, NSR cbg 324, rr 20, has hx of COPD, 4L 94%. 20 g LAC. Does not wear O2 at home RA sat 71%.

## 2019-09-20 NOTE — Progress Notes (Signed)
We were called to review the imaging on this 85 year old female with multiple comorbidities who had a CT scan of the cervical spine done 2 days ago when in the emergency department after a mechanical fall.  She returns today with mental status changes.  This is an old patient of Dr. Melven Sartorius who underwent a lumbar fusion in 2012 who had a mechanical fall a couple of days ago.  CT scan from 2 days ago shows an old type II odontoid fracture that is nondisplaced.  Given her comorbidities she is not a candidate for surgical intervention and therefore should be treated in a soft cervical collar versus a rigid cervical orthosis which may be less well-tolerated.  She is welcome to follow-up with Dr. Vertell Limber as an outpatient, but I am not sure he is going to have much to offer from a surgical perspective in regards to the odontoid fracture.

## 2019-09-20 NOTE — H&P (Signed)
History and Physical    [name] [id number] DOB: 1931/05/31 DOA: 09/20/2019  PCP: Hoyt Koch, MD Patient coming from: Home  Chief Complaint: Confusion  HPI: [name] is a 85 y.o. female with medical history significant of CHF, CKD stage III, CAD, diabetes, morbid obesity.  History provided by the patient's son as the patient was unsure of why she was in the hospital.  Patient presented secondary to 2 days of worsening confusion.  Confusion has gotten so bad that she does not recognize her surroundings or herself.  Patient was recently seen for a fall in addition to being diagnosed with a UTI.  She was receiving Keflex for UTI.  Per son, patient has been giving herself insulin for the past few days as she normally does.  ED Course: Vitals: Afebrile, pulse of 65, respirations of 20, BP of 137/69, on 3 L via nasal cannula Labs: Sodium of 133, potassium of 5.6, chloride of 92, glucose of 303, creatinine 1.8, calcium of 8.8, albumin of 3.2, BNP of 1277, high-sensitivity troponin of 54 Imaging: Chest x-ray significant for cardiomegaly with vascular congestion. Medications/Course: Lasix IV, ceftriaxone IV  Review of Systems: Review of Systems  Constitutional: Negative for chills and fever.  Respiratory: Positive for shortness of breath. Negative for cough, sputum production and wheezing.   Cardiovascular: Positive for leg swelling. Negative for chest pain and orthopnea.  Gastrointestinal: Negative for abdominal pain, constipation, diarrhea, nausea and vomiting.  Neurological: Positive for weakness.  All other systems reviewed and are negative.   Past Medical History:  Diagnosis Date  . Anasarca 04/2009   found to be secondary to pericardial effusion with tamponade/pericardial window done  . Chronic diastolic CHF (congestive heart failure) ([place])   . CKD (chronic kidney disease), stage III   . Coronary artery disease    a. mild-mod CAD 2004.  . Diabetes mellitus    insulin dependent  . Herpes labialis    Healing herpes labialis  . Hypercholesterolemia   . Hypertension   . Lumbar radiculopathy   . Lumbar scoliosis   . Morbid obesity ([place])   . Pericardial effusion 04/2009   a. 2010 with tamponade s/p window.  Marland Kitchen PONV (postoperative nausea and vomiting)    has not had any problems since 2001  . Spondylolisthesis   . Spondylosis     Past Surgical History:  Procedure Laterality Date  . BACK SURGERY     microdiskectomy L3-4 on L/partial facetectomy 3-4 on L/removal of synovial cyst on left L3-4  . BREAST BIOPSY     left  . CARDIAC CATHETERIZATION  04/25/2003   L heart cath w/coronary angiography/R femoral artery/ EF 65%/no mitral regurgitation/ angiography L main coronary artery smooth & normal  . EYE SURGERY     ophthalmoscopy/peritomy adjacent to the limbus from the 8 to 2:30 position superiorly  . lumbar laminectomy    . SUBXYPHOID PERICARDIAL WINDOW  04/2009   for pericardial effusion w/pericardial tamponade/sac drained w/20-French Baard drain/transesophageal echocardiogram confimed complete evacution of pericardial fluid  . TOTAL HIP ARTHROPLASTY  03/06/2012   Procedure: TOTAL HIP ARTHROPLASTY;  Surgeon: Kerin Salen, MD;  Location: [facility];  Service: Orthopedics;  Laterality: Left;  DEPUY/PINNACLE, SUMMIT STEM, CUP POLY & CERAMIC  . VAGINAL HYSTERECTOMY       reports that she has never smoked. She has never used smokeless tobacco. She reports that she does not drink alcohol or use drugs.  Allergies  Allergen Reactions  . Atenolol Other (See Comments)  weakness  . Codeine Nausea And Vomiting    Hydrocodone is ok  . Lactose Intolerance (Gi) Diarrhea  . Lodine [Etodolac] Other (See Comments)    dizziness  . Percocet [Oxycodone-Acetaminophen] Other (See Comments)    Percocet does not relieve pts' pain.   . Sulfa Drugs Cross Reactors Nausea Only  . Sulfamethoxazole Nausea And Vomiting  . Benzonatate Nausea And Vomiting  . Escitalopram  Oxalate Other (See Comments)    mild hallucinations  . Metformin Nausea And Vomiting    Reaction to IR and XL  . Oxycodone-Acetaminophen Other (See Comments)    Percocet does not relieve pts' pain  . Pregabalin Other (See Comments)    Unknown reaction    Family History  Problem Relation Age of Onset  . Heart attack Father   . Hypertension Father   . Stroke Mother   . Angina Mother   . Coronary artery disease Other    Prior to Admission medications   Medication Sig Start Date End Date Taking? Authorizing Provider  acetaminophen (TYLENOL) 500 MG tablet Take 500 mg by mouth See admin instructions. Take one tablet (500 mg) by mouth twice daily, may also take one tablet (500 mg) mid-day as needed for pain/headache   Yes [provider]  amLODipine (NORVASC) 5 MG tablet TAKE ONE TABLET BY MOUTH DAILY Patient taking differently: Take 5 mg by mouth daily.  08/23/19  Yes Nahser, Wonda Cheng, MD  aspirin EC 81 MG tablet Take 81 mg by mouth every morning.    Yes [provider]  atorvastatin (LIPITOR) 40 MG tablet Take 40 mg by mouth every morning.  12/31/18  Yes [provider]  cephALEXin (KEFLEX) 500 MG capsule Take 1 capsule (500 mg total) by mouth 3 (three) times daily. 09/18/19  Yes Virgel Manifold, MD  diphenhydramine-acetaminophen (TYLENOL PM) 25-500 MG TABS tablet Take 1 tablet by mouth at bedtime.   Yes [provider]  ergocalciferol (VITAMIN D2) 50000 UNITS capsule Take 50,000 Units by mouth every Friday.  07/30/11  Yes [provider]  fluticasone (FLONASE) 50 MCG/ACT nasal spray Place 2 sprays into both nostrils daily. Patient taking differently: Place 2 sprays into both nostrils daily as needed for allergies or rhinitis.  10/15/16  Yes Tat, Shanon Brow, MD  furosemide (LASIX) 80 MG tablet TAKE 1 TABLET BY MOUTH EVERY DAY Patient taking differently: Take 80 mg by mouth every morning.  08/09/19  Yes Nahser, Wonda Cheng, MD  gabapentin (NEURONTIN) 300 MG  capsule 2 TABLETS BY MOUTH AT BEDTIME Patient taking differently: Take 600 mg by mouth at bedtime.  08/06/16  Yes Nahser, Wonda Cheng, MD  glipiZIDE (GLUCOTROL XL) 5 MG 24 hr tablet Take 5 mg by mouth every morning.   Yes [provider]  hydrALAZINE (APRESOLINE) 25 MG tablet TAKE 1 TABLET BY MOUTH TWICE A DAY Patient taking differently: Take 25 mg by mouth 2 (two) times daily.  07/21/19  Yes Nahser, Wonda Cheng, MD  insulin aspart (NOVOLOG) 100 UNIT/ML injection Inject 3-5 Units into the skin 3 (three) times daily with meals. Based on a sliding scale.   Yes [provider]  insulin glargine (LANTUS) 100 UNIT/ML injection Inject 25 Units into the skin at bedtime.    Yes [provider]  LACTASE PO Take 1 tablet by mouth 3 (three) times daily as needed (if meal includes cheese or other dairy).   Yes [provider]  metoprolol tartrate (LOPRESSOR) 25 MG tablet Take 25 mg by mouth 2 (two)  times daily.   Yes [provider]  Multiple Vitamin (MULTIVITAMIN WITH MINERALS) TABS tablet Take 1 tablet by mouth daily.   Yes [provider]  oxyCODONE (OXY IR/ROXICODONE) 5 MG immediate release tablet Take 5 mg by mouth daily as needed for severe pain.   Yes [provider]  Polyethyl Glycol-Propyl Glycol (SYSTANE) 0.4-0.3 % SOLN Apply 2 drops to eye daily as needed (dry eyes).    Yes [provider]  traMADol (ULTRAM) 50 MG tablet Take 1 tablet (50 mg total) by mouth every 8 (eight) hours as needed. Patient taking differently: Take 50 mg by mouth every 8 (eight) hours as needed (pain).  09/18/19  Yes Virgel Manifold, MD  ALPRAZolam Duanne Moron) 0.25 MG tablet Take 1 tablet (0.25 mg total) by mouth daily as needed for anxiety. Patient not taking: Reported on 09/20/2019 08/12/17   Hoyt Koch, MD  ONE TOUCH ULTRA TEST test strip Use as directed 01/13/19   [provider]    Physical Exam:  Physical Exam Vitals signs reviewed.   Constitutional:      General: She is not in acute distress.    Appearance: She is well-developed. She is not diaphoretic.  Eyes:     Conjunctiva/sclera: Conjunctivae normal.     Pupils: Pupils are equal, round, and reactive to light.  Neck:     Musculoskeletal: Normal range of motion.  Cardiovascular:     Rate and Rhythm: Normal rate and regular rhythm.     Heart sounds: Normal heart sounds. No murmur.  Pulmonary:     Effort: Pulmonary effort is normal. No tachypnea, accessory muscle usage or respiratory distress.     Breath sounds: Normal breath sounds. No wheezing or rales.  Abdominal:     General: Bowel sounds are normal. There is no distension.     Palpations: Abdomen is soft.     Tenderness: There is no abdominal tenderness. There is no guarding or rebound.  Musculoskeletal: Normal range of motion.        General: No tenderness.     Right lower leg: Edema present.     Left lower leg: Edema present.  Lymphadenopathy:     Cervical: No cervical adenopathy.  Skin:    General: Skin is warm and dry.  Neurological:     General: No focal deficit present.     Mental Status: She is alert and oriented to person, place, and time.      Labs on Admission: I have personally reviewed following labs and imaging studies  CBC: Recent Labs  Lab 09/18/19 1708 09/20/19 1347  WBC 11.8* 13.4*  NEUTROABS 9.8* 11.5*  HGB 13.3 12.1  HCT 45.0 39.3  MCV 91.8 89.1  PLT 85* [id number]    Basic Metabolic Panel: Recent Labs  Lab 09/18/19 1708 09/20/19 1347  NA 136 133*  K 5.3* 5.6*  CL 101 92*  CO2 27 24  GLUCOSE 135* 303*  BUN 43* 51*  CREATININE 1.81* 1.80*  CALCIUM 8.7* 8.8*    GFR: Estimated Creatinine Clearance: 24.5 mL/min (A) (by C-G formula based on SCr of 1.8 mg/dL (H)).  Liver Function Tests: Recent Labs  Lab 09/20/19 1347  AST 20  ALT 17  ALKPHOS 74  BILITOT 0.6  PROT 6.3*  ALBUMIN 3.2*   No results for input(s): LIPASE, AMYLASE in the last 168 hours. No results  for input(s): AMMONIA in the last 168 hours.  Coagulation Profile: No results for input(s): INR, PROTIME in the last 168 hours.  Cardiac Enzymes: No results for input(s): CKTOTAL, CKMB, CKMBINDEX, TROPONINI in the last 168 hours.  BNP (last 3 results) No results for input(s): PROBNP in the last 8760 hours.  HbA1C: No results for input(s): HGBA1C in the last 72 hours.  CBG: No results for input(s): GLUCAP in the last 168 hours.  Lipid Profile: No results for input(s): CHOL, HDL, LDLCALC, TRIG, CHOLHDL, LDLDIRECT in the last 72 hours.  Thyroid Function Tests: No results for input(s): TSH, T4TOTAL, FREET4, T3FREE, THYROIDAB in the last 72 hours.  Anemia Panel: No results for input(s): VITAMINB12, FOLATE, FERRITIN, TIBC, IRON, RETICCTPCT in the last 72 hours.  Urine analysis:    Component Value Date/Time   COLORURINE YELLOW 09/18/2019 2051   APPEARANCEUR HAZY (A) 09/18/2019 2051   LABSPEC 1.011 09/18/2019 2051   PHURINE 5.0 09/18/2019 2051   GLUCOSEU NEGATIVE 09/18/2019 2051   HGBUR NEGATIVE 09/18/2019 2051   BILIRUBINUR NEGATIVE 09/18/2019 2051   BILIRUBINUR Neg 08/06/2018 [place] 09/18/2019 2051   PROTEINUR NEGATIVE 09/18/2019 2051   UROBILINOGEN 0.2 08/06/2018 1540   UROBILINOGEN Normal 07/18/2017   NITRITE NEGATIVE 09/18/2019 2051   LEUKOCYTESUR MODERATE (A) 09/18/2019 2051     Radiological Exams on Admission: Ct Head Wo Contrast  Result Date: 09/18/2019 CLINICAL DATA:  Fall, headache EXAM: CT HEAD WITHOUT CONTRAST CT CERVICAL SPINE WITHOUT CONTRAST TECHNIQUE: Multidetector CT imaging of the head and cervical spine was performed following the standard protocol without intravenous contrast. Multiplanar CT image reconstructions of the cervical spine were also generated. COMPARISON:  None. FINDINGS: CT HEAD FINDINGS Brain: No evidence of acute infarction, hemorrhage, hydrocephalus, extra-axial collection or mass lesion/mass effect. Periventricular  white matter hypodensity. Vascular: No hyperdense vessel or unexpected calcification. Skull: Normal. Negative for fracture or focal lesion. Sinuses/Orbits: No acute finding. Other: None. CT CERVICAL SPINE FINDINGS Alignment: Degenerative straightening and reversal of the normal cervical lordosis. Skull base and vertebrae: No acute fracture. There is a nonacute, nondisplaced transverse type II fracture of the odontoid process. No primary bone lesion or focal pathologic process. Soft tissues and spinal canal: No prevertebral fluid or swelling. No visible canal hematoma. Disc levels: Moderate to severe multilevel disc space height loss and osteophytosis. Upper chest: Negative. Other: None. IMPRESSION: 1. No acute intracranial pathology. Small-vessel white matter disease. 2.  No acute fracture or static subluxation of the cervical spine. 3. There is a nonacute, nondisplaced transverse type II fracture of the odontoid process. 4. Moderate to severe multilevel disc space height loss and osteophytosis. Electronically Signed   By: Eddie Candle M.D.   On: 09/18/2019 16:44   Ct Cervical Spine Wo Contrast  Result Date: 09/18/2019 CLINICAL DATA:  Fall, headache EXAM: CT HEAD WITHOUT CONTRAST CT CERVICAL SPINE WITHOUT CONTRAST TECHNIQUE: Multidetector CT imaging of the head and cervical spine was performed following the standard protocol without intravenous contrast. Multiplanar CT image reconstructions of the cervical spine were also generated. COMPARISON:  None. FINDINGS: CT HEAD FINDINGS Brain: No evidence of acute infarction, hemorrhage, hydrocephalus, extra-axial collection or mass lesion/mass effect. Periventricular white matter hypodensity. Vascular: No hyperdense vessel or unexpected calcification. Skull: Normal. Negative for fracture or focal lesion. Sinuses/Orbits: No acute finding. Other: None. CT CERVICAL SPINE FINDINGS Alignment: Degenerative straightening and reversal of the normal cervical lordosis. Skull  base and vertebrae: No acute fracture. There is a nonacute, nondisplaced transverse type II fracture of the odontoid process. No primary bone lesion or focal pathologic process. Soft tissues and spinal canal: No prevertebral fluid or swelling. No  visible canal hematoma. Disc levels: Moderate to severe multilevel disc space height loss and osteophytosis. Upper chest: Negative. Other: None. IMPRESSION: 1. No acute intracranial pathology. Small-vessel white matter disease. 2.  No acute fracture or static subluxation of the cervical spine. 3. There is a nonacute, nondisplaced transverse type II fracture of the odontoid process. 4. Moderate to severe multilevel disc space height loss and osteophytosis. Electronically Signed   By: Eddie Candle M.D.   On: 09/18/2019 16:44   Dg Chest Portable 1 View  Result Date: 09/20/2019 CLINICAL DATA:  85 year old female with shortness of breath. EXAM: PORTABLE CHEST 1 VIEW COMPARISON:  Chest radiograph dated 09/18/2019. FINDINGS: Mild cardiomegaly with mild vascular congestion. No focal consolidation or pneumothorax. No significant pleural effusion. Atherosclerotic calcification of the aorta. No acute osseous pathology. IMPRESSION: Mild cardiomegaly with mild vascular congestion. No focal consolidation. Electronically Signed   By: Anner Crete M.D.   On: 09/20/2019 14:04   Dg Chest Portable 1 View  Result Date: 09/18/2019 CLINICAL DATA:  Hypoxemia. Fell today. EXAM: PORTABLE CHEST 1 VIEW COMPARISON:  10/09/2016 FINDINGS: Stable enlarged cardiac silhouette. Clear lungs with normal vascularity. Thoracic spine degenerative changes. Stable old, healed left rib fractures. No acute fracture or pneumothorax seen. IMPRESSION: No acute abnormality. Stable cardiomegaly. Electronically Signed   By: Claudie Revering M.D.   On: 09/18/2019 16:42    EKG: None available on admission  Assessment/Plan Principal Problem:   Acute on chronic diastolic CHF (congestive heart failure) (H[facility])  Active Problems:   Hypertension, essential   Diabetic polyneuropathy associated with type 2 diabetes mellitus ([place])   Acute kidney injury superimposed on CKD ([place])   Acute metabolic encephalopathy  Acute on chronic diastolic heart failure Patient with associated hypoxia. No associated chest pain. Troponin of 54. EKG pending -Continue Lasix 80 mg daily -Continue oxygen -Daily weights and strict in/out -Transthoracic Echocardiogram  Acute metabolic encephalopathy Secondary to hypoxia. Mental status improved with oxygen supplementation  Acute respiratory failure with hypoxia Documented hypoxia of 71% on room air. Given 3L via nasal canula -Continue oxygen supplementation with goal >90% -Wean to room air as able  AKI on CKD stage III Likely secondary to heart failure exacerbation. Baseline creatinine of about 1.3. Creatinine of 1.8 on admission -Lasix as above  Klebsiella UTI Sensitivities pending -Continue Ceftriaxone  Essential hypertension -Continue amlodipine and metoprolol  Diabetes mellitus, type 2 Last hemoglobin A1C of 7.3 from 03/2019 -Continue Lantus 20 units (20% reduction) for now. With hemoglobin A1C and age, would possibly be safe to manage without insulin, or at least without short acting insulin on discharge -SSI with meals -Recommend to discontinue glipizide on discharge secondary to age  Weakness Possibly secondary to hypoxia vs UTI -PT/OT eval  Morbid obesity Body mass index is 34.33 kg/m.  Neuropathy -Continue gabapentin but will decrease dose secondary to kidney function; gabapentin 300 mg QHS  Recent fall Patient with non-acute/nondisplaced odontoid process fracture. Possible she could have a concussion. Associated headache. Discussed with neurosurgery who recommended Aspen collar and outpatient follow-up. Can follow-up with Dr. Glenford Peers -Repeat head CT -Tylenol   DVT prophylaxis: Heparin Code Status: DNR Family Communication: Son at  bedside Disposition Plan: Discharge pending weaning off oxygen, PT eval Consults called: Neurosurgery (curbside) Admission status: Inpatient   Cordelia Poche, MD [facility] 09/20/2019, 4:16 PM

## 2019-09-20 NOTE — ED Notes (Signed)
ED TO INPATIENT HANDOFF REPORT  ED Nurse Name and Phone #: Lunette Stands Francis Name/Age/Gender [name] 85 y.o. female Room/Bed: 039C/039C  Code Status   Code Status: DNR  Home/SNF/Other Home Patient oriented to: self, place, time and situation Is this baseline? Yes   Triage Complete: Triage complete  Chief Complaint ams  Triage Note Pt arrives via EMS from home with increased confusion since Sat. Recently diagnosed with UTI. Pt normally able to walk with walker. REcently unable to get out of bed, take medications. Alert to year, person, place. Vital 142/64, HR 74, NSR cbg 324, rr 20, has hx of COPD, 4L 94%. 20 g LAC. Does not wear O2 at home RA sat 71%.    Allergies Allergies  Allergen Reactions  . Atenolol Other (See Comments)    weakness  . Codeine Nausea And Vomiting    Hydrocodone is ok  . Lactose Intolerance (Gi) Diarrhea  . Lodine [Etodolac] Other (See Comments)    dizziness  . Percocet [Oxycodone-Acetaminophen] Other (See Comments)    Percocet does not relieve pts' pain.   . Sulfa Drugs Cross Reactors Nausea Only  . Sulfamethoxazole Nausea And Vomiting  . Benzonatate Nausea And Vomiting  . Escitalopram Oxalate Other (See Comments)    mild hallucinations  . Metformin Nausea And Vomiting    Reaction to IR and XL  . Oxycodone-Acetaminophen Other (See Comments)    Percocet does not relieve pts' pain  . Pregabalin Other (See Comments)    Unknown reaction    Level of Care/Admitting Diagnosis ED Disposition    ED Disposition Condition Comment   Admit  Hospital Area: [facility] [[id number]]  Level of Care: Telemetry Medical [104]  Covid Evaluation: Asymptomatic Screening Protocol (No Symptoms)  Diagnosis: Acute diastolic heart failure (HCC) [428.31.ICD-9-CM]  Admitting Physician: Mariel Aloe [id number]  Attending Physician: Mariel Aloe [phone]  Estimated length of stay: past midnight tomorrow  Certification:: I certify this patient  will need inpatient services for at least 2 midnights  PT Class (Do Not Modify): Inpatient [101]  PT Acc Code (Do Not Modify): Private [1]       B Medical/Surgery History Past Medical History:  Diagnosis Date  . Anasarca 04/2009   found to be secondary to pericardial effusion with tamponade/pericardial window done  . Chronic diastolic CHF (congestive heart failure) ([place])   . CKD (chronic kidney disease), stage III   . Coronary artery disease    a. mild-mod CAD 2004.  . Diabetes mellitus    insulin dependent  . Herpes labialis    Healing herpes labialis  . Hypercholesterolemia   . Hypertension   . Lumbar radiculopathy   . Lumbar scoliosis   . Morbid obesity ([place])   . Pericardial effusion 04/2009   a. 2010 with tamponade s/p window.  Marland Kitchen PONV (postoperative nausea and vomiting)    has not had any problems since 2001  . Spondylolisthesis   . Spondylosis    Past Surgical History:  Procedure Laterality Date  . BACK SURGERY     microdiskectomy L3-4 on L/partial facetectomy 3-4 on L/removal of synovial cyst on left L3-4  . BREAST BIOPSY     left  . CARDIAC CATHETERIZATION  04/25/2003   L heart cath w/coronary angiography/R femoral artery/ EF 65%/no mitral regurgitation/ angiography L main coronary artery smooth & normal  . EYE SURGERY     ophthalmoscopy/peritomy adjacent to the limbus from the 8 to 2:30 position superiorly  . lumbar laminectomy    .  SUBXYPHOID PERICARDIAL WINDOW  04/2009   for pericardial effusion w/pericardial tamponade/sac drained w/20-French Baard drain/transesophageal echocardiogram confimed complete evacution of pericardial fluid  . TOTAL HIP ARTHROPLASTY  03/06/2012   Procedure: TOTAL HIP ARTHROPLASTY;  Surgeon: Kerin Salen, MD;  Location: [facility];  Service: Orthopedics;  Laterality: Left;  DEPUY/PINNACLE, SUMMIT STEM, CUP POLY & CERAMIC  . VAGINAL HYSTERECTOMY       A IV Location/Drains/Wounds Patient Lines/Drains/Airways Status   Active  Line/Drains/Airways    Name:   Placement date:   Placement time:   Site:   Days:   Peripheral IV 09/20/19 Left Antecubital   09/20/19    -    Antecubital   less than 1          Intake/Output Last 24 hours No intake or output data in the 24 hours ending 09/20/19 2221  Labs/Imaging Results for orders placed or performed during the hospital encounter of 09/20/19 (from the past 48 hour(s))  Comprehensive metabolic panel     Status: Abnormal   Collection Time: 09/20/19  1:47 PM  Result Value Ref Range   Sodium 133 (L) 135 - 145 mmol/L   Potassium 5.6 (H) 3.5 - 5.1 mmol/L   Chloride 92 (L) 98 - 111 mmol/L   CO2 24 22 - 32 mmol/L   Glucose, Bld 303 (H) 70 - 99 mg/dL   BUN 51 (H) 8 - 23 mg/dL   Creatinine, Ser 1.80 (H) 0.44 - 1.00 mg/dL   Calcium 8.8 (L) 8.9 - 10.3 mg/dL   Total Protein 6.3 (L) 6.5 - 8.1 g/dL   Albumin 3.2 (L) 3.5 - 5.0 g/dL   AST 20 15 - 41 U/L   ALT 17 0 - 44 U/L   Alkaline Phosphatase 74 38 - 126 U/L   Total Bilirubin 0.6 0.3 - 1.2 mg/dL   GFR calc non Af Amer 25 (L) >60 mL/min   GFR calc Af Amer 29 (L) >60 mL/min   Anion gap 17 (H) 5 - 15    Comment: Performed at [facility], [address] [place], [place] [address]  CBC with Differential     Status: Abnormal   Collection Time: 09/20/19  1:47 PM  Result Value Ref Range   WBC 13.4 (H) 4.0 - 10.5 K/uL   RBC 4.41 3.87 - 5.11 MIL/uL   Hemoglobin 12.1 12.0 - 15.0 g/dL   HCT 39.3 36.0 - 46.0 %   MCV 89.1 80.0 - 100.0 fL   MCH 27.4 26.0 - 34.0 pg   MCHC 30.8 30.0 - 36.0 g/dL   RDW 13.9 11.5 - 15.5 %   Platelets 235 150 - 400 K/uL   nRBC 0.1 0.0 - 0.2 %   Neutrophils Relative % 86 %   Neutro Abs 11.5 (H) 1.7 - 7.7 K/uL   Lymphocytes Relative 8 %   Lymphs Abs 1.0 0.7 - 4.0 K/uL   Monocytes Relative 5 %   Monocytes Absolute 0.7 0.1 - 1.0 K/uL   Eosinophils Relative 0 %   Eosinophils Absolute 0.0 0.0 - 0.5 K/uL   Basophils Relative 0 %   Basophils Absolute 0.0 0.0 - 0.1 K/uL   Immature Granulocytes  1 %   Abs Immature Granulocytes 0.16 (H) 0.00 - 0.07 K/uL    Comment: Performed at [facility] [address] [place], [place] [address]  Brain natriuretic peptide     Status: Abnormal   Collection Time: 09/20/19  1:47 PM  Result Value Ref  Range   B Natriuretic Peptide 1,277.5 (H) 0.0 - 100.0 pg/mL    Comment: Performed at [facility] [address] [place], [place] [address]  SARS CORONAVIRUS 2 (TAT 6-24 HRS) Nasopharyngeal Nasopharyngeal Swab     Status: None   Collection Time: 09/20/19  2:03 PM   Specimen: Nasopharyngeal Swab  Result Value Ref Range   SARS Coronavirus 2 NEGATIVE NEGATIVE    Comment: (NOTE) SARS-CoV-2 target nucleic acids are NOT DETECTED. The SARS-CoV-2 RNA is generally detectable in upper and lower respiratory specimens during the acute phase of infection. Negative results do not preclude SARS-CoV-2 infection, do not rule out co-infections with other pathogens, and should not be used as the sole basis for treatment or other patient management decisions. Negative results must be combined with clinical observations, patient history, and epidemiological information. The expected result is Negative. Fact Sheet for Patients: SugarRoll.be Fact Sheet for Healthcare Providers: https://www.woods-mathews.com/ This test is not yet approved or cleared by the [place] FDA and  has been authorized for detection and/or diagnosis of SARS-CoV-2 by FDA under an Emergency Use Authorization (EUA). This EUA will remain  in effect (meaning this test can be used) for the duration of the COVID-19 declaration under Section 56 4(b)(1) of the Act, 21 U.S.C. section 360bbb-3(b)(1), unless the authorization is terminated or revoked sooner. Performed at [facility], [place] [address], [place], [place] [address]   Troponin I (High Sensitivity)     Status: Abnormal   Collection Time: 09/20/19  3:55 PM  Result Value Ref  Range   Troponin I (High Sensitivity) 54 (H) <18 ng/L    Comment: (NOTE) Elevated high sensitivity troponin I (hsTnI) values and significant  changes across serial measurements may suggest ACS but many other  chronic and acute conditions are known to elevate hsTnI results.  Refer to the "Links" section for chest pain algorithms and additional  guidance. Performed at [facility], [place] [address], [place], [place] [address]    Ct Head Wo Contrast  Result Date: 09/20/2019 CLINICAL DATA:  Altered level of consciousness EXAM: CT HEAD WITHOUT CONTRAST TECHNIQUE: Contiguous axial images were obtained from the base of the skull through the vertex without intravenous contrast. COMPARISON:  09/18/2019 FINDINGS: Brain: Mild chronic microvascular disease throughout the deep white matter. No acute intracranial abnormality. Specifically, no hemorrhage, hydrocephalus, mass lesion, acute infarction, or significant intracranial injury. Vascular: No hyperdense vessel or unexpected calcification. Skull: No acute calvarial abnormality. Sinuses/Orbits: Visualized paranasal sinuses and mastoids clear. Orbital soft tissues unremarkable. Other: None IMPRESSION: Chronic small vessel disease.  No acute intracranial abnormality. Electronically Signed   By: Rolm Baptise M.D.   On: 09/20/2019 20:18   Dg Chest Portable 1 View  Result Date: 09/20/2019 CLINICAL DATA:  85 year old female with shortness of breath. EXAM: PORTABLE CHEST 1 VIEW COMPARISON:  Chest radiograph dated 09/18/2019. FINDINGS: Mild cardiomegaly with mild vascular congestion. No focal consolidation or pneumothorax. No significant pleural effusion. Atherosclerotic calcification of the aorta. No acute osseous pathology. IMPRESSION: Mild cardiomegaly with mild vascular congestion. No focal consolidation. Electronically Signed   By: Anner Crete M.D.   On: 09/20/2019 14:04    Pending Labs Unresulted Labs (From admission, onward)    Start      Ordered   09/21/19 [id number]  Basic metabolic panel  Daily,   R     09/20/19 1733   09/20/19 1417  Blood culture (routine x 2)  BLOOD CULTURE X 2,   STAT  09/20/19 1416   09/20/19 1305  Urinalysis, Routine w reflex microscopic  ONCE - STAT,   STAT     09/20/19 1304          Vitals/Pain Today's Vitals   09/20/19 1845 09/20/19 1900 09/20/19 2108 09/20/19 2112  BP: (!) 144/64 (!) 148/59 (!) 154/62   Pulse:   62 63  Resp: 20 17  18   Temp:      TempSrc:      SpO2:   96% 97%  Weight:      Height:      PainSc:        Isolation Precautions No active isolations  Medications Medications  cefTRIAXone (ROCEPHIN) 1 g in sodium chloride 0.9 % 100 mL IVPB (has no administration in time range)  furosemide (LASIX) injection 80 mg (has no administration in time range)  aspirin EC tablet 81 mg (has no administration in time range)  traMADol (ULTRAM) tablet 50 mg (has no administration in time range)  amLODipine (NORVASC) tablet 5 mg (has no administration in time range)  atorvastatin (LIPITOR) tablet 40 mg (has no administration in time range)  hydrALAZINE (APRESOLINE) tablet 25 mg (has no administration in time range)  metoprolol tartrate (LOPRESSOR) tablet 25 mg (has no administration in time range)  insulin glargine (LANTUS) injection 20 Units (has no administration in time range)  gabapentin (NEURONTIN) capsule 300 mg (has no administration in time range)  sodium chloride flush (NS) 0.9 % injection 3 mL (has no administration in time range)  sodium chloride flush (NS) 0.9 % injection 3 mL (has no administration in time range)  0.9 %  sodium chloride infusion (has no administration in time range)  acetaminophen (TYLENOL) tablet 650 mg (has no administration in time range)  ondansetron (ZOFRAN) injection 4 mg (4 mg Intravenous Given 09/20/19 1918)  heparin injection 5,000 Units (5,000 Units Subcutaneous Given 09/20/19 1852)  insulin aspart (novoLOG) injection 0-9 Units (has no  administration in time range)  cefTRIAXone (ROCEPHIN) 1 g in sodium chloride 0.9 % 100 mL IVPB (0 g Intravenous Stopped 09/20/19 1543)  furosemide (LASIX) injection 60 mg (60 mg Intravenous Given 09/20/19 1544)    Mobility non-ambulatory High fall risk   Focused Assessments Cardiac Assessment Handoff:  Cardiac Rhythm: Normal sinus rhythm Lab Results  Component Value Date   CKTOTAL 210 10/08/2016   CKMB 0.7 04/21/2009   TROPONINI 0.01        NO INDICATION OF MYOCARDIAL INJURY. 04/21/2009   No results found for: DDIMER Does the Patient currently have chest pain? No     R Recommendations: See Admitting Provider Note  Report given to:   Additional Notes: N/A

## 2019-09-21 ENCOUNTER — Inpatient Hospital Stay (HOSPITAL_COMMUNITY): Payer: Medicare Other

## 2019-09-21 ENCOUNTER — Other Ambulatory Visit: Payer: Self-pay

## 2019-09-21 DIAGNOSIS — I129 Hypertensive chronic kidney disease with stage 1 through stage 4 chronic kidney disease, or unspecified chronic kidney disease: Secondary | ICD-10-CM | POA: Diagnosis present

## 2019-09-21 DIAGNOSIS — E669 Obesity, unspecified: Secondary | ICD-10-CM

## 2019-09-21 DIAGNOSIS — N390 Urinary tract infection, site not specified: Secondary | ICD-10-CM | POA: Diagnosis present

## 2019-09-21 DIAGNOSIS — L8990 Pressure ulcer of unspecified site, unspecified stage: Secondary | ICD-10-CM | POA: Diagnosis present

## 2019-09-21 DIAGNOSIS — N1832 Chronic kidney disease, stage 3b: Secondary | ICD-10-CM

## 2019-09-21 DIAGNOSIS — L89312 Pressure ulcer of right buttock, stage 2: Secondary | ICD-10-CM

## 2019-09-21 DIAGNOSIS — E6609 Other obesity due to excess calories: Secondary | ICD-10-CM | POA: Diagnosis present

## 2019-09-21 DIAGNOSIS — B9689 Other specified bacterial agents as the cause of diseases classified elsewhere: Secondary | ICD-10-CM

## 2019-09-21 DIAGNOSIS — I5033 Acute on chronic diastolic (congestive) heart failure: Secondary | ICD-10-CM

## 2019-09-21 DIAGNOSIS — E782 Mixed hyperlipidemia: Secondary | ICD-10-CM

## 2019-09-21 DIAGNOSIS — N183 Chronic kidney disease, stage 3 unspecified: Secondary | ICD-10-CM | POA: Diagnosis present

## 2019-09-21 DIAGNOSIS — E0865 Diabetes mellitus due to underlying condition with hyperglycemia: Secondary | ICD-10-CM

## 2019-09-21 LAB — BASIC METABOLIC PANEL
Anion gap: 10 (ref 5–15)
BUN: 51 mg/dL — ABNORMAL HIGH (ref 8–23)
CO2: 29 mmol/L (ref 22–32)
Calcium: 8.7 mg/dL — ABNORMAL LOW (ref 8.9–10.3)
Chloride: 96 mmol/L — ABNORMAL LOW (ref 98–111)
Creatinine, Ser: 1.78 mg/dL — ABNORMAL HIGH (ref 0.44–1.00)
GFR calc Af Amer: 29 mL/min — ABNORMAL LOW (ref >60)
GFR calc non Af Amer: 25 mL/min — ABNORMAL LOW (ref >60)
Glucose, Bld: 126 mg/dL — ABNORMAL HIGH (ref 70–99)
Potassium: 4.8 mmol/L (ref 3.5–5.1)
Sodium: 135 mmol/L (ref 135–145)

## 2019-09-21 LAB — GLUCOSE, CAPILLARY
Glucose-Capillary: 88 mg/dL (ref 70–99)
Glucose-Capillary: 158 mg/dL — ABNORMAL HIGH (ref 70–99)
Glucose-Capillary: 118 mg/dL — ABNORMAL HIGH (ref 70–99)
Glucose-Capillary: 122 mg/dL — ABNORMAL HIGH (ref 70–99)

## 2019-09-21 LAB — URINE CULTURE: Culture: >=100000 — AB

## 2019-09-21 LAB — ECHOCARDIOGRAM COMPLETE
Height: 64 in
Weight: 3280.44 oz

## 2019-09-21 NOTE — Progress Notes (Addendum)
Progress Note    [name]  [id number] DOB: [birth year]  DOA: 09/20/2019 PCP: Laurie Koch, MD    Brief Narrative:   Chief complaint: Follow-up confusion.  Medical records reviewed and are as summarized below:  [name] is an 85 y.o. female with a PMH of chronic diastolic CHF, stage III CKD, CAD, type II DM with neurological complications, morbid obesity and HTN who was admitted with a 2 day h/o worsening confustion in the setting of recently being treated for a UTI with Keflex. Work up in the ED showed a BNP of 1277 and a CXR significant for cardiomegaly with vascular congestion.  She was placed on IV Lasix and IV Rocephin.  Assessment/Plan:   Principal Problem:   Acute on chronic diastolic CHF (congestive heart failure) (HCC) associated with hypoxia requiring supplemental oxygen The patient was noted to require 3 L of oxygen via nasal cannula on admission and had a documented oxygen saturation of 71% on room air, which improved to the low 90's on oxygen. Placed on Lasix 80 mg IV daily. Despite this, weight is up today and there is no significant data on diuresis with I/O.  Last 2D echo done 10/08/2016 and showed an EF of 55-60% with pseudonormal filling pressures consistent with grade 2 diastolic dysfunction.  Repeat echo being done now.  Patient continues to report shortness of breath and is speaking in incomplete sentences secondary to dyspnea.  Remains volume overloaded and continues to need IV diuresis.  Chest x-ray personally reviewed and does show cardiomegaly with central vascular prominence consistent with volume overload.  Active Problems:   Hypertension, essential Continue Lasix, Norvasc, hydralazine, and metoprolol.  Blood pressure currently controlled.    Diabetic polyneuropathy associated with poorly controlled type 2 diabetes mellitus ([place]) Hemoglobin A1c 7.3% on 03/22/2019.  Currently being managed with 20 units of Lantus and insulin sensitive SSI 3  times daily.  CBGs 88-164.  Continue Neurontin for polyneuropathy.    Acute kidney injury in the setting of known CKD stage III B  (HCC) Baseline creatinine appears to be around 1.4.  Current creatinine elevated over usual baseline values. GFR consistent with stage III B CKD at baseline (30).  Current creatinine 1.78 with a GFR of 25.  Will need to monitor closely while aggressively diuresing.    Acute metabolic encephalopathy Patient's mental status appears to be at baseline.    Pressure injury of skin buttocks, Stage II -  Partial thickness loss of dermis presenting as a shallow open ulcer with a red, pink wound bed without slough, present on admission Wound care per nursing.    Klebsiella UTI Urine cultures from 09/18/2019 grew Klebsiella, Rocephin sensitive.  Continue Rocephin.    Syncope/fall resulting in a nondisplaced transverse type II fracture of the odontoid process Patient tells me she fell and is currently wearing a neck immobilizer but I do not see any discussion of this on her admission H&P or on the ED notes.  Fall occurred on 09/18/2019 as there are imaging results showing a CT of her cervical spine with a nonacute nondisplaced transverse type II fracture of the odontoid process, and it appears she was evaluated in the ED at that time.  PT/OT evaluations requested.    Hyperlipidemia Continue atorvastatin.    Obesity BMI 30-39.9 Body mass index is 35.19 kg/m.   Family Communication/Anticipated D/C date and plan/Code Status   DVT prophylaxis: Heparin ordered. Code Status: DNR.  Family Communication: Left message for son.  Son called me back around 4 pm.  Very concerned that mother won't be strong enough to return home but also concerned about placing her in a SNF. Disposition Plan: Home vs. SNF when respiratory status improved, no longer requiring oxygen and fully diuresed.   Medical Consultants:    None.   Anti-Infectives:    Rocephin  09/20/19--->  Subjective:   [name] tells me that she continues to be short of breath and is having difficulty completing her sentences.  She is undergoing a 2D echocardiogram as I evaluate her.  Denies chest pain.  Tells me she fell recently and has been weak.  Objective:    Vitals:   09/20/19 2340 09/21/19 0524 09/21/19 0526 09/21/19 0737  BP:  (!) 138/56  133/61  Pulse: 64 (!) 58    Resp:  (!) 21    Temp:   97.8 F (36.6 C)   TempSrc:   Oral   SpO2:  99%    Weight:  93 kg    Height:  5\' 4"  (1.626 m)      Intake/Output Summary (Last 24 hours) at 09/21/2019 0838 Last data filed at 09/21/2019 0528 Gross per 24 hour  Intake 240 ml  Output --  Net 240 ml   Filed Weights   09/20/19 1238 09/21/19 0524  Weight: 90.7 kg 93 kg    Exam: General: No acute distress. Cardiovascular: Heart sounds show a regular rate, and rhythm. No gallops or rubs. No murmurs. No JVD. Lungs: Clear to auscultation bilaterally with decreased breath sounds.  Unable to appreciate rales, but cannot turn her on her back because she is getting a 2D echo done at this time.  Unable to speak in complete sentences with resting dyspnea apparent. Abdomen: Soft, nontender, nondistended with normal active bowel sounds. No masses. No hepatosplenomegaly. Neurological: Alert and oriented 3. Moves all extremities 4 with equal strength. Cranial nerves II through XII grossly intact. Skin: Warm and dry.  Stage II pressure injury to right buttocks per nursing.  Described below. Extremities: No clubbing or cyanosis. 2+ edema. Pedal pulses 2+. Psychiatric: Mood and affect are normal. Insight and judgment are normal.  Pressure Injury 09/20/19 Buttocks Right Stage II -  Partial thickness loss of dermis presenting as a shallow open ulcer with a red, pink wound bed without slough. (Active)  09/20/19 2300  Location: Buttocks  Location Orientation: Right  Staging: Stage II -  Partial thickness loss of dermis presenting as a  shallow open ulcer with a red, pink wound bed without slough.  Wound Description (Comments):   Present on Admission: Yes    Data Reviewed:   I have personally reviewed following labs and imaging studies:  Labs: Labs show the following:   Basic Metabolic Panel: Recent Labs  Lab 09/18/19 1708 09/20/19 1347 09/21/19 0316  NA 136 133* 135  K 5.3* 5.6* 4.8  CL 101 92* 96*  CO2 27 24 29   GLUCOSE 135* 303* 126*  BUN 43* 51* 51*  CREATININE 1.81* 1.80* 1.78*  CALCIUM 8.7* 8.8* 8.7*   GFR Estimated Creatinine Clearance: 25.1 mL/min (A) (by C-G formula based on SCr of 1.78 mg/dL (H)). Liver Function Tests: Recent Labs  Lab 09/20/19 1347  AST 20  ALT 17  ALKPHOS 74  BILITOT 0.6  PROT 6.3*  ALBUMIN 3.2*   CBC: Recent Labs  Lab 09/18/19 1708 09/20/19 1347  WBC 11.8* 13.4*  NEUTROABS 9.8* 11.5*  HGB 13.3 12.1  HCT 45.0 39.3  MCV 91.8 89.1  PLT 85* 235   CBG: Recent Labs  Lab 09/20/19 2352 09/21/19 0800  GLUCAP 164* 88   Microbiology Recent Results (from the past 240 hour(s))  SARS CORONAVIRUS 2 (TAT 6-24 HRS) Nasopharyngeal Nasopharyngeal Swab     Status: None   Collection Time: 09/18/19  3:19 PM   Specimen: Nasopharyngeal Swab  Result Value Ref Range Status   SARS Coronavirus 2 NEGATIVE NEGATIVE Final    Comment: (NOTE) SARS-CoV-2 target nucleic acids are NOT DETECTED. The SARS-CoV-2 RNA is generally detectable in upper and lower respiratory specimens during the acute phase of infection. Negative results do not preclude SARS-CoV-2 infection, do not rule out co-infections with other pathogens, and should not be used as the sole basis for treatment or other patient management decisions. Negative results must be combined with clinical observations, patient history, and epidemiological information. The expected result is Negative. Fact Sheet for Patients: SugarRoll.be Fact Sheet for Healthcare  Providers: https://www.woods-mathews.com/ This test is not yet approved or cleared by the [place] FDA and  has been authorized for detection and/or diagnosis of SARS-CoV-2 by FDA under an Emergency Use Authorization (EUA). This EUA will remain  in effect (meaning this test can be used) for the duration of the COVID-19 declaration under Section 56 4(b)(1) of the Act, 21 U.S.C. section 360bbb-3(b)(1), unless the authorization is terminated or revoked sooner. Performed at [facility], [place] [address], [place], [place] [address]   Urine culture     Status: Abnormal (Preliminary result)   Collection Time: 09/18/19  8:51 PM   Specimen: Urine, Random  Result Value Ref Range Status   Specimen Description   Final    URINE, RANDOM Performed at [place] [address], [place], [place] [address]    Special Requests   Final    NONE Performed at [facility], [place] [address], [place], [place] [address]    Culture (A)  Final    >=100,000 COLONIES/mL KLEBSIELLA PNEUMONIAE SUSCEPTIBILITIES TO FOLLOW Performed at [facility], [place] [address] [place], [place] [address]    Report Status PENDING  Incomplete  SARS CORONAVIRUS 2 (TAT 6-24 HRS) Nasopharyngeal Nasopharyngeal Swab     Status: None   Collection Time: 09/20/19  2:03 PM   Specimen: Nasopharyngeal Swab  Result Value Ref Range Status   SARS Coronavirus 2 NEGATIVE NEGATIVE Final    Comment: (NOTE) SARS-CoV-2 target nucleic acids are NOT DETECTED. The SARS-CoV-2 RNA is generally detectable in upper and lower respiratory specimens during the acute phase of infection. Negative results do not preclude SARS-CoV-2 infection, do not rule out co-infections with other pathogens, and should not be used as the sole basis for treatment or other patient management decisions. Negative results must be combined with clinical observations, patient history, and epidemiological  information. The expected result is Negative. Fact Sheet for Patients: SugarRoll.be Fact Sheet for Healthcare Providers: https://www.woods-mathews.com/ This test is not yet approved or cleared by the [place] FDA and  has been authorized for detection and/or diagnosis of SARS-CoV-2 by FDA under an Emergency Use Authorization (EUA). This EUA will remain  in effect (meaning this test can be used) for the duration of the COVID-19 declaration under Section 56 4(b)(1) of the Act, 21 U.S.C. section 360bbb-3(b)(1), unless the authorization is terminated or revoked sooner. Performed at [facility], [place] [address] [place], [place] [address]     Procedures and diagnostic studies:  Ct Head Wo Contrast  Result Date: 09/20/2019  CLINICAL DATA:  Altered level of consciousness EXAM: CT HEAD WITHOUT CONTRAST TECHNIQUE: Contiguous axial images were obtained from the base of the skull through the vertex without intravenous contrast. COMPARISON:  09/18/2019 FINDINGS: Brain: Mild chronic microvascular disease throughout the deep white matter. No acute intracranial abnormality. Specifically, no hemorrhage, hydrocephalus, mass lesion, acute infarction, or significant intracranial injury. Vascular: No hyperdense vessel or unexpected calcification. Skull: No acute calvarial abnormality. Sinuses/Orbits: Visualized paranasal sinuses and mastoids clear. Orbital soft tissues unremarkable. Other: None IMPRESSION: Chronic small vessel disease.  No acute intracranial abnormality. Electronically Signed   By: Rolm Baptise M.D.   On: 09/20/2019 20:18   Dg Chest Portable 1 View  Result Date: 09/20/2019 CLINICAL DATA:  85 year old female with shortness of breath. EXAM: PORTABLE CHEST 1 VIEW COMPARISON:  Chest radiograph dated 09/18/2019. FINDINGS: Mild cardiomegaly with mild vascular congestion. No focal consolidation or pneumothorax. No significant pleural effusion.  Atherosclerotic calcification of the aorta. No acute osseous pathology. IMPRESSION: Mild cardiomegaly with mild vascular congestion. No focal consolidation. Electronically Signed   By: Anner Crete M.D.   On: 09/20/2019 14:04    Medications:    amLODipine  5 mg Oral Daily   aspirin EC  81 mg Oral q morning - 10a   atorvastatin  40 mg Oral q morning - 10a   furosemide  80 mg Intravenous Daily   gabapentin  300 mg Oral QHS   heparin  5,000 Units Subcutaneous Q8H   hydrALAZINE  25 mg Oral BID   insulin aspart  0-9 Units Subcutaneous TID WC   insulin glargine  20 Units Subcutaneous QHS   metoprolol tartrate  25 mg Oral BID   sodium chloride flush  3 mL Intravenous Q12H   Continuous Infusions:  sodium chloride     cefTRIAXone (ROCEPHIN)  IV       LOS: 1 day   Jacquelynn Cree  [facility] Pager [phone].   [facility] How to contact the [facility] Attending or Consulting provider [place] or covering provider during after hours [place], for this patient?  1. Check the care team in [facility] and look for a) attending/consulting [facility] provider listed and b) the [facility] team listed 2. Log into [email or web address] and use [place]'s universal password to access. If you do not have the password, please contact the hospital operator. 3. Locate the [facility] provider you are looking for under [facility] and page to a number that you can be directly reached. 4. If you still have difficulty reaching the provider, please page the [facility] (Director on Call) for the Hospitalists listed on amion for assistance.  09/21/2019, 8:38 AM

## 2019-09-21 NOTE — Progress Notes (Signed)
  Echocardiogram 2D Echocardiogram has been performed.  Tamica Covell A Fiorela Pelzer 09/21/2019, 9:55 AM

## 2019-09-21 NOTE — Evaluation (Signed)
Physical Therapy Evaluation Patient Details Name: [name] MRN: [id number] DOB: 24-Feb-1931 Today's Date: 09/21/2019   History of Present Illness  85 y.o. female with medical history significant of CHF, CKD stage III, CAD, diabetes, morbid obesity admitted with fall, confusion, UTI. Imaging showed old type II odontoid fracture, neuro recommended cervical collar.  Clinical Impression  Pt admitted with above diagnosis. +2 mod assist for bed mobility, min assist to transfer bed to recliner. SaO2 80% on room air at rest, 93% on 3L O2. ST-SNF recommended.  Pt currently with functional limitations due to the deficits listed below (see PT Problem List). Pt will benefit from skilled PT to increase their independence and safety with mobility to allow discharge to the venue listed below.       Follow Up Recommendations SNF;Supervision for mobility/OOB    Equipment Recommendations  None recommended by PT    Recommendations for Other Services       Precautions / Restrictions Precautions Precautions: Cervical;Fall Precaution Booklet Issued: No Required Braces or Orthoses: Cervical Brace Cervical Brace: Hard collar;Other (comment)(ok to be off while in bed, to walk to bathroom, and to showe) Restrictions Weight Bearing Restrictions: No      Mobility  Bed Mobility Overal bed mobility: Needs Assistance Bed Mobility: Rolling;Sidelying to Sit Rolling: Mod assist;+2 for physical assistance Sidelying to sit: Mod assist;+2 for physical assistance       General bed mobility comments: assistance to initiate roll and to raise trunk  Transfers Overall transfer level: Needs assistance Equipment used: Rolling walker (2 wheeled) Transfers: Sit to/from Stand Sit to Stand: Min assist;+2 safety/equipment         General transfer comment: assist to rise  Ambulation/Gait Ambulation/Gait assistance: Min guard Gait Distance (Feet): 3 Feet Assistive device: Rolling walker (2 wheeled) Gait  Pattern/deviations: Step-through pattern;Decreased stride length     General Gait Details: pt took several pivotal steps from bed to recliner, distance limited by fatigue  Stairs            Wheelchair Mobility    Modified Rankin (Stroke Patients Only)       Balance Overall balance assessment: Needs assistance   Sitting balance-Leahy Scale: Fair     Standing balance support: Bilateral upper extremity supported Standing balance-Leahy Scale: Poor                               Pertinent Vitals/Pain Pain Assessment: Faces Faces Pain Scale: Hurts even more Pain Location: posterior neck Pain Descriptors / Indicators: Guarding;Grimacing Pain Intervention(s): Limited activity within patient's tolerance;Monitored during session;Repositioned    Home Living Family/patient expects to be discharged to:: Private residence Living Arrangements: Children Available Help at Discharge: Family Type of Home: House Home Access: Stairs to enter   Technical brewer of Steps: 3 Home Layout: Two level;Able to live on main level with bedroom/bathroom Home Equipment: Clinical cytogeneticist - 2 wheels      Prior Function Level of Independence: Independent with assistive device(s)         Comments: walks with RW, lives with son who is home 24*     Hand Dominance        Extremity/Trunk Assessment   Upper Extremity Assessment Upper Extremity Assessment: Defer to OT evaluation    Lower Extremity Assessment Lower Extremity Assessment: Generalized weakness(B knee ext 4/5)    Cervical / Trunk Assessment Cervical / Trunk Assessment: Other exceptions Cervical / Trunk Exceptions: nonacute type II odontoid fracture,  trunk flexed posture  Communication   Communication: No difficulties  Cognition Arousal/Alertness: Awake/alert Behavior During Therapy: WFL for tasks assessed/performed Overall Cognitive Status: Within Functional Limits for tasks assessed                                         General Comments General comments (skin integrity, edema, etc.): SaO2 80% on room air at rest, 93% on 3L O2     Exercises     Assessment/Plan    PT Assessment Patient needs continued PT services  PT Problem List Decreased strength;Decreased activity tolerance;Decreased mobility;Pain       PT Treatment Interventions Therapeutic exercise;Therapeutic activities;Patient/family education;Gait training;Functional mobility training    PT Goals (Current goals can be found in the Care Plan section)  Acute Rehab PT Goals Patient Stated Goal: regain strength PT Goal Formulation: With patient Time For Goal Achievement: 10/12/19 Potential to Achieve Goals: Good    Frequency Min 2X/week   Barriers to discharge        Co-evaluation PT/OT/SLP Co-Evaluation/Treatment: Yes Reason for Co-Treatment: Complexity of the patient's impairments (multi-system involvement);For patient/therapist safety;To address functional/ADL transfers PT goals addressed during session: Mobility/safety with mobility;Balance;Proper use of DME         AM-PAC PT "6 Clicks" Mobility  Outcome Measure Help needed turning from your back to your side while in a flat bed without using bedrails?: A Lot Help needed moving from lying on your back to sitting on the side of a flat bed without using bedrails?: A Lot Help needed moving to and from a bed to a chair (including a wheelchair)?: A Little Help needed standing up from a chair using your arms (e.g., wheelchair or bedside chair)?: A Little Help needed to walk in hospital room?: A Lot Help needed climbing 3-5 steps with a railing? : Total 6 Click Score: 13    End of Session Equipment Utilized During Treatment: Gait belt;Oxygen Activity Tolerance: Patient limited by fatigue Patient left: in chair;with call bell/phone within reach Nurse Communication: Mobility status PT Visit Diagnosis: Difficulty in walking, not elsewhere  classified (R26.2);Pain;History of falling (Z91.81);Muscle weakness (generalized) (M62.81)    Time: [id number] PT Time Calculation (min) (ACUTE ONLY): 29 min   Charges:   PT Evaluation $PT Eval Moderate Complexity: 1 Mod          Philomena Doheny PT 09/21/2019  Acute Rehabilitation Services Pager [phone] Office [phone]

## 2019-09-21 NOTE — Progress Notes (Signed)
OT Cancellation Note  Patient Details Name: [name] MRN: [id number] DOB: 08-30-1931   Cancelled Treatment:    Reason Eval/Treat Not Completed: Patient at procedure or test/ unavailable, Echo Lab. Plan to reattempt.  Tyrone Schimke, OT Acute Rehabilitation Services Pager: [phone] Office: [phone]  09/21/2019, 9:25 AM

## 2019-09-21 NOTE — Evaluation (Signed)
Occupational Therapy Evaluation Patient Details Name: [name] MRN: [id number] DOB: [birth year] Today's Date: 09/21/2019    History of Present Illness 85 y.o. female with medical history significant of CHF, CKD stage III, CAD, diabetes, morbid obesity admitted with fall, confusion, UTI. Imaging showed old type II odontoid fracture, neuro recommended cervical collar.   Clinical Impression   Pt admitted with the above diagnoses and presents with below problem list. Pt will benefit from continued acute OT to address the below listed deficits and maximize independence with basic ADLs prior to d/c to venue below. At baseline pt is mod I with ADLs. Pt currently +2 mod A with bed mobility, min - mod A with UB ADLs, mod A +2 for safety with LB ADLs, min A with toilet transfers completed as SPT to BSC. Anticipate pt will initially need +2 for chair follow to progress functional mobility.      Follow Up Recommendations  SNF    Equipment Recommendations  Other (comment)(defer to next venue)    Recommendations for Other Services       Precautions / Restrictions Precautions Precautions: Cervical;Fall Precaution Booklet Issued: No Required Braces or Orthoses: Cervical Brace Cervical Brace: Hard collar;Other (comment)(ok to be off while in bed, to walk to bathroom, and to shower) Restrictions Weight Bearing Restrictions: No      Mobility Bed Mobility Overal bed mobility: Needs Assistance Bed Mobility: Rolling;Sidelying to Sit Rolling: Mod assist;+2 for physical assistance Sidelying to sit: Mod assist;+2 for physical assistance       General bed mobility comments: assistance to initiate roll and to raise trunk  Transfers Overall transfer level: Needs assistance Equipment used: Rolling walker (2 wheeled) Transfers: Sit to/from Stand Sit to Stand: Min assist;+2 safety/equipment         General transfer comment: assist to rise    Balance Overall balance assessment: Needs  assistance   Sitting balance-Leahy Scale: Fair     Standing balance support: Bilateral upper extremity supported Standing balance-Leahy Scale: Poor                             ADL either performed or assessed with clinical judgement   ADL Overall ADL's : Needs assistance/impaired Eating/Feeding: Set up;Sitting   Grooming: Set up;Minimal assistance;Sitting   Upper Body Bathing: Moderate assistance;Sitting   Lower Body Bathing: Sit to/from stand;Moderate assistance;+2 for safety/equipment   Upper Body Dressing : Moderate assistance;Sitting   Lower Body Dressing: Sit to/from stand;Moderate assistance;+2 for safety/equipment   Toilet Transfer: Stand-pivot;BSC;RW;+2 for safety/equipment;Minimal assistance   Toileting- Clothing Manipulation and Hygiene: Sit to/from stand;+2 for safety/equipment;Moderate assistance         General ADL Comments: Pt completed bed mobility, sat EBO a few minutes then pivotal steps to sit up in recliner. Donned cervical brace sitting EOB and left on throughout session.     Vision         Perception     Praxis      Pertinent Vitals/Pain Pain Assessment: Faces Faces Pain Scale: Hurts even more Pain Location: posterior neck Pain Descriptors / Indicators: Guarding;Grimacing Pain Intervention(s): Limited activity within patient's tolerance;Monitored during session;Repositioned     Hand Dominance     Extremity/Trunk Assessment Upper Extremity Assessment Upper Extremity Assessment: Generalized weakness   Lower Extremity Assessment Lower Extremity Assessment: Defer to PT evaluation   Cervical / Trunk Assessment Cervical / Trunk Assessment: Other exceptions Cervical / Trunk Exceptions: nonacute type II odontoid fracture, trunk flexed  posture   Communication Communication Communication: No difficulties   Cognition Arousal/Alertness: Awake/alert Behavior During Therapy: WFL for tasks assessed/performed Overall Cognitive  Status: Within Functional Limits for tasks assessed                                     General Comments  SaO2 80% on room air at rest, 93% on 3L O2 [place]    Exercises     Shoulder Instructions      Home Living Family/patient expects to be discharged to:: Private residence Living Arrangements: Children Available Help at Discharge: Family Type of Home: House Home Access: Stairs to enter Technical brewer of Steps: 3 Entrance Stairs-Rails: Right;Left;Can reach both [place]: Two level;Able to live on main level with bedroom/bathroom     Bathroom Shower/Tub: Occupational psychologist: Handicapped height     Home Equipment: Clinical cytogeneticist - 2 wheels          Prior Functioning/Environment Level of Independence: Independent with assistive device(s)        Comments: walks with RW, lives with son who is home 24*        OT Problem List: Decreased strength;Decreased activity tolerance;Impaired balance (sitting and/or standing);Decreased knowledge of use of DME or AE;Decreased knowledge of precautions;Impaired UE functional use;Pain;Cardiopulmonary status limiting activity      OT Treatment/Interventions: Self-care/ADL training;DME and/or AE instruction;Therapeutic activities;Patient/family education;Balance training    OT Goals(Current goals can be found in the care plan section) Acute Rehab OT Goals Patient Stated Goal: regain strength OT Goal Formulation: With patient Time For Goal Achievement: 10/05/19 Potential to Achieve Goals: Good ADL Goals Pt Will Perform Grooming: with supervision;sitting;with set-up Pt Will Perform Upper Body Bathing: with min assist;sitting Pt Will Perform Lower Body Bathing: with min assist;sit to/from stand Pt Will Transfer to Toilet: with min assist;ambulating Pt Will Perform Toileting - Clothing Manipulation and hygiene: with min assist;sit to/from stand Additional ADL Goal #1: Pt will complete bed mobility  at min A level to prepare for EOB/OOB ADLs.  OT Frequency: Min 2X/week   Barriers to D/C:            Co-evaluation PT/OT/SLP Co-Evaluation/Treatment: Yes Reason for Co-Treatment: Complexity of the patient's impairments (multi-system involvement);For patient/therapist safety;To address functional/ADL transfers PT goals addressed during session: Mobility/safety with mobility;Balance;Proper use of DME OT goals addressed during session: ADL's and self-care      AM-PAC OT "6 Clicks" Daily Activity     Outcome Measure Help from another person eating meals?: A Little Help from another person taking care of personal grooming?: A Little Help from another person toileting, which includes using toliet, bedpan, or urinal?: A Lot Help from another person bathing (including washing, rinsing, drying)?: A Lot Help from another person to put on and taking off regular upper body clothing?: A Little Help from another person to put on and taking off regular lower body clothing?: A Lot 6 Click Score: 15   End of Session Equipment Utilized During Treatment: Gait belt;Rolling walker;Cervical collar;Oxygen Nurse Communication: Mobility status  Activity Tolerance: Patient tolerated treatment well;Patient limited by pain;Patient limited by fatigue Patient left: in chair;with call bell/phone within reach  OT Visit Diagnosis: Unsteadiness on feet (R26.81);Muscle weakness (generalized) (M62.81);History of falling (Z91.81);Pain                Time: [id number] OT Time Calculation (min): 28 min Charges:  OT General Charges $OT Visit:  1 Visit OT Evaluation $OT Eval Moderate Complexity: [place], OT Acute Rehabilitation Services Pager: [phone] Office: [phone]   Hortencia Pilar 09/21/2019, 1:59 PM

## 2019-09-21 NOTE — TOC Initial Note (Signed)
Transition of Care [facility]) - Initial/Assessment Note    Patient Details  Name: [name] MRN: [id number] Date of Birth: 02-May-1931  Transition of Care [facility]) CM/SW Contact:    Bethena Roys, RN Phone Number: 09/21/2019, 4:41 PM  Clinical Narrative: Pt presented for Acute on Chronic CHF- PTA from home with the support of son. Patient states that son drives her to appointments and gets her medications. Patient states she uses a RW in the home and a cane. Patient has been to [facility] in the past and states she wants to think about it. Medicare.gov list provided to patient regarding facilities. Patient wants to discuss with son regarding SNF. CM will speak with patient in am regarding decision for SNF vs Home with [facility].                  Expected Discharge Plan: Skilled Nursing Facility Barriers to Discharge: Continued Medical Work up   Patient Goals and CMS Choice Patient states their goals for this hospitalization and ongoing recovery are:: "to get better" CMS Medicare.gov Compare Post Acute Care list provided to:: Patient Choice offered to / list presented to : Patient  Expected Discharge Plan and Services Expected Discharge Plan: [place] In-house Referral: NA Discharge Planning Services: CM Consult Post Acute Care Choice: [place] Living arrangements for the past 2 months: Single Family Home                  Prior Living Arrangements/Services Living arrangements for the past 2 months: Single Family Home Lives with:: Adult Children(son lives with the patient- he does not work.) Patient language and need for interpreter reviewed:: Yes Do you feel safe going back to the place where you live?: Yes      Need for Family Participation in Patient Care: Yes (Comment) Care giver support system in place?: Yes (comment)   Criminal Activity/Legal Involvement Pertinent to Current Situation/Hospitalization: No - Comment as  needed  Activities of Daily Living Home Assistive Devices/Equipment: Walker (specify type) ADL Screening (condition at time of admission) Patient's cognitive ability adequate to safely complete daily activities?: Yes Is the patient deaf or have difficulty hearing?: No Does the patient have difficulty seeing, even when wearing glasses/contacts?: Yes(wears glasses) Does the patient have difficulty concentrating, remembering, or making decisions?: No Patient able to express need for assistance with ADLs?: Yes Does the patient have difficulty dressing or bathing?: Yes Independently performs ADLs?: Yes (appropriate for developmental age) Does the patient have difficulty walking or climbing stairs?: Yes Weakness of Legs: Both Weakness of Arms/Hands: None  Permission Sought/Granted Permission sought to share information with : Family Supports, Chartered certified accountant granted to share information with : Yes, Verbal Permission Granted              Emotional Assessment Appearance:: Appears stated age Attitude/Demeanor/Rapport: Engaged Affect (typically observed): Accepting Orientation: : Oriented to Situation, Oriented to  Time, Oriented to Place, Oriented to Self Alcohol / Substance Use: Not Applicable Psych Involvement: No (comment)  Admission diagnosis:  Acute cystitis without hematuria [N30.00] Acute on chronic congestive heart failure, unspecified heart failure type [facility]) [I50.9] Patient Active Problem List   Diagnosis Date Noted  . Pressure injury of skin stage II to buttocks 09/21/2019  . Benign hypertension with CKD (chronic kidney disease) stage III 09/21/2019  . Obesity (BMI 30-39.9) 09/21/2019  . UTI due to Klebsiella species 09/21/2019  . Acute kidney injury superimposed on CKD ([place]) 09/20/2019  . Acute  metabolic encephalopathy 123456  . Anemia of chronic renal failure, stage 2 (mild) 02/18/2017  . Diabetic polyneuropathy associated with type 2  diabetes mellitus ([place]) 02/18/2017  . Vitamin D deficiency 02/18/2017  . Acute on chronic diastolic CHF (congestive heart failure) ([place])   . Pulmonary hypertension ([place])   . CKD (chronic kidney disease) stage 3, GFR 30-59 ml/min   . Hypertensive retinopathy of both eyes 09/26/2015  . Pseudophakia of both eyes 09/26/2015  . Uncontrolled diabetes mellitus ([place]) 06/07/2014  . Chronic diastolic CHF (congestive heart failure) ([place]) 03/18/2011  . Mixed dyslipidemia 03/18/2011  . Hypertension, essential    PCP:  Hoyt Koch, MD Pharmacy:   [facility], [place] [id number] [place] [id number] Phone: [phone] Fax: [phone]     Social Determinants of Health (SDOH) Interventions    Readmission Risk Interventions No flowsheet data found.

## 2019-09-22 DIAGNOSIS — E160 Drug-induced hypoglycemia without coma: Secondary | ICD-10-CM

## 2019-09-22 DIAGNOSIS — T383X5A Adverse effect of insulin and oral hypoglycemic [antidiabetic] drugs, initial encounter: Secondary | ICD-10-CM

## 2019-09-22 LAB — BASIC METABOLIC PANEL
Anion gap: 10 (ref 5–15)
BUN: 45 mg/dL — ABNORMAL HIGH (ref 8–23)
CO2: 30 mmol/L (ref 22–32)
Calcium: 8.7 mg/dL — ABNORMAL LOW (ref 8.9–10.3)
Chloride: 98 mmol/L (ref 98–111)
Creatinine, Ser: 1.44 mg/dL — ABNORMAL HIGH (ref 0.44–1.00)
GFR calc Af Amer: 38 mL/min — ABNORMAL LOW (ref >60)
GFR calc non Af Amer: 33 mL/min — ABNORMAL LOW (ref >60)
Glucose, Bld: 41 mg/dL — CL (ref 70–99)
Potassium: 4.3 mmol/L (ref 3.5–5.1)
Sodium: 138 mmol/L (ref 135–145)

## 2019-09-22 LAB — GLUCOSE, CAPILLARY
Glucose-Capillary: 112 mg/dL — ABNORMAL HIGH (ref 70–99)
Glucose-Capillary: 34 mg/dL — CL (ref 70–99)
Glucose-Capillary: 57 mg/dL — ABNORMAL LOW (ref 70–99)
Glucose-Capillary: 98 mg/dL (ref 70–99)
Glucose-Capillary: 81 mg/dL (ref 70–99)
Glucose-Capillary: 69 mg/dL — ABNORMAL LOW (ref 70–99)
Glucose-Capillary: 93 mg/dL (ref 70–99)

## 2019-09-22 LAB — SARS CORONAVIRUS 2 (TAT 6-24 HRS): SARS Coronavirus 2: NEGATIVE

## 2019-09-22 MED ORDER — MICONAZOLE NITRATE 2 % VA CREA
1.0000 | TOPICAL_CREAM | Freq: Every day | VAGINAL | Status: DC
  Administered 2019-09-22: 1 via VAGINAL
  Filled 2019-09-22: qty 45

## 2019-09-22 MED ORDER — PNEUMOCOCCAL VAC POLYVALENT 25 MCG/0.5ML IJ INJ
0.5000 mL | INJECTION | INTRAMUSCULAR | Status: AC
  Administered 2019-09-24: 0.5 mL via INTRAMUSCULAR
  Filled 2019-09-22: qty 0.5

## 2019-09-22 MED ORDER — DEXTROSE 50 % IV SOLN
INTRAVENOUS | Status: AC
  Administered 2019-09-22: 25 mL
  Filled 2019-09-22: qty 50

## 2019-09-22 MED ORDER — HYDROCODONE-ACETAMINOPHEN 5-325 MG PO TABS
1.0000 | ORAL_TABLET | Freq: Four times a day (QID) | ORAL | Status: DC | PRN
  Administered 2019-09-22: 1 via ORAL
  Filled 2019-09-22: qty 1

## 2019-09-22 MED ORDER — INSULIN GLARGINE 100 UNIT/ML ~~LOC~~ SOLN
12.0000 [IU] | Freq: Every day | SUBCUTANEOUS | Status: DC
  Administered 2019-09-22: 12 [IU] via SUBCUTANEOUS
  Filled 2019-09-22 (×2): qty 0.12

## 2019-09-22 MED ORDER — CEPHALEXIN 250 MG PO CAPS
250.0000 mg | ORAL_CAPSULE | Freq: Two times a day (BID) | ORAL | Status: DC
  Administered 2019-09-22 – 2019-09-24 (×5): 250 mg via ORAL
  Filled 2019-09-22 (×6): qty 1

## 2019-09-22 NOTE — Progress Notes (Signed)
Inpatient Diabetes Program Recommendations  AACE/ADA: New Consensus Statement on Inpatient Glycemic Control (2015)  Target Ranges:  Prepandial:   less than 140 mg/dL      Peak postprandial:   less than 180 mg/dL (1-2 hours)      Critically ill patients:  140 - 180 mg/dL   Lab Results  Component Value Date   GLUCAP 98 09/22/2019   HGBA1C 7.3 03/22/2019    Review of Glycemic Control  Diabetes history: DM2 Outpatient Diabetes medications: Lantus 25 units QHS, Novolog 3-5 units tid, glipizide 5 mg QAM Current orders for Inpatient glycemic control: Lantus 20 units QHS, Novolog 0-9 units tidwc  HgbA1C - 7.3% Hypoglycemia this am 34-57 mg/dL.  Inpatient Diabetes Program Recommendations:     Reduce Lantus to 12 units QHS.  Follow closely.  Thank you. Lorenda Peck, RD, LDN, CDE Inpatient Diabetes Coordinator [phone]

## 2019-09-22 NOTE — NC FL2 (Signed)
[place] LEVEL OF CARE SCREENING TOOL     IDENTIFICATION  Patient Name: [name] Birthdate: 1931-10-28 Sex: female Admission Date (Current Location): 09/20/2019  [facility] and [place] Number:  Herbalist and Address:  [facility], [place] [address], [place], [place] [address]      Provider Number: [id number]  Attending Physician Name and Address:  Rama, Venetia Maxon, MD  Relative Name and Phone Number:       Current Level of Care: Hospital Recommended Level of Care: [place] Prior Approval Number:    Date Approved/Denied:   PASRR Number: [id number]  Discharge Plan: SNF    Current Diagnoses: Patient Active Problem List   Diagnosis Date Noted  . Hypoglycemia due to insulin 09/22/2019  . Pressure injury of skin stage II to buttocks 09/21/2019  . Benign hypertension with CKD (chronic kidney disease) stage III 09/21/2019  . Obesity (BMI 30-39.9) 09/21/2019  . UTI due to Klebsiella species 09/21/2019  . Acute kidney injury superimposed on CKD ([place]) 09/20/2019  . Acute metabolic encephalopathy 123456  . Anemia of chronic renal failure, stage 2 (mild) 02/18/2017  . Diabetic polyneuropathy associated with type 2 diabetes mellitus ([place]) 02/18/2017  . Vitamin D deficiency 02/18/2017  . Acute on chronic diastolic CHF (congestive heart failure) ([place])   . Pulmonary hypertension ([place])   . CKD (chronic kidney disease) stage 3, GFR 30-59 ml/min   . Hypertensive retinopathy of both eyes 09/26/2015  . Pseudophakia of both eyes 09/26/2015  . Uncontrolled diabetes mellitus ([place]) 06/07/2014  . Chronic diastolic CHF (congestive heart failure) ([place]) 03/18/2011  . Mixed dyslipidemia 03/18/2011  . Hypertension, essential     Orientation RESPIRATION BLADDER Height & Weight     Self, Time, Situation, Place  O2([place]- 3L) External catheter, Incontinent(placed 11/16) Weight: 198 lb 3.1 oz (89.9 kg) Height:  5\' 4"  (162.6 cm)   BEHAVIORAL SYMPTOMS/MOOD NEUROLOGICAL BOWEL NUTRITION STATUS      Incontinent Diet(heart healthy)  AMBULATORY STATUS COMMUNICATION OF NEEDS Skin   Limited Assist Verbally PU Stage and Appropriate Care   PU Stage 2 Dressing: (buttocks, foam dressing, change PRN)                   Personal Care Assistance Level of Assistance  Bathing, Feeding, Dressing Bathing Assistance: Limited assistance Feeding assistance: Independent Dressing Assistance: Limited assistance     Functional Limitations Info  Sight, Hearing, Speech Sight Info: Adequate Hearing Info: Adequate Speech Info: Adequate    SPECIAL CARE FACTORS FREQUENCY  OT (By licensed OT), PT (By licensed PT)     PT Frequency: 5x OT Frequency: 5x            Contractures Contractures Info: Not present    Additional Factors Info  Code Status, Allergies Code Status Info: DNR Allergies Info: Atenolol, Codeine, Lactose Intolerance (Gi), Lodine (Etodolac), Sulfa Drugs Cross Reactors, Sulfamethoxazole, Benzonatate, Escitalopram Oxalate, Metformin, Oxycodone-acetaminophen, Pregabalin           Current Medications (09/22/2019):  This is the current hospital active medication list Current Facility-Administered Medications  Medication Dose Route Frequency Provider Last Rate Last Dose  . 0.9 %  sodium chloride infusion  250 mL Intravenous PRN Mariel Aloe, MD      . acetaminophen (TYLENOL) tablet 650 mg  650 mg Oral Q4H PRN Mariel Aloe, MD   650 mg at 09/22/19 0603  . amLODipine (NORVASC) tablet 5 mg  5 mg Oral Daily Cordelia Poche  A, MD   5 mg at 09/22/19 0829  . aspirin EC tablet 81 mg  81 mg Oral q morning - 10a Mariel Aloe, MD   81 mg at 09/22/19 0825  . atorvastatin (LIPITOR) tablet 40 mg  40 mg Oral q morning - 10a Mariel Aloe, MD   40 mg at 09/22/19 0825  . cephALEXin (KEFLEX) capsule 250 mg  250 mg Oral Q12H Rama, Christina P, MD      . furosemide (LASIX) injection 80 mg  80 mg Intravenous Daily Mariel Aloe, MD   80 mg at 09/22/19 0823  . gabapentin (NEURONTIN) capsule 300 mg  300 mg Oral QHS Mariel Aloe, MD   300 mg at 09/21/19 2151  . heparin injection 5,000 Units  5,000 Units Subcutaneous Q8H Mariel Aloe, MD   5,000 Units at 09/22/19 [id number]  . hydrALAZINE (APRESOLINE) tablet 25 mg  25 mg Oral BID Mariel Aloe, MD   25 mg at 09/22/19 0829  . insulin aspart (novoLOG) injection 0-9 Units  0-9 Units Subcutaneous TID WC Mariel Aloe, MD   2 Units at 09/21/19 1225  . insulin glargine (LANTUS) injection 12 Units  12 Units Subcutaneous QHS Rama, Christina P, MD      . metoprolol tartrate (LOPRESSOR) tablet 25 mg  25 mg Oral BID Mariel Aloe, MD   25 mg at 09/22/19 0829  . ondansetron (ZOFRAN) injection 4 mg  4 mg Intravenous Q6H PRN Mariel Aloe, MD   4 mg at 09/20/19 1918  . sodium chloride flush (NS) 0.9 % injection 3 mL  3 mL Intravenous Q12H Mariel Aloe, MD   3 mL at 09/22/19 0825  . sodium chloride flush (NS) 0.9 % injection 3 mL  3 mL Intravenous PRN Mariel Aloe, MD      . traMADol Veatrice [place]bon) tablet 50 mg  50 mg Oral Q8H PRN Mariel Aloe, MD   50 mg at 09/22/19 0310     Discharge Medications: Please see discharge summary for a list of discharge medications.  Relevant Imaging Results:  Relevant Lab Results:   Additional Information [id number]  Gerrianne Scale Pryor Guettler, LCSW

## 2019-09-22 NOTE — TOC Progression Note (Signed)
Transition of Care [facility]) - Progression Note    Patient Details  Name: [name] MRN: [id number] Date of Birth: December 09, 1930  [facility]) CM/SW Contact  Eileen Stanford, LCSW Phone Number: 09/22/2019, 1:38 PM  Clinical Narrative:  CSW received a call back from pt's son, [name]. At this time he is only interested in [place] or [place]. Pt's son has done reviews to determine Covid cases and deaths per each facility. Pt's son states he talked to the MD and she stated earliest d/c would be Friday. CSW will follow up with each facility. Pt's son states he will look in to more facilities as we wait.      Expected Discharge Plan: [place] Barriers to Discharge: Continued Medical Work up  Expected Discharge Plan and Services Expected Discharge Plan: [place] In-house Referral: NA Discharge Planning Services: CM Consult Post Acute Care Choice: [place] Living arrangements for the past 2 months: Single Family Home                                       Social Determinants of Health (SDOH) Interventions    Readmission Risk Interventions No flowsheet data found.

## 2019-09-22 NOTE — Progress Notes (Signed)
Hypoglycemic Event  CBG: 34  Treatment: D50 25 mL (12.5 gm)  Symptoms: Pale  Follow-up CBG: Time:0558 CBG Result:112  Possible Reasons for Event: Inadequate meal intake, medication regimen  Comments/MD notified:per protocol    Mitsuru Dault L

## 2019-09-22 NOTE — Progress Notes (Signed)
Progress Note    [name]  [id number] DOB: 1931-06-15  DOA: 09/20/2019 PCP: Hoyt Koch, MD    Brief Narrative:   Chief complaint: Follow-up confusion.  Medical records reviewed and are as summarized below:  [name] is an 85 y.o. female with a PMH of chronic diastolic CHF, stage III CKD, CAD, type II DM with neurological complications, morbid obesity and HTN who was admitted with a 2 day h/o worsening confustion in the setting of recently being treated for a UTI with Keflex. Work up in the ED showed a BNP of 1277 and a CXR significant for cardiomegaly with vascular congestion.  She was placed on IV Lasix and IV Rocephin.  Assessment/Plan:   Principal Problem:   Acute on chronic diastolic CHF (congestive heart failure) (HCC) associated with hypoxia requiring supplemental oxygen The patient was noted to require 3 L of oxygen via nasal cannula on admission and had a documented oxygen saturation of 71% on room air, which improved to the low 90's on oxygen.  Chest x-ray consistent with acute CHF.  Placed on Lasix 80 mg IV daily. Last 2D echo done 10/08/2016 and showed an EF of 55-60% with pseudonormal filling pressures consistent with grade 2 diastolic dysfunction.  Repeat echo essentially unchanged.  I/O- 1.1 L overnight.  Weight down 3 kg.  This morning, she is very difficult to awaken.  Review of telemetry reveals occasional premature atrial contractions.  Oxygen saturation is still in the low 90s on 3 L.  Continue IV diuresis with Lasix.  Remains volume overloaded.  Active Problems:   Hypertension, essential Continue Lasix, Norvasc, hydralazine, and metoprolol.  Blood pressure currently controlled.    Diabetic polyneuropathy associated with poorly controlled type 2 diabetes mellitus (HCC)/hypoglycemia Hemoglobin A1c 7.3% on 03/22/2019.  Currently being managed with 20 units of Lantus and insulin sensitive SSI 3 times daily.  CBGs 34-122.  Reduce Lantus to 12 units.   Continue Neurontin for polyneuropathy.    Acute kidney injury in the setting of known CKD stage III B  (HCC) Baseline creatinine appears to be around 1.4.  Current creatinine elevated over usual baseline values. GFR consistent with stage III B CKD at baseline (30).  Current creatinine back to baseline values despite diuresis.      Acute metabolic encephalopathy Patient's mental status appears to be at baseline.    Pressure injury of skin buttocks, Stage II -  Partial thickness loss of dermis presenting as a shallow open ulcer with a red, pink wound bed without slough, present on admission Wound care per nursing.    Klebsiella UTI Urine cultures from 09/18/2019 grew Klebsiella, Rocephin sensitive.  Narrow antibiotics to cephalexin.    Syncope/fall resulting in a nondisplaced transverse type II fracture of the odontoid process Fall occurred on 09/18/2019. CT of her cervical spine with a nonacute nondisplaced transverse type II fracture of the odontoid process, and it appears she was evaluated in the ED at that time.  Neurosurgery saw her on 09/20/2019 and reported that she is not a candidate for surgical intervention.  Continue immobilization.  PT/OT evaluations completed and SNF recommended. Having a lot of pain in the rigid collar, and is taking Tramadol for pain.   Will order a soft collar to see if that is better tolerated.    Hyperlipidemia Continue atorvastatin.    Obesity BMI 30-39.9 Body mass index is 35.19 kg/m.   Family Communication/Anticipated D/C date and plan/Code Status   DVT prophylaxis: Heparin  ordered. Code Status: DNR.  Family Communication: Left message for son. Disposition Plan: SNF when fully diuresed and no longer requiring oxygen.  Medical Consultants:    None.   Anti-Infectives:    Rocephin 09/20/19---> 09/22/2019  Cephalexin 09/22/2019--->  Subjective:   [name] is very sleepy today and I am unable to sufficiently arouse her to interact with  me.  RN reports having a lot of neck pain and has had a poor appetite.  Objective:    Vitals:   09/21/19 2151 09/22/19 0315 09/22/19 0318 09/22/19 0829  BP:   (!) 139/54 134/84  Pulse: 63  (!) 59   Resp:   17   Temp:   98.5 F (36.9 C)   TempSrc:   Oral   SpO2:   93%   Weight:  89.9 kg    Height:        Intake/Output Summary (Last 24 hours) at 09/22/2019 1031 Last data filed at 09/22/2019 0317 Gross per 24 hour  Intake 342 ml  Output 1450 ml  Net -1108 ml   Filed Weights   09/20/19 1238 09/21/19 0524 09/22/19 0315  Weight: 90.7 kg 93 kg 89.9 kg    Exam: General: No acute distress.  Very sleepy. Cardiovascular: Heart sounds show a regular rate, and rhythm. No gallops or rubs. No murmurs. No JVD. Lungs: Diminished throughout, unable to get a good exam as she is sleeping and cannot listen to her back. No rales, rhonchi or wheezes. Abdomen: Soft, nontender, nondistended with normal active bowel sounds. No masses. No hepatosplenomegaly. Neurological: Lethargic and unable to sufficiently awaken to assess.  Skin: Warm and dry.  Stage II right-sided decubitus ulcer as noted below. Extremities: No clubbing or cyanosis. 1+ edema. Pedal pulses 2+. Psychiatric: Unable to assess.   Pressure Injury 09/20/19 Buttocks Right Stage II -  Partial thickness loss of dermis presenting as a shallow open ulcer with a red, pink wound bed without slough. (Active)  09/20/19 2300  Location: Buttocks  Location Orientation: Right  Staging: Stage II -  Partial thickness loss of dermis presenting as a shallow open ulcer with a red, pink wound bed without slough.  Wound Description (Comments):   Present on Admission: Yes    Data Reviewed:   I have personally reviewed following labs and imaging studies:  Labs: Labs show the following:   Basic Metabolic Panel: Recent Labs  Lab 09/18/19 1708 09/20/19 1347 09/21/19 0316 09/22/19 0417  NA 136 133* 135 138  K 5.3* 5.6* 4.8 4.3  CL 101 92*  96* 98  CO2 27 24 29 30   GLUCOSE 135* 303* 126* 41*  BUN 43* 51* 51* 45*  CREATININE 1.81* 1.80* 1.78* 1.44*  CALCIUM 8.7* 8.8* 8.7* 8.7*   GFR Estimated Creatinine Clearance: 30.5 mL/min (A) (by C-G formula based on SCr of 1.44 mg/dL (H)). Liver Function Tests: Recent Labs  Lab 09/20/19 1347  AST 20  ALT 17  ALKPHOS 74  BILITOT 0.6  PROT 6.3*  ALBUMIN 3.2*   CBC: Recent Labs  Lab 09/18/19 1708 09/20/19 1347  WBC 11.8* 13.4*  NEUTROABS 9.8* 11.5*  HGB 13.3 12.1  HCT 45.0 39.3  MCV 91.8 89.1  PLT 85* 235   CBG: Recent Labs  Lab 09/21/19 2135 09/22/19 0539 09/22/19 0558 09/22/19 0748 09/22/19 0832  GLUCAP 122* 34* 112* 57* 98   Microbiology Recent Results (from the past 240 hour(s))  SARS CORONAVIRUS 2 (TAT 6-24 HRS) Nasopharyngeal Nasopharyngeal Swab     Status: None  Collection Time: 09/18/19  3:19 PM   Specimen: Nasopharyngeal Swab  Result Value Ref Range Status   SARS Coronavirus 2 NEGATIVE NEGATIVE Final    Comment: (NOTE) SARS-CoV-2 target nucleic acids are NOT DETECTED. The SARS-CoV-2 RNA is generally detectable in upper and lower respiratory specimens during the acute phase of infection. Negative results do not preclude SARS-CoV-2 infection, do not rule out co-infections with other pathogens, and should not be used as the sole basis for treatment or other patient management decisions. Negative results must be combined with clinical observations, patient history, and epidemiological information. The expected result is Negative. Fact Sheet for Patients: SugarRoll.be Fact Sheet for Healthcare Providers: https://www.woods-mathews.com/ This test is not yet approved or cleared by the [place] FDA and  has been authorized for detection and/or diagnosis of SARS-CoV-2 by FDA under an Emergency Use Authorization (EUA). This EUA will remain  in effect (meaning this test can be used) for the duration of the  COVID-19 declaration under Section 56 4(b)(1) of the Act, 21 U.S.C. section 360bbb-3(b)(1), unless the authorization is terminated or revoked sooner. Performed at [facility], [place] [address] [place], [place] [address]   Urine culture     Status: Abnormal   Collection Time: 09/18/19  8:51 PM   Specimen: Urine, Random  Result Value Ref Range Status   Specimen Description   Final    URINE, RANDOM Performed at [place] [address] [place], [place] [address]    Special Requests   Final    NONE Performed at [facility], [place] [address] [place], [place] [address]    Culture >=100,000 COLONIES/mL KLEBSIELLA PNEUMONIAE (A)  Final   Report Status 09/21/2019 FINAL  Final   Organism ID, Bacteria KLEBSIELLA PNEUMONIAE (A)  Final      Susceptibility   Klebsiella pneumoniae - MIC*    AMPICILLIN >=32 RESISTANT Resistant     CEFAZOLIN <=4 SENSITIVE Sensitive     CEFTRIAXONE <=1 SENSITIVE Sensitive     CIPROFLOXACIN <=0.25 SENSITIVE Sensitive     GENTAMICIN <=1 SENSITIVE Sensitive     IMIPENEM <=0.25 SENSITIVE Sensitive     NITROFURANTOIN 64 INTERMEDIATE Intermediate     TRIMETH/SULFA <=20 SENSITIVE Sensitive     AMPICILLIN/SULBACTAM 4 SENSITIVE Sensitive     PIP/TAZO <=4 SENSITIVE Sensitive     Extended ESBL NEGATIVE Sensitive     * >=100,000 COLONIES/mL KLEBSIELLA PNEUMONIAE  SARS CORONAVIRUS 2 (TAT 6-24 HRS) Nasopharyngeal Nasopharyngeal Swab     Status: None   Collection Time: 09/20/19  2:03 PM   Specimen: Nasopharyngeal Swab  Result Value Ref Range Status   SARS Coronavirus 2 NEGATIVE NEGATIVE Final    Comment: (NOTE) SARS-CoV-2 target nucleic acids are NOT DETECTED. The SARS-CoV-2 RNA is generally detectable in upper and lower respiratory specimens during the acute phase of infection. Negative results do not preclude SARS-CoV-2 infection, do not rule out co-infections with other pathogens, and should not be used as the sole  basis for treatment or other patient management decisions. Negative results must be combined with clinical observations, patient history, and epidemiological information. The expected result is Negative. Fact Sheet for Patients: SugarRoll.be Fact Sheet for Healthcare Providers: https://www.woods-mathews.com/ This test is not yet approved or cleared by the [place] FDA and  has been authorized for detection and/or diagnosis of SARS-CoV-2 by FDA under an Emergency Use Authorization (EUA). This EUA will remain  in effect (meaning this test can be used) for the duration of the  COVID-19 declaration under Section 56 4(b)(1) of the Act, 21 U.S.C. section 360bbb-3(b)(1), unless the authorization is terminated or revoked sooner. Performed at [facility], [place] [address], [place], [place] [address]   Blood culture (routine x 2)     Status: None (Preliminary result)   Collection Time: 09/20/19  2:50 PM   Specimen: BLOOD  Result Value Ref Range Status   Specimen Description BLOOD RIGHT ANTECUBITAL  Final   Special Requests   Final    BOTTLES DRAWN AEROBIC AND ANAEROBIC Blood Culture adequate volume   Culture   Final    NO GROWTH < 24 HOURS Performed at [facility], [place] [address] [place], [place] [address]    Report Status PENDING  Incomplete  Blood culture (routine x 2)     Status: None (Preliminary result)   Collection Time: 09/20/19  3:00 PM   Specimen: BLOOD  Result Value Ref Range Status   Specimen Description BLOOD RIGHT ANTECUBITAL  Final   Special Requests   Final    BOTTLES DRAWN AEROBIC AND ANAEROBIC Blood Culture adequate volume   Culture   Final    NO GROWTH < 24 HOURS Performed at [facility], [place] [address] [place], [place] [address]    Report Status PENDING  Incomplete    Procedures and diagnostic studies:  Ct Head Wo Contrast  Result Date: 09/20/2019 CLINICAL DATA:  Altered level of consciousness  EXAM: CT HEAD WITHOUT CONTRAST TECHNIQUE: Contiguous axial images were obtained from the base of the skull through the vertex without intravenous contrast. COMPARISON:  09/18/2019 FINDINGS: Brain: Mild chronic microvascular disease throughout the deep white matter. No acute intracranial abnormality. Specifically, no hemorrhage, hydrocephalus, mass lesion, acute infarction, or significant intracranial injury. Vascular: No hyperdense vessel or unexpected calcification. Skull: No acute calvarial abnormality. Sinuses/Orbits: Visualized paranasal sinuses and mastoids clear. Orbital soft tissues unremarkable. Other: None IMPRESSION: Chronic small vessel disease.  No acute intracranial abnormality. Electronically Signed   By: Rolm Baptise M.D.   On: 09/20/2019 20:18   Dg Chest Portable 1 View  Result Date: 09/20/2019 CLINICAL DATA:  85 year old female with shortness of breath. EXAM: PORTABLE CHEST 1 VIEW COMPARISON:  Chest radiograph dated 09/18/2019. FINDINGS: Mild cardiomegaly with mild vascular congestion. No focal consolidation or pneumothorax. No significant pleural effusion. Atherosclerotic calcification of the aorta. No acute osseous pathology. IMPRESSION: Mild cardiomegaly with mild vascular congestion. No focal consolidation. Electronically Signed   By: Anner Crete M.D.   On: 09/20/2019 14:04    Medications:   . amLODipine  5 mg Oral Daily  . aspirin EC  81 mg Oral q morning - 10a  . atorvastatin  40 mg Oral q morning - 10a  . cephALEXin  250 mg Oral Q12H  . furosemide  80 mg Intravenous Daily  . gabapentin  300 mg Oral QHS  . heparin  5,000 Units Subcutaneous Q8H  . hydrALAZINE  25 mg Oral BID  . insulin aspart  0-9 Units Subcutaneous TID WC  . insulin glargine  20 Units Subcutaneous QHS  . metoprolol tartrate  25 mg Oral BID  . sodium chloride flush  3 mL Intravenous Q12H   Continuous Infusions: . sodium chloride       LOS: 2 days   Jacquelynn Cree  [facility] Pager  [phone].   [facility] How to contact the [facility] Attending or Consulting provider [place] or covering provider during after hours [place], for this patient?  1. Check the care team in [facility] and look for a) attending/consulting [facility] provider listed and b) the [facility] team listed 2. Log into [email or web address] and use [place]'s universal password to access. If you do not have the password, please contact the hospital operator. 3. Locate the [facility] provider you are looking for under [facility] and page to a number that you can be directly reached. 4. If you still have difficulty reaching the provider, please page the [facility] (Director on Call) for the Hospitalists listed on amion for assistance.  09/22/2019, 10:31 AM

## 2019-09-22 NOTE — Progress Notes (Signed)
Orthopedic Tech Progress Note Patient Details:  [name] [birth year] [id number]  Ortho Devices Type of Ortho Device: Soft collar Ortho Device/Splint Location: neck Ortho Device/Splint Interventions: Ordered, Application, Adjustment   Post Interventions Patient Tolerated: Fair Instructions Provided: Adjustment of device, Care of device   [name] 09/22/2019, 3:30 PM

## 2019-09-23 DIAGNOSIS — I5033 Acute on chronic diastolic (congestive) heart failure: Secondary | ICD-10-CM

## 2019-09-23 LAB — URINALYSIS, ROUTINE W REFLEX MICROSCOPIC
Bilirubin Urine: NEGATIVE
Glucose, UA: NEGATIVE mg/dL
Hgb urine dipstick: NEGATIVE
Ketones, ur: NEGATIVE mg/dL
Leukocytes,Ua: NEGATIVE
Nitrite: NEGATIVE
Protein, ur: NEGATIVE mg/dL
Specific Gravity, Urine: 1.008 (ref 1.005–1.030)
pH: 6 (ref 5.0–8.0)

## 2019-09-23 LAB — BASIC METABOLIC PANEL
Anion gap: 12 (ref 5–15)
BUN: 36 mg/dL — ABNORMAL HIGH (ref 8–23)
CO2: 32 mmol/L (ref 22–32)
Calcium: 8.9 mg/dL (ref 8.9–10.3)
Chloride: 94 mmol/L — ABNORMAL LOW (ref 98–111)
Creatinine, Ser: 1.22 mg/dL — ABNORMAL HIGH (ref 0.44–1.00)
GFR calc Af Amer: 46 mL/min — ABNORMAL LOW (ref >60)
GFR calc non Af Amer: 40 mL/min — ABNORMAL LOW (ref >60)
Glucose, Bld: 86 mg/dL (ref 70–99)
Potassium: 4.7 mmol/L (ref 3.5–5.1)
Sodium: 138 mmol/L (ref 135–145)

## 2019-09-23 LAB — CBC
HCT: 37.7 % (ref 36.0–46.0)
Hemoglobin: 11.6 g/dL — ABNORMAL LOW (ref 12.0–15.0)
MCH: 27.4 pg (ref 26.0–34.0)
MCHC: 30.8 g/dL (ref 30.0–36.0)
MCV: 89.1 fL (ref 80.0–100.0)
Platelets: 234 10*3/uL (ref 150–400)
RBC: 4.23 MIL/uL (ref 3.87–5.11)
RDW: 13.8 % (ref 11.5–15.5)
WBC: 9.2 10*3/uL (ref 4.0–10.5)
nRBC: 0 % (ref 0.0–0.2)

## 2019-09-23 LAB — GLUCOSE, CAPILLARY
Glucose-Capillary: 138 mg/dL — ABNORMAL HIGH (ref 70–99)
Glucose-Capillary: 79 mg/dL (ref 70–99)
Glucose-Capillary: 136 mg/dL — ABNORMAL HIGH (ref 70–99)
Glucose-Capillary: 130 mg/dL — ABNORMAL HIGH (ref 70–99)
Glucose-Capillary: 189 mg/dL — ABNORMAL HIGH (ref 70–99)

## 2019-09-23 MED ORDER — GLUCERNA SHAKE PO LIQD: 237.0000 mL | Freq: Three times a day (TID) | ORAL | Status: DC

## 2019-09-23 MED ORDER — GLUCERNA SHAKE PO LIQD
237.0000 mL | Freq: Two times a day (BID) | ORAL | Status: DC
  Administered 2019-09-23: 237 mL via ORAL

## 2019-09-23 MED ORDER — POLYETHYLENE GLYCOL 3350 17 G PO PACK
17.0000 g | PACK | Freq: Two times a day (BID) | ORAL | Status: DC
  Administered 2019-09-23 – 2019-09-24 (×3): 17 g via ORAL
  Filled 2019-09-23 (×3): qty 1

## 2019-09-23 MED ORDER — TRAMADOL HCL 50 MG PO TABS
50.0000 mg | ORAL_TABLET | Freq: Four times a day (QID) | ORAL | Status: DC | PRN
  Administered 2019-09-23 – 2019-09-24 (×3): 50 mg via ORAL
  Filled 2019-09-23 (×4): qty 1

## 2019-09-23 NOTE — Progress Notes (Signed)
PROGRESS NOTE    [name]  [id number] DOB: 1930-11-16 DOA: 09/20/2019 PCP: Hoyt Koch, MD   Brief Narrative: 85 year old with past medical history significant for chronic diastolic heart failure, stage III chronic kidney disease, CAD, type 2 diabetes with neurological complications, morbid obesity and hypertension who was admitted with 2 days history of worsening confusion in the setting of recently being treated for UTI with Keflex.  Work-up in the ED show a BNP of 1277 a chest x-ray with significant cardiomegaly with vascular congestion.  She was placed on IV Lasix and IV Rocephin.   Assessment & Plan:   Principal Problem:   Acute on chronic diastolic CHF (congestive heart failure) (HCC) Active Problems:   Hypertension, essential   Mixed dyslipidemia   Uncontrolled diabetes mellitus (HCC)   CKD (chronic kidney disease) stage 3, GFR 30-59 ml/min   Diabetic polyneuropathy associated with type 2 diabetes mellitus (HCC)   Acute kidney injury superimposed on CKD (HCC)   Acute metabolic encephalopathy   Pressure injury of skin stage II to buttocks   Benign hypertension with CKD (chronic kidney disease) stage III   Obesity (BMI 30-39.9)   UTI due to Klebsiella species   Hypoglycemia due to insulin   1-Acute on chronic diastolic Heart failure exacerbation associated with hypoxemia.  Patient presented with hypoxemia with oxygen saturation 71 on room air which improved to 90% on 3 L.  Chest x-ray was consistent with CHF. Negative 1 L. Weight; 205----198. Renal function stable.  Oxygen sat 96 on 2 L, 85 on RA.  I will continue with IV Lasix for another 24 hours.   2-Acute hypoxic Respiratory failure; secondary to pulmonary edema secondary to acute CHF;  Improved, continue with IV Lasix today still requiring 2 L of oxygen.  3-Acute metabolic encephalopathy ; related to hypoxemia and infection. Patient is alert and conversant today.  Son at bedside think that she is  almost back to her baseline from her mentation.  4-Diabetic polyneuropathy associated with poorly controlled type 2 diabetes: Patient had couple of low blood sugar overnight. We will hold Lantus today.  Continue with a sliding scale insulin.  5-Poor oral intake: son report poor oral intake for the last several days.  Will order Glucerna.  6-Hypertension: Continue with Norvasc hydralazine, metoprolol.  7-Acute kidney injury in the setting of known chronic kidney disease a stage IIIb: Creatinine baseline around 1.4. Patient presented with a creatinine of 1.8 on admission. Improved on IV Lasix.  Continue to monitor daily.  8-Klebsiella UTI: Cultures from 09/18/2019 grew Klebsiella.  Sensitive to's Rocephin.  She was transitioned to Keflex. Will repeat UA, family request. White Count has normalized.  9-syncope/fall resulting in nondisplaced transverse type II fracture of odontoid process: Fall occurred on 09/18/2019.  CT of cervical spine with a nonacute nondisplaced transverse type II fracture of the odontoid process, and it appears she was evaluated in the ED at that time. Neurosurgery saw her on 09/20/2019 and reported that she is not a candidate for surgical intervention.  Continue with immobilization.  PT OT eval.  She is able to tolerate soft collar  Hyperlipidemia: Continue with atorvastatin Obesity BMI 30-39  Pressure injury buttock right a stage II, present on admission local care  Pressure Injury 09/20/19 Buttocks Right Stage II -  Partial thickness loss of dermis presenting as a shallow open ulcer with a red, pink wound bed without slough. (Active)  09/20/19 2300  Location: Buttocks  Location Orientation: Right  Staging: Stage II -  Partial thickness loss of dermis presenting as a shallow open ulcer with a red, pink wound bed without slough.  Wound Description (Comments):   Present on Admission: Yes      Estimated body mass index is 34.1 kg/m as calculated from the  following:   Height as of this encounter: 5\' 4"  (1.626 m).   Weight as of this encounter: 90.1 kg.   DVT prophylaxis: Heparin Code Status: DNR Family Communication: Care discussed with son who was at bedside Disposition Plan: We will need another PT eval to determine if patient is to require to go to SNF, continue with IV Lasix today still hypoxic. Consultants:   None  Procedures:   None  Antimicrobials:  Keflex  Subjective: Patient is alert and conversant today.  She still complaining of neck pain.  She is breathing better, not at baseline for her breathing. Objective: Vitals:   09/22/19 0829 09/22/19 1432 09/22/19 2029 09/23/19 0506  BP: 134/84 (!) 136/56 (!) 144/49 (!) 151/53  Pulse:   62 (!) 53  Resp:   19 19  Temp:  97.8 F (36.6 C) 98.6 F (37 C) 97.8 F (36.6 C)  TempSrc:  Oral Oral Oral  SpO2:   95% 96%  Weight:    90.1 kg  Height:        Intake/Output Summary (Last 24 hours) at 09/23/2019 [id number] Last data filed at 09/23/2019 0500 Gross per 24 hour  Intake 360 ml  Output 500 ml  Net -140 ml   Filed Weights   09/21/19 0524 09/22/19 0315 09/23/19 0506  Weight: 93 kg 89.9 kg 90.1 kg    Examination:  General exam: Appears calm and comfortable , soft collar in place Respiratory system: Lateral crackles especially on the left side respiratory effort normal. Cardiovascular system: S1 & S2 heard, RRR. No JVD, murmurs, rubs, gallops or clicks. No pedal edema. Gastrointestinal system: Abdomen is nondistended, soft and nontender. No organomegaly or masses felt. Normal bowel sounds heard. Central nervous system: Alert and oriented.  Extremities: Symmetric 5 x 5 power.  There is edema     Data Reviewed: I have personally reviewed following labs and imaging studies  CBC: Recent Labs  Lab 09/18/19 1708 09/20/19 1347  WBC 11.8* 13.4*  NEUTROABS 9.8* 11.5*  HGB 13.3 12.1  HCT 45.0 39.3  MCV 91.8 89.1  PLT 85* [id number]   Basic Metabolic Panel: Recent Labs   Lab 09/18/19 1708 09/20/19 1347 09/21/19 0316 09/22/19 0417 09/23/19 0400  NA 136 133* 135 138 138  K 5.3* 5.6* 4.8 4.3 4.7  CL 101 92* 96* 98 94*  CO2 27 24 29 30  32  GLUCOSE 135* 303* 126* 41* 86  BUN 43* 51* 51* 45* 36*  CREATININE 1.81* 1.80* 1.78* 1.44* 1.22*  CALCIUM 8.7* 8.8* 8.7* 8.7* 8.9   GFR: Estimated Creatinine Clearance: 36 mL/min (A) (by C-G formula based on SCr of 1.22 mg/dL (H)). Liver Function Tests: Recent Labs  Lab 09/20/19 1347  AST 20  ALT 17  ALKPHOS 74  BILITOT 0.6  PROT 6.3*  ALBUMIN 3.2*   No results for input(s): LIPASE, AMYLASE in the last 168 hours. No results for input(s): AMMONIA in the last 168 hours. Coagulation Profile: No results for input(s): INR, PROTIME in the last 168 hours. Cardiac Enzymes: No results for input(s): CKTOTAL, CKMB, CKMBINDEX, TROPONINI in the last 168 hours. BNP (last 3 results) No results for input(s): PROBNP in the last 8760 hours. HbA1C: No results for input(s): HGBA1C in the  last 72 hours. CBG: Recent Labs  Lab 09/22/19 1137 09/22/19 1640 09/22/19 1730 09/22/19 2115 09/23/19 0735  GLUCAP 81 69* 93 138* 79   Lipid Profile: No results for input(s): CHOL, HDL, LDLCALC, TRIG, CHOLHDL, LDLDIRECT in the last 72 hours. Thyroid Function Tests: No results for input(s): TSH, T4TOTAL, FREET4, T3FREE, THYROIDAB in the last 72 hours. Anemia Panel: No results for input(s): VITAMINB12, FOLATE, FERRITIN, TIBC, IRON, RETICCTPCT in the last 72 hours. Sepsis Labs: No results for input(s): PROCALCITON, LATICACIDVEN in the last 168 hours.  Recent Results (from the past 240 hour(s))  SARS CORONAVIRUS 2 (TAT 6-24 HRS) Nasopharyngeal Nasopharyngeal Swab     Status: None   Collection Time: 09/18/19  3:19 PM   Specimen: Nasopharyngeal Swab  Result Value Ref Range Status   SARS Coronavirus 2 NEGATIVE NEGATIVE Final    Comment: (NOTE) SARS-CoV-2 target nucleic acids are NOT DETECTED. The SARS-CoV-2 RNA is generally  detectable in upper and lower respiratory specimens during the acute phase of infection. Negative results do not preclude SARS-CoV-2 infection, do not rule out co-infections with other pathogens, and should not be used as the sole basis for treatment or other patient management decisions. Negative results must be combined with clinical observations, patient history, and epidemiological information. The expected result is Negative. Fact Sheet for Patients: SugarRoll.be Fact Sheet for Healthcare Providers: https://www.woods-mathews.com/ This test is not yet approved or cleared by the [place] FDA and  has been authorized for detection and/or diagnosis of SARS-CoV-2 by FDA under an Emergency Use Authorization (EUA). This EUA will remain  in effect (meaning this test can be used) for the duration of the COVID-19 declaration under Section 56 4(b)(1) of the Act, 21 U.S.C. section 360bbb-3(b)(1), unless the authorization is terminated or revoked sooner. Performed at [facility], [place] [address] [place], [place] [address]   Urine culture     Status: Abnormal   Collection Time: 09/18/19  8:51 PM   Specimen: Urine, Random  Result Value Ref Range Status   Specimen Description   Final    URINE, RANDOM Performed at [place] [address], [place], [place] [address]    Special Requests   Final    NONE Performed at [facility], [place] [address], [place], [place] [address]    Culture >=100,000 COLONIES/mL KLEBSIELLA PNEUMONIAE (A)  Final   Report Status 09/21/2019 FINAL  Final   Organism ID, Bacteria KLEBSIELLA PNEUMONIAE (A)  Final      Susceptibility   Klebsiella pneumoniae - MIC*    AMPICILLIN >=32 RESISTANT Resistant     CEFAZOLIN <=4 SENSITIVE Sensitive     CEFTRIAXONE <=1 SENSITIVE Sensitive     CIPROFLOXACIN <=0.25 SENSITIVE Sensitive     GENTAMICIN <=1 SENSITIVE Sensitive     IMIPENEM  <=0.25 SENSITIVE Sensitive     NITROFURANTOIN 64 INTERMEDIATE Intermediate     TRIMETH/SULFA <=20 SENSITIVE Sensitive     AMPICILLIN/SULBACTAM 4 SENSITIVE Sensitive     PIP/TAZO <=4 SENSITIVE Sensitive     Extended ESBL NEGATIVE Sensitive     * >=100,000 COLONIES/mL KLEBSIELLA PNEUMONIAE  SARS CORONAVIRUS 2 (TAT 6-24 HRS) Nasopharyngeal Nasopharyngeal Swab     Status: None   Collection Time: 09/20/19  2:03 PM   Specimen: Nasopharyngeal Swab  Result Value Ref Range Status   SARS Coronavirus 2 NEGATIVE NEGATIVE Final    Comment: (NOTE) SARS-CoV-2 target nucleic acids are NOT DETECTED. The SARS-CoV-2 RNA is generally detectable in upper and lower respiratory  specimens during the acute phase of infection. Negative results do not preclude SARS-CoV-2 infection, do not rule out co-infections with other pathogens, and should not be used as the sole basis for treatment or other patient management decisions. Negative results must be combined with clinical observations, patient history, and epidemiological information. The expected result is Negative. Fact Sheet for Patients: SugarRoll.be Fact Sheet for Healthcare Providers: https://www.woods-mathews.com/ This test is not yet approved or cleared by the [place] FDA and  has been authorized for detection and/or diagnosis of SARS-CoV-2 by FDA under an Emergency Use Authorization (EUA). This EUA will remain  in effect (meaning this test can be used) for the duration of the COVID-19 declaration under Section 56 4(b)(1) of the Act, 21 U.S.C. section 360bbb-3(b)(1), unless the authorization is terminated or revoked sooner. Performed at [facility], [place] [address], [place], [place] [address]   Blood culture (routine x 2)     Status: None (Preliminary result)   Collection Time: 09/20/19  2:50 PM   Specimen: BLOOD  Result Value Ref Range Status   Specimen Description BLOOD RIGHT ANTECUBITAL   Final   Special Requests   Final    BOTTLES DRAWN AEROBIC AND ANAEROBIC Blood Culture adequate volume   Culture   Final    NO GROWTH 2 DAYS Performed at [facility], [place] [address], [place], [place] [address]    Report Status PENDING  Incomplete  Blood culture (routine x 2)     Status: None (Preliminary result)   Collection Time: 09/20/19  3:00 PM   Specimen: BLOOD  Result Value Ref Range Status   Specimen Description BLOOD RIGHT ANTECUBITAL  Final   Special Requests   Final    BOTTLES DRAWN AEROBIC AND ANAEROBIC Blood Culture adequate volume   Culture   Final    NO GROWTH 2 DAYS Performed at [facility], [place] [address] [place], [place] [address]    Report Status PENDING  Incomplete  SARS CORONAVIRUS 2 (TAT 6-24 HRS) Nasopharyngeal Nasopharyngeal Swab     Status: None   Collection Time: 09/22/19  4:41 PM   Specimen: Nasopharyngeal Swab  Result Value Ref Range Status   SARS Coronavirus 2 NEGATIVE NEGATIVE Final    Comment: (NOTE) SARS-CoV-2 target nucleic acids are NOT DETECTED. The SARS-CoV-2 RNA is generally detectable in upper and lower respiratory specimens during the acute phase of infection. Negative results do not preclude SARS-CoV-2 infection, do not rule out co-infections with other pathogens, and should not be used as the sole basis for treatment or other patient management decisions. Negative results must be combined with clinical observations, patient history, and epidemiological information. The expected result is Negative. Fact Sheet for Patients: SugarRoll.be Fact Sheet for Healthcare Providers: https://www.woods-mathews.com/ This test is not yet approved or cleared by the [place] FDA and  has been authorized for detection and/or diagnosis of SARS-CoV-2 by FDA under an Emergency Use Authorization (EUA). This EUA will remain  in effect (meaning this test can be used) for the duration of the COVID-19  declaration under Section 56 4(b)(1) of the Act, 21 U.S.C. section 360bbb-3(b)(1), unless the authorization is terminated or revoked sooner. Performed at [facility], [place] [address] [place], [place] [address]          Radiology Studies: No results found.      Scheduled Meds: . amLODipine  5 mg Oral Daily  . aspirin EC  81 mg Oral q morning - 10a  .  atorvastatin  40 mg Oral q morning - 10a  . cephALEXin  250 mg Oral Q12H  . furosemide  80 mg Intravenous Daily  . gabapentin  300 mg Oral QHS  . heparin  5,000 Units Subcutaneous Q8H  . hydrALAZINE  25 mg Oral BID  . insulin aspart  0-9 Units Subcutaneous TID WC  . metoprolol tartrate  25 mg Oral BID  . miconazole  1 Applicatorful Vaginal QHS  . pneumococcal 23 valent vaccine  0.5 mL Intramuscular Tomorrow-1000  . sodium chloride flush  3 mL Intravenous Q12H   Continuous Infusions: . sodium chloride       LOS: 3 days    Time spent: 35 minutes.     Elmarie Shiley, MD [facility]   If 7PM-7AM, please contact night-coverage [email or web address] Password [facility] 09/23/2019, 8:32 AM

## 2019-09-23 NOTE — Evaluation (Signed)
Physical Therapy Evaluation Patient Details Name: [name] MRN: [id number] DOB: 20-Sep-1931 Today's Date: 09/23/2019   History of Present Illness  85 y.o. female with medical history significant of CHF, CKD stage III, CAD, diabetes, morbid obesity admitted with fall, confusion, UTI. Imaging showed old type II odontoid fracture, neuro recommended cervical collar.  Clinical Impression  Pt was seen by PT for a regular visit but son was there to ask many questions and to give some history to PT and OT earlier.  He is very anxious for pt to be in SNF but this is mainly worry about seeing her.  Discussed the options for this, and will expect her to go to rehab as needed but with him overseeing her care from [place].  Follow acutely to hopefully reduce her need to stay in rehab setting before returning home.    Follow Up Recommendations SNF    Equipment Recommendations  None recommended by PT    Recommendations for Other Services       Precautions / Restrictions Precautions Precautions: Cervical;Fall Precaution Booklet Issued: No Required Braces or Orthoses: Cervical Brace Cervical Brace: Hard collar;Other (comment)(ok to be off while in bed, to walk to bathroom, and to showe) Restrictions Weight Bearing Restrictions: No      Mobility  Bed Mobility Overal bed mobility: Needs Assistance Bed Mobility: Rolling;Sidelying to Sit Rolling: Mod assist;+2 for physical assistance Sidelying to sit: Mod assist;+2 for physical assistance       General bed mobility comments: assistance to initiate roll and to raise trunk  Transfers Overall transfer level: Needs assistance Equipment used: Rolling walker (2 wheeled) Transfers: Sit to/from Stand Sit to Stand: Min assist;+2 safety/equipment         General transfer comment: assist to rise  Ambulation/Gait Ambulation/Gait assistance: Mod assist Gait Distance (Feet): 5 Feet Assistive device: Rolling walker (2 wheeled) Gait  Pattern/deviations: Step-through pattern;Decreased stride length Gait velocity: reduced Gait velocity interpretation: <1.31 ft/sec, indicative of household ambulator General Gait Details: walked from [facility] to chair  Stairs            Wheelchair Mobility    Modified Rankin (Stroke Patients Only)       Balance Overall balance assessment: Needs assistance   Sitting balance-Leahy Scale: Fair     Standing balance support: Bilateral upper extremity supported Standing balance-Leahy Scale: Poor                               Pertinent Vitals/Pain Pain Assessment: Faces Faces Pain Scale: Hurts even more Pain Location: posterior neck Pain Descriptors / Indicators: Guarding;Grimacing Pain Intervention(s): Heat applied;Repositioned;Premedicated before session;Monitored during session;Limited activity within patient's tolerance    Home Living Family/patient expects to be discharged to:: Private residence Living Arrangements: Children Available Help at Discharge: Family Type of Home: House Home Access: Stairs to enter Entrance Stairs-Rails: Right;Left;Can reach both Entrance Stairs-Number of Steps: 3 Home Layout: Two level;Able to live on main level with bedroom/bathroom Home Equipment: Shower seat;Walker - 2 wheels Additional Comments: son is there to give history    Prior Function Level of Independence: Independent with assistive device(s)         Comments: walks with RW, lives with son who is home 24*     Hand Dominance        Extremity/Trunk Assessment   Upper Extremity Assessment Upper Extremity Assessment: Generalized weakness    Lower Extremity Assessment Lower Extremity Assessment: Generalized weakness  Cervical / Trunk Assessment Cervical / Trunk Assessment: Other exceptions Cervical / Trunk Exceptions: nonacute type II odontoid fracture, trunk flexed posture  Communication   Communication: No difficulties  Cognition  Arousal/Alertness: Awake/alert Behavior During Therapy: WFL for tasks assessed/performed Overall Cognitive Status: Within Functional Limits for tasks assessed                                        General Comments General comments (skin integrity, edema, etc.): sats were soen to 83% room air so returned to cannulla    Exercises General Exercises - Lower Extremity Ankle Circles/Pumps: AAROM;5 reps Hip ABduction/ADduction: AAROM;10 reps   Assessment/Plan    PT Assessment Patient needs continued PT services  PT Problem List Decreased strength;Decreased activity tolerance;Decreased mobility;Pain       PT Treatment Interventions Therapeutic exercise;Therapeutic activities;Patient/family education;Gait training;Functional mobility training;Balance training;Neuromuscular re-education    PT Goals (Current goals can be found in the Care Plan section)  Acute Rehab PT Goals Patient Stated Goal: regain strength    Frequency Min 2X/week   Barriers to discharge        Co-evaluation               AM-PAC PT "6 Clicks" Mobility  Outcome Measure Help needed turning from your back to your side while in a flat bed without using bedrails?: A Lot Help needed moving from lying on your back to sitting on the side of a flat bed without using bedrails?: A Lot Help needed moving to and from a bed to a chair (including a wheelchair)?: A Lot Help needed standing up from a chair using your arms (e.g., wheelchair or bedside chair)?: A Lot Help needed to walk in hospital room?: A Lot Help needed climbing 3-5 steps with a railing? : A Lot 6 Click Score: 12    End of Session Equipment Utilized During Treatment: Gait belt;Oxygen Activity Tolerance: Patient limited by fatigue;Patient limited by pain Patient left: in chair;with call bell/phone within reach;with chair alarm set;with family/visitor present Nurse Communication: Mobility status PT Visit Diagnosis: Difficulty in  walking, not elsewhere classified (R26.2);Pain;History of falling (Z91.81);Muscle weakness (generalized) (M62.81) Pain - part of body: (spine, neck)    Time: [id number] PT Time Calculation (min) (ACUTE ONLY): 47 min   Charges:     PT Treatments $Gait Training: 8-22 mins $Therapeutic Exercise: 8-22 mins $Therapeutic Activity: 8-22 mins       Ramond Dial 09/23/2019, 6:23 PM   Mee Hives, PT MS Acute Rehab Dept. Number: [place] and [place]

## 2019-09-24 DIAGNOSIS — N179 Acute kidney failure, unspecified: Secondary | ICD-10-CM | POA: Diagnosis not present

## 2019-09-24 DIAGNOSIS — I5032 Chronic diastolic (congestive) heart failure: Secondary | ICD-10-CM | POA: Diagnosis not present

## 2019-09-24 DIAGNOSIS — E785 Hyperlipidemia, unspecified: Secondary | ICD-10-CM | POA: Diagnosis not present

## 2019-09-24 DIAGNOSIS — R262 Difficulty in walking, not elsewhere classified: Secondary | ICD-10-CM | POA: Diagnosis not present

## 2019-09-24 DIAGNOSIS — R279 Unspecified lack of coordination: Secondary | ICD-10-CM | POA: Diagnosis not present

## 2019-09-24 DIAGNOSIS — Z23 Encounter for immunization: Secondary | ICD-10-CM | POA: Diagnosis not present

## 2019-09-24 DIAGNOSIS — E1149 Type 2 diabetes mellitus with other diabetic neurological complication: Secondary | ICD-10-CM | POA: Diagnosis not present

## 2019-09-24 DIAGNOSIS — I5033 Acute on chronic diastolic (congestive) heart failure: Secondary | ICD-10-CM | POA: Diagnosis not present

## 2019-09-24 DIAGNOSIS — R0902 Hypoxemia: Secondary | ICD-10-CM | POA: Diagnosis not present

## 2019-09-24 DIAGNOSIS — J9621 Acute and chronic respiratory failure with hypoxia: Secondary | ICD-10-CM | POA: Diagnosis not present

## 2019-09-24 DIAGNOSIS — N390 Urinary tract infection, site not specified: Secondary | ICD-10-CM | POA: Diagnosis not present

## 2019-09-24 DIAGNOSIS — J9601 Acute respiratory failure with hypoxia: Secondary | ICD-10-CM | POA: Diagnosis not present

## 2019-09-24 DIAGNOSIS — I129 Hypertensive chronic kidney disease with stage 1 through stage 4 chronic kidney disease, or unspecified chronic kidney disease: Secondary | ICD-10-CM | POA: Diagnosis not present

## 2019-09-24 DIAGNOSIS — E160 Drug-induced hypoglycemia without coma: Secondary | ICD-10-CM | POA: Diagnosis not present

## 2019-09-24 DIAGNOSIS — W19XXXA Unspecified fall, initial encounter: Secondary | ICD-10-CM | POA: Diagnosis not present

## 2019-09-24 DIAGNOSIS — Z743 Need for continuous supervision: Secondary | ICD-10-CM | POA: Diagnosis not present

## 2019-09-24 DIAGNOSIS — R55 Syncope and collapse: Secondary | ICD-10-CM | POA: Diagnosis not present

## 2019-09-24 DIAGNOSIS — E1142 Type 2 diabetes mellitus with diabetic polyneuropathy: Secondary | ICD-10-CM | POA: Diagnosis not present

## 2019-09-24 DIAGNOSIS — N183 Chronic kidney disease, stage 3 unspecified: Secondary | ICD-10-CM | POA: Diagnosis not present

## 2019-09-24 DIAGNOSIS — G9341 Metabolic encephalopathy: Secondary | ICD-10-CM | POA: Diagnosis not present

## 2019-09-24 DIAGNOSIS — B961 Klebsiella pneumoniae [K. pneumoniae] as the cause of diseases classified elsewhere: Secondary | ICD-10-CM | POA: Diagnosis not present

## 2019-09-24 DIAGNOSIS — I959 Hypotension, unspecified: Secondary | ICD-10-CM | POA: Diagnosis not present

## 2019-09-24 DIAGNOSIS — S12112D Nondisplaced Type II dens fracture, subsequent encounter for fracture with routine healing: Secondary | ICD-10-CM | POA: Diagnosis not present

## 2019-09-24 DIAGNOSIS — R402411 Glasgow coma scale score 13-15, in the field [EMT or ambulance]: Secondary | ICD-10-CM | POA: Diagnosis not present

## 2019-09-24 LAB — GLUCOSE, CAPILLARY
Glucose-Capillary: 95 mg/dL (ref 70–99)
Glucose-Capillary: 156 mg/dL — ABNORMAL HIGH (ref 70–99)

## 2019-09-24 LAB — CBC
HCT: 36.6 % (ref 36.0–46.0)
Hemoglobin: 11.5 g/dL — ABNORMAL LOW (ref 12.0–15.0)
MCH: 27.7 pg (ref 26.0–34.0)
MCHC: 31.4 g/dL (ref 30.0–36.0)
MCV: 88.2 fL (ref 80.0–100.0)
Platelets: 221 10*3/uL (ref 150–400)
RBC: 4.15 MIL/uL (ref 3.87–5.11)
RDW: 14 % (ref 11.5–15.5)
WBC: 8.6 10*3/uL (ref 4.0–10.5)
nRBC: 0 % (ref 0.0–0.2)

## 2019-09-24 LAB — BASIC METABOLIC PANEL
Anion gap: 11 (ref 5–15)
BUN: 34 mg/dL — ABNORMAL HIGH (ref 8–23)
CO2: 34 mmol/L — ABNORMAL HIGH (ref 22–32)
Calcium: 8.9 mg/dL (ref 8.9–10.3)
Chloride: 92 mmol/L — ABNORMAL LOW (ref 98–111)
Creatinine, Ser: 1.18 mg/dL — ABNORMAL HIGH (ref 0.44–1.00)
GFR calc Af Amer: 48 mL/min — ABNORMAL LOW (ref >60)
GFR calc non Af Amer: 42 mL/min — ABNORMAL LOW (ref >60)
Glucose, Bld: 115 mg/dL — ABNORMAL HIGH (ref 70–99)
Potassium: 4.6 mmol/L (ref 3.5–5.1)
Sodium: 137 mmol/L (ref 135–145)

## 2019-09-24 MED ORDER — POLYETHYLENE GLYCOL 3350 17 G PO PACK: 17.0000 g | PACK | Freq: Two times a day (BID) | ORAL | 0 refills | Status: DC

## 2019-09-24 MED ORDER — ONDANSETRON HCL 4 MG PO TABS
4.0000 mg | ORAL_TABLET | Freq: Four times a day (QID) | ORAL | Status: DC | PRN
  Filled 2019-09-24: qty 1

## 2019-09-24 MED ORDER — FUROSEMIDE 80 MG PO TABS
80.0000 mg | ORAL_TABLET | Freq: Every day | ORAL | Status: DC
  Administered 2019-09-24: 80 mg via ORAL
  Filled 2019-09-24: qty 1

## 2019-09-24 MED ORDER — MICONAZOLE NITRATE 2 % VA CREA: 1.0000 | TOPICAL_CREAM | Freq: Every day | VAGINAL | 0 refills | Status: DC

## 2019-09-24 MED ORDER — CEPHALEXIN 250 MG PO CAPS: 250.0000 mg | ORAL_CAPSULE | Freq: Two times a day (BID) | ORAL | 0 refills | Status: DC

## 2019-09-24 MED ORDER — TRAMADOL HCL 50 MG PO TABS: 50.0000 mg | ORAL_TABLET | Freq: Four times a day (QID) | ORAL | 0 refills | Status: DC | PRN

## 2019-09-24 MED ORDER — GLUCERNA SHAKE PO LIQD: 237.0000 mL | Freq: Two times a day (BID) | ORAL | 0 refills | Status: DC

## 2019-09-24 NOTE — TOC Progression Note (Signed)
Transition of Care [facility]) - Progression Note    Patient Details  Name: [name] MRN: [id number] Date of Birth: 10/31/1931  [facility] CM/SW , LCSW Phone Number: 09/24/2019, 11:27 AM  Clinical Narrative:    [facility] able to accept patient today. CSW awaiting call back from patient's son prior to discharge.    Expected Discharge Plan: [place] Barriers to Discharge: No Barriers Identified  Expected Discharge Plan and Services Expected Discharge Plan: [place] In-house Referral: NA Discharge Planning Services: CM Consult Post Acute Care Choice: [place] Living arrangements for the past 2 months: Single Family Home Expected Discharge Date: 09/24/19                                     Social Determinants of Health (SDOH) Interventions    Readmission Risk Interventions No flowsheet data found.

## 2019-09-24 NOTE — Discharge Summary (Signed)
Physician Discharge Summary  [name] [id number] DOB: 26-Feb-1931 DOA: 09/20/2019  PCP: Hoyt Koch, MD  Admit date: 09/20/2019 Discharge date: 09/24/2019  Admitted From: Home  Disposition: SNF  Recommendations for Outpatient Follow-up:  1. Follow up with PCP in 1-2 weeks 2. Please obtain BMP/CBC in one week 3. Please follow up on the following pending results: follow final report of blood culture.  4. Monitor CBG, if CBG start to increase consider resuming low dose lantus.  5. Monitor renal function, wight and volume status.  6. Continue to work with PT.    Discharge Condition: Stable.  CODE STATUS: DNR Diet recommendation: Heart Healthy / Carb Modified  Brief/Interim Summary: 85 year old with past medical history significant for chronic diastolic heart failure, stage III chronic kidney disease, CAD, type 2 diabetes with neurological complications, morbid obesity and hypertension who was admitted with 2 days history of worsening confusion in the setting of recently being treated for UTI with Keflex.  Work-up in the ED show a BNP of 1277 a chest x-ray with significant cardiomegaly with vascular congestion.  She was placed on IV Lasix and IV Rocephin. Patient respond to IV lasix, her weight has decrease to 195 from 205. Her renal function improved. Her confusion has almost resolved.     1-Acute on chronic diastolic Heart failure exacerbation associated with hypoxemia.  Patient presented with hypoxemia with oxygen saturation 71 on room air which improved to 90% on 3 L.  Chest x-ray was consistent with CHF. Negative 1 L. Weight; 205----198--195 Renal function remain stable with cr at 1.1.  Oxygen sat 97 on 2 L She will be transition to oral lasix today.  Stable for discharge.    2-Acute hypoxic Respiratory failure; secondary to pulmonary edema secondary to acute CHF;  Improved, continue with IV Lasix. She will be transition to oral lasix.  She will be need to  be wean of oxygen over time.   3-Acute metabolic encephalopathy ; related to hypoxemia and infection. Patient is alert and conversant. Improved, almost at baseline.   4-Diabetic polyneuropathy associated with poorly controlled type 2 diabetes: Patient had couple of low blood sugar overnight during this hospitalization. Her glipizide has been on hold.  Her lantus was discontinue. She only received 2 units of insulin from SSI yesterday. Hold lantus at discharge. Monitor CBG and use SSI as indicated. Marland Kitchen   5-Poor oral intake: son report poor oral intake for the last several days.  Glucerna ordered.    6-Hypertension: Continue with Norvasc hydralazine, metoprolol.  7-Acute kidney injury in the setting of known chronic kidney disease a stage IIIb: Creatinine baseline around 1.4. Patient presented with a creatinine of 1.8 on admission. Renal function remain stable.   8-Klebsiella UTI: Cultures from 09/18/2019 grew Klebsiella.  Sensitive to's Rocephin.  She was transitioned to Keflex. Repeated UA on 11-20  with no leukocytosis  White Count has normalized. She will be discharge on 2 more days of klefex to complete 7 days.   9-Syncope/fall resulting in nondisplaced transverse type II fracture of odontoid process: Fall occurred on 09/18/2019.  CT of cervical spine with a nonacute nondisplaced transverse type II fracture of the odontoid process, and it appears she was evaluated in the ED at that time. Neurosurgery saw her on 09/20/2019 and reported that she is not a candidate for surgical intervention. PT OT eval.  She is able to tolerate soft collar  Hyperlipidemia: Continue with atorvastatin Obesity BMI 30-39  Pressure injury buttock right a stage II, present  on admission local care  Discharge Diagnoses:  Principal Problem:   Acute on chronic diastolic CHF (congestive heart failure) (HCC) Active Problems:   Hypertension, essential   Mixed dyslipidemia   Uncontrolled diabetes  mellitus (HCC)   CKD (chronic kidney disease) stage 3, GFR 30-59 ml/min   Diabetic polyneuropathy associated with type 2 diabetes mellitus (HCC)   Acute kidney injury superimposed on CKD (HCC)   Acute metabolic encephalopathy   Pressure injury of skin stage II to buttocks   Benign hypertension with CKD (chronic kidney disease) stage III   Obesity (BMI 30-39.9)   UTI due to Klebsiella species   Hypoglycemia due to insulin    Discharge Instructions  Discharge Instructions    Diet - low sodium heart healthy   Complete by: As directed    Increase activity slowly   Complete by: As directed      Allergies as of 09/24/2019      Reactions   Atenolol Other (See Comments)   weakness   Codeine Nausea And Vomiting   Hydrocodone is ok   Lactose Intolerance (gi) Diarrhea   Lodine [etodolac] Other (See Comments)   dizziness   Sulfa Drugs Cross Reactors Nausea Only   Sulfamethoxazole Nausea And Vomiting   Benzonatate Nausea And Vomiting   Escitalopram Oxalate Other (See Comments)   mild hallucinations   Metformin Nausea And Vomiting   Reaction to IR and XL   Oxycodone-acetaminophen Other (See Comments)   Percocet does not relieve pts' pain   Pregabalin Other (See Comments)   Unknown reaction      Medication List    STOP taking these medications   ALPRAZolam 0.25 MG tablet Commonly known as: XANAX   diphenhydramine-acetaminophen 25-500 MG Tabs tablet Commonly known as: TYLENOL PM   glipiZIDE 5 MG 24 hr tablet Commonly known as: GLUCOTROL XL   insulin glargine 100 UNIT/ML injection Commonly known as: LANTUS   oxyCODONE 5 MG immediate release tablet Commonly known as: Oxy IR/ROXICODONE     TAKE these medications   acetaminophen 500 MG tablet Commonly known as: TYLENOL Take 500 mg by mouth See admin instructions. Take one tablet (500 mg) by mouth twice daily, may also take one tablet (500 mg) mid-day as needed for pain/headache   amLODipine 5 MG tablet Commonly known  as: NORVASC TAKE ONE TABLET BY MOUTH DAILY   aspirin EC 81 MG tablet Take 81 mg by mouth every morning.   atorvastatin 40 MG tablet Commonly known as: LIPITOR Take 40 mg by mouth every morning.   cephALEXin 250 MG capsule Commonly known as: KEFLEX Take 1 capsule (250 mg total) by mouth every 12 (twelve) hours. What changed:   medication strength  how much to take  when to take this   ergocalciferol 1.25 MG (50000 UT) capsule Commonly known as: VITAMIN D2 Take 50,000 Units by mouth every Friday.   feeding supplement (GLUCERNA SHAKE) Liqd Take 237 mLs by mouth 2 (two) times daily between meals.   fluticasone 50 MCG/ACT nasal spray Commonly known as: FLONASE Place 2 sprays into both nostrils daily. What changed:   when to take this  reasons to take this   furosemide 80 MG tablet Commonly known as: LASIX TAKE 1 TABLET BY MOUTH EVERY DAY What changed: when to take this   gabapentin 300 MG capsule Commonly known as: NEURONTIN 2 TABLETS BY MOUTH AT BEDTIME What changed:   how much to take  how to take this  when to take this  additional  instructions   hydrALAZINE 25 MG tablet Commonly known as: APRESOLINE TAKE 1 TABLET BY MOUTH TWICE A DAY   insulin aspart 100 UNIT/ML injection Commonly known as: novoLOG Inject 3-5 Units into the skin 3 (three) times daily with meals. Based on a sliding scale.   LACTASE PO Take 1 tablet by mouth 3 (three) times daily as needed (if meal includes cheese or other dairy).   metoprolol tartrate 25 MG tablet Commonly known as: LOPRESSOR Take 25 mg by mouth 2 (two) times daily.   miconazole 2 % vaginal cream Commonly known as: MONISTAT 7 Place 1 Applicatorful vaginally at bedtime.   multivitamin with minerals Tabs tablet Take 1 tablet by mouth daily.   ONE TOUCH ULTRA TEST test strip Generic drug: glucose blood Use as directed   polyethylene glycol 17 g packet Commonly known as: MIRALAX / GLYCOLAX Take 17 g by  mouth 2 (two) times daily.   Systane 0.4-0.3 % Soln Generic drug: Polyethyl Glycol-Propyl Glycol Apply 2 drops to eye daily as needed (dry eyes).   traMADol 50 MG tablet Commonly known as: ULTRAM Take 1 tablet (50 mg total) by mouth every 6 (six) hours as needed for moderate pain. What changed:   when to take this  reasons to take this      Contact information for after-discharge care    Destination    [facility] .   Service: Skilled Nursing Contact information: [place] [address] [phone]             Allergies  Allergen Reactions  . Atenolol Other (See Comments)    weakness  . Codeine Nausea And Vomiting    Hydrocodone is ok  . Lactose Intolerance (Gi) Diarrhea  . Lodine [Etodolac] Other (See Comments)    dizziness  . Sulfa Drugs Cross Reactors Nausea Only  . Sulfamethoxazole Nausea And Vomiting  . Benzonatate Nausea And Vomiting  . Escitalopram Oxalate Other (See Comments)    mild hallucinations  . Metformin Nausea And Vomiting    Reaction to IR and XL  . Oxycodone-Acetaminophen Other (See Comments)    Percocet does not relieve pts' pain  . Pregabalin Other (See Comments)    Unknown reaction    Consultations:  None   Procedures/Studies: Ct Head Wo Contrast  Result Date: 09/20/2019 CLINICAL DATA:  Altered level of consciousness EXAM: CT HEAD WITHOUT CONTRAST TECHNIQUE: Contiguous axial images were obtained from the base of the skull through the vertex without intravenous contrast. COMPARISON:  09/18/2019 FINDINGS: Brain: Mild chronic microvascular disease throughout the deep white matter. No acute intracranial abnormality. Specifically, no hemorrhage, hydrocephalus, mass lesion, acute infarction, or significant intracranial injury. Vascular: No hyperdense vessel or unexpected calcification. Skull: No acute calvarial abnormality. Sinuses/Orbits: Visualized paranasal sinuses and mastoids clear.  Orbital soft tissues unremarkable. Other: None IMPRESSION: Chronic small vessel disease.  No acute intracranial abnormality. Electronically Signed   By: Rolm Baptise M.D.   On: 09/20/2019 20:18   Ct Head Wo Contrast  Result Date: 09/18/2019 CLINICAL DATA:  Fall, headache EXAM: CT HEAD WITHOUT CONTRAST CT CERVICAL SPINE WITHOUT CONTRAST TECHNIQUE: Multidetector CT imaging of the head and cervical spine was performed following the standard protocol without intravenous contrast. Multiplanar CT image reconstructions of the cervical spine were also generated. COMPARISON:  None. FINDINGS: CT HEAD FINDINGS Brain: No evidence of acute infarction, hemorrhage, hydrocephalus, extra-axial collection or mass lesion/mass effect. Periventricular white matter hypodensity. Vascular: No hyperdense vessel or unexpected calcification. Skull: Normal.  Negative for fracture or focal lesion. Sinuses/Orbits: No acute finding. Other: None. CT CERVICAL SPINE FINDINGS Alignment: Degenerative straightening and reversal of the normal cervical lordosis. Skull base and vertebrae: No acute fracture. There is a nonacute, nondisplaced transverse type II fracture of the odontoid process. No primary bone lesion or focal pathologic process. Soft tissues and spinal canal: No prevertebral fluid or swelling. No visible canal hematoma. Disc levels: Moderate to severe multilevel disc space height loss and osteophytosis. Upper chest: Negative. Other: None. IMPRESSION: 1. No acute intracranial pathology. Small-vessel white matter disease. 2.  No acute fracture or static subluxation of the cervical spine. 3. There is a nonacute, nondisplaced transverse type II fracture of the odontoid process. 4. Moderate to severe multilevel disc space height loss and osteophytosis. Electronically Signed   By: Eddie Candle M.D.   On: 09/18/2019 16:44   Ct Cervical Spine Wo Contrast  Result Date: 09/18/2019 CLINICAL DATA:  Fall, headache EXAM: CT HEAD WITHOUT CONTRAST  CT CERVICAL SPINE WITHOUT CONTRAST TECHNIQUE: Multidetector CT imaging of the head and cervical spine was performed following the standard protocol without intravenous contrast. Multiplanar CT image reconstructions of the cervical spine were also generated. COMPARISON:  None. FINDINGS: CT HEAD FINDINGS Brain: No evidence of acute infarction, hemorrhage, hydrocephalus, extra-axial collection or mass lesion/mass effect. Periventricular white matter hypodensity. Vascular: No hyperdense vessel or unexpected calcification. Skull: Normal. Negative for fracture or focal lesion. Sinuses/Orbits: No acute finding. Other: None. CT CERVICAL SPINE FINDINGS Alignment: Degenerative straightening and reversal of the normal cervical lordosis. Skull base and vertebrae: No acute fracture. There is a nonacute, nondisplaced transverse type II fracture of the odontoid process. No primary bone lesion or focal pathologic process. Soft tissues and spinal canal: No prevertebral fluid or swelling. No visible canal hematoma. Disc levels: Moderate to severe multilevel disc space height loss and osteophytosis. Upper chest: Negative. Other: None. IMPRESSION: 1. No acute intracranial pathology. Small-vessel white matter disease. 2.  No acute fracture or static subluxation of the cervical spine. 3. There is a nonacute, nondisplaced transverse type II fracture of the odontoid process. 4. Moderate to severe multilevel disc space height loss and osteophytosis. Electronically Signed   By: Eddie Candle M.D.   On: 09/18/2019 16:44   Dg Chest Portable 1 View  Result Date: 09/20/2019 CLINICAL DATA:  85 year old female with shortness of breath. EXAM: PORTABLE CHEST 1 VIEW COMPARISON:  Chest radiograph dated 09/18/2019. FINDINGS: Mild cardiomegaly with mild vascular congestion. No focal consolidation or pneumothorax. No significant pleural effusion. Atherosclerotic calcification of the aorta. No acute osseous pathology. IMPRESSION: Mild cardiomegaly  with mild vascular congestion. No focal consolidation. Electronically Signed   By: Anner Crete M.D.   On: 09/20/2019 14:04   Dg Chest Portable 1 View  Result Date: 09/18/2019 CLINICAL DATA:  Hypoxemia. Fell today. EXAM: PORTABLE CHEST 1 VIEW COMPARISON:  10/09/2016 FINDINGS: Stable enlarged cardiac silhouette. Clear lungs with normal vascularity. Thoracic spine degenerative changes. Stable old, healed left rib fractures. No acute fracture or pneumothorax seen. IMPRESSION: No acute abnormality. Stable cardiomegaly. Electronically Signed   By: Claudie Revering M.D.   On: 09/18/2019 16:42     Subjective: Denies worsening dyspnea. She was able to sleep last night.  She was asking for orange juice. She was able to be evaluated by PT yesterday. She sat on recliner.    Discharge Exam: Vitals:   09/24/19 0522 09/24/19 0829  BP: (!) 150/56 (!) 137/106  Pulse: 62 62  Resp: 19   Temp: 98.8 F (  37.1 C)   SpO2: 96%      General: Pt is alert, awake, not in acute distress Cardiovascular: RRR, S1/S2 +, no rubs, no gallops Respiratory: CTA bilaterally, no wheezing, no rhonchi Abdominal: Soft, NT, ND, bowel sounds + Extremities: no edema, no cyanosis    The results of significant diagnostics from this hospitalization (including imaging, microbiology, ancillary and laboratory) are listed below for reference.     Microbiology: Recent Results (from the past 240 hour(s))  SARS CORONAVIRUS 2 (TAT 6-24 HRS) Nasopharyngeal Nasopharyngeal Swab     Status: None   Collection Time: 09/18/19  3:19 PM   Specimen: Nasopharyngeal Swab  Result Value Ref Range Status   SARS Coronavirus 2 NEGATIVE NEGATIVE Final    Comment: (NOTE) SARS-CoV-2 target nucleic acids are NOT DETECTED. The SARS-CoV-2 RNA is generally detectable in upper and lower respiratory specimens during the acute phase of infection. Negative results do not preclude SARS-CoV-2 infection, do not rule out co-infections with other  pathogens, and should not be used as the sole basis for treatment or other patient management decisions. Negative results must be combined with clinical observations, patient history, and epidemiological information. The expected result is Negative. Fact Sheet for Patients: SugarRoll.be Fact Sheet for Healthcare Providers: https://www.woods-mathews.com/ This test is not yet approved or cleared by the [place] FDA and  has been authorized for detection and/or diagnosis of SARS-CoV-2 by FDA under an Emergency Use Authorization (EUA). This EUA will remain  in effect (meaning this test can be used) for the duration of the COVID-19 declaration under Section 56 4(b)(1) of the Act, 21 U.S.C. section 360bbb-3(b)(1), unless the authorization is terminated or revoked sooner. Performed at [facility], [place] [address], [place], [place] [address]   Urine culture     Status: Abnormal   Collection Time: 09/18/19  8:51 PM   Specimen: Urine, Random  Result Value Ref Range Status   Specimen Description   Final    URINE, RANDOM Performed at [facility] [address] [place], [place] [address]    Special Requests   Final    NONE Performed at [facility], [place] [address] [place], [place] [address]    Culture >=100,000 COLONIES/mL KLEBSIELLA PNEUMONIAE (A)  Final   Report Status 09/21/2019 FINAL  Final   Organism ID, Bacteria KLEBSIELLA PNEUMONIAE (A)  Final      Susceptibility   Klebsiella pneumoniae - MIC*    AMPICILLIN >=32 RESISTANT Resistant     CEFAZOLIN <=4 SENSITIVE Sensitive     CEFTRIAXONE <=1 SENSITIVE Sensitive     CIPROFLOXACIN <=0.25 SENSITIVE Sensitive     GENTAMICIN <=1 SENSITIVE Sensitive     IMIPENEM <=0.25 SENSITIVE Sensitive     NITROFURANTOIN 64 INTERMEDIATE Intermediate     TRIMETH/SULFA <=20 SENSITIVE Sensitive     AMPICILLIN/SULBACTAM 4 SENSITIVE Sensitive     PIP/TAZO <=4  SENSITIVE Sensitive     Extended ESBL NEGATIVE Sensitive     * >=100,000 COLONIES/mL KLEBSIELLA PNEUMONIAE  SARS CORONAVIRUS 2 (TAT 6-24 HRS) Nasopharyngeal Nasopharyngeal Swab     Status: None   Collection Time: 09/20/19  2:03 PM   Specimen: Nasopharyngeal Swab  Result Value Ref Range Status   SARS Coronavirus 2 NEGATIVE NEGATIVE Final    Comment: (NOTE) SARS-CoV-2 target nucleic acids are NOT DETECTED. The SARS-CoV-2 RNA is generally detectable in upper and lower respiratory specimens during the acute phase of infection. Negative results do not preclude SARS-CoV-2 infection, do not rule out  co-infections with other pathogens, and should not be used as the sole basis for treatment or other patient management decisions. Negative results must be combined with clinical observations, patient history, and epidemiological information. The expected result is Negative. Fact Sheet for Patients: SugarRoll.be Fact Sheet for Healthcare Providers: https://www.woods-mathews.com/ This test is not yet approved or cleared by the [place] FDA and  has been authorized for detection and/or diagnosis of SARS-CoV-2 by FDA under an Emergency Use Authorization (EUA). This EUA will remain  in effect (meaning this test can be used) for the duration of the COVID-19 declaration under Section 56 4(b)(1) of the Act, 21 U.S.C. section 360bbb-3(b)(1), unless the authorization is terminated or revoked sooner. Performed at [facility], [place] [address] [place], [place] [address]   Blood culture (routine x 2)     Status: None (Preliminary result)   Collection Time: 09/20/19  2:50 PM   Specimen: BLOOD  Result Value Ref Range Status   Specimen Description BLOOD RIGHT ANTECUBITAL  Final   Special Requests   Final    BOTTLES DRAWN AEROBIC AND ANAEROBIC Blood Culture adequate volume   Culture   Final    NO GROWTH 3 DAYS Performed at [facility], [place] [address], [place], [place] [address]    Report Status PENDING  Incomplete  Blood culture (routine x 2)     Status: None (Preliminary result)   Collection Time: 09/20/19  3:00 PM   Specimen: BLOOD  Result Value Ref Range Status   Specimen Description BLOOD RIGHT ANTECUBITAL  Final   Special Requests   Final    BOTTLES DRAWN AEROBIC AND ANAEROBIC Blood Culture adequate volume   Culture   Final    NO GROWTH 3 DAYS Performed at [facility], [place] [address], [place], [place] [address]    Report Status PENDING  Incomplete  SARS CORONAVIRUS 2 (TAT 6-24 HRS) Nasopharyngeal Nasopharyngeal Swab     Status: None   Collection Time: 09/22/19  4:41 PM   Specimen: Nasopharyngeal Swab  Result Value Ref Range Status   SARS Coronavirus 2 NEGATIVE NEGATIVE Final    Comment: (NOTE) SARS-CoV-2 target nucleic acids are NOT DETECTED. The SARS-CoV-2 RNA is generally detectable in upper and lower respiratory specimens during the acute phase of infection. Negative results do not preclude SARS-CoV-2 infection, do not rule out co-infections with other pathogens, and should not be used as the sole basis for treatment or other patient management decisions. Negative results must be combined with clinical observations, patient history, and epidemiological information. The expected result is Negative. Fact Sheet for Patients: SugarRoll.be Fact Sheet for Healthcare Providers: https://www.woods-mathews.com/ This test is not yet approved or cleared by the [place] FDA and  has been authorized for detection and/or diagnosis of SARS-CoV-2 by FDA under an Emergency Use Authorization (EUA). This EUA will remain  in effect (meaning this test can be used) for the duration of the COVID-19 declaration under Section 56 4(b)(1) of the Act, 21 U.S.C. section 360bbb-3(b)(1), unless the authorization is terminated or revoked sooner. Performed at [facility], [place] [address], [place], [place] [address]      Labs: BNP (last 3 results) Recent Labs    09/20/19 1347  BNP 99991111*   Basic Metabolic Panel: Recent Labs  Lab 09/20/19 1347 09/21/19 0316 09/22/19 0417 09/23/19 0400 09/24/19 0409  NA 133* 135 138 138 137  K 5.6* 4.8 4.3 4.7 4.6  CL 92* 96* 98 94*  92*  CO2 24 29 30  32 34*  GLUCOSE 303* 126* 41* 86 115*  BUN 51* 51* 45* 36* 34*  CREATININE 1.80* 1.78* 1.44* 1.22* 1.18*  CALCIUM 8.8* 8.7* 8.7* 8.9 8.9   Liver Function Tests: Recent Labs  Lab 09/20/19 1347  AST 20  ALT 17  ALKPHOS 74  BILITOT 0.6  PROT 6.3*  ALBUMIN 3.2*   No results for input(s): LIPASE, AMYLASE in the last 168 hours. No results for input(s): AMMONIA in the last 168 hours. CBC: Recent Labs  Lab 09/18/19 1708 09/20/19 1347 09/23/19 0827 09/24/19 0409  WBC 11.8* 13.4* 9.2 8.6  NEUTROABS 9.8* 11.5*  --   --   HGB 13.3 12.1 11.6* 11.5*  HCT 45.0 39.3 37.7 36.6  MCV 91.8 89.1 89.1 88.2  PLT 85* 235 234 221   Cardiac Enzymes: No results for input(s): CKTOTAL, CKMB, CKMBINDEX, TROPONINI in the last 168 hours. BNP: Invalid input(s): POCBNP CBG: Recent Labs  Lab 09/23/19 0735 09/23/19 1110 09/23/19 1630 09/23/19 2118 09/24/19 0802  GLUCAP 79 136* 130* 189* 95   D-Dimer No results for input(s): DDIMER in the last 72 hours. Hgb A1c No results for input(s): HGBA1C in the last 72 hours. Lipid Profile No results for input(s): CHOL, HDL, LDLCALC, TRIG, CHOLHDL, LDLDIRECT in the last 72 hours. Thyroid function studies No results for input(s): TSH, T4TOTAL, T3FREE, THYROIDAB in the last 72 hours.  Invalid input(s): FREET3 Anemia work up No results for input(s): VITAMINB12, FOLATE, FERRITIN, TIBC, IRON, RETICCTPCT in the last 72 hours. Urinalysis    Component Value Date/Time   COLORURINE STRAW (A) 09/23/2019 1550   APPEARANCEUR CLEAR 09/23/2019 1550   LABSPEC 1.008 09/23/2019 1550   PHURINE 6.0 09/23/2019 1550   GLUCOSEU NEGATIVE 09/23/2019  1550   HGBUR NEGATIVE 09/23/2019 1550   BILIRUBINUR NEGATIVE 09/23/2019 1550   BILIRUBINUR Neg 08/06/2018 1540   KETONESUR NEGATIVE 09/23/2019 1550   PROTEINUR NEGATIVE 09/23/2019 1550   UROBILINOGEN 0.2 08/06/2018 1540   UROBILINOGEN Normal 07/18/2017   NITRITE NEGATIVE 09/23/2019 1550   LEUKOCYTESUR NEGATIVE 09/23/2019 1550   Sepsis Labs Invalid input(s): PROCALCITONIN,  WBC,  LACTICIDVEN Microbiology Recent Results (from the past 240 hour(s))  SARS CORONAVIRUS 2 (TAT 6-24 HRS) Nasopharyngeal Nasopharyngeal Swab     Status: None   Collection Time: 09/18/19  3:19 PM   Specimen: Nasopharyngeal Swab  Result Value Ref Range Status   SARS Coronavirus 2 NEGATIVE NEGATIVE Final    Comment: (NOTE) SARS-CoV-2 target nucleic acids are NOT DETECTED. The SARS-CoV-2 RNA is generally detectable in upper and lower respiratory specimens during the acute phase of infection. Negative results do not preclude SARS-CoV-2 infection, do not rule out co-infections with other pathogens, and should not be used as the sole basis for treatment or other patient management decisions. Negative results must be combined with clinical observations, patient history, and epidemiological information. The expected result is Negative. Fact Sheet for Patients: SugarRoll.be Fact Sheet for Healthcare Providers: https://www.woods-mathews.com/ This test is not yet approved or cleared by the [place] FDA and  has been authorized for detection and/or diagnosis of SARS-CoV-2 by FDA under an Emergency Use Authorization (EUA). This EUA will remain  in effect (meaning this test can be used) for the duration of the COVID-19 declaration under Section 56 4(b)(1) of the Act, 21 U.S.C. section 360bbb-3(b)(1), unless the authorization is terminated or revoked sooner. Performed at [facility], [place] [address], [place], [place] [address]   Urine culture  Status: Abnormal    Collection Time: 09/18/19  8:51 PM   Specimen: Urine, Random  Result Value Ref Range Status   Specimen Description   Final    URINE, RANDOM Performed at [place] [address], [place], [place] [address]    Special Requests   Final    NONE Performed at [facility], [place] [address], [place], [place] [address]    Culture >=100,000 COLONIES/mL KLEBSIELLA PNEUMONIAE (A)  Final   Report Status 09/21/2019 FINAL  Final   Organism ID, Bacteria KLEBSIELLA PNEUMONIAE (A)  Final      Susceptibility   Klebsiella pneumoniae - MIC*    AMPICILLIN >=32 RESISTANT Resistant     CEFAZOLIN <=4 SENSITIVE Sensitive     CEFTRIAXONE <=1 SENSITIVE Sensitive     CIPROFLOXACIN <=0.25 SENSITIVE Sensitive     GENTAMICIN <=1 SENSITIVE Sensitive     IMIPENEM <=0.25 SENSITIVE Sensitive     NITROFURANTOIN 64 INTERMEDIATE Intermediate     TRIMETH/SULFA <=20 SENSITIVE Sensitive     AMPICILLIN/SULBACTAM 4 SENSITIVE Sensitive     PIP/TAZO <=4 SENSITIVE Sensitive     Extended ESBL NEGATIVE Sensitive     * >=100,000 COLONIES/mL KLEBSIELLA PNEUMONIAE  SARS CORONAVIRUS 2 (TAT 6-24 HRS) Nasopharyngeal Nasopharyngeal Swab     Status: None   Collection Time: 09/20/19  2:03 PM   Specimen: Nasopharyngeal Swab  Result Value Ref Range Status   SARS Coronavirus 2 NEGATIVE NEGATIVE Final    Comment: (NOTE) SARS-CoV-2 target nucleic acids are NOT DETECTED. The SARS-CoV-2 RNA is generally detectable in upper and lower respiratory specimens during the acute phase of infection. Negative results do not preclude SARS-CoV-2 infection, do not rule out co-infections with other pathogens, and should not be used as the sole basis for treatment or other patient management decisions. Negative results must be combined with clinical observations, patient history, and epidemiological information. The expected result is Negative. Fact Sheet for  Patients: SugarRoll.be Fact Sheet for Healthcare Providers: https://www.woods-mathews.com/ This test is not yet approved or cleared by the [place] FDA and  has been authorized for detection and/or diagnosis of SARS-CoV-2 by FDA under an Emergency Use Authorization (EUA). This EUA will remain  in effect (meaning this test can be used) for the duration of the COVID-19 declaration under Section 56 4(b)(1) of the Act, 21 U.S.C. section 360bbb-3(b)(1), unless the authorization is terminated or revoked sooner. Performed at [facility], [place] [address], [place], [place] [address]   Blood culture (routine x 2)     Status: None (Preliminary result)   Collection Time: 09/20/19  2:50 PM   Specimen: BLOOD  Result Value Ref Range Status   Specimen Description BLOOD RIGHT ANTECUBITAL  Final   Special Requests   Final    BOTTLES DRAWN AEROBIC AND ANAEROBIC Blood Culture adequate volume   Culture   Final    NO GROWTH 3 DAYS Performed at [facility], [place] [address] [place], [place] [address]    Report Status PENDING  Incomplete  Blood culture (routine x 2)     Status: None (Preliminary result)   Collection Time: 09/20/19  3:00 PM   Specimen: BLOOD  Result Value Ref Range Status   Specimen Description BLOOD RIGHT ANTECUBITAL  Final   Special Requests   Final    BOTTLES DRAWN AEROBIC AND ANAEROBIC Blood Culture adequate volume   Culture   Final    NO GROWTH 3 DAYS Performed at [facility], [address] [place], [place] [address]    Report Status PENDING  Incomplete  SARS CORONAVIRUS 2 (TAT 6-24 HRS) Nasopharyngeal Nasopharyngeal Swab     Status: None   Collection Time: 09/22/19  4:41 PM   Specimen: Nasopharyngeal Swab  Result Value Ref Range Status   SARS Coronavirus 2 NEGATIVE NEGATIVE Final    Comment: (NOTE) SARS-CoV-2 target nucleic acids are NOT DETECTED. The SARS-CoV-2 RNA is generally detectable in upper and  lower respiratory specimens during the acute phase of infection. Negative results do not preclude SARS-CoV-2 infection, do not rule out co-infections with other pathogens, and should not be used as the sole basis for treatment or other patient management decisions. Negative results must be combined with clinical observations, patient history, and epidemiological information. The expected result is Negative. Fact Sheet for Patients: SugarRoll.be Fact Sheet for Healthcare Providers: https://www.woods-mathews.com/ This test is not yet approved or cleared by the [place] FDA and  has been authorized for detection and/or diagnosis of SARS-CoV-2 by FDA under an Emergency Use Authorization (EUA). This EUA will remain  in effect (meaning this test can be used) for the duration of the COVID-19 declaration under Section 56 4(b)(1) of the Act, 21 U.S.C. section 360bbb-3(b)(1), unless the authorization is terminated or revoked sooner. Performed at [facility], [place] [address], [place], [place] [address]      Time coordinating discharge: 40 minutes  SIGNED:   Elmarie Shiley, MD  [facility]

## 2019-09-24 NOTE — TOC Transition Note (Signed)
Transition of Care [facility]) - CM/SW Discharge Note   Patient Details  Name: [name] MRN: [id number] Date of Birth: [birth year]  [facility] CM/SW Contact:  Benard Halsted, LCSW Phone Number: 09/24/2019, 2:30 PM   Clinical Narrative:    Patient will DC to: [facility] [place] Anticipated DC date: 09/24/19 Family notified: Son, [name], at bedside Transport by: Domenica Reamer   Per MD patient ready for DC to [place]. RN, patient, patient's family, and facility notified of DC. Discharge Summary and FL2 sent to facility. RN to call report prior to discharge ([phone] ext. [phone] Room 708). DC packet on chart. Ambulance transport requested for patient.   CSW will sign off for now as social work intervention is no longer needed. Please consult [place] again if new needs arise.  Cedric Fishman, LCSW Clinical Social Worker [phone]    Final next level of care: Skilled Nursing Facility Barriers to Discharge: No Barriers Identified   Patient Goals and CMS Choice Patient states their goals for this hospitalization and ongoing recovery are:: "to get better" CMS Medicare.gov Compare Post Acute Care list provided to:: Patient Choice offered to / list presented to : Patient  Discharge Placement   Existing PASRR number confirmed : 09/24/19          Patient chooses bed at: [facility] [place] Patient to be transferred to facility by: [place] Name of family member notified: Son, [name] Patient and family notified of of transfer: 09/24/19  Discharge Plan and Services In-house Referral: NA Discharge Planning Services: CM Consult Post Acute Care Choice: [place]            DME Agency: NA       HH Arranged: NA          Social Determinants of Health (SDOH) Interventions     Readmission Risk Interventions No flowsheet data found.

## 2019-09-24 NOTE — Care Management Important Message (Signed)
Important Message  Patient Details  Name: [name] MRN: [id number] Date of Birth: Jan 07, 1931   Medicare Important Message Given:  Yes     [name] 09/24/2019, 1:49 PM

## 2019-09-25 LAB — CULTURE, BLOOD (ROUTINE X 2)
Culture: NO GROWTH
Culture: NO GROWTH
Special Requests: ADEQUATE
Special Requests: ADEQUATE

## 2019-09-27 DIAGNOSIS — I5032 Chronic diastolic (congestive) heart failure: Secondary | ICD-10-CM | POA: Diagnosis not present

## 2019-09-27 DIAGNOSIS — R262 Difficulty in walking, not elsewhere classified: Secondary | ICD-10-CM | POA: Diagnosis not present

## 2019-09-27 DIAGNOSIS — J9621 Acute and chronic respiratory failure with hypoxia: Secondary | ICD-10-CM | POA: Diagnosis not present

## 2019-09-27 DIAGNOSIS — N183 Chronic kidney disease, stage 3 unspecified: Secondary | ICD-10-CM | POA: Diagnosis not present

## 2019-10-06 ENCOUNTER — Other Ambulatory Visit: Payer: Self-pay | Admitting: *Deleted

## 2019-10-06 NOTE — Patient Outreach (Signed)
Member screened for potential [facility] Care Management needs as a benefit of  [place] Medicare.  Member is currently receiving skilled therapy at [facility].  Member discussed in weekly telephonic interdisciplinary team meeting with facility staff, [facility] Utilization Management team, and writer.  Per facility, member to discharge home with son on this Saturday.  Writer to plan to outreach to son to discuss [place] Management program services.   Marthenia Rolling, MSN-Ed, RN,BSN [place] Acute Care Coordinator [phone] [facility]) [phone]  (Toll free office)

## 2019-10-07 DIAGNOSIS — Z20828 Contact with and (suspected) exposure to other viral communicable diseases: Secondary | ICD-10-CM | POA: Diagnosis not present

## 2019-10-11 ENCOUNTER — Other Ambulatory Visit: Payer: Self-pay | Admitting: *Deleted

## 2019-10-11 NOTE — Patient Outreach (Signed)
Member screened for potential [facility] Care Management needs as a benefit of [place] Medicare.  Verified in Patient [place] that Mrs. Sefcik discharged from [facility] in [place] on Saturday 10/09/2019. As previously reported by facility staff, member lives with her son [name].  Telephone call made to [phone]. Mrs. Dieleman son [name] answered the phone. [name] expressed skepticism about giving information out over the phone. He was agreeable to Probation officer providing [place] website address and contact information for [place] Management. Also provided writer's contact information should he have any further questions.  Will plan sign off unless member's son expresses interest in [place] Management follow up.   Marthenia Rolling, MSN-Ed, RN,BSN [facility] Acute Care Coordinator [phone] [facility]) [phone]  (Toll free office)

## 2019-10-12 ENCOUNTER — Ambulatory Visit: Payer: Medicare Other | Admitting: Cardiovascular Disease

## 2019-11-24 ENCOUNTER — Ambulatory Visit (INDEPENDENT_AMBULATORY_CARE_PROVIDER_SITE_OTHER): Payer: Medicare Other | Admitting: Cardiovascular Disease

## 2019-11-24 ENCOUNTER — Encounter: Payer: Self-pay | Admitting: Cardiovascular Disease

## 2019-11-24 ENCOUNTER — Other Ambulatory Visit: Payer: Self-pay

## 2019-11-24 VITALS — BP 142/56 | HR 67 | Ht 64.0 in | Wt 197.8 lb

## 2019-11-24 DIAGNOSIS — I10 Essential (primary) hypertension: Secondary | ICD-10-CM

## 2019-11-24 DIAGNOSIS — I5032 Chronic diastolic (congestive) heart failure: Secondary | ICD-10-CM

## 2019-11-24 NOTE — Progress Notes (Signed)
Cardiology Office Note   Date:  11/24/2019   ID:  [name], DOB 07/29/1931, MRN [id number]  PCP:  Hoyt Koch, MD  Cardiologist: Darlin Coco MD, now [place]  Chief Complaint  Patient presents with  . Congestive Heart Failure  . Hyperlipidemia   Problem list 1. Chronic diastolic congestive heart failure 2. History of pericardial effusion-status post pericardial window 3.  Aortic stenosis 4. Diabetes mellitus 5. Coronary artery disease 6. Essential hypertension 7. Hyperlipidemia   Notes from [place] - Feb. 2017:  [name] is a 86 y.o. female who presents for a six-month follow-up visit   She has a past history of diastolic congestive heart failure. Her last echocardiogram on 02/10/12 showed an ejection fraction of 60% with normal systolic function and grade 1 diastolic dysfunction. She has a past history of pericardial effusion with pericardial tamponade and in 2010 and underwen a pericardial window with good results. she has a history of mild aortic stenosis. Since we last saw her she underwent left hip replacement by Dr. Mayer Camel and has done well.  Her last echocardiogram was 02/10/12 and showed an ejection fraction of 60%. Since last visit she has been feeling well. Since last visit her husband has been diagnosed with early Alzheimer's but fortunately he seems to be responding nicely to Aricept.Her last visit we gave her a trial off gabapentin for peripheral neuropathy.Initially she did not think that it helped but now she thinks that it has helped and she would like to try a stronger dose.  We will increase the dose to 600 mg each evening. She reports that she is only taking 80 mg of Lasix daily and this has been sufficient to keep her edema at a minimum. He has not been having any recent chest pain.  She is not having any increase in peripheral edema on the lower dose of Lasix.  Her breathing is improved.  Oct. 18, 2017: Still some dyspnea. Does not get  any regular  exercise Lots of back and leg pain   Jan. 24, 2018:  Was admitted to the hospital in Dec for shortness of breath .  Was found to have Grade 2 diastolic dysfunction  Was found to have CKD - Diovan was stopped.  BP has been elevated since that time   Aug. 28, 2018:  [name] is seen today .    She does have DOE.  Does not exercise.  Has chronic diastolic CHF  April 4, [id number]:  [name]  is seen today for follow-up of her chronic diastolic congestive heart failure and hypertension. Feeling well  Husband died 2 weeks ago .  [name]) ,  Had been in a SNF for 6 months  Walks with walker.  Has lost some weight .  Wt. is 201 lbs today   September 03, 2018: [name] is seen today for follow-up visit.  She has a history of chronic diastolic congestive heart failure.  She has a history of hypertension.  Overall , seems to be doing well   Jan. 20, 2021:  [name] is seen today for follow up of her chronic diastolic congestive heart failure, hypertension, hyperlipidemia. She  [place] in Nov, 2020,  Was in the hospital    Was found to have a transverse type II fracture of the odontoid process.  Was not a candidate for surgical intervention .  Still recovering from the fall.   Legs stay swollen - likely due to her inactivity  Past Medical History:  Diagnosis Date  . Anasarca 04/2009   found to be secondary to pericardial effusion with tamponade/pericardial window done  . Chronic diastolic CHF (congestive heart failure) ([place])   . CKD (chronic kidney disease), stage III   . Coronary artery disease    a. mild-mod CAD 2004.  . Diabetes mellitus    insulin dependent  . Herpes labialis    Healing herpes labialis  . Hypercholesterolemia   . Hypertension   . Lumbar radiculopathy   . Lumbar scoliosis   . Morbid obesity ([place])   . Pericardial effusion 04/2009   a. 2010 with tamponade s/p window.  Marland Kitchen PONV (postoperative nausea and vomiting)    has not had any problems since 2001   . Spondylolisthesis   . Spondylosis     Past Surgical History:  Procedure Laterality Date  . BACK SURGERY     microdiskectomy L3-4 on L/partial facetectomy 3-4 on L/removal of synovial cyst on left L3-4  . BREAST BIOPSY     left  . CARDIAC CATHETERIZATION  04/25/2003   L heart cath w/coronary angiography/R femoral artery/ EF 65%/no mitral regurgitation/ angiography L main coronary artery smooth & normal  . EYE SURGERY     ophthalmoscopy/peritomy adjacent to the limbus from the 8 to 2:30 position superiorly  . lumbar laminectomy    . SUBXYPHOID PERICARDIAL WINDOW  04/2009   for pericardial effusion w/pericardial tamponade/sac drained w/20-French Baard drain/transesophageal echocardiogram confimed complete evacution of pericardial fluid  . TOTAL HIP ARTHROPLASTY  03/06/2012   Procedure: TOTAL HIP ARTHROPLASTY;  Surgeon: Kerin Salen, MD;  Location: [facility];  Service: Orthopedics;  Laterality: Left;  DEPUY/PINNACLE, SUMMIT STEM, CUP POLY & CERAMIC  . VAGINAL HYSTERECTOMY       Current Outpatient Medications  Medication Sig Dispense Refill  . acetaminophen (TYLENOL) 500 MG tablet Take 500 mg by mouth See admin instructions. Take one tablet (500 mg) by mouth twice daily, may also take one tablet (500 mg) mid-day as needed for pain/headache    . amLODipine (NORVASC) 5 MG tablet TAKE ONE TABLET BY MOUTH DAILY 90 tablet 1  . aspirin EC 81 MG tablet Take 81 mg by mouth every morning.     Marland Kitchen atorvastatin (LIPITOR) 40 MG tablet Take 40 mg by mouth every morning.     . ergocalciferol (VITAMIN D2) 50000 UNITS capsule Take 50,000 Units by mouth every Friday.     . feeding supplement, GLUCERNA SHAKE, (GLUCERNA SHAKE) LIQD Take 237 mLs by mouth 2 (two) times daily between meals. 237 mL 0  . fluticasone (FLONASE) 50 MCG/ACT nasal spray Place 2 sprays into both nostrils daily. 16 g 1  . furosemide (LASIX) 80 MG tablet TAKE 1 TABLET BY MOUTH EVERY DAY 90 tablet 1  . gabapentin (NEURONTIN) 300 MG capsule 2  TABLETS BY MOUTH AT BEDTIME 60 capsule 1  . glipiZIDE (GLUCOTROL XL) 5 MG 24 hr tablet Take 5 mg by mouth daily.    . hydrALAZINE (APRESOLINE) 25 MG tablet TAKE 1 TABLET BY MOUTH TWICE A DAY 180 tablet 2  . insulin aspart (NOVOLOG) 100 UNIT/ML injection Inject 3-5 Units into the skin 3 (three) times daily with meals. Based on a sliding scale.    . insulin glargine (LANTUS) 100 UNIT/ML injection Inject 1 Units into the skin 4 (four) times daily.    Marland Kitchen LACTASE PO Take 1 tablet by mouth 3 (three) times daily as needed (if meal includes cheese or other dairy).    Marland Kitchen  metoprolol tartrate (LOPRESSOR) 25 MG tablet Take 25 mg by mouth 2 (two) times daily.    . Multiple Vitamin (MULTIVITAMIN WITH MINERALS) TABS tablet Take 1 tablet by mouth daily.    . ONE TOUCH ULTRA TEST test strip Use as directed    . Polyethyl Glycol-Propyl Glycol (SYSTANE) 0.4-0.3 % SOLN Apply 2 drops to eye daily as needed (dry eyes).     . polyethylene glycol (MIRALAX / GLYCOLAX) 17 g packet Take 17 g by mouth 2 (two) times daily. 14 each 0   No current facility-administered medications for this visit.    Allergies:   Atenolol, Codeine, Lactose intolerance (gi), Lodine [etodolac], Sulfa drugs cross reactors, Sulfamethoxazole, Benzonatate, Escitalopram oxalate, Metformin, Oxycodone-acetaminophen, and Pregabalin    Social History:  The patient  reports that she has never smoked. She has never used smokeless tobacco. She reports that she does not drink alcohol or use drugs.   Family History:  The patient's family history includes Angina in her mother; Coronary artery disease in an other family member; Heart attack in her father; Hypertension in her father; Stroke in her mother.    ROS:  Please see the history of present illness.   Otherwise, review of systems are positive for none.   All other systems are reviewed and negative.    Physical Exam: Blood pressure (!) 142/56, pulse 67, height 5\' 4"  (1.626 m), weight 197 lb 12 oz (89.7  kg), SpO2 95 %.  GEN:   Elderly female,  Examined in the chair  HEENT: Normal NECK: No JVD; No carotid bruits LYMPHATICS: No lymphadenopathy CARDIAC: RRR   RESPIRATORY:  [name] to auscultation without rales, wheezing or rhonchi  ABDOMEN: Soft, non-tender, non-distended MUSCULOSKELETAL:  1-2 + edema  SKIN: Warm and dry NEUROLOGIC:  Alert and oriented x 3   EKG:    NSR at 67.  No ST or T wave changes    Recent Labs: 09/20/2019: ALT 17; B Natriuretic Peptide 1,277.5 09/24/2019: BUN 34; Creatinine, Ser 1.18; Hemoglobin 11.5; Platelets 221; Potassium 4.6; Sodium 137    Lipid Panel    Component Value Date/Time   CHOL 146 06/28/2019 0000   TRIG 230 (A) 06/28/2019 0000   HDL 28 (A) 06/28/2019 0000   CHOLHDL 3.1 04/21/2009 0620   VLDL 19 04/21/2009 0620   LDLCALC 72 06/28/2019 0000    Wt Readings from Last 3 Encounters:  11/24/19 197 lb 12 oz (89.7 kg)  09/24/19 195 lb 12.3 oz (88.8 kg)  03/02/19 195 lb (88.5 kg)     ASSESSMENT AND PLAN:  1.  Chronic diastolic heart failure   she seems to be very stable.  She does have some leg edema but she is very inactive and I suspect that she will always have some degree of leg edema.  Continue current medications.  2.  History of pericarditis:   No signs or symptoms of pericarditis.  3. diabetes mellitus with diabetic neuropathy: Follow-up with Dr. Elyse Hsu.  4. essential hypertension:     Blood pressure is fairly well controlled.  I reminded her to watch her salt.     Current medicines are reviewed at length with the patient today.  The patient does not have concerns regarding medicines.    Labs/ tests ordered today include:   Orders Placed This Encounter  Procedures  . EKG 12-Lead     Mertie Moores, MD  11/24/2019 3:53 PM    [place],  [place], [place]  [address] Pager [facility] Phone: [phone]; Fax: [phone]

## 2019-11-24 NOTE — Patient Instructions (Signed)
Medication Instructions:  Your physician recommends that you continue on your current medications as directed. Please refer to the Current Medication list given to you today.  *If you need a refill on your cardiac medications before your next appointment, please call your pharmacy*  Lab Work: None If you have labs (blood work) drawn today and your tests are completely normal, you will receive your results only by: . MyChart Message (if you have MyChart) OR . A paper copy in the mail If you have any lab test that is abnormal or we need to change your treatment, we will call you to review the results.  Testing/Procedures: None  Follow-Up: At [facility], you and your health needs are our priority.  As part of our continuing mission to provide you with exceptional heart care, we have created designated Provider Care Teams.  These Care Teams include your primary Cardiologist (physician) and Advanced Practice Providers (APPs -  Physician Assistants and Nurse Practitioners) who all work together to provide you with the care you need, when you need it.  Your next appointment:   6 month(s)  The format for your next appointment:   In Person  Provider:   You may see Philip Nahser, MD or one of the following Advanced Practice Providers on your designated Care Team:    Scott Weaver, PA-C  Vin Bhagat, PA-C  Janine Hammond, NP   Other Instructions   

## 2019-12-01 DIAGNOSIS — D631 Anemia in chronic kidney disease: Secondary | ICD-10-CM | POA: Diagnosis not present

## 2019-12-01 DIAGNOSIS — N182 Chronic kidney disease, stage 2 (mild): Secondary | ICD-10-CM | POA: Diagnosis not present

## 2019-12-01 DIAGNOSIS — Z794 Long term (current) use of insulin: Secondary | ICD-10-CM | POA: Diagnosis not present

## 2019-12-01 DIAGNOSIS — E1165 Type 2 diabetes mellitus with hyperglycemia: Secondary | ICD-10-CM | POA: Diagnosis not present

## 2019-12-01 DIAGNOSIS — E782 Mixed hyperlipidemia: Secondary | ICD-10-CM | POA: Diagnosis not present

## 2019-12-03 DIAGNOSIS — E1122 Type 2 diabetes mellitus with diabetic chronic kidney disease: Secondary | ICD-10-CM | POA: Diagnosis not present

## 2019-12-03 DIAGNOSIS — I130 Hypertensive heart and chronic kidney disease with heart failure and stage 1 through stage 4 chronic kidney disease, or unspecified chronic kidney disease: Secondary | ICD-10-CM | POA: Diagnosis not present

## 2019-12-03 DIAGNOSIS — D631 Anemia in chronic kidney disease: Secondary | ICD-10-CM | POA: Diagnosis not present

## 2019-12-03 DIAGNOSIS — Z794 Long term (current) use of insulin: Secondary | ICD-10-CM | POA: Diagnosis not present

## 2019-12-03 DIAGNOSIS — E1142 Type 2 diabetes mellitus with diabetic polyneuropathy: Secondary | ICD-10-CM | POA: Diagnosis not present

## 2019-12-03 DIAGNOSIS — E782 Mixed hyperlipidemia: Secondary | ICD-10-CM | POA: Diagnosis not present

## 2019-12-03 DIAGNOSIS — N1832 Chronic kidney disease, stage 3b: Secondary | ICD-10-CM | POA: Diagnosis not present

## 2019-12-03 DIAGNOSIS — E559 Vitamin D deficiency, unspecified: Secondary | ICD-10-CM | POA: Diagnosis not present

## 2019-12-03 DIAGNOSIS — I5032 Chronic diastolic (congestive) heart failure: Secondary | ICD-10-CM | POA: Diagnosis not present

## 2019-12-03 DIAGNOSIS — E113393 Type 2 diabetes mellitus with moderate nonproliferative diabetic retinopathy without macular edema, bilateral: Secondary | ICD-10-CM | POA: Diagnosis not present

## 2019-12-04 ENCOUNTER — Ambulatory Visit: Payer: Medicare Other

## 2019-12-09 ENCOUNTER — Ambulatory Visit: Payer: Medicare Other

## 2019-12-10 ENCOUNTER — Other Ambulatory Visit: Payer: Self-pay

## 2019-12-10 ENCOUNTER — Emergency Department (HOSPITAL_COMMUNITY): Payer: Medicare Other

## 2019-12-10 ENCOUNTER — Inpatient Hospital Stay (HOSPITAL_COMMUNITY)
Admission: EM | Admit: 2019-12-10 | Discharge: 2019-12-24 | DRG: 291 | Disposition: A | Discharge status: Home / Self Care | Payer: Medicare Other | Attending: Internal Medicine | Admitting: Internal Medicine

## 2019-12-10 ENCOUNTER — Encounter: Payer: Self-pay | Admitting: Family Medicine

## 2019-12-10 ENCOUNTER — Ambulatory Visit (INDEPENDENT_AMBULATORY_CARE_PROVIDER_SITE_OTHER): Payer: Medicare Other | Admitting: Family Medicine

## 2019-12-10 ENCOUNTER — Telehealth: Payer: Self-pay

## 2019-12-10 ENCOUNTER — Telehealth: Payer: Self-pay | Admitting: Cardiovascular Disease

## 2019-12-10 ENCOUNTER — Other Ambulatory Visit: Payer: Self-pay | Admitting: *Deleted

## 2019-12-10 ENCOUNTER — Encounter (HOSPITAL_COMMUNITY): Payer: Self-pay | Admitting: Emergency Medicine

## 2019-12-10 DIAGNOSIS — N183 Chronic kidney disease, stage 3 unspecified: Secondary | ICD-10-CM | POA: Diagnosis present

## 2019-12-10 DIAGNOSIS — T380X5A Adverse effect of glucocorticoids and synthetic analogues, initial encounter: Secondary | ICD-10-CM | POA: Diagnosis not present

## 2019-12-10 DIAGNOSIS — R0600 Dyspnea, unspecified: Secondary | ICD-10-CM | POA: Insufficient documentation

## 2019-12-10 DIAGNOSIS — R0902 Hypoxemia: Secondary | ICD-10-CM | POA: Diagnosis not present

## 2019-12-10 DIAGNOSIS — Z20822 Contact with and (suspected) exposure to covid-19: Secondary | ICD-10-CM | POA: Diagnosis present

## 2019-12-10 DIAGNOSIS — E873 Alkalosis: Secondary | ICD-10-CM | POA: Diagnosis not present

## 2019-12-10 DIAGNOSIS — B9620 Unspecified Escherichia coli [E. coli] as the cause of diseases classified elsewhere: Secondary | ICD-10-CM | POA: Diagnosis present

## 2019-12-10 DIAGNOSIS — T426X5A Adverse effect of other antiepileptic and sedative-hypnotic drugs, initial encounter: Secondary | ICD-10-CM | POA: Diagnosis present

## 2019-12-10 DIAGNOSIS — Z7982 Long term (current) use of aspirin: Secondary | ICD-10-CM

## 2019-12-10 DIAGNOSIS — Z794 Long term (current) use of insulin: Secondary | ICD-10-CM

## 2019-12-10 DIAGNOSIS — N390 Urinary tract infection, site not specified: Secondary | ICD-10-CM | POA: Diagnosis present

## 2019-12-10 DIAGNOSIS — I129 Hypertensive chronic kidney disease with stage 1 through stage 4 chronic kidney disease, or unspecified chronic kidney disease: Secondary | ICD-10-CM | POA: Diagnosis present

## 2019-12-10 DIAGNOSIS — N182 Chronic kidney disease, stage 2 (mild): Secondary | ICD-10-CM

## 2019-12-10 DIAGNOSIS — D631 Anemia in chronic kidney disease: Secondary | ICD-10-CM

## 2019-12-10 DIAGNOSIS — N1832 Chronic kidney disease, stage 3b: Secondary | ICD-10-CM | POA: Diagnosis present

## 2019-12-10 DIAGNOSIS — Z823 Family history of stroke: Secondary | ICD-10-CM

## 2019-12-10 DIAGNOSIS — E1165 Type 2 diabetes mellitus with hyperglycemia: Secondary | ICD-10-CM | POA: Diagnosis not present

## 2019-12-10 DIAGNOSIS — E1122 Type 2 diabetes mellitus with diabetic chronic kidney disease: Secondary | ICD-10-CM | POA: Diagnosis present

## 2019-12-10 DIAGNOSIS — I130 Hypertensive heart and chronic kidney disease with heart failure and stage 1 through stage 4 chronic kidney disease, or unspecified chronic kidney disease: Secondary | ICD-10-CM | POA: Diagnosis not present

## 2019-12-10 DIAGNOSIS — Z66 Do not resuscitate: Secondary | ICD-10-CM | POA: Diagnosis present

## 2019-12-10 DIAGNOSIS — M25561 Pain in right knee: Secondary | ICD-10-CM

## 2019-12-10 DIAGNOSIS — E1142 Type 2 diabetes mellitus with diabetic polyneuropathy: Secondary | ICD-10-CM | POA: Diagnosis present

## 2019-12-10 DIAGNOSIS — G931 Anoxic brain damage, not elsewhere classified: Secondary | ICD-10-CM | POA: Diagnosis present

## 2019-12-10 DIAGNOSIS — I5032 Chronic diastolic (congestive) heart failure: Secondary | ICD-10-CM | POA: Diagnosis present

## 2019-12-10 DIAGNOSIS — N179 Acute kidney failure, unspecified: Secondary | ICD-10-CM | POA: Diagnosis present

## 2019-12-10 DIAGNOSIS — J9601 Acute respiratory failure with hypoxia: Secondary | ICD-10-CM | POA: Diagnosis present

## 2019-12-10 DIAGNOSIS — I5033 Acute on chronic diastolic (congestive) heart failure: Secondary | ICD-10-CM | POA: Diagnosis not present

## 2019-12-10 DIAGNOSIS — Z96642 Presence of left artificial hip joint: Secondary | ICD-10-CM | POA: Diagnosis present

## 2019-12-10 DIAGNOSIS — T502X5A Adverse effect of carbonic-anhydrase inhibitors, benzothiadiazides and other diuretics, initial encounter: Secondary | ICD-10-CM | POA: Diagnosis not present

## 2019-12-10 DIAGNOSIS — R0602 Shortness of breath: Secondary | ICD-10-CM

## 2019-12-10 DIAGNOSIS — G92 Toxic encephalopathy: Secondary | ICD-10-CM | POA: Diagnosis not present

## 2019-12-10 DIAGNOSIS — M1A9XX1 Chronic gout, unspecified, with tophus (tophi): Secondary | ICD-10-CM | POA: Diagnosis present

## 2019-12-10 DIAGNOSIS — Z6834 Body mass index (BMI) 34.0-34.9, adult: Secondary | ICD-10-CM

## 2019-12-10 DIAGNOSIS — I10 Essential (primary) hypertension: Secondary | ICD-10-CM | POA: Diagnosis not present

## 2019-12-10 DIAGNOSIS — E559 Vitamin D deficiency, unspecified: Secondary | ICD-10-CM

## 2019-12-10 DIAGNOSIS — J449 Chronic obstructive pulmonary disease, unspecified: Secondary | ICD-10-CM | POA: Diagnosis present

## 2019-12-10 DIAGNOSIS — E669 Obesity, unspecified: Secondary | ICD-10-CM | POA: Diagnosis present

## 2019-12-10 DIAGNOSIS — N3000 Acute cystitis without hematuria: Secondary | ICD-10-CM | POA: Diagnosis present

## 2019-12-10 DIAGNOSIS — Z79899 Other long term (current) drug therapy: Secondary | ICD-10-CM

## 2019-12-10 DIAGNOSIS — Z8249 Family history of ischemic heart disease and other diseases of the circulatory system: Secondary | ICD-10-CM

## 2019-12-10 DIAGNOSIS — R110 Nausea: Secondary | ICD-10-CM | POA: Diagnosis not present

## 2019-12-10 HISTORY — DX: Chronic obstructive pulmonary disease, unspecified: J44.9

## 2019-12-10 LAB — COMPREHENSIVE METABOLIC PANEL
ALT: 24 U/L (ref 0–44)
AST: 23 U/L (ref 15–41)
Albumin: 3.1 g/dL — ABNORMAL LOW (ref 3.5–5.0)
Alkaline Phosphatase: 57 U/L (ref 38–126)
Anion gap: 10 (ref 5–15)
BUN: 35 mg/dL — ABNORMAL HIGH (ref 8–23)
CO2: 27 mmol/L (ref 22–32)
Calcium: 8.9 mg/dL (ref 8.9–10.3)
Chloride: 105 mmol/L (ref 98–111)
Creatinine, Ser: 1.27 mg/dL — ABNORMAL HIGH (ref 0.44–1.00)
GFR calc Af Amer: 44 mL/min — ABNORMAL LOW (ref >60)
GFR calc non Af Amer: 38 mL/min — ABNORMAL LOW (ref >60)
Glucose, Bld: 167 mg/dL — ABNORMAL HIGH (ref 70–99)
Potassium: 4.2 mmol/L (ref 3.5–5.1)
Sodium: 142 mmol/L (ref 135–145)
Total Bilirubin: 0.7 mg/dL (ref 0.3–1.2)
Total Protein: 5.8 g/dL — ABNORMAL LOW (ref 6.5–8.1)

## 2019-12-10 LAB — CBC WITH DIFFERENTIAL/PLATELET
Abs Immature Granulocytes: 0.06 10*3/uL (ref 0.00–0.07)
Basophils Absolute: 0.1 10*3/uL (ref 0.0–0.1)
Basophils Relative: 1 %
Eosinophils Absolute: 0.1 10*3/uL (ref 0.0–0.5)
Eosinophils Relative: 1 %
HCT: 40.4 % (ref 36.0–46.0)
Hemoglobin: 11.9 g/dL — ABNORMAL LOW (ref 12.0–15.0)
Immature Granulocytes: 1 %
Lymphocytes Relative: 9 %
Lymphs Abs: 1 10*3/uL (ref 0.7–4.0)
MCH: 27.2 pg (ref 26.0–34.0)
MCHC: 29.5 g/dL — ABNORMAL LOW (ref 30.0–36.0)
MCV: 92.2 fL (ref 80.0–100.0)
Monocytes Absolute: 0.8 10*3/uL (ref 0.1–1.0)
Monocytes Relative: 8 %
Neutro Abs: 8.8 10*3/uL — ABNORMAL HIGH (ref 1.7–7.7)
Neutrophils Relative %: 80 %
Platelets: 261 10*3/uL (ref 150–400)
RBC: 4.38 MIL/uL (ref 3.87–5.11)
RDW: 16.1 % — ABNORMAL HIGH (ref 11.5–15.5)
WBC: 11 10*3/uL — ABNORMAL HIGH (ref 4.0–10.5)
nRBC: 0 % (ref 0.0–0.2)

## 2019-12-10 LAB — TROPONIN I (HIGH SENSITIVITY): Troponin I (High Sensitivity): 15 ng/L (ref <18)

## 2019-12-10 LAB — POC SARS CORONAVIRUS 2 AG -  ED: SARS Coronavirus 2 Ag: NEGATIVE

## 2019-12-10 NOTE — Telephone Encounter (Signed)
Can we please get her scheduled for today virtual 1:30 with [place] to assess.

## 2019-12-10 NOTE — ED Triage Notes (Signed)
Pt. Arrived via EMS from Home. Pt. Complains of SOB that has gotten worse over the last 3 days.pt. denies any pain. EMS advised that Pt was 77% upon arrivial and put her on 15 L non rebreather. Pt was alert and oriented. Pt is not on Oxygen at home. Pt currently on 10L NR at 100%.

## 2019-12-10 NOTE — ED Notes (Signed)
Called pt son with pt on speakerphone, explained to son that pt does not meet ALONE criteria as she is alert and oriented x4 and has covid-like sx. Attempted to explain that covid related policies had changed since November at pt last visit. Pt son requests to speak with pt off of speakerphone, explained that RN would assist pt to use room phone to call him if he desired to speak privately to her to limit contamination of RN phone. PT son hung up on RN. Assisted pt to call son will alternate phone; pt speaking to son at this time.

## 2019-12-10 NOTE — Progress Notes (Signed)
Received after hours call from triage team.  Patient's son reported patient was  satting in 70% and having difficulty breathing.  Reportedly refused going to the ED.  Reiterated importance of patient getting evaluated immediately.  Recommended ED and the very least going to urgent care if they continue to refuse ED.

## 2019-12-10 NOTE — Telephone Encounter (Signed)
I spoke to the patient's son [name]) who called because the patient is extremely SOB and has an O2 saturation of 70%.  I recommended the ED, but they are trying to get into the [facility]piratory Clinic @ 5:30 today.    The patient has not had weight gain, just the SOB and low O2.  They will go to the ED, if they cannot get appointment with clinic.

## 2019-12-10 NOTE — Telephone Encounter (Signed)
Patient's son, [name], calling and states that his mother is having issues with her breathing. States that she has congestive heart failure and the breathing state that she is at right now, she sometimes gets to and is normal for her. States that he would like to know if Dr Sharlet Salina could send in an inhaler to see if this helps her breathing? States that he is trying to keep her out of the ER and that it is not an emergent issue right now, but would like to have it just in case. Please advise. CB#: [phone]

## 2019-12-10 NOTE — Telephone Encounter (Signed)
Pt has been scheduled.  °

## 2019-12-10 NOTE — ED Notes (Signed)
Pt titrated from 10L NRB to 8L NRB then to 4L [place] without issue. Sats 96% on 4L [place] at this time. Able to speak in full sentences though labored at time while speaking often.

## 2019-12-10 NOTE — ED Provider Notes (Signed)
[facility] EMERGENCY DEPARTMENT Provider Note   CSN: [id number] Arrival date & time: 12/10/19  2117     History Chief Complaint  Patient presents with  . Shortness of Breath    [name] is a 86 y.o. female.  HPI Patient with shortness of breath.  Began around 3 days ago.  States she feels as if she is carrying extra fluid.  Occasional cough.  States she has COPD.  No fevers.  No chest pain.  States she got a pulse ox at home and it was reading in the 70s.  When EMS arrived she was in the 6s and is feeling much better on a nonrebreather.  States however she checks her weight at home because of her history of CHF and her weight has been stable.  No abdominal pain.  No fevers.  No known Covid contacts.    Past Medical History:  Diagnosis Date  . Anasarca 04/2009   found to be secondary to pericardial effusion with tamponade/pericardial window done  . Chronic diastolic CHF (congestive heart failure) ([place])   . CKD (chronic kidney disease), stage III   . COPD (chronic obstructive pulmonary disease) ([place])   . Coronary artery disease    a. mild-mod CAD 2004.  . Diabetes mellitus    insulin dependent  . Herpes labialis    Healing herpes labialis  . Hypercholesterolemia   . Hypertension   . Lumbar radiculopathy   . Lumbar scoliosis   . Morbid obesity ([place])   . Pericardial effusion 04/2009   a. 2010 with tamponade s/p window.  Marland Kitchen PONV (postoperative nausea and vomiting)    has not had any problems since 2001  . Spondylolisthesis   . Spondylosis     Patient Active Problem List   Diagnosis Date Noted  . Dyspnea 12/10/2019  . Hypoglycemia due to insulin 09/22/2019  . Pressure injury of skin stage II to buttocks 09/21/2019  . Benign hypertension with CKD (chronic kidney disease) stage III 09/21/2019  . Obesity (BMI 30-39.9) 09/21/2019  . UTI due to Klebsiella species 09/21/2019  . Acute kidney injury superimposed on CKD ([place]) 09/20/2019  . Acute metabolic  encephalopathy 123456  . Anemia of chronic renal failure, stage 2 (mild) 02/18/2017  . Diabetic polyneuropathy associated with type 2 diabetes mellitus ([place]) 02/18/2017  . Vitamin D deficiency 02/18/2017  . Acute on chronic diastolic CHF (congestive heart failure) ([place])   . Pulmonary hypertension ([place])   . CKD (chronic kidney disease) stage 3, GFR 30-59 ml/min   . Hypertensive retinopathy of both eyes 09/26/2015  . Pseudophakia of both eyes 09/26/2015  . Uncontrolled diabetes mellitus ([place]) 06/07/2014  . Chronic diastolic CHF (congestive heart failure) ([place]) 03/18/2011  . Mixed dyslipidemia 03/18/2011  . Hypertension, essential     Past Surgical History:  Procedure Laterality Date  . BACK SURGERY     microdiskectomy L3-4 on L/partial facetectomy 3-4 on L/removal of synovial cyst on left L3-4  . BREAST BIOPSY     left  . CARDIAC CATHETERIZATION  04/25/2003   L heart cath w/coronary angiography/R femoral artery/ EF 65%/no mitral regurgitation/ angiography L main coronary artery smooth & normal  . EYE SURGERY     ophthalmoscopy/peritomy adjacent to the limbus from the 8 to 2:30 position superiorly  . lumbar laminectomy    . SUBXYPHOID PERICARDIAL WINDOW  04/2009   for pericardial effusion w/pericardial tamponade/sac drained w/20-French Baard drain/transesophageal echocardiogram confimed complete evacution of pericardial fluid  . TOTAL HIP ARTHROPLASTY  03/06/2012   Procedure: TOTAL HIP ARTHROPLASTY;  Surgeon: Kerin Salen, MD;  Location: [place];  Service: Orthopedics;  Laterality: Left;  DEPUY/PINNACLE, SUMMIT STEM, CUP POLY & CERAMIC  . VAGINAL HYSTERECTOMY       OB History   No obstetric history on file.     Family History  Problem Relation Age of Onset  . Heart attack Father   . Hypertension Father   . Stroke Mother   . Angina Mother   . Coronary artery disease Other     Social History   Tobacco Use  . Smoking status: Never Smoker  . Smokeless tobacco: Never Used    Substance Use Topics  . Alcohol use: No  . Drug use: No    Home Medications Prior to Admission medications   Medication Sig Start Date End Date Taking? Authorizing Provider  acetaminophen (TYLENOL) 500 MG tablet Take 500 mg by mouth See admin instructions. Take one tablet (500 mg) by mouth twice daily, may also take one tablet (500 mg) mid-day as needed for pain/headache    [provider]  amLODipine (NORVASC) 5 MG tablet TAKE ONE TABLET BY MOUTH DAILY 08/23/19   Nahser, Wonda Cheng, MD  aspirin EC 81 MG tablet Take 81 mg by mouth every morning.     [provider]  atorvastatin (LIPITOR) 40 MG tablet Take 40 mg by mouth every morning.  12/31/18   [provider]  ergocalciferol (VITAMIN D2) 50000 UNITS capsule Take 50,000 Units by mouth every Friday.  07/30/11   [provider]  feeding supplement, GLUCERNA SHAKE, (GLUCERNA SHAKE) LIQD Take 237 mLs by mouth 2 (two) times daily between meals. 09/24/19   Regalado, Belkys A, MD  fluticasone (FLONASE) 50 MCG/ACT nasal spray Place 2 sprays into both nostrils daily. 10/15/16   Orson Eva, MD  furosemide (LASIX) 80 MG tablet TAKE 1 TABLET BY MOUTH EVERY DAY 08/09/19   Nahser, Wonda Cheng, MD  gabapentin (NEURONTIN) 300 MG capsule 2 TABLETS BY MOUTH AT BEDTIME 08/06/16   Nahser, Wonda Cheng, MD  glipiZIDE (GLUCOTROL XL) 5 MG 24 hr tablet Take 5 mg by mouth daily. 10/15/19   [provider]  hydrALAZINE (APRESOLINE) 25 MG tablet TAKE 1 TABLET BY MOUTH TWICE A DAY 07/21/19   Nahser, Wonda Cheng, MD  insulin aspart (NOVOLOG) 100 UNIT/ML injection Inject 3-5 Units into the skin 3 (three) times daily with meals. Based on a sliding scale.    [provider]  insulin glargine (LANTUS) 100 UNIT/ML injection Inject 1 Units into the skin 4 (four) times daily.    [provider]  LACTASE PO Take 1 tablet by mouth 3 (three) times daily as needed (if meal includes cheese or other dairy).    [provider]   metoprolol tartrate (LOPRESSOR) 25 MG tablet Take 25 mg by mouth 2 (two) times daily.    [provider]  Multiple Vitamin (MULTIVITAMIN WITH MINERALS) TABS tablet Take 1 tablet by mouth daily.    [provider]  ONE TOUCH ULTRA TEST test strip Use as directed 01/13/19   [provider]  Polyethyl Glycol-Propyl Glycol (SYSTANE) 0.4-0.3 % SOLN Apply 2 drops to eye daily as needed (dry eyes).     [provider]  polyethylene glycol (MIRALAX / GLYCOLAX) 17 g packet Take 17 g by mouth 2 (two) times daily. 09/24/19   Regalado, Belkys A, MD    Allergies    Atenolol, Codeine, Lactose intolerance (gi), Lodine [etodolac], Sulfa drugs  cross reactors, Sulfamethoxazole, Benzonatate, Escitalopram oxalate, Metformin, Oxycodone-acetaminophen, and Pregabalin  Review of Systems   Review of Systems  Constitutional: Negative for appetite change.  HENT: Negative for congestion.   Respiratory: Positive for cough and shortness of breath.   Cardiovascular: Positive for leg swelling. Negative for chest pain.  Gastrointestinal: Negative for abdominal pain.  Genitourinary: Negative for dysuria and flank pain.  Musculoskeletal: Negative for back pain.  Skin: Negative for rash.  Neurological: Positive for weakness.  Hematological: Negative for adenopathy.  Psychiatric/Behavioral: Negative for confusion.    Physical Exam Updated Vital Signs BP (!) 146/64   Pulse 67   Temp 99.4 F (37.4 C) (Rectal)   Resp (!) 22   Ht 5\' 4"  (1.626 m)   Wt 87.5 kg   SpO2 93%   BMI 33.13 kg/m   Physical Exam Vitals and nursing note reviewed.  HENT:     Head: Normocephalic.  Cardiovascular:     Rate and Rhythm: Normal rate.  Pulmonary:     Breath sounds: No wheezing, rhonchi or rales.  Chest:     Chest wall: No tenderness.  Abdominal:     Tenderness: There is no abdominal tenderness.  Musculoskeletal:     Cervical back: Normal range of motion.     Right lower leg: Edema  present.     Left lower leg: Edema present.     Comments: Moderate pitting edema bilateral lower extremities  Skin:    General: Skin is warm.     Capillary Refill: Capillary refill takes less than 2 seconds.  Neurological:     Mental Status: She is alert and oriented to person, place, and time.     ED Results / Procedures / Treatments   Labs (all labs ordered are listed, but only abnormal results are displayed) Labs Reviewed  CBC WITH DIFFERENTIAL/PLATELET - Abnormal; Notable for the following components:      Result Value   WBC 11.0 (*)    Hemoglobin 11.9 (*)    MCHC 29.5 (*)    RDW 16.1 (*)    Neutro Abs 8.8 (*)    All other components within normal limits  COMPREHENSIVE METABOLIC PANEL - Abnormal; Notable for the following components:   Glucose, Bld 167 (*)    BUN 35 (*)    Creatinine, Ser 1.27 (*)    Total Protein 5.8 (*)    Albumin 3.1 (*)    GFR calc non Af Amer 38 (*)    GFR calc Af Amer 44 (*)    All other components within normal limits  RESPIRATORY PANEL BY RT PCR (FLU A&B, COVID)  BRAIN NATRIURETIC PEPTIDE  URINALYSIS, ROUTINE W REFLEX MICROSCOPIC  POC SARS CORONAVIRUS 2 AG -  ED  TROPONIN I (HIGH SENSITIVITY)  TROPONIN I (HIGH SENSITIVITY)    EKG None ED ECG REPORT   Date: 12/10/2019  Rate: 80  Rhythm: normal sinus rhythm  QRS Axis: normal  Intervals: normal  ST/T Wave abnormalities: normal  Conduction Disutrbances:Incomplete left bundle branch block.  Narrative Interpretation:   Old EKG Reviewed: unchanged    Radiology DG Chest Portable 1 View  Result Date: 12/10/2019 CLINICAL DATA:  Shortness of breath. EXAM: PORTABLE CHEST 1 VIEW COMPARISON:  September 20, 2019 FINDINGS: Mild, stable chronic appearing increased interstitial lung markings are seen without evidence of an acute infiltrate, pleural effusion or pneumothorax. The cardiac silhouette is mildly enlarged and unchanged in size. Chronic fifth, sixth, seventh and eighth left rib fractures  are seen. The visualized skeletal  structures are otherwise unremarkable. IMPRESSION: Stable cardiomegaly without evidence of acute or active cardiopulmonary disease. Electronically Signed   By: Virgina Norfolk M.D.   On: 12/10/2019 22:11    Procedures Procedures (including critical care time)  Medications Ordered in ED Medications - No data to display  ED Course  I have reviewed the triage vital signs and the nursing notes.  Pertinent labs & imaging results that were available during my care of the patient were reviewed by me and considered in my medical decision making (see chart for details).    MDM Rules/Calculators/A&P                      Patient presented with hypoxia.  Has been worsening over the last 3 days.  Sats of 77 for EMS.  Initially required nonrebreather but now been able to titrate down to nasal cannula.  X-ray reassuring.  Lungs are clear.  Does have increased swelling in her legs however but has a good weight.  History of both COPD and CHF. With the severe hypoxia I feel that the patient benefit for admission to the hospital.  BNP still pending.  Will discuss with hospitalist. May have a component of CHF and COPD.  Pulmonary embolism pericardial effusion felt less likely.  Point-of-care Covid test was negative but PCR is still pending Final Clinical Impression(s) / ED Diagnoses Final diagnoses:  Hypoxia    Rx / DC Orders ED Discharge Orders    None       Davonna Belling, MD 12/10/19 2329

## 2019-12-10 NOTE — Telephone Encounter (Signed)
Pt c/o Shortness Of Breath: STAT if SOB developed within the last 24 hours or pt is noticeably SOB on the phone  1. Are you currently SOB (can you hear that pt is SOB on the phone)? Son is talking for the pt   2. How long have you been experiencing SOB? 3-4 days  3. Are you SOB when sitting or when up moving around? All the time  4. Are you currently experiencing any other symptoms? Wheezing and breathing issues even when sitting up in a chair  Pulse Ox readings in the 70s. Son said in the past when this is happened she has  Happened she has fluid around her heart and lungs, and she just takes an extra dose of lasix. Son says she even has a hard time breathing walking around using her walker.   Son would like to know if there is anything that can be done at home to keep her out of the hospital. They are waiting to see if she can get an appointment today at the Respiratory clinic at 5:30

## 2019-12-10 NOTE — Progress Notes (Signed)
Virtual Visit via Video Note  I connected with [name] on 12/10/19 at  3:00 PM EST by a video enabled telemedicine application and verified that I am speaking with the correct person using two identifiers.  Location: Patient: home Provider: office   I discussed the limitations of evaluation and management by telemedicine and the availability of in person appointments. The patient expressed understanding and agreed to proceed.  Parties involved in encounter  Patient: [name] Son: Gaspar Bidding   Provider:  Loura Pardon MD    History of Present Illness: Here for sob  Going on for 2 weeks but getting worse   Weight is the same has not gained more than 1-2 lb    Worse with sob Now having trouble at night- has to sleep on bunch of pillows  Legs stay swollen   Is not very active   Had odontoid fx in nov- doing better    Has CHF  Saw her cardiologist last 1/20 and she is stable   No cough  No viral symptoms  Does not feel sick   Pulse ox today is 76% on RA (new pulse oximeter)-? If correct  She is not sob with speech  Declines the ER and would like an appt in the resp clinic     Patient Active Problem List   Diagnosis Date Noted  . Dyspnea 12/10/2019  . Hypoglycemia due to insulin 09/22/2019  . Pressure injury of skin stage II to buttocks 09/21/2019  . Benign hypertension with CKD (chronic kidney disease) stage III 09/21/2019  . Obesity (BMI 30-39.9) 09/21/2019  . Acute lower UTI 09/21/2019  . Acute kidney injury superimposed on CKD ([place]) 09/20/2019  . Acute metabolic encephalopathy 123456  . Anemia of chronic renal failure, stage 2 (mild) 02/18/2017  . Diabetic polyneuropathy associated with type 2 diabetes mellitus ([place]) 02/18/2017  . Vitamin D deficiency 02/18/2017  . Acute on chronic diastolic CHF (congestive heart failure) ([place])   . Pulmonary hypertension ([place])   . CKD (chronic kidney disease) stage 3, GFR 30-59 ml/min   . Acute respiratory  failure with hypoxia ([place])   . Hypertensive retinopathy of both eyes 09/26/2015  . Pseudophakia of both eyes 09/26/2015  . Uncontrolled diabetes mellitus ([place]) 06/07/2014  . Chronic diastolic CHF (congestive heart failure) ([place]) 03/18/2011  . Mixed dyslipidemia 03/18/2011  . Hypertension, essential    Past Medical History:  Diagnosis Date  . Anasarca 04/2009   found to be secondary to pericardial effusion with tamponade/pericardial window done  . Chronic diastolic CHF (congestive heart failure) ([place])   . CKD (chronic kidney disease), stage III   . COPD (chronic obstructive pulmonary disease) ([place])   . Coronary artery disease    a. mild-mod CAD 2004.  . Diabetes mellitus    insulin dependent  . Herpes labialis    Healing herpes labialis  . Hypercholesterolemia   . Hypertension   . Lumbar radiculopathy   . Lumbar scoliosis   . Morbid obesity ([place])   . Pericardial effusion 04/2009   a. 2010 with tamponade s/p window.  Marland Kitchen PONV (postoperative nausea and vomiting)    has not had any problems since 2001  . Spondylolisthesis   . Spondylosis    Past Surgical History:  Procedure Laterality Date  . BACK SURGERY     microdiskectomy L3-4 on L/partial facetectomy 3-4 on L/removal of synovial cyst on left L3-4  . BREAST BIOPSY     left  . CARDIAC CATHETERIZATION  04/25/2003  L heart cath w/coronary angiography/R femoral artery/ EF 65%/no mitral regurgitation/ angiography L main coronary artery smooth & normal  . EYE SURGERY     ophthalmoscopy/peritomy adjacent to the limbus from the 8 to 2:30 position superiorly  . lumbar laminectomy    . SUBXYPHOID PERICARDIAL WINDOW  04/2009   for pericardial effusion w/pericardial tamponade/sac drained w/20-French Baard drain/transesophageal echocardiogram confimed complete evacution of pericardial fluid  . TOTAL HIP ARTHROPLASTY  03/06/2012   Procedure: TOTAL HIP ARTHROPLASTY;  Surgeon: Kerin Salen, MD;  Location: [facility];  Service: Orthopedics;   Laterality: Left;  DEPUY/PINNACLE, SUMMIT STEM, CUP POLY & CERAMIC  . VAGINAL HYSTERECTOMY     Social History   Tobacco Use  . Smoking status: Never Smoker  . Smokeless tobacco: Never Used  Substance Use Topics  . Alcohol use: No  . Drug use: No   Family History  Problem Relation Age of Onset  . Heart attack Father   . Hypertension Father   . Stroke Mother   . Angina Mother   . Coronary artery disease Other    Allergies  Allergen Reactions  . Atenolol Other (See Comments)    weakness  . Codeine Nausea And Vomiting    Hydrocodone is ok  . Lactose Intolerance (Gi) Diarrhea  . Lodine [Etodolac] Other (See Comments)    dizziness  . Sulfa Drugs Cross Reactors Nausea Only  . Sulfamethoxazole Nausea And Vomiting  . Benzonatate Nausea And Vomiting  . Escitalopram Oxalate Other (See Comments)    mild hallucinations  . Metformin Nausea And Vomiting    Reaction to IR and XL  . Oxycodone-Acetaminophen Other (See Comments)    Percocet does not relieve pts' pain  . Pregabalin Other (See Comments)    Unknown reaction   No current facility-administered medications on file prior to visit.   Current Outpatient Medications on File Prior to Visit  Medication Sig Dispense Refill  . acetaminophen (TYLENOL) 500 MG tablet Take 500 mg by mouth See admin instructions. Take one tablet (500 mg) by mouth twice daily, may also take one tablet (500 mg) mid-day as needed for pain/headache    . amLODipine (NORVASC) 5 MG tablet TAKE ONE TABLET BY MOUTH DAILY (Patient taking differently: Take 5 mg by mouth every morning. ) 90 tablet 1  . aspirin EC 81 MG tablet Take 81 mg by mouth every morning.     Marland Kitchen atorvastatin (LIPITOR) 40 MG tablet Take 40 mg by mouth every morning.     . ergocalciferol (VITAMIN D2) 50000 UNITS capsule Take 50,000 Units by mouth every Friday.     . feeding supplement, GLUCERNA SHAKE, (GLUCERNA SHAKE) LIQD Take 237 mLs by mouth 2 (two) times daily between meals. (Patient taking  differently: Take 237 mLs by mouth as needed (supplement diet). ) 237 mL 0  . fluticasone (FLONASE) 50 MCG/ACT nasal spray Place 2 sprays into both nostrils daily. (Patient taking differently: Place 2 sprays into both nostrils as needed for allergies (congestion). ) 16 g 1  . furosemide (LASIX) 80 MG tablet TAKE 1 TABLET BY MOUTH EVERY DAY (Patient taking differently: Take 80 mg by mouth daily. ) 90 tablet 1  . gabapentin (NEURONTIN) 300 MG capsule 2 TABLETS BY MOUTH AT BEDTIME (Patient taking differently: Take 600 mg by mouth at bedtime. ) 60 capsule 1  . glipiZIDE (GLUCOTROL XL) 5 MG 24 hr tablet Take 5 mg by mouth daily.    . hydrALAZINE (APRESOLINE) 25 MG tablet TAKE 1 TABLET BY MOUTH  TWICE A DAY (Patient taking differently: Take 25 mg by mouth 2 (two) times daily. ) 180 tablet 2  . insulin aspart (NOVOLOG) 100 UNIT/ML injection Inject 3-8 Units into the skin 3 (three) times daily with meals. Based on a sliding scale.    . insulin glargine (LANTUS) 100 UNIT/ML injection Inject 20 Units into the skin at bedtime.     Marland Kitchen LACTASE PO Take 1 tablet by mouth 3 (three) times daily as needed (if meal includes cheese or other dairy).    . metoprolol tartrate (LOPRESSOR) 25 MG tablet Take 25 mg by mouth 2 (two) times daily.    . Multiple Vitamin (MULTIVITAMIN WITH MINERALS) TABS tablet Take 1 tablet by mouth daily.    . ONE TOUCH ULTRA TEST test strip 1 each by Other route as needed for other (Insulin). Use as directed    . Polyethyl Glycol-Propyl Glycol (SYSTANE) 0.4-0.3 % SOLN Apply 2 drops to eye daily as needed (dry eyes).     . polyethylene glycol (MIRALAX / GLYCOLAX) 17 g packet Take 17 g by mouth 2 (two) times daily. (Patient not taking: Reported on 12/11/2019) 14 each 0   Review of Systems  Constitutional: Positive for malaise/fatigue. Negative for chills and fever.  HENT: Negative for congestion, ear pain, sinus pain and sore throat.   Eyes: Negative for blurred vision, discharge and redness.   Respiratory: Positive for shortness of breath and wheezing. Negative for cough, sputum production and stridor.   Cardiovascular: Negative for chest pain, palpitations and leg swelling.  Gastrointestinal: Negative for abdominal pain, diarrhea, nausea and vomiting.  Musculoskeletal: Negative for myalgias.  Skin: Negative for rash.  Neurological: Negative for dizziness and headaches.    Observations/Objective: Patient appears well, in no distress (while sitting)  Weight is baseline  No facial swelling or asymmetry Normal voice-not hoarse and no slurred speech No obvious tremor or mobility impairment Moving neck and UEs normally Able to hear the call fairly Mildly sob at rest/during speech   (per pt much worse if she tries to walk) Talkative and mentally sharp with no cognitive changes (son also helps with hx) No skin changes on face or neck , no rash or pallor Affect is normal -very pleasant    Assessment and Plan: Problem List Items Addressed This Visit      Other   Dyspnea    Worse with exertion / mild with speech  Per pt's son- pulse ox is low in 70s (new machine- ? If accurate-pt does not appear to be that hypoxic)  Suspect this is from her CHF (no viral symptoms)  Adv she go to ER now (declines this and wants to be seen in resp clinic)  Will work on this with schedulers  Again- voiced my pref for ER given low pulse ox She will also alert her cardiologist          Follow Up Instructions: Your pulse ox is quite low and I feel you should go to the ER Since you decline our schedulers will work on getting you a respiratory clinic eval today  Please alert your cardiologist re: symptoms as well  If worse please call [phone]    I discussed the assessment and treatment plan with the patient. The patient was provided an opportunity to ask questions and all were answered. The patient agreed with the plan and demonstrated an understanding of the instructions.   The patient was  advised to call back or seek an in-person evaluation if the symptoms  worsen or if the condition fails to improve as anticipated.     Loura Pardon, MD

## 2019-12-11 ENCOUNTER — Inpatient Hospital Stay (HOSPITAL_COMMUNITY): Payer: Medicare Other

## 2019-12-11 ENCOUNTER — Encounter (HOSPITAL_COMMUNITY): Payer: Self-pay | Admitting: Internal Medicine

## 2019-12-11 DIAGNOSIS — N19 Unspecified kidney failure: Secondary | ICD-10-CM | POA: Diagnosis not present

## 2019-12-11 DIAGNOSIS — I5031 Acute diastolic (congestive) heart failure: Secondary | ICD-10-CM | POA: Diagnosis not present

## 2019-12-11 DIAGNOSIS — N179 Acute kidney failure, unspecified: Secondary | ICD-10-CM | POA: Diagnosis not present

## 2019-12-11 DIAGNOSIS — R0902 Hypoxemia: Secondary | ICD-10-CM

## 2019-12-11 DIAGNOSIS — Z96642 Presence of left artificial hip joint: Secondary | ICD-10-CM | POA: Diagnosis present

## 2019-12-11 DIAGNOSIS — N390 Urinary tract infection, site not specified: Secondary | ICD-10-CM | POA: Diagnosis not present

## 2019-12-11 DIAGNOSIS — I5033 Acute on chronic diastolic (congestive) heart failure: Secondary | ICD-10-CM | POA: Diagnosis not present

## 2019-12-11 DIAGNOSIS — E873 Alkalosis: Secondary | ICD-10-CM | POA: Diagnosis not present

## 2019-12-11 DIAGNOSIS — Z20822 Contact with and (suspected) exposure to covid-19: Secondary | ICD-10-CM | POA: Diagnosis not present

## 2019-12-11 DIAGNOSIS — E1122 Type 2 diabetes mellitus with diabetic chronic kidney disease: Secondary | ICD-10-CM | POA: Diagnosis present

## 2019-12-11 DIAGNOSIS — Z6834 Body mass index (BMI) 34.0-34.9, adult: Secondary | ICD-10-CM | POA: Diagnosis not present

## 2019-12-11 DIAGNOSIS — M1A9XX1 Chronic gout, unspecified, with tophus (tophi): Secondary | ICD-10-CM | POA: Diagnosis present

## 2019-12-11 DIAGNOSIS — I129 Hypertensive chronic kidney disease with stage 1 through stage 4 chronic kidney disease, or unspecified chronic kidney disease: Secondary | ICD-10-CM | POA: Diagnosis not present

## 2019-12-11 DIAGNOSIS — N1832 Chronic kidney disease, stage 3b: Secondary | ICD-10-CM | POA: Diagnosis present

## 2019-12-11 DIAGNOSIS — M12861 Other specific arthropathies, not elsewhere classified, right knee: Secondary | ICD-10-CM | POA: Diagnosis not present

## 2019-12-11 DIAGNOSIS — J9601 Acute respiratory failure with hypoxia: Secondary | ICD-10-CM | POA: Diagnosis not present

## 2019-12-11 DIAGNOSIS — T502X5A Adverse effect of carbonic-anhydrase inhibitors, benzothiadiazides and other diuretics, initial encounter: Secondary | ICD-10-CM | POA: Diagnosis not present

## 2019-12-11 DIAGNOSIS — Z66 Do not resuscitate: Secondary | ICD-10-CM | POA: Diagnosis not present

## 2019-12-11 DIAGNOSIS — M79671 Pain in right foot: Secondary | ICD-10-CM | POA: Diagnosis not present

## 2019-12-11 DIAGNOSIS — Z79899 Other long term (current) drug therapy: Secondary | ICD-10-CM | POA: Diagnosis not present

## 2019-12-11 DIAGNOSIS — T426X5A Adverse effect of other antiepileptic and sedative-hypnotic drugs, initial encounter: Secondary | ICD-10-CM | POA: Diagnosis not present

## 2019-12-11 DIAGNOSIS — M79672 Pain in left foot: Secondary | ICD-10-CM | POA: Diagnosis not present

## 2019-12-11 DIAGNOSIS — N183 Chronic kidney disease, stage 3 unspecified: Secondary | ICD-10-CM

## 2019-12-11 DIAGNOSIS — B9620 Unspecified Escherichia coli [E. coli] as the cause of diseases classified elsewhere: Secondary | ICD-10-CM | POA: Diagnosis not present

## 2019-12-11 DIAGNOSIS — N3000 Acute cystitis without hematuria: Secondary | ICD-10-CM | POA: Diagnosis not present

## 2019-12-11 DIAGNOSIS — E669 Obesity, unspecified: Secondary | ICD-10-CM | POA: Diagnosis present

## 2019-12-11 DIAGNOSIS — G92 Toxic encephalopathy: Secondary | ICD-10-CM | POA: Diagnosis not present

## 2019-12-11 DIAGNOSIS — J449 Chronic obstructive pulmonary disease, unspecified: Secondary | ICD-10-CM | POA: Diagnosis present

## 2019-12-11 DIAGNOSIS — I130 Hypertensive heart and chronic kidney disease with heart failure and stage 1 through stage 4 chronic kidney disease, or unspecified chronic kidney disease: Secondary | ICD-10-CM | POA: Diagnosis not present

## 2019-12-11 DIAGNOSIS — E1165 Type 2 diabetes mellitus with hyperglycemia: Secondary | ICD-10-CM | POA: Diagnosis not present

## 2019-12-11 DIAGNOSIS — M25561 Pain in right knee: Secondary | ICD-10-CM | POA: Diagnosis not present

## 2019-12-11 DIAGNOSIS — I509 Heart failure, unspecified: Secondary | ICD-10-CM | POA: Diagnosis not present

## 2019-12-11 DIAGNOSIS — R7989 Other specified abnormal findings of blood chemistry: Secondary | ICD-10-CM | POA: Diagnosis not present

## 2019-12-11 DIAGNOSIS — E1142 Type 2 diabetes mellitus with diabetic polyneuropathy: Secondary | ICD-10-CM | POA: Diagnosis not present

## 2019-12-11 DIAGNOSIS — T380X5A Adverse effect of glucocorticoids and synthetic analogues, initial encounter: Secondary | ICD-10-CM | POA: Diagnosis not present

## 2019-12-11 DIAGNOSIS — G931 Anoxic brain damage, not elsewhere classified: Secondary | ICD-10-CM | POA: Diagnosis not present

## 2019-12-11 LAB — BASIC METABOLIC PANEL
Anion gap: 11 (ref 5–15)
BUN: 33 mg/dL — ABNORMAL HIGH (ref 8–23)
CO2: 30 mmol/L (ref 22–32)
Calcium: 8.7 mg/dL — ABNORMAL LOW (ref 8.9–10.3)
Chloride: 102 mmol/L (ref 98–111)
Creatinine, Ser: 1.25 mg/dL — ABNORMAL HIGH (ref 0.44–1.00)
GFR calc Af Amer: 45 mL/min — ABNORMAL LOW (ref >60)
GFR calc non Af Amer: 39 mL/min — ABNORMAL LOW (ref >60)
Glucose, Bld: 185 mg/dL — ABNORMAL HIGH (ref 70–99)
Potassium: 4.2 mmol/L (ref 3.5–5.1)
Sodium: 143 mmol/L (ref 135–145)

## 2019-12-11 LAB — GLUCOSE, CAPILLARY
Glucose-Capillary: 115 mg/dL — ABNORMAL HIGH (ref 70–99)
Glucose-Capillary: 145 mg/dL — ABNORMAL HIGH (ref 70–99)
Glucose-Capillary: 140 mg/dL — ABNORMAL HIGH (ref 70–99)
Glucose-Capillary: 105 mg/dL — ABNORMAL HIGH (ref 70–99)
Glucose-Capillary: 109 mg/dL — ABNORMAL HIGH (ref 70–99)

## 2019-12-11 LAB — URINALYSIS, ROUTINE W REFLEX MICROSCOPIC
Bilirubin Urine: NEGATIVE
Glucose, UA: NEGATIVE mg/dL
Ketones, ur: NEGATIVE mg/dL
Nitrite: NEGATIVE
Protein, ur: NEGATIVE mg/dL
Specific Gravity, Urine: 1.014 (ref 1.005–1.030)
WBC, UA: >50 WBC/hpf — ABNORMAL HIGH (ref 0–5)
pH: 5 (ref 5.0–8.0)

## 2019-12-11 LAB — ECHOCARDIOGRAM COMPLETE
Height: 64 in
Weight: 3088 oz

## 2019-12-11 LAB — RESPIRATORY PANEL BY RT PCR (FLU A&B, COVID)
Influenza A by PCR: NEGATIVE
Influenza B by PCR: NEGATIVE
SARS Coronavirus 2 by RT PCR: NEGATIVE

## 2019-12-11 LAB — BRAIN NATRIURETIC PEPTIDE: B Natriuretic Peptide: 531.2 pg/mL — ABNORMAL HIGH (ref 0.0–100.0)

## 2019-12-11 LAB — HEMOGLOBIN A1C
Hgb A1c MFr Bld: 6.7 % — ABNORMAL HIGH (ref 4.8–5.6)
Mean Plasma Glucose: 145.59 mg/dL

## 2019-12-11 LAB — TROPONIN I (HIGH SENSITIVITY): Troponin I (High Sensitivity): 18 ng/L — ABNORMAL HIGH (ref <18)

## 2019-12-11 MED ORDER — AMLODIPINE BESYLATE 5 MG PO TABS
5.0000 mg | ORAL_TABLET | Freq: Every day | ORAL | Status: DC
  Administered 2019-12-11 – 2019-12-24 (×14): 5 mg via ORAL
  Filled 2019-12-11 (×14): qty 1

## 2019-12-11 MED ORDER — ADULT MULTIVITAMIN W/MINERALS CH
1.0000 | ORAL_TABLET | Freq: Every day | ORAL | Status: DC
  Administered 2019-12-11 – 2019-12-24 (×14): 1 via ORAL
  Filled 2019-12-11 (×14): qty 1

## 2019-12-11 MED ORDER — POLYVINYL ALCOHOL 1.4 % OP SOLN: 2.0000 [drp] | Freq: Every day | OPHTHALMIC | Status: DC | PRN

## 2019-12-11 MED ORDER — FUROSEMIDE 10 MG/ML IJ SOLN
80.0000 mg | Freq: Once | INTRAMUSCULAR | Status: AC
  Administered 2019-12-11: 80 mg via INTRAVENOUS
  Filled 2019-12-11: qty 8

## 2019-12-11 MED ORDER — SODIUM CHLORIDE 0.9% FLUSH: 3.0000 mL | INTRAVENOUS | Status: DC | PRN

## 2019-12-11 MED ORDER — ACETAMINOPHEN 325 MG PO TABS
650.0000 mg | ORAL_TABLET | ORAL | Status: DC | PRN
  Administered 2019-12-12 – 2019-12-22 (×10): 650 mg via ORAL
  Filled 2019-12-11 (×11): qty 2

## 2019-12-11 MED ORDER — GLUCERNA SHAKE PO LIQD
237.0000 mL | Freq: Two times a day (BID) | ORAL | Status: DC
  Administered 2019-12-11 – 2019-12-24 (×10): 237 mL via ORAL
  Filled 2019-12-11 (×3): qty 711

## 2019-12-11 MED ORDER — INSULIN ASPART 100 UNIT/ML ~~LOC~~ SOLN
0.0000 [IU] | Freq: Three times a day (TID) | SUBCUTANEOUS | Status: DC
  Administered 2019-12-11 – 2019-12-12 (×3): 1 [IU] via SUBCUTANEOUS
  Administered 2019-12-12: 2 [IU] via SUBCUTANEOUS
  Administered 2019-12-13: 0 [IU] via SUBCUTANEOUS
  Administered 2019-12-13: 2 [IU] via SUBCUTANEOUS
  Administered 2019-12-13 – 2019-12-14 (×2): 3 [IU] via SUBCUTANEOUS
  Administered 2019-12-14: 1 [IU] via SUBCUTANEOUS
  Administered 2019-12-14: 2 [IU] via SUBCUTANEOUS
  Administered 2019-12-15: 1 [IU] via SUBCUTANEOUS
  Administered 2019-12-16 – 2019-12-17 (×3): 2 [IU] via SUBCUTANEOUS
  Administered 2019-12-17: 1 [IU] via SUBCUTANEOUS
  Administered 2019-12-18: 9 [IU] via SUBCUTANEOUS
  Administered 2019-12-18: 3 [IU] via SUBCUTANEOUS
  Administered 2019-12-18: 7 [IU] via SUBCUTANEOUS
  Administered 2019-12-19: 5 [IU] via SUBCUTANEOUS
  Administered 2019-12-19 (×2): 7 [IU] via SUBCUTANEOUS
  Administered 2019-12-20: 3 [IU] via SUBCUTANEOUS
  Administered 2019-12-20: 7 [IU] via SUBCUTANEOUS
  Administered 2019-12-20: 3 [IU] via SUBCUTANEOUS
  Administered 2019-12-21: 7 [IU] via SUBCUTANEOUS
  Administered 2019-12-21: 2 [IU] via SUBCUTANEOUS
  Administered 2019-12-21: 3 [IU] via SUBCUTANEOUS
  Administered 2019-12-22: 5 [IU] via SUBCUTANEOUS
  Administered 2019-12-22: 3 [IU] via SUBCUTANEOUS
  Administered 2019-12-22: 2 [IU] via SUBCUTANEOUS

## 2019-12-11 MED ORDER — ASPIRIN EC 81 MG PO TBEC
81.0000 mg | DELAYED_RELEASE_TABLET | Freq: Every morning | ORAL | Status: DC
  Administered 2019-12-11 – 2019-12-24 (×14): 81 mg via ORAL
  Filled 2019-12-11 (×14): qty 1

## 2019-12-11 MED ORDER — ENOXAPARIN SODIUM 40 MG/0.4ML ~~LOC~~ SOLN
40.0000 mg | Freq: Every day | SUBCUTANEOUS | Status: DC
  Administered 2019-12-11 – 2019-12-16 (×7): 40 mg via SUBCUTANEOUS
  Filled 2019-12-11 (×7): qty 0.4

## 2019-12-11 MED ORDER — POLYETHYLENE GLYCOL 3350 17 G PO PACK
17.0000 g | PACK | Freq: Two times a day (BID) | ORAL | Status: DC
  Administered 2019-12-11 – 2019-12-24 (×9): 17 g via ORAL
  Filled 2019-12-11 (×19): qty 1

## 2019-12-11 MED ORDER — ONDANSETRON HCL 4 MG/2ML IJ SOLN: 4.0000 mg | Freq: Four times a day (QID) | INTRAMUSCULAR | Status: DC | PRN

## 2019-12-11 MED ORDER — METOPROLOL TARTRATE 25 MG PO TABS
25.0000 mg | ORAL_TABLET | Freq: Two times a day (BID) | ORAL | Status: DC
  Administered 2019-12-11 – 2019-12-24 (×27): 25 mg via ORAL
  Filled 2019-12-11 (×28): qty 1

## 2019-12-11 MED ORDER — SODIUM CHLORIDE 0.9 % IV SOLN: 250.0000 mL | INTRAVENOUS | Status: DC | PRN

## 2019-12-11 MED ORDER — PERFLUTREN LIPID MICROSPHERE
1.0000 mL | INTRAVENOUS | Status: AC | PRN
  Administered 2019-12-11: 5 mL via INTRAVENOUS
  Filled 2019-12-11: qty 10

## 2019-12-11 MED ORDER — HYDRALAZINE HCL 25 MG PO TABS
25.0000 mg | ORAL_TABLET | Freq: Two times a day (BID) | ORAL | Status: DC
  Administered 2019-12-11 – 2019-12-24 (×27): 25 mg via ORAL
  Filled 2019-12-11 (×27): qty 1

## 2019-12-11 MED ORDER — INSULIN GLARGINE 100 UNIT/ML ~~LOC~~ SOLN
15.0000 [IU] | Freq: Every day | SUBCUTANEOUS | Status: DC
  Administered 2019-12-11 – 2019-12-17 (×8): 15 [IU] via SUBCUTANEOUS
  Filled 2019-12-11 (×10): qty 0.15

## 2019-12-11 MED ORDER — SODIUM CHLORIDE 0.9% FLUSH
3.0000 mL | Freq: Two times a day (BID) | INTRAVENOUS | Status: DC
  Administered 2019-12-11 – 2019-12-24 (×27): 3 mL via INTRAVENOUS

## 2019-12-11 MED ORDER — GABAPENTIN 300 MG PO CAPS
600.0000 mg | ORAL_CAPSULE | Freq: Every day | ORAL | Status: DC
  Administered 2019-12-11 – 2019-12-16 (×6): 600 mg via ORAL
  Filled 2019-12-11 (×6): qty 6

## 2019-12-11 MED ORDER — FLUTICASONE PROPIONATE 50 MCG/ACT NA SUSP
2.0000 | Freq: Every day | NASAL | Status: DC
  Administered 2019-12-11 – 2019-12-24 (×14): 2 via NASAL
  Filled 2019-12-11: qty 16

## 2019-12-11 MED ORDER — FUROSEMIDE 10 MG/ML IJ SOLN
60.0000 mg | Freq: Two times a day (BID) | INTRAMUSCULAR | Status: DC
  Administered 2019-12-11 – 2019-12-15 (×8): 60 mg via INTRAVENOUS
  Filled 2019-12-11 (×9): qty 6

## 2019-12-11 MED ORDER — ATORVASTATIN CALCIUM 40 MG PO TABS
40.0000 mg | ORAL_TABLET | Freq: Every day | ORAL | Status: DC
  Administered 2019-12-11 – 2019-12-23 (×13): 40 mg via ORAL
  Filled 2019-12-11 (×14): qty 1

## 2019-12-11 MED ORDER — SODIUM CHLORIDE 0.9 % IV SOLN
1.0000 g | Freq: Every day | INTRAVENOUS | Status: AC
  Administered 2019-12-11 – 2019-12-13 (×3): 1 g via INTRAVENOUS
  Filled 2019-12-11: qty 1
  Filled 2019-12-11 (×2): qty 10

## 2019-12-11 MED ORDER — INSULIN ASPART 100 UNIT/ML ~~LOC~~ SOLN
3.0000 [IU] | Freq: Three times a day (TID) | SUBCUTANEOUS | Status: DC
  Administered 2019-12-11 – 2019-12-16 (×15): 3 [IU] via SUBCUTANEOUS

## 2019-12-11 NOTE — Progress Notes (Signed)
PROGRESS NOTE        PATIENT DETAILS Name: [name] Age: 86 y.o. Sex: female Date of Birth: [birth year] Admit Date: 12/10/2019 Admitting Physician Etta Quill, DO QU:[id number], Real Cons, MD  Brief Narrative: Patient is a 86 y.o. female with history of chronic diastolic heart failure, CKD stage III, DM-2, HTN, COPD-who presented with worsening shortness of breath-and worsening lower extremity edema (more than the usual baseline)-found to have acute hypoxic respiratory failure (requiring up to 5 L of oxygen on admit) secondary to decompensated diastolic heart failure.  Subjective: Feels slightly better-significant lower extremity edema-still requiring around 3-4 L of oxygen.  Assessment/Plan: Acute hypoxic respiratory failure secondary to decompensated diastolic heart failure: Remains volume overload-has 3+ pitting edema in lower extremities-continue intravenous furosemide.  Follow daily weights, strict intake output-and reassess volume status daily-adjust diuretics accordingly.  Cystitis: Does complain of mild dysuria-we will plan on Rocephin x3 days-await culture results.  CKD stage IIIa: Watch closely while on diuretics-await a.m. labs this morning  HTN: Controlled-continue amlodipine and hydralazine.  DM-2: CBGs appear stable-continue Lantus 15 units nightly, 3 units of NovoLog with meals and SSI.  Follow and adjust.  CBG (last 3)  Recent Labs    12/11/19 0239 12/11/19 0817  GLUCAP 115* 145*   Peripheral neuropathy: Likely secondary to DM-continue Neurontin.  Obesity: Estimated body mass index is 33.13 kg/m as calculated from the following:   Height as of this encounter: 5\' 4"  (1.626 m).   Weight as of this encounter: 87.5 kg.   Diet: Diet Order            Diet heart healthy/carb modified Room service appropriate? Yes; Fluid consistency: Thin  Diet effective now               DVT Prophylaxis: Prophylactic Lovenox  Code  Status: DNR  Family Communication: Left a voicemail for son  Disposition Plan:  Home with Home health vs SNF when ready for discharge (await PT eval)  Barriers to Discharge: Volume overload requiring IV diuretics due to decompensated CHF  Antimicrobial agents: Anti-infectives (From admission, onward)   Start     Dose/Rate Route Frequency Ordered Stop   12/11/19 0145  cefTRIAXone (ROCEPHIN) 1 g in sodium chloride 0.9 % 100 mL IVPB     1 g 200 mL/hr over 30 Minutes Intravenous Daily 12/11/19 0137        Procedures: None  CONSULTS:  None  Time spent: 25- minutes-Greater than 50% of this time was spent in counseling, explanation of diagnosis, planning of further management, and coordination of care.  MEDICATIONS: Scheduled Meds: . amLODipine  5 mg Oral Daily  . aspirin EC  81 mg Oral q morning - 10a  . atorvastatin  40 mg Oral q1800  . enoxaparin (LOVENOX) injection  40 mg Subcutaneous QHS  . feeding supplement (GLUCERNA SHAKE)  237 mL Oral BID BM  . fluticasone  2 spray Each Nare Daily  . furosemide  60 mg Intravenous BID  . gabapentin  600 mg Oral QHS  . hydrALAZINE  25 mg Oral BID  . insulin aspart  0-9 Units Subcutaneous TID WC  . insulin aspart  3 Units Subcutaneous TID WC  . insulin glargine  15 Units Subcutaneous QHS  . metoprolol tartrate  25 mg Oral BID  . multivitamin with minerals  1 tablet Oral Daily  .  polyethylene glycol  17 g Oral BID  . sodium chloride flush  3 mL Intravenous Q12H   Continuous Infusions: . sodium chloride    . cefTRIAXone (ROCEPHIN)  IV Stopped (12/11/19 0546)   PRN Meds:.sodium chloride, acetaminophen, ondansetron (ZOFRAN) IV, polyvinyl alcohol, sodium chloride flush   PHYSICAL EXAM: Vital signs: Vitals:   12/11/19 0000 12/11/19 0045 12/11/19 0100 12/11/19 0821  BP: (!) 132/56 134/65 126/68 (!) 153/62  Pulse: (!) 58 86 82 76  Resp: 20 (!) 30 (!) 21 18  Temp:    97.9 F (36.6 C)  TempSrc:      SpO2: 93% 94% 95% 91%   Weight:      Height:       Filed Weights   12/10/19 2135  Weight: 87.5 kg   Body mass index is 33.13 kg/m.   Gen Exam:Alert awake-not in any distress HEENT:atraumatic, normocephalic Chest: B/L clear to auscultation anteriorly CVS:S1S2 regular Abdomen:soft non tender, non distended Extremities:+++ edema Neurology: Non focal Skin: no rash  I have personally reviewed following labs and imaging studies  LABORATORY DATA: CBC: Recent Labs  Lab 12/10/19 2140  WBC 11.0*  NEUTROABS 8.8*  HGB 11.9*  HCT 40.4  MCV 92.2  PLT 0000000    Basic Metabolic Panel: Recent Labs  Lab 12/10/19 2140  NA 142  K 4.2  CL 105  CO2 27  GLUCOSE 167*  BUN 35*  CREATININE 1.27*  CALCIUM 8.9    GFR: Estimated Creatinine Clearance: 34 mL/min (A) (by C-G formula based on SCr of 1.27 mg/dL (H)).  Liver Function Tests: Recent Labs  Lab 12/10/19 2140  AST 23  ALT 24  ALKPHOS 57  BILITOT 0.7  PROT 5.8*  ALBUMIN 3.1*   No results for input(s): LIPASE, AMYLASE in the last 168 hours. No results for input(s): AMMONIA in the last 168 hours.  Coagulation Profile: No results for input(s): INR, PROTIME in the last 168 hours.  Cardiac Enzymes: No results for input(s): CKTOTAL, CKMB, CKMBINDEX, TROPONINI in the last 168 hours.  BNP (last 3 results) No results for input(s): PROBNP in the last 8760 hours.  Lipid Profile: No results for input(s): CHOL, HDL, LDLCALC, TRIG, CHOLHDL, LDLDIRECT in the last 72 hours.  Thyroid Function Tests: No results for input(s): TSH, T4TOTAL, FREET4, T3FREE, THYROIDAB in the last 72 hours.  Anemia Panel: No results for input(s): VITAMINB12, FOLATE, FERRITIN, TIBC, IRON, RETICCTPCT in the last 72 hours.  Urine analysis:    Component Value Date/Time   COLORURINE YELLOW 12/11/2019 0120   APPEARANCEUR CLOUDY (A) 12/11/2019 0120   LABSPEC 1.014 12/11/2019 0120   PHURINE 5.0 12/11/2019 0120   GLUCOSEU NEGATIVE 12/11/2019 0120   HGBUR SMALL (A)  12/11/2019 0120   BILIRUBINUR NEGATIVE 12/11/2019 0120   BILIRUBINUR Neg 08/06/2018 1540   KETONESUR NEGATIVE 12/11/2019 0120   PROTEINUR NEGATIVE 12/11/2019 0120   UROBILINOGEN 0.2 08/06/2018 1540   UROBILINOGEN Normal 07/18/2017 0000   NITRITE NEGATIVE 12/11/2019 0120   LEUKOCYTESUR LARGE (A) 12/11/2019 0120    Sepsis Labs: Lactic Acid, Venous No results found for: LATICACIDVEN  MICROBIOLOGY: Recent Results (from the past 240 hour(s))  Respiratory Panel by RT PCR (Flu A&B, Covid) - Nasopharyngeal Swab     Status: None   Collection Time: 12/10/19 11:00 PM   Specimen: Nasopharyngeal Swab  Result Value Ref Range Status   SARS Coronavirus 2 by RT PCR NEGATIVE NEGATIVE Final    Comment: (NOTE) SARS-CoV-2 target nucleic acids are NOT DETECTED. The SARS-CoV-2 RNA is  generally detectable in upper respiratoy specimens during the acute phase of infection. The lowest concentration of SARS-CoV-2 viral copies this assay can detect is 131 copies/mL. A negative result does not preclude SARS-Cov-2 infection and should not be used as the sole basis for treatment or other patient management decisions. A negative result may occur with  improper specimen collection/handling, submission of specimen other than nasopharyngeal swab, presence of viral mutation(s) within the areas targeted by this assay, and inadequate number of viral copies (<131 copies/mL). A negative result must be combined with clinical observations, patient history, and epidemiological information. The expected result is Negative. Fact Sheet for Patients:  PinkCheek.be Fact Sheet for Healthcare Providers:  GravelBags.it This test is not yet ap proved or cleared by the [place] FDA and  has been authorized for detection and/or diagnosis of SARS-CoV-2 by FDA under an Emergency Use Authorization (EUA). This EUA will remain  in effect (meaning this test can be used)  for the duration of the COVID-19 declaration under Section 564(b)(1) of the Act, 21 U.S.C. section 360bbb-3(b)(1), unless the authorization is terminated or revoked sooner.    Influenza A by PCR NEGATIVE NEGATIVE Final   Influenza B by PCR NEGATIVE NEGATIVE Final    Comment: (NOTE) The Xpert Xpress SARS-CoV-2/FLU/RSV assay is intended as an aid in  the diagnosis of influenza from Nasopharyngeal swab specimens and  should not be used as a sole basis for treatment. Nasal washings and  aspirates are unacceptable for Xpert Xpress SARS-CoV-2/FLU/RSV  testing. Fact Sheet for Patients: PinkCheek.be Fact Sheet for Healthcare Providers: GravelBags.it This test is not yet approved or cleared by the [place] FDA and  has been authorized for detection and/or diagnosis of SARS-CoV-2 by  FDA under an Emergency Use Authorization (EUA). This EUA will remain  in effect (meaning this test can be used) for the duration of the  Covid-19 declaration under Section 564(b)(1) of the Act, 21  U.S.C. section 360bbb-3(b)(1), unless the authorization is  terminated or revoked. Performed at [facility], [employer] [address] [place], [place] [address]     RADIOLOGY STUDIES/RESULTS: DG Chest Portable 1 View  Result Date: 12/10/2019 CLINICAL DATA:  Shortness of breath. EXAM: PORTABLE CHEST 1 VIEW COMPARISON:  September 20, 2019 FINDINGS: Mild, stable chronic appearing increased interstitial lung markings are seen without evidence of an acute infiltrate, pleural effusion or pneumothorax. The cardiac silhouette is mildly enlarged and unchanged in size. Chronic fifth, sixth, seventh and eighth left rib fractures are seen. The visualized skeletal structures are otherwise unremarkable. IMPRESSION: Stable cardiomegaly without evidence of acute or active cardiopulmonary disease. Electronically Signed   By: Virgina Norfolk M.D.   On: 12/10/2019 22:11      LOS: 0 days   Oren Binet, MD  [facility]    To contact the attending provider between 7A-7P or the covering provider during after hours 7P-7A, please log into the web site [email or web address] and access using universal [place] password for that web site. If you do not have the password, please call the hospital operator.  12/11/2019, 9:58 AM

## 2019-12-11 NOTE — Evaluation (Signed)
Occupational Therapy Evaluation Patient Details Name: [name] MRN: [id number] DOB: Jun 02, 1931 Today's Date: 12/11/2019    History of Present Illness 86 y.o. female with medical history significant of dCHF, CKD stage 3, DM2, HTN, COPD. Patient has had slightly increased SOB and BLE edema from baseline over the past couple of days. Pulse ox in 70%O2 and called EMS, placed on 15 L O2 via [place], in ED able to wean to 5-6L via [place]. Admitted for observation due to found to have acute hypoxic respiratory failure (requiring up to 5 L of oxygen on admit) secondary to decompensated diastolic heart failure.   Clinical Impression   Pt admitted with above. She demonstrates the below listed deficits and will benefit from continued OT to maximize safety and independence with BADLs.  Pt presents to OT with generalized weakness, and decreased activity tolerance.  She requires min guard assist to supervision for ADLs and functional mobility.  02 sats 88-92% on 3L supplemental 02.   Pt reports son lives with her and assists with IADls  - she is mod I for ADLs.  Discussed recommendation for [facility] therapies, but son would prefer not due to Daniel.  Will plan on instructing pt and son on HEP for home.       Follow Up Recommendations  No OT follow up;Supervision - Intermittent    Equipment Recommendations  None recommended by OT    Recommendations for Other Services       Precautions / Restrictions Precautions Precautions: Fall Precaution Comments: hx of falls Restrictions Weight Bearing Restrictions: No      Mobility Bed Mobility Overal bed mobility: Modified Independent             General bed mobility comments: Pt sitting up in chair   Transfers Overall transfer level: Needs assistance Equipment used: Rolling walker (2 wheeled) Transfers: Sit to/from [facility] Sit to Stand: Supervision Stand pivot transfers: Min guard       General transfer comment: supervision for  safety    Balance Overall balance assessment: Needs assistance Sitting-balance support: Feet supported;No upper extremity supported Sitting balance-Leahy Scale: Good     Standing balance support: During functional activity;Single extremity supported;Bilateral upper extremity supported Standing balance-Leahy Scale: Poor Standing balance comment: requires at least single UE support for balance                           ADL either performed or assessed with clinical judgement   ADL Overall ADL's : Needs assistance/impaired Eating/Feeding: Independent   Grooming: Wash/dry hands;Wash/dry face;Oral care;Brushing hair;Set up;Sitting   Upper Body Bathing: Set up;Sitting   Lower Body Bathing: Min guard   Upper Body Dressing : Set up;Sitting   Lower Body Dressing: Min guard;Sit to/from stand   Toilet Transfer: Min guard;Ambulation;Comfort height toilet;Grab bars;RW   Toileting- Water quality scientist and Hygiene: Min guard;Sit to/from stand       Functional mobility during ADLs: Min guard;Rolling walker General ADL Comments: Pt able to tolerate standing for 10 mins indicating functional standing tolerance for ADLs       Vision Baseline Vision/History: Wears glasses       Perception     Praxis      Pertinent Vitals/Pain Pain Assessment: No/denies pain     Hand Dominance Right   Extremity/Trunk Assessment Upper Extremity Assessment Upper Extremity Assessment: Generalized weakness   Lower Extremity Assessment Lower Extremity Assessment: Generalized weakness   Cervical / Trunk Assessment Cervical / Trunk  Assessment: Normal   Communication Communication Communication: No difficulties   Cognition Arousal/Alertness: Awake/alert Behavior During Therapy: WFL for tasks assessed/performed Overall Cognitive Status: Within Functional Limits for tasks assessed                                     General Comments  Pt on 3L supplemental 02 via [place].   Sats remained 88-92% with all activity.  Son present at end of session.  Discussed recommendation for [facility] therapies, he, however, would prefer no therapies at home due to [place].     Exercises     Shoulder Instructions      Home Living Family/patient expects to be discharged to:: Private residence Living Arrangements: Children Available Help at Discharge: Family Type of Home: House Home Access: Stairs to enter [employer] of Steps: 3 Entrance Stairs-Rails: Right;Left;Can reach both Home Layout: Two level;Able to live on main level with bedroom/bathroom     Bathroom Shower/Tub: Occupational psychologist: Handicapped height Bathroom Accessibility: Yes   Home Equipment: Environmental consultant - 2 wheels;Walker - 4 wheels;Shower seat;Grab bars - tub/shower;Toilet riser;Adaptive equipment Adaptive Equipment: Reacher;Sock aid        Prior Functioning/Environment Level of Independence: Independent with assistive device(s)        Comments: walks with RW, lives with son who is home 24/7, independent with ADLS        OT Problem List: Decreased activity tolerance;Impaired balance (sitting and/or standing);Cardiopulmonary status limiting activity      OT Treatment/Interventions: Self-care/ADL training;DME and/or AE instruction;Therapeutic activities;Patient/family education;Balance training;Energy conservation    OT Goals(Current goals can be found in the care plan section) Acute Rehab OT Goals Patient Stated Goal: Pt reports she would like to increase her activity  OT Goal Formulation: With patient/family Time For Goal Achievement: 12/25/19 Potential to Achieve Goals: Good ADL Goals Pt Will Perform Grooming: with modified independence;standing Pt Will Perform Upper Body Bathing: with modified independence;sitting Pt Will Perform Lower Body Bathing: with modified independence;with adaptive equipment;sit to/from stand Pt Will Perform Upper Body Dressing: with modified  independence;sitting Pt Will Perform Lower Body Dressing: with modified independence;sit to/from stand Pt Will Transfer to Toilet: with modified independence;ambulating;regular height toilet;bedside commode;grab bars Pt Will Perform Toileting - Clothing Manipulation and hygiene: with modified independence;sit to/from stand Pt/caregiver will Perform Home Exercise Program: Increased strength;Both right and left upper extremity;With theraband;With Supervision;With written HEP provided  OT Frequency: Min 2X/week   Barriers to D/C:            Co-evaluation              AM-PAC OT "6 Clicks" Daily Activity     Outcome Measure Help from another person eating meals?: None Help from another person taking care of personal grooming?: A Little Help from another person toileting, which includes using toliet, bedpan, or urinal?: A Little Help from another person bathing (including washing, rinsing, drying)?: A Little Help from another person to put on and taking off regular upper body clothing?: A Little Help from another person to put on and taking off regular lower body clothing?: A Little 6 Click Score: 19   End of Session Equipment Utilized During Treatment: Rolling walker;Oxygen Nurse Communication: Mobility status  Activity Tolerance: Patient limited by fatigue Patient left: in chair;with call bell/phone within reach;with family/visitor present  OT Visit Diagnosis: Unsteadiness on feet (R26.81)  Time: 1340-1402 OT Time Calculation (min): 22 min Charges:  OT General Charges $OT Visit: 1 Visit OT Evaluation $OT Eval Moderate Complexity: 1 Mod  Nilsa Nutting., OTR/L Acute Rehabilitation Services Pager [phone] Office [phone]   [name] 12/11/2019, 2:11 PM

## 2019-12-11 NOTE — H&P (Addendum)
History and Physical    [name] [id number] DOB: August 18, 1931 DOA: 12/10/2019  PCP: Hoyt Koch, MD  Patient coming from: Home  I have personally briefly reviewed patient's old medical records in [place]  Chief Complaint: SOB, hypoxia  HPI: [name] is a 86 y.o. female with medical history significant of dCHF, CKD stage 3, DM2, HTN, COPD.  Patient has had slightly increased SOB and BLE edema from baseline over the past couple of days.  Onset about 3 days ago.  Occasional cough.  No fevers, no CP.  Despite minimal symptoms, she took her pulse ox at home, and it was in the 70s, therefore she called EMS (actually called PCP first who told her to call EMS).  Per EMS on their arrival, her pulse ox was indeed in the 70s.  Patient brought to ED.   ED Course: In the ED she has been on 5-6L [place] and satting mid 90s.  Tm 99.4.  CXR neg, WBC 11k.  COVID is neg (as is flu).  Creat 1.27 (actually better than her usual baseline).  BNP 531.  ROS: Ulcer she had back in Nov has healed up, no ulcer currently.   Review of Systems: As per HPI, otherwise all review of systems negative.  Past Medical History:  Diagnosis Date  . Anasarca 04/2009   found to be secondary to pericardial effusion with tamponade/pericardial window done  . Chronic diastolic CHF (congestive heart failure) ([place])   . CKD (chronic kidney disease), stage III   . COPD (chronic obstructive pulmonary disease) ([place])   . Coronary artery disease    a. mild-mod CAD 2004.  . Diabetes mellitus    insulin dependent  . Herpes labialis    Healing herpes labialis  . Hypercholesterolemia   . Hypertension   . Lumbar radiculopathy   . Lumbar scoliosis   . Morbid obesity ([place])   . Pericardial effusion 04/2009   a. 2010 with tamponade s/p window.  Marland Kitchen PONV (postoperative nausea and vomiting)    has not had any problems since 2001  . Spondylolisthesis   . Spondylosis     Past Surgical History:    Procedure Laterality Date  . BACK SURGERY     microdiskectomy L3-4 on L/partial facetectomy 3-4 on L/removal of synovial cyst on left L3-4  . BREAST BIOPSY     left  . CARDIAC CATHETERIZATION  04/25/2003   L heart cath w/coronary angiography/R femoral artery/ EF 65%/no mitral regurgitation/ angiography L main coronary artery smooth & normal  . EYE SURGERY     ophthalmoscopy/peritomy adjacent to the limbus from the 8 to 2:30 position superiorly  . lumbar laminectomy    . SUBXYPHOID PERICARDIAL WINDOW  04/2009   for pericardial effusion w/pericardial tamponade/sac drained w/20-French Baard drain/transesophageal echocardiogram confimed complete evacution of pericardial fluid  . TOTAL HIP ARTHROPLASTY  03/06/2012   Procedure: TOTAL HIP ARTHROPLASTY;  Surgeon: Kerin Salen, MD;  Location: [facility];  Service: Orthopedics;  Laterality: Left;  DEPUY/PINNACLE, SUMMIT STEM, CUP POLY & CERAMIC  . VAGINAL HYSTERECTOMY       reports that she has never smoked. She has never used smokeless tobacco. She reports that she does not drink alcohol or use drugs.  Allergies  Allergen Reactions  . Atenolol Other (See Comments)    weakness  . Codeine Nausea And Vomiting    Hydrocodone is ok  . Lactose Intolerance (Gi) Diarrhea  . Lodine [Etodolac] Other (See Comments)    dizziness  .  Sulfa Drugs Cross Reactors Nausea Only  . Sulfamethoxazole Nausea And Vomiting  . Benzonatate Nausea And Vomiting  . Escitalopram Oxalate Other (See Comments)    mild hallucinations  . Metformin Nausea And Vomiting    Reaction to IR and XL  . Oxycodone-Acetaminophen Other (See Comments)    Percocet does not relieve pts' pain  . Pregabalin Other (See Comments)    Unknown reaction    Family History  Problem Relation Age of Onset  . Heart attack Father   . Hypertension Father   . Stroke Mother   . Angina Mother   . Coronary artery disease Other      Prior to Admission medications   Medication Sig Start Date End  Date Taking? Authorizing Provider  acetaminophen (TYLENOL) 500 MG tablet Take 500 mg by mouth See admin instructions. Take one tablet (500 mg) by mouth twice daily, may also take one tablet (500 mg) mid-day as needed for pain/headache    [provider]  amLODipine (NORVASC) 5 MG tablet TAKE ONE TABLET BY MOUTH DAILY 08/23/19   Nahser, Wonda Cheng, MD  aspirin EC 81 MG tablet Take 81 mg by mouth every morning.     [provider]  atorvastatin (LIPITOR) 40 MG tablet Take 40 mg by mouth every morning.  12/31/18   [provider]  ergocalciferol (VITAMIN D2) 50000 UNITS capsule Take 50,000 Units by mouth every Friday.  07/30/11   [provider]  feeding supplement, GLUCERNA SHAKE, (GLUCERNA SHAKE) LIQD Take 237 mLs by mouth 2 (two) times daily between meals. 09/24/19   Regalado, Belkys A, MD  fluticasone (FLONASE) 50 MCG/ACT nasal spray Place 2 sprays into both nostrils daily. 10/15/16   Orson Eva, MD  furosemide (LASIX) 80 MG tablet TAKE 1 TABLET BY MOUTH EVERY DAY 08/09/19   Nahser, Wonda Cheng, MD  gabapentin (NEURONTIN) 300 MG capsule 2 TABLETS BY MOUTH AT BEDTIME 08/06/16   Nahser, Wonda Cheng, MD  glipiZIDE (GLUCOTROL XL) 5 MG 24 hr tablet Take 5 mg by mouth daily. 10/15/19   [provider]  hydrALAZINE (APRESOLINE) 25 MG tablet TAKE 1 TABLET BY MOUTH TWICE A DAY 07/21/19   Nahser, Wonda Cheng, MD  insulin aspart (NOVOLOG) 100 UNIT/ML injection Inject 3-5 Units into the skin 3 (three) times daily with meals. Based on a sliding scale.    [provider]  insulin glargine (LANTUS) 100 UNIT/ML injection Inject 1 Units into the skin 4 (four) times daily.    [provider]  LACTASE PO Take 1 tablet by mouth 3 (three) times daily as needed (if meal includes cheese or other dairy).    [provider]  metoprolol tartrate (LOPRESSOR) 25 MG tablet Take 25 mg by mouth 2 (two) times daily.    [provider]  Multiple Vitamin (MULTIVITAMIN  WITH MINERALS) TABS tablet Take 1 tablet by mouth daily.    [provider]  ONE TOUCH ULTRA TEST test strip Use as directed 01/13/19   [provider]  Polyethyl Glycol-Propyl Glycol (SYSTANE) 0.4-0.3 % SOLN Apply 2 drops to eye daily as needed (dry eyes).     [provider]  polyethylene glycol (MIRALAX / GLYCOLAX) 17 g packet Take 17 g by mouth 2 (two) times daily. 09/24/19   Elmarie Shiley, MD    Physical Exam: Vitals:   12/10/19 2300 12/11/19 0000 12/11/19 0045 12/11/19 0100  BP: (!) 146/64 (!) 132/56 134/65 126/68  Pulse: 67 (!) 58 86 82  Resp: (!)  22 20 (!) 30 (!) 21  Temp:      TempSrc:      SpO2: 93% 93% 94% 95%  Weight:      Height:        Constitutional: NAD, calm, comfortable Eyes: PERRL, lids and conjunctivae normal ENMT: Mucous membranes are moist. Posterior pharynx clear of any exudate or lesions.Normal dentition.  Neck: normal, supple, no masses, no thyromegaly Respiratory: clear to auscultation bilaterally, no wheezing, no crackles. Normal respiratory effort. No accessory muscle use.  Cardiovascular: Regular rate and rhythm, no murmurs / rubs / gallops. BLE 2-3+ pitting edema. 2+ pedal pulses. No carotid bruits.  Abdomen: no tenderness, no masses palpated. No hepatosplenomegaly. Bowel sounds positive.  Musculoskeletal: no clubbing / cyanosis. No joint deformity upper and lower extremities. Good ROM, no contractures. Normal muscle tone.  Skin: no rashes, lesions, ulcers. No induration Neurologic: CN 2-12 grossly intact. Sensation intact, DTR normal. Strength 5/5 in all 4.  Psychiatric: Normal judgment and insight. Alert and oriented x 3. Normal mood.    Labs on Admission: I have personally reviewed following labs and imaging studies  CBC: Recent Labs  Lab 12/10/19 2140  WBC 11.0*  NEUTROABS 8.8*  HGB 11.9*  HCT 40.4  MCV 92.2  PLT 0000000   Basic Metabolic Panel: Recent Labs  Lab 12/10/19 2140  NA 142  K 4.2  CL 105  CO2  27  GLUCOSE 167*  BUN 35*  CREATININE 1.27*  CALCIUM 8.9   GFR: Estimated Creatinine Clearance: 34 mL/min (A) (by C-G formula based on SCr of 1.27 mg/dL (H)). Liver Function Tests: Recent Labs  Lab 12/10/19 2140  AST 23  ALT 24  ALKPHOS 57  BILITOT 0.7  PROT 5.8*  ALBUMIN 3.1*   No results for input(s): LIPASE, AMYLASE in the last 168 hours. No results for input(s): AMMONIA in the last 168 hours. Coagulation Profile: No results for input(s): INR, PROTIME in the last 168 hours. Cardiac Enzymes: No results for input(s): CKTOTAL, CKMB, CKMBINDEX, TROPONINI in the last 168 hours. BNP (last 3 results) No results for input(s): PROBNP in the last 8760 hours. HbA1C: No results for input(s): HGBA1C in the last 72 hours. CBG: No results for input(s): GLUCAP in the last 168 hours. Lipid Profile: No results for input(s): CHOL, HDL, LDLCALC, TRIG, CHOLHDL, LDLDIRECT in the last 72 hours. Thyroid Function Tests: No results for input(s): TSH, T4TOTAL, FREET4, T3FREE, THYROIDAB in the last 72 hours. Anemia Panel: No results for input(s): VITAMINB12, FOLATE, FERRITIN, TIBC, IRON, RETICCTPCT in the last 72 hours. Urine analysis:    Component Value Date/Time   COLORURINE YELLOW 12/11/2019 0120   APPEARANCEUR CLOUDY (A) 12/11/2019 0120   LABSPEC 1.014 12/11/2019 0120   PHURINE 5.0 12/11/2019 0120   GLUCOSEU NEGATIVE 12/11/2019 0120   HGBUR SMALL (A) 12/11/2019 0120   BILIRUBINUR NEGATIVE 12/11/2019 0120   BILIRUBINUR Neg 08/06/2018 1540   KETONESUR NEGATIVE 12/11/2019 0120   PROTEINUR NEGATIVE 12/11/2019 0120   UROBILINOGEN 0.2 08/06/2018 1540   UROBILINOGEN Normal 07/18/2017 0000   NITRITE NEGATIVE 12/11/2019 0120   LEUKOCYTESUR LARGE (A) 12/11/2019 0120    Radiological Exams on Admission: DG Chest Portable 1 View  Result Date: 12/10/2019 CLINICAL DATA:  Shortness of breath. EXAM: PORTABLE CHEST 1 VIEW COMPARISON:  September 20, 2019 FINDINGS: Mild, stable chronic appearing  increased interstitial lung markings are seen without evidence of an acute infiltrate, pleural effusion or pneumothorax. The cardiac silhouette is mildly enlarged and unchanged in size. Chronic fifth, sixth, seventh  and eighth left rib fractures are seen. The visualized skeletal structures are otherwise unremarkable. IMPRESSION: Stable cardiomegaly without evidence of acute or active cardiopulmonary disease. Electronically Signed   By: Virgina Norfolk M.D.   On: 12/10/2019 22:11    EKG: Independently reviewed.  Assessment/Plan Principal Problem:   Acute respiratory failure with hypoxia (HCC) Active Problems:   Acute on chronic diastolic CHF (congestive heart failure) (HCC)   Diabetic polyneuropathy associated with type 2 diabetes mellitus ([place])   Benign hypertension with CKD (chronic kidney disease) stage III   Acute lower UTI    1. Acute resp failure with hypoxia - suspect acute on chronic diastolic CHF as cause 1. CXR neg, COVID neg 2. CHF pathway 3. Lasix 80mg  IV now 4. Then 60mg  IV BID for the moment 5. Strict intake and output 6. Daily BMPs 7. Tele monitor 8. Cont pulse ox 2. UTI - 1. Rocephin 2. Culture pending 3. CKD stage 3 - chronic and baseline, monitor daily BMP 4. HTN - cont home BP meds 5. DM2 - 1. Takes lantus 20u QHS at home (per endocrine note 12/03/19), and 3-4u novolog breakfast and lunch, 6-8u novolog dinner 2. Will put on lantus 15u QHS here 3. 3u novolog TID AC 4. And sensitive SSI AC 5. Hold home PO hypoglycemics  DVT prophylaxis: Lovenox Code Status: DNR Family Communication: No family in room Disposition Plan: Home after admit Consults called: None Admission status: Admit to inpatient   Rael Tilly M. DO [facility]  How to contact the [facility] Attending or Consulting provider [place] or covering provider during after hours [place], for this patient?  1. Check the care team in [facility] and look for a) attending/consulting [facility] provider listed and  b) the [facility] team listed 2. Log into [email or web address]  Amion Physician Scheduling and messaging for groups and whole hospitals  On call and physician scheduling software for group practices, residents, hospitalists and other medical providers for call, clinic, rotation and shift schedules. On[employer] is a hospital-wide system for scheduling doctors and paging doctors on call. EasyPlot is for scientific plotting and data analysis.  [email or web address]  and use [place]'s universal password to access. If you do not have the password, please contact the hospital operator.  3. Locate the [facility] provider you are looking for under [facility] and page to a number that you can be directly reached. 4. If you still have difficulty reaching the provider, please page the [facility] (Director on Call) for the Hospitalists listed on amion for assistance.  12/11/2019, 1:38 AM

## 2019-12-11 NOTE — Progress Notes (Signed)
  Echocardiogram 2D Echocardiogram has been performed with Def[facility].  [name] 12/11/2019, 12:38 PM

## 2019-12-11 NOTE — ED Notes (Signed)
Dr. Alcario Drought paged to RN

## 2019-12-11 NOTE — Evaluation (Signed)
Physical Therapy Evaluation Patient Details Name: [name] MRN: [id number] DOB: 25-May-1931 Today's Date: 12/11/2019   History of Present Illness  86 y.o. female with medical history significant of dCHF, CKD stage 3, DM2, HTN, COPD. Patient has had slightly increased SOB and BLE edema from baseline over the past couple of days. Pulse ox in 70%O2 and called EMS, placed on 15 L O2 via NRB, in ED able to wean to 5-6L via . Admitted for observation due to found to have acute hypoxic respiratory failure (requiring up to 5 L of oxygen on admit) secondary to decompensated diastolic heart failure.  Clinical Impression  PTA pt living with son (who is available 24/7) in multilevel home with her bed and bath on the main level. Pt is independent in limited community ambulation with RW and is independent in ADLs. Pt is currently limited in safe mobility by increased oxygen demand (see General Comments) as well as decreased strength and endurance. PT recommending HHPT level rehab at discharge. PT will continue to follow acutely.     Follow Up Recommendations Home health PT;Supervision/Assistance - 24 hour    Equipment Recommendations  None recommended by PT    Recommendations for Other Services       Precautions / Restrictions Precautions Precautions: Fall Precaution Comments: hx of falls Restrictions Weight Bearing Restrictions: No      Mobility  Bed Mobility Overal bed mobility: Modified Independent             General bed mobility comments: increased time and effort, HoB elevated, use of bedrail, has rail on bed at home  Transfers Overall transfer level: Needs assistance Equipment used: Rolling walker (2 wheeled) Transfers: Sit to/from Stand Sit to Stand: Supervision         General transfer comment: supervision for safety  Ambulation/Gait Ambulation/Gait assistance: Min guard Gait Distance (Feet): 20 Feet Assistive device: Rolling walker (2 wheeled) Gait  Pattern/deviations: Step-through pattern;Shuffle;Decreased stride length Gait velocity: slowed Gait velocity interpretation: <1.8 ft/sec, indicate of risk for recurrent falls General Gait Details: min guard for safety, slow shuffling gait, vc for proximity to RW and upright posture   Stairs            Wheelchair Mobility    Modified Rankin (Stroke Patients Only)       Balance Overall balance assessment: Needs assistance Sitting-balance support: Feet supported;No upper extremity supported Sitting balance-Leahy Scale: Good     Standing balance support: During functional activity;Single extremity supported;Bilateral upper extremity supported Standing balance-Leahy Scale: Poor Standing balance comment: requires at least single UE support for balance                             Pertinent Vitals/Pain Pain Assessment: No/denies pain    Home Living Family/patient expects to be discharged to:: Private residence Living Arrangements: Children Available Help at Discharge: Family Type of Home: House Home Access: Stairs to enter Entrance Stairs-Rails: Right;Left;Can reach both Entrance Stairs-Number of Steps: 3 Home Layout: Two level;Able to live on main level with bedroom/bathroom Home Equipment: Gilford Rile - 2 wheels;Walker - 4 wheels;Shower seat;Grab bars - tub/shower;Toilet riser      Prior Function Level of Independence: Independent with assistive device(s)         Comments: walks with RW, lives with son who is home 24/7, independent with ADLS     Hand Dominance        Extremity/Trunk Assessment   Upper Extremity Assessment Upper Extremity  Assessment: Defer to OT evaluation    Lower Extremity Assessment Lower Extremity Assessment: Generalized weakness       Communication   Communication: No difficulties  Cognition Arousal/Alertness: Awake/alert Behavior During Therapy: WFL for tasks assessed/performed Overall Cognitive Status: Within Functional  Limits for tasks assessed                                        General Comments General comments (skin integrity, edema, etc.): Pt on 4L O2 via [place] on entry with SaO2 96%O2 with ambulation SaO2 dropped to 83%O2, quickly returned to 90%O2 with supine in bed and pursed lipped breathing         Assessment/Plan    PT Assessment Patient needs continued PT services  PT Problem List Decreased strength;Decreased activity tolerance;Decreased balance;Cardiopulmonary status limiting activity       PT Treatment Interventions DME instruction;Gait training;Functional mobility training;Stair training;Therapeutic activities;Therapeutic exercise;Balance training;Cognitive remediation;Patient/family education    PT Goals (Current goals can be found in the Care Plan section)  Acute Rehab PT Goals Patient Stated Goal: get out of hospital soon PT Goal Formulation: With patient Time For Goal Achievement: 12/25/19 Potential to Achieve Goals: Good    Frequency Min 3X/week    AM-PAC PT "6 Clicks" Mobility  Outcome Measure Help needed turning from your back to your side while in a flat bed without using bedrails?: None Help needed moving from lying on your back to sitting on the side of a flat bed without using bedrails?: A Little Help needed moving to and from a bed to a chair (including a wheelchair)?: None Help needed standing up from a chair using your arms (e.g., wheelchair or bedside chair)?: None Help needed to walk in hospital room?: None Help needed climbing 3-5 steps with a railing? : A Little 6 Click Score: 22    End of Session Equipment Utilized During Treatment: Gait belt;Oxygen Activity Tolerance: Patient tolerated treatment well Patient left: in bed;with call bell/phone within reach;Other (comment)(ECHO present) Nurse Communication: Mobility status PT Visit Diagnosis: Unsteadiness on feet (R26.81);Other abnormalities of gait and mobility (R26.89);Muscle weakness  (generalized) (M62.81);History of falling (Z91.81);Difficulty in walking, not elsewhere classified (R26.2)    Time: [id number] PT Time Calculation (min) (ACUTE ONLY): 30 min   Charges:   PT Evaluation $PT Eval Moderate Complexity: 1 Mod PT Treatments $Gait Training: 8-22 mins        Anayely Constantine B. Migdalia Dk PT, DPT Acute Rehabilitation Services Pager [phone] Office [phone]   [place] 12/11/2019, 11:54 AM

## 2019-12-11 NOTE — ED Notes (Signed)
Placed patient on bedpan.  Patient unable to urinate at this time.

## 2019-12-12 ENCOUNTER — Ambulatory Visit: Payer: Medicare Other

## 2019-12-12 LAB — BASIC METABOLIC PANEL
Anion gap: 11 (ref 5–15)
BUN: 37 mg/dL — ABNORMAL HIGH (ref 8–23)
CO2: 28 mmol/L (ref 22–32)
Calcium: 8.7 mg/dL — ABNORMAL LOW (ref 8.9–10.3)
Chloride: 103 mmol/L (ref 98–111)
Creatinine, Ser: 1.39 mg/dL — ABNORMAL HIGH (ref 0.44–1.00)
GFR calc Af Amer: 40 mL/min — ABNORMAL LOW (ref >60)
GFR calc non Af Amer: 34 mL/min — ABNORMAL LOW (ref >60)
Glucose, Bld: 118 mg/dL — ABNORMAL HIGH (ref 70–99)
Potassium: 4.8 mmol/L (ref 3.5–5.1)
Sodium: 142 mmol/L (ref 135–145)

## 2019-12-12 LAB — GLUCOSE, CAPILLARY
Glucose-Capillary: 109 mg/dL — ABNORMAL HIGH (ref 70–99)
Glucose-Capillary: 123 mg/dL — ABNORMAL HIGH (ref 70–99)
Glucose-Capillary: 182 mg/dL — ABNORMAL HIGH (ref 70–99)
Glucose-Capillary: 160 mg/dL — ABNORMAL HIGH (ref 70–99)

## 2019-12-12 NOTE — Progress Notes (Signed)
PROGRESS NOTE    [name]  [id number] DOB: 1931-09-07 DOA: 12/10/2019 PCP: Hoyt Koch, MD   Brief Narrative:  HPI on 12/11/2019 by Dr. Jennette Kettle Marshal [name] is a 86 y.o. female with medical history significant of dCHF, CKD stage 3, DM2, HTN, COPD.  Patient has had slightly increased SOB and BLE edema from baseline over the past couple of days.  Onset about 3 days ago.  Occasional cough.  No fevers, no CP.  Despite minimal symptoms, she took her pulse ox at home, and it was in the 70s, therefore she called EMS (actually called PCP first who told her to call EMS).  Per EMS on their arrival, her pulse ox was indeed in the 70s.  Patient brought to ED.  Interim history Admitted with acute hypoxic respiratory failure secondary to CHF exacerbation, currently on IV Lasix. Assessment & Plan   Acute hypoxic respiratory failure secondary to decompensated diastolic heart failure -Patient presented with shortness of breath and bilateral lower extremity edema -Seems the patient's pulse ox was reading approximately 70s, when she arrived via EMS, oxygen saturations were in the 70s, patient was placed on nonrebreather. -BNP 531 -Continue IV Lasix -Echocardiogram shows an EF of 60 to 65%, elevated LV end-diastolic pressure, LV diastolic parameters consistent with grade 1 diastolic dysfunction -Monitor intake and output, daily weights -Will attempt to wean oxygen as possible  Cystitis/UTI -UA showed many bacteria,>50 WBC, large leukocytes -Urine culture pending -Continue ceftriaxone  Chronic kidney disease, stage IIIa -Reactive and stable, continue to monitor closely given diuresis  Essential hypertension -Stable, continue amlodipine, hydralazine  Diabetes mellitus, type II -Continue Lantus, insulin sliding scale, 3u with meals, CBG monitoring  Peripheral neuropathy -Likely secondary to diabetes -Continue gabapentin  Obesity -BMI of 34 -Patient to follow-up  with PCP to discuss lifestyle modifications  DVT Prophylaxis Lovenox  Code Status: DNR  Family Communication: None at bedside  Disposition Plan: Admitted from home for acute CHF exacerbation with hypoxic respiratory failure.  Currently on IV Lasix.  Disposition likely home with home health in 1 to 2 days.  Consultants None  Procedures  Echocardiogram  Antibiotics   Anti-infectives (From admission, onward)   Start     Dose/Rate Route Frequency Ordered Stop   12/11/19 0145  cefTRIAXone (ROCEPHIN) 1 g in sodium chloride 0.9 % 100 mL IVPB     1 g 200 mL/hr over 30 Minutes Intravenous Daily 12/11/19 0137 12/14/19 0559      Subjective:   [name] seen and examined today.  Patient continues to have some shortness of breath and leg swelling.  Denies current chest pain, abdominal pain, nausea vomiting, diarrhea constipation, dizziness or headache.  Objective:   Vitals:   12/11/19 2232 12/12/19 0127 12/12/19 0353 12/12/19 0740  BP: (!) 143/58 (!) 138/59  (!) 138/54  Pulse: 62   61  Resp: 20 20  20   Temp: 98.7 F (37.1 C)   98.5 F (36.9 C)  TempSrc: Oral   Oral  SpO2: 91% 96%  96%  Weight:   91.2 kg   Height:        Intake/Output Summary (Last 24 hours) at 12/12/2019 1328 Last data filed at 12/12/2019 0525 Gross per 24 hour  Intake 3 ml  Output 750 ml  Net -747 ml   Filed Weights   12/10/19 2135 12/12/19 0353  Weight: 87.5 kg 91.2 kg    Exam  General: Well developed, well nourished, NAD, appears stated age  70: NCAT,  mucous membranes moist.   Cardiovascular: S1 S2 auscultated, RRR  Respiratory: Diminished breath sounds  Abdomen: Soft, nontender, nondistended, + bowel sounds  Extremities: warm dry without cyanosis clubbing. +++LE edema B/L   Neuro: AAOx3, nonfocal  Psych: Normal affect and demeanor with intact judgement and insight   Data Reviewed: I have personally reviewed following labs and imaging studies  CBC: Recent Labs  Lab 12/10/19 2140   WBC 11.0*  NEUTROABS 8.8*  HGB 11.9*  HCT 40.4  MCV 92.2  PLT 0000000   Basic Metabolic Panel: Recent Labs  Lab 12/10/19 2140 12/11/19 0955 12/12/19 0533  NA 142 143 142  K 4.2 4.2 4.8  CL 105 102 103  CO2 27 30 28   GLUCOSE 167* 185* 118*  BUN 35* 33* 37*  CREATININE 1.27* 1.25* 1.39*  CALCIUM 8.9 8.7* 8.7*   GFR: Estimated Creatinine Clearance: 31.8 mL/min (A) (by C-G formula based on SCr of 1.39 mg/dL (H)). Liver Function Tests: Recent Labs  Lab 12/10/19 2140  AST 23  ALT 24  ALKPHOS 57  BILITOT 0.7  PROT 5.8*  ALBUMIN 3.1*   No results for input(s): LIPASE, AMYLASE in the last 168 hours. No results for input(s): AMMONIA in the last 168 hours. Coagulation Profile: No results for input(s): INR, PROTIME in the last 168 hours. Cardiac Enzymes: No results for input(s): CKTOTAL, CKMB, CKMBINDEX, TROPONINI in the last 168 hours. BNP (last 3 results) No results for input(s): PROBNP in the last 8760 hours. HbA1C: Recent Labs    12/11/19 0955  HGBA1C 6.7*   CBG: Recent Labs  Lab 12/11/19 1237 12/11/19 1640 12/11/19 2114 12/12/19 0743 12/12/19 1232  GLUCAP 140* 105* 109* 109* 123*   Lipid Profile: No results for input(s): CHOL, HDL, LDLCALC, TRIG, CHOLHDL, LDLDIRECT in the last 72 hours. Thyroid Function Tests: No results for input(s): TSH, T4TOTAL, FREET4, T3FREE, THYROIDAB in the last 72 hours. Anemia Panel: No results for input(s): VITAMINB12, FOLATE, FERRITIN, TIBC, IRON, RETICCTPCT in the last 72 hours. Urine analysis:    Component Value Date/Time   COLORURINE YELLOW 12/11/2019 0120   APPEARANCEUR CLOUDY (A) 12/11/2019 0120   LABSPEC 1.014 12/11/2019 0120   PHURINE 5.0 12/11/2019 0120   GLUCOSEU NEGATIVE 12/11/2019 0120   HGBUR SMALL (A) 12/11/2019 0120   BILIRUBINUR NEGATIVE 12/11/2019 0120   BILIRUBINUR Neg 08/06/2018 1540   KETONESUR NEGATIVE 12/11/2019 0120   PROTEINUR NEGATIVE 12/11/2019 0120   UROBILINOGEN 0.2 08/06/2018 1540    UROBILINOGEN Normal 07/18/2017 0000   NITRITE NEGATIVE 12/11/2019 0120   LEUKOCYTESUR LARGE (A) 12/11/2019 0120   Sepsis Labs: @LABRCNTIP (procalcitonin:4,lacticidven:4)  ) Recent Results (from the past 240 hour(s))  Respiratory Panel by RT PCR (Flu A&B, Covid) - Nasopharyngeal Swab     Status: None   Collection Time: 12/10/19 11:00 PM   Specimen: Nasopharyngeal Swab  Result Value Ref Range Status   SARS Coronavirus 2 by RT PCR NEGATIVE NEGATIVE Final    Comment: (NOTE) SARS-CoV-2 target nucleic acids are NOT DETECTED. The SARS-CoV-2 RNA is generally detectable in upper respiratoy specimens during the acute phase of infection. The lowest concentration of SARS-CoV-2 viral copies this assay can detect is 131 copies/mL. A negative result does not preclude SARS-Cov-2 infection and should not be used as the sole basis for treatment or other patient management decisions. A negative result may occur with  improper specimen collection/handling, submission of specimen other than nasopharyngeal swab, presence of viral mutation(s) within the areas targeted by this assay, and inadequate number of viral copies (<  131 copies/mL). A negative result must be combined with clinical observations, patient history, and epidemiological information. The expected result is Negative. Fact Sheet for Patients:  PinkCheek.be Fact Sheet for Healthcare Providers:  GravelBags.it This test is not yet ap proved or cleared by the [place] FDA and  has been authorized for detection and/or diagnosis of SARS-CoV-2 by FDA under an Emergency Use Authorization (EUA). This EUA will remain  in effect (meaning this test can be used) for the duration of the COVID-19 declaration under Section 564(b)(1) of the Act, 21 U.S.C. section 360bbb-3(b)(1), unless the authorization is terminated or revoked sooner.    Influenza A by PCR NEGATIVE NEGATIVE Final   Influenza  B by PCR NEGATIVE NEGATIVE Final    Comment: (NOTE) The Xpert Xpress SARS-CoV-2/FLU/RSV assay is intended as an aid in  the diagnosis of influenza from Nasopharyngeal swab specimens and  should not be used as a sole basis for treatment. Nasal washings and  aspirates are unacceptable for Xpert Xpress SARS-CoV-2/FLU/RSV  testing. Fact Sheet for Patients: PinkCheek.be Fact Sheet for Healthcare Providers: GravelBags.it This test is not yet approved or cleared by the [place] FDA and  has been authorized for detection and/or diagnosis of SARS-CoV-2 by  FDA under an Emergency Use Authorization (EUA). This EUA will remain  in effect (meaning this test can be used) for the duration of the  Covid-19 declaration under Section 564(b)(1) of the Act, 21  U.S.C. section 360bbb-3(b)(1), unless the authorization is  terminated or revoked. Performed at [facility], [place] [address] [place], [place] [address]       Radiology Studies: DG Chest Portable 1 View  Result Date: 12/10/2019 CLINICAL DATA:  Shortness of breath. EXAM: PORTABLE CHEST 1 VIEW COMPARISON:  September 20, 2019 FINDINGS: Mild, stable chronic appearing increased interstitial lung markings are seen without evidence of an acute infiltrate, pleural effusion or pneumothorax. The cardiac silhouette is mildly enlarged and unchanged in size. Chronic fifth, sixth, seventh and eighth left rib fractures are seen. The visualized skeletal structures are otherwise unremarkable. IMPRESSION: Stable cardiomegaly without evidence of acute or active cardiopulmonary disease. Electronically Signed   By: Virgina Norfolk M.D.   On: 12/10/2019 22:11   ECHOCARDIOGRAM COMPLETE  Result Date: 12/11/2019   ECHOCARDIOGRAM REPORT   Patient Name:   [facility]MRI [facility] Date of Exam: 12/11/2019 Medical Rec #:  [id number]    Height:       64.0 in Accession #:    [id number]   Weight:       193.0 lb Date of Birth:   09-05-1931     BSA:          1.93 m Patient Age:    14 years     BP:           153/62 mmHg Patient Gender: F            HR:           76 bpm. Exam Location:  Inpatient Procedure: 2D Echo and Intracardiac Opacification Agent Indications:    CHF - Acute Diastolic  History:        Patient has prior history of Echocardiogram examinations, most                 recent 09/21/2019. COPD; Risk Factors:Diabetes and Hypertension.                 CKD.  Sonographer:    Clayton Lefort RDCS (AE) Referring Phys: [phone] JARED  M GARDNER IMPRESSIONS  1. Left ventricular ejection fraction, by visual estimation, is 60 to 65%. The left ventricle has normal function. There is moderately increased left ventricular hypertrophy.  2. Definity contrast agent was given IV to delineate the left ventricular endocardial borders.  3. Elevated left ventricular end-diastolic pressure.  4. Left ventricular diastolic parameters are consistent with Grade I diastolic dysfunction (impaired relaxation).  5. The left ventricle has no regional wall motion abnormalities.  6. Global right ventricle has normal systolic function.The right ventricular size is mildly enlarged. Right vetricular wall thickness was not assessed.  7. Left atrial size was severely dilated.  8. Right atrial size was mildly dilated.  9. Mild to moderate mitral annular calcification. 10. The mitral valve is grossly normal. Trivial mitral valve regurgitation. 11. The tricuspid valve is grossly normal. 12. The tricuspid valve is grossly normal. Tricuspid valve regurgitation is mild. 13. The aortic valve is tricuspid. Aortic valve regurgitation is not visualized. Mild aortic valve sclerosis without stenosis. 14. The pulmonic valve was grossly normal. Pulmonic valve regurgitation is not visualized. 15. Moderately elevated pulmonary artery systolic pressure. 16. The inferior vena cava is normal in size with greater than 50% respiratory variability, suggesting right atrial pressure of 3 mmHg. 17. The  interatrial septum was not well visualized. FINDINGS  Left Ventricle: Left ventricular ejection fraction, by visual estimation, is 60 to 65%. The left ventricle has normal function. Definity contrast agent was given IV to delineate the left ventricular endocardial borders. The left ventricle has no regional wall motion abnormalities. The left ventricular internal cavity size was the left ventricle is normal in size. There is moderately increased left ventricular hypertrophy. Concentric left ventricular hypertrophy. Left ventricular diastolic parameters are consistent with Grade I diastolic dysfunction (impaired relaxation). Elevated left ventricular end-diastolic pressure. Right Ventricle: The right ventricular size is mildly enlarged. Right vetricular wall thickness was not assessed. Global RV systolic function is has normal systolic function. The tricuspid regurgitant velocity is 3.81 m/s, and with an assumed right atrial pressure of 3 mmHg, the estimated right ventricular systolic pressure is moderately elevated at 61.1 mmHg. Left Atrium: Left atrial size was severely dilated. Right Atrium: Right atrial size was mildly dilated Pericardium: There is no evidence of pericardial effusion. Mitral Valve: The mitral valve is grossly normal. Mild to moderate mitral annular calcification. Trivial mitral valve regurgitation. Tricuspid Valve: The tricuspid valve is grossly normal. Tricuspid valve regurgitation is mild. Aortic Valve: The aortic valve is tricuspid. . There is mild thickening of the aortic valve. Aortic valve regurgitation is not visualized. Mild aortic valve sclerosis is present, with no evidence of aortic valve stenosis. There is mild thickening of the aortic valve. Aortic valve mean gradient measures 7.0 mmHg. Aortic valve peak gradient measures 11.2 mmHg. Aortic valve area, by VTI measures 2.32 cm. Pulmonic Valve: The pulmonic valve was grossly normal. Pulmonic valve regurgitation is not visualized.  Pulmonic regurgitation is not visualized. Aorta: The aortic root is normal in size and structure. Venous: The inferior vena cava is normal in size with greater than 50% respiratory variability, suggesting right atrial pressure of 3 mmHg. IAS/Shunts: The interatrial septum was not well visualized.  LEFT VENTRICLE PLAX 2D LVIDd:         4.30 cm  Diastology LVIDs:         2.60 cm  LV e' lateral:   8.81 cm/s LV PW:         1.40 cm  LV E/e' lateral: 11.5 LV IVS:  1.60 cm  LV e' medial:    5.98 cm/s LVOT diam:     1.90 cm  LV E/e' medial:  16.9 LV SV:         58 ml LV SV Index:   28.90 LVOT Area:     2.84 cm  RIGHT VENTRICLE             IVC RV Basal diam:  3.10 cm     IVC diam: 1.60 cm RV S prime:     13.40 cm/s TAPSE (M-mode): 3.7 cm LEFT ATRIUM             Index       RIGHT ATRIUM           Index LA diam:        4.00 cm 2.08 cm/m  RA Area:     16.30 cm LA Vol (A2C):   95.5 ml 49.56 ml/m RA Volume:   42.90 ml  22.26 ml/m LA Vol (A4C):   94.7 ml 49.14 ml/m LA Biplane Vol: 97.0 ml 50.34 ml/m  AORTIC VALVE AV Area (Vmax):    2.16 cm AV Area (Vmean):   2.08 cm AV Area (VTI):     2.32 cm AV Vmax:           167.00 cm/s AV Vmean:          124.000 cm/s AV VTI:            0.374 m AV Peak Grad:      11.2 mmHg AV Mean Grad:      7.0 mmHg LVOT Vmax:         127.00 cm/s LVOT Vmean:        91.100 cm/s LVOT VTI:          0.306 m LVOT/AV VTI ratio: 0.82  AORTA Ao Root diam: 2.80 cm MITRAL VALVE                         TRICUSPID VALVE MV Area (PHT): 2.45 cm              TR Peak grad:   58.1 mmHg MV PHT:        89.90 msec            TR Vmax:        381.00 cm/s MV Decel Time: 310 msec MV E velocity: 101.00 cm/s 103 cm/s  SHUNTS MV A velocity: 126.00 cm/s 70.3 cm/s Systemic VTI:  0.31 m MV E/A ratio:  0.80        1.5       Systemic Diam: 1.90 cm  Kate Sable MD Electronically signed by Kate Sable MD Signature Date/Time: 12/11/2019/1:51:52 PM    Final      Scheduled Meds: . amLODipine  5 mg Oral Daily  .  aspirin EC  81 mg Oral q morning - 10a  . atorvastatin  40 mg Oral q1800  . enoxaparin (LOVENOX) injection  40 mg Subcutaneous QHS  . feeding supplement (GLUCERNA SHAKE)  237 mL Oral BID BM  . fluticasone  2 spray Each Nare Daily  . furosemide  60 mg Intravenous BID  . gabapentin  600 mg Oral QHS  . hydrALAZINE  25 mg Oral BID  . insulin aspart  0-9 Units Subcutaneous TID WC  . insulin aspart  3 Units Subcutaneous TID WC  . insulin glargine  15 Units Subcutaneous QHS  . metoprolol tartrate  25 mg Oral  BID  . multivitamin with minerals  1 tablet Oral Daily  . polyethylene glycol  17 g Oral BID  . sodium chloride flush  3 mL Intravenous Q12H   Continuous Infusions: . sodium chloride    . cefTRIAXone (ROCEPHIN)  IV 1 g (12/12/19 0543)     LOS: 1 day   Time Spent in minutes   45 minutes  Syvanna Ciolino D.O. on 12/12/2019 at 1:28 PM  Between 7am to 7pm - Please see pager noted on [email or web address]  After 7pm go to [email or web address]  And look for the night coverage person covering for me after hours  [facility] Office  [phone]

## 2019-12-12 NOTE — Progress Notes (Signed)
Pt is strict I and Os. After pt had 3rd incontinent episode of the day, RN spoke with Ree Kida MD. Pt has attempted to use purewick 3 times today with no success in suction/capturing of urine. MD clarified that although pt has UTI she has received enough doses of IV antibiotic and urine culture is pending that the benefit of foley catheter is needed.   RN educated pt and son about this decision. Pt stated she wanted foley catheter. Son asked for Ree Kida MD to call him to reassure that this is best next intervention. RN paged MD. MD attempted to call son - it went straight to VM. MD left message that she is avaialbe to talk until 1900 today and then tmrw if unable to call back by that time.   RN will continue to monitor pt and complete foley care.

## 2019-12-12 NOTE — Assessment & Plan Note (Signed)
Worse with exertion / mild with speech  Per pt's son- pulse ox is low in 70s (new machine- ? If accurate-pt does not appear to be that hypoxic)  Suspect this is from her CHF (no viral symptoms)  Adv she go to ER now (declines this and wants to be seen in resp clinic)  Will work on this with schedulers  Again- voiced my pref for ER given low pulse ox She will also alert her cardiologist

## 2019-12-12 NOTE — Patient Instructions (Signed)
Your pulse ox is quite low and I feel you should go to the ER Since you decline our schedulers will work on getting you a respiratory clinic eval today  Please alert your cardiologist re: symptoms as well  If worse please call [phone]

## 2019-12-12 NOTE — Progress Notes (Signed)
Occupational Therapy Treatment Patient Details Name: [name] MRN: [id number] DOB: [birth year] Today's Date: 12/12/2019    History of present illness 86 y.o. female with medical history significant of dCHF, CKD stage 3, DM2, HTN, COPD. Patient has had slightly increased SOB and BLE edema from baseline over the past couple of days. Pulse ox in 70%O2 and called EMS, placed on 15 L O2 via NRB, in ED able to wean to 5-6L via Gantt. Admitted for observation due to found to have acute hypoxic respiratory failure (requiring up to 5 L of oxygen on admit) secondary to decompensated diastolic heart failure.   OT comments  Pt provided with HEP for strengthening and ROM bil. Shoulders/UEs.  She and son were able to demonstrate and verbalize understanding.   Follow Up Recommendations  No OT follow up;Supervision - Intermittent    Equipment Recommendations  None recommended by OT    Recommendations for Other Services      Precautions / Restrictions Precautions Precautions: Fall Precaution Comments: hx of falls       Mobility Bed Mobility Overal bed mobility: Modified Independent                Transfers Overall transfer level: Needs assistance Equipment used: None Transfers: Sit to/from [facility] Sit to Stand: Min guard Stand pivot transfers: Min guard            Balance Overall balance assessment: Needs assistance Sitting-balance support: Feet supported;No upper extremity supported Sitting balance-Leahy Scale: Good                                     ADL either performed or assessed with clinical judgement   ADL Overall ADL's : Needs assistance/impaired Eating/Feeding: Independent                       Toilet Transfer: Min guard;Stand-pivot;BSC   Toileting- Clothing Manipulation and Hygiene: Minimal assistance;Sit to/from stand       Functional mobility during ADLs: Min guard       Vision       Perception      Praxis      Cognition Arousal/Alertness: Awake/alert Behavior During Therapy: WFL for tasks assessed/performed Overall Cognitive Status: Within Functional Limits for tasks assessed                                          Exercises Exercises: Other exercises Other Exercises Other Exercises: Pt provided with HEP for bil. UEs via medbridge.  She and son were instructed how to access Medbridge and pt was instructed on exercises - resisted horizontal abduction, diagonals, and bicep curls with level 1 theraaband, as well as shoulder shrugs/rolls, and scpular retraction with shoulder extension.  Pt performed 10 reps each of theraband exercises each UE, and 5 reps of shoulder/scap exercises (non resisted) due to pain/discomfort due to muscle tightness.    Shoulder Instructions       General Comments      Pertinent Vitals/ Pain       Pain Assessment: Faces Faces Pain Scale: Hurts even more Pain Location: neck and shoulders with ROM exercises  Pain Descriptors / Indicators: Grimacing;Guarding Pain Intervention(s): Monitored during session  Home Living  Prior Functioning/Environment              Frequency  Min 2X/week        Progress Toward Goals  OT Goals(current goals can now be found in the care plan section)  Progress towards OT goals: Progressing toward goals     Plan Discharge plan remains appropriate    Co-evaluation                 AM-PAC OT "6 Clicks" Daily Activity     Outcome Measure   Help from another person eating meals?: None Help from another person taking care of personal grooming?: A Little Help from another person toileting, which includes using toliet, bedpan, or urinal?: A Little Help from another person bathing (including washing, rinsing, drying)?: A Little Help from another person to put on and taking off regular upper body clothing?: A Little Help from  another person to put on and taking off regular lower body clothing?: A Little 6 Click Score: 19    End of Session Equipment Utilized During Treatment: Oxygen  OT Visit Diagnosis: Unsteadiness on feet (R26.81)   Activity Tolerance Patient tolerated treatment well   Patient Left in chair;with call bell/phone within reach;with family/visitor present   Nurse Communication Mobility status        Time: [id number] OT Time Calculation (min): 24 min  Charges: OT General Charges $OT Visit: 1 Visit OT Treatments $Self Care/Home Management : 8-22 mins $Therapeutic Exercise: 8-22 mins  Nilsa Nutting., OTR/L Acute Rehabilitation Services Pager [phone] Office [phone]    [name] 12/12/2019, 2:52 PM

## 2019-12-13 LAB — BASIC METABOLIC PANEL
Anion gap: 13 (ref 5–15)
BUN: 35 mg/dL — ABNORMAL HIGH (ref 8–23)
CO2: 32 mmol/L (ref 22–32)
Calcium: 8.9 mg/dL (ref 8.9–10.3)
Chloride: 96 mmol/L — ABNORMAL LOW (ref 98–111)
Creatinine, Ser: 1.27 mg/dL — ABNORMAL HIGH (ref 0.44–1.00)
GFR calc Af Amer: 44 mL/min — ABNORMAL LOW (ref >60)
GFR calc non Af Amer: 38 mL/min — ABNORMAL LOW (ref >60)
Glucose, Bld: 104 mg/dL — ABNORMAL HIGH (ref 70–99)
Potassium: 4.3 mmol/L (ref 3.5–5.1)
Sodium: 141 mmol/L (ref 135–145)

## 2019-12-13 LAB — URINE CULTURE: Culture: >=100000 — AB

## 2019-12-13 LAB — GLUCOSE, CAPILLARY
Glucose-Capillary: 74 mg/dL (ref 70–99)
Glucose-Capillary: 180 mg/dL — ABNORMAL HIGH (ref 70–99)
Glucose-Capillary: 241 mg/dL — ABNORMAL HIGH (ref 70–99)
Glucose-Capillary: 218 mg/dL — ABNORMAL HIGH (ref 70–99)

## 2019-12-13 NOTE — Progress Notes (Signed)
Patient removed foley catheter. Catheter intact. Replaced foley catheter, urine output noted. Patient tolerated well.

## 2019-12-13 NOTE — Progress Notes (Signed)
Physical Therapy Treatment Patient Details Name: [name] MRN: [id number] DOB: 03/13/1931 Today's Date: 12/13/2019    History of Present Illness 86 y.o. female with medical history significant of dCHF, CKD stage 3, DM2, HTN, COPD. Patient has had slightly increased SOB and BLE edema from baseline over the past couple of days. Pulse ox in 70%O2 and called EMS, placed on 15 L O2 via [facility], in ED able to wean to 5-6L via [place]. Admitted for observation due to found to have acute hypoxic respiratory failure (requiring up to 5 L of oxygen on admit) secondary to decompensated diastolic heart failure.    PT Comments    Pt making steady progress. Recommend return home with son when medically ready.    Follow Up Recommendations  No PT follow up;Supervision for mobility/OOB     Equipment Recommendations  None recommended by PT(pt and family decline HH due to covid risk)    Recommendations for Other Services       Precautions / Restrictions Precautions Precautions: Fall Precaution Comments: hx of falls Restrictions Weight Bearing Restrictions: No    Mobility  Bed Mobility Overal bed mobility: Modified Independent             General bed mobility comments: Incr time and effort. Used rail and HOB elevated  Transfers Overall transfer level: Needs assistance Equipment used: Rolling walker (2 wheeled) Transfers: Sit to/from Stand Sit to Stand: Supervision         General transfer comment: supervision for safety  Ambulation/Gait Ambulation/Gait assistance: Supervision Gait Distance (Feet): 75 Feet Assistive device: Rolling walker (2 wheeled) Gait Pattern/deviations: Step-through pattern;Decreased stride length Gait velocity: decr Gait velocity interpretation: <1.8 ft/sec, indicate of risk for recurrent falls General Gait Details: supervision for lines and safety   Stairs             Wheelchair Mobility    Modified Rankin (Stroke Patients Only)       Balance  Overall balance assessment: Needs assistance Sitting-balance support: Feet supported;No upper extremity supported Sitting balance-Leahy Scale: Good     Standing balance support: During functional activity;Single extremity supported Standing balance-Leahy Scale: Poor Standing balance comment: UE support and supervision                            Cognition Arousal/Alertness: Awake/alert Behavior During Therapy: WFL for tasks assessed/performed Overall Cognitive Status: Within Functional Limits for tasks assessed                                        Exercises Other Exercises Other Exercises: Instructed in repeated sit to stand exercise for home. Instructed to start with 3 repititions and work up to 10 twice and day    General Comments General comments (skin integrity, edema, etc.): Pt on 3L of O2 with SpO2 >88% with activity      Pertinent Vitals/Pain Pain Assessment: No/denies pain    Home Living                      Prior Function            PT Goals (current goals can now be found in the care plan section) Progress towards PT goals: Progressing toward goals    Frequency    Min 3X/week      PT Plan      Co-evaluation  AM-PAC PT "6 Clicks" Mobility   Outcome Measure  Help needed turning from your back to your side while in a flat bed without using bedrails?: None Help needed moving from lying on your back to sitting on the side of a flat bed without using bedrails?: A Little Help needed moving to and from a bed to a chair (including a wheelchair)?: None Help needed standing up from a chair using your arms (e.g., wheelchair or bedside chair)?: None Help needed to walk in hospital room?: None Help needed climbing 3-5 steps with a railing? : A Little 6 Click Score: 22    End of Session Equipment Utilized During Treatment: Oxygen Activity Tolerance: Patient tolerated treatment well Patient left: with call  bell/phone within reach;in chair;with family/visitor present   PT Visit Diagnosis: Unsteadiness on feet (R26.81);Other abnormalities of gait and mobility (R26.89);Muscle weakness (generalized) (M62.81);History of falling (Z91.81);Difficulty in walking, not elsewhere classified (R26.2)     Time: 1540-1600 PT Time Calculation (min) (ACUTE ONLY): 20 min  Charges:  $Gait Training: 8-22 mins                     [place] Pager [phone] Office [place] 12/13/2019, 5:13 PM

## 2019-12-13 NOTE — Progress Notes (Signed)
PROGRESS NOTE    [name]  [id number] DOB: 17-Feb-1931 DOA: 12/10/2019 PCP: Hoyt Koch, MD   Brief Narrative:  HPI on 12/11/2019 by Dr. Jennette Kettle Amie LASHASTA CRUICKSHANK is a 86 y.o. female with medical history significant of dCHF, CKD stage 3, DM2, HTN, COPD.  Patient has had slightly increased SOB and BLE edema from baseline over the past couple of days.  Onset about 3 days ago.  Occasional cough.  No fevers, no CP.  Despite minimal symptoms, she took her pulse ox at home, and it was in the 70s, therefore she called EMS (actually called PCP first who told her to call EMS).  Per EMS on their arrival, her pulse ox was indeed in the 70s.  Patient brought to ED.  Interim history Admitted with acute hypoxic respiratory failure secondary to CHF exacerbation, currently on IV Lasix. Also being treated for UTI Assessment & Plan   Acute hypoxic respiratory failure secondary to decompensated diastolic heart failure -Patient presented with shortness of breath and bilateral lower extremity edema -Seems the patient's pulse ox was reading approximately 70s, when she arrived via EMS, oxygen saturations were in the 70s, patient was placed on nonrebreather. -BNP 531 -Continue IV Lasix -Echocardiogram shows an EF of 60 to 65%, elevated LV end-diastolic pressure, LV diastolic parameters consistent with grade 1 diastolic dysfunction -Monitor intake and output, daily weights- output was not accurately collected due to purewick malfunction- catheter placed for accurate output -Will attempt to wean oxygen as possible  Cystitis/UTI -UA showed many bacteria,>50 WBC, large leukocytes -Urine culture >100K Ecoli, pending sensitivities  -Continue ceftriaxone  Chronic kidney disease, stage IIIb -Reactive and stable, continue to monitor closely given diuresis  Essential hypertension -Stable, continue amlodipine, hydralazine  Diabetes mellitus, type II -Continue Lantus, insulin sliding scale,  3u with meals, CBG monitoring  Peripheral neuropathy -Likely secondary to diabetes -Continue gabapentin  Obesity -BMI of 34 -Patient to follow-up with PCP to discuss lifestyle modifications  DVT Prophylaxis Lovenox  Code Status: DNR  Family Communication: None at bedside. Left message for son x 2.  Disposition Plan: Admitted from home for acute CHF exacerbation with hypoxic respiratory failure.  Currently on IV Lasix.  Treating UTI. Disposition likely home with home health in 1 to 2 days.  Consultants None  Procedures  Echocardiogram  Antibiotics   Anti-infectives (From admission, onward)   Start     Dose/Rate Route Frequency Ordered Stop   12/11/19 0145  cefTRIAXone (ROCEPHIN) 1 g in sodium chloride 0.9 % 100 mL IVPB     1 g 200 mL/hr over 30 Minutes Intravenous Daily 12/11/19 0137 12/13/19 0631      Subjective:   Jennie Montesano seen and examined today.  Feels breathing has mildly improved but not back to her baseline.  Patient states she does not wear oxygen at home.  Feels her leg swelling has also gotten better.  Denies current chest pain, abdominal pain, nausea or vomiting, diarrhea or constipation, dizziness or headache.  Objective:   Vitals:   12/12/19 0740 12/12/19 2253 12/13/19 0429 12/13/19 0758  BP: (!) 138/54 (!) 140/51  (!) 141/54  Pulse: 61 62  68  Resp: 20 18    Temp: 98.5 F (36.9 C) 98.5 F (36.9 C)  97.7 F (36.5 C)  TempSrc: Oral Oral  Oral  SpO2: 96% 96%  93%  Weight:   91.2 kg   Height:        Intake/Output Summary (Last 24 hours) at 12/13/2019 [id number]  Last data filed at 12/13/2019 0500 Gross per 24 hour  Intake 3 ml  Output 500 ml  Net -497 ml   Filed Weights   12/10/19 2135 12/12/19 0353 12/13/19 0429  Weight: 87.5 kg 91.2 kg 91.2 kg   Exam  General: Well developed, well nourished, NAD, appears stated age  70: NCAT, mucous membranes moist.   Cardiovascular: S1 S2 auscultated, RRR  Respiratory: Clear to auscultation bilaterally     Abdomen: Soft, nontender, nondistended, + bowel sounds  Extremities: warm dry without cyanosis clubbing. B/L LE edema improving (not further edema in upper calf)  Neuro: AAOx3, nonfocal  Psych: Pleasant, appropriate mood and affect  Data Reviewed: I have personally reviewed following labs and imaging studies  CBC: Recent Labs  Lab 12/10/19 2140  WBC 11.0*  NEUTROABS 8.8*  HGB 11.9*  HCT 40.4  MCV 92.2  PLT 0000000   Basic Metabolic Panel: Recent Labs  Lab 12/10/19 2140 12/11/19 0955 12/12/19 0533 12/13/19 0219  NA 142 143 142 141  K 4.2 4.2 4.8 4.3  CL 105 102 103 96*  CO2 27 30 28  32  GLUCOSE 167* 185* 118* 104*  BUN 35* 33* 37* 35*  CREATININE 1.27* 1.25* 1.39* 1.27*  CALCIUM 8.9 8.7* 8.7* 8.9   GFR: Estimated Creatinine Clearance: 34.8 mL/min (A) (by C-G formula based on SCr of 1.27 mg/dL (H)). Liver Function Tests: Recent Labs  Lab 12/10/19 2140  AST 23  ALT 24  ALKPHOS 57  BILITOT 0.7  PROT 5.8*  ALBUMIN 3.1*   No results for input(s): LIPASE, AMYLASE in the last 168 hours. No results for input(s): AMMONIA in the last 168 hours. Coagulation Profile: No results for input(s): INR, PROTIME in the last 168 hours. Cardiac Enzymes: No results for input(s): CKTOTAL, CKMB, CKMBINDEX, TROPONINI in the last 168 hours. BNP (last 3 results) No results for input(s): PROBNP in the last 8760 hours. HbA1C: Recent Labs    12/11/19 0955  HGBA1C 6.7*   CBG: Recent Labs  Lab 12/12/19 0743 12/12/19 1232 12/12/19 1639 12/12/19 2118 12/13/19 0805  GLUCAP 109* 123* 182* 160* 74   Lipid Profile: No results for input(s): CHOL, HDL, LDLCALC, TRIG, CHOLHDL, LDLDIRECT in the last 72 hours. Thyroid Function Tests: No results for input(s): TSH, T4TOTAL, FREET4, T3FREE, THYROIDAB in the last 72 hours. Anemia Panel: No results for input(s): VITAMINB12, FOLATE, FERRITIN, TIBC, IRON, RETICCTPCT in the last 72 hours. Urine analysis:    Component Value Date/Time    COLORURINE YELLOW 12/11/2019 0120   APPEARANCEUR CLOUDY (A) 12/11/2019 0120   LABSPEC 1.014 12/11/2019 0120   PHURINE 5.0 12/11/2019 0120   GLUCOSEU NEGATIVE 12/11/2019 0120   HGBUR SMALL (A) 12/11/2019 0120   BILIRUBINUR NEGATIVE 12/11/2019 0120   BILIRUBINUR Neg 08/06/2018 1540   KETONESUR NEGATIVE 12/11/2019 0120   PROTEINUR NEGATIVE 12/11/2019 0120   UROBILINOGEN 0.2 08/06/2018 1540   UROBILINOGEN Normal 07/18/2017 0000   NITRITE NEGATIVE 12/11/2019 0120   LEUKOCYTESUR LARGE (A) 12/11/2019 0120   Sepsis Labs: @LABRCNTIP (procalcitonin:4,lacticidven:4)  ) Recent Results (from the past 240 hour(s))  Respiratory Panel by RT PCR (Flu A&B, Covid) - Nasopharyngeal Swab     Status: None   Collection Time: 12/10/19 11:00 PM   Specimen: Nasopharyngeal Swab  Result Value Ref Range Status   SARS Coronavirus 2 by RT PCR NEGATIVE NEGATIVE Final    Comment: (NOTE) SARS-CoV-2 target nucleic acids are NOT DETECTED. The SARS-CoV-2 RNA is generally detectable in upper respiratoy specimens during the acute phase of infection.  The lowest concentration of SARS-CoV-2 viral copies this assay can detect is 131 copies/mL. A negative result does not preclude SARS-Cov-2 infection and should not be used as the sole basis for treatment or other patient management decisions. A negative result may occur with  improper specimen collection/handling, submission of specimen other than nasopharyngeal swab, presence of viral mutation(s) within the areas targeted by this assay, and inadequate number of viral copies (<131 copies/mL). A negative result must be combined with clinical observations, patient history, and epidemiological information. The expected result is Negative. Fact Sheet for Patients:  PinkCheek.be Fact Sheet for Healthcare Providers:  GravelBags.it This test is not yet ap proved or cleared by the [place] FDA and  has been  authorized for detection and/or diagnosis of SARS-CoV-2 by FDA under an Emergency Use Authorization (EUA). This EUA will remain  in effect (meaning this test can be used) for the duration of the COVID-19 declaration under Section 564(b)(1) of the Act, 21 U.S.C. section 360bbb-3(b)(1), unless the authorization is terminated or revoked sooner.    Influenza A by PCR NEGATIVE NEGATIVE Final   Influenza B by PCR NEGATIVE NEGATIVE Final    Comment: (NOTE) The Xpert Xpress SARS-CoV-2/FLU/RSV assay is intended as an aid in  the diagnosis of influenza from Nasopharyngeal swab specimens and  should not be used as a sole basis for treatment. Nasal washings and  aspirates are unacceptable for Xpert Xpress SARS-CoV-2/FLU/RSV  testing. Fact Sheet for Patients: PinkCheek.be Fact Sheet for Healthcare Providers: GravelBags.it This test is not yet approved or cleared by the [place] FDA and  has been authorized for detection and/or diagnosis of SARS-CoV-2 by  FDA under an Emergency Use Authorization (EUA). This EUA will remain  in effect (meaning this test can be used) for the duration of the  Covid-19 declaration under Section 564(b)(1) of the Act, 21  U.S.C. section 360bbb-3(b)(1), unless the authorization is  terminated or revoked. Performed at [facility], [place] [address] [place], [place] [address]   Culture, Urine     Status: Abnormal (Preliminary result)   Collection Time: 12/11/19  1:38 AM   Specimen: Urine, Random  Result Value Ref Range Status   Specimen Description URINE, RANDOM  Final   Special Requests NONE  Final   Culture (A)  Final    >=100,000 COLONIES/mL ESCHERICHIA COLI SUSCEPTIBILITIES TO FOLLOW Performed at [facility], [place] [address], [place], [place] [address]    Report Status PENDING  Incomplete      Radiology Studies: ECHOCARDIOGRAM COMPLETE  Result Date: 12/11/2019   ECHOCARDIOGRAM REPORT    Patient Name:   EMMILYN REVERON [facility] Date of Exam: 12/11/2019 Medical Rec #:  [id number]    Height:       64.0 in Accession #:    [id number]   Weight:       193.0 lb Date of Birth:  09-Feb-1931     BSA:          1.93 m Patient Age:    41 years     BP:           153/62 mmHg Patient Gender: F            HR:           76 bpm. Exam Location:  Inpatient Procedure: 2D Echo and Intracardiac Opacification Agent Indications:    CHF - Acute Diastolic  History:        Patient has prior history of Echocardiogram examinations, most  recent 09/21/2019. COPD; Risk Factors:Diabetes and Hypertension.                 CKD.  Sonographer:    Clayton Lefort RDCS (AE) Referring Phys: [place]  1. Left ventricular ejection fraction, by visual estimation, is 60 to 65%. The left ventricle has normal function. There is moderately increased left ventricular hypertrophy.  2. Definity contrast agent was given IV to delineate the left ventricular endocardial borders.  3. Elevated left ventricular end-diastolic pressure.  4. Left ventricular diastolic parameters are consistent with Grade I diastolic dysfunction (impaired relaxation).  5. The left ventricle has no regional wall motion abnormalities.  6. Global right ventricle has normal systolic function.The right ventricular size is mildly enlarged. Right vetricular wall thickness was not assessed.  7. Left atrial size was severely dilated.  8. Right atrial size was mildly dilated.  9. Mild to moderate mitral annular calcification. 10. The mitral valve is grossly normal. Trivial mitral valve regurgitation. 11. The tricuspid valve is grossly normal. 12. The tricuspid valve is grossly normal. Tricuspid valve regurgitation is mild. 13. The aortic valve is tricuspid. Aortic valve regurgitation is not visualized. Mild aortic valve sclerosis without stenosis. 14. The pulmonic valve was grossly normal. Pulmonic valve regurgitation is not visualized. 15. Moderately elevated  pulmonary artery systolic pressure. 16. The inferior vena cava is normal in size with greater than 50% respiratory variability, suggesting right atrial pressure of 3 mmHg. 17. The interatrial septum was not well visualized. FINDINGS  Left Ventricle: Left ventricular ejection fraction, by visual estimation, is 60 to 65%. The left ventricle has normal function. Definity contrast agent was given IV to delineate the left ventricular endocardial borders. The left ventricle has no regional wall motion abnormalities. The left ventricular internal cavity size was the left ventricle is normal in size. There is moderately increased left ventricular hypertrophy. Concentric left ventricular hypertrophy. Left ventricular diastolic parameters are consistent with Grade I diastolic dysfunction (impaired relaxation). Elevated left ventricular end-diastolic pressure. Right Ventricle: The right ventricular size is mildly enlarged. Right vetricular wall thickness was not assessed. Global RV systolic function is has normal systolic function. The tricuspid regurgitant velocity is 3.81 m/s, and with an assumed right atrial pressure of 3 mmHg, the estimated right ventricular systolic pressure is moderately elevated at 61.1 mmHg. Left Atrium: Left atrial size was severely dilated. Right Atrium: Right atrial size was mildly dilated Pericardium: There is no evidence of pericardial effusion. Mitral Valve: The mitral valve is grossly normal. Mild to moderate mitral annular calcification. Trivial mitral valve regurgitation. Tricuspid Valve: The tricuspid valve is grossly normal. Tricuspid valve regurgitation is mild. Aortic Valve: The aortic valve is tricuspid. . There is mild thickening of the aortic valve. Aortic valve regurgitation is not visualized. Mild aortic valve sclerosis is present, with no evidence of aortic valve stenosis. There is mild thickening of the aortic valve. Aortic valve mean gradient measures 7.0 mmHg. Aortic valve peak  gradient measures 11.2 mmHg. Aortic valve area, by VTI measures 2.32 cm. Pulmonic Valve: The pulmonic valve was grossly normal. Pulmonic valve regurgitation is not visualized. Pulmonic regurgitation is not visualized. Aorta: The aortic root is normal in size and structure. Venous: The inferior vena cava is normal in size with greater than 50% respiratory variability, suggesting right atrial pressure of 3 mmHg. IAS/Shunts: The interatrial septum was not well visualized.  LEFT VENTRICLE PLAX 2D LVIDd:         4.30 cm  Diastology LVIDs:  2.60 cm  LV e' lateral:   8.81 cm/s LV PW:         1.40 cm  LV E/e' lateral: 11.5 LV IVS:        1.60 cm  LV e' medial:    5.98 cm/s LVOT diam:     1.90 cm  LV E/e' medial:  16.9 LV SV:         58 ml LV SV Index:   28.90 LVOT Area:     2.84 cm  RIGHT VENTRICLE             IVC RV Basal diam:  3.10 cm     IVC diam: 1.60 cm RV S prime:     13.40 cm/s TAPSE (M-mode): 3.7 cm LEFT ATRIUM             Index       RIGHT ATRIUM           Index LA diam:        4.00 cm 2.08 cm/m  RA Area:     16.30 cm LA Vol (A2C):   95.5 ml 49.56 ml/m RA Volume:   42.90 ml  22.26 ml/m LA Vol (A4C):   94.7 ml 49.14 ml/m LA Biplane Vol: 97.0 ml 50.34 ml/m  AORTIC VALVE AV Area (Vmax):    2.16 cm AV Area (Vmean):   2.08 cm AV Area (VTI):     2.32 cm AV Vmax:           167.00 cm/s AV Vmean:          124.000 cm/s AV VTI:            0.374 m AV Peak Grad:      11.2 mmHg AV Mean Grad:      7.0 mmHg LVOT Vmax:         127.00 cm/s LVOT Vmean:        91.100 cm/s LVOT VTI:          0.306 m LVOT/AV VTI ratio: 0.82  AORTA Ao Root diam: 2.80 cm MITRAL VALVE                         TRICUSPID VALVE MV Area (PHT): 2.45 cm              TR Peak grad:   58.1 mmHg MV PHT:        89.90 msec            TR Vmax:        381.00 cm/s MV Decel Time: 310 msec MV E velocity: 101.00 cm/s 103 cm/s  SHUNTS MV A velocity: 126.00 cm/s 70.3 cm/s Systemic VTI:  0.31 m MV E/A ratio:  0.80        1.5       Systemic Diam: 1.90 cm   Kate Sable MD Electronically signed by Kate Sable MD Signature Date/Time: 12/11/2019/1:51:52 PM    Final      Scheduled Meds: . amLODipine  5 mg Oral Daily  . aspirin EC  81 mg Oral q morning - 10a  . atorvastatin  40 mg Oral q1800  . enoxaparin (LOVENOX) injection  40 mg Subcutaneous QHS  . feeding supplement (GLUCERNA SHAKE)  237 mL Oral BID BM  . fluticasone  2 spray Each Nare Daily  . furosemide  60 mg Intravenous BID  . gabapentin  600 mg Oral QHS  . hydrALAZINE  25 mg Oral BID  .  insulin aspart  0-9 Units Subcutaneous TID WC  . insulin aspart  3 Units Subcutaneous TID WC  . insulin glargine  15 Units Subcutaneous QHS  . metoprolol tartrate  25 mg Oral BID  . multivitamin with minerals  1 tablet Oral Daily  . polyethylene glycol  17 g Oral BID  . sodium chloride flush  3 mL Intravenous Q12H   Continuous Infusions: . sodium chloride       LOS: 2 days   Time Spent in minutes   45 minutes  Rabab Currington D.O. on 12/13/2019 at 8:43 AM  Between 7am to 7pm - Please see pager noted on [email or web address]  After 7pm go to [email or web address]  And look for the night coverage person covering for me after hours  [facility] Office  [phone]

## 2019-12-14 LAB — GLUCOSE, CAPILLARY
Glucose-Capillary: 134 mg/dL — ABNORMAL HIGH (ref 70–99)
Glucose-Capillary: 178 mg/dL — ABNORMAL HIGH (ref 70–99)
Glucose-Capillary: 205 mg/dL — ABNORMAL HIGH (ref 70–99)
Glucose-Capillary: 142 mg/dL — ABNORMAL HIGH (ref 70–99)

## 2019-12-14 LAB — BASIC METABOLIC PANEL
Anion gap: 11 (ref 5–15)
BUN: 28 mg/dL — ABNORMAL HIGH (ref 8–23)
CO2: 35 mmol/L — ABNORMAL HIGH (ref 22–32)
Calcium: 8.9 mg/dL (ref 8.9–10.3)
Chloride: 97 mmol/L — ABNORMAL LOW (ref 98–111)
Creatinine, Ser: 1.11 mg/dL — ABNORMAL HIGH (ref 0.44–1.00)
GFR calc Af Amer: 52 mL/min — ABNORMAL LOW (ref >60)
GFR calc non Af Amer: 45 mL/min — ABNORMAL LOW (ref >60)
Glucose, Bld: 145 mg/dL — ABNORMAL HIGH (ref 70–99)
Potassium: 4.2 mmol/L (ref 3.5–5.1)
Sodium: 143 mmol/L (ref 135–145)

## 2019-12-14 LAB — CBC
HCT: 37 % (ref 36.0–46.0)
Hemoglobin: 10.9 g/dL — ABNORMAL LOW (ref 12.0–15.0)
MCH: 27 pg (ref 26.0–34.0)
MCHC: 29.5 g/dL — ABNORMAL LOW (ref 30.0–36.0)
MCV: 91.8 fL (ref 80.0–100.0)
Platelets: 263 10*3/uL (ref 150–400)
RBC: 4.03 MIL/uL (ref 3.87–5.11)
RDW: 15.4 % (ref 11.5–15.5)
WBC: 8.3 10*3/uL (ref 4.0–10.5)
nRBC: 0 % (ref 0.0–0.2)

## 2019-12-14 NOTE — TOC Initial Note (Signed)
Transition of Care [facility]) - Initial/Assessment Note    Patient Details  Name: BURNICE KURZAWA MRN: [id number] Date of Birth: 02/09/1931  Transition of Care [facility]) CM/SW Contact:    Carles Collet, RN Phone Number: 12/14/2019, 2:16 PM  Clinical Narrative:                 Patient lives at home w son. He assumes all care for her. She has all needed DME at home. She has a handicap accessible bathroom. She weighs herself daily.  Her and her son at bedside expressed no concerns with barriers to care after discharge. He will transport her home.  Currently on supplemental O2, would need to watch for home O2 needs.  They decline HH services.     Expected Discharge Plan: Home/Self Care Barriers to Discharge: Continued Medical Work up   Patient Goals and CMS Choice Patient states their goals for this hospitalization and ongoing recovery are:: to go home      Expected Discharge Plan and Services Expected Discharge Plan: Home/Self Care                                              Prior Living Arrangements/Services                       Activities of Daily Living Home Assistive Devices/Equipment: Environmental consultant (specify type) ADL Screening (condition at time of admission) Patient's cognitive ability adequate to safely complete daily activities?: Yes Is the patient deaf or have difficulty hearing?: Yes Does the patient have difficulty seeing, even when wearing glasses/contacts?: No Does the patient have difficulty concentrating, remembering, or making decisions?: No Patient able to express need for assistance with ADLs?: Yes Does the patient have difficulty dressing or bathing?: No Independently performs ADLs?: Yes (appropriate for developmental age) Does the patient have difficulty walking or climbing stairs?: Yes Weakness of Legs: Both Weakness of Arms/Hands: None  Permission Sought/Granted                  Emotional Assessment              Admission  diagnosis:  Hypoxia [R09.02] Acute respiratory failure with hypoxia ([place]) [J96.01] Patient Active Problem List   Diagnosis Date Noted  . Dyspnea 12/10/2019  . Hypoglycemia due to insulin 09/22/2019  . Pressure injury of skin stage II to buttocks 09/21/2019  . Benign hypertension with CKD (chronic kidney disease) stage III 09/21/2019  . Obesity (BMI 30-39.9) 09/21/2019  . Acute lower UTI 09/21/2019  . Acute kidney injury superimposed on CKD ([place]) 09/20/2019  . Acute metabolic encephalopathy [id number]  . Anemia of chronic renal failure, stage 2 (mild) 02/18/2017  . Diabetic polyneuropathy associated with type 2 diabetes mellitus ([place]) 02/18/2017  . Vitamin D deficiency 02/18/2017  . Acute on chronic diastolic CHF (congestive heart failure) ([place])   . Pulmonary hypertension ([place])   . CKD (chronic kidney disease) stage 3, GFR 30-59 ml/min   . Acute respiratory failure with hypoxia ([place])   . Hypertensive retinopathy of both eyes 09/26/2015  . Pseudophakia of both eyes 09/26/2015  . Uncontrolled diabetes mellitus ([place]) 06/07/2014  . Chronic diastolic CHF (congestive heart failure) ([place]) 03/18/2011  . Mixed dyslipidemia 03/18/2011  . Hypertension, essential    PCP:  Hoyt Koch, MD Pharmacy:   [facility] [place], [place] -  [id number] [place] [id number] Phone: [phone] Fax: [phone]     Social Determinants of Health (SDOH) Interventions    Readmission Risk Interventions No flowsheet data found.

## 2019-12-14 NOTE — Consult Note (Signed)
   [facility] CM Inpatient Consult   12/14/2019  [name] Dec 22, 1930 [id number]  Patient screened for high risk score for unplanned readmission to check if potential [place] [facility] services in the [facility] Organization are needed.  Review of patient's medical record reveals patient is being recommended for home with home health. Notes reflecting declining home health at this time.  Primary Care Provider is Pricilla Holm, MD, [facility].   Plan: Continue to follow progress and disposition to assess for post hospital care management needs.   Will reach out to inpatient [facility] RNCM to assist with assessment of care management needs.  Please place a [facility] Care Management consult as appropriate and for questions contact:   Natividad Brood, RN BSN [facility] Liaison  [phone] business mobile phone Toll free office [phone]  Fax number: [phone] [email or web address]

## 2019-12-14 NOTE — Progress Notes (Signed)
PROGRESS NOTE    [name]  [id number] DOB: July 02, 1931 DOA: 12/10/2019 PCP: Hoyt Koch, MD   Brief Narrative:  HPI on 12/11/2019 by Dr. Jennette Kettle [name] is a 86 y.o. female with medical history significant of dCHF, CKD stage 3, DM2, HTN, COPD.  Patient has had slightly increased SOB and BLE edema from baseline over the past couple of days.  Onset about 3 days ago.  Occasional cough.  No fevers, no CP.  Despite minimal symptoms, she took her pulse ox at home, and it was in the 70s, therefore she called EMS (actually called PCP first who told her to call EMS).  Per EMS on their arrival, her pulse ox was indeed in the 70s.  Patient brought to ED.  Interim history Admitted with acute hypoxic respiratory failure secondary to CHF exacerbation, currently on IV Lasix. Also being treated for UTI Assessment & Plan   Acute hypoxic respiratory failure secondary to decompensated diastolic heart failure -Patient presented with shortness of breath and bilateral lower extremity edema -Seems the patient's pulse ox was reading approximately 70s, when she arrived via EMS, oxygen saturations were in the 70s, patient was placed on nonrebreather. -BNP 531 -Continue IV Lasix -Echocardiogram shows an EF of 60 to 65%, elevated LV end-diastolic pressure, LV diastolic parameters consistent with grade 1 diastolic dysfunction -Monitor intake and output, daily weights- output was not accurately collected due to purewick malfunction- catheter placed for accurate output. UOP over past 24 hours 1475cc -Will attempt to wean oxygen as possible- have discussed with RN  Cystitis/UTI -UA showed many bacteria,>50 WBC, large leukocytes -Urine culture >100K Ecoli, pansensitive  -was placed on ceftriaxone, completed 3 doses  Chronic kidney disease, stage IIIb -Creatinine stable, continue to monitor closely given diuresis  Essential hypertension -Stable, continue amlodipine,  hydralazine  Diabetes mellitus, type II -Continue Lantus, insulin sliding scale, 3u with meals, CBG monitoring  Peripheral neuropathy -Likely secondary to diabetes -Continue gabapentin  Obesity -BMI of 34 -Patient to follow-up with PCP to discuss lifestyle modifications  DVT Prophylaxis Lovenox  Code Status: DNR  Family Communication: None at bedside. Left message for son x 2.  Disposition Plan: Admitted from home for acute CHF exacerbation with hypoxic respiratory failure.  Currently on IV Lasix.  Disposition home in 1 to 2 days.  Consultants None  Procedures  Echocardiogram  Antibiotics   Anti-infectives (From admission, onward)   Start     Dose/Rate Route Frequency Ordered Stop   12/11/19 0145  cefTRIAXone (ROCEPHIN) 1 g in sodium chloride 0.9 % 100 mL IVPB     1 g 200 mL/hr over 30 Minutes Intravenous Daily 12/11/19 0137 12/13/19 2106      Subjective:   [name] seen and examined today.  His breathing has improved but continues to need oxygen.  Does not wear oxygen at home.  Feels her leg swelling has improved.  Denies current chest pain, abdominal pain, nausea or vomiting, diarrhea or constipation, dizziness or headache.   Objective:   Vitals:   12/13/19 0758 12/13/19 1533 12/13/19 2332 12/14/19 0828  BP: (!) 141/54 (!) 151/85 (!) 141/57 (!) 145/50  Pulse: 68 69 61 63  Resp:    17  Temp: 97.7 F (36.5 C) 98.4 F (36.9 C) 98.2 F (36.8 C) 98.2 F (36.8 C)  TempSrc: Oral Oral Oral Oral  SpO2: 93% 96% 99% 95%  Weight:      Height:        Intake/Output Summary (Last 24  hours) at 12/14/2019 [id number] Last data filed at 12/14/2019 0117 Gross per 24 hour  Intake 480 ml  Output 1475 ml  Net -995 ml   Filed Weights   12/10/19 2135 12/12/19 0353 12/13/19 0429  Weight: 87.5 kg 91.2 kg 91.2 kg   Exam  General: Well developed, well nourished, NAD, appears stated age  HEENT: NCAT,  mucous membranes moist.   Cardiovascular: S1 S2 auscultated,  RRR  Respiratory: Diminished but clear  Abdomen: Soft, obese, nontender, nondistended, + bowel sounds  Extremities: warm dry without cyanosis clubbing. B/L LE edema improving down to mid calf  Neuro: AAOx2, nonfocal  Psych: Appropriate mood and affect, pleasant   Data Reviewed: I have personally reviewed following labs and imaging studies  CBC: Recent Labs  Lab 12/10/19 2140 12/14/19 0317  WBC 11.0* 8.3  NEUTROABS 8.8*  --   HGB 11.9* 10.9*  HCT 40.4 37.0  MCV 92.2 91.8  PLT 261 [id number]   Basic Metabolic Panel: Recent Labs  Lab 12/10/19 2140 12/11/19 0955 12/12/19 0533 12/13/19 0219 12/14/19 0317  NA 142 143 142 141 143  K 4.2 4.2 4.8 4.3 4.2  CL 105 102 103 96* 97*  CO2 27 30 28  32 35*  GLUCOSE 167* 185* 118* 104* 145*  BUN 35* 33* 37* 35* 28*  CREATININE 1.27* 1.25* 1.39* 1.27* 1.11*  CALCIUM 8.9 8.7* 8.7* 8.9 8.9   GFR: Estimated Creatinine Clearance: 39.8 mL/min (A) (by C-G formula based on SCr of 1.11 mg/dL (H)). Liver Function Tests: Recent Labs  Lab 12/10/19 2140  AST 23  ALT 24  ALKPHOS 57  BILITOT 0.7  PROT 5.8*  ALBUMIN 3.1*   No results for input(s): LIPASE, AMYLASE in the last 168 hours. No results for input(s): AMMONIA in the last 168 hours. Coagulation Profile: No results for input(s): INR, PROTIME in the last 168 hours. Cardiac Enzymes: No results for input(s): CKTOTAL, CKMB, CKMBINDEX, TROPONINI in the last 168 hours. BNP (last 3 results) No results for input(s): PROBNP in the last 8760 hours. HbA1C: Recent Labs    12/11/19 0955  HGBA1C 6.7*   CBG: Recent Labs  Lab 12/13/19 0805 12/13/19 1124 12/13/19 1537 12/13/19 2108 12/14/19 0826  GLUCAP 74 180* 241* 218* 134*   Lipid Profile: No results for input(s): CHOL, HDL, LDLCALC, TRIG, CHOLHDL, LDLDIRECT in the last 72 hours. Thyroid Function Tests: No results for input(s): TSH, T4TOTAL, FREET4, T3FREE, THYROIDAB in the last 72 hours. Anemia Panel: No results for input(s):  VITAMINB12, FOLATE, FERRITIN, TIBC, IRON, RETICCTPCT in the last 72 hours. Urine analysis:    Component Value Date/Time   COLORURINE YELLOW 12/11/2019 0120   APPEARANCEUR CLOUDY (A) 12/11/2019 0120   LABSPEC 1.014 12/11/2019 0120   PHURINE 5.0 12/11/2019 0120   GLUCOSEU NEGATIVE 12/11/2019 0120   HGBUR SMALL (A) 12/11/2019 0120   BILIRUBINUR NEGATIVE 12/11/2019 0120   BILIRUBINUR Neg 08/06/2018 1540   KETONESUR NEGATIVE 12/11/2019 0120   PROTEINUR NEGATIVE 12/11/2019 0120   UROBILINOGEN 0.2 08/06/2018 1540   UROBILINOGEN Normal 07/18/2017 0000   NITRITE NEGATIVE 12/11/2019 0120   LEUKOCYTESUR LARGE (A) 12/11/2019 0120   Sepsis Labs: @LABRCNTIP (procalcitonin:4,lacticidven:4)  ) Recent Results (from the past 240 hour(s))  Respiratory Panel by RT PCR (Flu A&B, Covid) - Nasopharyngeal Swab     Status: None   Collection Time: 12/10/19 11:00 PM   Specimen: Nasopharyngeal Swab  Result Value Ref Range Status   SARS Coronavirus 2 by RT PCR NEGATIVE NEGATIVE Final    Comment: (NOTE) SARS-CoV-2  target nucleic acids are NOT DETECTED. The SARS-CoV-2 RNA is generally detectable in upper respiratoy specimens during the acute phase of infection. The lowest concentration of SARS-CoV-2 viral copies this assay can detect is 131 copies/mL. A negative result does not preclude SARS-Cov-2 infection and should not be used as the sole basis for treatment or other patient management decisions. A negative result may occur with  improper specimen collection/handling, submission of specimen other than nasopharyngeal swab, presence of viral mutation(s) within the areas targeted by this assay, and inadequate number of viral copies (<131 copies/mL). A negative result must be combined with clinical observations, patient history, and epidemiological information. The expected result is Negative. Fact Sheet for Patients:  PinkCheek.be Fact Sheet for Healthcare Providers:   GravelBags.it This test is not yet ap proved or cleared by the [place] FDA and  has been authorized for detection and/or diagnosis of SARS-CoV-2 by FDA under an Emergency Use Authorization (EUA). This EUA will remain  in effect (meaning this test can be used) for the duration of the COVID-19 declaration under Section 564(b)(1) of the Act, 21 U.S.C. section 360bbb-3(b)(1), unless the authorization is terminated or revoked sooner.    Influenza A by PCR NEGATIVE NEGATIVE Final   Influenza B by PCR NEGATIVE NEGATIVE Final    Comment: (NOTE) The Xpert Xpress SARS-CoV-2/FLU/RSV assay is intended as an aid in  the diagnosis of influenza from Nasopharyngeal swab specimens and  should not be used as a sole basis for treatment. Nasal washings and  aspirates are unacceptable for Xpert Xpress SARS-CoV-2/FLU/RSV  testing. Fact Sheet for Patients: PinkCheek.be Fact Sheet for Healthcare Providers: GravelBags.it This test is not yet approved or cleared by the [place] FDA and  has been authorized for detection and/or diagnosis of SARS-CoV-2 by  FDA under an Emergency Use Authorization (EUA). This EUA will remain  in effect (meaning this test can be used) for the duration of the  Covid-19 declaration under Section 564(b)(1) of the Act, 21  U.S.C. section 360bbb-3(b)(1), unless the authorization is  terminated or revoked. Performed at [facility], [place] [address] [place], [place] [address]   Culture, Urine     Status: Abnormal   Collection Time: 12/11/19  1:38 AM   Specimen: Urine, Random  Result Value Ref Range Status   Specimen Description URINE, RANDOM  Final   Special Requests   Final    NONE Performed at [facility], [place] [address] [place],  [address]    Culture >=100,000 COLONIES/mL ESCHERICHIA COLI (A)  Final   Report Status 12/13/2019 FINAL  Final   Organism ID,  Bacteria ESCHERICHIA COLI (A)  Final      Susceptibility   Escherichia coli - MIC*    AMPICILLIN <=2 SENSITIVE Sensitive     CEFAZOLIN <=4 SENSITIVE Sensitive     CEFTRIAXONE <=0.25 SENSITIVE Sensitive     CIPROFLOXACIN <=0.25 SENSITIVE Sensitive     GENTAMICIN <=1 SENSITIVE Sensitive     IMIPENEM <=0.25 SENSITIVE Sensitive     NITROFURANTOIN <=16 SENSITIVE Sensitive     TRIMETH/SULFA <=20 SENSITIVE Sensitive     AMPICILLIN/SULBACTAM <=2 SENSITIVE Sensitive     PIP/TAZO <=4 SENSITIVE Sensitive     * >=100,000 COLONIES/mL ESCHERICHIA COLI      Radiology Studies: No results found.   Scheduled Meds: . amLODipine  5 mg Oral Daily  . aspirin EC  81 mg Oral q morning - 10a  . atorvastatin  40 mg Oral q1800  .  enoxaparin (LOVENOX) injection  40 mg Subcutaneous QHS  . feeding supplement (GLUCERNA SHAKE)  237 mL Oral BID BM  . fluticasone  2 spray Each Nare Daily  . furosemide  60 mg Intravenous BID  . gabapentin  600 mg Oral QHS  . hydrALAZINE  25 mg Oral BID  . insulin aspart  0-9 Units Subcutaneous TID WC  . insulin aspart  3 Units Subcutaneous TID WC  . insulin glargine  15 Units Subcutaneous QHS  . metoprolol tartrate  25 mg Oral BID  . multivitamin with minerals  1 tablet Oral Daily  . polyethylene glycol  17 g Oral BID  . sodium chloride flush  3 mL Intravenous Q12H   Continuous Infusions: . sodium chloride       LOS: 3 days   Time Spent in minutes   30 minutes  Dryden Tapley D.O. on 12/14/2019 at 8:39 AM  Between 7am to 7pm - Please see pager noted on [email or web address]  After 7pm go to [email or web address]  And look for the night coverage person covering for me after hours  [facility] Office  [phone]

## 2019-12-14 NOTE — Progress Notes (Signed)
Physical Therapy Treatment Patient Details Name: [name] MRN: [id number] DOB: [birth year] Today's Date: 12/14/2019    History of Present Illness Pt is an 86 y.o. female admitted 12/10/19 with worsenign SOB and BLE edema, also hypoxic. Worked up for acute hypoxic respiratory failure secondary to decompensated HF. PMH includes dCHF, CKD 3, COPD, HTN.   PT Comments    Pt progressing well with mobility. Able to increase ambulation distance with RW requiring 3L O2 to maintain SpO2 >/88% (see saturations qualification note). Pt remains limited by generalized weakness and decreased activity tolerance. Son present and supportive; educ re: therex/activity recommendations, energy conservation, fall risk reduction and importance of continued mobility. Pt hopeful for d/c home tomorrow.    Follow Up Recommendations  No PT follow up;Supervision for mobility/OOB(declined HHPT)     Equipment Recommendations  None recommended by PT    Recommendations for Other Services       Precautions / Restrictions Precautions Precautions: Fall Precaution Comments: hx of falls Restrictions Weight Bearing Restrictions: No    Mobility  Bed Mobility               General bed mobility comments: Received sitting in recliner  Transfers Overall transfer level: Modified independent Equipment used: Rolling walker (2 wheeled) Transfers: Sit to/from Stand           General transfer comment: Good technique without cues; mod indep performing multiple sit<>stands to RW from recliner  Ambulation/Gait Ambulation/Gait assistance: Supervision Gait Distance (Feet): 170 Feet Assistive device: Rolling walker (2 wheeled) Gait Pattern/deviations: Step-through pattern;Decreased stride length   Gait velocity interpretation: 1.31 - 2.62 ft/sec, indicative of limited community ambulator General Gait Details: Slow, steady gait with RW, frequent cues to maintain closer proximity to RW; increased speed towards end due  to fatigue/SOB and pt wanting to sit down. Pt requiring 3L O2 to maintain SpO2 93%   Stairs             Wheelchair Mobility    Modified Rankin (Stroke Patients Only)       Balance Overall balance assessment: Needs assistance Sitting-balance support: Feet supported;No upper extremity supported Sitting balance-Leahy Scale: Good     Standing balance support: During functional activity;Single extremity supported Standing balance-Leahy Scale: Poor Standing balance comment: reliant on UE support                            Cognition Arousal/Alertness: Awake/alert Behavior During Therapy: WFL for tasks assessed/performed Overall Cognitive Status: Within Functional Limits for tasks assessed                                        Exercises      General Comments General comments (skin integrity, edema, etc.): Son present and supportive. Reviewed educ re: O2 monitoring, energy conservation, fall risk reduction, therex, activity recommendations      Pertinent Vitals/Pain Pain Assessment: No/denies pain    Home Living                      Prior Function            PT Goals (current goals can now be found in the care plan section) Progress towards PT goals: Progressing toward goals    Frequency    Min 3X/week      PT Plan Current plan remains appropriate  Co-evaluation              AM-PAC PT "6 Clicks" Mobility   Outcome Measure  Help needed turning from your back to your side while in a flat bed without using bedrails?: None Help needed moving from lying on your back to sitting on the side of a flat bed without using bedrails?: None Help needed moving to and from a bed to a chair (including a wheelchair)?: None Help needed standing up from a chair using your arms (e.g., wheelchair or bedside chair)?: None Help needed to walk in hospital room?: None Help needed climbing 3-5 steps with a railing? : A Little 6  Click Score: 23    End of Session Equipment Utilized During Treatment: Oxygen Activity Tolerance: Patient tolerated treatment well Patient left: in chair;with call bell/phone within reach;with family/visitor present Nurse Communication: Mobility status PT Visit Diagnosis: Unsteadiness on feet (R26.81);Other abnormalities of gait and mobility (R26.89);Muscle weakness (generalized) (M62.81);History of falling (Z91.81);Difficulty in walking, not elsewhere classified (R26.2)     Time: [id number] PT Time Calculation (min) (ACUTE ONLY): 24 min  Charges:  $Therapeutic Exercise: 8-22 mins $Self Care/Home Management: [place], PT, DPT Acute Rehabilitation Services  Pager [phone] Office [phone]  [name] 12/14/2019, 5:07 PM

## 2019-12-14 NOTE — Plan of Care (Signed)
  Problem: Education: Goal: Knowledge of General Education information will improve Description: Including pain rating scale, medication(s)/side effects and non-pharmacologic comfort measures Outcome: Progressing   Problem: Health Behavior/Discharge Planning: Goal: Ability to manage health-related needs will improve Outcome: Progressing   Problem: Clinical Measurements: Goal: Will remain free from infection Outcome: Progressing Goal: Diagnostic test results will improve Outcome: Progressing Goal: Respiratory complications will improve Outcome: Progressing Goal: Cardiovascular complication will be avoided Outcome: Progressing   Problem: Activity: Goal: Risk for activity intolerance will decrease Outcome: Progressing   Problem: Pain Managment: Goal: General experience of comfort will improve Outcome: Progressing   Problem: Safety: Goal: Ability to remain free from injury will improve Outcome: Progressing   Problem: Skin Integrity: Goal: Risk for impaired skin integrity will decrease Outcome: Progressing   Problem: Education: Goal: Ability to demonstrate management of disease process will improve Outcome: Progressing Goal: Ability to verbalize understanding of medication therapies will improve Outcome: Progressing Goal: Individualized Educational Video(s) Outcome: Progressing

## 2019-12-14 NOTE — Progress Notes (Signed)
SATURATION QUALIFICATIONS: (This note is used to comply with regulatory documentation for home oxygen)  Patient Saturations on Room Air at Rest = 86%  Patient Saturations on Room Air while Ambulating = 79%  Patient Saturations on 3 Liters of oxygen while Ambulating = 93%  Please briefly explain why patient needs home oxygen: Pt requires supplemental oxygen to maintain SpO2 >/88% at rest and with ambulation.  Mabeline Caras, PT, DPT Acute Rehabilitation Services  Pager [phone] Office [phone]

## 2019-12-15 LAB — HEMOGLOBIN AND HEMATOCRIT, BLOOD
HCT: 39 % (ref 36.0–46.0)
Hemoglobin: 11.6 g/dL — ABNORMAL LOW (ref 12.0–15.0)

## 2019-12-15 LAB — GLUCOSE, CAPILLARY
Glucose-Capillary: 85 mg/dL (ref 70–99)
Glucose-Capillary: 138 mg/dL — ABNORMAL HIGH (ref 70–99)
Glucose-Capillary: 120 mg/dL — ABNORMAL HIGH (ref 70–99)
Glucose-Capillary: 207 mg/dL — ABNORMAL HIGH (ref 70–99)

## 2019-12-15 LAB — BASIC METABOLIC PANEL
Anion gap: 7 (ref 5–15)
BUN: 28 mg/dL — ABNORMAL HIGH (ref 8–23)
CO2: 39 mmol/L — ABNORMAL HIGH (ref 22–32)
Calcium: 9.1 mg/dL (ref 8.9–10.3)
Chloride: 96 mmol/L — ABNORMAL LOW (ref 98–111)
Creatinine, Ser: 1.23 mg/dL — ABNORMAL HIGH (ref 0.44–1.00)
GFR calc Af Amer: 46 mL/min — ABNORMAL LOW (ref >60)
GFR calc non Af Amer: 40 mL/min — ABNORMAL LOW (ref >60)
Glucose, Bld: 103 mg/dL — ABNORMAL HIGH (ref 70–99)
Potassium: 4.2 mmol/L (ref 3.5–5.1)
Sodium: 142 mmol/L (ref 135–145)

## 2019-12-15 MED ORDER — FUROSEMIDE 10 MG/ML IJ SOLN
80.0000 mg | Freq: Two times a day (BID) | INTRAMUSCULAR | Status: DC
  Administered 2019-12-15 – 2019-12-16 (×3): 80 mg via INTRAVENOUS
  Filled 2019-12-15 (×3): qty 8

## 2019-12-15 NOTE — Progress Notes (Addendum)
PROGRESS NOTE  [name]  [id number] DOB: [birth year] DOA: 12/10/2019 PCP: Hoyt Koch, MD   Brief Narrative: HPI on 12/11/2019 by Dr. Pearson Forster Pughis a 86 y.o.femalewith medical history significant ofdCHF, CKD stage 3, DM2, HTN, COPD.  Patient has had slightly increased SOB and BLE edema from baseline over the past couple of days. Onset about 3 days ago. Occasional cough. No fevers, no CP.  Despite minimal symptoms, she took her pulse ox at home, and it was in the 70s, therefore she called EMS (actually called PCP first who told her to call EMS).  Per EMS on their arrival, her pulse ox was indeed in the 70s.  Patient brought to ED.  Interim history Admitted with acute hypoxic respiratory failure secondary to CHF exacerbation, currently on IV Lasix, dose augmented.  Assessment & Plan: Principal Problem:   Acute respiratory failure with hypoxia (HCC) Active Problems:   Acute on chronic diastolic CHF (congestive heart failure) (HCC)   Diabetic polyneuropathy associated with type 2 diabetes mellitus (HCC)   Benign hypertension with CKD (chronic kidney disease) stage III   Acute lower UTI  Acute hypoxic respiratory failure secondary to acute on chronic HFpEF: Edema improving, shortness of breath better but not at baseline, in fact hypoxemia persists though she had no home oxygen. Echocardiogram shows an EF of 60 to 65%, elevated LV end-diastolic pressure, LV diastolic parameters consistent with grade 1 diastolic dysfunction - Continue IV lasix, increase dose to 80mg  IV BID. Weight 195lbs currently, was 197lbs on 1/20 cardiology visit). - Continue strict I/O, daily weights (weight coming down), and monitoring creatinine.   Cystitis, E. coli UTI: Treated with ceftriaxone.   Chronic kidney disease, stage IIIb: Cr relatively stable with diuresis,  - Monitor in AM  Essential hypertension - Continue amlodipine, hydralazine  IDT2DM:  Well-controlled w/HbA1c 6.7%. - Continue Lantus, insulin sliding scale, 3u with meals, CBG monitoring  Diabetic peripheral neuropathy - Continue gabapentin  Obesity: BMI 33. - Patient to follow-up with PCP to discuss lifestyle modifications  RN Pressure Injury Documentation: Pressure Injury 09/20/19 Buttocks Right Stage II -  Partial thickness loss of dermis presenting as a shallow open ulcer with a red, pink wound bed without slough. (Active)  09/20/19 2300  Location: Buttocks  Location Orientation: Right  Staging: Stage II -  Partial thickness loss of dermis presenting as a shallow open ulcer with a red, pink wound bed without slough.  Wound Description (Comments):   Present on Admission: Yes   DVT prophylaxis: Lovenox Code Status: DNR Family Communication: None at bedside, RN giving son update Disposition Plan: Return home with son support once hypoxemia resolved or appears to be euvolemic. Possibly 2/11.   Consultants:   None  Procedures:   None  Antimicrobials:  Ceftriaxone   Subjective: Shortness of breath much better than admission, still short of breath with exertion which is not her baseline though. Denies chest pain. Leg swelling also improved but not at her normal. Eating ok.   Objective: Vitals:   12/14/19 2104 12/15/19 0300 12/15/19 0741 12/15/19 1531  BP: (!) 154/61  (!) 151/67 (!) 145/46  Pulse: 72  68 63  Resp:      Temp: 98.4 F (36.9 C)  98.6 F (37 C) 98.1 F (36.7 C)  TempSrc: Oral     SpO2: 94%  97% 95%  Weight:  88.2 kg    Height:        Intake/Output Summary (Last 24 hours) at 12/15/2019 1723  Last data filed at 12/15/2019 1500 Gross per 24 hour  Intake 3 ml  Output 3050 ml  Net -3047 ml   Filed Weights   12/12/19 0353 12/13/19 0429 12/15/19 0300  Weight: 91.2 kg 91.2 kg 88.2 kg    Gen: 86 y.o. female in no distress  Pulm: Non-labored breathing supplemental oxygen, bibasilar crackles persist.  CV: Regular rate and rhythm. No  murmur, rub, or gallop. No definite JVD, 1+ pitting pedal edema. GI: Abdomen soft, non-tender, non-distended, with normoactive bowel sounds. No organomegaly or masses felt. Ext: Warm, no deformities Skin: No rashes, lesions or ulcers Neuro: Alert and oriented. No focal neurological deficits. Psych: Judgement and insight appear normal. Mood & affect appropriate.   Data Reviewed: I have personally reviewed following labs and imaging studies  CBC: Recent Labs  Lab 12/10/19 2140 12/14/19 0317 12/15/19 0227  WBC 11.0* 8.3  --   NEUTROABS 8.8*  --   --   HGB 11.9* 10.9* 11.6*  HCT 40.4 37.0 39.0  MCV 92.2 91.8  --   PLT 261 263  --    Basic Metabolic Panel: Recent Labs  Lab 12/11/19 0955 12/12/19 0533 12/13/19 0219 12/14/19 0317 12/15/19 0227  NA 143 142 141 143 142  K 4.2 4.8 4.3 4.2 4.2  CL 102 103 96* 97* 96*  CO2 30 28 32 35* 39*  GLUCOSE 185* 118* 104* 145* 103*  BUN 33* 37* 35* 28* 28*  CREATININE 1.25* 1.39* 1.27* 1.11* 1.23*  CALCIUM 8.7* 8.7* 8.9 8.9 9.1   GFR: Estimated Creatinine Clearance: 35.3 mL/min (A) (by C-G formula based on SCr of 1.23 mg/dL (H)). Liver Function Tests: Recent Labs  Lab 12/10/19 2140  AST 23  ALT 24  ALKPHOS 57  BILITOT 0.7  PROT 5.8*  ALBUMIN 3.1*   No results for input(s): LIPASE, AMYLASE in the last 168 hours. No results for input(s): AMMONIA in the last 168 hours. Coagulation Profile: No results for input(s): INR, PROTIME in the last 168 hours. Cardiac Enzymes: No results for input(s): CKTOTAL, CKMB, CKMBINDEX, TROPONINI in the last 168 hours. BNP (last 3 results) No results for input(s): PROBNP in the last 8760 hours. HbA1C: No results for input(s): HGBA1C in the last 72 hours. CBG: Recent Labs  Lab 12/14/19 1725 12/14/19 2102 12/15/19 0742 12/15/19 1149 12/15/19 1531  GLUCAP 205* 142* 85 138* 120*   Lipid Profile: No results for input(s): CHOL, HDL, LDLCALC, TRIG, CHOLHDL, LDLDIRECT in the last 72 hours.  Thyroid Function Tests: No results for input(s): TSH, T4TOTAL, FREET4, T3FREE, THYROIDAB in the last 72 hours. Anemia Panel: No results for input(s): VITAMINB12, FOLATE, FERRITIN, TIBC, IRON, RETICCTPCT in the last 72 hours. Urine analysis:    Component Value Date/Time   COLORURINE YELLOW 12/11/2019 0120   APPEARANCEUR CLOUDY (A) 12/11/2019 0120   LABSPEC 1.014 12/11/2019 0120   PHURINE 5.0 12/11/2019 0120   GLUCOSEU NEGATIVE 12/11/2019 0120   HGBUR SMALL (A) 12/11/2019 0120   BILIRUBINUR NEGATIVE 12/11/2019 0120   BILIRUBINUR Neg 08/06/2018 1540   KETONESUR NEGATIVE 12/11/2019 0120   PROTEINUR NEGATIVE 12/11/2019 0120   UROBILINOGEN 0.2 08/06/2018 1540   UROBILINOGEN Normal 07/18/2017 0000   NITRITE NEGATIVE 12/11/2019 0120   LEUKOCYTESUR LARGE (A) 12/11/2019 0120   Recent Results (from the past 240 hour(s))  Respiratory Panel by RT PCR (Flu A&B, Covid) - Nasopharyngeal Swab     Status: None   Collection Time: 12/10/19 11:00 PM   Specimen: Nasopharyngeal Swab  Result Value Ref Range Status  SARS Coronavirus 2 by RT PCR NEGATIVE NEGATIVE Final    Comment: (NOTE) SARS-CoV-2 target nucleic acids are NOT DETECTED. The SARS-CoV-2 RNA is generally detectable in upper respiratoy specimens during the acute phase of infection. The lowest concentration of SARS-CoV-2 viral copies this assay can detect is 131 copies/mL. A negative result does not preclude SARS-Cov-2 infection and should not be used as the sole basis for treatment or other patient management decisions. A negative result may occur with  improper specimen collection/handling, submission of specimen other than nasopharyngeal swab, presence of viral mutation(s) within the areas targeted by this assay, and inadequate number of viral copies (<131 copies/mL). A negative result must be combined with clinical observations, patient history, and epidemiological information. The expected result is Negative. Fact Sheet for  Patients:  PinkCheek.be Fact Sheet for Healthcare Providers:  GravelBags.it This test is not yet ap proved or cleared by the [place] FDA and  has been authorized for detection and/or diagnosis of SARS-CoV-2 by FDA under an Emergency Use Authorization (EUA). This EUA will remain  in effect (meaning this test can be used) for the duration of the COVID-19 declaration under Section 564(b)(1) of the Act, 21 U.S.C. section 360bbb-3(b)(1), unless the authorization is terminated or revoked sooner.    Influenza A by PCR NEGATIVE NEGATIVE Final   Influenza B by PCR NEGATIVE NEGATIVE Final    Comment: (NOTE) The Xpert Xpress SARS-CoV-2/FLU/RSV assay is intended as an aid in  the diagnosis of influenza from Nasopharyngeal swab specimens and  should not be used as a sole basis for treatment. Nasal washings and  aspirates are unacceptable for Xpert Xpress SARS-CoV-2/FLU/RSV  testing. Fact Sheet for Patients: PinkCheek.be Fact Sheet for Healthcare Providers: GravelBags.it This test is not yet approved or cleared by the [place] FDA and  has been authorized for detection and/or diagnosis of SARS-CoV-2 by  FDA under an Emergency Use Authorization (EUA). This EUA will remain  in effect (meaning this test can be used) for the duration of the  Covid-19 declaration under Section 564(b)(1) of the Act, 21  U.S.C. section 360bbb-3(b)(1), unless the authorization is  terminated or revoked. Performed at [facility], [place] [address] [place], [place] [address]   Culture, Urine     Status: Abnormal   Collection Time: 12/11/19  1:38 AM   Specimen: Urine, Random  Result Value Ref Range Status   Specimen Description URINE, RANDOM  Final   Special Requests   Final    NONE Performed at [facility], [place] [address] [place], [place] [address]    Culture >=100,000  COLONIES/mL ESCHERICHIA COLI (A)  Final   Report Status 12/13/2019 FINAL  Final   Organism ID, Bacteria ESCHERICHIA COLI (A)  Final      Susceptibility   Escherichia coli - MIC*    AMPICILLIN <=2 SENSITIVE Sensitive     CEFAZOLIN <=4 SENSITIVE Sensitive     CEFTRIAXONE <=0.25 SENSITIVE Sensitive     CIPROFLOXACIN <=0.25 SENSITIVE Sensitive     GENTAMICIN <=1 SENSITIVE Sensitive     IMIPENEM <=0.25 SENSITIVE Sensitive     NITROFURANTOIN <=16 SENSITIVE Sensitive     TRIMETH/SULFA <=20 SENSITIVE Sensitive     AMPICILLIN/SULBACTAM <=2 SENSITIVE Sensitive     PIP/TAZO <=4 SENSITIVE Sensitive     * >=100,000 COLONIES/mL ESCHERICHIA COLI      Radiology Studies: No results found.  Scheduled Meds: . amLODipine  5 mg Oral Daily  . aspirin EC  81 mg Oral  q morning - 10a  . atorvastatin  40 mg Oral q1800  . enoxaparin (LOVENOX) injection  40 mg Subcutaneous QHS  . feeding supplement (GLUCERNA SHAKE)  237 mL Oral BID BM  . fluticasone  2 spray Each Nare Daily  . furosemide  80 mg Intravenous BID  . gabapentin  600 mg Oral QHS  . hydrALAZINE  25 mg Oral BID  . insulin aspart  0-9 Units Subcutaneous TID WC  . insulin aspart  3 Units Subcutaneous TID WC  . insulin glargine  15 Units Subcutaneous QHS  . metoprolol tartrate  25 mg Oral BID  . multivitamin with minerals  1 tablet Oral Daily  . polyethylene glycol  17 g Oral BID  . sodium chloride flush  3 mL Intravenous Q12H   Continuous Infusions: . sodium chloride       LOS: 4 days   Time spent: 25 minutes.  Patrecia Pour, MD [facility] [email or web address] 12/15/2019, 5:23 PM

## 2019-12-15 NOTE — Progress Notes (Signed)
Occupational Therapy Treatment Patient Details Name: [name] MRN: [id number] DOB: 14-Feb-1931 Today's Date: 12/15/2019    History of present illness Pt is an 87 y.o. female admitted 12/10/19 with worsenign SOB and BLE edema, also hypoxic. Worked up for acute hypoxic respiratory failure secondary to decompensated HF. PMH includes dCHF, CKD 3, COPD, HTN.   OT comments  Patient progressing towards OT goals. She completes transfers and in room mobility with supervision, min cueing for RW safety (keeping it close) and O2 line mgmt.  Patient completes grooming and LB ADLs with min guard assist. Educated on monitoring O2 saturations, safety, and PLB during activity. Will follow acutely.    Follow Up Recommendations  No OT follow up;Supervision - Intermittent    Equipment Recommendations  None recommended by OT    Recommendations for Other Services      Precautions / Restrictions Precautions Precautions: Fall Precaution Comments: hx of falls Restrictions Weight Bearing Restrictions: No       Mobility Bed Mobility Overal bed mobility: Modified Independent             General bed mobility comments: increased time  Transfers Overall transfer level: Needs assistance Equipment used: Rolling walker (2 wheeled) Transfers: Sit to/from Stand Sit to Stand: Supervision         General transfer comment: supervision for safety     Balance Overall balance assessment: Needs assistance Sitting-balance support: Feet supported;No upper extremity supported Sitting balance-Leahy Scale: Good     Standing balance support: Bilateral upper extremity supported;During functional activity Standing balance-Leahy Scale: Poor Standing balance comment: reliant on UE support dynamically, but able to complete grooming at sink without UE support                            ADL either performed or assessed with clinical judgement   ADL Overall ADL's : Needs assistance/impaired      Grooming: Min guard;Standing;Oral care               Lower Body Dressing: Min guard;Sit to/from stand   Toilet Transfer: Min guard;Ambulation;RW           Functional mobility during ADLs: Min guard;Rolling walker General ADL Comments: pt requiring min cueing for safety and RW mgmt, with O2 line mgmt      Vision       Perception     Praxis      Cognition Arousal/Alertness: Awake/alert Behavior During Therapy: WFL for tasks assessed/performed Overall Cognitive Status: Within Functional Limits for tasks assessed                                          Exercises     Shoulder Instructions       General Comments reviewed safety with RW mgmt and O2 line mgmt; educated patient on oxgyen monitoring and use of HEP provided in prior session     Pertinent Vitals/ Pain       Pain Assessment: No/denies pain  Home Living                                          Prior Functioning/Environment              Frequency  Min 2X/week  Progress Toward Goals  OT Goals(current goals can now be found in the care plan section)  Progress towards OT goals: Progressing toward goals  Acute Rehab OT Goals Patient Stated Goal: to get back home soon  OT Goal Formulation: With patient  Plan Discharge plan remains appropriate;Frequency remains appropriate    Co-evaluation                 AM-PAC OT "6 Clicks" Daily Activity     Outcome Measure   Help from another person eating meals?: None Help from another person taking care of personal grooming?: A Little Help from another person toileting, which includes using toliet, bedpan, or urinal?: A Little Help from another person bathing (including washing, rinsing, drying)?: A Little Help from another person to put on and taking off regular upper body clothing?: A Little Help from another person to put on and taking off regular lower body clothing?: A Little 6 Click Score:  19    End of Session Equipment Utilized During Treatment: Oxygen  OT Visit Diagnosis: Unsteadiness on feet (R26.81)   Activity Tolerance Patient tolerated treatment well   Patient Left in bed;with call bell/phone within reach;with bed alarm set   Nurse Communication Mobility status        Time: 1202-1227 OT Time Calculation (min): 25 min  Charges: OT General Charges $OT Visit: 1 Visit OT Treatments $Self Care/Home Management : 23-37 mins  Jolaine Artist, OT Acute Rehabilitation Services Pager [phone] Office [phone]     Delight Stare 12/15/2019, 1:19 PM

## 2019-12-16 ENCOUNTER — Inpatient Hospital Stay (HOSPITAL_COMMUNITY): Payer: Medicare Other

## 2019-12-16 ENCOUNTER — Encounter: Payer: Medicare Other | Admitting: Internal Medicine

## 2019-12-16 DIAGNOSIS — R7989 Other specified abnormal findings of blood chemistry: Secondary | ICD-10-CM

## 2019-12-16 LAB — GLUCOSE, CAPILLARY
Glucose-Capillary: 96 mg/dL (ref 70–99)
Glucose-Capillary: 196 mg/dL — ABNORMAL HIGH (ref 70–99)
Glucose-Capillary: 180 mg/dL — ABNORMAL HIGH (ref 70–99)
Glucose-Capillary: 133 mg/dL — ABNORMAL HIGH (ref 70–99)

## 2019-12-16 LAB — BASIC METABOLIC PANEL
Anion gap: 12 (ref 5–15)
BUN: 30 mg/dL — ABNORMAL HIGH (ref 8–23)
CO2: 35 mmol/L — ABNORMAL HIGH (ref 22–32)
Calcium: 9 mg/dL (ref 8.9–10.3)
Chloride: 95 mmol/L — ABNORMAL LOW (ref 98–111)
Creatinine, Ser: 1.22 mg/dL — ABNORMAL HIGH (ref 0.44–1.00)
GFR calc Af Amer: 46 mL/min — ABNORMAL LOW (ref >60)
GFR calc non Af Amer: 40 mL/min — ABNORMAL LOW (ref >60)
Glucose, Bld: 101 mg/dL — ABNORMAL HIGH (ref 70–99)
Potassium: 4.6 mmol/L (ref 3.5–5.1)
Sodium: 142 mmol/L (ref 135–145)

## 2019-12-16 MED ORDER — TRAMADOL HCL 50 MG PO TABS
50.0000 mg | ORAL_TABLET | Freq: Two times a day (BID) | ORAL | Status: DC | PRN
  Administered 2019-12-16: 50 mg via ORAL
  Filled 2019-12-16: qty 1

## 2019-12-16 MED ORDER — INSULIN ASPART 100 UNIT/ML ~~LOC~~ SOLN
5.0000 [IU] | Freq: Three times a day (TID) | SUBCUTANEOUS | Status: DC
  Administered 2019-12-16 – 2019-12-23 (×18): 5 [IU] via SUBCUTANEOUS

## 2019-12-16 MED ORDER — GABAPENTIN 300 MG PO CAPS
300.0000 mg | ORAL_CAPSULE | Freq: Every morning | ORAL | Status: DC
  Administered 2019-12-16: 300 mg via ORAL
  Filled 2019-12-16: qty 3

## 2019-12-16 NOTE — Progress Notes (Signed)
Physical Therapy Treatment Patient Details Name: [name] MRN: [id number] DOB: 09/16/1931 Today's Date: 12/16/2019    History of Present Illness Pt is an 86 y.o. female admitted 12/10/19 with worsenign SOB and BLE edema, also hypoxic. Worked up for acute hypoxic respiratory failure secondary to decompensated HF. PMH includes dCHF, CKD 3, COPD, HTN.    PT Comments    Patient very disappointed that she could not stand today due to acute onset bil foot pain. "Something must have happened overnight. They weren't like this yesterday." Patient very determined because she wants to go home, but after 15 minutes of exercises and attempts (during which she was not able to even lift her hips off the bed due to pain in feet) she agreed to return to supine. Educated to continue her AROM exercises and added bridging (as tolerated). RN and MD made aware.      Follow Up Recommendations  No PT follow up;Supervision for mobility/OOB(declined HHPT; 2/11 bil foot pain may need change of plan)     Equipment Recommendations  None recommended by PT    Recommendations for Other Services       Precautions / Restrictions Precautions Precautions: Fall Precaution Comments: hx of falls Restrictions Weight Bearing Restrictions: No    Mobility  Bed Mobility Overal bed mobility: Needs Assistance Bed Mobility: Rolling;Sidelying to Sit;Sit to Supine Rolling: Supervision Sidelying to sit: Supervision   Sit to supine: Modified independent (Device/Increase time)   General bed mobility comments: HOB flat with rail (as she has at home); pt reports incr back pain and educated on rolling FIRST and then taking legs off EOB to come to sitting; return to supne and scoot to [facility] modified indep  Transfers Overall transfer level: Needs assistance Equipment used: Rolling walker (2 wheeled) Transfers: Sit to/from Stand           General transfer comment: attempted standing from EOB for ~15 minutes with pt unable  due to bil foot pain; she was very disappointed and thought she could "wait a minute and work it out" but ultimately stated it did not feel like her neuropathy pain; she could not begin to clear her hips off the mattress (even with mattress elevated) due to pain; RN and MD made aware  Ambulation/Gait                 Stairs             Wheelchair Mobility    Modified Rankin (Stroke Patients Only)       Balance Overall balance assessment: Needs assistance Sitting-balance support: Feet supported;No upper extremity supported Sitting balance-Leahy Scale: Good                                      Cognition Arousal/Alertness: Awake/alert Behavior During Therapy: WFL for tasks assessed/performed Overall Cognitive Status: No family/caregiver present to determine baseline cognitive functioning                                 General Comments: admits she doesn't have a great memory      Exercises Other Exercises Other Exercises: seated: AROM bil ankles, toes; feet on floor heelraises; bil LAQ Other Exercises: supine: bridging, pt reports pain but tolerable     General Comments General comments (skin integrity, edema, etc.): As preparing for ambulation; removed O2 (97% on  3L) with pt dropping to 85% while seated EOB. Resumed @ 3L with return to 95% over several minutes      Pertinent Vitals/Pain Pain Assessment: Faces Faces Pain Scale: Hurts whole lot Pain Location: bottom of both feet Pain Descriptors / Indicators: Grimacing;Guarding(she states NOT burning or pins/needles; hard time describing) Pain Intervention(s): Limited activity within patient's tolerance;Monitored during session;Repositioned;Other (comment)(ROM exercises of feet, with incr'g pressure thru feet)    Home Living                      Prior Function            PT Goals (current goals can now be found in the care plan section) Acute Rehab PT Goals Patient  Stated Goal: to get back home soon  Time For Goal Achievement: 12/25/19 Potential to Achieve Goals: Good Progress towards PT goals: Not progressing toward goals - comment(new onset severe bil foot pain)    Frequency    Min 3X/week      PT Plan Other (comment)(*may need to adjust if foot pain does not decr)    Co-evaluation              AM-PAC PT "6 Clicks" Mobility   Outcome Measure  Help needed turning from your back to your side while in a flat bed without using bedrails?: None Help needed moving from lying on your back to sitting on the side of a flat bed without using bedrails?: None Help needed moving to and from a bed to a chair (including a wheelchair)?: Total Help needed standing up from a chair using your arms (e.g., wheelchair or bedside chair)?: Total Help needed to walk in hospital room?: Total Help needed climbing 3-5 steps with a railing? : Total 6 Click Score: 12    End of Session Equipment Utilized During Treatment: Oxygen Activity Tolerance: Patient limited by pain Patient left: with call bell/phone within reach;in bed;with bed alarm set Nurse Communication: Mobility status;Other (comment)(bil foot pain; PT will let MD know) PT Visit Diagnosis: Unsteadiness on feet (R26.81);Other abnormalities of gait and mobility (R26.89);Muscle weakness (generalized) (M62.81);History of falling (Z91.81);Difficulty in walking, not elsewhere classified (R26.2)     Time: [id number] PT Time Calculation (min) (ACUTE ONLY): 31 min  Charges:  $Therapeutic Exercise: 8-22 mins $Therapeutic Activity: 8-22 mins                      Arby Barrette, PT Pager [phone]    Rexanne Mano 12/16/2019, 9:47 AM

## 2019-12-16 NOTE — Progress Notes (Signed)
Bilateral lower extremity venous duplex has been completed. Preliminary results can be found in CV Proc through chart review.   12/16/19 11:14 AM [name] RVT

## 2019-12-16 NOTE — Progress Notes (Addendum)
PROGRESS NOTE  QUINTERA BURKLE  [id number] DOB: September 06, 1931 DOA: 12/10/2019 PCP: Hoyt Koch, MD   Brief Narrative: HPI on 12/11/2019 by Dr. Pearson Forster Pughis a 86 y.o.femalewith medical history significant ofdCHF, CKD stage 3, DM2, HTN, COPD.  Patient has had slightly increased SOB and BLE edema from baseline over the past couple of days. Onset about 3 days ago. Occasional cough. No fevers, no CP.  Despite minimal symptoms, she took her pulse ox at home, and it was in the 70s, therefore she called EMS (actually called PCP first who told her to call EMS).  Per EMS on their arrival, her pulse ox was indeed in the 70s.  Patient brought to ED.  Interim history Admitted with acute hypoxic respiratory failure secondary to CHF exacerbation, currently on IV Lasix, dose augmented on 2/10 with improved UOP. Remains hypoxemic so diuresis is ongoing.   Assessment & Plan: Principal Problem:   Acute respiratory failure with hypoxia (HCC) Active Problems:   Acute on chronic diastolic CHF (congestive heart failure) (HCC)   Diabetic polyneuropathy associated with type 2 diabetes mellitus (HCC)   Benign hypertension with CKD (chronic kidney disease) stage III   Acute lower UTI  Acute hypoxic respiratory failure secondary to acute on chronic HFpEF:  Hypoxemia persists though she had no home oxygen. Echocardiogram shows an EF of 60 to 65%, elevated LV end-diastolic pressure, LV diastolic parameters consistent with grade 1 diastolic dysfunction - Continue lasix 80mg  IV BID (-3.2L over previous 24 hours). Weight 195lbs currently, was 197lbs on 1/20 cardiology visit. - Continue strict I/O, daily weights (weight coming down, pending today), and monitoring creatinine. - Will check exertional pulse oximetry prior to discharge. Hopeful to DC without new oxygen requirement, but has felt like she might need this for some time now.   - Followed by Dr. Acie Fredrickson as outpatient cardiology.  Will benefit from quick follow up there following discharge.  Cystitis, E. coli UTI: Treated with ceftriaxone (pansensitive).   Chronic kidney disease, stage IIIb: Cr stable again today despite diuresis,  - Continue daily monitoring.   Essential hypertension - Continue amlodipine, hydralazine  IDT2DM: Well-controlled w/HbA1c 6.7%. Diagnosed in 1970's on insulin ever since, follows with Dr. Elyse Hsu as outpatient endocrinology.  - Continue Lantus, insulin sliding scale, 5u (up from 3u) with meals, CBG monitoring  Diabetic peripheral neuropathy: With severe intermittent pain in bilateral legs originating on soles of feet, radiating to invlove calf worse with standing. No abnormality on exam to suggest infection, acute inflammatory arthropathy (though tophus on R 1st MTP, no erythema or tenderness), DVT. Suspect neuropathy as cause  - Continue gabapentin, given CrCl >65ml/min, TDD ceiling is 1,600mg . Getting 600mg  qHS, will add 300mg  qAM, first dose this AM.  - Tramadol prn.  - R/o DVT w/LE U/S as this would change management.  Obesity: BMI 33. - Patient to follow-up with PCP to discuss lifestyle modifications  RN Pressure Injury Documentation: Pressure Injury 09/20/19 Buttocks Right Stage II -  Partial thickness loss of dermis presenting as a shallow open ulcer with a red, pink wound bed without slough. (Active)  09/20/19 2300  Location: Buttocks  Location Orientation: Right  Staging: Stage II -  Partial thickness loss of dermis presenting as a shallow open ulcer with a red, pink wound bed without slough.  Wound Description (Comments):   Present on Admission: Yes   DVT prophylaxis: Lovenox Code Status: DNR Family Communication: None at bedside, RN giving son update Disposition Plan: Return home with  son support once hypoxemia resolved or appears to be euvolemic. Still requires augmented IV lasix and close monitoring, remains hypoxemic.  Consultants:   None  Procedures:    None  Antimicrobials:  Ceftriaxone   Subjective: Had severe bilateral feet pain when working with PT this AM. No trauma or other suspected provocation. This is improved with rest, severe with some palpation of soles of feet, no redness or swelling that she's noticed. Has pain somewhat similar to this with neuropathy but never this severe.   Objective: Vitals:   12/15/19 1531 12/15/19 2149 12/16/19 0736 12/16/19 0737  BP: (!) 145/46 (!) 138/50 (!) 144/60   Pulse: 63 73 76 78  Resp:  16 15   Temp: 98.1 F (36.7 C) 99.4 F (37.4 C)  99.6 F (37.6 C)  TempSrc:  Oral    SpO2: 95% 93% 97% 97%  Weight:      Height:        Intake/Output Summary (Last 24 hours) at 12/16/2019 1405 Last data filed at 12/16/2019 [id number] Gross per 24 hour  Intake --  Output 3275 ml  Net -3275 ml   Filed Weights   12/12/19 0353 12/13/19 0429 12/15/19 0300  Weight: 91.2 kg 91.2 kg 88.2 kg   Gen: 86 y.o. female in no distress Pulm: Nonlabored breathing 3L O2. Crackles at bases. CV: Regular rate and rhythm. No murmur, rub, or gallop. No JVD, trace pitting dependent edema. GI: Abdomen soft, non-tender, non-distended, with normoactive bowel sounds.  Ext: Warm, no deformities. Feet appear normal without erythema and have no palpable deformity. Right 1st MTP with palpable spongy tophi that are nontender. No joint warmth.  Skin: No rashes, lesions or ulcers on visualized skin. Neuro: Alert and oriented. No focal neurological deficits. Psych: Judgement and insight appear fair. Mood euthymic & affect congruent. Behavior is appropriate.    Data Reviewed: I have personally reviewed following labs and imaging studies  CBC: Recent Labs  Lab 12/10/19 2140 12/14/19 0317 12/15/19 0227  WBC 11.0* 8.3  --   NEUTROABS 8.8*  --   --   HGB 11.9* 10.9* 11.6*  HCT 40.4 37.0 39.0  MCV 92.2 91.8  --   PLT 261 263  --    Basic Metabolic Panel: Recent Labs  Lab 12/12/19 0533 12/13/19 0219 12/14/19 0317  12/15/19 0227 12/16/19 0849  NA 142 141 143 142 142  K 4.8 4.3 4.2 4.2 4.6  CL 103 96* 97* 96* 95*  CO2 28 32 35* 39* 35*  GLUCOSE 118* 104* 145* 103* 101*  BUN 37* 35* 28* 28* 30*  CREATININE 1.39* 1.27* 1.11* 1.23* 1.22*  CALCIUM 8.7* 8.9 8.9 9.1 9.0   GFR: Estimated Creatinine Clearance: 35.6 mL/min (A) (by C-G formula based on SCr of 1.22 mg/dL (H)). Liver Function Tests: Recent Labs  Lab 12/10/19 2140  AST 23  ALT 24  ALKPHOS 57  BILITOT 0.7  PROT 5.8*  ALBUMIN 3.1*   No results for input(s): LIPASE, AMYLASE in the last 168 hours. No results for input(s): AMMONIA in the last 168 hours. Coagulation Profile: No results for input(s): INR, PROTIME in the last 168 hours. Cardiac Enzymes: No results for input(s): CKTOTAL, CKMB, CKMBINDEX, TROPONINI in the last 168 hours. BNP (last 3 results) No results for input(s): PROBNP in the last 8760 hours. HbA1C: No results for input(s): HGBA1C in the last 72 hours. CBG: Recent Labs  Lab 12/15/19 1149 12/15/19 1531 12/15/19 2122 12/16/19 0813 12/16/19 1206  GLUCAP 138* 120* 207* 96  196*   Lipid Profile: No results for input(s): CHOL, HDL, LDLCALC, TRIG, CHOLHDL, LDLDIRECT in the last 72 hours. Thyroid Function Tests: No results for input(s): TSH, T4TOTAL, FREET4, T3FREE, THYROIDAB in the last 72 hours. Anemia Panel: No results for input(s): VITAMINB12, FOLATE, FERRITIN, TIBC, IRON, RETICCTPCT in the last 72 hours. Urine analysis:    Component Value Date/Time   COLORURINE YELLOW 12/11/2019 0120   APPEARANCEUR CLOUDY (A) 12/11/2019 0120   LABSPEC 1.014 12/11/2019 0120   PHURINE 5.0 12/11/2019 0120   GLUCOSEU NEGATIVE 12/11/2019 0120   HGBUR SMALL (A) 12/11/2019 0120   BILIRUBINUR NEGATIVE 12/11/2019 0120   BILIRUBINUR Neg 08/06/2018 1540   KETONESUR NEGATIVE 12/11/2019 0120   PROTEINUR NEGATIVE 12/11/2019 0120   UROBILINOGEN 0.2 08/06/2018 1540   UROBILINOGEN Normal 07/18/2017 0000   NITRITE NEGATIVE 12/11/2019  0120   LEUKOCYTESUR LARGE (A) 12/11/2019 0120   Recent Results (from the past 240 hour(s))  Respiratory Panel by RT PCR (Flu A&B, Covid) - Nasopharyngeal Swab     Status: None   Collection Time: 12/10/19 11:00 PM   Specimen: Nasopharyngeal Swab  Result Value Ref Range Status   SARS Coronavirus 2 by RT PCR NEGATIVE NEGATIVE Final    Comment: (NOTE) SARS-CoV-2 target nucleic acids are NOT DETECTED. The SARS-CoV-2 RNA is generally detectable in upper respiratoy specimens during the acute phase of infection. The lowest concentration of SARS-CoV-2 viral copies this assay can detect is 131 copies/mL. A negative result does not preclude SARS-Cov-2 infection and should not be used as the sole basis for treatment or other patient management decisions. A negative result may occur with  improper specimen collection/handling, submission of specimen other than nasopharyngeal swab, presence of viral mutation(s) within the areas targeted by this assay, and inadequate number of viral copies (<131 copies/mL). A negative result must be combined with clinical observations, patient history, and epidemiological information. The expected result is Negative. Fact Sheet for Patients:  PinkCheek.be Fact Sheet for Healthcare Providers:  GravelBags.it This test is not yet ap proved or cleared by the [place] FDA and  has been authorized for detection and/or diagnosis of SARS-CoV-2 by FDA under an Emergency Use Authorization (EUA). This EUA will remain  in effect (meaning this test can be used) for the duration of the COVID-19 declaration under Section 564(b)(1) of the Act, 21 U.S.C. section 360bbb-3(b)(1), unless the authorization is terminated or revoked sooner.    Influenza A by PCR NEGATIVE NEGATIVE Final   Influenza B by PCR NEGATIVE NEGATIVE Final    Comment: (NOTE) The Xpert Xpress SARS-CoV-2/FLU/RSV assay is intended as an aid in  the  diagnosis of influenza from Nasopharyngeal swab specimens and  should not be used as a sole basis for treatment. Nasal washings and  aspirates are unacceptable for Xpert Xpress SARS-CoV-2/FLU/RSV  testing. Fact Sheet for Patients: PinkCheek.be Fact Sheet for Healthcare Providers: GravelBags.it This test is not yet approved or cleared by the [place] FDA and  has been authorized for detection and/or diagnosis of SARS-CoV-2 by  FDA under an Emergency Use Authorization (EUA). This EUA will remain  in effect (meaning this test can be used) for the duration of the  Covid-19 declaration under Section 564(b)(1) of the Act, 21  U.S.C. section 360bbb-3(b)(1), unless the authorization is  terminated or revoked. Performed at [facility], [place] [address], [place],  [address]   Culture, Urine     Status: Abnormal   Collection Time: 12/11/19  1:38 AM   Specimen: Urine,  Random  Result Value Ref Range Status   Specimen Description URINE, RANDOM  Final   Special Requests   Final    NONE Performed at [facility], [address], [place], [place] [address]    Culture >=100,000 COLONIES/mL ESCHERICHIA COLI (A)  Final   Report Status 12/13/2019 FINAL  Final   Organism ID, Bacteria ESCHERICHIA COLI (A)  Final      Susceptibility   Escherichia coli - MIC*    AMPICILLIN <=2 SENSITIVE Sensitive     CEFAZOLIN <=4 SENSITIVE Sensitive     CEFTRIAXONE <=0.25 SENSITIVE Sensitive     CIPROFLOXACIN <=0.25 SENSITIVE Sensitive     GENTAMICIN <=1 SENSITIVE Sensitive     IMIPENEM <=0.25 SENSITIVE Sensitive     NITROFURANTOIN <=16 SENSITIVE Sensitive     TRIMETH/SULFA <=20 SENSITIVE Sensitive     AMPICILLIN/SULBACTAM <=2 SENSITIVE Sensitive     PIP/TAZO <=4 SENSITIVE Sensitive     * >=100,000 COLONIES/mL ESCHERICHIA COLI      Radiology Studies: VAS [place] LOWER EXTREMITY VENOUS (DVT)  Result Date: 12/16/2019  Lower Venous  DVTStudy Indications: Elevated Ddimer.  Risk Factors: None identified. Limitations: Body habitus and poor ultrasound/tissue interface. Comparison Study: No prior studies. Performing Technologist: Oliver Hum RVT  Examination Guidelines: A complete evaluation includes B-mode imaging, spectral Doppler, color Doppler, and power Doppler as needed of all accessible portions of each vessel. Bilateral testing is considered an integral part of a complete examination. Limited examinations for reoccurring indications may be performed as noted. The reflux portion of the exam is performed with the patient in reverse Trendelenburg.  +---------+---------------+---------+-----------+----------+--------------+ RIGHT    CompressibilityPhasicitySpontaneityPropertiesThrombus Aging +---------+---------------+---------+-----------+----------+--------------+ CFV      Full           Yes      Yes                                 +---------+---------------+---------+-----------+----------+--------------+ SFJ      Full                                                        +---------+---------------+---------+-----------+----------+--------------+ FV Prox  Full                                                        +---------+---------------+---------+-----------+----------+--------------+ FV Mid   Full                                                        +---------+---------------+---------+-----------+----------+--------------+ FV DistalFull                                                        +---------+---------------+---------+-----------+----------+--------------+ PFV      Full                                                        +---------+---------------+---------+-----------+----------+--------------+  POP      Full           Yes      Yes                                 +---------+---------------+---------+-----------+----------+--------------+ PTV      Full                                                         +---------+---------------+---------+-----------+----------+--------------+ PERO     Full                                                        +---------+---------------+---------+-----------+----------+--------------+   +---------+---------------+---------+-----------+----------+--------------+ LEFT     CompressibilityPhasicitySpontaneityPropertiesThrombus Aging +---------+---------------+---------+-----------+----------+--------------+ CFV      Full           Yes      Yes                                 +---------+---------------+---------+-----------+----------+--------------+ SFJ      Full                                                        +---------+---------------+---------+-----------+----------+--------------+ FV Prox  Full                                                        +---------+---------------+---------+-----------+----------+--------------+ FV Mid   Full                                                        +---------+---------------+---------+-----------+----------+--------------+ FV DistalFull                                                        +---------+---------------+---------+-----------+----------+--------------+ PFV      Full                                                        +---------+---------------+---------+-----------+----------+--------------+ POP      Full           Yes      Yes                                 +---------+---------------+---------+-----------+----------+--------------+  PTV      Full                                                        +---------+---------------+---------+-----------+----------+--------------+ PERO     Full                                                        +---------+---------------+---------+-----------+----------+--------------+     Summary: RIGHT: - There is no evidence of deep vein thrombosis in the  lower extremity.  - No cystic structure found in the popliteal fossa.  LEFT: - There is no evidence of deep vein thrombosis in the lower extremity.  - No cystic structure found in the popliteal fossa.  *See table(s) above for measurements and observations.    Preliminary     Scheduled Meds: . amLODipine  5 mg Oral Daily  . aspirin EC  81 mg Oral q morning - 10a  . atorvastatin  40 mg Oral q1800  . enoxaparin (LOVENOX) injection  40 mg Subcutaneous QHS  . feeding supplement (GLUCERNA SHAKE)  237 mL Oral BID BM  . fluticasone  2 spray Each Nare Daily  . furosemide  80 mg Intravenous BID  . gabapentin  300 mg Oral q morning - 10a  . gabapentin  600 mg Oral QHS  . hydrALAZINE  25 mg Oral BID  . insulin aspart  0-9 Units Subcutaneous TID WC  . insulin aspart  3 Units Subcutaneous TID WC  . insulin glargine  15 Units Subcutaneous QHS  . metoprolol tartrate  25 mg Oral BID  . multivitamin with minerals  1 tablet Oral Daily  . polyethylene glycol  17 g Oral BID  . sodium chloride flush  3 mL Intravenous Q12H   Continuous Infusions: . sodium chloride       LOS: 5 days   Time spent: 35 minutes.  Patrecia Pour, MD [facility] [email or web address] 12/16/2019, 2:05 PM

## 2019-12-17 ENCOUNTER — Inpatient Hospital Stay (HOSPITAL_COMMUNITY): Payer: Medicare Other

## 2019-12-17 LAB — BASIC METABOLIC PANEL
Anion gap: 12 (ref 5–15)
BUN: 33 mg/dL — ABNORMAL HIGH (ref 8–23)
CO2: 36 mmol/L — ABNORMAL HIGH (ref 22–32)
Calcium: 8.7 mg/dL — ABNORMAL LOW (ref 8.9–10.3)
Chloride: 90 mmol/L — ABNORMAL LOW (ref 98–111)
Creatinine, Ser: 1.35 mg/dL — ABNORMAL HIGH (ref 0.44–1.00)
GFR calc Af Amer: 41 mL/min — ABNORMAL LOW (ref >60)
GFR calc non Af Amer: 35 mL/min — ABNORMAL LOW (ref >60)
Glucose, Bld: 150 mg/dL — ABNORMAL HIGH (ref 70–99)
Potassium: 4.6 mmol/L (ref 3.5–5.1)
Sodium: 138 mmol/L (ref 135–145)

## 2019-12-17 LAB — URINALYSIS, ROUTINE W REFLEX MICROSCOPIC
Bilirubin Urine: NEGATIVE
Glucose, UA: NEGATIVE mg/dL
Hgb urine dipstick: NEGATIVE
Ketones, ur: NEGATIVE mg/dL
Leukocytes,Ua: NEGATIVE
Nitrite: NEGATIVE
Protein, ur: NEGATIVE mg/dL
Specific Gravity, Urine: 1.012 (ref 1.005–1.030)
pH: 5 (ref 5.0–8.0)

## 2019-12-17 LAB — AMMONIA: Ammonia: 20 umol/L (ref 9–35)

## 2019-12-17 LAB — CBC
HCT: 38.7 % (ref 36.0–46.0)
Hemoglobin: 11.6 g/dL — ABNORMAL LOW (ref 12.0–15.0)
MCH: 27 pg (ref 26.0–34.0)
MCHC: 30 g/dL (ref 30.0–36.0)
MCV: 90 fL (ref 80.0–100.0)
Platelets: 248 10*3/uL (ref 150–400)
RBC: 4.3 MIL/uL (ref 3.87–5.11)
RDW: 14.9 % (ref 11.5–15.5)
WBC: 14.8 10*3/uL — ABNORMAL HIGH (ref 4.0–10.5)
nRBC: 0 % (ref 0.0–0.2)

## 2019-12-17 LAB — GLUCOSE, CAPILLARY
Glucose-Capillary: 129 mg/dL — ABNORMAL HIGH (ref 70–99)
Glucose-Capillary: 118 mg/dL — ABNORMAL HIGH (ref 70–99)
Glucose-Capillary: 163 mg/dL — ABNORMAL HIGH (ref 70–99)
Glucose-Capillary: 185 mg/dL — ABNORMAL HIGH (ref 70–99)

## 2019-12-17 LAB — URIC ACID: Uric Acid, Serum: 10.3 mg/dL — ABNORMAL HIGH (ref 2.5–7.1)

## 2019-12-17 LAB — VITAMIN B12: Vitamin B-12: 471 pg/mL (ref 180–914)

## 2019-12-17 LAB — TSH: TSH: 0.347 u[IU]/mL — ABNORMAL LOW (ref 0.350–4.500)

## 2019-12-17 LAB — SEDIMENTATION RATE: Sed Rate: 32 mm/hr — ABNORMAL HIGH (ref 0–22)

## 2019-12-17 LAB — T4, FREE: Free T4: 0.98 ng/dL (ref 0.61–1.12)

## 2019-12-17 MED ORDER — METHYLPREDNISOLONE SODIUM SUCC 40 MG IJ SOLR
40.0000 mg | Freq: Every day | INTRAMUSCULAR | Status: DC
  Administered 2019-12-18 – 2019-12-21 (×4): 40 mg via INTRAVENOUS
  Filled 2019-12-17 (×4): qty 1

## 2019-12-17 MED ORDER — ALLOPURINOL 100 MG PO TABS
50.0000 mg | ORAL_TABLET | Freq: Every day | ORAL | Status: DC
  Administered 2019-12-17 – 2019-12-24 (×8): 50 mg via ORAL
  Filled 2019-12-17 (×8): qty 1

## 2019-12-17 MED ORDER — FUROSEMIDE 80 MG PO TABS
80.0000 mg | ORAL_TABLET | Freq: Every day | ORAL | Status: DC
  Administered 2019-12-17 – 2019-12-24 (×8): 80 mg via ORAL
  Filled 2019-12-17 (×8): qty 1

## 2019-12-17 MED ORDER — GABAPENTIN 100 MG PO CAPS
100.0000 mg | ORAL_CAPSULE | Freq: Every morning | ORAL | Status: DC
  Administered 2019-12-17: 100 mg via ORAL
  Filled 2019-12-17: qty 1

## 2019-12-17 MED ORDER — METHYLPREDNISOLONE SODIUM SUCC 125 MG IJ SOLR
125.0000 mg | Freq: Once | INTRAMUSCULAR | Status: AC
  Administered 2019-12-17: 125 mg via INTRAVENOUS
  Filled 2019-12-17: qty 2

## 2019-12-17 MED ORDER — ENOXAPARIN SODIUM 30 MG/0.3ML ~~LOC~~ SOLN
30.0000 mg | Freq: Every day | SUBCUTANEOUS | Status: DC
  Administered 2019-12-17 – 2019-12-23 (×7): 30 mg via SUBCUTANEOUS
  Filled 2019-12-17 (×7): qty 0.3

## 2019-12-17 MED ORDER — OXYCODONE HCL 5 MG PO TABS
5.0000 mg | ORAL_TABLET | ORAL | Status: DC | PRN
  Administered 2019-12-18 – 2019-12-24 (×4): 5 mg via ORAL
  Filled 2019-12-17 (×4): qty 1

## 2019-12-17 NOTE — Progress Notes (Signed)
OT Cancellation Note  Patient Details Name: CARONDA VENDETTI MRN: [id number] DOB: 1931/11/04   Cancelled Treatment:    Reason Eval/Treat Not Completed: Fatigue/lethargy limiting ability to participate. Per conversation with PT, pt is very lethargic this morning. Plan to reattempt at a later time/date.  Tyrone Schimke, OT Acute Rehabilitation Services Pager: [phone] Office: [phone]  12/17/2019, 8:55 AM

## 2019-12-17 NOTE — Progress Notes (Signed)
Patient's LE pain has worsened through the day. Reevaluated at bedside. She now has much more focal tenderness mostly at right knee which is also now warm and with effusion (neither present this morning). Left knee tender but less than right and less swollen. No other definitely affected joints. No erythema. In the setting of diuresis and history of gout, strongly suspect this is etiology. - Oxycodone prn pain (has allergy listed which is not true allergy, confirmed with pt and son) - Solumedrol 125mg  IV x1 now, start daily tomorrow - Discussed with orthopedics, Dr. Stann Mainland who will evaluate the patient tomorrow. No further recommendations at this time.   Vance Gather, MD 12/17/2019 6:24 PM

## 2019-12-17 NOTE — Progress Notes (Addendum)
PROGRESS NOTE  [name]  [id number] DOB: 1930-12-21 DOA: 12/10/2019 PCP: Hoyt Koch, MD   Brief Narrative: HPI on 12/11/2019 by Dr. Pearson Forster Pughis a 86 y.o.femalewith medical history significant ofdCHF, CKD stage 3, DM2, HTN, COPD.  Patient has had slightly increased SOB and BLE edema from baseline over the past couple of days. Onset about 3 days ago. Occasional cough. No fevers, no CP.  Despite minimal symptoms, she took her pulse ox at home, and it was in the 70s, therefore she called EMS (actually called PCP first who told her to call EMS).  Per EMS on their arrival, her pulse ox was indeed in the 70s.  Patient brought to ED.  Interim history Admitted with acute hypoxic respiratory failure secondary to CHF exacerbation, currently on IV Lasix, dose augmented on 2/10 with improved UOP. Remained hypoxemic so diuresis was continued. On 2/11 the patient complained of severe bilateral feet/leg pain consistent with peripheral neuropathy for which U/S was ordered ruling out DVT and gabapentin dose was increased. On 2/12, creatinine bumped with concomitant lethargy, so gabapentin stopped for washout period and diuresis deescalated.  Assessment & Plan: Principal Problem:   Acute respiratory failure with hypoxia (HCC) Active Problems:   Acute on chronic diastolic CHF (congestive heart failure) (HCC)   Diabetic polyneuropathy associated with type 2 diabetes mellitus (HCC)   Benign hypertension with CKD (chronic kidney disease) stage III   Acute lower UTI  Acute hypoxic respiratory failure secondary to acute on chronic HFpEF:  Hypoxemia persists though she had no home oxygen. Echocardiogram shows an EF of 60 to 65%, elevated LV end-diastolic pressure, LV diastolic parameters consistent with grade 1 diastolic dysfunction - Patient appears roughly euvolemic this AM with improvement in crackles and no LE edema. Less UOP charted over previous 24 hours. Will  return to home lasix 80mg  po daily.  - Continue I/O, daily weights. Note weights are highly variable, charted as down to 143lbs yesterday, though this is unlikely to be accurate, had previously been 194lbs. Noted to have been 197lbs on 1/20 cardiology visit. - Remains hypoxemic, will check CXR - Followed by Dr. Acie Fredrickson as outpatient cardiology. Will benefit from quick follow up there following discharge.  Lethargy: Coinciding with AKI due to diuresis and gabapentin dose escalation. She is rousable, oriented with amnesia of last night, and certainly maintaining airway, doubt hypercarbia. No new metabolic derangementsfocal deficits on exam to suggest acute ischemic insult. CBGs at goal without hypoglycemia. - Check TSH, B12, ammonia as work up for this and for secondary causes of neuropathy.  - If drowsiness does not improve or patient develops focal signs/symptoms, would perform neuroimaging with CT. - Delirium precautions  E. coli UTI: Treated with ceftriaxone (pansensitive) x3 days. Has history of frequent recurrence and WBC rising with low grade fever this AM.  - Recheck UA and culture to ensure clearance.  AKI on chronic kidney disease, stage IIIb: Cr slightly elevated today in setting of diuresis with rising BUN and contraction alkalosis.  - Decrease diuretic, continue monitoring in AM - Avoid other nephrotoxins and continue close attention to CrCl with gabapentin dosing once reinitiated.   Essential hypertension - Continue amlodipine, hydralazine  IDT2DM: Well-controlled w/HbA1c 6.7%. Diagnosed in 1970's on insulin ever since, follows with Dr. Elyse Hsu as outpatient endocrinology.  - Continue Lantus, insulin sliding scale, 5u (up from 3u) with meals, CBG monitoring  Diabetic peripheral neuropathy: With severe intermittent pain in bilateral legs originating on soles of feet, radiating to  invlove calf worse with standing. No abnormality on exam to suggest infection, acute inflammatory  arthropathy (though tophus on R 1st MTP, no erythema or tenderness), DVT (negative venous U/S). Suspect neuropathy as cause  - Will hold gabapentin due to lethargy this AM. Hold tramadol as well.   Chronic gout: With elevated uric acid well above 6 and presence of tophi, will start allopurinol at lowest dose.  - Allopurinol 50mg  qd.  - Consider steroids if flare  Obesity: BMI 33. - Patient to follow-up with PCP to discuss lifestyle modifications  RN Pressure Injury Documentation: Pressure Injury 09/20/19 Buttocks Right Stage II -  Partial thickness loss of dermis presenting as a shallow open ulcer with a red, pink wound bed without slough. (Active)  09/20/19 2300  Location: Buttocks  Location Orientation: Right  Staging: Stage II -  Partial thickness loss of dermis presenting as a shallow open ulcer with a red, pink wound bed without slough.  Wound Description (Comments):   Present on Admission: Yes   DVT prophylaxis: Lovenox, decrease dose due to renal insufficiency Code Status: DNR Family Communication: None at bedside, Updated son by phone 2/11, no answer when called today. Disposition Plan: Uncertain. Has completed IV diuresis, still hypoxemic. Now with mental status changes related to AKI. This with lower extremity pain now change possible venue at DC to SNF. Will require continued inpatient management and work up of encephalopathy prior to discharge.   Consultants:   None  Procedures:   None  Antimicrobials:  Ceftriaxone   Subjective: Developed confusion last night, does not remember it. Didn't sleep much and is very drowsy this morning. Couldn't get up with PT due to weakness and continued pain in both legs/feet. Denies any chest pain, no leg swelling. She is completely aware of the events of this morning, oriented to person, place, situation, myself, recalls last few days of our conversations.   Objective: Vitals:   12/16/19 1757 12/16/19 1804 12/16/19 2202 12/17/19  0828  BP: (!) 134/52  (!) 144/50 (!) 137/47  Pulse: 69  82 83  Resp: 15  16 (!) 21  Temp: 98.1 F (36.7 C)  99.9 F (37.7 C) 99.4 F (37.4 C)  TempSrc:   Oral Oral  SpO2: 97%  95% 99%  Weight:  65.3 kg    Height:        Intake/Output Summary (Last 24 hours) at 12/17/2019 1115 Last data filed at 12/17/2019 0700 Gross per 24 hour  Intake 953 ml  Output 1050 ml  Net -97 ml   Filed Weights   12/13/19 0429 12/15/19 0300 12/16/19 1804  Weight: 91.2 kg 88.2 kg 65.3 kg   Gen: Elderly female in no distress Pulm: Nonlabored breathing 3L O2, high 90%'s. Clear laterally. Pt reports too weak to sit up. CV: Regular rate and rhythm. No murmur, rub, or gallop. No JVD, no pitting dependent edema. GI: Abdomen soft, non-tender, non-distended, with normoactive bowel sounds.  Ext: Warm, no deformities. Legs without erythema, induration, focal tenderness over bony prominences or muscle group, just diffusely tender to palpation throughout feet and to a lesser degree the legs below knee. This is symmetrical. DP pulses 2+ and cap refill is brisk. There is no erythematous joint. Skin: No rashes, lesions or ulcers on visualized skin. Neuro: Drowsy, closes eyes in the middle of our conversation but is able to respond to questions I ask when she does this. Aware that she's very sleepy, rouses appropriately. No motor deficits or sensory deficits in trunk, face,  extremities. Psych: Judgement and insight appear fair, attention is fleeting.   Data Reviewed: I have personally reviewed following labs and imaging studies  CBC: Recent Labs  Lab 12/10/19 2140 12/14/19 0317 12/15/19 0227 12/17/19 0151  WBC 11.0* 8.3  --  14.8*  NEUTROABS 8.8*  --   --   --   HGB 11.9* 10.9* 11.6* 11.6*  HCT 40.4 37.0 39.0 38.7  MCV 92.2 91.8  --  90.0  PLT 261 263  --  [id number]   Basic Metabolic Panel: Recent Labs  Lab 12/13/19 0219 12/14/19 0317 12/15/19 0227 12/16/19 0849 12/17/19 0151  NA 141 143 142 142 138  K 4.3  4.2 4.2 4.6 4.6  CL 96* 97* 96* 95* 90*  CO2 32 35* 39* 35* 36*  GLUCOSE 104* 145* 103* 101* 150*  BUN 35* 28* 28* 30* 33*  CREATININE 1.27* 1.11* 1.23* 1.22* 1.35*  CALCIUM 8.9 8.9 9.1 9.0 8.7*   GFR: Estimated Creatinine Clearance: 25.8 mL/min (A) (by C-G formula based on SCr of 1.35 mg/dL (H)). Liver Function Tests: Recent Labs  Lab 12/10/19 2140  AST 23  ALT 24  ALKPHOS 57  BILITOT 0.7  PROT 5.8*  ALBUMIN 3.1*   No results for input(s): LIPASE, AMYLASE in the last 168 hours. No results for input(s): AMMONIA in the last 168 hours. Coagulation Profile: No results for input(s): INR, PROTIME in the last 168 hours. Cardiac Enzymes: No results for input(s): CKTOTAL, CKMB, CKMBINDEX, TROPONINI in the last 168 hours. BNP (last 3 results) No results for input(s): PROBNP in the last 8760 hours. HbA1C: No results for input(s): HGBA1C in the last 72 hours. CBG: Recent Labs  Lab 12/16/19 0813 12/16/19 1206 12/16/19 1531 12/16/19 2104 12/17/19 0739  GLUCAP 96 196* 180* 133* 129*   Lipid Profile: No results for input(s): CHOL, HDL, LDLCALC, TRIG, CHOLHDL, LDLDIRECT in the last 72 hours. Thyroid Function Tests: No results for input(s): TSH, T4TOTAL, FREET4, T3FREE, THYROIDAB in the last 72 hours. Anemia Panel: No results for input(s): VITAMINB12, FOLATE, FERRITIN, TIBC, IRON, RETICCTPCT in the last 72 hours. Urine analysis:    Component Value Date/Time   COLORURINE YELLOW 12/11/2019 0120   APPEARANCEUR CLOUDY (A) 12/11/2019 0120   LABSPEC 1.014 12/11/2019 0120   PHURINE 5.0 12/11/2019 0120   GLUCOSEU NEGATIVE 12/11/2019 0120   HGBUR SMALL (A) 12/11/2019 0120   BILIRUBINUR NEGATIVE 12/11/2019 0120   BILIRUBINUR Neg 08/06/2018 1540   KETONESUR NEGATIVE 12/11/2019 0120   PROTEINUR NEGATIVE 12/11/2019 0120   UROBILINOGEN 0.2 08/06/2018 1540   UROBILINOGEN Normal 07/18/2017 0000   NITRITE NEGATIVE 12/11/2019 0120   LEUKOCYTESUR LARGE (A) 12/11/2019 0120   Recent  Results (from the past 240 hour(s))  Respiratory Panel by RT PCR (Flu A&B, Covid) - Nasopharyngeal Swab     Status: None   Collection Time: 12/10/19 11:00 PM   Specimen: Nasopharyngeal Swab  Result Value Ref Range Status   SARS Coronavirus 2 by RT PCR NEGATIVE NEGATIVE Final    Comment: (NOTE) SARS-CoV-2 target nucleic acids are NOT DETECTED. The SARS-CoV-2 RNA is generally detectable in upper respiratoy specimens during the acute phase of infection. The lowest concentration of SARS-CoV-2 viral copies this assay can detect is 131 copies/mL. A negative result does not preclude SARS-Cov-2 infection and should not be used as the sole basis for treatment or other patient management decisions. A negative result may occur with  improper specimen collection/handling, submission of specimen other than nasopharyngeal swab, presence of viral mutation(s) within the areas  targeted by this assay, and inadequate number of viral copies (<131 copies/mL). A negative result must be combined with clinical observations, patient history, and epidemiological information. The expected result is Negative. Fact Sheet for Patients:  PinkCheek.be Fact Sheet for Healthcare Providers:  GravelBags.it This test is not yet ap proved or cleared by the [place] FDA and  has been authorized for detection and/or diagnosis of SARS-CoV-2 by FDA under an Emergency Use Authorization (EUA). This EUA will remain  in effect (meaning this test can be used) for the duration of the COVID-19 declaration under Section 564(b)(1) of the Act, 21 U.S.C. section 360bbb-3(b)(1), unless the authorization is terminated or revoked sooner.    Influenza A by PCR NEGATIVE NEGATIVE Final   Influenza B by PCR NEGATIVE NEGATIVE Final    Comment: (NOTE) The Xpert Xpress SARS-CoV-2/FLU/RSV assay is intended as an aid in  the diagnosis of influenza from Nasopharyngeal swab specimens  and  should not be used as a sole basis for treatment. Nasal washings and  aspirates are unacceptable for Xpert Xpress SARS-CoV-2/FLU/RSV  testing. Fact Sheet for Patients: PinkCheek.be Fact Sheet for Healthcare Providers: GravelBags.it This test is not yet approved or cleared by the [place] FDA and  has been authorized for detection and/or diagnosis of SARS-CoV-2 by  FDA under an Emergency Use Authorization (EUA). This EUA will remain  in effect (meaning this test can be used) for the duration of the  Covid-19 declaration under Section 564(b)(1) of the Act, 21  U.S.C. section 360bbb-3(b)(1), unless the authorization is  terminated or revoked. Performed at [facility], [place] [address] [place], [place] [address]   Culture, Urine     Status: Abnormal   Collection Time: 12/11/19  1:38 AM   Specimen: Urine, Random  Result Value Ref Range Status   Specimen Description URINE, RANDOM  Final   Special Requests   Final    NONE Performed at [facility], [place] [address] [place], [place] [address]    Culture >=100,000 COLONIES/mL ESCHERICHIA COLI (A)  Final   Report Status 12/13/2019 FINAL  Final   Organism ID, Bacteria ESCHERICHIA COLI (A)  Final      Susceptibility   Escherichia coli - MIC*    AMPICILLIN <=2 SENSITIVE Sensitive     CEFAZOLIN <=4 SENSITIVE Sensitive     CEFTRIAXONE <=0.25 SENSITIVE Sensitive     CIPROFLOXACIN <=0.25 SENSITIVE Sensitive     GENTAMICIN <=1 SENSITIVE Sensitive     IMIPENEM <=0.25 SENSITIVE Sensitive     NITROFURANTOIN <=16 SENSITIVE Sensitive     TRIMETH/SULFA <=20 SENSITIVE Sensitive     AMPICILLIN/SULBACTAM <=2 SENSITIVE Sensitive     PIP/TAZO <=4 SENSITIVE Sensitive     * >=100,000 COLONIES/mL ESCHERICHIA COLI      Radiology Studies: VAS [place] LOWER EXTREMITY VENOUS (DVT)  Result Date: 12/16/2019  Lower Venous DVTStudy Indications: Elevated Ddimer.  Risk Factors: None  identified. Limitations: Body habitus and poor ultrasound/tissue interface. Comparison Study: No prior studies. Performing Technologist: Oliver Hum RVT  Examination Guidelines: A complete evaluation includes B-mode imaging, spectral Doppler, color Doppler, and power Doppler as needed of all accessible portions of each vessel. Bilateral testing is considered an integral part of a complete examination. Limited examinations for reoccurring indications may be performed as noted. The reflux portion of the exam is performed with the patient in reverse Trendelenburg.  +---------+---------------+---------+-----------+----------+--------------+ RIGHT    CompressibilityPhasicitySpontaneityPropertiesThrombus Aging +---------+---------------+---------+-----------+----------+--------------+ CFV      Full  Yes      Yes                                 +---------+---------------+---------+-----------+----------+--------------+ SFJ      Full                                                        +---------+---------------+---------+-----------+----------+--------------+ FV Prox  Full                                                        +---------+---------------+---------+-----------+----------+--------------+ FV Mid   Full                                                        +---------+---------------+---------+-----------+----------+--------------+ FV DistalFull                                                        +---------+---------------+---------+-----------+----------+--------------+ PFV      Full                                                        +---------+---------------+---------+-----------+----------+--------------+ POP      Full           Yes      Yes                                 +---------+---------------+---------+-----------+----------+--------------+ PTV      Full                                                         +---------+---------------+---------+-----------+----------+--------------+ PERO     Full                                                        +---------+---------------+---------+-----------+----------+--------------+   +---------+---------------+---------+-----------+----------+--------------+ LEFT     CompressibilityPhasicitySpontaneityPropertiesThrombus Aging +---------+---------------+---------+-----------+----------+--------------+ CFV      Full           Yes      Yes                                 +---------+---------------+---------+-----------+----------+--------------+ SFJ  Full                                                        +---------+---------------+---------+-----------+----------+--------------+ FV Prox  Full                                                        +---------+---------------+---------+-----------+----------+--------------+ FV Mid   Full                                                        +---------+---------------+---------+-----------+----------+--------------+ FV DistalFull                                                        +---------+---------------+---------+-----------+----------+--------------+ PFV      Full                                                        +---------+---------------+---------+-----------+----------+--------------+ POP      Full           Yes      Yes                                 +---------+---------------+---------+-----------+----------+--------------+ PTV      Full                                                        +---------+---------------+---------+-----------+----------+--------------+ PERO     Full                                                        +---------+---------------+---------+-----------+----------+--------------+     Summary: RIGHT: - There is no evidence of deep vein thrombosis in the lower extremity.  - No cystic structure found in  the popliteal fossa.  LEFT: - There is no evidence of deep vein thrombosis in the lower extremity.  - No cystic structure found in the popliteal fossa.  *See table(s) above for measurements and observations. Electronically signed by Servando Snare MD on 12/16/2019 at 6:38:07 PM.    Final     Scheduled Meds: . amLODipine  5 mg Oral Daily  . aspirin EC  81 mg Oral q morning - 10a  . atorvastatin  40 mg Oral q1800  . enoxaparin (LOVENOX) injection  40 mg  Subcutaneous QHS  . feeding supplement (GLUCERNA SHAKE)  237 mL Oral BID BM  . fluticasone  2 spray Each Nare Daily  . furosemide  80 mg Oral Daily  . hydrALAZINE  25 mg Oral BID  . insulin aspart  0-9 Units Subcutaneous TID WC  . insulin aspart  5 Units Subcutaneous TID WC  . insulin glargine  15 Units Subcutaneous QHS  . metoprolol tartrate  25 mg Oral BID  . multivitamin with minerals  1 tablet Oral Daily  . polyethylene glycol  17 g Oral BID  . sodium chloride flush  3 mL Intravenous Q12H   Continuous Infusions: . sodium chloride       LOS: 6 days   Time spent: 35 minutes.  Patrecia Pour, MD [facility] [email or web address] 12/17/2019, 11:15 AM

## 2019-12-17 NOTE — Progress Notes (Signed)
Physical Therapy Treatment Patient Details Name: [name] MRN: [id number] DOB: [birth year] Today's Date: 12/17/2019    History of Present Illness Pt is an 86 y.o. female admitted 12/10/19 with worsenign SOB and BLE edema, also hypoxic. Worked up for acute hypoxic respiratory failure secondary to decompensated HF. 2/11 pt with onset of bilateral foot/lower leg pain limiting ability to mobilize. PMH includes dCHF, CKD 3, COPD, HTN.   PT Comments    Pt with increased lethargy this morning, also with continued bilateral foot/lower leg pain. Pt opening eyes to verbal/tactile stimulus, but keeping eyes closed majority of session. MaxA+2 to [place] for bed mobility and attempts to stand to scale. Pt unable to achieve fully upright standing, actively resisting movement. Pt grimacing/guarding due to pain, also with tremulous/ataxic-like(?) BUE movement (RN present and aware - related to meds??). Based on current functional status, recommend SNF-level therapies although son had declined even HHPT. Hopefully pt able to progress back to ambulating with RW at supervision-level as she was a few days prior.   Follow Up Recommendations  SNF;Supervision/Assistance - 24 hour(son declining follow-up therapies)     Equipment Recommendations  None recommended by PT(owns RW and wheelchair)    Recommendations for Other Services       Precautions / Restrictions Precautions Precautions: Fall Precaution Comments: hx of falls Restrictions Weight Bearing Restrictions: No    Mobility  Bed Mobility Overal bed mobility: Needs Assistance Bed Mobility: Supine to Sit;Sit to Supine Rolling: Max assist;+2 for safety/equipment   Supine to sit: Total assist     General bed mobility comments: Pt initially following cues to sit EOB but then eyes closed, requiring maxA+2 for trunk elevation and scooting hips, pt actively resisting c/o BLE pain; totalA for return to supine and repositioning  Transfers Overall transfer  level: Needs assistance               General transfer comment: Multiple attempts of standing to scale with BUE support on scale and maxA+2 for trunk/hip elevation, pt able to minimally clear buttocks from EOB with maxA+2, actively resisting additional attempts, limited by pain and lethargy; unable to achieve fully upright posture  Ambulation/Gait                 Stairs             Wheelchair Mobility    Modified Rankin (Stroke Patients Only)       Balance Overall balance assessment: Needs assistance   Sitting balance-Leahy Scale: Poor                                      Cognition Arousal/Alertness: Lethargic;Suspect due to medications Behavior During Therapy: Anxious Overall Cognitive Status: No family/caregiver present to determine baseline cognitive functioning                                 General Comments: Lethargic, awakening to cues but eyes closed majority of session, difficulty following commands; "I don't understand why you all are doing this to me"      Exercises      General Comments General comments (skin integrity, edema, etc.): Difficulty gripping bilateral scale with tremulous/ataxic-like BUEs (L>R) - RN present and aware, RN thinking potentially due to neurotin - unable to get standing weight - bilateral feet painful to touch and c/o leg pain with all  movement      Pertinent Vitals/Pain Pain Assessment: Faces Faces Pain Scale: Hurts whole lot Pain Location: Bilateral feet Pain Descriptors / Indicators: Grimacing;Guarding;Moaning(pt unable to describe pain) Pain Intervention(s): Monitored during session;Limited activity within patient's tolerance    Home Living                      Prior Function            PT Goals (current goals can now be found in the care plan section) Progress towards PT goals: Not progressing toward goals - comment(pain and lethargy)    Frequency    Min  3X/week      PT Plan Discharge plan needs to be updated    Co-evaluation              AM-PAC PT "6 Clicks" Mobility   Outcome Measure  Help needed turning from your back to your side while in a flat bed without using bedrails?: Total Help needed moving from lying on your back to sitting on the side of a flat bed without using bedrails?: Total Help needed moving to and from a bed to a chair (including a wheelchair)?: Total Help needed standing up from a chair using your arms (e.g., wheelchair or bedside chair)?: Total Help needed to walk in hospital room?: Total Help needed climbing 3-5 steps with a railing? : Total 6 Click Score: 6    End of Session Equipment Utilized During Treatment: Oxygen Activity Tolerance: Patient limited by pain;Patient limited by lethargy Patient left: in bed;with call bell/phone within reach;with bed alarm set;with nursing/sitter in room Nurse Communication: Mobility status PT Visit Diagnosis: Unsteadiness on feet (R26.81);Other abnormalities of gait and mobility (R26.89);Muscle weakness (generalized) (M62.81);History of falling (Z91.81);Difficulty in walking, not elsewhere classified (R26.2)     Time: [id number] PT Time Calculation (min) (ACUTE ONLY): 17 min  Charges:  $Therapeutic Activity: 8-22 mins                     Mabeline Caras, PT, DPT Acute Rehabilitation Services  Pager [phone] Office [phone]  Derry Lory 12/17/2019, 9:09 AM

## 2019-12-18 LAB — COMPREHENSIVE METABOLIC PANEL
ALT: 24 U/L (ref 0–44)
AST: 25 U/L (ref 15–41)
Albumin: 2.3 g/dL — ABNORMAL LOW (ref 3.5–5.0)
Alkaline Phosphatase: 49 U/L (ref 38–126)
Anion gap: 13 (ref 5–15)
BUN: 53 mg/dL — ABNORMAL HIGH (ref 8–23)
CO2: 33 mmol/L — ABNORMAL HIGH (ref 22–32)
Calcium: 8.9 mg/dL (ref 8.9–10.3)
Chloride: 88 mmol/L — ABNORMAL LOW (ref 98–111)
Creatinine, Ser: 1.61 mg/dL — ABNORMAL HIGH (ref 0.44–1.00)
GFR calc Af Amer: 33 mL/min — ABNORMAL LOW (ref >60)
GFR calc non Af Amer: 29 mL/min — ABNORMAL LOW (ref >60)
Glucose, Bld: 211 mg/dL — ABNORMAL HIGH (ref 70–99)
Potassium: 4.9 mmol/L (ref 3.5–5.1)
Sodium: 134 mmol/L — ABNORMAL LOW (ref 135–145)
Total Bilirubin: 1 mg/dL (ref 0.3–1.2)
Total Protein: 5.5 g/dL — ABNORMAL LOW (ref 6.5–8.1)

## 2019-12-18 LAB — CBC WITH DIFFERENTIAL/PLATELET
Abs Immature Granulocytes: 0.07 10*3/uL (ref 0.00–0.07)
Basophils Absolute: 0 10*3/uL (ref 0.0–0.1)
Basophils Relative: 0 %
Eosinophils Absolute: 0 10*3/uL (ref 0.0–0.5)
Eosinophils Relative: 0 %
HCT: 36.1 % (ref 36.0–46.0)
Hemoglobin: 10.9 g/dL — ABNORMAL LOW (ref 12.0–15.0)
Immature Granulocytes: 1 %
Lymphocytes Relative: 3 %
Lymphs Abs: 0.5 10*3/uL — ABNORMAL LOW (ref 0.7–4.0)
MCH: 26.7 pg (ref 26.0–34.0)
MCHC: 30.2 g/dL (ref 30.0–36.0)
MCV: 88.3 fL (ref 80.0–100.0)
Monocytes Absolute: 0.2 10*3/uL (ref 0.1–1.0)
Monocytes Relative: 1 %
Neutro Abs: 13.6 10*3/uL — ABNORMAL HIGH (ref 1.7–7.7)
Neutrophils Relative %: 95 %
Platelets: 245 10*3/uL (ref 150–400)
RBC: 4.09 MIL/uL (ref 3.87–5.11)
RDW: 14.9 % (ref 11.5–15.5)
WBC: 14.3 10*3/uL — ABNORMAL HIGH (ref 4.0–10.5)
nRBC: 0 % (ref 0.0–0.2)

## 2019-12-18 LAB — GLUCOSE, CAPILLARY
Glucose-Capillary: 222 mg/dL — ABNORMAL HIGH (ref 70–99)
Glucose-Capillary: 364 mg/dL — ABNORMAL HIGH (ref 70–99)
Glucose-Capillary: 317 mg/dL — ABNORMAL HIGH (ref 70–99)
Glucose-Capillary: 340 mg/dL — ABNORMAL HIGH (ref 70–99)

## 2019-12-18 MED ORDER — INSULIN GLARGINE 100 UNIT/ML ~~LOC~~ SOLN
22.0000 [IU] | Freq: Every day | SUBCUTANEOUS | Status: DC
  Administered 2019-12-18: 22 [IU] via SUBCUTANEOUS
  Filled 2019-12-18 (×2): qty 0.22

## 2019-12-18 NOTE — Plan of Care (Signed)
?  Problem: Clinical Measurements: ?Goal: Will remain free from infection ?Outcome: Progressing ?  ?

## 2019-12-18 NOTE — Consult Note (Signed)
ORTHOPAEDIC CONSULTATION  REQUESTING PHYSICIAN: Benito Mccreedy, MD  PCP:  Hoyt Koch, MD  Chief Complaint: Right knee pain  HPI: [name] is a 86 y.o. female who complains of acute onset of right knee pain and swelling following a episode of Congestive heart failure exacerbation with subsequent diuresis and now a painful swollen right knee.  In discussion with the hospitalist service yesterday it did appear that she had some left knee pain initially.  I am evaluating her now about 12 hours after the initial onset with some resolution of pain following a dose of IV Solu-Medrol.  She does have a history of gout.  She states is been many years since that has happened.  But in the current state she describes right knee pain with active or passive motion through the knee but no fevers, night sweats or chills.  The left leg has no pain reported today.  She denies any numbness or tingling.  Otherwise she does live independently at baseline.  Past Medical History:  Diagnosis Date  . Anasarca 04/2009   found to be secondary to pericardial effusion with tamponade/pericardial window done  . Chronic diastolic CHF (congestive heart failure) ([place])   . CKD (chronic kidney disease), stage III   . COPD (chronic obstructive pulmonary disease) ([place])   . Coronary artery disease    a. mild-mod CAD 2004.  . Diabetes mellitus    insulin dependent  . Herpes labialis    Healing herpes labialis  . Hypercholesterolemia   . Hypertension   . Lumbar radiculopathy   . Lumbar scoliosis   . Morbid obesity ([place])   . Pericardial effusion 04/2009   a. 2010 with tamponade s/p window.  Marland Kitchen PONV (postoperative nausea and vomiting)    has not had any problems since 2001  . Spondylolisthesis   . Spondylosis    Past Surgical History:  Procedure Laterality Date  . BACK SURGERY     microdiskectomy L3-4 on L/partial facetectomy 3-4 on L/removal of synovial cyst on left L3-4  . BREAST BIOPSY     left  . CARDIAC CATHETERIZATION  04/25/2003   L heart cath w/coronary angiography/R femoral artery/ EF 65%/no mitral regurgitation/ angiography L main coronary artery smooth & normal  . EYE SURGERY     ophthalmoscopy/peritomy adjacent to the limbus from the 8 to 2:30 position superiorly  . lumbar laminectomy    . SUBXYPHOID PERICARDIAL WINDOW  04/2009   for pericardial effusion w/pericardial tamponade/sac drained w/20-French Baard drain/transesophageal echocardiogram confimed complete evacution of pericardial fluid  . TOTAL HIP ARTHROPLASTY  03/06/2012   Procedure: TOTAL HIP ARTHROPLASTY;  Surgeon: Kerin Salen, MD;  Location: [facility];  Service: Orthopedics;  Laterality: Left;  DEPUY/PINNACLE, SUMMIT STEM, CUP POLY & CERAMIC  . VAGINAL HYSTERECTOMY     Social History   Socioeconomic History  . Marital status: Widowed    Spouse name: Not on file  . Number of children: Not on file  . Years of education: Not on file  . Highest education level: Not on file  Occupational History  . Not on file  Tobacco Use  . Smoking status: Never Smoker  . Smokeless tobacco: Never Used  Substance and Sexual Activity  . Alcohol use: No  . Drug use: No  . Sexual activity: Not Currently  Other Topics Concern  . Not on file  Social History Narrative  . Not on file   Social Determinants of Health   Financial Resource Strain:   .  Difficulty of Paying Living Expenses: Not on file  Food Insecurity:   . Worried About Charity fundraiser in the Last Year: Not on file  . Ran Out of Food in the Last Year: Not on file  Transportation Needs:   . Lack of Transportation (Medical): Not on file  . Lack of Transportation (Non-Medical): Not on file  Physical Activity:   . Days of Exercise per Week: Not on file  . Minutes of Exercise per Session: Not on file  Stress:   . Feeling of Stress : Not on file  Social Connections:   . Frequency of Communication with Friends and Family: Not on file  . Frequency of  Social Gatherings with Friends and Family: Not on file  . Attends Religious Services: Not on file  . Active Member of Clubs or Organizations: Not on file  . Attends Archivist Meetings: Not on file  . Marital Status: Not on file   Family History  Problem Relation Age of Onset  . Heart attack Father   . Hypertension Father   . Stroke Mother   . Angina Mother   . Coronary artery disease Other    Allergies  Allergen Reactions  . Atenolol Other (See Comments)    weakness  . Chlorhexidine   . Codeine Nausea And Vomiting    Hydrocodone is ok  . Lactose Intolerance (Gi) Diarrhea  . Lodine [Etodolac] Other (See Comments)    dizziness  . Sulfa Drugs Cross Reactors Nausea Only  . Sulfamethoxazole Nausea And Vomiting  . Benzonatate Nausea And Vomiting  . Escitalopram Oxalate Other (See Comments)    mild hallucinations  . Metformin Nausea And Vomiting    Reaction to IR and XL  . Oxycodone-Acetaminophen Other (See Comments)    Percocet does not relieve pts' pain  . Pregabalin Other (See Comments)    Unknown reaction   Prior to Admission medications   Medication Sig Start Date End Date Taking? Authorizing Provider  acetaminophen (TYLENOL) 500 MG tablet Take 500 mg by mouth See admin instructions. Take one tablet (500 mg) by mouth twice daily, may also take one tablet (500 mg) mid-day as needed for pain/headache   Yes [provider]  amLODipine (NORVASC) 5 MG tablet TAKE ONE TABLET BY MOUTH DAILY Patient taking differently: Take 5 mg by mouth every morning.  08/23/19  Yes Nahser, Wonda Cheng, MD  aspirin EC 81 MG tablet Take 81 mg by mouth every morning.    Yes [provider]  atorvastatin (LIPITOR) 40 MG tablet Take 40 mg by mouth every morning.  12/31/18  Yes [provider]  ergocalciferol (VITAMIN D2) 50000 UNITS capsule Take 50,000 Units by mouth every Friday.  07/30/11  Yes [provider]  feeding supplement, GLUCERNA SHAKE, (GLUCERNA  SHAKE) LIQD Take 237 mLs by mouth 2 (two) times daily between meals. Patient taking differently: Take 237 mLs by mouth as needed (supplement diet).  09/24/19  Yes Regalado, Belkys A, MD  fluticasone (FLONASE) 50 MCG/ACT nasal spray Place 2 sprays into both nostrils daily. Patient taking differently: Place 2 sprays into both nostrils as needed for allergies (congestion).  10/15/16  Yes Tat, Shanon Brow, MD  furosemide (LASIX) 80 MG tablet TAKE 1 TABLET BY MOUTH EVERY DAY Patient taking differently: Take 80 mg by mouth daily.  08/09/19  Yes Nahser, Wonda Cheng, MD  gabapentin (NEURONTIN) 300 MG capsule 2 TABLETS BY MOUTH AT BEDTIME Patient taking differently: Take 600 mg by mouth at bedtime.  08/06/16  Yes Nahser, Wonda Cheng, MD  hydrALAZINE (APRESOLINE) 25 MG tablet TAKE 1 TABLET BY MOUTH TWICE A DAY Patient taking differently: Take 25 mg by mouth 2 (two) times daily.  07/21/19  Yes Nahser, Wonda Cheng, MD  insulin aspart (NOVOLOG) 100 UNIT/ML injection Inject 3-8 Units into the skin 3 (three) times daily with meals. Based on a sliding scale.   Yes [provider]  insulin glargine (LANTUS) 100 UNIT/ML injection Inject 20 Units into the skin at bedtime.    Yes [provider]  LACTASE PO Take 1 tablet by mouth 3 (three) times daily as needed (if meal includes cheese or other dairy).   Yes [provider]  metoprolol tartrate (LOPRESSOR) 25 MG tablet Take 25 mg by mouth 2 (two) times daily.   Yes [provider]  Multiple Vitamin (MULTIVITAMIN WITH MINERALS) TABS tablet Take 1 tablet by mouth daily.   Yes [provider]  ONE TOUCH ULTRA TEST test strip 1 each by Other route as needed for other (Insulin). Use as directed 01/13/19  Yes [provider]  Polyethyl Glycol-Propyl Glycol (SYSTANE) 0.4-0.3 % SOLN Apply 2 drops to eye daily as needed (dry eyes).    Yes [provider]  glipiZIDE (GLUCOTROL XL) 5 MG 24 hr tablet Take 5 mg by mouth daily. 10/15/19    [provider]  polyethylene glycol (MIRALAX / GLYCOLAX) 17 g packet Take 17 g by mouth 2 (two) times daily. Patient not taking: Reported on 12/11/2019 09/24/19   Elmarie Shiley, MD   DG CHEST PORT 1 VIEW  Result Date: 12/17/2019 CLINICAL DATA:  Acute respiratory failure with hypoxia, CHF EXAM: PORTABLE CHEST 1 VIEW COMPARISON:  12/10/2019 chest radiograph. FINDINGS: Stable cardiomediastinal silhouette with mild cardiomegaly. No pneumothorax. No pleural effusion. Lungs appear clear, with no acute consolidative airspace disease and no pulmonary edema. IMPRESSION: Stable mild cardiomegaly without pulmonary edema. No active pulmonary disease. Electronically Signed   By: Ilona Sorrel M.D.   On: 12/17/2019 11:44   VAS [place] LOWER EXTREMITY VENOUS (DVT)  Result Date: 12/16/2019  Lower Venous DVTStudy Indications: Elevated Ddimer.  Risk Factors: None identified. Limitations: Body habitus and poor ultrasound/tissue interface. Comparison Study: No prior studies. Performing Technologist: Oliver Hum RVT  Examination Guidelines: A complete evaluation includes B-mode imaging, spectral Doppler, color Doppler, and power Doppler as needed of all accessible portions of each vessel. Bilateral testing is considered an integral part of a complete examination. Limited examinations for reoccurring indications may be performed as noted. The reflux portion of the exam is performed with the patient in reverse Trendelenburg.  +---------+---------------+---------+-----------+----------+--------------+ RIGHT    CompressibilityPhasicitySpontaneityPropertiesThrombus Aging +---------+---------------+---------+-----------+----------+--------------+ CFV      Full           Yes      Yes                                 +---------+---------------+---------+-----------+----------+--------------+ SFJ      Full                                                         +---------+---------------+---------+-----------+----------+--------------+ FV Prox  Full                                                        +---------+---------------+---------+-----------+----------+--------------+  FV Mid   Full                                                        +---------+---------------+---------+-----------+----------+--------------+ FV DistalFull                                                        +---------+---------------+---------+-----------+----------+--------------+ PFV      Full                                                        +---------+---------------+---------+-----------+----------+--------------+ POP      Full           Yes      Yes                                 +---------+---------------+---------+-----------+----------+--------------+ PTV      Full                                                        +---------+---------------+---------+-----------+----------+--------------+ PERO     Full                                                        +---------+---------------+---------+-----------+----------+--------------+   +---------+---------------+---------+-----------+----------+--------------+ LEFT     CompressibilityPhasicitySpontaneityPropertiesThrombus Aging +---------+---------------+---------+-----------+----------+--------------+ CFV      Full           Yes      Yes                                 +---------+---------------+---------+-----------+----------+--------------+ SFJ      Full                                                        +---------+---------------+---------+-----------+----------+--------------+ FV Prox  Full                                                        +---------+---------------+---------+-----------+----------+--------------+ FV Mid   Full                                                         +---------+---------------+---------+-----------+----------+--------------+  FV DistalFull                                                        +---------+---------------+---------+-----------+----------+--------------+ PFV      Full                                                        +---------+---------------+---------+-----------+----------+--------------+ POP      Full           Yes      Yes                                 +---------+---------------+---------+-----------+----------+--------------+ PTV      Full                                                        +---------+---------------+---------+-----------+----------+--------------+ PERO     Full                                                        +---------+---------------+---------+-----------+----------+--------------+     Summary: RIGHT: - There is no evidence of deep vein thrombosis in the lower extremity.  - No cystic structure found in the popliteal fossa.  LEFT: - There is no evidence of deep vein thrombosis in the lower extremity.  - No cystic structure found in the popliteal fossa.  *See table(s) above for measurements and observations. Electronically signed by Servando Snare MD on 12/16/2019 at 6:38:07 PM.    Final     Positive ROS: All other systems have been reviewed and were otherwise negative with the exception of those mentioned in the HPI and as above.  Physical Exam: General: Alert, no acute distress Cardiovascular: No pedal edema Respiratory: No cyanosis, no use of accessory musculature GI: No organomegaly, abdomen is soft and non-tender Skin: No lesions in the area of chief complaint Neurologic: Sensation intact distally Psychiatric: Patient is competent for consent with normal mood and affect Lymphatic: No axillary or cervical lymphadenopathy  MUSCULOSKELETAL:  Left lower extremity:  She has just trace edema in the distal tibia and foot.  Nonpainful.  No erythema or warmth.   Painless range of motion throughout the knee, ankle and foot.    Right lower extremity:  She has a mild effusion about the knee with pain.  No warmth or erythema at this time.  Passively I can range her from 10 to 70 degrees.  Distally neurovascular intact just trace edema.  Assessment: 1.  Right knee gouty arthropathy.  Plan: -At this time following 1 dose of IV Solu-Medrol she does not seem to have clinical improvement.  I imagine with subsequent doses this will continue to be the case.  I would like to keep an eye on her though with another examination  tomorrow to assure that she does not have the same amount of pain tomorrow.  At that time I would likely need to proceed with aspiration for diagnostic purposes.  She is appropriate for range of motion weightbearing as tolerated.  -Appropriate for weightbearing as tolerated and out of bed as able.  I will reevaluate her tomorrow.    Nicholes Stairs, MD Cell [phone]    12/18/2019 9:35 AM

## 2019-12-18 NOTE — Progress Notes (Signed)
PROGRESS NOTE  RAHWA BAZ  [id number] DOB: [birth year] DOA: 12/10/2019 PCP: Hoyt Koch, MD   Brief Narrative: HPI on 12/11/2019 by Dr. Pearson Forster Pughis a 86 y.o.femalewith medical history significant ofdCHF, CKD stage 3, DM2, HTN, COPD.  She presented with few days history of slightly increased SOB and BLE edema associated with occasional cough and hypoxia with SPO2 in the 70s, without any fever or chills.  Patient was admitted with acute hypoxic respiratory failure secondary to CHF exacerbation and treated with diuresis/supportive care.   On 2/11 the patient complained of severe bilateral feet/leg pain consistent with peripheral neuropathy-ultrasound of lower extremities was negative for DVT-gabapentin dose was increased initially but this was cut back on account of associated lethargy with increasing creatinine level.  Assessment & Plan: Principal Problem:   Acute respiratory failure with hypoxia (HCC) Active Problems:   Acute on chronic diastolic CHF (congestive heart failure) (HCC)   Diabetic polyneuropathy associated with type 2 diabetes mellitus (HCC)   Benign hypertension with CKD (chronic kidney disease) stage III   Acute lower UTI  Acute hypoxic respiratory failure secondary to acute on chronic HFpEF:  -Hypoxemia persists though she had no home oxygen. Echocardiogram shows an EF of 60 to 65%, elevated LV end-diastolic pressure, LV diastolic parameters consistent with grade 1 diastolic dysfunction -Good response to diuresis with monitoring of electrolytes and renal function, as well as monitoring of daily weights with I/O's monitoring  Mental status change Improved Patient was lethargic initially Likely due to UTI with acute kidney injury TSH 0.347 Free T4 0.98  Ammonia level 20   E. coli UTI Urine culture grew E. coli History of frequent recurrence and WBC Treated with ceftriaxone (pansensitive) Urine culture repeated 12/17/2019-no growth  to date-follow clinically  AKI on chronic kidney disease, stage IIIb: Cr slightly elevated today in setting of diuresis with rising BUN and contraction alkalosis.  - Decrease diuretic, continue monitoring in AM - Avoid other nephrotoxins and continue close attention to CrCl with gabapentin dosing once reinitiated.   Essential hypertension - Continue amlodipine, hydralazine  IDT2DM HbA1c 6.7%.  Glycemic control with insulin  Diabetic peripheral neuropathy:  Gabapentin and tramadol on held 12/17/2019 due to lethargy   Chronic gout:  Complain of acute right knee pain responded well to steroids Orthopedic notes reviewed and noted  Obesity: BMI 33. - Patient to follow-up with PCP to discuss lifestyle modifications  RN Pressure Injury Documentation: Pressure Injury 09/20/19 Buttocks Right Stage II -  Partial thickness loss of dermis presenting as a shallow open ulcer with a red, pink wound bed without slough. (Active)  09/20/19 2300  Location: Buttocks  Location Orientation: Right  Staging: Stage II -  Partial thickness loss of dermis presenting as a shallow open ulcer with a red, pink wound bed without slough.  Wound Description (Comments):   Present on Admission: Yes   DVT prophylaxis: Lovenox Code Status: DNR Family Communication: . Disposition Plan: TBD  Consultants:   None  Procedures:   None  Antimicrobials:  Ceftriaxone   Subjective: Patient is very bright this morning, denies any fever or chills, pain is a lot better.  Objective: Vitals:   12/17/19 2212 12/17/19 2214 12/18/19 0356 12/18/19 0904  BP: (!) 149/56 (!) 149/56  (!) 122/57  Pulse: 80 80  75  Resp:    17  Temp:  98.2 F (36.8 C)  98.4 F (36.9 C)  TempSrc:  Axillary  Oral  SpO2:  95%  95%  Weight:  88 kg   Height:        Intake/Output Summary (Last 24 hours) at 12/18/2019 1509 Last data filed at 12/18/2019 0500 Gross per 24 hour  Intake 3 ml  Output 1101 ml  Net -1098 ml   Filed  Weights   12/15/19 0300 12/16/19 1804 12/18/19 0356  Weight: 88.2 kg 65.3 kg 88 kg   Gen: Comfortable, no acute distress on O2 by nasal cannula Pulm: Clear to auscultation bilaterally CV: Regular rate and rhythm. No murmur, rub, or gallop. No JVD, no pitting dependent edema. GI: Abdomen soft, non-tender, non-distended, with normoactive bowel sounds.  Ext: No cyanosis, trace edema  Neuro: Alert and responsive, conversational, oriented x2, no obvious acute focal deficit  Data Reviewed: I have personally reviewed following labs and imaging studies  CBC: Recent Labs  Lab 12/14/19 0317 12/15/19 0227 12/17/19 0151 12/18/19 0723  WBC 8.3  --  14.8* 14.3*  NEUTROABS  --   --   --  13.6*  HGB 10.9* 11.6* 11.6* 10.9*  HCT 37.0 39.0 38.7 36.1  MCV 91.8  --  90.0 88.3  PLT 263  --  248 [id number]   Basic Metabolic Panel: Recent Labs  Lab 12/14/19 0317 12/15/19 0227 12/16/19 0849 12/17/19 0151 12/18/19 0723  NA 143 142 142 138 134*  K 4.2 4.2 4.6 4.6 4.9  CL 97* 96* 95* 90* 88*  CO2 35* 39* 35* 36* 33*  GLUCOSE 145* 103* 101* 150* 211*  BUN 28* 28* 30* 33* 53*  CREATININE 1.11* 1.23* 1.22* 1.35* 1.61*  CALCIUM 8.9 9.1 9.0 8.7* 8.9   GFR: Estimated Creatinine Clearance: 26.9 mL/min (A) (by C-G formula based on SCr of 1.61 mg/dL (H)). Liver Function Tests: Recent Labs  Lab 12/18/19 0723  AST 25  ALT 24  ALKPHOS 49  BILITOT 1.0  PROT 5.5*  ALBUMIN 2.3*   No results for input(s): LIPASE, AMYLASE in the last 168 hours. Recent Labs  Lab 12/17/19 1215  AMMONIA 20   Coagulation Profile: No results for input(s): INR, PROTIME in the last 168 hours. Cardiac Enzymes: No results for input(s): CKTOTAL, CKMB, CKMBINDEX, TROPONINI in the last 168 hours. BNP (last 3 results) No results for input(s): PROBNP in the last 8760 hours. HbA1C: No results for input(s): HGBA1C in the last 72 hours. CBG: Recent Labs  Lab 12/17/19 1249 12/17/19 1711 12/17/19 2108 12/18/19 0752  12/18/19 1246  GLUCAP 118* 163* 185* 222* 364*   Lipid Profile: No results for input(s): CHOL, HDL, LDLCALC, TRIG, CHOLHDL, LDLDIRECT in the last 72 hours. Thyroid Function Tests: Recent Labs    12/17/19 1215  TSH 0.347*  FREET4 0.98   Anemia Panel: Recent Labs    12/17/19 1215  VITAMINB12 471   Urine analysis:    Component Value Date/Time   COLORURINE YELLOW 12/17/2019 1900   APPEARANCEUR CLEAR 12/17/2019 1900   LABSPEC 1.012 12/17/2019 1900   PHURINE 5.0 12/17/2019 1900   GLUCOSEU NEGATIVE 12/17/2019 1900   HGBUR NEGATIVE 12/17/2019 1900   BILIRUBINUR NEGATIVE 12/17/2019 1900   BILIRUBINUR Neg 08/06/2018 1540   KETONESUR NEGATIVE 12/17/2019 1900   PROTEINUR NEGATIVE 12/17/2019 1900   UROBILINOGEN 0.2 08/06/2018 1540   UROBILINOGEN Normal 07/18/2017 0000   NITRITE NEGATIVE 12/17/2019 1900   LEUKOCYTESUR NEGATIVE 12/17/2019 1900   Recent Results (from the past 240 hour(s))  Respiratory Panel by RT PCR (Flu A&B, Covid) - Nasopharyngeal Swab     Status: None   Collection Time: 12/10/19 11:00 PM   Specimen: Nasopharyngeal  Swab  Result Value Ref Range Status   SARS Coronavirus 2 by RT PCR NEGATIVE NEGATIVE Final    Comment: (NOTE) SARS-CoV-2 target nucleic acids are NOT DETECTED. The SARS-CoV-2 RNA is generally detectable in upper respiratoy specimens during the acute phase of infection. The lowest concentration of SARS-CoV-2 viral copies this assay can detect is 131 copies/mL. A negative result does not preclude SARS-Cov-2 infection and should not be used as the sole basis for treatment or other patient management decisions. A negative result may occur with  improper specimen collection/handling, submission of specimen other than nasopharyngeal swab, presence of viral mutation(s) within the areas targeted by this assay, and inadequate number of viral copies (<131 copies/mL). A negative result must be combined with clinical observations, patient history, and  epidemiological information. The expected result is Negative. Fact Sheet for Patients:  PinkCheek.be Fact Sheet for Healthcare Providers:  GravelBags.it This test is not yet ap proved or cleared by the [place] FDA and  has been authorized for detection and/or diagnosis of SARS-CoV-2 by FDA under an Emergency Use Authorization (EUA). This EUA will remain  in effect (meaning this test can be used) for the duration of the COVID-19 declaration under Section 564(b)(1) of the Act, 21 U.S.C. section 360bbb-3(b)(1), unless the authorization is terminated or revoked sooner.    Influenza A by PCR NEGATIVE NEGATIVE Final   Influenza B by PCR NEGATIVE NEGATIVE Final    Comment: (NOTE) The Xpert Xpress SARS-CoV-2/FLU/RSV assay is intended as an aid in  the diagnosis of influenza from Nasopharyngeal swab specimens and  should not be used as a sole basis for treatment. Nasal washings and  aspirates are unacceptable for Xpert Xpress SARS-CoV-2/FLU/RSV  testing. Fact Sheet for Patients: PinkCheek.be Fact Sheet for Healthcare Providers: GravelBags.it This test is not yet approved or cleared by the [place] FDA and  has been authorized for detection and/or diagnosis of SARS-CoV-2 by  FDA under an Emergency Use Authorization (EUA). This EUA will remain  in effect (meaning this test can be used) for the duration of the  Covid-19 declaration under Section 564(b)(1) of the Act, 21  U.S.C. section 360bbb-3(b)(1), unless the authorization is  terminated or revoked. Performed at [facility], [place] [address], [place], [place] [address]   Culture, Urine     Status: Abnormal   Collection Time: 12/11/19  1:38 AM   Specimen: Urine, Random  Result Value Ref Range Status   Specimen Description URINE, RANDOM  Final   Special Requests   Final    NONE Performed at [facility], [place] [address] [place], [place] [address]    Culture >=100,000 COLONIES/mL ESCHERICHIA COLI (A)  Final   Report Status 12/13/2019 FINAL  Final   Organism ID, Bacteria ESCHERICHIA COLI (A)  Final      Susceptibility   Escherichia coli - MIC*    AMPICILLIN <=2 SENSITIVE Sensitive     CEFAZOLIN <=4 SENSITIVE Sensitive     CEFTRIAXONE <=0.25 SENSITIVE Sensitive     CIPROFLOXACIN <=0.25 SENSITIVE Sensitive     GENTAMICIN <=1 SENSITIVE Sensitive     IMIPENEM <=0.25 SENSITIVE Sensitive     NITROFURANTOIN <=16 SENSITIVE Sensitive     TRIMETH/SULFA <=20 SENSITIVE Sensitive     AMPICILLIN/SULBACTAM <=2 SENSITIVE Sensitive     PIP/TAZO <=4 SENSITIVE Sensitive     * >=100,000 COLONIES/mL ESCHERICHIA COLI      Radiology Studies: DG CHEST PORT 1 VIEW  Result Date: 12/17/2019 CLINICAL DATA:  Acute respiratory failure with hypoxia, CHF EXAM: PORTABLE CHEST 1 VIEW COMPARISON:  12/10/2019 chest radiograph. FINDINGS: Stable cardiomediastinal silhouette with mild cardiomegaly. No pneumothorax. No pleural effusion. Lungs appear clear, with no acute consolidative airspace disease and no pulmonary edema. IMPRESSION: Stable mild cardiomegaly without pulmonary edema. No active pulmonary disease. Electronically Signed   By: Ilona Sorrel M.D.   On: 12/17/2019 11:44    Scheduled Meds: . allopurinol  50 mg Oral Daily  . amLODipine  5 mg Oral Daily  . aspirin EC  81 mg Oral q morning - 10a  . atorvastatin  40 mg Oral q1800  . enoxaparin (LOVENOX) injection  30 mg Subcutaneous QHS  . feeding supplement (GLUCERNA SHAKE)  237 mL Oral BID BM  . fluticasone  2 spray Each Nare Daily  . furosemide  80 mg Oral Daily  . hydrALAZINE  25 mg Oral BID  . insulin aspart  0-9 Units Subcutaneous TID WC  . insulin aspart  5 Units Subcutaneous TID WC  . insulin glargine  15 Units Subcutaneous QHS  . methylPREDNISolone (SOLU-MEDROL) injection  40 mg Intravenous Daily  . metoprolol tartrate  25 mg Oral BID  .  multivitamin with minerals  1 tablet Oral Daily  . polyethylene glycol  17 g Oral BID  . sodium chloride flush  3 mL Intravenous Q12H   Continuous Infusions: . sodium chloride       LOS: 7 days   Time spent: 35 minutes.  Benito Mccreedy, MD [facility] [email or web address] 12/18/2019, 3:09 PM

## 2019-12-19 LAB — COMPREHENSIVE METABOLIC PANEL
ALT: 58 U/L — ABNORMAL HIGH (ref 0–44)
AST: 68 U/L — ABNORMAL HIGH (ref 15–41)
Albumin: 2.3 g/dL — ABNORMAL LOW (ref 3.5–5.0)
Alkaline Phosphatase: 48 U/L (ref 38–126)
Anion gap: 13 (ref 5–15)
BUN: 71 mg/dL — ABNORMAL HIGH (ref 8–23)
CO2: 33 mmol/L — ABNORMAL HIGH (ref 22–32)
Calcium: 8.8 mg/dL — ABNORMAL LOW (ref 8.9–10.3)
Chloride: 84 mmol/L — ABNORMAL LOW (ref 98–111)
Creatinine, Ser: 1.6 mg/dL — ABNORMAL HIGH (ref 0.44–1.00)
GFR calc Af Amer: 33 mL/min — ABNORMAL LOW (ref >60)
GFR calc non Af Amer: 29 mL/min — ABNORMAL LOW (ref >60)
Glucose, Bld: 296 mg/dL — ABNORMAL HIGH (ref 70–99)
Potassium: 4.8 mmol/L (ref 3.5–5.1)
Sodium: 130 mmol/L — ABNORMAL LOW (ref 135–145)
Total Bilirubin: 0.7 mg/dL (ref 0.3–1.2)
Total Protein: 5.4 g/dL — ABNORMAL LOW (ref 6.5–8.1)

## 2019-12-19 LAB — GLUCOSE, CAPILLARY
Glucose-Capillary: 320 mg/dL — ABNORMAL HIGH (ref 70–99)
Glucose-Capillary: 275 mg/dL — ABNORMAL HIGH (ref 70–99)
Glucose-Capillary: 314 mg/dL — ABNORMAL HIGH (ref 70–99)
Glucose-Capillary: 418 mg/dL — ABNORMAL HIGH (ref 70–99)

## 2019-12-19 LAB — CBC
HCT: 34 % — ABNORMAL LOW (ref 36.0–46.0)
Hemoglobin: 10.5 g/dL — ABNORMAL LOW (ref 12.0–15.0)
MCH: 26.9 pg (ref 26.0–34.0)
MCHC: 30.9 g/dL (ref 30.0–36.0)
MCV: 87.2 fL (ref 80.0–100.0)
Platelets: 270 10*3/uL (ref 150–400)
RBC: 3.9 MIL/uL (ref 3.87–5.11)
RDW: 14.4 % (ref 11.5–15.5)
WBC: 13.2 10*3/uL — ABNORMAL HIGH (ref 4.0–10.5)
nRBC: 0 % (ref 0.0–0.2)

## 2019-12-19 LAB — URINE CULTURE: Culture: NO GROWTH

## 2019-12-19 MED ORDER — INSULIN GLARGINE 100 UNIT/ML ~~LOC~~ SOLN
30.0000 [IU] | Freq: Every day | SUBCUTANEOUS | Status: DC
  Administered 2019-12-19 – 2019-12-23 (×5): 30 [IU] via SUBCUTANEOUS
  Filled 2019-12-19 (×7): qty 0.3

## 2019-12-19 NOTE — Plan of Care (Signed)
  Problem: Education: Goal: Knowledge of General Education information will improve Description: Including pain rating scale, medication(s)/side effects and non-pharmacologic comfort measures Outcome: Progressing   Problem: Health Behavior/Discharge Planning: Goal: Ability to manage health-related needs will improve Outcome: Progressing   Problem: Clinical Measurements: Goal: Will remain free from infection Outcome: Progressing Goal: Diagnostic test results will improve Outcome: Progressing Goal: Respiratory complications will improve Outcome: Progressing Goal: Cardiovascular complication will be avoided Outcome: Progressing   Problem: Activity: Goal: Risk for activity intolerance will decrease Outcome: Progressing   Problem: Elimination: Goal: Will not experience complications related to bowel motility Outcome: Progressing   Problem: Pain Managment: Goal: General experience of comfort will improve Outcome: Progressing   Problem: Safety: Goal: Ability to remain free from injury will improve Outcome: Progressing   Problem: Skin Integrity: Goal: Risk for impaired skin integrity will decrease Outcome: Progressing   Problem: Education: Goal: Ability to demonstrate management of disease process will improve Outcome: Progressing Goal: Ability to verbalize understanding of medication therapies will improve Outcome: Progressing Goal: Individualized Educational Video(s) Outcome: Progressing   Problem: Activity: Goal: Capacity to carry out activities will improve Outcome: Progressing   Problem: Cardiac: Goal: Ability to achieve and maintain adequate cardiopulmonary perfusion will improve Outcome: Progressing

## 2019-12-19 NOTE — Progress Notes (Signed)
PROGRESS NOTE  [name]  [id number] DOB: 10-Mar-1931 DOA: 12/10/2019 PCP: Hoyt Koch, MD   Brief Narrative: HPI on 12/11/2019 by Dr. Pearson Forster Pughis a 86 y.o.femalewith medical history significant ofdCHF, CKD stage 3, DM2, HTN, COPD.  She presented with few days history of slightly increased SOB and BLE edema associated with occasional cough and hypoxia with SPO2 in the 70s, without any fever or chills.  Patient was admitted with acute hypoxic respiratory failure secondary to CHF exacerbation and treated with diuresis/supportive care.   On 2/11 the patient complained of severe bilateral feet/leg pain consistent with peripheral neuropathy-ultrasound of lower extremities was negative for DVT-gabapentin dose was increased initially but this was cut back on account of associated lethargy with increasing creatinine level.  Assessment & Plan: Principal Problem:   Acute respiratory failure with hypoxia (HCC) Active Problems:   Acute on chronic diastolic CHF (congestive heart failure) (HCC)   Diabetic polyneuropathy associated with type 2 diabetes mellitus (HCC)   Benign hypertension with CKD (chronic kidney disease) stage III   Acute lower UTI  Acute hypoxic respiratory failure secondary to acute on chronic HFpEF:  -Hypoxemia persists though she had no home oxygen. Echocardiogram shows an EF of 60 to 65%, elevated LV end-diastolic pressure, LV diastolic parameters consistent with grade 1 diastolic dysfunction -Good response to diuresis with monitoring of electrolytes and renal function, as well as monitoring of daily weights with I/O's monitoring  Mental status change Improving Patient was lethargic initially Likely due to UTI with acute kidney injury TSH 0.347 Free T4 0.98  Ammonia level 20   E. coli UTI Urine culture grew E. coli History of frequent recurrence and WBC Treated with ceftriaxone (pansensitive) Urine culture repeated 12/17/2019-no growth  to date-follow clinically  AKI on chronic kidney disease, stage IIIb: Cr slightly elevated today in setting of diuresis with rising BUN and contraction alkalosis.  - Decrease diuretic, continue monitoring in AM - Avoid other nephrotoxins and continue close attention to CrCl with gabapentin dosing once reinitiated.   Essential hypertension - Continue amlodipine, hydralazine  IDT2DM HbA1c 6.7%.  Glycemic control with insulin  Diabetic peripheral neuropathy:  Gabapentin and tramadol on held 12/17/2019 due to lethargy   Chronic gout:  Complain of acute right knee pain responded well to steroids Orthopedic notes reviewed and noted  Obesity: BMI 33. - Patient to follow-up with PCP to discuss lifestyle modifications  RN Pressure Injury Documentation: Pressure Injury 09/20/19 Buttocks Right Stage II -  Partial thickness loss of dermis presenting as a shallow open ulcer with a red, pink wound bed without slough. (Active)  09/20/19 2300  Location: Buttocks  Location Orientation: Right  Staging: Stage II -  Partial thickness loss of dermis presenting as a shallow open ulcer with a red, pink wound bed without slough.  Wound Description (Comments):   Present on Admission: Yes   DVT prophylaxis: Lovenox Code Status: DNR Family Communication: . Disposition Plan: TBD  Consultants:   None  Procedures:   None  Antimicrobials:  Ceftriaxone   Subjective: No new complaints.  No acute events overnight.  Objective: Vitals:   12/18/19 0904 12/18/19 1719 12/18/19 2200 12/19/19 0803  BP: (!) 122/57 (!) 131/52 (!) 120/50 (!) 126/41  Pulse: 75  70 66  Resp: 17 18 18 16   Temp: 98.4 F (36.9 C) 97.9 F (36.6 C) 98 F (36.7 C) 99.1 F (37.3 C)  TempSrc: Oral Oral Oral   SpO2: 95% 97% 93% 98%  Weight:  Height:        Intake/Output Summary (Last 24 hours) at 12/19/2019 1341 Last data filed at 12/19/2019 0400 Gross per 24 hour  Intake 280 ml  Output 1300 ml  Net -1020 ml    Filed Weights   12/15/19 0300 12/16/19 1804 12/18/19 0356  Weight: 88.2 kg 65.3 kg 88 kg   Gen: Comfortable, no acute distress on O2 by nasal cannula Pulm: Clear to auscultation bilaterally CV: Regular rate and rhythm. No murmur, rub, or gallop. No JVD, no pitting dependent edema. GI: Abdomen soft, non-tender, non-distended, with normoactive bowel sounds.  Ext: No cyanosis, trace edema  Neuro: Alert and responsive, conversational, oriented x2, no obvious acute focal deficit  Data Reviewed: I have personally reviewed following labs and imaging studies  CBC: Recent Labs  Lab 12/14/19 0317 12/15/19 0227 12/17/19 0151 12/18/19 0723 12/19/19 0330  WBC 8.3  --  14.8* 14.3* 13.2*  NEUTROABS  --   --   --  13.6*  --   HGB 10.9* 11.6* 11.6* 10.9* 10.5*  HCT 37.0 39.0 38.7 36.1 34.0*  MCV 91.8  --  90.0 88.3 87.2  PLT 263  --  248 245 [id number]   Basic Metabolic Panel: Recent Labs  Lab 12/15/19 0227 12/16/19 0849 12/17/19 0151 12/18/19 0723 12/19/19 0330  NA 142 142 138 134* 130*  K 4.2 4.6 4.6 4.9 4.8  CL 96* 95* 90* 88* 84*  CO2 39* 35* 36* 33* 33*  GLUCOSE 103* 101* 150* 211* 296*  BUN 28* 30* 33* 53* 71*  CREATININE 1.23* 1.22* 1.35* 1.61* 1.60*  CALCIUM 9.1 9.0 8.7* 8.9 8.8*   GFR: Estimated Creatinine Clearance: 27.1 mL/min (A) (by C-G formula based on SCr of 1.6 mg/dL (H)). Liver Function Tests: Recent Labs  Lab 12/18/19 0723 12/19/19 0330  AST 25 68*  ALT 24 58*  ALKPHOS 49 48  BILITOT 1.0 0.7  PROT 5.5* 5.4*  ALBUMIN 2.3* 2.3*   No results for input(s): LIPASE, AMYLASE in the last 168 hours. Recent Labs  Lab 12/17/19 1215  AMMONIA 20   Coagulation Profile: No results for input(s): INR, PROTIME in the last 168 hours. Cardiac Enzymes: No results for input(s): CKTOTAL, CKMB, CKMBINDEX, TROPONINI in the last 168 hours. BNP (last 3 results) No results for input(s): PROBNP in the last 8760 hours. HbA1C: No results for input(s): HGBA1C in the last 72  hours. CBG: Recent Labs  Lab 12/18/19 1246 12/18/19 1648 12/18/19 2102 12/19/19 0802 12/19/19 1150  GLUCAP 364* 317* 340* 320* 275*   Lipid Profile: No results for input(s): CHOL, HDL, LDLCALC, TRIG, CHOLHDL, LDLDIRECT in the last 72 hours. Thyroid Function Tests: Recent Labs    12/17/19 1215  TSH 0.347*  FREET4 0.98   Anemia Panel: Recent Labs    12/17/19 1215  VITAMINB12 471   Urine analysis:    Component Value Date/Time   COLORURINE YELLOW 12/17/2019 1900   APPEARANCEUR CLEAR 12/17/2019 1900   LABSPEC 1.012 12/17/2019 1900   PHURINE 5.0 12/17/2019 1900   GLUCOSEU NEGATIVE 12/17/2019 1900   HGBUR NEGATIVE 12/17/2019 1900   BILIRUBINUR NEGATIVE 12/17/2019 1900   BILIRUBINUR Neg 08/06/2018 1540   KETONESUR NEGATIVE 12/17/2019 1900   PROTEINUR NEGATIVE 12/17/2019 1900   UROBILINOGEN 0.2 08/06/2018 1540   UROBILINOGEN Normal 07/18/2017 0000   NITRITE NEGATIVE 12/17/2019 1900   LEUKOCYTESUR NEGATIVE 12/17/2019 1900   Recent Results (from the past 240 hour(s))  Respiratory Panel by RT PCR (Flu A&B, Covid) - Nasopharyngeal Swab  Status: None   Collection Time: 12/10/19 11:00 PM   Specimen: Nasopharyngeal Swab  Result Value Ref Range Status   SARS Coronavirus 2 by RT PCR NEGATIVE NEGATIVE Final    Comment: (NOTE) SARS-CoV-2 target nucleic acids are NOT DETECTED. The SARS-CoV-2 RNA is generally detectable in upper respiratoy specimens during the acute phase of infection. The lowest concentration of SARS-CoV-2 viral copies this assay can detect is 131 copies/mL. A negative result does not preclude SARS-Cov-2 infection and should not be used as the sole basis for treatment or other patient management decisions. A negative result may occur with  improper specimen collection/handling, submission of specimen other than nasopharyngeal swab, presence of viral mutation(s) within the areas targeted by this assay, and inadequate number of viral copies (<131 copies/mL).  A negative result must be combined with clinical observations, patient history, and epidemiological information. The expected result is Negative. Fact Sheet for Patients:  PinkCheek.be Fact Sheet for Healthcare Providers:  GravelBags.it This test is not yet ap proved or cleared by the [place] FDA and  has been authorized for detection and/or diagnosis of SARS-CoV-2 by FDA under an Emergency Use Authorization (EUA). This EUA will remain  in effect (meaning this test can be used) for the duration of the COVID-19 declaration under Section 564(b)(1) of the Act, 21 U.S.C. section 360bbb-3(b)(1), unless the authorization is terminated or revoked sooner.    Influenza A by PCR NEGATIVE NEGATIVE Final   Influenza B by PCR NEGATIVE NEGATIVE Final    Comment: (NOTE) The Xpert Xpress SARS-CoV-2/FLU/RSV assay is intended as an aid in  the diagnosis of influenza from Nasopharyngeal swab specimens and  should not be used as a sole basis for treatment. Nasal washings and  aspirates are unacceptable for Xpert Xpress SARS-CoV-2/FLU/RSV  testing. Fact Sheet for Patients: PinkCheek.be Fact Sheet for Healthcare Providers: GravelBags.it This test is not yet approved or cleared by the [place] FDA and  has been authorized for detection and/or diagnosis of SARS-CoV-2 by  FDA under an Emergency Use Authorization (EUA). This EUA will remain  in effect (meaning this test can be used) for the duration of the  Covid-19 declaration under Section 564(b)(1) of the Act, 21  U.S.C. section 360bbb-3(b)(1), unless the authorization is  terminated or revoked. Performed at [facility], [place] [address], [place], [place] [address]   Culture, Urine     Status: Abnormal   Collection Time: 12/11/19  1:38 AM   Specimen: Urine, Random  Result Value Ref Range Status   Specimen Description  URINE, RANDOM  Final   Special Requests   Final    NONE Performed at [facility], [place] [address], [place],  [address]    Culture >=100,000 COLONIES/mL ESCHERICHIA COLI (A)  Final   Report Status 12/13/2019 FINAL  Final   Organism ID, Bacteria ESCHERICHIA COLI (A)  Final      Susceptibility   Escherichia coli - MIC*    AMPICILLIN <=2 SENSITIVE Sensitive     CEFAZOLIN <=4 SENSITIVE Sensitive     CEFTRIAXONE <=0.25 SENSITIVE Sensitive     CIPROFLOXACIN <=0.25 SENSITIVE Sensitive     GENTAMICIN <=1 SENSITIVE Sensitive     IMIPENEM <=0.25 SENSITIVE Sensitive     NITROFURANTOIN <=16 SENSITIVE Sensitive     TRIMETH/SULFA <=20 SENSITIVE Sensitive     AMPICILLIN/SULBACTAM <=2 SENSITIVE Sensitive     PIP/TAZO <=4 SENSITIVE Sensitive     * >=100,000 COLONIES/mL ESCHERICHIA COLI  Culture, Urine  Status: None   Collection Time: 12/17/19  7:00 PM   Specimen: Urine, Random  Result Value Ref Range Status   Specimen Description URINE, RANDOM  Final   Special Requests NONE  Final   Culture   Final    NO GROWTH Performed at [facility], [address], [place], [place] [address]    Report Status 12/19/2019 FINAL  Final      Radiology Studies: No results found.  Scheduled Meds: . allopurinol  50 mg Oral Daily  . amLODipine  5 mg Oral Daily  . aspirin EC  81 mg Oral q morning - 10a  . atorvastatin  40 mg Oral q1800  . enoxaparin (LOVENOX) injection  30 mg Subcutaneous QHS  . feeding supplement (GLUCERNA SHAKE)  237 mL Oral BID BM  . fluticasone  2 spray Each Nare Daily  . furosemide  80 mg Oral Daily  . hydrALAZINE  25 mg Oral BID  . insulin aspart  0-9 Units Subcutaneous TID WC  . insulin aspart  5 Units Subcutaneous TID WC  . insulin glargine  22 Units Subcutaneous QHS  . methylPREDNISolone (SOLU-MEDROL) injection  40 mg Intravenous Daily  . metoprolol tartrate  25 mg Oral BID  . multivitamin with minerals  1 tablet Oral Daily  . polyethylene glycol  17 g  Oral BID  . sodium chloride flush  3 mL Intravenous Q12H   Continuous Infusions: . sodium chloride       LOS: 8 days   Time spent: 25 minutes.  Benito Mccreedy, MD [facility] [email or web address] 12/19/2019, 1:41 PM

## 2019-12-20 DIAGNOSIS — M79671 Pain in right foot: Secondary | ICD-10-CM

## 2019-12-20 DIAGNOSIS — M79672 Pain in left foot: Secondary | ICD-10-CM

## 2019-12-20 DIAGNOSIS — N19 Unspecified kidney failure: Secondary | ICD-10-CM

## 2019-12-20 LAB — CBC
HCT: 34.7 % — ABNORMAL LOW (ref 36.0–46.0)
Hemoglobin: 10.7 g/dL — ABNORMAL LOW (ref 12.0–15.0)
MCH: 26.8 pg (ref 26.0–34.0)
MCHC: 30.8 g/dL (ref 30.0–36.0)
MCV: 87 fL (ref 80.0–100.0)
Platelets: 305 10*3/uL (ref 150–400)
RBC: 3.99 MIL/uL (ref 3.87–5.11)
RDW: 14.3 % (ref 11.5–15.5)
WBC: 10.6 10*3/uL — ABNORMAL HIGH (ref 4.0–10.5)
nRBC: 0 % (ref 0.0–0.2)

## 2019-12-20 LAB — COMPREHENSIVE METABOLIC PANEL
ALT: 139 U/L — ABNORMAL HIGH (ref 0–44)
AST: 111 U/L — ABNORMAL HIGH (ref 15–41)
Albumin: 2.3 g/dL — ABNORMAL LOW (ref 3.5–5.0)
Alkaline Phosphatase: 48 U/L (ref 38–126)
Anion gap: 13 (ref 5–15)
BUN: 81 mg/dL — ABNORMAL HIGH (ref 8–23)
CO2: 35 mmol/L — ABNORMAL HIGH (ref 22–32)
Calcium: 9.1 mg/dL (ref 8.9–10.3)
Chloride: 88 mmol/L — ABNORMAL LOW (ref 98–111)
Creatinine, Ser: 1.6 mg/dL — ABNORMAL HIGH (ref 0.44–1.00)
GFR calc Af Amer: 33 mL/min — ABNORMAL LOW (ref >60)
GFR calc non Af Amer: 29 mL/min — ABNORMAL LOW (ref >60)
Glucose, Bld: 227 mg/dL — ABNORMAL HIGH (ref 70–99)
Potassium: 5 mmol/L (ref 3.5–5.1)
Sodium: 136 mmol/L (ref 135–145)
Total Bilirubin: 0.6 mg/dL (ref 0.3–1.2)
Total Protein: 5.4 g/dL — ABNORMAL LOW (ref 6.5–8.1)

## 2019-12-20 LAB — GLUCOSE, CAPILLARY
Glucose-Capillary: 206 mg/dL — ABNORMAL HIGH (ref 70–99)
Glucose-Capillary: 229 mg/dL — ABNORMAL HIGH (ref 70–99)
Glucose-Capillary: 305 mg/dL — ABNORMAL HIGH (ref 70–99)
Glucose-Capillary: 424 mg/dL — ABNORMAL HIGH (ref 70–99)

## 2019-12-20 MED ORDER — INSULIN ASPART 100 UNIT/ML ~~LOC~~ SOLN
12.0000 [IU] | Freq: Once | SUBCUTANEOUS | Status: AC
  Administered 2019-12-20: 12 [IU] via SUBCUTANEOUS

## 2019-12-20 NOTE — Progress Notes (Addendum)
PROGRESS NOTE    [name]  [id number]  DOB: 03/23/1931  PCP: Hoyt Koch, MD  Admit date:12/10/2019 Hospital course: 86 y.o.femalewith medical history significant ofdCHF, CKD stage 3, DM2, HTN, COPD presented with few days history of slightly increased SOB and BLE edema associated with occasional cough and hypoxia with SPO2 in the 70s, without any fever or chills.Patient admitted on 2/5 with acute hypoxic respiratory failure secondary to CHF exacerbation and treated with diuresis/supportive care. On 2/11 the patient complained of severe bilateral feet/leg pain, felt secondary to peripheral neuropathy-ultrasound of lower extremities was negative for DVT-home med-gabapentin dose was increased initially but this was cut back on account of associated lethargy with increasing creatinine level.Also received antibiotic treatment with IV ceftriaxone x3d for E. coli UTI, repeat UA sent on 2/12 due to leukocytosis/low grade fever. CXR looks better, still on 3L. Plan was [facility], but anticipated by prior hospitalist to require SNF.   Subjective:  Care resumed by me today.  Patient noted to be supine and resting comfortably when entered in room.  While unit however noted to be on 0 L of O2.  Patient stated she just walked back from the bathroom and the nurse had turned off O2 temporarily but placed back on cannula with the intent to check sats on room air.  Patient denied any significant dyspnea but reported her main concern to be persistent pain in plantar aspect of bilateral feet, right greater than left-she described this as burning pain and more on bearing weight.  Patient noted to have bunion along first MTP joint of right foot--she denied any focal pain at this site and stated she has had this for a long time and had seen podiatry at one point.  When asked about disposition plan, she stated her son had spoken to MD yesterday and they have not come to a conclusion yet.  Objective: Vitals:    12/19/19 1737 12/19/19 2215 12/20/19 0500 12/20/19 0812  BP: (!) 126/40 (!) 131/47  106/77  Pulse: 68   61  Resp: 16 20  18   Temp: 98 F (36.7 C) 98.2 F (36.8 C)  (!) 97.5 F (36.4 C)  TempSrc: Oral Oral    SpO2: 96% 97%  99%  Weight: 91.8 kg  88.8 kg   Height:        Intake/Output Summary (Last 24 hours) at 12/20/2019 1044 Last data filed at 12/20/2019 0814 Gross per 24 hour  Intake 150 ml  Output 1600 ml  Net -1450 ml   Filed Weights   12/18/19 0356 12/19/19 1737 12/20/19 0500  Weight: 88 kg 91.8 kg 88.8 kg    Physical Examination:  General exam: Appears comfortable no acute distress. Respiratory system: Clear to auscultation. Respiratory effort normal. Cardiovascular system: S1 & S2 heard, RRR. No JVD, murmurs.No pedal edema. Gastrointestinal system: Abdomen is nondistended, soft and nontender. Normal bowel sounds heard. Central nervous system: Alert and oriented x3. No new focal neurological deficits. Extremities: Mild redness at the right first MTP joint/bunion but no point tenderness or swelling of any of the toes, MTP joints or ankles.  Skin: No rashes, lesions or ulcers Psychiatry: Judgement and insight appear normal. Mood & affect irritable.  Data Reviewed: I have personally reviewed following labs and imaging studies  CBC: Recent Labs  Lab 12/14/19 0317 12/14/19 0317 12/15/19 0227 12/17/19 0151 12/18/19 0723 12/19/19 0330 12/20/19 0748  WBC 8.3  --   --  14.8* 14.3* 13.2* 10.6*  NEUTROABS  --   --   --   --  13.6*  --   --   HGB 10.9*   < > 11.6* 11.6* 10.9* 10.5* 10.7*  HCT 37.0   < > 39.0 38.7 36.1 34.0* 34.7*  MCV 91.8  --   --  90.0 88.3 87.2 87.0  PLT 263  --   --  248 245 270 305   < > = values in this interval not displayed.   Basic Metabolic Panel: Recent Labs  Lab 12/16/19 0849 12/17/19 0151 12/18/19 0723 12/19/19 0330 12/20/19 0748  NA 142 138 134* 130* 136  K 4.6 4.6 4.9 4.8 5.0  CL 95* 90* 88* 84* 88*  CO2 35* 36* 33* 33* 35*    GLUCOSE 101* 150* 211* 296* 227*  BUN 30* 33* 53* 71* 81*  CREATININE 1.22* 1.35* 1.61* 1.60* 1.60*  CALCIUM 9.0 8.7* 8.9 8.8* 9.1   GFR: Estimated Creatinine Clearance: 27.2 mL/min (A) (by C-G formula based on SCr of 1.6 mg/dL (H)). Liver Function Tests: Recent Labs  Lab 12/18/19 0723 12/19/19 0330 12/20/19 0748  AST 25 68* 111*  ALT 24 58* 139*  ALKPHOS 49 48 48  BILITOT 1.0 0.7 0.6  PROT 5.5* 5.4* 5.4*  ALBUMIN 2.3* 2.3* 2.3*   No results for input(s): LIPASE, AMYLASE in the last 168 hours. Recent Labs  Lab 12/17/19 1215  AMMONIA 20   Coagulation Profile: No results for input(s): INR, PROTIME in the last 168 hours. Cardiac Enzymes: No results for input(s): CKTOTAL, CKMB, CKMBINDEX, TROPONINI in the last 168 hours. BNP (last 3 results) No results for input(s): PROBNP in the last 8760 hours. HbA1C: No results for input(s): HGBA1C in the last 72 hours. CBG: Recent Labs  Lab 12/19/19 0802 12/19/19 1150 12/19/19 1708 12/19/19 2107 12/20/19 0740  GLUCAP 320* 275* 314* 418* 206*   Lipid Profile: No results for input(s): CHOL, HDL, LDLCALC, TRIG, CHOLHDL, LDLDIRECT in the last 72 hours. Thyroid Function Tests: Recent Labs    12/17/19 1215  TSH 0.347*  FREET4 0.98   Anemia Panel: Recent Labs    12/17/19 1215  VITAMINB12 471   Sepsis Labs: No results for input(s): PROCALCITON, LATICACIDVEN in the last 168 hours.  Recent Results (from the past 240 hour(s))  Respiratory Panel by RT PCR (Flu A&B, Covid) - Nasopharyngeal Swab     Status: None   Collection Time: 12/10/19 11:00 PM   Specimen: Nasopharyngeal Swab  Result Value Ref Range Status   SARS Coronavirus 2 by RT PCR NEGATIVE NEGATIVE Final    Comment: (NOTE) SARS-CoV-2 target nucleic acids are NOT DETECTED. The SARS-CoV-2 RNA is generally detectable in upper respiratoy specimens during the acute phase of infection. The lowest concentration of SARS-CoV-2 viral copies this assay can detect is 131  copies/mL. A negative result does not preclude SARS-Cov-2 infection and should not be used as the sole basis for treatment or other patient management decisions. A negative result may occur with  improper specimen collection/handling, submission of specimen other than nasopharyngeal swab, presence of viral mutation(s) within the areas targeted by this assay, and inadequate number of viral copies (<131 copies/mL). A negative result must be combined with clinical observations, patient history, and epidemiological information. The expected result is Negative. Fact Sheet for Patients:  PinkCheek.be Fact Sheet for Healthcare Providers:  GravelBags.it This test is not yet ap proved or cleared by the [place] FDA and  has been authorized for detection and/or diagnosis of SARS-CoV-2 by FDA under an Emergency Use Authorization (EUA). This EUA will remain  in effect (  meaning this test can be used) for the duration of the COVID-19 declaration under Section 564(b)(1) of the Act, 21 U.S.C. section 360bbb-3(b)(1), unless the authorization is terminated or revoked sooner.    Influenza A by PCR NEGATIVE NEGATIVE Final   Influenza B by PCR NEGATIVE NEGATIVE Final    Comment: (NOTE) The Xpert Xpress SARS-CoV-2/FLU/RSV assay is intended as an aid in  the diagnosis of influenza from Nasopharyngeal swab specimens and  should not be used as a sole basis for treatment. Nasal washings and  aspirates are unacceptable for Xpert Xpress SARS-CoV-2/FLU/RSV  testing. Fact Sheet for Patients: PinkCheek.be Fact Sheet for Healthcare Providers: GravelBags.it This test is not yet approved or cleared by the [place] FDA and  has been authorized for detection and/or diagnosis of SARS-CoV-2 by  FDA under an Emergency Use Authorization (EUA). This EUA will remain  in effect (meaning this test can  be used) for the duration of the  Covid-19 declaration under Section 564(b)(1) of the Act, 21  U.S.C. section 360bbb-3(b)(1), unless the authorization is  terminated or revoked. Performed at [facility], [place] [address], [place], [place] [address]   Culture, Urine     Status: Abnormal   Collection Time: 12/11/19  1:38 AM   Specimen: Urine, Random  Result Value Ref Range Status   Specimen Description URINE, RANDOM  Final   Special Requests   Final    NONE Performed at [facility], [place] [address] [place], [place] [address]    Culture >=100,000 COLONIES/mL ESCHERICHIA COLI (A)  Final   Report Status 12/13/2019 FINAL  Final   Organism ID, Bacteria ESCHERICHIA COLI (A)  Final      Susceptibility   Escherichia coli - MIC*    AMPICILLIN <=2 SENSITIVE Sensitive     CEFAZOLIN <=4 SENSITIVE Sensitive     CEFTRIAXONE <=0.25 SENSITIVE Sensitive     CIPROFLOXACIN <=0.25 SENSITIVE Sensitive     GENTAMICIN <=1 SENSITIVE Sensitive     IMIPENEM <=0.25 SENSITIVE Sensitive     NITROFURANTOIN <=16 SENSITIVE Sensitive     TRIMETH/SULFA <=20 SENSITIVE Sensitive     AMPICILLIN/SULBACTAM <=2 SENSITIVE Sensitive     PIP/TAZO <=4 SENSITIVE Sensitive     * >=100,000 COLONIES/mL ESCHERICHIA COLI  Culture, Urine     Status: None   Collection Time: 12/17/19  7:00 PM   Specimen: Urine, Random  Result Value Ref Range Status   Specimen Description URINE, RANDOM  Final   Special Requests NONE  Final   Culture   Final    NO GROWTH Performed at [facility], [place] [address], [place],  [address]    Report Status 12/19/2019 FINAL  Final      Radiology Studies: No results found.      Scheduled Meds: . allopurinol  50 mg Oral Daily  . amLODipine  5 mg Oral Daily  . aspirin EC  81 mg Oral q morning - 10a  . atorvastatin  40 mg Oral q1800  . enoxaparin (LOVENOX) injection  30 mg Subcutaneous QHS  . feeding supplement (GLUCERNA SHAKE)  237 mL Oral BID BM  . fluticasone  2  spray Each Nare Daily  . furosemide  80 mg Oral Daily  . hydrALAZINE  25 mg Oral BID  . insulin aspart  0-9 Units Subcutaneous TID WC  . insulin aspart  5 Units Subcutaneous TID WC  . insulin glargine  30 Units Subcutaneous QHS  . methylPREDNISolone (SOLU-MEDROL) injection  40  mg Intravenous Daily  . metoprolol tartrate  25 mg Oral BID  . multivitamin with minerals  1 tablet Oral Daily  . polyethylene glycol  17 g Oral BID  . sodium chloride flush  3 mL Intravenous Q12H   Continuous Infusions: . sodium chloride      Assessment & Plan:   1.  Acute hypoxic respiratory failure secondary to acute on chronic diastolic CHF: Echocardiogram showed EF 60 to [id number], grade 1 diastolic dysfunction.  Patient received IV diuresis in the initial hospital course and then transition to 80 mg oral Lasix daily (home dose) and concern for rising BUN/bicarb levels.  She appears euvolemic.  Will obtain O2 desaturation studies today.  May need home O2 arranged.  2.  E. coli UTI: Urine cultures grew E. coli initially and treated with IV ceftriaxone.  Repeat UA on 2/12 unremarkable.  Currently off antibiotics.  3.  AKI on CKD stage IIIb with metabolic alkalosis: Patient's baseline creatinine appears to be around 1.4-1.6 with GFR ranging between 30 to 45 in last 10 years.  Creatinine appears to be around baseline in the setting of ongoing oral diuretics however BUN appears to be uptrending and now at 80.  Could be related to overdiuresis and prerenal component versus IV steroid related uremia.  Would reduce Lasix dosage in concern for overdiuresis and taper steroids as appropriate (see below regarding patient's reluctance for changing medication regimen)  4.  Bilateral feet pain: Per prior hospitalist notes patient has been complaining of bilateral feet/leg pain which was felt secondary to neuropathy, gabapentin dose was increased but patient did not tolerate well with increased confusion and lethargy hence this  medication was held.  Patient states she has been on Neurontin 600 mg for many years without problems.  Patient also reported right knee pain on 2/12, received Solu-Medrol 125 mg IV x1 by Dr. Bonner Puna for possible gouty flare in the setting of IV diuresis and subsequently evaluated by orthopedics Dr. Stann Mainland on 2/13--Per Ortho note, no significant swelling for tap.  No warmth or erythema.  They had planned for follow-up and recommended weightbearing as tolerated but I do not see any follow-up notes from the weekend.  She remains on IV Solu-Medrol 40 mg daily.  Today patient does not complain of any knee pain and only reported pain on the plantar aspect of her feet which would be atypical for gout--I proposed resuming Neurontin at her home dose or adding colchicine but patient became extremely defensive and did not want me to change any of the medications that were started by Dr Bonner Puna.  As such, I assured her that current medication regimen (steroids and allopurinol) will be continued while PT works with her.  5. Acute metabolic encephalopathy: Now improved and oriented x3.  Could have been related to problem #1, #2 or #3/uremia or medication induced from increased Neurontin dosage/tramadol-both these medications on hold currently.  Continues to have significant uremia, hypoxia resolving and UTI treated..  6.  Diabetes mellitus, uncontrolled with hyperglycemia: In the setting of IV steroid use. Her blood glucose was elevated in the 400s yesterday.  Today in the 200s.  Monitor closely on current insulin regimen.  Hemoglobin A1c 6.7%.  7.  Obesity with BMI 33: Follow-up PCP to discuss lifestyle modifications.   DVT prophylaxis: Lovenox Code Status: DNR Disposition Plan: Likely home with home health per patient preference-final decision pending. Family / Patient Communication: Discussed with patient in detail.  Patient initially was pleasant and interactive during my visit this  afternoon.  She however became  visibly upset when offered to change medication regimen to help with pain in her feet.  I explained to her that I only offered to consider other medications given persistent complaint of feet pain in spite of current medication regimen.  She however stated she would like to stick with the recommendations of Dr.Grunz and would like to have him as her primary MD.  She stated she is frustrated with changing doctors over the weekend and with me seeing her today.  I did apologize to her regarding inconvenience in terms of variable MD schedules but also assured her that we communicate through documentation regarding care plans.  She appeared to be satisfied when I again assured her that I would not change her treatment regimen if she feels that is helping her but will follow up with her primary nurse regarding o2 desat studies.  About an hour later, I was contacted by Ms. Glenna Durand, Editor, commissioning for the unit that patient and her son now want a different MD.  Per hospital policy, director on call, Dr. Tana Coast was updated by me and I requested alternate MD to evaluate patient in a.m.  Patient and family satisfied with this arrangement per my follow-up discussion with Ms. Waneta Martins.  Although ideally I would have liked to reduce steroid dosages in concern for uremia and hyperglycemia as well as diuretic dosage in concern for contraction alkalosis--I have deferred these changes for MD in a.m. as they deem appropriate given non urgency and with intent to respect patient's wishes. I will sign off.       LOS: 9 days    Time spent: 45 minutes    Guilford Shi, MD [facility] Pager in [place]  If 7PM-7AM, please contact night-coverage [email or web address] Password [facility] 12/20/2019, 10:44 AM

## 2019-12-20 NOTE — Progress Notes (Signed)
Inpatient Diabetes Program Recommendations  AACE/ADA: New Consensus Statement on Inpatient Glycemic Control (2015)  Target Ranges:  Prepandial:   less than 140 mg/dL      Peak postprandial:   less than 180 mg/dL (1-2 hours)      Critically ill patients:  140 - 180 mg/dL   Results for SHERLEE, DETLEFSEN (MRN [id number]) as of 12/20/2019 14:26  Ref. Range 12/19/2019 08:02 12/19/2019 11:50 12/19/2019 17:08 12/19/2019 21:07  Glucose-Capillary Latest Ref Range: 70 - 99 mg/dL 320 (H)  12 units NOVOLOG 275 (H)  10 units NOVOLOG  314 (H)  12 units NOVOLOG  418 (H)    30 units LANTUS   Results for SHERETHA, BAGDASARIAN (MRN [id number]) as of 12/20/2019 14:26  Ref. Range 12/20/2019 07:40 12/20/2019 14:02  Glucose-Capillary Latest Ref Range: 70 - 99 mg/dL 206 (H)  8 units NOVOLOG  229 (H)  8 units NOVOLOG      Admit SOB/ CHF Flare/ UTI  History: DM, CHF, CKD 3, COPD   Home DM Meds: Lantus 20 units QHS       Novolog 3-8 units TID per SSI        Glipizide 5 mg Daily  Current Orders: Lantus 30 units QHS      Novolog Sensitive Correction Scale/ SSI (0-9 units) TID AC      Novolog 5 units TID with meals     Getting Solumedrol 40 mg daily.   Lantus increased last PM.   CBG down to 206 this AM but back up to 229 at 2pm.   MD- Please consider the following:  1. Increase Lantus slightly to 32 units QHS  2. Increase Novolog Meal Coverage to: Novolog 8 units TID with meals     --Will follow patient during hospitalization--  Wyn Quaker RN, MSN, CDE Diabetes Coordinator Inpatient Glycemic Control Team Team Pager: [phone] (8a-5p)

## 2019-12-20 NOTE — Progress Notes (Signed)
RN made aware by patient's son that the patient expresses some frustration about the care plan and provider on call today. [name] who was at bedside (Son )also shared these frustrations. Retail banker is aware of the situation and came to speak to son and patient.

## 2019-12-20 NOTE — Progress Notes (Signed)
Physical Therapy Treatment Patient Details Name: AZENET DONATI MRN: [id number] DOB: [birth year] Today's Date: 12/20/2019    History of Present Illness Pt is an 86 y.o. female admitted 12/10/19 with worsenign SOB and BLE edema, also hypoxic. Worked up for acute hypoxic respiratory failure secondary to decompensated HF. 2/11 pt with onset of bilateral foot/lower leg pain limiting ability to mobilize. PMH includes dCHF, CKD 3, COPD, HTN.    PT Comments    Pt moving much better today. Continues to report 7/10 pain R knee with weightbearing, but no pain with palpation or ROM. She required min assist bed mobility, min assist transfers and min guard assist ambulation 7' with RW. Pt in recliner with feet elevated at end of session. Discharge recommendation updated to initial plan of home with son.    Follow Up Recommendations  No PT follow up;Supervision for mobility/OOB(Pt/family declining HH.)     Equipment Recommendations  None recommended by PT    Recommendations for Other Services       Precautions / Restrictions Precautions Precautions: Fall Precaution Comments: hx of falls Restrictions Weight Bearing Restrictions: No    Mobility  Bed Mobility Overal bed mobility: Needs Assistance Bed Mobility: Supine to Sit Rolling: Min guard   Supine to sit: Min assist     General bed mobility comments: +rail, cues for sequencing, assist to elevate trunk  Transfers Overall transfer level: Needs assistance Equipment used: Rolling walker (2 wheeled) Transfers: Sit to/from [facility] Sit to Stand: Min assist Stand pivot transfers: Min assist       General transfer comment: cues for sequencing, assist to power up  Ambulation/Gait Ambulation/Gait assistance: Min guard Gait Distance (Feet): 7 Feet Assistive device: Rolling walker (2 wheeled) Gait Pattern/deviations: Step-through pattern;Decreased stride length;Trunk flexed Gait velocity: decreased   General Gait  Details: Pt on 4L O2 with SpO2 96%. Distance limited by R knee pain.   Stairs             Wheelchair Mobility    Modified Rankin (Stroke Patients Only)       Balance Overall balance assessment: Needs assistance Sitting-balance support: Feet supported;No upper extremity supported Sitting balance-Leahy Scale: Good     Standing balance support: Bilateral upper extremity supported;During functional activity Standing balance-Leahy Scale: Poor Standing balance comment: reliant on RW                            Cognition Arousal/Alertness: Awake/alert Behavior During Therapy: Impulsive;WFL for tasks assessed/performed Overall Cognitive Status: Within Functional Limits for tasks assessed                                 General Comments: Agreeable and pleasant, however required increased cues for safety      Exercises General Exercises - Lower Extremity Ankle Circles/Pumps: AROM;Both;10 reps;Supine    General Comments General comments (skin integrity, edema, etc.): 4L continuous O2. VSS      Pertinent Vitals/Pain Pain Assessment: 0-10 Pain Score: 7  Pain Location: R knee Pain Descriptors / Indicators: Grimacing;Guarding Pain Intervention(s): Monitored during session;Repositioned;Limited activity within patient's tolerance    Home Living                      Prior Function            PT Goals (current goals can now be found in the care plan section) Acute  Rehab PT Goals Patient Stated Goal: home Progress towards PT goals: Progressing toward goals    Frequency    Min 3X/week      PT Plan Discharge plan needs to be updated    Co-evaluation PT/OT/SLP Co-Evaluation/Treatment: Yes Reason for Co-Treatment: For patient/therapist safety;To address functional/ADL transfers PT goals addressed during session: Mobility/safety with mobility;Balance OT goals addressed during session: ADL's and self-care;Proper use of Adaptive  equipment and DME      AM-PAC PT "6 Clicks" Mobility   Outcome Measure  Help needed turning from your back to your side while in a flat bed without using bedrails?: A Little Help needed moving from lying on your back to sitting on the side of a flat bed without using bedrails?: A Little Help needed moving to and from a bed to a chair (including a wheelchair)?: A Little Help needed standing up from a chair using your arms (e.g., wheelchair or bedside chair)?: A Little Help needed to walk in hospital room?: A Little Help needed climbing 3-5 steps with a railing? : A Lot 6 Click Score: 17    End of Session Equipment Utilized During Treatment: Gait belt;Oxygen Activity Tolerance: Patient limited by pain Patient left: in chair;with call bell/phone within reach Nurse Communication: Mobility status PT Visit Diagnosis: Unsteadiness on feet (R26.81);Other abnormalities of gait and mobility (R26.89);Muscle weakness (generalized) (M62.81);History of falling (Z91.81);Difficulty in walking, not elsewhere classified (R26.2)     Time: [id number] PT Time Calculation (min) (ACUTE ONLY): 26 min  Charges:  $Gait Training: 8-22 mins                     Lorrin Goodell, PT  Office # [phone] Pager [phone]    Lorriane Shire 12/20/2019, 11:33 AM

## 2019-12-20 NOTE — Progress Notes (Signed)
Occupational Therapy Treatment Patient Details Name: [name] MRN: [id number] DOB: 1930/12/24 Today's Date: 12/20/2019    History of present illness Pt is an 86 y.o. female admitted 12/10/19 with worsenign SOB and BLE edema, also hypoxic. Worked up for acute hypoxic respiratory failure secondary to decompensated HF. 2/11 pt with onset of bilateral foot/lower leg pain limiting ability to mobilize. PMH includes dCHF, CKD 3, COPD, HTN.   OT comments  Patient continues to make steady progress towards goals in skilled OT session. Patient's session encompassed cotreat with PT for overall safety and mobility. Pt with significant increase in ability, completing transfer to [facility], grooming tasks, and walking minimally with RW (<10 ft). Pt able to transfer to [facility] with min A, requiring min A to hygiene. Pt completed brushing of teeth and grooming seated (secondary to fatigue). Pt able to reach socks in sitting, with min difficulty secondary to pain (states she has adaptive equipment at home she uses regularly). Pt with increased fatigue attempting to walk household distances, however remains motivated to stay active to regain prior level of function. Will follow acutely.    Follow Up Recommendations  No OT follow up;Supervision - Intermittent    Equipment Recommendations  None recommended by OT    Recommendations for Other Services      Precautions / Restrictions Precautions Precautions: Fall Precaution Comments: hx of falls Restrictions Weight Bearing Restrictions: No       Mobility Bed Mobility Overal bed mobility: Needs Assistance Bed Mobility: Supine to Sit;Sit to Supine Rolling: Min guard   Supine to sit: Min assist     General bed mobility comments: Pt with increased mobility in session requirin min A of 1 to come to sitting, increased cues to use bed rails  Transfers Overall transfer level: Needs assistance Equipment used: Rolling walker (2 wheeled) Transfers: Sit to/from  Stand Sit to Stand: Min assist         General transfer comment: Able to tansfer to [facility] and walk approximately ten feet before requesting to sit down due to pain    Balance Overall balance assessment: Needs assistance Sitting-balance support: Feet supported;No upper extremity supported Sitting balance-Leahy Scale: Good     Standing balance support: Bilateral upper extremity supported;During functional activity Standing balance-Leahy Scale: Poor                             ADL either performed or assessed with clinical judgement   ADL Overall ADL's : Needs assistance/impaired     Grooming: Min guard;Standing;Oral care   Upper Body Bathing: Set up;Sitting           Lower Body Dressing: Min guard;Sitting/lateral leans Lower Body Dressing Details (indicate cue type and reason): Fixing socks, sitting in chair Toilet Transfer: Ambulation;RW;BSC;+2 for physical assistance;Moderate assistance;+2 for safety/equipment;Minimal assistance Toilet Transfer Details (indicate cue type and reason): more impulsive in session, however required min/mod assist to transfer to [place] and Hygiene: Minimal assistance;Sit to/from stand;Cueing for safety Toileting - Clothing Manipulation Details (indicate cue type and reason): Able to wipe with toilet paper, however assisted with washcloth for thoroughness     Functional mobility during ADLs: Minimal assistance General ADL Comments: pt requiring min cueing for safety and RW mgmt, with O2 line mgmt      Vision Baseline Vision/History: Wears glasses     Perception     Praxis      Cognition Arousal/Alertness: Awake/alert Behavior During Therapy: Impulsive(Required increased  cues for safety/awareness)                                   General Comments: Agreeable and pleasant, however required increased cues for safety        Exercises     Shoulder Instructions       General  Comments      Pertinent Vitals/ Pain       Pain Assessment: 0-10 Pain Score: 7  Pain Descriptors / Indicators: Grimacing;Guarding Pain Intervention(s): Limited activity within patient's tolerance;Monitored during session;Repositioned  Home Living                                          Prior Functioning/Environment              Frequency  Min 2X/week        Progress Toward Goals  OT Goals(current goals can now be found in the care plan section)  Progress towards OT goals: Progressing toward goals  Acute Rehab OT Goals Patient Stated Goal: to get back home soon  OT Goal Formulation: With patient Time For Goal Achievement: 12/25/19 Potential to Achieve Goals: Good  Plan Discharge plan remains appropriate;Frequency remains appropriate    Co-evaluation    PT/OT/SLP Co-Evaluation/Treatment: Yes Reason for Co-Treatment: For patient/therapist safety;To address functional/ADL transfers   OT goals addressed during session: ADL's and self-care;Proper use of Adaptive equipment and DME      AM-PAC OT "6 Clicks" Daily Activity     Outcome Measure   Help from another person eating meals?: None Help from another person taking care of personal grooming?: A Little Help from another person toileting, which includes using toliet, bedpan, or urinal?: A Little Help from another person bathing (including washing, rinsing, drying)?: A Little Help from another person to put on and taking off regular upper body clothing?: A Little Help from another person to put on and taking off regular lower body clothing?: A Little 6 Click Score: 19    End of Session Equipment Utilized During Treatment: Oxygen;Gait belt;Rolling walker  OT Visit Diagnosis: Unsteadiness on feet (R26.81)   Activity Tolerance Patient tolerated treatment well   Patient Left in chair;with call bell/phone within reach   Nurse Communication          Time: [id number] OT Time Calculation  (min): 26 min  Charges: OT General Charges $OT Visit: 1 Visit OT Treatments $Self Care/Home Management : 8-22 mins  Corinne Ports E. Marlyce Mcdougald, COTA/L Acute Rehabilitation Services [place] 12/20/2019, 10:40 AM

## 2019-12-21 LAB — CBC
HCT: 35.6 % — ABNORMAL LOW (ref 36.0–46.0)
Hemoglobin: 10.8 g/dL — ABNORMAL LOW (ref 12.0–15.0)
MCH: 26.6 pg (ref 26.0–34.0)
MCHC: 30.3 g/dL (ref 30.0–36.0)
MCV: 87.7 fL (ref 80.0–100.0)
Platelets: 305 10*3/uL (ref 150–400)
RBC: 4.06 MIL/uL (ref 3.87–5.11)
RDW: 14.3 % (ref 11.5–15.5)
WBC: 8.4 10*3/uL (ref 4.0–10.5)
nRBC: 0 % (ref 0.0–0.2)

## 2019-12-21 LAB — BASIC METABOLIC PANEL
Anion gap: 9 (ref 5–15)
BUN: 85 mg/dL — ABNORMAL HIGH (ref 8–23)
CO2: 35 mmol/L — ABNORMAL HIGH (ref 22–32)
Calcium: 8.9 mg/dL (ref 8.9–10.3)
Chloride: 88 mmol/L — ABNORMAL LOW (ref 98–111)
Creatinine, Ser: 1.68 mg/dL — ABNORMAL HIGH (ref 0.44–1.00)
GFR calc Af Amer: 32 mL/min — ABNORMAL LOW (ref >60)
GFR calc non Af Amer: 27 mL/min — ABNORMAL LOW (ref >60)
Glucose, Bld: 324 mg/dL — ABNORMAL HIGH (ref 70–99)
Potassium: 5.1 mmol/L (ref 3.5–5.1)
Sodium: 132 mmol/L — ABNORMAL LOW (ref 135–145)

## 2019-12-21 LAB — GLUCOSE, CAPILLARY
Glucose-Capillary: 228 mg/dL — ABNORMAL HIGH (ref 70–99)
Glucose-Capillary: 230 mg/dL — ABNORMAL HIGH (ref 70–99)
Glucose-Capillary: 196 mg/dL — ABNORMAL HIGH (ref 70–99)
Glucose-Capillary: 347 mg/dL — ABNORMAL HIGH (ref 70–99)
Glucose-Capillary: 369 mg/dL — ABNORMAL HIGH (ref 70–99)

## 2019-12-21 MED ORDER — PREDNISONE 20 MG PO TABS
40.0000 mg | ORAL_TABLET | Freq: Every day | ORAL | Status: DC
  Administered 2019-12-22: 40 mg via ORAL
  Filled 2019-12-21: qty 2

## 2019-12-21 NOTE — Care Management Important Message (Signed)
Important Message  Patient Details  Name: [name] MRN: [id number] Date of Birth: [birth year]   Medicare Important Message Given:  Yes     Coreena Rubalcava [name] 12/21/2019, 1:49 PM

## 2019-12-21 NOTE — Progress Notes (Signed)
Inpatient Diabetes Program Recommendations  AACE/ADA: New Consensus Statement on Inpatient Glycemic Control (2015)  Target Ranges:  Prepandial:   less than 140 mg/dL      Peak postprandial:   less than 180 mg/dL (1-2 hours)      Critically ill patients:  140 - 180 mg/dL   Lab Results  Component Value Date   GLUCAP 230 (H) 12/21/2019   HGBA1C 6.7 (H) 12/11/2019    Review of Glycemic Control Results for [name], [name] (MRN [id number]) as of 12/21/2019 10:49  Ref. Range 12/20/2019 16:40 12/20/2019 21:23 12/21/2019 06:48 12/21/2019 07:34  Glucose-Capillary Latest Ref Range: 70 - 99 mg/dL 305 (H) 424 (H) 228 (H) 230 (H)   Admit SOB/ CHF Flare/ UTI  History: DM, CHF, CKD 3, COPD   Home DM Meds: Lantus 20 units QHS                             Novolog 3-8 units TID per SSI                              Glipizide 5 mg Daily  Current Orders: Lantus 30 units QHS                            Novolog Sensitive Correction Scale/ SSI (0-9 units) TID AC                            Novolog 5 units TID with meals      Novolog 12 units x 1   Steroids tapered to Prednisone 40 mg QAM  CBG down to 206 this AM but back up to 229 at 2pm.   Consider: - Increase Novolog Meal Coverage to: Novolog 8 units TID with meals (assuming patient is consuming >50% of meals).   Thanks, Bronson Curb, MSN, RNC-OB Diabetes Coordinator [phone] (8a-5p)

## 2019-12-21 NOTE — Progress Notes (Signed)
Foley cath discontinued per MD orders.  Pure wick placed per patient request.  Patient tolerated well.

## 2019-12-21 NOTE — Progress Notes (Signed)
PROGRESS NOTE    [name]  [id number] DOB: 1931/01/07 DOA: 12/10/2019 PCP: Hoyt Koch, MD   Brief Narrative:  Hospital course: 86 y.o.femalewith medical history significant ofdCHF, CKD stage 3, DM2, HTN, COPD presented with few days history of slightly increased SOB and BLE edema associated with occasional cough and hypoxia with SPO2 in the 70s, without any fever or chills.Patient admitted on 2/5 with acute hypoxic respiratory failure secondary to CHF exacerbation and treated with diuresis/supportive care. On 2/11 the patient complained of severe bilateral feet/leg pain, felt secondary to peripheral neuropathy-ultrasound of lower extremities was negative for DVT-home med-gabapentin dose was increased initially but this was cut back on account of associated lethargy with increasing creatinine level.Also received antibiotic treatment with IV ceftriaxone x3d for E. coli UTI, repeat UA sent on 2/12 due to leukocytosis/low grade fever. CXR looks better.   Assessment & Plan:   Principal Problem:   Acute respiratory failure with hypoxia (HCC) Active Problems:   Acute on chronic diastolic CHF (congestive heart failure) (HCC)   Diabetic polyneuropathy associated with type 2 diabetes mellitus (HCC)   Benign hypertension with CKD (chronic kidney disease) stage III   Acute lower UTI  Acute respiratory failure with hypoxia: -resolved at this time and on oral lasix -may need oxygen at D/C for night time -euvolemic on exam today  E coli UTI: -resolved after 3 day course antibiotics -no symptoms currently  Foot pain: -patient has been told this may be gout or psuedogout -has been on IV steroids for 4 days now -changed to oral prednisone today -if no worsening tomorrow can D/C home 12/22/19 -takes gabapentin chronically -PT/OT with assessment HH would be appropriate  CKD stage 3: -stable Cr last few days -on home regimen, BP stable  DM type 2 with hyperglycemia: -in  setting of IV steroids, this morning 230 which is improved from 400s -continue insulin regimen -HgA1c most recent 6.7%  DVT prophylaxis: Lovenox Code Status: full Family Communication: patient only Disposition Plan:  . Patient came from:home            . Anticipated d/c place:home with pt . Barriers to d/c OR conditions which need to be met to effect a safe d/c: switched steroids to oral today, if feet are not worsening can D/C with [facility] tomorrow   Consultants:   none  Antimicrobials:   None currently   Subjective: Feeling better, sitting up in chair today. Feet are improving on the medicines. She is not having any SOB or fluid build up that she can tell. Moved around some with PT yesterday. Did not get on with the doctor yesterday well and no changes were made then. Denies fevers or chills. Denies chest pains. Denies abdominal pain, nausea, diarrhea, constipation.   Objective: Vitals:   12/20/19 1656 12/20/19 2344 12/21/19 0300 12/21/19 0821  BP: (!) 127/45 (!) 133/51  (!) 134/56  Pulse:  (!) 58  63  Resp:      Temp: 97.8 F (36.6 C) 98.1 F (36.7 C)  98.5 F (36.9 C)  TempSrc: Oral     SpO2: 98% 98%  100%  Weight:   87.6 kg   Height:        Intake/Output Summary (Last 24 hours) at 12/21/2019 1018 Last data filed at 12/21/2019 0323 Gross per 24 hour  Intake --  Output 2400 ml  Net -2400 ml   Filed Weights   12/19/19 1737 12/20/19 0500 12/21/19 0300  Weight: 91.8 kg 88.8 kg 87.6 kg  Examination:  General exam: Appears calm and comfortable, sitting up in chair Respiratory system: Clear to auscultation. Respiratory effort normal. Cardiovascular system: S1 & S2 heard, RRR. Gastrointestinal system: Abdomen is nondistended, soft and nontender. No organomegaly or masses felt. Normal bowel sounds heard. Central nervous system: Alert and oriented. No focal neurological deficits. Extremities: Symmetric 5 x 5 power. Some pain in feet Skin: No rashes, lesions or  ulcers Psychiatry: Judgement and insight appear normal. Mood & affect appropriate.   Data Reviewed: I have personally reviewed following labs and imaging studies  CBC: Recent Labs  Lab 12/17/19 0151 12/18/19 0723 12/19/19 0330 12/20/19 0748 12/21/19 0157  WBC 14.8* 14.3* 13.2* 10.6* 8.4  NEUTROABS  --  13.6*  --   --   --   HGB 11.6* 10.9* 10.5* 10.7* 10.8*  HCT 38.7 36.1 34.0* 34.7* 35.6*  MCV 90.0 88.3 87.2 87.0 87.7  PLT 248 245 270 305 025   Basic Metabolic Panel: Recent Labs  Lab 12/17/19 0151 12/18/19 0723 12/19/19 0330 12/20/19 0748 12/21/19 0157  NA 138 134* 130* 136 132*  K 4.6 4.9 4.8 5.0 5.1  CL 90* 88* 84* 88* 88*  CO2 36* 33* 33* 35* 35*  GLUCOSE 150* 211* 296* 227* 324*  BUN 33* 53* 71* 81* 85*  CREATININE 1.35* 1.61* 1.60* 1.60* 1.68*  CALCIUM 8.7* 8.9 8.8* 9.1 8.9   GFR: Estimated Creatinine Clearance: 25.8 mL/min (A) (by C-G formula based on SCr of 1.68 mg/dL (H)). Liver Function Tests: Recent Labs  Lab 12/18/19 0723 12/19/19 0330 12/20/19 0748  AST 25 68* 111*  ALT 24 58* 139*  ALKPHOS 49 48 48  BILITOT 1.0 0.7 0.6  PROT 5.5* 5.4* 5.4*  ALBUMIN 2.3* 2.3* 2.3*   No results for input(s): LIPASE, AMYLASE in the last 168 hours. Recent Labs  Lab 12/17/19 1215  AMMONIA 20   Coagulation Profile: No results for input(s): INR, PROTIME in the last 168 hours. Cardiac Enzymes: No results for input(s): CKTOTAL, CKMB, CKMBINDEX, TROPONINI in the last 168 hours. BNP (last 3 results) No results for input(s): PROBNP in the last 8760 hours. HbA1C: No results for input(s): HGBA1C in the last 72 hours. CBG: Recent Labs  Lab 12/20/19 1402 12/20/19 1640 12/20/19 2123 12/21/19 0648 12/21/19 0734  GLUCAP 229* 305* 424* 228* 230*   Lipid Profile: No results for input(s): CHOL, HDL, LDLCALC, TRIG, CHOLHDL, LDLDIRECT in the last 72 hours. Thyroid Function Tests: No results for input(s): TSH, T4TOTAL, FREET4, T3FREE, THYROIDAB in the last 72  hours. Anemia Panel: No results for input(s): VITAMINB12, FOLATE, FERRITIN, TIBC, IRON, RETICCTPCT in the last 72 hours. Sepsis Labs: No results for input(s): PROCALCITON, LATICACIDVEN in the last 168 hours.  Recent Results (from the past 240 hour(s))  Culture, Urine     Status: None   Collection Time: 12/17/19  7:00 PM   Specimen: Urine, Random  Result Value Ref Range Status   Specimen Description URINE, RANDOM  Final   Special Requests NONE  Final   Culture   Final    NO GROWTH Performed at [facility], [address], [place], [place] [address]    Report Status 12/19/2019 FINAL  Final    Radiology Studies: No results found.  Scheduled Meds: . allopurinol  50 mg Oral Daily  . amLODipine  5 mg Oral Daily  . aspirin EC  81 mg Oral q morning - 10a  . atorvastatin  40 mg Oral q1800  . enoxaparin (LOVENOX) injection  30  mg Subcutaneous QHS  . feeding supplement (GLUCERNA SHAKE)  237 mL Oral BID BM  . fluticasone  2 spray Each Nare Daily  . furosemide  80 mg Oral Daily  . hydrALAZINE  25 mg Oral BID  . insulin aspart  0-9 Units Subcutaneous TID WC  . insulin aspart  5 Units Subcutaneous TID WC  . insulin glargine  30 Units Subcutaneous QHS  . metoprolol tartrate  25 mg Oral BID  . multivitamin with minerals  1 tablet Oral Daily  . polyethylene glycol  17 g Oral BID  . predniSONE  40 mg Oral Q breakfast  . sodium chloride flush  3 mL Intravenous Q12H   Continuous Infusions: . sodium chloride      LOS: 10 days   Time spent: 25  Hoyt Koch, MD [facility]  To contact the attending provider between 7A-7P or the covering provider during after hours 7P-7A, please log into the web site [email or web address] and access using universal [place] password for that web site. If you do not have the password, please call the hospital operator.  12/21/2019, 10:18 AM

## 2019-12-21 NOTE — Plan of Care (Signed)
  Problem: Education: Goal: Knowledge of General Education information will improve Description: Including pain rating scale, medication(s)/side effects and non-pharmacologic comfort measures Outcome: Progressing   Problem: Clinical Measurements: Goal: Cardiovascular complication will be avoided Outcome: Progressing   Problem: Activity: Goal: Risk for activity intolerance will decrease Outcome: Progressing   Problem: Elimination: Goal: Will not experience complications related to bowel motility Outcome: Progressing   Problem: Pain Managment: Goal: General experience of comfort will improve Outcome: Progressing   Problem: Safety: Goal: Ability to remain free from injury will improve Outcome: Progressing   Problem: Skin Integrity: Goal: Risk for impaired skin integrity will decrease Outcome: Progressing   Problem: Education: Goal: Ability to demonstrate management of disease process will improve Outcome: Progressing Goal: Ability to verbalize understanding of medication therapies will improve Outcome: Progressing Goal: Individualized Educational Video(s) Outcome: Progressing   Problem: Activity: Goal: Capacity to carry out activities will improve Outcome: Progressing   Problem: Cardiac: Goal: Ability to achieve and maintain adequate cardiopulmonary perfusion will improve Outcome: Progressing

## 2019-12-21 NOTE — Plan of Care (Signed)
  Problem: Education: Goal: Knowledge of General Education information will improve Description: Including pain rating scale, medication(s)/side effects and non-pharmacologic comfort measures Outcome: Progressing   Problem: Health Behavior/Discharge Planning: Goal: Ability to manage health-related needs will improve Outcome: Progressing   Problem: Clinical Measurements: Goal: Will remain free from infection Outcome: Progressing Goal: Diagnostic test results will improve Outcome: Progressing Goal: Respiratory complications will improve Outcome: Progressing Goal: Cardiovascular complication will be avoided Outcome: Progressing   Problem: Activity: Goal: Risk for activity intolerance will decrease Outcome: Progressing   Problem: Elimination: Goal: Will not experience complications related to bowel motility Outcome: Progressing   Problem: Pain Managment: Goal: General experience of comfort will improve Outcome: Progressing   Problem: Safety: Goal: Ability to remain free from injury will improve Outcome: Progressing   Problem: Skin Integrity: Goal: Risk for impaired skin integrity will decrease Outcome: Progressing   Problem: Education: Goal: Ability to demonstrate management of disease process will improve Outcome: Progressing Goal: Ability to verbalize understanding of medication therapies will improve Outcome: Progressing Goal: Individualized Educational Video(s) Outcome: Progressing   Problem: Activity: Goal: Capacity to carry out activities will improve Outcome: Progressing   Problem: Cardiac: Goal: Ability to achieve and maintain adequate cardiopulmonary perfusion will improve Outcome: Progressing

## 2019-12-22 DIAGNOSIS — M25561 Pain in right knee: Secondary | ICD-10-CM

## 2019-12-22 LAB — CBC
HCT: 37.8 % (ref 36.0–46.0)
Hemoglobin: 11.4 g/dL — ABNORMAL LOW (ref 12.0–15.0)
MCH: 26.9 pg (ref 26.0–34.0)
MCHC: 30.2 g/dL (ref 30.0–36.0)
MCV: 89.2 fL (ref 80.0–100.0)
Platelets: 341 10*3/uL (ref 150–400)
RBC: 4.24 MIL/uL (ref 3.87–5.11)
RDW: 14.4 % (ref 11.5–15.5)
WBC: 8.7 10*3/uL (ref 4.0–10.5)
nRBC: 0 % (ref 0.0–0.2)

## 2019-12-22 LAB — HEPATIC FUNCTION PANEL
ALT: 99 U/L — ABNORMAL HIGH (ref 0–44)
AST: 39 U/L (ref 15–41)
Albumin: 2.5 g/dL — ABNORMAL LOW (ref 3.5–5.0)
Alkaline Phosphatase: 41 U/L (ref 38–126)
Bilirubin, Direct: 0.1 mg/dL (ref 0.0–0.2)
Indirect Bilirubin: 0.4 mg/dL (ref 0.3–0.9)
Total Bilirubin: 0.5 mg/dL (ref 0.3–1.2)
Total Protein: 5.2 g/dL — ABNORMAL LOW (ref 6.5–8.1)

## 2019-12-22 LAB — BASIC METABOLIC PANEL
Anion gap: 10 (ref 5–15)
BUN: 69 mg/dL — ABNORMAL HIGH (ref 8–23)
CO2: 34 mmol/L — ABNORMAL HIGH (ref 22–32)
Calcium: 8.7 mg/dL — ABNORMAL LOW (ref 8.9–10.3)
Chloride: 94 mmol/L — ABNORMAL LOW (ref 98–111)
Creatinine, Ser: 1.4 mg/dL — ABNORMAL HIGH (ref 0.44–1.00)
GFR calc Af Amer: 39 mL/min — ABNORMAL LOW (ref >60)
GFR calc non Af Amer: 34 mL/min — ABNORMAL LOW (ref >60)
Glucose, Bld: 287 mg/dL — ABNORMAL HIGH (ref 70–99)
Potassium: 4.6 mmol/L (ref 3.5–5.1)
Sodium: 138 mmol/L (ref 135–145)

## 2019-12-22 LAB — GLUCOSE, CAPILLARY
Glucose-Capillary: 179 mg/dL — ABNORMAL HIGH (ref 70–99)
Glucose-Capillary: 262 mg/dL — ABNORMAL HIGH (ref 70–99)
Glucose-Capillary: 231 mg/dL — ABNORMAL HIGH (ref 70–99)
Glucose-Capillary: 348 mg/dL — ABNORMAL HIGH (ref 70–99)

## 2019-12-22 MED ORDER — PREDNISONE 20 MG PO TABS
30.0000 mg | ORAL_TABLET | Freq: Every day | ORAL | Status: DC
  Administered 2019-12-23: 30 mg via ORAL
  Filled 2019-12-22: qty 1

## 2019-12-22 MED ORDER — GABAPENTIN 100 MG PO CAPS
100.0000 mg | ORAL_CAPSULE | Freq: Three times a day (TID) | ORAL | Status: DC
  Administered 2019-12-22 – 2019-12-24 (×6): 100 mg via ORAL
  Filled 2019-12-22 (×6): qty 1

## 2019-12-22 NOTE — Progress Notes (Signed)
Occupational Therapy Treatment Patient Details Name: [name] MRN: [id number] DOB: 1931/08/08 Today's Date: 12/22/2019    History of present illness Pt is an 86 y.o. female admitted 12/10/19 with worsenign SOB and BLE edema, also hypoxic. Worked up for acute hypoxic respiratory failure secondary to decompensated HF. 2/11 pt with onset of bilateral foot/lower leg pain limiting ability to mobilize. PMH includes dCHF, CKD 3, COPD, HTN.   OT comments  Co-treat with PT Session focused on progression of OOB mobility and standing level ADLs. Pt limited by R knee pain, only able to perform about ~5 ft of functional mobility before requiring a seated rest break- RN alerted and provided pain medication as requested by pt. D/c remains appropriate and OT will continue to follow.    Follow Up Recommendations  No OT follow up;Supervision - Intermittent    Equipment Recommendations  None recommended by OT       Precautions / Restrictions Precautions Precautions: Fall Restrictions Weight Bearing Restrictions: No       Mobility Bed Mobility Overal bed mobility: Needs Assistance Bed Mobility: Supine to Sit Rolling: Min guard         General bed mobility comments: Use of rails and min cueing for supine > EOB  Transfers Overall transfer level: Needs assistance Equipment used: Rolling walker (2 wheeled) Transfers: Sit to/from [facility] Sit to Stand: Min assist Stand pivot transfers: Min assist       General transfer comment: cues for sequencing, assist to power up    Balance Overall balance assessment: Needs assistance Sitting-balance support: Feet supported;No upper extremity supported Sitting balance-Leahy Scale: Good     Standing balance support: Bilateral upper extremity supported;During functional activity Standing balance-Leahy Scale: Poor Standing balance comment: reliant on RW                           ADL either performed or assessed with  clinical judgement   ADL Overall ADL's : Needs assistance/impaired Eating/Feeding: Independent   Grooming: Min guard;Standing;Oral care Grooming Details (indicate cue type and reason): Pt able to stand at the sink with min guard to perform oral care, holding onto sink for support                             Functional mobility during ADLs: Minimal assistance;Rolling walker General ADL Comments: Min A overall for very short distance functional mobility 2/2 pain                Cognition Arousal/Alertness: Awake/alert Behavior During Therapy: WFL for tasks assessed/performed Overall Cognitive Status: Within Functional Limits for tasks assessed                                                General Comments 2L [place] throughout session, all VSS    Pertinent Vitals/ Pain       Pain Assessment: 0-10 Pain Score: 7  Pain Location: R knee Pain Descriptors / Indicators: Grimacing;Guarding Pain Intervention(s): Monitored during session;RN gave pain meds during session         Frequency  Min 2X/week        Progress Toward Goals  OT Goals(current goals can now be found in the care plan section)  Progress towards OT goals: Progressing toward goals  Acute  Rehab OT Goals Patient Stated Goal: home OT Goal Formulation: With patient Time For Goal Achievement: 12/25/19 Potential to Achieve Goals: Good  Plan Discharge plan remains appropriate;Frequency remains appropriate    Co-evaluation    PT/OT/SLP Co-Evaluation/Treatment: Yes Reason for Co-Treatment: For patient/therapist safety;To address functional/ADL transfers   OT goals addressed during session: ADL's and self-care      AM-PAC OT "6 Clicks" Daily Activity     Outcome Measure   Help from another person eating meals?: None Help from another person taking care of personal grooming?: A Little Help from another person toileting, which includes using toliet, bedpan, or urinal?: A  Little Help from another person bathing (including washing, rinsing, drying)?: A Little Help from another person to put on and taking off regular upper body clothing?: A Little Help from another person to put on and taking off regular lower body clothing?: A Little 6 Click Score: 19    End of Session Equipment Utilized During Treatment: Rolling walker;Oxygen  OT Visit Diagnosis: Unsteadiness on feet (R26.81)   Activity Tolerance Patient limited by pain   Patient Left in chair;with call bell/phone within reach   Nurse Communication Patient requests pain meds;Mobility status        Time: [id number] OT Time Calculation (min): 22 min  Charges: OT General Charges $OT Visit: 1 Visit OT Treatments $Self Care/Home Management : 8-22 mins   Curtis Sites OTR/L  12/22/2019, 10:47 AM

## 2019-12-22 NOTE — Progress Notes (Signed)
Physical Therapy Treatment Patient Details Name: [name] MRN: [id number] DOB: October 09, 1931 Today's Date: 12/22/2019    History of Present Illness Pt is an 86 y.o. female admitted 12/10/19 with worsenign SOB and BLE edema, also hypoxic. Worked up for acute hypoxic respiratory failure secondary to decompensated HF. 2/11 pt with onset of bilateral foot/lower leg pain limiting ability to mobilize. PMH includes dCHF, CKD 3, COPD, HTN.    PT Comments    Patient seen in am with OT, then returned later for PT only due to had pain in R knee limiting ambulation, pain meds given during am session.  Still with limited ambulation, but increased distance tolerated.  Feel patient is high fall risk unless son able to provide assistance at home.  She has w/c, but used to ambulating in the home.  Will continue acute skilled PT and encourage her to accept PT follow up.  Seems she feels she will be inpatient until strong enough to go home.    Follow Up Recommendations  No PT follow up;Supervision for mobility/OOB(patient/family declining HHPT)     Equipment Recommendations  None recommended by PT    Recommendations for Other Services       Precautions / Restrictions Precautions Precautions: Fall Precaution Comments: hx of falls    Mobility  Bed Mobility Overal bed mobility: Needs Assistance Bed Mobility: Supine to Sit     Supine to sit: Min guard;HOB elevated     General bed mobility comments: Use of rails and min cueing for supine > EOB  Transfers Overall transfer level: Needs assistance Equipment used: Rolling walker (2 wheeled) Transfers: Sit to/from [facility] Sit to Stand: Min assist Stand pivot transfers: Min assist       General transfer comment: cues for hand placement, assist for balance, anterior weight shift  Ambulation/Gait Ambulation/Gait assistance: Min assist Gait Distance (Feet): 4 Feet(20' second session) Assistive device: Rolling walker (2  wheeled) Gait Pattern/deviations: Step-to pattern;Decreased stride length;Antalgic;Decreased stance time - right     General Gait Details: on 2L O2 SpO2 WFL, first session with OT 4' then performed standing activity at sink, second session with PT only 20' RW and cues for proximity to walker   Stairs             Wheelchair Mobility    Modified Rankin (Stroke Patients Only)       Balance Overall balance assessment: Needs assistance   Sitting balance-Leahy Scale: Good     Standing balance support: Bilateral upper extremity supported;During functional activity Standing balance-Leahy Scale: Poor Standing balance comment: UE support on RW or on sink for standing ADL                            Cognition   Behavior During Therapy: WFL for tasks assessed/performed Overall Cognitive Status: Within Functional Limits for tasks assessed                                        Exercises Other Exercises Other Exercises: demonstrates seated therex for keeping knee from stiffening up in chair    General Comments        Pertinent Vitals/Pain Pain Score: 7  Pain Location: R knee Pain Descriptors / Indicators: Grimacing;Guarding Pain Intervention(s): Monitored during session;Repositioned;Limited activity within patient's tolerance;RN gave pain meds during session    Home Living  Prior Function            PT Goals (current goals can now be found in the care plan section) Progress towards PT goals: Progressing toward goals    Frequency    Min 3X/week      PT Plan Current plan remains appropriate    Co-evaluation PT/OT/SLP Co-Evaluation/Treatment: Yes Reason for Co-Treatment: For patient/therapist safety;To address functional/ADL transfers PT goals addressed during session: Mobility/safety with mobility;Balance        AM-PAC PT "6 Clicks" Mobility   Outcome Measure  Help needed turning from your back  to your side while in a flat bed without using bedrails?: A Little Help needed moving from lying on your back to sitting on the side of a flat bed without using bedrails?: A Little Help needed moving to and from a bed to a chair (including a wheelchair)?: A Little Help needed standing up from a chair using your arms (e.g., wheelchair or bedside chair)?: A Little Help needed to walk in hospital room?: A Little Help needed climbing 3-5 steps with a railing? : A Lot 6 Click Score: 17    End of Session Equipment Utilized During Treatment: Oxygen Activity Tolerance: Patient limited by pain Patient left: in bed;with call bell/phone within reach(in chair after session with OT, then in bed after second session)   PT Visit Diagnosis: Other abnormalities of gait and mobility (R26.89);Pain;History of falling (Z91.81) Pain - Right/Left: Right Pain - part of body: Knee     Time: 1422-1449 PT Time Calculation (min) (ACUTE ONLY): 27 min  Charges:  $Gait Training: 8-22 mins $Therapeutic Activity: 8-22 mins                     Magda Kiel, [place] [facility] [phone] 12/22/2019    Reginia Naas 12/22/2019, 5:30 PM

## 2019-12-22 NOTE — Progress Notes (Signed)
PROGRESS NOTE    [name]    Code Status: DNR  HT:[id number] DOB: [birth year] DOA: 12/10/2019 LOS: 11 days  PCP: Hoyt Koch, MD CC:  Chief Complaint  Patient presents with  . Shortness of Breath       Hospital Summary  [name] is a 86 y.o. female with a history of recurrent, CKD 3, type 2 diabetes, hypertension, COPD who had increased shortness of breath and bilateral lower extremity edema associated with cough and hypoxia, SPO2 70s on presentation.  Admitted on 2/5 with acute hypoxic respiratory failure secondary to CHF exacerbation.  Treated with diuresis/supportive care.  2/11 patient complaining of severe bilateral leg/foot pain thought secondary to peripheral neuropathy.  Ultrasound of lower extremities negative for DVT, home gabapentin was initially increased but was cut back due to lethargy and increasing creatinine level.  Continues to have neuropathic symptoms.  Found to have elevated uric acid level has been started on treatment for gout with steroids, currently on p.o.  Treated for E. coli UTI with 3 days ceftriaxone as well.  Currently improving medically however still having significant pain in bilateral feet and having difficulty with ambulation.  A & P   Principal Problem:   Acute respiratory failure with hypoxia (HCC) Active Problems:   Acute on chronic diastolic CHF (congestive heart failure) (HCC)   Diabetic polyneuropathy associated with type 2 diabetes mellitus (HCC)   Benign hypertension with CKD (chronic kidney disease) stage III   Acute lower UTI   1. Acute hypoxic respiratory failure secondary to HFpEF exacerbation a. Has improved significantly with diuresis b. Continue Lasix 80 mg daily c. Continues to require O2 via nasal cannula 2. Neuropathic foot pain bilaterally a. Currently off gabapentin b. will add renally dosed gabapentin 100 mg 3 times daily 3. Right knee pain concern for gout or pseudogout a. Uric acid level elevated at  10.3 b. Significant improvement with steroids, currently day 6 on p.o. c. Continue prednisone taper, likely 7 to 10 days total steroids pending clinical improvement d. Continue allopurinol e. will need PCP follow-up at discharge f. PT/OT without further recommendations however patient does require assist and has poor ambulation.  Son states patient is much more independent at baseline compared to current status and is concerned about taking her home and current state.  Continue working with PT 4. AKI on CKD 3B with metabolic alkalosis, likely due to cardiorenal and from hemodynamic changes a. Baseline creatinine 1.2-1.4, currently 1.4 b. Continue diuresis 5. Diabetes a. HA1C 6.7 b. Continues to have steroid-induced hyperglycemia c. Continue current insulin regimen as prednisone is to be decreased tomorrow 6. Elevated LFTs, unknown etiology possibly from HFpEF exacerbation a. Nearly resolved, follow-up outpatient 7. Acute metabolic encephalopathy, likely medication and hypoxia induced, resolved 8. Pressure injury present on admission a. Wound care  DVT prophylaxis: Lovenox Family Communication: Patient's son has been updated over the phone Disposition Plan:   Patient came from:   Home                                                                               Anticipated d/c place: Home  Barriers to d/c: Full discharge in next 24 to  48 hours pending improvement in pain as well as improved strength/mobility   Pressure injury documentation   Pressure Injury 09/20/19 Buttocks Right Stage II -  Partial thickness loss of dermis presenting as a shallow open ulcer with a red, pink wound bed without slough. (Active)  09/20/19 2300  Location: Buttocks  Location Orientation: Right  Staging: Stage II -  Partial thickness loss of dermis presenting as a shallow open ulcer with a red, pink wound bed without slough.  Wound Description (Comments):   Present on Admission: Yes     Consultants   Ortho  Procedures  none  Antibiotics   Anti-infectives (From admission, onward)   Start     Dose/Rate Route Frequency Ordered Stop   12/11/19 0145  cefTRIAXone (ROCEPHIN) 1 g in sodium chloride 0.9 % 100 mL IVPB     1 g 200 mL/hr over 30 Minutes Intravenous Daily 12/11/19 0137 12/13/19 2106        Subjective   Is a very pleasant 86 year old female who I examined today at bedside and no acute distress resting comfortably.  States she continues to have bilateral neuropathy pain.  Having improved right knee pain with steroids.  Tolerating diet.  Not ambulating well.  Had a long discussion with patient's son today regarding current plan he was rightfully concerned about patient's clinical status.  Unfortunately is very concerned about her going home given that she is typically much more independent than she currently is and he is her sole caretaker at this point in time.  They have been trying to avoid having home health visit due to Covid restrictions.   Patient denies any chest pain, fever, chills, night sweats, nausea, vomiting or any other complaints at this time other than stated above.  Objective   Vitals:   12/22/19 0731 12/22/19 1400 12/22/19 1608 12/22/19 1700  BP: (!) 155/60  (!) 143/59   Pulse: 62 (!) 59 (!) 54   Resp:  17    Temp: 97.7 F (36.5 C)  97.9 F (36.6 C)   TempSrc:      SpO2: 99% (!) 88% 96% 97%  Weight:      Height:        Intake/Output Summary (Last 24 hours) at 12/22/2019 1818 Last data filed at 12/22/2019 1100 Gross per 24 hour  Intake 240 ml  Output 800 ml  Net -560 ml   Filed Weights   12/19/19 1737 12/20/19 0500 12/21/19 0300  Weight: 91.8 kg 88.8 kg 87.6 kg    Examination:  Physical Exam Vitals and nursing note reviewed.  Constitutional:      Appearance: Normal appearance.  HENT:     Head: Normocephalic and atraumatic.  Eyes:     Conjunctiva/sclera: Conjunctivae normal.  Cardiovascular:     Rate and Rhythm: Normal rate and  regular rhythm.  Pulmonary:     Effort: Pulmonary effort is normal.     Breath sounds: Normal breath sounds. No wheezing.     Comments: On 2 L nasal cannula Abdominal:     General: Abdomen is flat.     Palpations: Abdomen is soft.  Musculoskeletal:        General: No swelling or tenderness.     Right lower leg: No edema.     Left lower leg: No edema.  Skin:    Coloration: Skin is not jaundiced or pale.  Neurological:     Mental Status: She is alert. Mental status is at baseline.  Psychiatric:  Mood and Affect: Mood normal.        Behavior: Behavior normal.     Data Reviewed: I have personally reviewed following labs and imaging studies  CBC: Recent Labs  Lab 12/18/19 0723 12/19/19 0330 12/20/19 0748 12/21/19 0157 12/22/19 1024  WBC 14.3* 13.2* 10.6* 8.4 8.7  NEUTROABS 13.6*  --   --   --   --   HGB 10.9* 10.5* 10.7* 10.8* 11.4*  HCT 36.1 34.0* 34.7* 35.6* 37.8  MCV 88.3 87.2 87.0 87.7 89.2  PLT 245 270 305 305 [id number]   Basic Metabolic Panel: Recent Labs  Lab 12/18/19 0723 12/19/19 0330 12/20/19 0748 12/21/19 0157 12/22/19 1024  NA 134* 130* 136 132* 138  K 4.9 4.8 5.0 5.1 4.6  CL 88* 84* 88* 88* 94*  CO2 33* 33* 35* 35* 34*  GLUCOSE 211* 296* 227* 324* 287*  BUN 53* 71* 81* 85* 69*  CREATININE 1.61* 1.60* 1.60* 1.68* 1.40*  CALCIUM 8.9 8.8* 9.1 8.9 8.7*   GFR: Estimated Creatinine Clearance: 30.9 mL/min (A) (by C-G formula based on SCr of 1.4 mg/dL (H)). Liver Function Tests: Recent Labs  Lab 12/18/19 0723 12/19/19 0330 12/20/19 0748  AST 25 68* 111*  ALT 24 58* 139*  ALKPHOS 49 48 48  BILITOT 1.0 0.7 0.6  PROT 5.5* 5.4* 5.4*  ALBUMIN 2.3* 2.3* 2.3*   No results for input(s): LIPASE, AMYLASE in the last 168 hours. Recent Labs  Lab 12/17/19 1215  AMMONIA 20   Coagulation Profile: No results for input(s): INR, PROTIME in the last 168 hours. Cardiac Enzymes: No results for input(s): CKTOTAL, CKMB, CKMBINDEX, TROPONINI in the last 168  hours. BNP (last 3 results) No results for input(s): PROBNP in the last 8760 hours. HbA1C: No results for input(s): HGBA1C in the last 72 hours. CBG: Recent Labs  Lab 12/21/19 1632 12/21/19 2053 12/22/19 0731 12/22/19 1128 12/22/19 1609  GLUCAP 347* 369* 179* 262* 231*   Lipid Profile: No results for input(s): CHOL, HDL, LDLCALC, TRIG, CHOLHDL, LDLDIRECT in the last 72 hours. Thyroid Function Tests: No results for input(s): TSH, T4TOTAL, FREET4, T3FREE, THYROIDAB in the last 72 hours. Anemia Panel: No results for input(s): VITAMINB12, FOLATE, FERRITIN, TIBC, IRON, RETICCTPCT in the last 72 hours. Sepsis Labs: No results for input(s): PROCALCITON, LATICACIDVEN in the last 168 hours.  Recent Results (from the past 240 hour(s))  Culture, Urine     Status: None   Collection Time: 12/17/19  7:00 PM   Specimen: Urine, Random  Result Value Ref Range Status   Specimen Description URINE, RANDOM  Final   Special Requests NONE  Final   Culture   Final    NO GROWTH Performed at [facility], [address] [place],  [address]    Report Status 12/19/2019 FINAL  Final         Radiology Studies: No results found.      Scheduled Meds: . allopurinol  50 mg Oral Daily  . amLODipine  5 mg Oral Daily  . aspirin EC  81 mg Oral q morning - 10a  . atorvastatin  40 mg Oral q1800  . enoxaparin (LOVENOX) injection  30 mg Subcutaneous QHS  . feeding supplement (GLUCERNA SHAKE)  237 mL Oral BID BM  . fluticasone  2 spray Each Nare Daily  . furosemide  80 mg Oral Daily  . gabapentin  100 mg Oral TID  . hydrALAZINE  25 mg Oral BID  . insulin aspart  0-9 Units  Subcutaneous TID WC  . insulin aspart  5 Units Subcutaneous TID WC  . insulin glargine  30 Units Subcutaneous QHS  . metoprolol tartrate  25 mg Oral BID  . multivitamin with minerals  1 tablet Oral Daily  . polyethylene glycol  17 g Oral BID  . predniSONE  40 mg Oral Q breakfast  . sodium chloride flush  3 mL  Intravenous Q12H   Continuous Infusions: . sodium chloride       Time spent: 45 minutes with over 50% of the time coordinating the patient's care    Harold Hedge, DO [facility] Pager [phone]  Call night coverage person covering after 7pm

## 2019-12-23 LAB — COMPREHENSIVE METABOLIC PANEL
ALT: 83 U/L — ABNORMAL HIGH (ref 0–44)
AST: 26 U/L (ref 15–41)
Albumin: 2.5 g/dL — ABNORMAL LOW (ref 3.5–5.0)
Alkaline Phosphatase: 43 U/L (ref 38–126)
Anion gap: 10 (ref 5–15)
BUN: 64 mg/dL — ABNORMAL HIGH (ref 8–23)
CO2: 33 mmol/L — ABNORMAL HIGH (ref 22–32)
Calcium: 8.8 mg/dL — ABNORMAL LOW (ref 8.9–10.3)
Chloride: 91 mmol/L — ABNORMAL LOW (ref 98–111)
Creatinine, Ser: 1.48 mg/dL — ABNORMAL HIGH (ref 0.44–1.00)
GFR calc Af Amer: 37 mL/min — ABNORMAL LOW (ref >60)
GFR calc non Af Amer: 32 mL/min — ABNORMAL LOW (ref >60)
Glucose, Bld: 407 mg/dL — ABNORMAL HIGH (ref 70–99)
Potassium: 4.9 mmol/L (ref 3.5–5.1)
Sodium: 134 mmol/L — ABNORMAL LOW (ref 135–145)
Total Bilirubin: 0.9 mg/dL (ref 0.3–1.2)
Total Protein: 5.2 g/dL — ABNORMAL LOW (ref 6.5–8.1)

## 2019-12-23 LAB — CBC
HCT: 37.1 % (ref 36.0–46.0)
Hemoglobin: 11.1 g/dL — ABNORMAL LOW (ref 12.0–15.0)
MCH: 26.5 pg (ref 26.0–34.0)
MCHC: 29.9 g/dL — ABNORMAL LOW (ref 30.0–36.0)
MCV: 88.5 fL (ref 80.0–100.0)
Platelets: 342 10*3/uL (ref 150–400)
RBC: 4.19 MIL/uL (ref 3.87–5.11)
RDW: 14.5 % (ref 11.5–15.5)
WBC: 9 10*3/uL (ref 4.0–10.5)
nRBC: 0 % (ref 0.0–0.2)

## 2019-12-23 LAB — GLUCOSE, CAPILLARY
Glucose-Capillary: 275 mg/dL — ABNORMAL HIGH (ref 70–99)
Glucose-Capillary: 268 mg/dL — ABNORMAL HIGH (ref 70–99)
Glucose-Capillary: 220 mg/dL — ABNORMAL HIGH (ref 70–99)
Glucose-Capillary: 231 mg/dL — ABNORMAL HIGH (ref 70–99)
Glucose-Capillary: 235 mg/dL — ABNORMAL HIGH (ref 70–99)

## 2019-12-23 LAB — MAGNESIUM: Magnesium: 2.6 mg/dL — ABNORMAL HIGH (ref 1.7–2.4)

## 2019-12-23 MED ORDER — INSULIN ASPART 100 UNIT/ML ~~LOC~~ SOLN
0.0000 [IU] | Freq: Three times a day (TID) | SUBCUTANEOUS | Status: DC
  Administered 2019-12-23: 8 [IU] via SUBCUTANEOUS
  Administered 2019-12-23: 5 [IU] via SUBCUTANEOUS
  Administered 2019-12-24: 3 [IU] via SUBCUTANEOUS
  Administered 2019-12-24: 5 [IU] via SUBCUTANEOUS

## 2019-12-23 MED ORDER — INSULIN ASPART 100 UNIT/ML ~~LOC~~ SOLN
8.0000 [IU] | Freq: Three times a day (TID) | SUBCUTANEOUS | Status: DC
  Administered 2019-12-23 – 2019-12-24 (×4): 8 [IU] via SUBCUTANEOUS

## 2019-12-23 NOTE — Progress Notes (Signed)
Received consult for home oxygen; please document 02 saturation for qualifying for home oxygen;   Please document in the following format per DME requirements   SATURATION QUALIFICATIONS: (This note is used to comply with regulatory documentation for home oxygen)  Patient Saturations on Room Air at Rest = %  Patient Saturations on Room Air while Ambulating = %  Patient Saturations on  Liters of oxygen while Ambulating = %  Please briefly explain why patient needs home oxygen:  If patient qualifies for home oxygen, Attending MD, please place order for home 02 under DME for liter flow,continuous, ; and please call the CM for arrangements prior to discharge home. [place] Supervisor [phone]

## 2019-12-23 NOTE — Progress Notes (Signed)
Inpatient Diabetes Program Recommendations  AACE/ADA: New Consensus Statement on Inpatient Glycemic Control (2015)  Target Ranges:  Prepandial:   less than 140 mg/dL      Peak postprandial:   less than 180 mg/dL (1-2 hours)      Critically ill patients:  140 - 180 mg/dL   Lab Results  Component Value Date   GLUCAP 275 (H) 12/23/2019   HGBA1C 6.7 (H) 12/11/2019    Review of Glycemic Control  Results for ROSALIE, MIDDLEBROOK (MRN [id number]) as of 12/23/2019 09:06  Ref. Range 12/22/2019 07:31 12/22/2019 11:28 12/22/2019 16:09 12/22/2019 21:20 12/23/2019 07:55  Glucose-Capillary Latest Ref Range: 70 - 99 mg/dL 179 (H) 262 (H) 231 (H) 348 (H) 275 (H)   History:DM, CHF, CKD 3, COPD   Home DM Meds: Lantus 20unitsQHS Novolog 3-8 unitsTID per SSI  Glipizide 5 mg Daily  CurrentOrders: Lantus 30unitsQHS Novolog Sensitive Correction Scale/ SSI (0-15 units) TID AC Novolog 5 units TID with meals Steroids: prednisone 30 mg daily    Inpatient Diabetes Program Recommendations:    Noted that SSI correction was increased to 0-15 units TID today and prednisone decreased to 30 mg daily from 40 mg daily.  MD, please consider Novolog 8 units TID with meals if eats at least 50% of meal   Thank you, Reche Dixon, RN, BSN Diabetes Coordinator Inpatient Diabetes Program [phone] (team pager from 8a-5p)

## 2019-12-23 NOTE — Progress Notes (Signed)
Physical Therapy Treatment Patient Details Name: [name] MRN: [id number] DOB: [birth year] Today's Date: 12/23/2019    History of Present Illness Pt is an 86 y.o. female admitted 12/10/19 with worsenign SOB and BLE edema, also hypoxic. Worked up for acute hypoxic respiratory failure secondary to decompensated HF. 2/11 pt with onset of bilateral foot/lower leg pain limiting ability to mobilize. PMH includes dCHF, CKD 3, COPD, HTN.    PT Comments    Patient seen for mobility progression. Pt is pleasant and eager to mobilize. Pt tolerated increased gait distance this session and overall requires min guard/min A for OOB with RW. Pt with DOE while ambulating and first seated break taken after ambulating 65 ft. SpO2 90% or > on RA during session using hand held pulse oximeter. PT will continue to follow acutely and progress as tolerated.     Follow Up Recommendations  No PT follow up;Supervision for mobility/OOB(patient/family declining HHPT)     Equipment Recommendations  None recommended by PT    Recommendations for Other Services       Precautions / Restrictions Precautions Precautions: Fall Precaution Comments: hx of falls Restrictions Weight Bearing Restrictions: No    Mobility  Bed Mobility               General bed mobility comments: pt OOB in chair upon arrival  Transfers Overall transfer level: Needs assistance Equipment used: Rolling walker (2 wheeled) Transfers: Sit to/from Stand Sit to Stand: Min guard         General transfer comment: pt demonstrates safe hand placement from recliner; min guard for safety to stand but no physical assistance required   Ambulation/Gait Ambulation/Gait assistance: Min assist;Min guard Gait Distance (Feet): (~140 ft total with 2 seated breaks ) Assistive device: Rolling walker (2 wheeled) Gait Pattern/deviations: Decreased stride length;Antalgic;Decreased stance time - right;Step-through pattern;Step-to pattern;Trunk  flexed Gait velocity: decreased   General Gait Details: initial bout 65 ft and then seated break required due to fatigue and DOE; pt began to have R knee at around this point however agreeable to ambulate farther; grossly min guard for safety and min A last 25 ft due to fatigue and increased knee pain; cues for maintaining safe proximity to RW and for PLB   Stairs             Wheelchair Mobility    Modified Rankin (Stroke Patients Only)       Balance Overall balance assessment: Needs assistance Sitting-balance support: Feet supported;No upper extremity supported Sitting balance-Leahy Scale: Good     Standing balance support: Bilateral upper extremity supported;During functional activity Standing balance-Leahy Scale: Poor                              Cognition Arousal/Alertness: Awake/alert Behavior During Therapy: WFL for tasks assessed/performed Overall Cognitive Status: Within Functional Limits for tasks assessed                                        Exercises      General Comments General comments (skin integrity, edema, etc.): SpO2 90% or > on RA however pt more SOB on RA with mobility vs with 1L O2 via       Pertinent Vitals/Pain Pain Assessment: Faces Faces Pain Scale: Hurts a little bit Pain Location: R knee Pain Descriptors / Indicators: Grimacing;Guarding;Sore Pain Intervention(s): Limited  activity within patient's tolerance;Monitored during session;Premedicated before session;Repositioned    Home Living                      Prior Function            PT Goals (current goals can now be found in the care plan section) Progress towards PT goals: Progressing toward goals    Frequency    Min 3X/week      PT Plan Current plan remains appropriate    Co-evaluation              AM-PAC PT "6 Clicks" Mobility   Outcome Measure  Help needed turning from your back to your side while in a flat bed  without using bedrails?: A Little Help needed moving from lying on your back to sitting on the side of a flat bed without using bedrails?: A Little Help needed moving to and from a bed to a chair (including a wheelchair)?: A Little Help needed standing up from a chair using your arms (e.g., wheelchair or bedside chair)?: A Little Help needed to walk in hospital room?: A Little Help needed climbing 3-5 steps with a railing? : A Lot 6 Click Score: 17    End of Session Equipment Utilized During Treatment: Oxygen;Gait belt Activity Tolerance: Patient tolerated treatment well Patient left: with call bell/phone within reach;in chair Nurse Communication: Mobility status PT Visit Diagnosis: Other abnormalities of gait and mobility (R26.89);Pain;History of falling (Z91.81) Pain - Right/Left: Right Pain - part of body: Knee     Time: [id number] PT Time Calculation (min) (ACUTE ONLY): 22 min  Charges:  $Gait Training: 8-22 mins                     Earney Navy, PTA Acute Rehabilitation Services Pager: [phone] Office: [phone]     Darliss Cheney 12/23/2019, 1:26 PM
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
## 2019-12-24 LAB — BASIC METABOLIC PANEL
Anion gap: 10 (ref 5–15)
BUN: 47 mg/dL — ABNORMAL HIGH (ref 8–23)
CO2: 35 mmol/L — ABNORMAL HIGH (ref 22–32)
Calcium: 8.9 mg/dL (ref 8.9–10.3)
Chloride: 94 mmol/L — ABNORMAL LOW (ref 98–111)
Creatinine, Ser: 1.33 mg/dL — ABNORMAL HIGH (ref 0.44–1.00)
GFR calc Af Amer: 42 mL/min — ABNORMAL LOW (ref >60)
GFR calc non Af Amer: 36 mL/min — ABNORMAL LOW (ref >60)
Glucose, Bld: 268 mg/dL — ABNORMAL HIGH (ref 70–99)
Potassium: 4.6 mmol/L (ref 3.5–5.1)
Sodium: 139 mmol/L (ref 135–145)

## 2019-12-24 LAB — MAGNESIUM: Magnesium: 2.5 mg/dL — ABNORMAL HIGH (ref 1.7–2.4)

## 2019-12-24 LAB — CBC
HCT: 37 % (ref 36.0–46.0)
Hemoglobin: 11.2 g/dL — ABNORMAL LOW (ref 12.0–15.0)
MCH: 26.9 pg (ref 26.0–34.0)
MCHC: 30.3 g/dL (ref 30.0–36.0)
MCV: 88.9 fL (ref 80.0–100.0)
Platelets: 350 10*3/uL (ref 150–400)
RBC: 4.16 MIL/uL (ref 3.87–5.11)
RDW: 14.4 % (ref 11.5–15.5)
WBC: 11.7 10*3/uL — ABNORMAL HIGH (ref 4.0–10.5)
nRBC: 0 % (ref 0.0–0.2)

## 2019-12-24 LAB — GLUCOSE, CAPILLARY
Glucose-Capillary: 227 mg/dL — ABNORMAL HIGH (ref 70–99)
Glucose-Capillary: 168 mg/dL — ABNORMAL HIGH (ref 70–99)
Glucose-Capillary: 254 mg/dL — ABNORMAL HIGH (ref 70–99)

## 2019-12-24 MED ORDER — PREDNISONE 20 MG PO TABS
20.0000 mg | ORAL_TABLET | Freq: Every day | ORAL | Status: DC
  Administered 2019-12-24: 20 mg via ORAL
  Filled 2019-12-24: qty 1

## 2019-12-24 MED ORDER — ALLOPURINOL 100 MG PO TABS: 50.0000 mg | ORAL_TABLET | Freq: Every day | ORAL | 0 refills | Status: DC

## 2019-12-24 MED ORDER — PREDNISONE 20 MG PO TABS: ORAL_TABLET | ORAL | 0 refills | Status: AC

## 2019-12-24 MED ORDER — GABAPENTIN 100 MG PO CAPS: 100.0000 mg | ORAL_CAPSULE | Freq: Three times a day (TID) | ORAL | 0 refills | Status: DC

## 2019-12-24 MED ORDER — INSULIN GLARGINE 100 UNIT/ML ~~LOC~~ SOLN: 25.0000 [IU] | Freq: Every day | SUBCUTANEOUS | 1 refills | Status: DC

## 2019-12-24 NOTE — TOC Transition Note (Signed)
Transition of Care [facility]) - CM/SW Discharge Note   Patient Details  Name: MASAYO HANIF MRN: [id number] Date of Birth: December 11, 1930  [facility] CM/SW Contact:  Maryclare Labrador, RN Phone Number: 12/24/2019, 1:27 PM   Clinical Narrative:   Pt deemed appropriate for discharge home today with son.  CM contacted pt however she deferred [place] questions to son.  CM was able reach pt via phone.  Son continues to decline HH -  CM explained to son that PCP can also arrange [place] as well.  Son in agreement with home oxygen - chose given - [facility] chosen - agency accepts referral.  [facility] will provide portable oxygen concentrator prior to discharge so pt doesn't need to delay in hospital, agency will provide home equipment directly to home.   No other CM needs determined - CM signing off     Final next level of care: Home/Self Care Barriers to Discharge: No Barriers Identified   Patient Goals and CMS Choice Patient states their goals for this hospitalization and ongoing recovery are:: to go home CMS Medicare.gov Compare Post Acute Care list provided to:: Patient Choice offered to / list presented to : Patient, Adult Children  Discharge Placement                       Discharge Plan and Services                DME Arranged: Oxygen DME Agency: [facility] Date DME Agency Contacted: 12/24/19 Time DME Agency Contacted: [id number] Representative spoke with at DME Agency: [place]: Patient Refused [place]          Social Determinants of Health ([place]) Interventions     Readmission Risk Interventions No flowsheet data found.

## 2019-12-24 NOTE — Progress Notes (Deleted)
Physician Discharge Summary  [name] [id number] DOB: 03-Apr-1931   PCP: Hoyt Koch, MD  Admit date: 12/10/2019 Discharge date: 12/24/2019 Length of Stay: 13 days   Code Status: DNR  Admitted From:  Home Discharged to:   [place]:  None  Equipment/Devices: O2 on ambulation Discharge Condition:  Stable  Recommendations for Outpatient Follow-up   1. Follow up with PCP in 1 week 2. Follow up BMP/CBC/uric acid 3. Follow-up right knee pain, follow-up sugars after being on prednisone 4. Lantus was increased to 25 units while on prednisone, can likely go back down to 20 units nightly 5. Reevaluate O2 need  Hospital Summary  [name] is a 86 y.o. female with a history of recurrent, CKD 3, type 2 diabetes, hypertension, COPD who had increased shortness of breath and bilateral lower extremity edema associated with cough and hypoxia, SPO2 70s on presentation.  Admitted on 2/5 with acute hypoxic respiratory failure secondary to CHF exacerbation.  Treated with diuresis/supportive care.  2/11 patient complaining of severe bilateral leg/foot pain thought secondary to peripheral neuropathy.  Ultrasound of lower extremities negative for DVT, home gabapentin was initially increased but was cut back due to lethargy and increasing creatinine level.  Continued to have neuropathic symptoms.  Found to have elevated uric acid level has been started on treatment for gout with steroids, discharged on p.o. also Treated for E. coli UTI with 3 days ceftriaxone as well.   She had renally dosed gabapentin reinstituted with significant improvement in neuropathic symptoms which helped improve her ambulation as well.  Patient required liters per minute nasal cannula O2 on ambulation and tolerated room air at rest.  She was discharged with incentive spirometry, O2, prednisone, allopurinol, increased Lantus while on steroids and advised to follow-up with lab work with PCP in the next week.  Family  updated at bedside prior to discharge.   A & P   Principal Problem:   Acute respiratory failure with hypoxia (HCC) Active Problems:   Acute on chronic diastolic CHF (congestive heart failure) (HCC)   Diabetic polyneuropathy associated with type 2 diabetes mellitus (HCC)   Benign hypertension with CKD (chronic kidney disease) stage III   Acute lower UTI   1. Acute hypoxic respiratory failure secondary to HFpEF exacerbation a. Has improved significantly with diuresis b. Continue Lasix 80 mg daily c. Discharged with O2 1 L/min nasal cannula 2. Neuropathic foot pain bilaterally a. Significant improvement with renally dosed gabapentin  b. I suspect this is, in part, leading to her ambulatory dysfunction and should continue to improve with improved neuropathic pain c. Discharged on gabapentin 100 mg 3 times daily 3. Right knee pain concern for gout or pseudogout improving a. Ortho was initially consulted however joint was not aspirated b. Uric acid level elevated at 10.3 c. Significant improvement with steroids, currently day 8 on p.o. prednisone taper, continue taper throughout the weekend and follow-up with PCP d. started on and discharged on allopurinol 4. AKI on CKD 3B with metabolic alkalosis, likely due to cardiorenal and from hemodynamic changes a. Baseline creatinine 1.2-1.4, currently 1.33 5. Diabetes a. HA1C 6.7 b. Continues to have steroid-induced hyperglycemia c. Discharged on steroid taper and Lantus 25 units nightly, continue home sliding scale and follow-up with Endo 6. Elevated LFTs, unknown etiology possibly from HFpEF exacerbation a. resolved, follow-up outpatient 7. Acute metabolic encephalopathy, likely medication and hypoxia induced, resolved 8. Pressure injury present on admission a. Outpatient follow-up    Consultants  . Ortho  Procedures  . None  Antibiotics   Anti-infectives (From admission, onward)   Start     Dose/Rate Route Frequency Ordered  Stop   12/11/19 0145  cefTRIAXone (ROCEPHIN) 1 g in sodium chloride 0.9 % 100 mL IVPB     1 g 200 mL/hr over 30 Minutes Intravenous Daily 12/11/19 0137 12/13/19 2106       Subjective  Patient seen and examined at bedside no acute distress and resting comfortably.  No events overnight.  Tolerating diet. In good spirits and anticipating discharge.   Denies any chest pain, shortness of breath at rest, fever, nausea, vomiting, urinary or bowel complaints. Otherwise ROS negative    Objective   Discharge Exam: Vitals:   12/24/19 0537 12/24/19 1000  BP:  (!) 155/59  Pulse:  (!) 55  Resp:  20  Temp:  97.8 F (36.6 C)  SpO2: 94% 91%   Vitals:   12/24/19 0400 12/24/19 0500 12/24/19 0537 12/24/19 1000  BP: (!) 139/56   (!) 155/59  Pulse: (!) 55   (!) 55  Resp: 20   20  Temp: 97.9 F (36.6 C)   97.8 F (36.6 C)  TempSrc: Oral   Oral  SpO2: 91%  94% 91%  Weight:  87.6 kg    Height:        Physical Exam Vitals and nursing note reviewed.  Constitutional:      Appearance: Normal appearance.  HENT:     Head: Normocephalic and atraumatic.  Eyes:     Conjunctiva/sclera: Conjunctivae normal.  Cardiovascular:     Rate and Rhythm: Normal rate and regular rhythm.  Pulmonary:     Effort: Pulmonary effort is normal.     Breath sounds: Normal breath sounds.     Comments: -Orthopnea Abdominal:     General: Abdomen is flat.     Palpations: Abdomen is soft.  Musculoskeletal:        General: No swelling or tenderness.  Skin:    Coloration: Skin is not jaundiced or pale.  Neurological:     Mental Status: She is alert. Mental status is at baseline.  Psychiatric:        Mood and Affect: Mood normal.        Behavior: Behavior normal.       The results of significant diagnostics from this hospitalization (including imaging, microbiology, ancillary and laboratory) are listed below for reference.     Microbiology: Recent Results (from the past 240 hour(s))  Culture, Urine      Status: None   Collection Time: 12/17/19  7:00 PM   Specimen: Urine, Random  Result Value Ref Range Status   Specimen Description URINE, RANDOM  Final   Special Requests NONE  Final   Culture   Final    NO GROWTH Performed at [facility], [address] [place], [place] [address]    Report Status 12/19/2019 FINAL  Final     Labs: BNP (last 3 results) Recent Labs    09/20/19 1347 12/10/19 2140  BNP 1,277.5* 0000000*   Basic Metabolic Panel: Recent Labs  Lab 12/20/19 0748 12/21/19 0157 12/22/19 1024 12/23/19 0300 12/24/19 0328  NA 136 132* 138 134* 139  K 5.0 5.1 4.6 4.9 4.6  CL 88* 88* 94* 91* 94*  CO2 35* 35* 34* 33* 35*  GLUCOSE 227* 324* 287* 407* 268*  BUN 81* 85* 69* 64* 47*  CREATININE 1.60* 1.68* 1.40* 1.48* 1.33*  CALCIUM 9.1 8.9 8.7* 8.8* 8.9  MG  --   --   --  2.6* 2.5*   Liver Function Tests: Recent Labs  Lab 12/18/19 0723 12/19/19 0330 12/20/19 0748 12/22/19 1047 12/23/19 0300  AST 25 68* 111* 39 26  ALT 24 58* 139* 99* 83*  ALKPHOS 49 48 48 41 43  BILITOT 1.0 0.7 0.6 0.5 0.9  PROT 5.5* 5.4* 5.4* 5.2* 5.2*  ALBUMIN 2.3* 2.3* 2.3* 2.5* 2.5*   No results for input(s): LIPASE, AMYLASE in the last 168 hours. No results for input(s): AMMONIA in the last 168 hours. CBC: Recent Labs  Lab 12/18/19 0723 12/19/19 0330 12/20/19 0748 12/21/19 0157 12/22/19 1024 12/23/19 0300 12/24/19 0328  WBC 14.3*   < > 10.6* 8.4 8.7 9.0 11.7*  NEUTROABS 13.6*  --   --   --   --   --   --   HGB 10.9*   < > 10.7* 10.8* 11.4* 11.1* 11.2*  HCT 36.1   < > 34.7* 35.6* 37.8 37.1 37.0  MCV 88.3   < > 87.0 87.7 89.2 88.5 88.9  PLT 245   < > 305 305 341 342 350   < > = values in this interval not displayed.   Cardiac Enzymes: No results for input(s): CKTOTAL, CKMB, CKMBINDEX, TROPONINI in the last 168 hours. BNP: Invalid input(s): POCBNP CBG: Recent Labs  Lab 12/23/19 1653 12/23/19 2238 12/24/19 0755 12/24/19 1152 12/24/19 1631  GLUCAP 220* 235* 227*  168* 254*   D-Dimer No results for input(s): DDIMER in the last 72 hours. Hgb A1c No results for input(s): HGBA1C in the last 72 hours. Lipid Profile No results for input(s): CHOL, HDL, LDLCALC, TRIG, CHOLHDL, LDLDIRECT in the last 72 hours. Thyroid function studies No results for input(s): TSH, T4TOTAL, T3FREE, THYROIDAB in the last 72 hours.  Invalid input(s): FREET3 Anemia work up No results for input(s): VITAMINB12, FOLATE, FERRITIN, TIBC, IRON, RETICCTPCT in the last 72 hours. Urinalysis    Component Value Date/Time   COLORURINE YELLOW 12/17/2019 1900   APPEARANCEUR CLEAR 12/17/2019 1900   LABSPEC 1.012 12/17/2019 1900   PHURINE 5.0 12/17/2019 1900   GLUCOSEU NEGATIVE 12/17/2019 1900   HGBUR NEGATIVE 12/17/2019 1900   BILIRUBINUR NEGATIVE 12/17/2019 1900   BILIRUBINUR Neg 08/06/2018 1540   KETONESUR NEGATIVE 12/17/2019 1900   PROTEINUR NEGATIVE 12/17/2019 1900   UROBILINOGEN 0.2 08/06/2018 1540   UROBILINOGEN Normal 07/18/2017 0000   NITRITE NEGATIVE 12/17/2019 1900   LEUKOCYTESUR NEGATIVE 12/17/2019 1900   Sepsis Labs Invalid input(s): PROCALCITONIN,  WBC,  LACTICIDVEN Microbiology Recent Results (from the past 240 hour(s))  Culture, Urine     Status: None   Collection Time: 12/17/19  7:00 PM   Specimen: Urine, Random  Result Value Ref Range Status   Specimen Description URINE, RANDOM  Final   Special Requests NONE  Final   Culture   Final    NO GROWTH Performed at [facility], [place] [address] [place], [place] [address]    Report Status 12/19/2019 FINAL  Final    Discharge Instructions     Discharge Instructions    Diet - low sodium heart healthy   Complete by: As directed    Discharge instructions   Complete by: As directed    You were seen and examined in the hospital for heart failure exacerbation and suspected gout and cared for by a hospitalist.   Upon Discharge:  - change how you take Gabapentin. Now take 100 mg three times daily -  Take allopurinol daily - Take Prednisone 20 mg once daily for two  days then 10 mg for two days, then you can stop. If you have worsening knee pain as this steroid is tapered down, call your primary care doctor as you may need to go back to increased steroid doses - Increase your Lantus to 25 units nightly while on prednisone and continue taking your sliding scale insulin and glipizide. You can likely go back to your normal Lantus 20 u nightly once your prednisone is done. Monitor your sugar carefully on this increased dose of insulin and other diabetic medications as this can cause your blood sugar to be low. If you are having shaking, chills, nausea and your blood sugar is less than 80, then have a sugary snack.  - Make an appointment with your primary care physician within 7 days - Get lab work prior to your follow up appointment with your PCP Bring all home medications to your appointment to review Request that your primary physician go over all hospital tests and procedures/radiological results at the follow up.   Please get all hospital records sent to your physician by signing a hospital release before you go home.   Read the complete instructions along with all the possible side effects for all the medicines you take and that have been prescribed to you. Take any new medicines after you have completely understood and accept all the possible adverse reactions/side effects.   If you have any questions about your discharge medications or the care you received while you were in the hospital, you can call the unit and asked to speak with the hospitalist on call. Once you are discharged, your primary care physician will handle any further medical issues. Please note that NO REFILLS for any discharge medications will be authorized, as it is imperative that you return to your primary care physician (or establish a relationship with a primary care physician if you do not have one) for your aftercare needs  so that they can reassess your need for medications and monitor your lab values.   Do not drive, operate heavy machinery, perform activities at heights, swimming or participation in water activities or provide baby sitting services if your were admitted for loss of consciousness/seizures or if you are on sedating medications including, but not limited to benzodiazepines, sleep medications, narcotic pain medications, etc., until you have been cleared to do so by a medical doctor.   Do not take more than prescribed medications.   Wear a seat belt while driving.  If you have smoked or chewed Tobacco in the last 2 years please stop smoking; also stop any regular Alcohol and/or any Recreational drug use including marijuana.  If you experience worsening of your admission symptoms or develop shortness of breath, chest pain, suicidal or homicidal thoughts or experience a life threatening emergency, you must seek medical attention immediately by calling 911 or calling your PCP immediately.   Increase activity slowly   Complete by: As directed      Allergies as of 12/24/2019      Reactions   Atenolol Other (See Comments)   weakness   Chlorhexidine    Codeine Nausea And Vomiting   Hydrocodone is ok   Lactose Intolerance (gi) Diarrhea   Lodine [etodolac] Other (See Comments)   dizziness   Sulfa Drugs Cross Reactors Nausea Only   Sulfamethoxazole Nausea And Vomiting   Benzonatate Nausea And Vomiting   Escitalopram Oxalate Other (See Comments)   mild hallucinations   Metformin Nausea And Vomiting   Reaction to IR and XL  Oxycodone-acetaminophen Other (See Comments)   Percocet does not relieve pts' pain   Pregabalin Other (See Comments)   Unknown reaction      Medication List    TAKE these medications   acetaminophen 500 MG tablet Commonly known as: TYLENOL Take 500 mg by mouth See admin instructions. Take one tablet (500 mg) by mouth twice daily, may also take one tablet (500 mg)  mid-day as needed for pain/headache   allopurinol 100 MG tablet Commonly known as: ZYLOPRIM Take 0.5 tablets (50 mg total) by mouth daily.   amLODipine 5 MG tablet Commonly known as: NORVASC TAKE ONE TABLET BY MOUTH DAILY What changed: when to take this   aspirin EC 81 MG tablet Take 81 mg by mouth every morning.   atorvastatin 40 MG tablet Commonly known as: LIPITOR Take 40 mg by mouth every morning.   ergocalciferol 1.25 MG (50000 UT) capsule Commonly known as: VITAMIN D2 Take 50,000 Units by mouth every Friday.   feeding supplement (GLUCERNA SHAKE) Liqd Take 237 mLs by mouth 2 (two) times daily between meals. What changed:   when to take this  reasons to take this   fluticasone 50 MCG/ACT nasal spray Commonly known as: FLONASE Place 2 sprays into both nostrils daily. What changed:   when to take this  reasons to take this   furosemide 80 MG tablet Commonly known as: LASIX TAKE 1 TABLET BY MOUTH EVERY DAY   gabapentin 100 MG capsule Commonly known as: NEURONTIN Take 1 capsule (100 mg total) by mouth 3 (three) times daily. What changed:   medication strength  how much to take  how to take this  when to take this  additional instructions   glipiZIDE 5 MG 24 hr tablet Commonly known as: GLUCOTROL XL Take 5 mg by mouth daily.   hydrALAZINE 25 MG tablet Commonly known as: APRESOLINE TAKE 1 TABLET BY MOUTH TWICE A DAY   insulin aspart 100 UNIT/ML injection Commonly known as: novoLOG Inject 3-8 Units into the skin 3 (three) times daily with meals. Based on a sliding scale.   insulin glargine 100 UNIT/ML injection Commonly known as: Lantus Inject 0.25 mLs (25 Units total) into the skin at bedtime. What changed: how much to take   LACTASE PO Take 1 tablet by mouth 3 (three) times daily as needed (if meal includes cheese or other dairy).   metoprolol tartrate 25 MG tablet Commonly known as: LOPRESSOR Take 25 mg by mouth 2 (two) times daily.    multivitamin with minerals Tabs tablet Take 1 tablet by mouth daily.   ONE TOUCH ULTRA TEST test strip Generic drug: glucose blood 1 each by Other route as needed for other (Insulin). Use as directed   polyethylene glycol 17 g packet Commonly known as: MIRALAX / GLYCOLAX Take 17 g by mouth 2 (two) times daily.   predniSONE 20 MG tablet Commonly known as: DELTASONE Take 1 tablet (20 mg total) by mouth daily with breakfast for 2 days, THEN 0.5 tablets (10 mg total) daily with breakfast for 2 days. Start taking on: December 24, 2019   Systane 0.4-0.3 % Soln Generic drug: Polyethyl Glycol-Propyl Glycol Apply 2 drops to eye daily as needed (dry eyes).            Durable Medical Equipment  (From admission, onward)         Start     Ordered   12/24/19 1216  DME Oxygen  Once    Question Answer Comment  Length  of Need 6 Months   Mode or (Route) Nasal cannula   Liters per Minute 1   Frequency Continuous (stationary and portable oxygen unit needed)   Oxygen delivery system Gas      12/24/19 1219         Follow-up Information    [facility] Follow up.   Why: home oxygen Contact information: [address] [place] [address] [phone]          Allergies  Allergen Reactions  . Atenolol Other (See Comments)    weakness  . Chlorhexidine   . Codeine Nausea And Vomiting    Hydrocodone is ok  . Lactose Intolerance (Gi) Diarrhea  . Lodine [Etodolac] Other (See Comments)    dizziness  . Sulfa Drugs Cross Reactors Nausea Only  . Sulfamethoxazole Nausea And Vomiting  . Benzonatate Nausea And Vomiting  . Escitalopram Oxalate Other (See Comments)    mild hallucinations  . Metformin Nausea And Vomiting    Reaction to IR and XL  . Oxycodone-Acetaminophen Other (See Comments)    Percocet does not relieve pts' pain  . Pregabalin Other (See Comments)    Unknown reaction    Time coordinating discharge: Over 30 minutes   SIGNED:   Harold Hedge,  D.O. [facility] Pager: [phone]  12/24/2019, 6:09 PM

## 2019-12-24 NOTE — Progress Notes (Signed)
Pt is 95% on room air

## 2019-12-24 NOTE — Progress Notes (Signed)
Notified MD son is now at bedside

## 2019-12-24 NOTE — Progress Notes (Signed)
Pt remains in chair with legs elevated at this time per her request.  Call bell within reach. Pt has purewick.  Pt reports being comfortable

## 2019-12-24 NOTE — Progress Notes (Signed)
Pt stated her son would be here for discharge home around 4pm

## 2019-12-24 NOTE — Progress Notes (Signed)
Pt's sats dropped to 85% when she sat up in bed.  Pt recovered to 93% after about 1 minute of sitting still.  Pt was walked in her room using her walker on room air.  Pt dropped to 87%.  Pt sat in chair per her request and recovered to 92% within a minute.  Pt is visibly short of breath with any movement. Pt is now sitting comfortably in reclining chair, call bell within reach.

## 2019-12-24 NOTE — Progress Notes (Signed)
SATURATION QUALIFICATIONS: (This note is used to comply with regulatory documentation for home oxygen)  Patient Saturations on Room Air at Rest =95%   Patient Saturations on Room Air while Ambulating = 87%  Patient Saturations on 1 Liter of oxygen while Ambulating = 92%  Please briefly explain why patient needs home oxygen: pt becomes dyspneic with any movement and oxygenation drops below 90%

## 2019-12-25 NOTE — Discharge Summary (Signed)
Physician Discharge Summary  [name] [id number] DOB: [birth year]  PCP: Hoyt Koch, MD  Admit date: 12/10/2019  Discharge date: 12/24/2019  Length of Stay: 13 days  Code Status: DNR  Admitted From: Home  Discharged to: [place]: None  Equipment/Devices: O2 on ambulation  Discharge Condition: Stable  Recommendations for Outpatient Follow-up  1. Follow up with PCP in 1 week 2. Follow up BMP/CBC/uric acid 3. Follow-up right knee pain, follow-up sugars after being on prednisone 4. Lantus was increased to 25 units while on prednisone, can likely go back down to 20 units nightly 5. Reevaluate O2 need Hospital Summary  [name] is a 86 y.o. female with a history of recurrent, CKD 3, type 2 diabetes, hypertension, COPD who had increased shortness of breath and bilateral lower extremity edema associated with cough and hypoxia, SPO2 70s on presentation. Admitted on 2/5 with acute hypoxic respiratory failure secondary to CHF exacerbation. Treated with diuresis/supportive care. 2/11 patient complaining of severe bilateral leg/foot pain thought secondary to peripheral neuropathy. Ultrasound of lower extremities negative for DVT, home gabapentin was initially increased but was cut back due to lethargy and increasing creatinine level. Continued to have neuropathic symptoms. Found to have elevated uric acid level has been started on treatment for gout with steroids, discharged on p.o. also Treated for E. coli UTI with 3 days ceftriaxone as well.  She had renally dosed gabapentin reinstituted with significant improvement in neuropathic symptoms which helped improve her ambulation as well. Patient required liters per minute nasal cannula O2 on ambulation and tolerated room air at rest. She was discharged with incentive spirometry, O2, prednisone, allopurinol, increased Lantus while on steroids and advised to follow-up with lab work with PCP in the next week. Family updated at bedside  prior to discharge.  A & P  Principal Problem:  Acute respiratory failure with hypoxia (HCC)  Active Problems:  Acute on chronic diastolic CHF (congestive heart failure) (HCC)  Diabetic polyneuropathy associated with type 2 diabetes mellitus (HCC)  Benign hypertension with CKD (chronic kidney disease) stage III  Acute lower UTI   1. Acute hypoxic respiratory failure secondary to HFpEF exacerbation 1. Has improved significantly with diuresis 2. Continue Lasix 80 mg daily 3. Discharged with O2 1 L/min nasal cannula 2. Neuropathic foot pain bilaterally 1. Significant improvement with renally dosed gabapentin  2. I suspect this is, in part, leading to her ambulatory dysfunction and should continue to improve with improved neuropathic pain 3. Discharged on gabapentin 100 mg 3 times daily 3. Right knee pain concern for gout or pseudogout improving 1. Ortho was initially consulted however joint was not aspirated 2. Uric acid level elevated at 10.3 3. Significant improvement with steroids, currently day 8 on p.o. prednisone taper, continue taper throughout the weekend and follow-up with PCP 4. started on and discharged on allopurinol 4. AKI on CKD 3B with metabolic alkalosis, likely due to cardiorenal and from hemodynamic changes 1. Baseline creatinine 1.2-1.4, currently 1.33 5. Diabetes 1. HA1C 6.7 2. Continues to have steroid-induced hyperglycemia 3. Discharged on steroid taper and Lantus 25 units nightly, continue home sliding scale and follow-up with Endo 6. Elevated LFTs, unknown etiology possibly from HFpEF exacerbation 1. resolved, follow-up outpatient 7. Acute metabolic encephalopathy, likely medication and hypoxia induced, resolved 8. Pressure injury present on admission 1. Outpatient follow-up Consultants  Ortho Procedures  None Antibiotics               Anti-infectives (From admission, onward)  Start   Dose/Rate Route Frequency Ordered Stop   12/11/19 0145   cefTRIAXone (ROCEPHIN) 1 g in sodium chloride 0.9 % 100 mL IVPB   1 g  200 mL/hr over 30 Minutes Intravenous Daily 12/11/19 0137 12/13/19 2106      Subjective  Patient seen and examined at bedside no acute distress and resting comfortably. No events overnight. Tolerating diet. In good spirits and anticipating discharge.  Denies any chest pain, shortness of breath at rest, fever, nausea, vomiting, urinary or bowel complaints. Otherwise ROS negative  Objective  Discharge Exam:      Vitals:   12/24/19 0537 12/24/19 1000  BP:  (!) 155/59  Pulse:  (!) 55  Resp:  20  Temp:  97.8 F (36.6 C)  SpO2: 94% 91%         Vitals:   12/24/19 0400 12/24/19 0500 12/24/19 0537 12/24/19 1000  BP: (!) 139/56   (!) 155/59  Pulse: (!) 55   (!) 55  Resp: 20   20  Temp: 97.9 F (36.6 C)   97.8 F (36.6 C)  TempSrc: Oral   Oral  SpO2: 91%  94% 91%  Weight:  87.6 kg    Height:       Physical Exam  Vitals and nursing note reviewed.  Constitutional:  Appearance: Normal appearance.  HENT:  Head: Normocephalic and atraumatic.  Eyes:  Conjunctiva/sclera: Conjunctivae normal.  Cardiovascular:  Rate and Rhythm: Normal rate and regular rhythm.  Pulmonary:  Effort: Pulmonary effort is normal.  Breath sounds: Normal breath sounds.  Comments: -Orthopnea Abdominal:  General: Abdomen is flat.  Palpations: Abdomen is soft.  Musculoskeletal:  General: No swelling or tenderness.  Skin:  Coloration: Skin is not jaundiced or pale.  Neurological:  Mental Status: She is alert. Mental status is at baseline.  Psychiatric:  Mood and Affect: Mood normal.  Behavior: Behavior normal.   The results of significant diagnostics from this hospitalization (including imaging, microbiology, ancillary and laboratory) are listed below for reference.   Microbiology:         Recent Results (from the past 240 hour(s))  Culture, Urine Status: None   Collection Time: 12/17/19 7:00 PM   Specimen: Urine, Random  Result  Value Ref Range Status   Specimen Description URINE, RANDOM  Final   Special Requests NONE  Final   Culture   Final    NO GROWTH  Performed at [facility], [address], [place], [place] [address]    Report Status 12/19/2019 FINAL  Final   Labs:  BNP (last 3 results)  Recent Labs (within last 365 days)       Recent Labs    09/20/19  1347 12/10/19  2140   BNP 1,277.5* 0000000*   Basic Metabolic Panel:  Last Labs          Recent Labs  Lab 12/20/19  0748 12/21/19  0157 12/22/19  1024 12/23/19  0300 12/24/19  0328  NA 136 132* 138 134* 139  K 5.0 5.1 4.6 4.9 4.6  CL 88* 88* 94* 91* 94*  CO2 35* 35* 34* 33* 35*  GLUCOSE 227* 324* 287* 407* 268*  BUN 81* 85* 69* 64* 47*  CREATININE 1.60* 1.68* 1.40* 1.48* 1.33*  CALCIUM 9.1 8.9 8.7* 8.8* 8.9  MG --  --  --  2.6* 2.5*  Liver Function Tests:  Last Labs          Recent Labs  Lab 12/18/19  0723 12/19/19  0330  12/20/19  0748 12/22/19  1047 12/23/19  0300  AST 25 68* 111* 39 26  ALT 24 58* 139* 99* 83*  ALKPHOS 49 48 48 41 43  BILITOT 1.0 0.7 0.6 0.5 0.9  PROT 5.5* 5.4* 5.4* 5.2* 5.2*  ALBUMIN 2.3* 2.3* 2.3* 2.5* 2.5*   Last Labs   No results for input(s): LIPASE, AMYLASE in the last 168 hours.   Last Labs   No results for input(s): AMMONIA in the last 168 hours.  CBC:  Last Labs            Recent Labs  Lab 12/18/19  0723 12/19/19  0330 12/20/19  0748 12/21/19  0157 12/22/19  1024 12/23/19  0300 12/24/19  0328  WBC 14.3* < > 10.6* 8.4 8.7 9.0 11.7*  NEUTROABS 13.6* --  --  --  --  --  --   HGB 10.9* < > 10.7* 10.8* 11.4* 11.1* 11.2*  HCT 36.1 < > 34.7* 35.6* 37.8 37.1 37.0  MCV 88.3 < > 87.0 87.7 89.2 88.5 88.9  PLT 245 < > 305 305 341 342 350  < > = values in this interval not displayed.  Cardiac Enzymes:  Last Labs   No results for input(s): CKTOTAL, CKMB, CKMBINDEX, TROPONINI in the last 168 hours.  BNP:  Last Labs   Invalid input(s): POCBNP  CBG:  Last Labs          Recent Labs   Lab 12/23/19  1653 12/23/19  2238 12/24/19  0755 12/24/19  1152 12/24/19  1631  GLUCAP 220* 235* 227* 168* 254*  D-Dimer  Recent Labs (last 2 labs)   No results for input(s): DDIMER in the last 72 hours.  Hgb A1c  Recent Labs (last 2 labs)   No results for input(s): HGBA1C in the last 72 hours.  Lipid Profile  Recent Labs (last 2 labs)   No results for input(s): CHOL, HDL, LDLCALC, TRIG, CHOLHDL, LDLDIRECT in the last 72 hours.  Thyroid function studies  Recent Labs (last 2 labs)   No results for input(s): TSH, T4TOTAL, T3FREE, THYROIDAB in the last 72 hours.  Invalid input(s): FREET3  Anemia work up  [employer] (last 2 labs)   No results for input(s): VITAMINB12, FOLATE, FERRITIN, TIBC, IRON, RETICCTPCT in the last 72 hours.  Urinalysis  Labs (Brief)         Component Value Date/Time   COLORURINE YELLOW 12/17/2019 1900   APPEARANCEUR CLEAR 12/17/2019 1900   LABSPEC 1.012 12/17/2019 1900   PHURINE 5.0 12/17/2019 1900   GLUCOSEU NEGATIVE 12/17/2019 1900   HGBUR NEGATIVE 12/17/2019 1900   BILIRUBINUR NEGATIVE 12/17/2019 1900   BILIRUBINUR Neg 08/06/2018 1540   KETONESUR NEGATIVE 12/17/2019 1900   PROTEINUR NEGATIVE 12/17/2019 1900   UROBILINOGEN 0.2 08/06/2018 1540   UROBILINOGEN Normal 07/18/2017 0000   NITRITE NEGATIVE 12/17/2019 1900   LEUKOCYTESUR NEGATIVE 12/17/2019 1900  Sepsis Labs  Last Labs   Invalid input(s): PROCALCITONIN, WBC, LACTICIDVEN  Microbiology         Recent Results (from the past 240 hour(s))  Culture, Urine Status: None   Collection Time: 12/17/19 7:00 PM   Specimen: Urine, Random  Result Value Ref Range Status   Specimen Description URINE, RANDOM  Final   Special Requests NONE  Final   Culture   Final    NO GROWTH  Performed at [facility], [place] [address] [place], [place] [address]    Report Status 12/19/2019 FINAL  Final   Discharge  Instructions       Discharge Instructions     Diet - low sodium heart healthy  Complete  by: As directed    Discharge instructions  Complete by: As directed    You were seen and examined in the hospital for heart failure exacerbation and suspected gout and cared for by a hospitalist.  Upon Discharge:  - change how you take Gabapentin. Now take 100 mg three times daily  - Take allopurinol daily  - Take Prednisone 20 mg once daily for two days then 10 mg for two days, then you can stop. If you have worsening knee pain as this steroid is tapered down, call your primary care doctor as you may need to go back to increased steroid doses  - Increase your Lantus to 25 units nightly while on prednisone and continue taking your sliding scale insulin and glipizide. You can likely go back to your normal Lantus 20 u nightly once your prednisone is done. Monitor your sugar carefully on this increased dose of insulin and other diabetic medications as this can cause your blood sugar to be low. If you are having shaking, chills, nausea and your blood sugar is less than 80, then have a sugary snack.  - Make an appointment with your primary care physician within 7 days  - Get lab work prior to your follow up appointment with your PCP  Bring all home medications to your appointment to review  Request that your primary physician go over all hospital tests and procedures/radiological results at the follow up.  Please get all hospital records sent to your physician by signing a hospital release before you go home.  Read the complete instructions along with all the possible side effects for all the medicines you take and that have been prescribed to you. Take any new medicines after you have completely understood and accept all the possible adverse reactions/side effects.  If you have any questions about your discharge medications or the care you received while you were in the hospital, you can call the unit and asked to speak with the hospitalist on call. Once you are discharged, your primary care physician will  handle any further medical issues. Please note that NO REFILLS for any discharge medications will be authorized, as it is imperative that you return to your primary care physician (or establish a relationship with a primary care physician if you do not have one) for your aftercare needs so that they can reassess your need for medications and monitor your lab values.  Do not drive, operate heavy machinery, perform activities at heights, swimming or participation in water activities or provide baby sitting services if your were admitted for loss of consciousness/seizures or if you are on sedating medications including, but not limited to benzodiazepines, sleep medications, narcotic pain medications, etc., until you have been cleared to do so by a medical doctor.  Do not take more than prescribed medications.  Wear a seat belt while driving.  If you have smoked or chewed Tobacco in the last 2 years please stop smoking; also stop any regular Alcohol and/or any Recreational drug use including marijuana.  If you experience worsening of your admission symptoms or develop shortness of breath, chest pain, suicidal or homicidal thoughts or experience a life threatening emergency, you must seek medical attention immediately by calling 911 or calling your PCP immediately.   Increase activity slowly  Complete by: As directed  Allergies as of 12/24/2019      Reactions   Atenolol Other (See Comments)   weakness   Chlorhexidine    Codeine Nausea And Vomiting   Hydrocodone is ok   Lactose Intolerance (gi) Diarrhea   Lodine [etodolac] Other (See Comments)   dizziness   Sulfa Drugs Cross Reactors Nausea Only   Sulfamethoxazole Nausea And Vomiting   Benzonatate Nausea And Vomiting   Escitalopram Oxalate Other (See Comments)   mild hallucinations   Metformin Nausea And Vomiting   Reaction to IR and XL   Oxycodone-acetaminophen Other (See Comments)   Percocet does not relieve pts' pain   Pregabalin  Other (See Comments)   Unknown reaction                 Medication List         TAKE these medications        acetaminophen 500 MG tablet  Commonly known as: TYLENOL  Take 500 mg by mouth See admin instructions. Take one tablet (500 mg) by mouth twice daily, may also take one tablet (500 mg) mid-day as needed for pain/headache   allopurinol 100 MG tablet  Commonly known as: ZYLOPRIM  Take 0.5 tablets (50 mg total) by mouth daily.   amLODipine 5 MG tablet  Commonly known as: NORVASC  TAKE ONE TABLET BY MOUTH DAILY  What changed: when to take this   aspirin EC 81 MG tablet  Take 81 mg by mouth every morning.   atorvastatin 40 MG tablet  Commonly known as: LIPITOR  Take 40 mg by mouth every morning.   ergocalciferol 1.25 MG (50000 UT) capsule  Commonly known as: VITAMIN D2  Take 50,000 Units by mouth every Friday.   feeding supplement (GLUCERNA SHAKE) Liqd  Take 237 mLs by mouth 2 (two) times daily between meals.  What changed:  when to take this  reasons to take this  fluticasone 50 MCG/ACT nasal spray  Commonly known as: FLONASE  Place 2 sprays into both nostrils daily.  What changed:  when to take this  reasons to take this  furosemide 80 MG tablet  Commonly known as: LASIX  TAKE 1 TABLET BY MOUTH EVERY DAY   gabapentin 100 MG capsule  Commonly known as: NEURONTIN  Take 1 capsule (100 mg total) by mouth 3 (three) times daily.  What changed:  medication strength  how much to take  how to take this  when to take this  additional instructions  glipiZIDE 5 MG 24 hr tablet  Commonly known as: GLUCOTROL XL  Take 5 mg by mouth daily.   hydrALAZINE 25 MG tablet  Commonly known as: APRESOLINE  TAKE 1 TABLET BY MOUTH TWICE A DAY   insulin aspart 100 UNIT/ML injection  Commonly known as: novoLOG  Inject 3-8 Units into the skin 3 (three) times daily with meals. Based on a sliding scale.   insulin glargine 100 UNIT/ML injection  Commonly known as: Lantus  Inject  0.25 mLs (25 Units total) into the skin at bedtime.  What changed: how much to take   LACTASE PO  Take 1 tablet by mouth 3 (three) times daily as needed (if meal includes cheese or other dairy).   metoprolol tartrate 25 MG tablet  Commonly known as: LOPRESSOR  Take 25 mg by mouth 2 (two) times daily.   multivitamin with minerals Tabs tablet  Take 1 tablet by mouth daily.   ONE TOUCH ULTRA TEST test strip  Generic drug: glucose blood  1 each by Other route as needed for other (Insulin). Use as directed   polyethylene glycol 17 g packet  Commonly known as: MIRALAX / GLYCOLAX  Take 17 g by mouth 2 (two) times daily.   predniSONE 20 MG tablet  Commonly known as: DELTASONE  Take 1 tablet (20 mg total) by mouth daily with breakfast for 2 days, THEN 0.5 tablets (10 mg total) daily with breakfast for 2 days.  Start taking on: December 24, 2019   Systane 0.4-0.3 % Soln  Generic drug: Polyethyl Glycol-Propyl Glycol  Apply 2 drops to eye daily as needed (dry eyes).                          Durable Medical Equipment   (From admission, onward)                          Start   Ordered      12/24/19 1216  DME Oxygen Once  12/24/19 1219        Question Answer Comment         Length of Need 6 Months          Mode or (Route) Nasal cannula          Liters per Minute 1          Frequency Continuous (stationary and portable oxygen unit needed)          Oxygen delivery system Gas                  Follow-up Information     [facility] Follow up.    Why: home oxygen  Contact information:  [address]  [place] [address]  [phone]               Allergies  Allergen Reactions  . Atenolol Other (See Comments)    weakness  . Chlorhexidine   . Codeine Nausea And Vomiting    Hydrocodone is ok  . Lactose Intolerance (Gi) Diarrhea  . Lodine [Etodolac] Other (See Comments)    dizziness  . Sulfa Drugs Cross Reactors Nausea Only  . Sulfamethoxazole Nausea  And Vomiting  . Benzonatate Nausea And Vomiting  . Escitalopram Oxalate Other (See Comments)    mild hallucinations  . Metformin Nausea And Vomiting    Reaction to IR and XL  . Oxycodone-Acetaminophen Other (See Comments)    Percocet does not relieve pts' pain  . Pregabalin Other (See Comments)    Unknown reaction   Time coordinating discharge: Over 30 minutes  SIGNED:  Harold Hedge, D.O.  [facility]  Pager: [phone]  12/24/2019, 6:09 PM

## 2019-12-27 ENCOUNTER — Other Ambulatory Visit: Payer: Self-pay

## 2019-12-27 NOTE — Consult Note (Signed)
Post hospital follow up:  Son declines home health follow up, patient defers to [name]. High risk for unplanned readmission, went home, admitted with Resp Failure. HX of COPD Assign for COPD EMMI calls.  PCP is Dr. Sharlet [place], office does TOC follow up.  Natividad Brood, RN BSN [facility] Liaison  [phone] business mobile phone Toll free office [phone]  Fax number: [phone] [email or web address]

## 2019-12-30 ENCOUNTER — Ambulatory Visit (INDEPENDENT_AMBULATORY_CARE_PROVIDER_SITE_OTHER): Payer: Medicare Other | Admitting: Internal Medicine

## 2019-12-30 ENCOUNTER — Encounter: Payer: Self-pay | Admitting: Internal Medicine

## 2019-12-30 ENCOUNTER — Other Ambulatory Visit: Payer: Self-pay

## 2019-12-30 VITALS — BP 124/62 | HR 64 | Temp 97.9°F | Ht 64.0 in

## 2019-12-30 DIAGNOSIS — N179 Acute kidney failure, unspecified: Secondary | ICD-10-CM

## 2019-12-30 DIAGNOSIS — N189 Chronic kidney disease, unspecified: Secondary | ICD-10-CM | POA: Diagnosis not present

## 2019-12-30 DIAGNOSIS — N390 Urinary tract infection, site not specified: Secondary | ICD-10-CM

## 2019-12-30 DIAGNOSIS — I5032 Chronic diastolic (congestive) heart failure: Secondary | ICD-10-CM | POA: Diagnosis not present

## 2019-12-30 DIAGNOSIS — E1142 Type 2 diabetes mellitus with diabetic polyneuropathy: Secondary | ICD-10-CM

## 2019-12-30 LAB — COMPREHENSIVE METABOLIC PANEL
ALT: 48 U/L — ABNORMAL HIGH (ref 0–35)
AST: 20 U/L (ref 0–37)
Albumin: 3.2 g/dL — ABNORMAL LOW (ref 3.5–5.2)
Alkaline Phosphatase: 59 U/L (ref 39–117)
BUN: 38 mg/dL — ABNORMAL HIGH (ref 6–23)
CO2: 31 mEq/L (ref 19–32)
Calcium: 8.7 mg/dL (ref 8.4–10.5)
Chloride: 103 mEq/L (ref 96–112)
Creatinine, Ser: 1.26 mg/dL — ABNORMAL HIGH (ref 0.40–1.20)
GFR: 40.22 mL/min — ABNORMAL LOW (ref >60.00)
Glucose, Bld: 243 mg/dL — ABNORMAL HIGH (ref 70–99)
Potassium: 4.1 mEq/L (ref 3.5–5.1)
Sodium: 139 mEq/L (ref 135–145)
Total Bilirubin: 0.3 mg/dL (ref 0.2–1.2)
Total Protein: 5.6 g/dL — ABNORMAL LOW (ref 6.0–8.3)

## 2019-12-30 LAB — CBC
HCT: 35.9 % — ABNORMAL LOW (ref 36.0–46.0)
Hemoglobin: 11.1 g/dL — ABNORMAL LOW (ref 12.0–15.0)
MCHC: 30.9 g/dL (ref 30.0–36.0)
MCV: 85.9 fl (ref 78.0–100.0)
Platelets: 286 10*3/uL (ref 150.0–400.0)
RBC: 4.18 Mil/uL (ref 3.87–5.11)
RDW: 16 % — ABNORMAL HIGH (ref 11.5–15.5)
WBC: 12.4 10*3/uL — ABNORMAL HIGH (ref 4.0–10.5)

## 2019-12-30 NOTE — Progress Notes (Signed)
   Subjective:   Patient ID: [name], female    DOB: 07/10/1931, 86 y.o.   MRN: [id number]  HPI The patient is an 86 YO female coming in for hospital follow up  (in for anasarca and HF exacerbation, then got gout versus pseudogout and treated with steroids, discharged with home PT). She is doing okay since being home. No falls. Appetite is okay. Denies worsening breathing. Does have some swelling in her legs which is usual for her. Denies chest pains or abdominal pain. Denies diarrhea or constipation.  PMH, [facility], social history reviewed and updated  Review of Systems  Constitutional: Positive for activity change and appetite change.  HENT: Negative.   Eyes: Negative.   Respiratory: Negative for cough, chest tightness and shortness of breath.   Cardiovascular: Negative for chest pain, palpitations and leg swelling.  Gastrointestinal: Negative for abdominal distention, abdominal pain, constipation, diarrhea, nausea and vomiting.  Musculoskeletal: Negative.   Skin: Negative.   Neurological: Negative.   Psychiatric/Behavioral: Negative.     Objective:  Physical Exam Constitutional:      Appearance: She is well-developed. She is obese.  HENT:     Head: Normocephalic and atraumatic.  Cardiovascular:     Rate and Rhythm: Normal rate and regular rhythm.  Pulmonary:     Effort: Pulmonary effort is normal. No respiratory distress.     Breath sounds: Normal breath sounds. No wheezing or rales.  Abdominal:     General: Bowel sounds are normal. There is no distension.     Palpations: Abdomen is soft.     Tenderness: There is no abdominal tenderness. There is no rebound.  Musculoskeletal:     Cervical back: Normal range of motion.     Right lower leg: Edema present.     Left lower leg: Edema present.     Comments: 1+ bilateral  Skin:    General: Skin is warm and dry.  Neurological:     Mental Status: She is alert and oriented to person, place, and time.     Coordination:  Coordination normal.     Vitals:   12/30/19 1505  BP: 124/62  Pulse: 64  Temp: 97.9 F (36.6 C)  TempSrc: Oral  SpO2: 95%  Height: 5\' 4"  (1.626 m)    This visit occurred during the SARS-CoV-2 public health emergency.  Safety protocols were in place, including screening questions prior to the visit, additional usage of staff PPE, and extensive cleaning of exam room while observing appropriate contact time as indicated for disinfecting solutions.   Assessment & Plan:

## 2019-12-30 NOTE — Patient Instructions (Signed)
We will check the urine today and the blood work.  We will check the uric acid level in about 1-2 months.

## 2019-12-31 LAB — URINALYSIS, ROUTINE W REFLEX MICROSCOPIC
Bilirubin Urine: NEGATIVE
Hgb urine dipstick: NEGATIVE
Ketones, ur: NEGATIVE
Nitrite: NEGATIVE
RBC / HPF: NONE SEEN (ref 0–?)
Specific Gravity, Urine: 1.01 (ref 1.000–1.030)
Total Protein, Urine: NEGATIVE
Urine Glucose: NEGATIVE
Urobilinogen, UA: 0.2 (ref 0.0–1.0)
pH: 6 (ref 5.0–8.0)

## 2019-12-31 NOTE — Assessment & Plan Note (Signed)
Checking U/A

## 2019-12-31 NOTE — Assessment & Plan Note (Signed)
Gabapentin 100 mg TID prn is working well for her pain.

## 2019-12-31 NOTE — Assessment & Plan Note (Signed)
Checking CMP and adjust as needed.  

## 2019-12-31 NOTE — Assessment & Plan Note (Signed)
No signs of flare today.

## 2020-01-14 ENCOUNTER — Other Ambulatory Visit: Payer: Self-pay | Admitting: Cardiovascular Disease

## 2020-01-16 ENCOUNTER — Encounter: Payer: Self-pay | Admitting: Internal Medicine

## 2020-01-18 MED ORDER — GABAPENTIN 100 MG PO CAPS: 100.0000 mg | ORAL_CAPSULE | Freq: Three times a day (TID) | ORAL | 6 refills | Status: DC

## 2020-02-15 ENCOUNTER — Encounter: Payer: Self-pay | Admitting: Internal Medicine

## 2020-02-15 DIAGNOSIS — M1A9XX0 Chronic gout, unspecified, without tophus (tophi): Secondary | ICD-10-CM

## 2020-02-15 NOTE — Telephone Encounter (Signed)
Pls advise if ok to refill../lmb 

## 2020-02-15 NOTE — Telephone Encounter (Signed)
She should come to lab for uric acid so we can decide if dose adjustment is needed prior to running out.

## 2020-02-17 NOTE — Addendum Note (Signed)
Addended by: Pricilla Holm A on: 02/17/2020 04:05 PM   Modules accepted: Orders

## 2020-02-18 ENCOUNTER — Other Ambulatory Visit (INDEPENDENT_AMBULATORY_CARE_PROVIDER_SITE_OTHER): Payer: Medicare Other

## 2020-02-18 DIAGNOSIS — M1A9XX0 Chronic gout, unspecified, without tophus (tophi): Secondary | ICD-10-CM

## 2020-02-18 LAB — URINALYSIS, ROUTINE W REFLEX MICROSCOPIC
Bilirubin Urine: NEGATIVE
Hgb urine dipstick: NEGATIVE
Ketones, ur: NEGATIVE
Nitrite: POSITIVE — AB
RBC / HPF: NONE SEEN (ref 0–?)
Specific Gravity, Urine: 1.015 (ref 1.000–1.030)
Total Protein, Urine: NEGATIVE
Urine Glucose: NEGATIVE
Urobilinogen, UA: 0.2 (ref 0.0–1.0)
pH: 5.5 (ref 5.0–8.0)

## 2020-02-18 LAB — URIC ACID: Uric Acid, Serum: 8.3 mg/dL — ABNORMAL HIGH (ref 2.4–7.0)

## 2020-02-21 ENCOUNTER — Other Ambulatory Visit: Payer: Self-pay | Admitting: Internal Medicine

## 2020-02-21 MED ORDER — ALLOPURINOL 100 MG PO TABS: 100.0000 mg | ORAL_TABLET | Freq: Every day | ORAL | 1 refills | Status: DC

## 2020-02-21 MED ORDER — NITROFURANTOIN MONOHYD MACRO 100 MG PO CAPS: 100.0000 mg | ORAL_CAPSULE | Freq: Two times a day (BID) | ORAL | 0 refills | Status: DC

## 2020-02-29 DIAGNOSIS — D631 Anemia in chronic kidney disease: Secondary | ICD-10-CM | POA: Diagnosis not present

## 2020-02-29 DIAGNOSIS — N182 Chronic kidney disease, stage 2 (mild): Secondary | ICD-10-CM | POA: Diagnosis not present

## 2020-02-29 DIAGNOSIS — N1832 Chronic kidney disease, stage 3b: Secondary | ICD-10-CM | POA: Diagnosis not present

## 2020-02-29 DIAGNOSIS — Z794 Long term (current) use of insulin: Secondary | ICD-10-CM | POA: Diagnosis not present

## 2020-02-29 DIAGNOSIS — E782 Mixed hyperlipidemia: Secondary | ICD-10-CM | POA: Diagnosis not present

## 2020-02-29 DIAGNOSIS — E1165 Type 2 diabetes mellitus with hyperglycemia: Secondary | ICD-10-CM | POA: Diagnosis not present

## 2020-03-02 DIAGNOSIS — E1142 Type 2 diabetes mellitus with diabetic polyneuropathy: Secondary | ICD-10-CM | POA: Diagnosis not present

## 2020-03-02 DIAGNOSIS — I130 Hypertensive heart and chronic kidney disease with heart failure and stage 1 through stage 4 chronic kidney disease, or unspecified chronic kidney disease: Secondary | ICD-10-CM | POA: Diagnosis not present

## 2020-03-02 DIAGNOSIS — E1122 Type 2 diabetes mellitus with diabetic chronic kidney disease: Secondary | ICD-10-CM | POA: Diagnosis not present

## 2020-03-02 DIAGNOSIS — E113393 Type 2 diabetes mellitus with moderate nonproliferative diabetic retinopathy without macular edema, bilateral: Secondary | ICD-10-CM | POA: Diagnosis not present

## 2020-03-02 DIAGNOSIS — E782 Mixed hyperlipidemia: Secondary | ICD-10-CM | POA: Diagnosis not present

## 2020-03-02 DIAGNOSIS — E1165 Type 2 diabetes mellitus with hyperglycemia: Secondary | ICD-10-CM | POA: Diagnosis not present

## 2020-03-02 DIAGNOSIS — Z794 Long term (current) use of insulin: Secondary | ICD-10-CM | POA: Diagnosis not present

## 2020-03-02 DIAGNOSIS — E559 Vitamin D deficiency, unspecified: Secondary | ICD-10-CM | POA: Diagnosis not present

## 2020-03-02 DIAGNOSIS — I5032 Chronic diastolic (congestive) heart failure: Secondary | ICD-10-CM | POA: Diagnosis not present

## 2020-03-02 DIAGNOSIS — D631 Anemia in chronic kidney disease: Secondary | ICD-10-CM | POA: Diagnosis not present

## 2020-03-02 DIAGNOSIS — N1832 Chronic kidney disease, stage 3b: Secondary | ICD-10-CM | POA: Diagnosis not present

## 2020-03-06 ENCOUNTER — Other Ambulatory Visit: Payer: Self-pay | Admitting: Cardiovascular Disease

## 2020-03-30 ENCOUNTER — Ambulatory Visit (INDEPENDENT_AMBULATORY_CARE_PROVIDER_SITE_OTHER): Payer: Medicare Other | Admitting: Internal Medicine

## 2020-03-30 ENCOUNTER — Encounter: Payer: Self-pay | Admitting: Internal Medicine

## 2020-03-30 ENCOUNTER — Other Ambulatory Visit: Payer: Self-pay

## 2020-03-30 VITALS — BP 140/64 | HR 64 | Temp 98.1°F | Ht 64.0 in | Wt 196.4 lb

## 2020-03-30 DIAGNOSIS — E1142 Type 2 diabetes mellitus with diabetic polyneuropathy: Secondary | ICD-10-CM

## 2020-03-30 DIAGNOSIS — I5032 Chronic diastolic (congestive) heart failure: Secondary | ICD-10-CM | POA: Diagnosis not present

## 2020-03-30 DIAGNOSIS — N390 Urinary tract infection, site not specified: Secondary | ICD-10-CM | POA: Diagnosis not present

## 2020-03-30 DIAGNOSIS — N1832 Chronic kidney disease, stage 3b: Secondary | ICD-10-CM

## 2020-03-30 DIAGNOSIS — Z794 Long term (current) use of insulin: Secondary | ICD-10-CM

## 2020-03-30 DIAGNOSIS — M1A30X0 Chronic gout due to renal impairment, unspecified site, without tophus (tophi): Secondary | ICD-10-CM

## 2020-03-30 LAB — POC URINALSYSI DIPSTICK (AUTOMATED)
Bilirubin, UA: NEGATIVE
Blood, UA: NEGATIVE
Glucose, UA: NEGATIVE
Ketones, UA: NEGATIVE
Leukocytes, UA: NEGATIVE
Nitrite, UA: NEGATIVE
Protein, UA: NEGATIVE
Spec Grav, UA: 1.015 (ref 1.010–1.025)
Urobilinogen, UA: 0.2 E.U./dL
pH, UA: 6 (ref 5.0–8.0)

## 2020-03-30 NOTE — Progress Notes (Signed)
   Subjective:   Patient ID: [name], female    DOB: August 31, 1931, 86 y.o.   MRN: [id number]  HPI The patient is an 86 YO female coming in for concerns about UTI (had infection back in April and is not sure it cleared, denies pain in stomach, denies nausea or vomiting, some pressure with urinating) and wheezing at night time (does not feel like fluid overload, she is urinating well and no change to diet, she feels mostly from being sedentary she gets a little winded with moving around, this clears within a minute once she is settled or stops moving) and gout (she has increased allopurinol to 1 pill daily about 1-2 months ago, no flare but wants to make sure levels are doing better, denies side effects of medication).   Review of Systems  Constitutional: Positive for activity change.  HENT: Negative.   Eyes: Negative.   Respiratory: Positive for shortness of breath. Negative for cough and chest tightness.   Cardiovascular: Negative for chest pain, palpitations and leg swelling.  Gastrointestinal: Negative for abdominal distention, abdominal pain, constipation, diarrhea, nausea and vomiting.  Musculoskeletal: Negative.   Skin: Negative.   Neurological: Negative.   Psychiatric/Behavioral: Negative.     Objective:  Physical Exam Constitutional:      Appearance: She is well-developed. She is obese.  HENT:     Head: Normocephalic and atraumatic.  Cardiovascular:     Rate and Rhythm: Normal rate and regular rhythm.  Pulmonary:     Effort: Pulmonary effort is normal. No respiratory distress.     Breath sounds: Normal breath sounds. No wheezing or rales.  Abdominal:     General: Bowel sounds are normal. There is no distension.     Palpations: Abdomen is soft.     Tenderness: There is no abdominal tenderness. There is no rebound.  Musculoskeletal:     Cervical back: Normal range of motion.     Comments: 1+ bilateral edema, stable from prior  Skin:    General: Skin is warm and dry.    Neurological:     Mental Status: She is alert and oriented to person, place, and time.     Coordination: Coordination abnormal.     Comments: Walker at home, wheelchair for long distances     Vitals:   03/30/20 1529  BP: 140/64  Pulse: 64  Temp: 98.1 F (36.7 C)  SpO2: 94%  Weight: 196 lb 6.4 oz (89.1 kg)  Height: 5\' 4"  (1.626 m)    This visit occurred during the SARS-CoV-2 public health emergency.  Safety protocols were in place, including screening questions prior to the visit, additional usage of staff PPE, and extensive cleaning of exam room while observing appropriate contact time as indicated for disinfecting solutions.   Assessment & Plan:

## 2020-03-30 NOTE — Patient Instructions (Signed)
There are no signs of infection today in the urine.   We are checking the labs today.

## 2020-03-31 DIAGNOSIS — M109 Gout, unspecified: Secondary | ICD-10-CM | POA: Insufficient documentation

## 2020-03-31 LAB — COMPREHENSIVE METABOLIC PANEL
ALT: 17 U/L (ref 0–35)
AST: 18 U/L (ref 0–37)
Albumin: 4 g/dL (ref 3.5–5.2)
Alkaline Phosphatase: 57 U/L (ref 39–117)
BUN: 37 mg/dL — ABNORMAL HIGH (ref 6–23)
CO2: 33 mEq/L — ABNORMAL HIGH (ref 19–32)
Calcium: 9.6 mg/dL (ref 8.4–10.5)
Chloride: 100 mEq/L (ref 96–112)
Creatinine, Ser: 1.36 mg/dL — ABNORMAL HIGH (ref 0.40–1.20)
GFR: 36.8 mL/min — ABNORMAL LOW (ref >60.00)
Glucose, Bld: 184 mg/dL — ABNORMAL HIGH (ref 70–99)
Potassium: 4.4 mEq/L (ref 3.5–5.1)
Sodium: 140 mEq/L (ref 135–145)
Total Bilirubin: 0.3 mg/dL (ref 0.2–1.2)
Total Protein: 6.3 g/dL (ref 6.0–8.3)

## 2020-03-31 LAB — CBC
HCT: 35.7 % — ABNORMAL LOW (ref 36.0–46.0)
Hemoglobin: 11.5 g/dL — ABNORMAL LOW (ref 12.0–15.0)
MCHC: 32.3 g/dL (ref 30.0–36.0)
MCV: 87.4 fl (ref 78.0–100.0)
Platelets: 239 10*3/uL (ref 150.0–400.0)
RBC: 4.09 Mil/uL (ref 3.87–5.11)
RDW: 17.8 % — ABNORMAL HIGH (ref 11.5–15.5)
WBC: 9 10*3/uL (ref 4.0–10.5)

## 2020-03-31 LAB — URIC ACID: Uric Acid, Serum: 7.1 mg/dL — ABNORMAL HIGH (ref 2.4–7.0)

## 2020-03-31 NOTE — Assessment & Plan Note (Signed)
No flare today, weight stable. Overall symptoms well controlled and encouraged continued physical activity to help with SOB.

## 2020-03-31 NOTE — Assessment & Plan Note (Signed)
Checking uric acid and adjust dosing of allopurinol as needed. Currently taking 100 mg daily.

## 2020-03-31 NOTE — Assessment & Plan Note (Signed)
They are concerned for recurrence. U/A checked in office today not consistent with infection. No antibiotics indicated.

## 2020-03-31 NOTE — Assessment & Plan Note (Signed)
Stable neuropathy and on gabapentin for this. Seeing endo who manages her novolog and lantus. No low sugars recently.

## 2020-03-31 NOTE — Assessment & Plan Note (Signed)
Checking CMP for stability. BP at goal, diabetes managed well.

## 2020-04-09 ENCOUNTER — Other Ambulatory Visit: Payer: Self-pay | Admitting: Cardiovascular Disease

## 2020-05-15 ENCOUNTER — Other Ambulatory Visit: Payer: Medicare Other

## 2020-05-15 ENCOUNTER — Telehealth: Payer: Self-pay

## 2020-05-15 DIAGNOSIS — R300 Dysuria: Secondary | ICD-10-CM

## 2020-05-15 NOTE — Addendum Note (Signed)
Addended by: Pricilla Holm A on: 05/15/2020 01:48 PM   Modules accepted: Orders

## 2020-05-15 NOTE — Telephone Encounter (Signed)
New message   Son [name] calling wanted an order to be entered into the epic system for urinalysis due to UTI.   Son [name] voiced this is a chronic symptom sometimes Dr. Sharlet [name] order the test verse coming into the office.  An Appt was offer today @ 2 pm  Decline wanted a message sent around  Son is asking for the order to be entered as soon as possible so an antibiotic would be called in as soon as possible.   [facility], [place]

## 2020-05-15 NOTE — Telephone Encounter (Signed)
Order placed and can come leave sample.

## 2020-05-15 NOTE — Telephone Encounter (Signed)
Son has been informed and will bring the pt to the office within the hour to give the sample.

## 2020-05-16 ENCOUNTER — Other Ambulatory Visit: Payer: Self-pay | Admitting: Internal Medicine

## 2020-05-16 LAB — URINALYSIS, ROUTINE W REFLEX MICROSCOPIC
Bacteria, UA: NONE SEEN /HPF
Bilirubin Urine: NEGATIVE
Glucose, UA: NEGATIVE
Hgb urine dipstick: NEGATIVE
Hyaline Cast: NONE SEEN /LPF
Ketones, ur: NEGATIVE
Nitrite: NEGATIVE
Protein, ur: NEGATIVE
RBC / HPF: NONE SEEN /HPF (ref 0–2)
Specific Gravity, Urine: 1.008 (ref 1.001–1.03)
Squamous Epithelial / HPF: NONE SEEN /HPF (ref <=5)
pH: 5.5 (ref 5.0–8.0)

## 2020-05-16 MED ORDER — NITROFURANTOIN MONOHYD MACRO 100 MG PO CAPS: 100.0000 mg | ORAL_CAPSULE | Freq: Two times a day (BID) | ORAL | 0 refills | Status: DC

## 2020-05-23 ENCOUNTER — Other Ambulatory Visit: Payer: Self-pay

## 2020-05-23 ENCOUNTER — Encounter: Payer: Self-pay | Admitting: Cardiovascular Disease

## 2020-05-23 ENCOUNTER — Ambulatory Visit (INDEPENDENT_AMBULATORY_CARE_PROVIDER_SITE_OTHER): Payer: Medicare Other | Admitting: Cardiovascular Disease

## 2020-05-23 VITALS — BP 128/62 | HR 82 | Ht 64.0 in | Wt 195.0 lb

## 2020-05-23 DIAGNOSIS — I5032 Chronic diastolic (congestive) heart failure: Secondary | ICD-10-CM | POA: Diagnosis not present

## 2020-05-23 DIAGNOSIS — N1832 Chronic kidney disease, stage 3b: Secondary | ICD-10-CM

## 2020-05-23 DIAGNOSIS — I10 Essential (primary) hypertension: Secondary | ICD-10-CM

## 2020-05-23 NOTE — Progress Notes (Signed)
Cardiology Office Note   Date:  05/23/2020   ID:  [name], DOB 1931-10-07, MRN [id number]  PCP:  Hoyt Koch, MD  Cardiologist: Darlin Coco MD, now [place]  Chief Complaint  Patient presents with  . Congestive Heart Failure  . Hyperlipidemia   Problem list 1. Chronic diastolic congestive heart failure 2. History of pericardial effusion-status post pericardial window 3.  Aortic stenosis 4. Diabetes mellitus 5. Coronary artery disease 6. Essential hypertension 7. Hyperlipidemia   Notes from [place] - Feb. 2017:  [name] is a 86 y.o. female who presents for a six-month follow-up visit   She has a past history of diastolic congestive heart failure. Her last echocardiogram on 02/10/12 showed an ejection fraction of 60% with normal systolic function and grade 1 diastolic dysfunction. She has a past history of pericardial effusion with pericardial tamponade and in 2010 and underwen a pericardial window with good results. she has a history of mild aortic stenosis. Since we last saw her she underwent left hip replacement by Dr. Mayer Camel and has done well.  Her last echocardiogram was 02/10/12 and showed an ejection fraction of 60%. Since last visit she has been feeling well. Since last visit her husband has been diagnosed with early Alzheimer's but fortunately he seems to be responding nicely to Aricept.Her last visit we gave her a trial off gabapentin for peripheral neuropathy.Initially she did not think that it helped but now she thinks that it has helped and she would like to try a stronger dose.  We will increase the dose to 600 mg each evening. She reports that she is only taking 80 mg of Lasix daily and this has been sufficient to keep her edema at a minimum. He has not been having any recent chest pain.  She is not having any increase in peripheral edema on the lower dose of Lasix.  Her breathing is improved.  Oct. 18, 2017: Still some dyspnea. Does not get  any regular  exercise Lots of back and leg pain   Jan. 24, 2018:  Was admitted to the hospital in Dec for shortness of breath .  Was found to have Grade 2 diastolic dysfunction  Was found to have CKD - Diovan was stopped.  BP has been elevated since that time   Aug. 28, 2018:  [name] is seen today .    She does have DOE.  Does not exercise.  Has chronic diastolic CHF  February 05, 3234:  [name]  is seen today for follow-up of her chronic diastolic congestive heart failure and hypertension. Feeling well  Husband died 2 weeks ago .  [name]) ,  Had been in a SNF for 6 months  Walks with walker.  Has lost some weight .  Wt. is 201 lbs today   September 03, 2018: [name] is seen today for follow-up visit.  She has a history of chronic diastolic congestive heart failure.  She has a history of hypertension.  Overall , seems to be doing well   Jan. 20, 2021:  [name] is seen today for follow up of her chronic diastolic congestive heart failure, hypertension, hyperlipidemia. She  [place] in Nov, 2020,  Was in the hospital    Was found to have a transverse type II fracture of the odontoid process.  Was not a candidate for surgical intervention .  Still recovering from the fall.   Legs stay swollen - likely due to her inactivity   July  20 ,2021:  [name] is seen today with son, [name].   Seems to be stable,   Has recurrent bladden infections .  typically e.coli  Her son now recognizes the subtile symptoms and she is able to get treated earlier .  Has chronic leg edema.  Has tried to elevate her legs but is not able to do this regularly   Legs are better in the am.   She uses oxygen at nght now since last hospitalization   Past Medical History:  Diagnosis Date  . Anasarca 04/2009   found to be secondary to pericardial effusion with tamponade/pericardial window done  . Chronic diastolic CHF (congestive heart failure) ([place])   . CKD (chronic kidney disease), stage III   . COPD  (chronic obstructive pulmonary disease) ([place])   . Coronary artery disease    a. mild-mod CAD 2004.  . Diabetes mellitus    insulin dependent  . Herpes labialis    Healing herpes labialis  . Hypercholesterolemia   . Hypertension   . Lumbar radiculopathy   . Lumbar scoliosis   . Morbid obesity ([place])   . Pericardial effusion 04/2009   a. 2010 with tamponade s/p window.  Marland Kitchen PONV (postoperative nausea and vomiting)    has not had any problems since 2001  . Spondylolisthesis   . Spondylosis     Past Surgical History:  Procedure Laterality Date  . BACK SURGERY     microdiskectomy L3-4 on L/partial facetectomy 3-4 on L/removal of synovial cyst on left L3-4  . BREAST BIOPSY     left  . CARDIAC CATHETERIZATION  04/25/2003   L heart cath w/coronary angiography/R femoral artery/ EF 65%/no mitral regurgitation/ angiography L main coronary artery smooth & normal  . EYE SURGERY     ophthalmoscopy/peritomy adjacent to the limbus from the 8 to 2:30 position superiorly  . lumbar laminectomy    . SUBXYPHOID PERICARDIAL WINDOW  04/2009   for pericardial effusion w/pericardial tamponade/sac drained w/20-French Baard drain/transesophageal echocardiogram confimed complete evacution of pericardial fluid  . TOTAL HIP ARTHROPLASTY  03/06/2012   Procedure: TOTAL HIP ARTHROPLASTY;  Surgeon: Kerin Salen, MD;  Location: [facility];  Service: Orthopedics;  Laterality: Left;  DEPUY/PINNACLE, SUMMIT STEM, CUP POLY & CERAMIC  . VAGINAL HYSTERECTOMY       Current Outpatient Medications  Medication Sig Dispense Refill  . acetaminophen (TYLENOL) 500 MG tablet Take 500 mg by mouth See admin instructions. Take one tablet (500 mg) by mouth twice daily, may also take one tablet (500 mg) mid-day as needed for pain/headache    . allopurinol (ZYLOPRIM) 100 MG tablet Take 1 tablet (100 mg total) by mouth daily. 90 tablet 1  . amLODipine (NORVASC) 5 MG tablet Take 1 tablet (5 mg total) by mouth every morning. 90 tablet 2  .  aspirin EC 81 MG tablet Take 81 mg by mouth every morning.     Marland Kitchen atorvastatin (LIPITOR) 40 MG tablet Take 40 mg by mouth every morning.     . ergocalciferol (VITAMIN D2) 50000 UNITS capsule Take 50,000 Units by mouth every Friday.     . fluticasone (FLONASE) 50 MCG/ACT nasal spray Place 2 sprays into both nostrils daily. 16 g 1  . furosemide (LASIX) 80 MG tablet TAKE 1 TABLET BY MOUTH EVERY DAY 90 tablet 2  . gabapentin (NEURONTIN) 100 MG capsule Take 1 capsule (100 mg total) by mouth 3 (three) times daily. 90 capsule 6  . hydrALAZINE (APRESOLINE) 25 MG tablet TAKE 1 TABLET  BY MOUTH TWICE A DAY 180 tablet 2  . insulin aspart (NOVOLOG) 100 UNIT/ML injection Inject 3-8 Units into the skin 3 (three) times daily with meals. Based on a sliding scale.    . insulin glargine (LANTUS) 100 UNIT/ML injection Inject 0.25 mLs (25 Units total) into the skin at bedtime. 10 mL 1  . LACTASE PO Take 1 tablet by mouth 3 (three) times daily as needed (if meal includes cheese or other dairy).    . metoprolol tartrate (LOPRESSOR) 25 MG tablet Take 25 mg by mouth 2 (two) times daily.    . Multiple Vitamin (MULTIVITAMIN WITH MINERALS) TABS tablet Take 1 tablet by mouth daily.    . ONE TOUCH ULTRA TEST test strip 1 each by Other route as needed for other (Insulin). Use as directed    . OXYGEN Inhale 1.5 Lipoprotein Lipase Releasing Units into the lungs. 1.5L to 2L    . Polyethyl Glycol-Propyl Glycol (SYSTANE) 0.4-0.3 % SOLN Apply 2 drops to eye daily as needed (dry eyes).     . Probiotic Product (PROBIOTIC DAILY PO) Take by mouth.    Daryll Brod INSULIN SYR 0.3ML/31G 31G X 5/16" 0.3 ML MISC 1 each by Other route as needed.     No current facility-administered medications for this visit.    Allergies:   Atenolol, Chlorhexidine, Codeine, Lactose intolerance (gi), Lodine [etodolac], Sulfa drugs cross reactors, Sulfamethoxazole, Benzonatate, Escitalopram oxalate, Metformin, Oxycodone-acetaminophen, and Pregabalin    Social  History:  The patient  reports that she has never smoked. She has never used smokeless tobacco. She reports that she does not drink alcohol and does not use drugs.   Family History:  The patient's family history includes Angina in her mother; Coronary artery disease in an other family member; Heart attack in her father; Hypertension in her father; Stroke in her mother.    ROS:  Please see the history of present illness.   Otherwise, review of systems are positive for none.   All other systems are reviewed and negative.     Physical Exam: Blood pressure 128/62, pulse 82, height 5\' 4"  (1.626 m), weight 195 lb (88.5 kg), SpO2 91 %.  GEN:  Elderly female, NAD seen with her son  HEENT: Normal NECK: No JVD; No carotid bruits LYMPHATICS: No lymphadenopathy CARDIAC: RRR , no murmurs, rubs, gallops RESPIRATORY:  [name] to auscultation without rales, wheezing or rhonchi  ABDOMEN: Soft, non-tender, non-distended MUSCULOSKELETAL: 2+ pitting edema .  No deformity  SKIN: Warm and dry NEUROLOGIC:  Alert and oriented x 3   EKG:        Recent Labs: 12/10/2019: B Natriuretic Peptide 531.2 12/17/2019: TSH 0.347 12/24/2019: Magnesium 2.5 03/30/2020: ALT 17; BUN 37; Creatinine, Ser 1.36; Hemoglobin 11.5; Platelets 239.0; Potassium 4.4; Sodium 140    Lipid Panel    Component Value Date/Time   CHOL 146 06/28/2019 0000   TRIG 230 (A) 06/28/2019 0000   HDL 28 (A) 06/28/2019 0000   CHOLHDL 3.1 04/21/2009 0620   VLDL 19 04/21/2009 0620   LDLCALC 72 06/28/2019 0000    Wt Readings from Last 3 Encounters:  05/23/20 195 lb (88.5 kg)  03/30/20 196 lb 6.4 oz (89.1 kg)  12/24/19 193 lb 2 oz (87.6 kg)     ASSESSMENT AND PLAN:  1.  Chronic diastolic heart failure    - has chrnoic leg edema.  Recommended that she elevate her legs.  Have given her information on the lounge doctor leg rest. Overall I think her medications  are stable.  We will have her return in 6 months to see an APP.  2.  History of  pericarditis:    No recurrent symptoms   3. diabetes mellitus with diabetic neuropathy:    4. essential hypertension:    BP is well controlled.       Current medicines are reviewed at length with the patient today.  The patient does not have concerns regarding medicines.    Labs/ tests ordered today include:   No orders of the defined types were placed in this encounter.    Mertie Moores, MD  05/23/2020 3:51 PM    [facility] [place],  [place], [place]  [address] Pager [phone] Phone: [phone]; Fax: [phone]

## 2020-05-23 NOTE — Patient Instructions (Addendum)
Medication Instructions:  Your physician recommends that you continue on your current medications as directed. Please refer to the Current Medication list given to you today.  *If you need a refill on your cardiac medications before your next appointment, please call your pharmacy*   Lab Work: UA urinalysis If you have labs (blood work) drawn today and your tests are completely normal, you will receive your results only by: Marland Kitchen MyChart Message (if you have MyChart) OR . A paper copy in the mail If you have any lab test that is abnormal or we need to change your treatment, we will call you to review the results.   Testing/Procedures: none   Follow-Up: At [facility], you and your health needs are our priority.  As part of our continuing mission to provide you with exceptional heart care, we have created designated Provider Care Teams.  These Care Teams include your primary Cardiologist (physician) and Advanced Practice Providers (APPs -  Physician Assistants and Nurse Practitioners) who all work together to provide you with the care you need, when you need it.  We recommend signing up for the patient portal called "MyChart".  Sign up information is provided on this After Visit Summary.  MyChart is used to connect with patients for Virtual Visits (Telemedicine).  Patients are able to view lab/test results, encounter notes, upcoming appointments, etc.  Non-urgent messages can be sent to your provider as well.   To learn more about what you can do with MyChart, go to [email or web address].    Your next appointment:   6 month(s)  The format for your next appointment:   In Person  Provider:   You may see one of the following Advanced Practice Providers on your designated Care Team:    Richardson Dopp, PA-C  [place], [place]   For your  leg edema you  should do  the following 1. Leg elevation - I recommend the Lounge Dr. Leg rest.  See below for details  2. Salt restriction  -  Use  potassium chloride instead of regular salt as a salt substitute. 3. Walk regularly 4. Compression hose - [facility] 5. Weight loss    Available on [email or web address] Or  Go to [email or web address]

## 2020-05-24 LAB — URINALYSIS
Bilirubin, UA: NEGATIVE
Glucose, UA: NEGATIVE
Ketones, UA: NEGATIVE
Leukocytes,UA: NEGATIVE
Nitrite, UA: NEGATIVE
Protein,UA: NEGATIVE
RBC, UA: NEGATIVE
Specific Gravity, UA: 1.007 (ref 1.005–1.030)
Urobilinogen, Ur: 0.2 mg/dL (ref 0.2–1.0)
pH, UA: 6.5 (ref 5.0–7.5)

## 2020-06-05 DIAGNOSIS — D631 Anemia in chronic kidney disease: Secondary | ICD-10-CM | POA: Diagnosis not present

## 2020-06-05 DIAGNOSIS — N1832 Chronic kidney disease, stage 3b: Secondary | ICD-10-CM | POA: Diagnosis not present

## 2020-06-05 DIAGNOSIS — E1165 Type 2 diabetes mellitus with hyperglycemia: Secondary | ICD-10-CM | POA: Diagnosis not present

## 2020-06-05 DIAGNOSIS — N182 Chronic kidney disease, stage 2 (mild): Secondary | ICD-10-CM | POA: Diagnosis not present

## 2020-06-05 DIAGNOSIS — Z794 Long term (current) use of insulin: Secondary | ICD-10-CM | POA: Diagnosis not present

## 2020-06-05 DIAGNOSIS — E782 Mixed hyperlipidemia: Secondary | ICD-10-CM | POA: Diagnosis not present

## 2020-06-08 DIAGNOSIS — E559 Vitamin D deficiency, unspecified: Secondary | ICD-10-CM | POA: Diagnosis not present

## 2020-06-08 DIAGNOSIS — E782 Mixed hyperlipidemia: Secondary | ICD-10-CM | POA: Diagnosis not present

## 2020-06-08 DIAGNOSIS — I110 Hypertensive heart disease with heart failure: Secondary | ICD-10-CM | POA: Diagnosis not present

## 2020-06-08 DIAGNOSIS — N1832 Chronic kidney disease, stage 3b: Secondary | ICD-10-CM | POA: Diagnosis not present

## 2020-06-08 DIAGNOSIS — I5033 Acute on chronic diastolic (congestive) heart failure: Secondary | ICD-10-CM | POA: Diagnosis not present

## 2020-06-08 DIAGNOSIS — D631 Anemia in chronic kidney disease: Secondary | ICD-10-CM | POA: Diagnosis not present

## 2020-06-08 DIAGNOSIS — E1165 Type 2 diabetes mellitus with hyperglycemia: Secondary | ICD-10-CM | POA: Diagnosis not present

## 2020-06-08 DIAGNOSIS — Z794 Long term (current) use of insulin: Secondary | ICD-10-CM | POA: Diagnosis not present

## 2020-06-26 ENCOUNTER — Emergency Department (HOSPITAL_COMMUNITY)
Admission: EM | Admit: 2020-06-26 | Discharge: 2020-06-27 | Discharge status: Against Medical Advice | Payer: Medicare Other

## 2020-06-26 ENCOUNTER — Other Ambulatory Visit: Payer: Self-pay

## 2020-06-26 NOTE — ED Notes (Signed)
Pt stated they wanted to go due to the wait time and I encouraged her to stay as she had not been here very long. The pt left anyway.

## 2020-06-27 ENCOUNTER — Other Ambulatory Visit: Payer: Self-pay

## 2020-06-27 ENCOUNTER — Ambulatory Visit (INDEPENDENT_AMBULATORY_CARE_PROVIDER_SITE_OTHER): Payer: Medicare Other | Admitting: Family Medicine

## 2020-06-27 ENCOUNTER — Ambulatory Visit (INDEPENDENT_AMBULATORY_CARE_PROVIDER_SITE_OTHER): Payer: Medicare Other

## 2020-06-27 ENCOUNTER — Other Ambulatory Visit: Payer: Medicare Other

## 2020-06-27 ENCOUNTER — Encounter: Payer: Self-pay | Admitting: Family Medicine

## 2020-06-27 ENCOUNTER — Ambulatory Visit: Payer: Self-pay

## 2020-06-27 VITALS — BP 148/78 | HR 68 | Ht 64.0 in | Wt 199.0 lb

## 2020-06-27 DIAGNOSIS — M25531 Pain in right wrist: Secondary | ICD-10-CM

## 2020-06-27 DIAGNOSIS — M79641 Pain in right hand: Secondary | ICD-10-CM | POA: Diagnosis not present

## 2020-06-27 DIAGNOSIS — R300 Dysuria: Secondary | ICD-10-CM | POA: Diagnosis not present

## 2020-06-27 DIAGNOSIS — M1811 Unilateral primary osteoarthritis of first carpometacarpal joint, right hand: Secondary | ICD-10-CM | POA: Diagnosis not present

## 2020-06-27 MED ORDER — DICLOFENAC SODIUM 1 % EX GEL: 4.0000 g | Freq: Four times a day (QID) | CUTANEOUS | 11 refills | Status: DC

## 2020-06-27 NOTE — Progress Notes (Signed)
Subjective:    CC: R arm pain  I, [name], LAT, ATC, am serving as scribe for Dr. Lynne [name].  HPI: Pt is an 86 y/o RHD female presenting w/ c/o R hand, wrist and forearm pain x 9 days w/ no specific MOI.  She states that she did hit her arm/hand and that she does a lot of reading on her iPad.  She locates her pain to her R hand, wrist and forearm. She is mostly homebound. She does have a history of gout but uric acid levels have been improving with allopurinol down from about 10 to about 7 a few months ago.  Radiating pain: yes into the R forearm Swelling: yes in her R hand R UE numbness/tingling: yes in her R hand Aggravating factors: pressure to there hand; any R hand AROM Treatments tried: brace; rest; IBU; oxycodone prn   Pertinent review of Systems: No fevers or chills  Relevant historical information: diabetes, CKD, hypertension, CHF   Objective:    Vitals:   06/27/20 1525  BP: (!) 148/78  Pulse: 68  SpO2: 92%   General: Well Developed, well nourished, and in no acute distress.   MSK: Right wrist and hand swollen dorsally. Swollen first second third digits. Mildly tender palpation diffusely. No erythema. Decreased wrist and hand motion. Negative Tinel's carpal tunnel.  Lab and Radiology Results  X-ray images right wrist obtained today personally and independently reviewed Moderate to mild degenerative changes present throughout wrist. Gap between the scaphoid and lunate present concerning for scapholunate disassociation. Await formal radiology review   Diagnostic Limited MSK Ultrasound of: Right wrist Moderate joint effusion present dorsal inspection of wrist distal radius. No significant tenosynovitis dorsal wrist compartments. Volar wrist reveals largely normal-appearing carpal tunnel and median nerve. Impression: Wrist effusion  Procedure: Real-time Ultrasound Guided Injection of right dorsal wrist Device: Philips Affiniti 50G Images  permanently stored and available for review in PACS Verbal informed consent obtained.  Discussed risks and benefits of procedure. Warned about infection bleeding damage to structures skin hypopigmentation and fat atrophy among others. Patient expresses understanding and agreement Time-out conducted.   Noted no overlying erythema, induration, or other signs of local infection.   Skin prepped in a sterile fashion.   Local anesthesia: Topical Ethyl chloride.   With sterile technique and under real time ultrasound guidance:  40 mg of Kenalog and 1 mL of Marcaine injected easily.   Completed without difficulty   Pain immediately resolved suggesting accurate placement of the medication.   Advised to call if fevers/chills, erythema, induration, drainage, or persistent bleeding.   Images permanently stored and available for review in the ultrasound unit.  Impression: Technically successful ultrasound guided injection.       Impression and Recommendations:    Assessment and Plan: 86 y.o. female with right wrist pain. Etiology is somewhat unclear. Patient has significant pain and swelling ongoing for about a week with no injury. Her history of gout is obviously concerning for gout flare. Plan for steroid injection Voltaren gel and home health occupational therapy/physical therapy. If not proving patient will notify me and we will consider trial of oral colchicine. Check back if not improving or as needed..   Orders Placed This Encounter  Procedures  . [place] LIMITED JOINT SPACE STRUCTURES UP RIGHT(NO LINKED CHARGES)    Order Specific Question:   Reason for Exam (SYMPTOM  OR DIAGNOSIS REQUIRED)    Answer:   R hand pain    Order Specific Question:  Preferred imaging location?    Answer:   [facility]  . DG Wrist Complete Right    Standing Status:   Future    Number of Occurrences:   1    Standing Expiration Date:   06/27/2021    Order Specific Question:   Reason for Exam  (SYMPTOM  OR DIAGNOSIS REQUIRED)    Answer:   eval wrist pain    Order Specific Question:   Preferred imaging location?    Answer:   [facility]    Order Specific Question:   Radiology Contrast Protocol - do NOT remove file path    Answer:   [email or web address]  . Uric acid    Standing Status:   Future    Number of Occurrences:   1    Standing Expiration Date:   06/27/2021  . Ambulatory referral to Home Health    Referral Priority:   Routine    Referral Type:   Home Health Care    Referral Reason:   Specialty Services Required    Requested Specialty:   [place]    Number of Visits Requested:   1   Meds ordered this encounter  Medications  . diclofenac Sodium (VOLTAREN) 1 % GEL    Sig: Apply 4 g topically 4 (four) times daily. To affected joint.    Dispense:  100 g    Refill:  11    Discussed warning signs or symptoms. Please see discharge instructions. Patient expresses understanding.   The above documentation has been reviewed and is accurate and complete [name], M.D.    Of note urinalysis ordered by PCP dysuria associate with this visit but not directly addressed by me. PCP also ordered uric acid which is reasonable

## 2020-06-27 NOTE — Patient Instructions (Addendum)
Thank you for coming in today. Plan for injection and voltaren gel.  Plan for home health   Call or go to the ER if you develop a large red swollen joint with extreme pain or oozing puss.   Use voltaren gel   Use ice or heat which ever feels better.   If not better let me know. I can prescribe colchicine.   Get xray today.   Home health PT/OT.

## 2020-06-28 ENCOUNTER — Telehealth: Payer: Self-pay | Admitting: Family Medicine

## 2020-06-28 LAB — URINALYSIS, ROUTINE W REFLEX MICROSCOPIC
Bacteria, UA: NONE SEEN /HPF
Bilirubin Urine: NEGATIVE
Glucose, UA: NEGATIVE
Hgb urine dipstick: NEGATIVE
Hyaline Cast: NONE SEEN /LPF
Ketones, ur: NEGATIVE
Nitrite: NEGATIVE
Protein, ur: NEGATIVE
RBC / HPF: NONE SEEN /HPF (ref 0–2)
Specific Gravity, Urine: 1.008 (ref 1.001–1.03)
Squamous Epithelial / HPF: NONE SEEN /HPF (ref <=5)
pH: 5.5 (ref 5.0–8.0)

## 2020-06-28 LAB — URIC ACID: Uric Acid, Serum: 7.8 mg/dL — ABNORMAL HIGH (ref 2.5–7.0)

## 2020-06-28 MED ORDER — ALLOPURINOL 100 MG PO TABS: 200.0000 mg | ORAL_TABLET | Freq: Every day | ORAL | 1 refills | Status: DC

## 2020-06-28 NOTE — Telephone Encounter (Signed)
Caryl Pina from [facility] called about our referral . Needs to confirm that Dr. Georgina Snell wants BOTH OT and PT. She said the notes mention both but the order is for OT.

## 2020-06-28 NOTE — Progress Notes (Signed)
X-ray wrist does show some arthritis in the wrist.2 of the wrist bones are further apart than they should be which indicates that there is probably an old injury that injured the ligament in your wrist.  This probably is not the main cause of your wrist pain but may be contributing.  No fractures visible.

## 2020-06-28 NOTE — Telephone Encounter (Signed)
[facility] phone # [phone]

## 2020-06-28 NOTE — Addendum Note (Signed)
Addended by: Gregor Hams on: 06/28/2020 07:25 AM   Modules accepted: Orders

## 2020-06-28 NOTE — Progress Notes (Signed)
Uric acid is elevated at 7.8. Plan to increase allopurinol to 200 mg daily. Take 2 pills daily. Labs should be rechecked in 3 months or so.

## 2020-06-29 NOTE — Telephone Encounter (Signed)
Called [name] back and patient had informed her that the cortisone injection she received at her office visit had helped and she is feeling so much better/no longer in pain and is not wanting to do the OT/PT.

## 2020-06-29 NOTE — Telephone Encounter (Signed)
Are you wanting OT or PT or both

## 2020-06-29 NOTE — Telephone Encounter (Signed)
Both is good.  Mostly OT though.

## 2020-06-30 ENCOUNTER — Other Ambulatory Visit: Payer: Self-pay | Admitting: Internal Medicine

## 2020-06-30 ENCOUNTER — Ambulatory Visit: Payer: Medicare Other | Admitting: Internal Medicine

## 2020-06-30 MED ORDER — CEPHALEXIN 500 MG PO CAPS: 500.0000 mg | ORAL_CAPSULE | Freq: Two times a day (BID) | ORAL | 0 refills | Status: DC

## 2020-07-10 ENCOUNTER — Encounter: Payer: Self-pay | Admitting: Internal Medicine

## 2020-07-28 DIAGNOSIS — Z23 Encounter for immunization: Secondary | ICD-10-CM | POA: Diagnosis not present

## 2020-08-14 ENCOUNTER — Other Ambulatory Visit: Payer: Self-pay | Admitting: Internal Medicine

## 2020-09-01 DIAGNOSIS — D3131 Benign neoplasm of right choroid: Secondary | ICD-10-CM | POA: Diagnosis not present

## 2020-09-01 DIAGNOSIS — H35372 Puckering of macula, left eye: Secondary | ICD-10-CM | POA: Diagnosis not present

## 2020-09-01 DIAGNOSIS — Z961 Presence of intraocular lens: Secondary | ICD-10-CM | POA: Diagnosis not present

## 2020-09-01 DIAGNOSIS — H35033 Hypertensive retinopathy, bilateral: Secondary | ICD-10-CM | POA: Diagnosis not present

## 2020-09-01 DIAGNOSIS — E113553 Type 2 diabetes mellitus with stable proliferative diabetic retinopathy, bilateral: Secondary | ICD-10-CM | POA: Diagnosis not present

## 2020-09-11 ENCOUNTER — Other Ambulatory Visit: Payer: Self-pay | Admitting: Cardiovascular Disease

## 2020-09-12 DIAGNOSIS — E559 Vitamin D deficiency, unspecified: Secondary | ICD-10-CM | POA: Diagnosis not present

## 2020-09-12 DIAGNOSIS — D631 Anemia in chronic kidney disease: Secondary | ICD-10-CM | POA: Diagnosis not present

## 2020-09-12 DIAGNOSIS — N1832 Chronic kidney disease, stage 3b: Secondary | ICD-10-CM | POA: Diagnosis not present

## 2020-09-12 DIAGNOSIS — N182 Chronic kidney disease, stage 2 (mild): Secondary | ICD-10-CM | POA: Diagnosis not present

## 2020-09-12 DIAGNOSIS — E1165 Type 2 diabetes mellitus with hyperglycemia: Secondary | ICD-10-CM | POA: Diagnosis not present

## 2020-09-12 DIAGNOSIS — I10 Essential (primary) hypertension: Secondary | ICD-10-CM | POA: Diagnosis not present

## 2020-09-12 DIAGNOSIS — E782 Mixed hyperlipidemia: Secondary | ICD-10-CM | POA: Diagnosis not present

## 2020-09-12 DIAGNOSIS — Z794 Long term (current) use of insulin: Secondary | ICD-10-CM | POA: Diagnosis not present

## 2020-09-14 DIAGNOSIS — E113393 Type 2 diabetes mellitus with moderate nonproliferative diabetic retinopathy without macular edema, bilateral: Secondary | ICD-10-CM | POA: Diagnosis not present

## 2020-09-14 DIAGNOSIS — E1165 Type 2 diabetes mellitus with hyperglycemia: Secondary | ICD-10-CM | POA: Diagnosis not present

## 2020-09-14 DIAGNOSIS — E1122 Type 2 diabetes mellitus with diabetic chronic kidney disease: Secondary | ICD-10-CM | POA: Diagnosis not present

## 2020-09-14 DIAGNOSIS — N1832 Chronic kidney disease, stage 3b: Secondary | ICD-10-CM | POA: Diagnosis not present

## 2020-09-14 DIAGNOSIS — E782 Mixed hyperlipidemia: Secondary | ICD-10-CM | POA: Diagnosis not present

## 2020-09-14 DIAGNOSIS — Z794 Long term (current) use of insulin: Secondary | ICD-10-CM | POA: Diagnosis not present

## 2020-09-14 DIAGNOSIS — D631 Anemia in chronic kidney disease: Secondary | ICD-10-CM | POA: Diagnosis not present

## 2020-09-14 DIAGNOSIS — I5032 Chronic diastolic (congestive) heart failure: Secondary | ICD-10-CM | POA: Diagnosis not present

## 2020-09-14 DIAGNOSIS — E559 Vitamin D deficiency, unspecified: Secondary | ICD-10-CM | POA: Diagnosis not present

## 2020-09-14 DIAGNOSIS — I130 Hypertensive heart and chronic kidney disease with heart failure and stage 1 through stage 4 chronic kidney disease, or unspecified chronic kidney disease: Secondary | ICD-10-CM | POA: Diagnosis not present

## 2020-09-14 DIAGNOSIS — E1142 Type 2 diabetes mellitus with diabetic polyneuropathy: Secondary | ICD-10-CM | POA: Diagnosis not present

## 2020-09-21 ENCOUNTER — Encounter: Payer: Self-pay | Admitting: Internal Medicine

## 2020-09-21 ENCOUNTER — Ambulatory Visit (INDEPENDENT_AMBULATORY_CARE_PROVIDER_SITE_OTHER): Payer: Medicare Other | Admitting: Internal Medicine

## 2020-09-21 ENCOUNTER — Other Ambulatory Visit: Payer: Self-pay

## 2020-09-21 VITALS — BP 138/72 | HR 79 | Temp 97.9°F | Ht 64.0 in | Wt 198.0 lb

## 2020-09-21 DIAGNOSIS — I10 Essential (primary) hypertension: Secondary | ICD-10-CM

## 2020-09-21 DIAGNOSIS — H6122 Impacted cerumen, left ear: Secondary | ICD-10-CM

## 2020-09-21 DIAGNOSIS — M8949 Other hypertrophic osteoarthropathy, multiple sites: Secondary | ICD-10-CM

## 2020-09-21 DIAGNOSIS — N1832 Chronic kidney disease, stage 3b: Secondary | ICD-10-CM | POA: Diagnosis not present

## 2020-09-21 DIAGNOSIS — M1A30X0 Chronic gout due to renal impairment, unspecified site, without tophus (tophi): Secondary | ICD-10-CM

## 2020-09-21 DIAGNOSIS — N390 Urinary tract infection, site not specified: Secondary | ICD-10-CM | POA: Diagnosis not present

## 2020-09-21 DIAGNOSIS — M159 Polyosteoarthritis, unspecified: Secondary | ICD-10-CM

## 2020-09-21 MED ORDER — ALPRAZOLAM 0.25 MG PO TABS: 0.2500 mg | ORAL_TABLET | Freq: Every evening | ORAL | 5 refills | Status: DC | PRN

## 2020-09-21 MED ORDER — OXYCODONE-ACETAMINOPHEN 5-325 MG PO TABS: 1.0000 | ORAL_TABLET | ORAL | 0 refills | Status: DC | PRN

## 2020-09-21 NOTE — Patient Instructions (Addendum)
We will check the labs today and call you back about the results.   We will get you in the ENT doctor to check the hearing.  We have sent in the xanax and the oxycodone.

## 2020-09-21 NOTE — Progress Notes (Signed)
   Subjective:   Patient ID: [name], female    DOB: [birth year], 86 y.o.   MRN: [id number]  HPI The patient is an 86 YO female coming in for follow up gout (allopurinol increased by sports medicine and she is tolerating well, ckd stage 3, denies flares, uric acid needs repeat) and concern for UTI (some burning in the last few days, has had multiple UTI in the past with mental status changes, overall stable, has tried more fluids but limited by heart failure) and hearing loss (some wax left ear from time to time, would like to see ENT for removal and then hearing test same day for possible hearing aid assessment).   Review of Systems  Constitutional: Negative.   HENT: Negative.   Eyes: Negative.   Respiratory: Negative.  Negative for cough, chest tightness and shortness of breath.   Cardiovascular: Negative.  Negative for chest pain, palpitations and leg swelling.  Gastrointestinal: Negative for abdominal distention, abdominal pain, constipation, diarrhea, nausea and vomiting.  Genitourinary: Positive for dysuria, frequency and urgency.  Musculoskeletal: Negative.   Skin: Negative.   Neurological: Negative.   Psychiatric/Behavioral: Negative.     Objective:  Physical Exam Constitutional:      Appearance: She is well-developed.  HENT:     Head: Normocephalic and atraumatic.  Cardiovascular:     Rate and Rhythm: Normal rate and regular rhythm.  Pulmonary:     Effort: Pulmonary effort is normal. No respiratory distress.     Breath sounds: Normal breath sounds. No wheezing or rales.  Abdominal:     General: Bowel sounds are normal. There is no distension.     Palpations: Abdomen is soft.     Tenderness: There is no abdominal tenderness. There is no rebound.  Musculoskeletal:     Cervical back: Normal range of motion.  Skin:    General: Skin is warm and dry.  Neurological:     Mental Status: She is alert and oriented to person, place, and time.     Coordination: Coordination  abnormal.     Comments: wheelchair     Vitals:   09/21/20 1525  BP: 138/72  Pulse: 79  Temp: 97.9 F (36.6 C)  TempSrc: Oral  SpO2: 95%  Weight: 198 lb (89.8 kg)  Height: 5\' 4"  (1.626 m)    This visit occurred during the SARS-CoV-2 public health emergency.  Safety protocols were in place, including screening questions prior to the visit, additional usage of staff PPE, and extensive cleaning of exam room while observing appropriate contact time as indicated for disinfecting solutions.   Assessment & Plan:

## 2020-09-22 ENCOUNTER — Other Ambulatory Visit: Payer: Self-pay | Admitting: Internal Medicine

## 2020-09-22 DIAGNOSIS — H6122 Impacted cerumen, left ear: Secondary | ICD-10-CM | POA: Insufficient documentation

## 2020-09-22 DIAGNOSIS — M1990 Unspecified osteoarthritis, unspecified site: Secondary | ICD-10-CM | POA: Insufficient documentation

## 2020-09-22 LAB — URINALYSIS, ROUTINE W REFLEX MICROSCOPIC
Bilirubin Urine: NEGATIVE
Hgb urine dipstick: NEGATIVE
Ketones, ur: NEGATIVE
Nitrite: NEGATIVE
Specific Gravity, Urine: 1.015 (ref 1.000–1.030)
Total Protein, Urine: NEGATIVE
Urine Glucose: NEGATIVE
Urobilinogen, UA: 0.2 (ref 0.0–1.0)
pH: 6.5 (ref 5.0–8.0)

## 2020-09-22 LAB — COMPREHENSIVE METABOLIC PANEL
ALT: 16 U/L (ref 0–35)
AST: 19 U/L (ref 0–37)
Albumin: 4 g/dL (ref 3.5–5.2)
Alkaline Phosphatase: 80 U/L (ref 39–117)
BUN: 36 mg/dL — ABNORMAL HIGH (ref 6–23)
CO2: 32 mEq/L (ref 19–32)
Calcium: 9.4 mg/dL (ref 8.4–10.5)
Chloride: 100 mEq/L (ref 96–112)
Creatinine, Ser: 1.38 mg/dL — ABNORMAL HIGH (ref 0.40–1.20)
GFR: 34.49 mL/min — ABNORMAL LOW (ref >60.00)
Glucose, Bld: 143 mg/dL — ABNORMAL HIGH (ref 70–99)
Potassium: 4.4 mEq/L (ref 3.5–5.1)
Sodium: 139 mEq/L (ref 135–145)
Total Bilirubin: 0.4 mg/dL (ref 0.2–1.2)
Total Protein: 6.4 g/dL (ref 6.0–8.3)

## 2020-09-22 LAB — URIC ACID: Uric Acid, Serum: 5.2 mg/dL (ref 2.4–7.0)

## 2020-09-22 MED ORDER — NITROFURANTOIN MONOHYD MACRO 100 MG PO CAPS: 100.0000 mg | ORAL_CAPSULE | Freq: Two times a day (BID) | ORAL | 0 refills | Status: DC

## 2020-09-22 NOTE — Assessment & Plan Note (Signed)
Checking CMP for stability.  

## 2020-09-22 NOTE — Assessment & Plan Note (Signed)
Referral to ENT for same day wax removal and hearing test for assessment.

## 2020-09-22 NOTE — Assessment & Plan Note (Signed)
BP at goal, checking CMP and adjust as needed. Complicated by CKD.

## 2020-09-22 NOTE — Assessment & Plan Note (Signed)
Checking U/A and treat as appropriate. Previously has done well with macrobid.

## 2020-09-22 NOTE — Assessment & Plan Note (Signed)
Rx oxycodone small amount for rare usage for pain. Reviewed [place] narcotic database and appropriate.

## 2020-09-22 NOTE — Assessment & Plan Note (Signed)
Checking uric acid and adjust allopurinol if needed for goal uric acid <6.

## 2020-11-02 ENCOUNTER — Encounter: Payer: Self-pay | Admitting: Internal Medicine

## 2020-11-03 ENCOUNTER — Inpatient Hospital Stay (HOSPITAL_COMMUNITY)
Admission: EM | Admit: 2020-11-03 | Discharge: 2020-11-08 | DRG: 189 | Disposition: A | Discharge status: Home / Self Care | Payer: Medicare Other | Attending: Internal Medicine | Admitting: Internal Medicine

## 2020-11-03 ENCOUNTER — Emergency Department (HOSPITAL_COMMUNITY): Payer: Medicare Other

## 2020-11-03 ENCOUNTER — Inpatient Hospital Stay (HOSPITAL_COMMUNITY): Payer: Medicare Other

## 2020-11-03 ENCOUNTER — Other Ambulatory Visit: Payer: Self-pay

## 2020-11-03 DIAGNOSIS — E1122 Type 2 diabetes mellitus with diabetic chronic kidney disease: Secondary | ICD-10-CM | POA: Diagnosis present

## 2020-11-03 DIAGNOSIS — N179 Acute kidney failure, unspecified: Secondary | ICD-10-CM | POA: Diagnosis present

## 2020-11-03 DIAGNOSIS — M109 Gout, unspecified: Secondary | ICD-10-CM | POA: Diagnosis present

## 2020-11-03 DIAGNOSIS — G928 Other toxic encephalopathy: Secondary | ICD-10-CM | POA: Diagnosis present

## 2020-11-03 DIAGNOSIS — J9621 Acute and chronic respiratory failure with hypoxia: Secondary | ICD-10-CM | POA: Diagnosis present

## 2020-11-03 DIAGNOSIS — J9811 Atelectasis: Secondary | ICD-10-CM | POA: Diagnosis not present

## 2020-11-03 DIAGNOSIS — I5033 Acute on chronic diastolic (congestive) heart failure: Secondary | ICD-10-CM | POA: Diagnosis present

## 2020-11-03 DIAGNOSIS — I130 Hypertensive heart and chronic kidney disease with heart failure and stage 1 through stage 4 chronic kidney disease, or unspecified chronic kidney disease: Secondary | ICD-10-CM | POA: Diagnosis present

## 2020-11-03 DIAGNOSIS — J440 Chronic obstructive pulmonary disease with acute lower respiratory infection: Secondary | ICD-10-CM | POA: Diagnosis present

## 2020-11-03 DIAGNOSIS — J9622 Acute and chronic respiratory failure with hypercapnia: Secondary | ICD-10-CM | POA: Diagnosis not present

## 2020-11-03 DIAGNOSIS — J441 Chronic obstructive pulmonary disease with (acute) exacerbation: Secondary | ICD-10-CM | POA: Diagnosis present

## 2020-11-03 DIAGNOSIS — Z7982 Long term (current) use of aspirin: Secondary | ICD-10-CM

## 2020-11-03 DIAGNOSIS — N1832 Chronic kidney disease, stage 3b: Secondary | ICD-10-CM | POA: Diagnosis present

## 2020-11-03 DIAGNOSIS — Z1611 Resistance to penicillins: Secondary | ICD-10-CM | POA: Diagnosis present

## 2020-11-03 DIAGNOSIS — E1149 Type 2 diabetes mellitus with other diabetic neurological complication: Secondary | ICD-10-CM | POA: Diagnosis present

## 2020-11-03 DIAGNOSIS — I358 Other nonrheumatic aortic valve disorders: Secondary | ICD-10-CM | POA: Diagnosis present

## 2020-11-03 DIAGNOSIS — Z96642 Presence of left artificial hip joint: Secondary | ICD-10-CM

## 2020-11-03 DIAGNOSIS — E669 Obesity, unspecified: Secondary | ICD-10-CM | POA: Diagnosis present

## 2020-11-03 DIAGNOSIS — Z823 Family history of stroke: Secondary | ICD-10-CM

## 2020-11-03 DIAGNOSIS — Z20822 Contact with and (suspected) exposure to covid-19: Secondary | ICD-10-CM | POA: Diagnosis present

## 2020-11-03 DIAGNOSIS — I5032 Chronic diastolic (congestive) heart failure: Secondary | ICD-10-CM | POA: Diagnosis present

## 2020-11-03 DIAGNOSIS — E875 Hyperkalemia: Secondary | ICD-10-CM | POA: Diagnosis not present

## 2020-11-03 DIAGNOSIS — E782 Mixed hyperlipidemia: Secondary | ICD-10-CM | POA: Diagnosis present

## 2020-11-03 DIAGNOSIS — R29818 Other symptoms and signs involving the nervous system: Secondary | ICD-10-CM | POA: Diagnosis present

## 2020-11-03 DIAGNOSIS — E860 Dehydration: Secondary | ICD-10-CM | POA: Diagnosis present

## 2020-11-03 DIAGNOSIS — B964 Proteus (mirabilis) (morganii) as the cause of diseases classified elsewhere: Secondary | ICD-10-CM | POA: Diagnosis present

## 2020-11-03 DIAGNOSIS — R0689 Other abnormalities of breathing: Secondary | ICD-10-CM | POA: Diagnosis present

## 2020-11-03 DIAGNOSIS — G4733 Obstructive sleep apnea (adult) (pediatric): Secondary | ICD-10-CM | POA: Diagnosis present

## 2020-11-03 DIAGNOSIS — E872 Acidosis: Secondary | ICD-10-CM | POA: Diagnosis present

## 2020-11-03 DIAGNOSIS — I444 Left anterior fascicular block: Secondary | ICD-10-CM | POA: Diagnosis present

## 2020-11-03 DIAGNOSIS — J449 Chronic obstructive pulmonary disease, unspecified: Secondary | ICD-10-CM | POA: Diagnosis present

## 2020-11-03 DIAGNOSIS — I517 Cardiomegaly: Secondary | ICD-10-CM | POA: Diagnosis not present

## 2020-11-03 DIAGNOSIS — N3001 Acute cystitis with hematuria: Secondary | ICD-10-CM | POA: Diagnosis present

## 2020-11-03 DIAGNOSIS — Z6834 Body mass index (BMI) 34.0-34.9, adult: Secondary | ICD-10-CM

## 2020-11-03 DIAGNOSIS — E1169 Type 2 diabetes mellitus with other specified complication: Secondary | ICD-10-CM | POA: Diagnosis present

## 2020-11-03 DIAGNOSIS — E0590 Thyrotoxicosis, unspecified without thyrotoxic crisis or storm: Secondary | ICD-10-CM | POA: Diagnosis present

## 2020-11-03 DIAGNOSIS — N183 Chronic kidney disease, stage 3 unspecified: Secondary | ICD-10-CM | POA: Diagnosis present

## 2020-11-03 DIAGNOSIS — J209 Acute bronchitis, unspecified: Secondary | ICD-10-CM | POA: Diagnosis present

## 2020-11-03 DIAGNOSIS — I10 Essential (primary) hypertension: Secondary | ICD-10-CM | POA: Diagnosis present

## 2020-11-03 DIAGNOSIS — I499 Cardiac arrhythmia, unspecified: Secondary | ICD-10-CM | POA: Diagnosis not present

## 2020-11-03 DIAGNOSIS — R0602 Shortness of breath: Secondary | ICD-10-CM | POA: Diagnosis not present

## 2020-11-03 DIAGNOSIS — Z9981 Dependence on supplemental oxygen: Secondary | ICD-10-CM

## 2020-11-03 DIAGNOSIS — I440 Atrioventricular block, first degree: Secondary | ICD-10-CM | POA: Diagnosis present

## 2020-11-03 DIAGNOSIS — R7989 Other specified abnormal findings of blood chemistry: Secondary | ICD-10-CM | POA: Diagnosis present

## 2020-11-03 DIAGNOSIS — Z885 Allergy status to narcotic agent status: Secondary | ICD-10-CM

## 2020-11-03 DIAGNOSIS — R410 Disorientation, unspecified: Secondary | ICD-10-CM | POA: Diagnosis not present

## 2020-11-03 DIAGNOSIS — E1165 Type 2 diabetes mellitus with hyperglycemia: Secondary | ICD-10-CM | POA: Diagnosis present

## 2020-11-03 DIAGNOSIS — Z79899 Other long term (current) drug therapy: Secondary | ICD-10-CM | POA: Diagnosis not present

## 2020-11-03 DIAGNOSIS — E739 Lactose intolerance, unspecified: Secondary | ICD-10-CM | POA: Diagnosis present

## 2020-11-03 DIAGNOSIS — Z794 Long term (current) use of insulin: Secondary | ICD-10-CM

## 2020-11-03 DIAGNOSIS — M4802 Spinal stenosis, cervical region: Secondary | ICD-10-CM | POA: Diagnosis not present

## 2020-11-03 DIAGNOSIS — R5381 Other malaise: Secondary | ICD-10-CM | POA: Diagnosis present

## 2020-11-03 DIAGNOSIS — M47812 Spondylosis without myelopathy or radiculopathy, cervical region: Secondary | ICD-10-CM | POA: Diagnosis not present

## 2020-11-03 DIAGNOSIS — Z90710 Acquired absence of both cervix and uterus: Secondary | ICD-10-CM

## 2020-11-03 DIAGNOSIS — Z66 Do not resuscitate: Secondary | ICD-10-CM | POA: Diagnosis present

## 2020-11-03 DIAGNOSIS — I2510 Atherosclerotic heart disease of native coronary artery without angina pectoris: Secondary | ICD-10-CM | POA: Diagnosis present

## 2020-11-03 DIAGNOSIS — Z888 Allergy status to other drugs, medicaments and biological substances status: Secondary | ICD-10-CM

## 2020-11-03 DIAGNOSIS — Z8249 Family history of ischemic heart disease and other diseases of the circulatory system: Secondary | ICD-10-CM

## 2020-11-03 DIAGNOSIS — Z882 Allergy status to sulfonamides status: Secondary | ICD-10-CM

## 2020-11-03 DIAGNOSIS — M4312 Spondylolisthesis, cervical region: Secondary | ICD-10-CM | POA: Diagnosis not present

## 2020-11-03 DIAGNOSIS — Z881 Allergy status to other antibiotic agents status: Secondary | ICD-10-CM

## 2020-11-03 LAB — COMPREHENSIVE METABOLIC PANEL
ALT: 57 U/L — ABNORMAL HIGH (ref 0–44)
AST: 55 U/L — ABNORMAL HIGH (ref 15–41)
Albumin: 3.5 g/dL (ref 3.5–5.0)
Alkaline Phosphatase: 77 U/L (ref 38–126)
Anion gap: 12 (ref 5–15)
BUN: 47 mg/dL — ABNORMAL HIGH (ref 8–23)
CO2: 27 mmol/L (ref 22–32)
Calcium: 8.8 mg/dL — ABNORMAL LOW (ref 8.9–10.3)
Chloride: 95 mmol/L — ABNORMAL LOW (ref 98–111)
Creatinine, Ser: 1.78 mg/dL — ABNORMAL HIGH (ref 0.44–1.00)
GFR, Estimated: 27 mL/min — ABNORMAL LOW (ref >60)
Glucose, Bld: 331 mg/dL — ABNORMAL HIGH (ref 70–99)
Potassium: 5.9 mmol/L — ABNORMAL HIGH (ref 3.5–5.1)
Sodium: 134 mmol/L — ABNORMAL LOW (ref 135–145)
Total Bilirubin: 0.6 mg/dL (ref 0.3–1.2)
Total Protein: 5.9 g/dL — ABNORMAL LOW (ref 6.5–8.1)

## 2020-11-03 LAB — CBC WITH DIFFERENTIAL/PLATELET
Abs Immature Granulocytes: 0.33 10*3/uL — ABNORMAL HIGH (ref 0.00–0.07)
Basophils Absolute: 0 10*3/uL (ref 0.0–0.1)
Basophils Relative: 0 %
Eosinophils Absolute: 0 10*3/uL (ref 0.0–0.5)
Eosinophils Relative: 0 %
HCT: 38.2 % (ref 36.0–46.0)
Hemoglobin: 11.2 g/dL — ABNORMAL LOW (ref 12.0–15.0)
Immature Granulocytes: 2 %
Lymphocytes Relative: 6 %
Lymphs Abs: 0.8 10*3/uL (ref 0.7–4.0)
MCH: 28.1 pg (ref 26.0–34.0)
MCHC: 29.3 g/dL — ABNORMAL LOW (ref 30.0–36.0)
MCV: 96 fL (ref 80.0–100.0)
Monocytes Absolute: 0.4 10*3/uL (ref 0.1–1.0)
Monocytes Relative: 3 %
Neutro Abs: 12.7 10*3/uL — ABNORMAL HIGH (ref 1.7–7.7)
Neutrophils Relative %: 89 %
Platelets: 217 10*3/uL (ref 150–400)
RBC: 3.98 MIL/uL (ref 3.87–5.11)
RDW: 14.7 % (ref 11.5–15.5)
WBC: 14.2 10*3/uL — ABNORMAL HIGH (ref 4.0–10.5)
nRBC: 0.1 % (ref 0.0–0.2)

## 2020-11-03 LAB — URINALYSIS, ROUTINE W REFLEX MICROSCOPIC
Bilirubin Urine: NEGATIVE
Glucose, UA: NEGATIVE mg/dL
Hgb urine dipstick: NEGATIVE
Ketones, ur: NEGATIVE mg/dL
Nitrite: NEGATIVE
Protein, ur: NEGATIVE mg/dL
Specific Gravity, Urine: 1.025 (ref 1.005–1.030)
pH: 5.5 (ref 5.0–8.0)

## 2020-11-03 LAB — BRAIN NATRIURETIC PEPTIDE: B Natriuretic Peptide: 970.6 pg/mL — ABNORMAL HIGH (ref 0.0–100.0)

## 2020-11-03 LAB — I-STAT ARTERIAL BLOOD GAS, ED
Acid-Base Excess: 3 mmol/L — ABNORMAL HIGH (ref 0.0–2.0)
Bicarbonate: 33.1 mmol/L — ABNORMAL HIGH (ref 20.0–28.0)
Calcium, Ion: 1.28 mmol/L (ref 1.15–1.40)
HCT: 36 % (ref 36.0–46.0)
Hemoglobin: 12.2 g/dL (ref 12.0–15.0)
O2 Saturation: 97 %
Potassium: 5.2 mmol/L — ABNORMAL HIGH (ref 3.5–5.1)
Sodium: 133 mmol/L — ABNORMAL LOW (ref 135–145)
TCO2: 35 mmol/L — ABNORMAL HIGH (ref 22–32)
pCO2 arterial: 77.6 mmHg (ref 32.0–48.0)
pH, Arterial: 7.238 — ABNORMAL LOW (ref 7.350–7.450)
pO2, Arterial: 106 mmHg (ref 83.0–108.0)

## 2020-11-03 LAB — I-STAT CHEM 8, ED
BUN: 52 mg/dL — ABNORMAL HIGH (ref 8–23)
Calcium, Ion: 1.23 mmol/L (ref 1.15–1.40)
Chloride: 94 mmol/L — ABNORMAL LOW (ref 98–111)
Creatinine, Ser: 1.7 mg/dL — ABNORMAL HIGH (ref 0.44–1.00)
Glucose, Bld: 302 mg/dL — ABNORMAL HIGH (ref 70–99)
HCT: 36 % (ref 36.0–46.0)
Hemoglobin: 12.2 g/dL (ref 12.0–15.0)
Potassium: 5.7 mmol/L — ABNORMAL HIGH (ref 3.5–5.1)
Sodium: 134 mmol/L — ABNORMAL LOW (ref 135–145)
TCO2: 34 mmol/L — ABNORMAL HIGH (ref 22–32)

## 2020-11-03 LAB — I-STAT VENOUS BLOOD GAS, ED
Acid-Base Excess: 6 mmol/L — ABNORMAL HIGH (ref 0.0–2.0)
Bicarbonate: 35.4 mmol/L — ABNORMAL HIGH (ref 20.0–28.0)
Calcium, Ion: 1.21 mmol/L (ref 1.15–1.40)
HCT: 35 % — ABNORMAL LOW (ref 36.0–46.0)
Hemoglobin: 11.9 g/dL — ABNORMAL LOW (ref 12.0–15.0)
O2 Saturation: 99 %
Potassium: 5.7 mmol/L — ABNORMAL HIGH (ref 3.5–5.1)
Sodium: 133 mmol/L — ABNORMAL LOW (ref 135–145)
TCO2: 38 mmol/L — ABNORMAL HIGH (ref 22–32)
pCO2, Ven: 81.9 mmHg (ref 44.0–60.0)
pH, Ven: 7.244 — ABNORMAL LOW (ref 7.250–7.430)
pO2, Ven: 160 mmHg — ABNORMAL HIGH (ref 32.0–45.0)

## 2020-11-03 LAB — URINALYSIS, MICROSCOPIC (REFLEX): RBC / HPF: NONE SEEN RBC/hpf (ref 0–5)

## 2020-11-03 LAB — CBG MONITORING, ED: Glucose-Capillary: 274 mg/dL — ABNORMAL HIGH (ref 70–99)

## 2020-11-03 LAB — ECHOCARDIOGRAM COMPLETE
Area-P 1/2: 3.65 cm2
Height: 64 in
S' Lateral: 2.9 cm
Weight: 3174.62 oz

## 2020-11-03 LAB — RESP PANEL BY RT-PCR (FLU A&B, COVID) ARPGX2
Influenza A by PCR: NEGATIVE
Influenza B by PCR: NEGATIVE
SARS Coronavirus 2 by RT PCR: NEGATIVE

## 2020-11-03 LAB — AMMONIA: Ammonia: 28 umol/L (ref 9–35)

## 2020-11-03 LAB — GLUCOSE, CAPILLARY: Glucose-Capillary: 286 mg/dL — ABNORMAL HIGH (ref 70–99)

## 2020-11-03 LAB — CK: Total CK: 64 U/L (ref 38–234)

## 2020-11-03 MED ORDER — POLYETHYLENE GLYCOL 3350 17 G PO PACK
17.0000 g | PACK | Freq: Every day | ORAL | Status: DC | PRN
Start: 2020-11-03 — End: 2020-11-08

## 2020-11-03 MED ORDER — PERFLUTREN LIPID MICROSPHERE
1.0000 mL | INTRAVENOUS | Status: AC | PRN
  Administered 2020-11-03: 3 mL via INTRAVENOUS
  Filled 2020-11-03: qty 10

## 2020-11-03 MED ORDER — HYDRALAZINE HCL 20 MG/ML IJ SOLN
5.0000 mg | Freq: Four times a day (QID) | INTRAMUSCULAR | Status: DC | PRN
  Administered 2020-11-06: 5 mg via INTRAVENOUS
  Filled 2020-11-03: qty 1

## 2020-11-03 MED ORDER — ENOXAPARIN SODIUM 30 MG/0.3ML ~~LOC~~ SOLN
30.0000 mg | SUBCUTANEOUS | Status: DC
  Administered 2020-11-04 – 2020-11-07 (×4): 30 mg via SUBCUTANEOUS
  Filled 2020-11-03 (×4): qty 0.3

## 2020-11-03 MED ORDER — ALBUTEROL SULFATE HFA 108 (90 BASE) MCG/ACT IN AERS
4.0000 | INHALATION_SPRAY | Freq: Once | RESPIRATORY_TRACT | Status: AC
  Administered 2020-11-03: 4 via RESPIRATORY_TRACT
  Filled 2020-11-03: qty 6.7

## 2020-11-03 MED ORDER — ONDANSETRON HCL 4 MG PO TABS: 4.0000 mg | ORAL_TABLET | Freq: Four times a day (QID) | ORAL | Status: DC | PRN

## 2020-11-03 MED ORDER — ALLOPURINOL 100 MG PO TABS
100.0000 mg | ORAL_TABLET | Freq: Two times a day (BID) | ORAL | Status: DC
  Administered 2020-11-04: 100 mg via ORAL
  Filled 2020-11-03 (×2): qty 1

## 2020-11-03 MED ORDER — MAGNESIUM SULFATE 2 GM/50ML IV SOLN
2.0000 g | Freq: Once | INTRAVENOUS | Status: AC
  Administered 2020-11-03: 2 g via INTRAVENOUS
  Filled 2020-11-03: qty 50

## 2020-11-03 MED ORDER — ALBUTEROL (5 MG/ML) CONTINUOUS INHALATION SOLN
10.0000 mg/h | INHALATION_SOLUTION | Freq: Once | RESPIRATORY_TRACT | Status: AC
  Administered 2020-11-03: 10 mg/h via RESPIRATORY_TRACT
  Filled 2020-11-03: qty 20

## 2020-11-03 MED ORDER — IPRATROPIUM-ALBUTEROL 0.5-2.5 (3) MG/3ML IN SOLN
3.0000 mL | Freq: Once | RESPIRATORY_TRACT | Status: AC
  Administered 2020-11-03: 3 mL via RESPIRATORY_TRACT
  Filled 2020-11-03: qty 3

## 2020-11-03 MED ORDER — ATORVASTATIN CALCIUM 40 MG PO TABS
40.0000 mg | ORAL_TABLET | Freq: Every morning | ORAL | Status: DC
  Administered 2020-11-04 – 2020-11-08 (×5): 40 mg via ORAL
  Filled 2020-11-03 (×5): qty 1

## 2020-11-03 MED ORDER — INSULIN ASPART 100 UNIT/ML ~~LOC~~ SOLN
0.0000 [IU] | Freq: Three times a day (TID) | SUBCUTANEOUS | Status: DC
  Administered 2020-11-04 (×3): 5 [IU] via SUBCUTANEOUS
  Administered 2020-11-05: 3 [IU] via SUBCUTANEOUS
  Administered 2020-11-05: 5 [IU] via SUBCUTANEOUS
  Administered 2020-11-05: 11 [IU] via SUBCUTANEOUS
  Administered 2020-11-06 (×2): 8 [IU] via SUBCUTANEOUS
  Administered 2020-11-06: 15 [IU] via SUBCUTANEOUS
  Administered 2020-11-07: 3 [IU] via SUBCUTANEOUS
  Administered 2020-11-07: 5 [IU] via SUBCUTANEOUS
  Administered 2020-11-07: 8 [IU] via SUBCUTANEOUS
  Administered 2020-11-08: 3 [IU] via SUBCUTANEOUS

## 2020-11-03 MED ORDER — ONDANSETRON HCL 4 MG/2ML IJ SOLN
4.0000 mg | Freq: Four times a day (QID) | INTRAMUSCULAR | Status: DC | PRN
  Administered 2020-11-07: 4 mg via INTRAVENOUS
  Filled 2020-11-03: qty 2

## 2020-11-03 MED ORDER — IPRATROPIUM-ALBUTEROL 0.5-2.5 (3) MG/3ML IN SOLN
3.0000 mL | Freq: Four times a day (QID) | RESPIRATORY_TRACT | Status: DC
Start: 2020-11-03 — End: 2020-11-06
  Administered 2020-11-03 – 2020-11-05 (×10): 3 mL via RESPIRATORY_TRACT
  Filled 2020-11-03 (×10): qty 3

## 2020-11-03 MED ORDER — DOCUSATE SODIUM 100 MG PO CAPS
100.0000 mg | ORAL_CAPSULE | Freq: Two times a day (BID) | ORAL | Status: DC
  Administered 2020-11-04 – 2020-11-07 (×8): 100 mg via ORAL
  Filled 2020-11-03 (×8): qty 1

## 2020-11-03 MED ORDER — ASPIRIN EC 81 MG PO TBEC
81.0000 mg | DELAYED_RELEASE_TABLET | Freq: Every morning | ORAL | Status: DC
  Administered 2020-11-04 – 2020-11-08 (×5): 81 mg via ORAL
  Filled 2020-11-03 (×5): qty 1

## 2020-11-03 MED ORDER — IPRATROPIUM BROMIDE HFA 17 MCG/ACT IN AERS
2.0000 | INHALATION_SPRAY | Freq: Once | RESPIRATORY_TRACT | Status: AC
  Administered 2020-11-03: 2 via RESPIRATORY_TRACT
  Filled 2020-11-03: qty 12.9

## 2020-11-03 MED ORDER — POLYVINYL ALCOHOL 1.4 % OP SOLN
2.0000 [drp] | Freq: Every day | OPHTHALMIC | Status: DC | PRN
  Filled 2020-11-03: qty 15

## 2020-11-03 MED ORDER — INSULIN GLARGINE 100 UNIT/ML ~~LOC~~ SOLN
25.0000 [IU] | Freq: Every day | SUBCUTANEOUS | Status: DC
  Administered 2020-11-03 – 2020-11-06 (×4): 25 [IU] via SUBCUTANEOUS
  Filled 2020-11-03 (×6): qty 0.25

## 2020-11-03 MED ORDER — FUROSEMIDE 10 MG/ML IJ SOLN
40.0000 mg | Freq: Once | INTRAMUSCULAR | Status: DC
  Filled 2020-11-03: qty 4

## 2020-11-03 MED ORDER — SODIUM CHLORIDE 0.9 % IV SOLN
1.0000 g | Freq: Once | INTRAVENOUS | Status: AC
  Administered 2020-11-03: 1 g via INTRAVENOUS
  Filled 2020-11-03: qty 10

## 2020-11-03 MED ORDER — FUROSEMIDE 10 MG/ML IJ SOLN
40.0000 mg | Freq: Two times a day (BID) | INTRAMUSCULAR | Status: DC
  Administered 2020-11-03 – 2020-11-06 (×7): 40 mg via INTRAVENOUS
  Filled 2020-11-03 (×7): qty 4

## 2020-11-03 MED ORDER — BISACODYL 5 MG PO TBEC
5.0000 mg | DELAYED_RELEASE_TABLET | Freq: Every day | ORAL | Status: DC | PRN
  Administered 2020-11-05: 5 mg via ORAL
  Filled 2020-11-03: qty 1

## 2020-11-03 MED ORDER — INSULIN ASPART 100 UNIT/ML ~~LOC~~ SOLN
0.0000 [IU] | Freq: Every day | SUBCUTANEOUS | Status: DC
  Administered 2020-11-03: 3 [IU] via SUBCUTANEOUS
  Administered 2020-11-04: 2 [IU] via SUBCUTANEOUS
  Administered 2020-11-06: 4 [IU] via SUBCUTANEOUS
  Administered 2020-11-07: 5 [IU] via SUBCUTANEOUS

## 2020-11-03 MED ORDER — OXYCODONE HCL 5 MG PO TABS: 5.0000 mg | ORAL_TABLET | ORAL | Status: DC | PRN

## 2020-11-03 MED ORDER — ACETAMINOPHEN 325 MG PO TABS: 650.0000 mg | ORAL_TABLET | Freq: Four times a day (QID) | ORAL | Status: DC | PRN

## 2020-11-03 MED ORDER — MORPHINE SULFATE (PF) 2 MG/ML IV SOLN: 2.0000 mg | INTRAVENOUS | Status: DC | PRN

## 2020-11-03 MED ORDER — METHYLPREDNISOLONE SODIUM SUCC 125 MG IJ SOLR
125.0000 mg | Freq: Once | INTRAMUSCULAR | Status: AC
  Administered 2020-11-03: 125 mg via INTRAVENOUS
  Filled 2020-11-03: qty 2

## 2020-11-03 MED ORDER — ALBUTEROL SULFATE (2.5 MG/3ML) 0.083% IN NEBU
2.5000 mg | INHALATION_SOLUTION | RESPIRATORY_TRACT | Status: DC | PRN
Start: 2020-11-03 — End: 2020-11-08

## 2020-11-03 MED ORDER — SODIUM CHLORIDE 0.9% FLUSH
3.0000 mL | Freq: Two times a day (BID) | INTRAVENOUS | Status: DC
  Administered 2020-11-03 – 2020-11-08 (×10): 3 mL via INTRAVENOUS

## 2020-11-03 MED ORDER — ACETAMINOPHEN 650 MG RE SUPP: 650.0000 mg | Freq: Four times a day (QID) | RECTAL | Status: DC | PRN

## 2020-11-03 NOTE — Progress Notes (Signed)
  Echocardiogram 2D Echocardiogram has been performed.  [name] 11/03/2020, 6:02 PM

## 2020-11-03 NOTE — ED Provider Notes (Signed)
[facility] EMERGENCY DEPARTMENT Provider Note   CSN: [id number] Arrival date & time: 11/03/20  1003     History Chief Complaint  Patient presents with  . Altered Mental Status  . Urinary Frequency    [name] is a 86 y.o. female with pertinent past medical history of CHF, CKD, COPD, diabetes, hypercholesteremia, hypertension that presents the emergency department today for fall.  Son called EMS this morning after patient was found lying on the floor this morning, patient states that she does not remember falling, however states that when she woke up she was on the floor.  States that she thinks she could have fallen and then fell asleep on the floor.    Has had recent cough and congestion for the past couple of days, has been vaccinated and boosted against Covid.  Patient states that she has been compliant with her Lasix and her COPD medications.  Denies any shortness of breath or chest pain.  Denies any recent weight gain, states that she is at her normal weight.  Denies any fevers or chills.  Denies any nausea or vomiting or abdominal pain.  Is not complaining of headache, neck pain vision changes.  Is not on a blood thinner.  Patient does have frequent history of UTIs, patient's son states that she has been having dysuria for the past couple of days.  Denies any facial droop, numbness, tingling, weakness, aphasia.  Patient is not complaining of pain anywhere.  No other complaints.  Patient normally wears 2 L of oxygen at home.   When speaking to son, primary caretaker an hour after patient presented he states that she did not fall on the floor.  He states that this morning she was going to the bathroom and she went back into her bed and she started sliding off of her bed, was never on the floor.  He states that for the past day or so she has been confused and has not been able to take care of herself properly due to her confusion.  Denies any falls or hitting her head.  Was not found on the floor.    Past Medical History:  Diagnosis Date  . Anasarca 04/2009   found to be secondary to pericardial effusion with tamponade/pericardial window done  . Chronic diastolic CHF (congestive heart failure) ([place])   . CKD (chronic kidney disease), stage III ([place])   . COPD (chronic obstructive pulmonary disease) ([place])   . Coronary artery disease    a. mild-mod CAD 2004.  . Diabetes mellitus    insulin dependent  . Herpes labialis    Healing herpes labialis  . Hypercholesterolemia   . Hypertension   . Lumbar radiculopathy   . Lumbar scoliosis   . Morbid obesity ([place])   . Pericardial effusion 04/2009   a. 2010 with tamponade s/p window.  Marland Kitchen PONV (postoperative nausea and vomiting)    has not had any problems since 2001  . Spondylolisthesis   . Spondylosis     Patient Active Problem List   Diagnosis Date Noted  . Hearing loss of left ear due to cerumen impaction 09/22/2020  . Osteoarthritis 09/22/2020  . Gout 03/31/2020  . Benign hypertension with CKD (chronic kidney disease) stage III ([place]) 09/21/2019  . Obesity (BMI 30-39.9) 09/21/2019  . Acute lower UTI 09/21/2019  . Anemia of chronic renal failure, stage 2 (mild) 02/18/2017  . Diabetic polyneuropathy associated with type 2 diabetes mellitus ([place]) 02/18/2017  . Vitamin D deficiency  02/18/2017  . Pulmonary hypertension ([place])   . CKD (chronic kidney disease) stage 3, GFR 30-59 ml/min ([facility])   . Hypertensive retinopathy of both eyes 09/26/2015  . Pseudophakia of both eyes 09/26/2015  . Diabetes with neurologic complications ([place]) [id number]  . Chronic diastolic CHF (congestive heart failure) ([place]) 03/18/2011  . Mixed dyslipidemia 03/18/2011  . Hypertension, essential     Past Surgical History:  Procedure Laterality Date  . BACK SURGERY     microdiskectomy L3-4 on L/partial facetectomy 3-4 on L/removal of synovial cyst on left L3-4  . BREAST BIOPSY     left  . CARDIAC CATHETERIZATION  04/25/2003    L heart cath w/coronary angiography/R femoral artery/ EF 65%/no mitral regurgitation/ angiography L main coronary artery smooth & normal  . EYE SURGERY     ophthalmoscopy/peritomy adjacent to the limbus from the 8 to 2:30 position superiorly  . lumbar laminectomy    . SUBXYPHOID PERICARDIAL WINDOW  04/2009   for pericardial effusion w/pericardial tamponade/sac drained w/20-French Baard drain/transesophageal echocardiogram confimed complete evacution of pericardial fluid  . TOTAL HIP ARTHROPLASTY  03/06/2012   Procedure: TOTAL HIP ARTHROPLASTY;  Surgeon: Kerin Salen, MD;  Location: [facility];  Service: Orthopedics;  Laterality: Left;  DEPUY/PINNACLE, SUMMIT STEM, CUP POLY & CERAMIC  . VAGINAL HYSTERECTOMY       OB History   No obstetric history on file.     Family History  Problem Relation Age of Onset  . Heart attack Father   . Hypertension Father   . Stroke Mother   . Angina Mother   . Coronary artery disease Other     Social History   Tobacco Use  . Smoking status: Never Smoker  . Smokeless tobacco: Never Used  Substance Use Topics  . Alcohol use: No  . Drug use: No    Home Medications Prior to Admission medications   Medication Sig Start Date End Date Taking? Authorizing Provider  acetaminophen (TYLENOL) 500 MG tablet Take 500 mg by mouth daily as needed for mild pain or headache.   Yes [provider]  allopurinol (ZYLOPRIM) 100 MG tablet Take 2 tablets (200 mg total) by mouth daily. Patient taking differently: Take 100 mg by mouth 2 (two) times daily. 06/28/20  Yes Gregor Hams, MD  ALPRAZolam Duanne Moron) 0.25 MG tablet Take 1 tablet (0.25 mg total) by mouth at bedtime as needed for anxiety. Patient taking differently: Take 0.25 mg by mouth at bedtime. 09/21/20  Yes Hoyt Koch, MD  amLODipine (NORVASC) 5 MG tablet Take 1 tablet (5 mg total) by mouth every morning. 03/06/20  Yes Nahser, Wonda Cheng, MD  aspirin EC 81 MG tablet Take 81 mg by mouth every morning.     Yes [provider]  atorvastatin (LIPITOR) 40 MG tablet Take 40 mg by mouth every morning.  12/31/18  Yes [provider]  diclofenac Sodium (VOLTAREN) 1 % GEL Apply 4 g topically 4 (four) times daily. To affected joint. Patient taking differently: Apply 4 g topically daily as needed (joint pain). 06/27/20  Yes Gregor Hams, MD  ergocalciferol (VITAMIN D2) 50000 UNITS capsule Take 50,000 Units by mouth every Friday.  07/30/11  Yes [provider]  fluticasone (FLONASE) 50 MCG/ACT nasal spray Place 2 sprays into both nostrils daily. 10/15/16  Yes Tat, Shanon Brow, MD  furosemide (LASIX) 80 MG tablet TAKE 1 TABLET BY MOUTH EVERY DAY Patient taking differently: Take 80 mg by mouth daily. 09/11/20  Yes Nahser, Wonda Cheng,  MD  gabapentin (NEURONTIN) 100 MG capsule TAKE 1 CAPSULE BY MOUTH THREE TIMES A DAY Patient taking differently: Take 100 mg by mouth 3 (three) times daily. 08/14/20  Yes Myrlene Broker, MD  hydrALAZINE (APRESOLINE) 25 MG tablet TAKE 1 TABLET BY MOUTH TWICE A DAY Patient taking differently: Take 25 mg by mouth 2 (two) times daily. 04/11/20  Yes Nahser, Deloris Ping, MD  insulin aspart (NOVOLOG) 100 UNIT/ML injection Inject 3-8 Units into the skin 3 (three) times daily with meals. Based on a sliding scale.   Yes [provider]  insulin glargine (LANTUS) 100 UNIT/ML injection Inject 0.25 mLs (25 Units total) into the skin at bedtime. 12/24/19  Yes Jae Dire, MD  LACTASE PO Take 1 tablet by mouth 3 (three) times daily as needed (if meal includes cheese or other dairy).   Yes [provider]  metoprolol tartrate (LOPRESSOR) 25 MG tablet Take 25 mg by mouth 2 (two) times daily.   Yes [provider]  Multiple Vitamin (MULTIVITAMIN WITH MINERALS) TABS tablet Take 1 tablet by mouth daily.   Yes [provider]  nitrofurantoin, macrocrystal-monohydrate, (MACROBID) 100 MG capsule Take 1 capsule (100 mg total) by mouth 2 (two) times  daily. 09/22/20  Yes Myrlene Broker, MD  ONE TOUCH ULTRA TEST test strip 1 each by Other route as directed. 01/13/19  Yes [provider]  oxyCODONE-acetaminophen (PERCOCET/ROXICET) 5-325 MG tablet Take 1 tablet by mouth every 4 (four) hours as needed for severe pain. 09/21/20  Yes Myrlene Broker, MD  OXYGEN Inhale 1.5 Lipoprotein Lipase Releasing Units into the lungs. 1.5L to 2L   Yes [provider]  Polyethyl Glycol-Propyl Glycol 0.4-0.3 % SOLN Apply 2 drops to eye daily as needed (dry eyes).    Yes [provider]  Probiotic Product (PROBIOTIC DAILY PO) Take 1 tablet by mouth daily.   Yes [provider]  RELION INSULIN SYR 0.3ML/31G 31G X 5/16" 0.3 ML MISC 1 each by Other route as needed (insulin). 11/23/19  Yes [provider]    Allergies    Atenolol, Chlorhexidine, Codeine, Lactose intolerance (gi), Lodine [etodolac], Sulfa drugs cross reactors, Sulfamethoxazole, Benzonatate, Escitalopram oxalate, Metformin, Oxycodone-acetaminophen, and Pregabalin  Review of Systems   Review of Systems  Constitutional: Negative for chills, diaphoresis, fatigue and fever.  HENT: Positive for congestion. Negative for sore throat and trouble swallowing.   Eyes: Negative for pain and visual disturbance.  Respiratory: Positive for cough. Negative for shortness of breath and wheezing.   Cardiovascular: Negative for chest pain, palpitations and leg swelling.  Gastrointestinal: Negative for abdominal distention, abdominal pain, diarrhea, nausea and vomiting.  Genitourinary: Negative for difficulty urinating.  Musculoskeletal: Negative for back pain, neck pain and neck stiffness.  Skin: Negative for pallor.  Neurological: Negative for dizziness, speech difficulty, weakness and headaches.  Psychiatric/Behavioral: Negative for confusion.    Physical Exam Updated Vital Signs BP (!) 127/57   Pulse 84   Temp 98.9 F (37.2 C) (Oral)   Resp (!) 26    Ht 5\' 4"  (1.626 m)   Wt 90 kg   SpO2 93%   BMI 34.06 kg/m   Physical Exam Constitutional:      General: She is not in acute distress.    Appearance: Normal appearance. She is not ill-appearing, toxic-appearing or diaphoretic.  HENT:     Head: Normocephalic and atraumatic.     Mouth/Throat:     Mouth: Mucous membranes are moist.     Pharynx:  Oropharynx is clear.  Eyes:     General: No scleral icterus.    Extraocular Movements: Extraocular movements intact.     Pupils: Pupils are equal, round, and reactive to light.  Neck:     Comments: No cervical midline tenderness. Cardiovascular:     Rate and Rhythm: Normal rate and regular rhythm.     Pulses: Normal pulses.     Heart sounds: Normal heart sounds.  Pulmonary:     Effort: Pulmonary effort is normal. Tachypnea present. No respiratory distress.     Breath sounds: No stridor. Wheezing present. No rhonchi or rales.     Comments: Diffuse audible wheezing heard in bilateral lung fields. Chest:     Chest wall: No tenderness.  Abdominal:     General: Abdomen is flat. There is no distension.     Palpations: Abdomen is soft.     Tenderness: There is no abdominal tenderness. There is no guarding or rebound.  Musculoskeletal:        General: No swelling or tenderness. Normal range of motion.     Cervical back: Normal range of motion and neck supple. No rigidity.     Right lower leg: Edema present.     Left lower leg: Edema present.     Comments: Bilateral lower extremity edema, no erythema or warmth.  Normal range of motion to upper and lower extremities.  No midline tenderness of cervical, thoracic or lumbar spine.  Skin:    General: Skin is warm and dry.     Capillary Refill: Capillary refill takes less than 2 seconds.     Coloration: Skin is not pale.  Neurological:     General: No focal deficit present.     Mental Status: She is alert and oriented to person, place, and time.     Comments: Alert. Clear speech. No facial droop.  CNIII-XII grossly intact. Bilateral upper and lower extremities' sensation grossly intact. 5/5 symmetric strength with grip strength and with plantar and dorsi flexion bilaterally.Normal finger to nose bilaterally. Negative pronator drift.  Psychiatric:        Mood and Affect: Mood normal.        Behavior: Behavior normal.     ED Results / Procedures / Treatments   Labs (all labs ordered are listed, but only abnormal results are displayed) Labs Reviewed  CBC WITH DIFFERENTIAL/PLATELET - Abnormal; Notable for the following components:      Result Value   WBC 14.2 (*)    Hemoglobin 11.2 (*)    MCHC 29.3 (*)    Neutro Abs 12.7 (*)    Abs Immature Granulocytes 0.33 (*)    All other components within normal limits  BRAIN NATRIURETIC PEPTIDE - Abnormal; Notable for the following components:   B Natriuretic Peptide 970.6 (*)    All other components within normal limits  COMPREHENSIVE METABOLIC PANEL - Abnormal; Notable for the following components:   Sodium 134 (*)    Potassium 5.9 (*)    Chloride 95 (*)    Glucose, Bld 331 (*)    BUN 47 (*)    Creatinine, Ser 1.78 (*)    Calcium 8.8 (*)    Total Protein 5.9 (*)    AST 55 (*)    ALT 57 (*)    GFR, Estimated 27 (*)    All other components within normal limits  URINALYSIS, ROUTINE W REFLEX MICROSCOPIC - Abnormal; Notable for the following components:   APPearance CLOUDY (*)    Leukocytes,Ua LARGE (*)  All other components within normal limits  URINALYSIS, MICROSCOPIC (REFLEX) - Abnormal; Notable for the following components:   Bacteria, UA FEW (*)    Non Squamous Epithelial PRESENT (*)    All other components within normal limits  I-STAT CHEM 8, ED - Abnormal; Notable for the following components:   Sodium 134 (*)    Potassium 5.7 (*)    Chloride 94 (*)    BUN 52 (*)    Creatinine, Ser 1.70 (*)    Glucose, Bld 302 (*)    TCO2 34 (*)    All other components within normal limits  I-STAT VENOUS BLOOD GAS, ED - Abnormal;  Notable for the following components:   pH, Ven 7.244 (*)    pCO2, Ven 81.9 (*)    pO2, Ven 160.0 (*)    Bicarbonate 35.4 (*)    TCO2 38 (*)    Acid-Base Excess 6.0 (*)    Sodium 133 (*)    Potassium 5.7 (*)    HCT 35.0 (*)    Hemoglobin 11.9 (*)    All other components within normal limits  RESP PANEL BY RT-PCR (FLU A&B, COVID) ARPGX2  URINE CULTURE  CK  AMMONIA  BLOOD GAS, VENOUS    EKG EKG Interpretation  Date/Time:  Friday November 03 2020 10:03:50 EST Ventricular Rate:  80 PR Interval:    QRS Duration: 119 QT Interval:  400 QTC Calculation: 462 R Axis:   -63 Text Interpretation: Sinus arrhythmia Prolonged PR interval Left anterior fascicular block Probable left ventricular hypertrophy No significant change since last tracing Confirmed by Blanchie Dessert [phone]) on 11/03/2020 10:13:41 AM   Radiology CT Head Wo Contrast  Result Date: 11/03/2020 CLINICAL DATA:  Found down on floor. Confusion for 2 days. History of UTI. EXAM: CT HEAD WITHOUT CONTRAST TECHNIQUE: Contiguous axial images were obtained from the base of the skull through the vertex without intravenous contrast. COMPARISON:  09/20/2019 head CT. FINDINGS: Brain: Motion degraded scan, limiting assessment. No evidence of parenchymal hemorrhage or extra-axial fluid collection. No mass lesion, mass effect, or midline shift. No CT evidence of acute infarction. Nonspecific mild to moderate subcortical and periventricular white matter hypodensity, most in keeping with chronic small vessel ischemic change. Cerebral volume is age appropriate. No ventriculomegaly. Vascular: No acute abnormality. Skull: No evidence of calvarial fracture. Sinuses/Orbits: The visualized paranasal sinuses are essentially clear. Other:  The mastoid air cells are unopacified. IMPRESSION: Limited motion degraded scan. No evidence of acute intracranial abnormality. No evidence of calvarial fracture. Mild-to-moderate chronic small vessel ischemic  changes in the cerebral white matter. Electronically Signed   By: Ilona Sorrel M.D.   On: 11/03/2020 11:29   CT Cervical Spine Wo Contrast  Result Date: 11/03/2020 CLINICAL DATA:  Found down at home.  Confusion for 2 days. EXAM: CT CERVICAL SPINE WITHOUT CONTRAST TECHNIQUE: Multidetector CT imaging of the cervical spine was performed without intravenous contrast. Multiplanar CT image reconstructions were also generated. COMPARISON:  09/18/2019 cervical spine CT. FINDINGS: Motion degraded scan, limiting assessment. Alignment: Straightening of the cervical spine. No facet subluxation. Dens is well positioned between the lateral masses of C1. Mild 3 mm anterolisthesis at C5-6, unchanged. Skull base and vertebrae: No acute fracture. Chronic ununited odontoid fracture with new minimal 2 mm distraction of the tip of the dens. No primary bone lesion or focal pathologic process. Soft tissues and spinal canal: No prevertebral edema. No visible canal hematoma. Disc levels: Marked multilevel degenerative disc disease throughout the cervical spine, most prominent at  C5-6. Marked bilateral facet arthropathy. Moderate degenerative foraminal stenosis on the left at C3-4 and C4-5. Upper chest: New 7 mm medial apical left upper lobe ground-glass nodule (series 3/image 87). Other: Visualized mastoid air cells appear clear. No discrete thyroid nodules. No pathologically enlarged cervical nodes. IMPRESSION: 1. Motion degraded scan, limiting assessment. 2. No evidence of acute cervical spine fracture or facet subluxation. 3. Chronic ununited odontoid fracture with new minimal 2 mm distraction of the tip of the dens, which may simply be due to differences in positioning. 4. Marked multilevel degenerative changes in the cervical spine as detailed. 5. New 7 mm medial apical left upper lobe ground-glass nodule. Initial follow-up with CT at 6-12 months is recommended to confirm persistence. If persistent, repeat CT is recommended every  2 years until 5 years of stability has been established. This recommendation follows the consensus statement: Guidelines for Management of Incidental Pulmonary Nodules Detected on CT Images: From the Fleischner Society 2017; Radiology 2017; 284:228-243. Electronically Signed   By: Delbert PhenixJason A Poff M.D.   On: 11/03/2020 11:56   DG Chest Port 1 View  Result Date: 11/03/2020 CLINICAL DATA:  Shortness of breath and confusion. EXAM: PORTABLE CHEST 1 VIEW COMPARISON:  12/17/2019 FINDINGS: Stable mild cardiac enlargement. Bibasilar atelectasis present. No overt edema, pneumothorax or visualized pleural fluid. Stable old left-sided rib fractures. IMPRESSION: Bibasilar atelectasis. Electronically Signed   By: Irish LackGlenn  Yamagata M.D.   On: 11/03/2020 10:39    Procedures .Critical Care Performed by: Farrel GordonPatel, Abner Ardis, PA-C Authorized by: Farrel GordonPatel, Dinh Ayotte, PA-C   Critical care provider statement:    Critical care time (minutes):  45   Critical care was necessary to treat or prevent imminent or life-threatening deterioration of the following conditions:  Respiratory failure   Critical care was time spent personally by me on the following activities:  Discussions with consultants, evaluation of patient's response to treatment, examination of patient, ordering and performing treatments and interventions, ordering and review of laboratory studies, ordering and review of radiographic studies, pulse oximetry, re-evaluation of patient's condition, obtaining history from patient or surrogate and review of old charts   (including critical care time)  Medications Ordered in ED Medications  magnesium sulfate IVPB 2 g 50 mL (2 g Intravenous New Bag/Given 11/03/20 1502)  albuterol (PROVENTIL,VENTOLIN) solution continuous neb (has no administration in time range)  cefTRIAXone (ROCEPHIN) 1 g in sodium chloride 0.9 % 100 mL IVPB (1 g Intravenous New Bag/Given 11/03/20 1521)  albuterol (VENTOLIN HFA) 108 (90 Base) MCG/ACT inhaler 4  puff (4 puffs Inhalation Given 11/03/20 1101)  ipratropium (ATROVENT HFA) inhaler 2 puff (2 puffs Inhalation Given 11/03/20 1102)  ipratropium-albuterol (DUONEB) 0.5-2.5 (3) MG/3ML nebulizer solution 3 mL (3 mLs Nebulization Given 11/03/20 1350)  methylPREDNISolone sodium succinate (SOLU-MEDROL) 125 mg/2 mL injection 125 mg (125 mg Intravenous Given 11/03/20 1459)    ED Course  I have reviewed the triage vital signs and the nursing notes.  Pertinent labs & imaging results that were available during my care of the patient were reviewed by me and considered in my medical decision making (see chart for details).    MDM Rules/Calculators/A&P                          Timi Lawernce KeasH Kaley is a 86 y.o. female with pertinent past medical history of CHF, CKD, COPD, diabetes, hypercholesteremia, hypertension that presents the emergency department today for confusion son reporting confusion, patient with normal neuro exam with me, ANO  x3.  Lung exam with wheezes, patient does appear fluid overloaded.  No tenderness on exam, will obtain CT head and neck due to fall in addition to basic labs at this time.  Albuterol and Atrovent given for diffuse wheezing. IV steroids and mag also given.  CMP today remarkable for sodium of 134, potassium of 5.9, chloride of 95, glucose of 331, elevated creatinine of 1.78.  Baseline around 1.3.  Elevated BUN to 47.  Elevated mild transaminase of AST 55 and ALT 57.  CBC with leukocytosis of 14.2, hemoglobin of 11.2, this is patient's baseline.  BNP elevated to 970, chest x-ray without any edema.  Does show bibasilar atelectasis, no concerns for pneumonia.  Patient is still diffusely wheezing upon reassessment after Atrovent and albuterol, will give DuoNeb after Covid results.  DuoNeb will also help potassium, patient already received 80 mg of Lasix before arriving here.  CT head and Ct cervical spine without any acute process.  Upon reevaluation, patient is becoming very sleepy, is  easy to arouse.  VBG ordered which shows PCO2 of 81.9, no comparison, pH 7.244.  will order BiPAP at this time.  Respiratory therapy called.  Urinalysis did come back to show UTI, will also start Rocephin at this time.  Patient will benefit from admission at this time due to hypercarbia and UTI contributing to confusion and drowsiness, has been evaluated by Dr. Maryan Rued.   Spoke to Dr. Lorin Mercy who will accept the patient.  Still awaiting respiratory therapy to come down for BiPAP.  The patient appears reasonably stabilized for admission considering the current resources, flow, and capabilities available in the ED at this time, and I doubt any other [facility] requiring further screening and/or treatment in the ED prior to admission.  I discussed this case with my attending physician who cosigned this note including patient's presenting symptoms, physical exam, and planned diagnostics and interventions. Attending physician stated agreement with plan or made changes to plan which were implemented.   Attending physician assessed patient at bedside.   Final Clinical Impression(s) / ED Diagnoses Final diagnoses:  Hypercarbia  Acute cystitis with hematuria  Hyperkalemia    Rx / DC Orders ED Discharge Orders    None       Alfredia Client, PA-C 11/03/20 1550    Blanchie Dessert, MD 11/08/20 [phone]

## 2020-11-03 NOTE — ED Notes (Signed)
Echo at bedside

## 2020-11-03 NOTE — ED Triage Notes (Signed)
Patient brought to ED via EMS from home. Son called EMS after finding pt lying in floor this AM; reported pt has been confused x 2 days. Hx UTI, COPD, CHF, DM. Pt alert and oriented x 4 on arrival to ED; bilateral lower edema, audible wheezing, and congested cough presnet. Pt uses wheelchair and 2L [place] at baseline. Pt reports she has not had her insulin this AM.  EMS v/s: 134/70 BP 386 CBG 97% on 4L [place] 1st degree block on EKG

## 2020-11-03 NOTE — H&P (Signed)
History and Physical    [name] [id number] DOB: [birth year] DOA: 11/03/2020  PCP: Hoyt Koch, MD Consultants:  [place] - endocrinology; [place] - cardiology Patient coming from:  Home; NOK: [place], [place], [phone]  Chief Complaint:  AMS  HPI: [name] is a 86 y.o. female with medical history significant of obesity (BMI 34); back pain; HTN; HLD; DM; COPD on 2L [place] O2; CAD; stage 3 CKD; and chronic diastolic CHF presenting with SOB.  Patient was obtunded and unable to answer questions at the time of my evaluation.  I attempted to call her son and was unable to reach him.  HPI per PA Patel:  Son called EMS this morning after patient was found lying on the floor this morning, patient states that she does not remember falling, however states that when she woke up she was on the floor. States that she thinks she could have fallen and then fell asleep on the floor. Has had recent cough and congestion for the past couple of days, has been vaccinated and boosted against Covid. Patient states that she has been compliant with her Lasix and her COPD medications. Denies any shortness of breath or chest pain. Denies any recent weight gain, states that she is at her normal weight. Denies any fevers or chills. Denies any nausea or vomiting or abdominal pain. Is not complaining of headache, neck pain vision changes. Is not on a blood thinner. Patient does have frequent history of UTIs, patient's son states that she has been having dysuria for the past couple of days. Denies any facial droop, numbness, tingling, weakness, aphasia. Patient is not complaining of pain anywhere. No other complaints. Patient normally wears 2 L of oxygen at home.  When speaking to son, primary caretaker an hour after patient presented he states that she did not fall on the floor. He states that this morning she was going to the bathroom and she went back into her bed and she started sliding off of her bed, was  never on the floor. He states that for the past day or so she has been confused and has not been able to take care of herself properly due to her confusion. Denies any falls or hitting her head. Was not found on the floor.   ED Course:  Hypercarbia and UTI with confusion.  Slid off bed and her son caught her.  PCO2 81, retaining from COPD.  Also with ?sinusitis.  Given Duoneb.  K+ 5.9, given Albuterol, Lasix.  COVID negative.  BNP 970, looks fluid overloaded but CXR clear.  No weight gain.  Creatinine 1.78, up from 1.3.  Given Rocephin for UTI.  Review of Systems: Unable to perform   Ambulatory Status:  Non-ambulatory, uses wheelchair  COVID Vaccine Status:  Complete plus booster  Past Medical History:  Diagnosis Date  . Anasarca 04/2009   found to be secondary to pericardial effusion with tamponade/pericardial window done  . Chronic diastolic CHF (congestive heart failure) ([place])   . CKD (chronic kidney disease), stage III ([place])   . COPD (chronic obstructive pulmonary disease) ([place])   . Coronary artery disease    a. mild-mod CAD 2004.  . Diabetes mellitus    insulin dependent  . Herpes labialis    Healing herpes labialis  . Hypercholesterolemia   . Hypertension   . Lumbar radiculopathy   . Lumbar scoliosis   . Morbid obesity ([place])   . Pericardial effusion 04/2009   a. 2010 with tamponade s/p  window.  Marland Kitchen PONV (postoperative nausea and vomiting)    has not had any problems since 2001  . Spondylolisthesis   . Spondylosis     Past Surgical History:  Procedure Laterality Date  . BACK SURGERY     microdiskectomy L3-4 on L/partial facetectomy 3-4 on L/removal of synovial cyst on left L3-4  . BREAST BIOPSY     left  . CARDIAC CATHETERIZATION  04/25/2003   L heart cath w/coronary angiography/R femoral artery/ EF 65%/no mitral regurgitation/ angiography L main coronary artery smooth & normal  . EYE SURGERY     ophthalmoscopy/peritomy adjacent to the limbus from the 8 to 2:30  position superiorly  . lumbar laminectomy    . SUBXYPHOID PERICARDIAL WINDOW  04/2009   for pericardial effusion w/pericardial tamponade/sac drained w/20-French Baard drain/transesophageal echocardiogram confimed complete evacution of pericardial fluid  . TOTAL HIP ARTHROPLASTY  03/06/2012   Procedure: TOTAL HIP ARTHROPLASTY;  Surgeon: Kerin Salen, MD;  Location: [facility];  Service: Orthopedics;  Laterality: Left;  DEPUY/PINNACLE, SUMMIT STEM, CUP POLY & CERAMIC  . VAGINAL HYSTERECTOMY      Social History   Socioeconomic History  . Marital status: Widowed    Spouse name: Not on file  . Number of children: Not on file  . Years of education: Not on file  . Highest education level: Not on file  Occupational History  . Not on file  Tobacco Use  . Smoking status: Never Smoker  . Smokeless tobacco: Never Used  Substance and Sexual Activity  . Alcohol use: No  . Drug use: No  . Sexual activity: Not Currently  Other Topics Concern  . Not on file  Social History Narrative  . Not on file   Social Determinants of Health   Financial Resource Strain: Not on file  Food Insecurity: Not on file  Transportation Needs: Not on file  Physical Activity: Not on file  Stress: Not on file  Social Connections: Not on file  Intimate Partner Violence: Not on file    Allergies  Allergen Reactions  . Atenolol Other (See Comments)    weakness  . Chlorhexidine   . Codeine Nausea And Vomiting    Hydrocodone is ok  . Lactose Intolerance (Gi) Diarrhea  . Lodine [Etodolac] Other (See Comments)    dizziness  . Sulfa Drugs Cross Reactors Nausea Only  . Sulfamethoxazole Nausea And Vomiting  . Benzonatate Nausea And Vomiting  . Escitalopram Oxalate Other (See Comments)    mild hallucinations  . Metformin Nausea And Vomiting    Reaction to IR and XL  . Oxycodone-Acetaminophen Other (See Comments)    Percocet does not relieve pts' pain  . Pregabalin Other (See Comments)    Unknown reaction     Family History  Problem Relation Age of Onset  . Heart attack Father   . Hypertension Father   . Stroke Mother   . Angina Mother   . Coronary artery disease Other     Prior to Admission medications   Medication Sig Start Date End Date Taking? Authorizing Provider  acetaminophen (TYLENOL) 500 MG tablet Take 500 mg by mouth daily as needed for mild pain or headache.   Yes [provider]  allopurinol (ZYLOPRIM) 100 MG tablet Take 2 tablets (200 mg total) by mouth daily. Patient taking differently: Take 100 mg by mouth 2 (two) times daily. 06/28/20  Yes Gregor Hams, MD  ALPRAZolam Duanne Moron) 0.25 MG tablet Take 1 tablet (0.25 mg total) by mouth  at bedtime as needed for anxiety. Patient taking differently: Take 0.25 mg by mouth at bedtime. 09/21/20  Yes Hoyt Koch, MD  amLODipine (NORVASC) 5 MG tablet Take 1 tablet (5 mg total) by mouth every morning. 03/06/20  Yes Nahser, Wonda Cheng, MD  aspirin EC 81 MG tablet Take 81 mg by mouth every morning.    Yes [provider]  atorvastatin (LIPITOR) 40 MG tablet Take 40 mg by mouth every morning.  12/31/18  Yes [provider]  diclofenac Sodium (VOLTAREN) 1 % GEL Apply 4 g topically 4 (four) times daily. To affected joint. Patient taking differently: Apply 4 g topically daily as needed (joint pain). 06/27/20  Yes Gregor Hams, MD  ergocalciferol (VITAMIN D2) 50000 UNITS capsule Take 50,000 Units by mouth every Friday.  07/30/11  Yes [provider]  fluticasone (FLONASE) 50 MCG/ACT nasal spray Place 2 sprays into both nostrils daily. 10/15/16  Yes Tat, Shanon Brow, MD  furosemide (LASIX) 80 MG tablet TAKE 1 TABLET BY MOUTH EVERY DAY Patient taking differently: Take 80 mg by mouth daily. 09/11/20  Yes Nahser, Wonda Cheng, MD  gabapentin (NEURONTIN) 100 MG capsule TAKE 1 CAPSULE BY MOUTH THREE TIMES A DAY Patient taking differently: Take 100 mg by mouth 3 (three) times daily. 08/14/20  Yes Hoyt Koch, MD   hydrALAZINE (APRESOLINE) 25 MG tablet TAKE 1 TABLET BY MOUTH TWICE A DAY Patient taking differently: Take 25 mg by mouth 2 (two) times daily. 04/11/20  Yes Nahser, Wonda Cheng, MD  insulin aspart (NOVOLOG) 100 UNIT/ML injection Inject 3-8 Units into the skin 3 (three) times daily with meals. Based on a sliding scale.   Yes [provider]  insulin glargine (LANTUS) 100 UNIT/ML injection Inject 0.25 mLs (25 Units total) into the skin at bedtime. 12/24/19  Yes Harold Hedge, MD  LACTASE PO Take 1 tablet by mouth 3 (three) times daily as needed (if meal includes cheese or other dairy).   Yes [provider]  metoprolol tartrate (LOPRESSOR) 25 MG tablet Take 25 mg by mouth 2 (two) times daily.   Yes [provider]  Multiple Vitamin (MULTIVITAMIN WITH MINERALS) TABS tablet Take 1 tablet by mouth daily.   Yes [provider]  nitrofurantoin, macrocrystal-monohydrate, (MACROBID) 100 MG capsule Take 1 capsule (100 mg total) by mouth 2 (two) times daily. 09/22/20  Yes Hoyt Koch, MD  ONE TOUCH ULTRA TEST test strip 1 each by Other route as directed. 01/13/19  Yes [provider]  oxyCODONE-acetaminophen (PERCOCET/ROXICET) 5-325 MG tablet Take 1 tablet by mouth every 4 (four) hours as needed for severe pain. 09/21/20  Yes Hoyt Koch, MD  OXYGEN Inhale 1.5 Lipoprotein Lipase Releasing Units into the lungs. 1.5L to 2L   Yes [provider]  Polyethyl Glycol-Propyl Glycol 0.4-0.3 % SOLN Apply 2 drops to eye daily as needed (dry eyes).    Yes [provider]  Probiotic Product (PROBIOTIC DAILY PO) Take 1 tablet by mouth daily.   Yes [provider]  RELION INSULIN SYR 0.3ML/31G 31G X 5/16" 0.3 ML MISC 1 each by Other route as needed (insulin). 11/23/19  Yes [provider]    Physical Exam: Vitals:   11/03/20 1445 11/03/20 1500 11/03/20 1515 11/03/20 1558  BP: (!) 116/57 (!) 110/47 (!) 127/57   Pulse: 64 61 84  (!) 59  Resp: 17 17 (!) 26 15  Temp:      TempSrc:      SpO2: 94%  94% 93% 96%  Weight:      Height:         . General:  Appears calm and comfortable and is in NAD; obtunded, responds only to pain despite BIPAP . Eyes:   normal lids, minimally open after sternal rub . ENT:  grossly normal lips; BIPAP in place . Neck:  no LAD, masses or thyromegaly . Cardiovascular:  RRR, no m/r/g.  . Respiratory:   CTA bilaterally with no wheezes/rales/rhonchi.  Normal respiratory effort on BIPAP. Marland Kitchen Abdomen:  soft, NT, ND . Skin:  no rash or induration seen on limited exam . Musculoskeletal:  2+ LE edema, no bony abnormality . Lower extremity:   Limited foot exam with no ulcerations.  2+ distal pulses. Marland Kitchen Psychiatric:  Obtunded, responsive to pain . Neurologic:  Unable to perform    Radiological Exams on Admission: Independently reviewed - see discussion in A/P where applicable  CT Head Wo Contrast  Result Date: 11/03/2020 CLINICAL DATA:  Found down on floor. Confusion for 2 days. History of UTI. EXAM: CT HEAD WITHOUT CONTRAST TECHNIQUE: Contiguous axial images were obtained from the base of the skull through the vertex without intravenous contrast. COMPARISON:  09/20/2019 head CT. FINDINGS: Brain: Motion degraded scan, limiting assessment. No evidence of parenchymal hemorrhage or extra-axial fluid collection. No mass lesion, mass effect, or midline shift. No CT evidence of acute infarction. Nonspecific mild to moderate subcortical and periventricular white matter hypodensity, most in keeping with chronic small vessel ischemic change. Cerebral volume is age appropriate. No ventriculomegaly. Vascular: No acute abnormality. Skull: No evidence of calvarial fracture. Sinuses/Orbits: The visualized paranasal sinuses are essentially clear. Other:  The mastoid air cells are unopacified. IMPRESSION: Limited motion degraded scan. No evidence of acute intracranial abnormality. No evidence of calvarial fracture.  Mild-to-moderate chronic small vessel ischemic changes in the cerebral white matter. Electronically Signed   By: Ilona Sorrel M.D.   On: 11/03/2020 11:29   CT Cervical Spine Wo Contrast  Result Date: 11/03/2020 CLINICAL DATA:  Found down at home.  Confusion for 2 days. EXAM: CT CERVICAL SPINE WITHOUT CONTRAST TECHNIQUE: Multidetector CT imaging of the cervical spine was performed without intravenous contrast. Multiplanar CT image reconstructions were also generated. COMPARISON:  09/18/2019 cervical spine CT. FINDINGS: Motion degraded scan, limiting assessment. Alignment: Straightening of the cervical spine. No facet subluxation. Dens is well positioned between the lateral masses of C1. Mild 3 mm anterolisthesis at C5-6, unchanged. Skull base and vertebrae: No acute fracture. Chronic ununited odontoid fracture with new minimal 2 mm distraction of the tip of the dens. No primary bone lesion or focal pathologic process. Soft tissues and spinal canal: No prevertebral edema. No visible canal hematoma. Disc levels: Marked multilevel degenerative disc disease throughout the cervical spine, most prominent at C5-6. Marked bilateral facet arthropathy. Moderate degenerative foraminal stenosis on the left at C3-4 and C4-5. Upper chest: New 7 mm medial apical left upper lobe ground-glass nodule (series 3/image 87). Other: Visualized mastoid air cells appear clear. No discrete thyroid nodules. No pathologically enlarged cervical nodes. IMPRESSION: 1. Motion degraded scan, limiting assessment. 2. No evidence of acute cervical spine fracture or facet subluxation. 3. Chronic ununited odontoid fracture with new minimal 2 mm distraction of the tip of the dens, which may simply be due to differences in positioning. 4. Marked multilevel degenerative changes in the cervical spine as detailed. 5. New 7 mm medial apical left upper lobe ground-glass nodule. Initial follow-up with CT at 6-12 months is recommended to confirm  persistence.  If persistent, repeat CT is recommended every 2 years until 5 years of stability has been established. This recommendation follows the consensus statement: Guidelines for Management of Incidental Pulmonary Nodules Detected on CT Images: From the Fleischner Society 2017; Radiology 2017; 284:228-243. Electronically Signed   By: Ilona Sorrel M.D.   On: 11/03/2020 11:56   DG Chest Port 1 View  Result Date: 11/03/2020 CLINICAL DATA:  Shortness of breath and confusion. EXAM: PORTABLE CHEST 1 VIEW COMPARISON:  12/17/2019 FINDINGS: Stable mild cardiac enlargement. Bibasilar atelectasis present. No overt edema, pneumothorax or visualized pleural fluid. Stable old left-sided rib fractures. IMPRESSION: Bibasilar atelectasis. Electronically Signed   By: Aletta Edouard M.D.   On: 11/03/2020 10:39    EKG: Independently reviewed.  NSR with rate 80, first degree AV block; LVH with no evidence of acute ischemia   Labs on Admission: I have personally reviewed the available labs and imaging studies at the time of the admission.  Pertinent labs:   K+ 5.9 -> 5.7 Glucose 331 BUN 47/Creatinine 1.78/GFR 27; 36/1.38/34 on 11/18 AST 55/ALT 57 BNP 970.6; 531.2 on 2/5 CK 64 WBC 14.2 Hgb 11.2 COVID/flu negative ABG: 7.244/81.9/160.0/35.4 UA: large LE, few bacteria, +epithelial cells H/u pansensitive E coli UTI in 12/2019   Assessment/Plan Principal Problem:   Acute on chronic respiratory failure with hypercapnia (HCC) Active Problems:   Hypertension, essential   Mixed dyslipidemia   Diabetes with neurologic complications (HCC)   Decompensated COPD (chronic obstructive pulmonary disease) (HCC)   Acute on chronic diastolic CHF (congestive heart failure) (HCC)   CKD (chronic kidney disease) stage 3, GFR 30-59 ml/min (HCC)   Gout   Hypercarbic respiratory failure with h/o COPD, acute CHF -Patient with known h/o chronic diastolic CHF and COPD presenting with AMS -pCO2 elevated at 82 on VBG -CXR consistent  with mild bibasilar pulmonary edema -Elevated BNP  -With elevated BNP and abnl CXR, acute decompensated CHF seems probable as diagnosis -Will admit, as per the Emergency HF Mortality Risk Grade.  The patient has: pulmonary edema requiring new O2 therapy; AKI on CKD with >25% decrease in GFR). -Will request echocardiogram; prior was in 12/2019 with preserved EF and grade 1 diastolic dysfunction -Will continue ASA -ACE/ARB, beta blocker, and spironolactone are recommended as per guideline-directed medical therapy to reduce morbidity/mortality; consider addition of spironolactone when more clinically stable -CHF order set utilized; may need CHF team consult but will hold until Echo results are available -Took home Lasix just prior to arrival and will repeat with 40 mg IV BID -Continue [place] O2 for now  AMS -Likely associated with hypercarbia -With elevated pCO2, she was started on BIPAP -Recheck ABG, although since she has been DNR on 2 previous occasions it appears that only BIPAP modifications would be used at this time -Hold all sedating medications including Xanax, Neurontin, Percocet  COPD, ?exacerbation -Patient with baseline COPD, on 2L home O2 -She does not appear to be in acute exacerbation but has marked hypercarbia -No steroids or antibiotics for now -Standing Duonebs and prn Albuterol -On BIPAP  H/o UTI -Previously + on 11/18 and treated with Macrobid -Current UA is not overly suspicious for UTI and appears to be more consistent with asymptomatic bacteriuria -Has received one dose of Rocephin but will hold additional abx for now -Urine culture is pending  AKI on Stage 3 CKD -Stage 3b CKD at baseline, currently about 25% worse than baseline -Will follow with diuresis  DM -Will check A1c -Continue Lantus at home dose  despite NPO status given significant hyperglycemia -Cover with moderate-scale SSI  HTN -Hold Norvasc, hydralazine, Lopressor for now  HLD -Continue  Lipitor  Gout -Recent flare with allopurinol adjustment -Uric acid goal <6, recently 5.2  Obesity -Body mass index is 34.06 kg/m..  -Weight loss should be encouraged -Outpatient PCP/bariatric medicine f/u encouraged     Note: This patient has been tested and is negative for the novel coronavirus COVID-19. The patient has been fully vaccinated against COVID-19.    DVT prophylaxis: Lovenox  Code Status:  DNR - confirmed with last 2 admissions Family Communication: None present; I attempted to speak with her son by telephone but was unable to reach him at the time of admission Disposition Plan:  The patient is from: home  Anticipated d/c is to: be determined  Anticipated d/c date will depend on clinical response to treatment, likely 2-4 days  Patient is currently: acutely ill Consults called: TOC team; PT/OT/Nutrition Admission status: Admit - It is my clinical opinion that admission to INPATIENT is reasonable and necessary because this patient will require at least 2 midnights in the hospital to treat this condition based on the medical complexity of the problems presented.  Given the aforementioned information, the predictability of an adverse outcome is felt to be significant.  Jonah Blue MD [facility]   How to contact the [facility] Attending or Consulting provider 7A - 7P or covering provider during after hours 7P -7A, for this patient?  1. Check the care team in [facility] and look for a) attending/consulting [facility] provider listed and b) the [facility] team listed 2. Log into [email or web address] and use [place]'s universal password to access. If you do not have the password, please contact the hospital operator. 3. Locate the [facility] provider you are looking for under [facility] and page to a number that you can be directly reached. 4. If you still have difficulty reaching the provider, please page the [facility] (Director on Call) for the Hospitalists listed on amion for  assistance.   11/03/2020, 4:59 PM

## 2020-11-03 NOTE — Progress Notes (Signed)
Critical ABG results were given to attending, Dr. Ophelia Charter.

## 2020-11-03 NOTE — ED Notes (Signed)
RT at bedside to place pt on bipap

## 2020-11-04 DIAGNOSIS — J9622 Acute and chronic respiratory failure with hypercapnia: Secondary | ICD-10-CM | POA: Diagnosis not present

## 2020-11-04 LAB — GLUCOSE, CAPILLARY
Glucose-Capillary: 244 mg/dL — ABNORMAL HIGH (ref 70–99)
Glucose-Capillary: 213 mg/dL — ABNORMAL HIGH (ref 70–99)
Glucose-Capillary: 243 mg/dL — ABNORMAL HIGH (ref 70–99)
Glucose-Capillary: 244 mg/dL — ABNORMAL HIGH (ref 70–99)

## 2020-11-04 LAB — BLOOD GAS, ARTERIAL
Acid-Base Excess: 4.8 mmol/L — ABNORMAL HIGH (ref 0.0–2.0)
Bicarbonate: 28.7 mmol/L — ABNORMAL HIGH (ref 20.0–28.0)
FIO2: 60
O2 Saturation: 99.5 %
Patient temperature: 37
pCO2 arterial: 42.4 mmHg (ref 32.0–48.0)
pH, Arterial: 7.446 (ref 7.350–7.450)
pO2, Arterial: 226 mmHg — ABNORMAL HIGH (ref 83.0–108.0)

## 2020-11-04 LAB — BASIC METABOLIC PANEL
Anion gap: 11 (ref 5–15)
BUN: 58 mg/dL — ABNORMAL HIGH (ref 8–23)
CO2: 29 mmol/L (ref 22–32)
Calcium: 8.9 mg/dL (ref 8.9–10.3)
Chloride: 97 mmol/L — ABNORMAL LOW (ref 98–111)
Creatinine, Ser: 2.04 mg/dL — ABNORMAL HIGH (ref 0.44–1.00)
GFR, Estimated: 23 mL/min — ABNORMAL LOW (ref >60)
Glucose, Bld: 285 mg/dL — ABNORMAL HIGH (ref 70–99)
Potassium: 5.4 mmol/L — ABNORMAL HIGH (ref 3.5–5.1)
Sodium: 137 mmol/L (ref 135–145)

## 2020-11-04 LAB — CBC
HCT: 32.8 % — ABNORMAL LOW (ref 36.0–46.0)
Hemoglobin: 10.1 g/dL — ABNORMAL LOW (ref 12.0–15.0)
MCH: 28.5 pg (ref 26.0–34.0)
MCHC: 30.8 g/dL (ref 30.0–36.0)
MCV: 92.7 fL (ref 80.0–100.0)
Platelets: 179 10*3/uL (ref 150–400)
RBC: 3.54 MIL/uL — ABNORMAL LOW (ref 3.87–5.11)
RDW: 14.6 % (ref 11.5–15.5)
WBC: 8.2 10*3/uL (ref 4.0–10.5)
nRBC: 0.2 % (ref 0.0–0.2)

## 2020-11-04 LAB — TSH: TSH: 0.259 u[IU]/mL — ABNORMAL LOW (ref 0.350–4.500)

## 2020-11-04 LAB — HEMOGLOBIN A1C
Hgb A1c MFr Bld: 7.9 % — ABNORMAL HIGH (ref 4.8–5.6)
Mean Plasma Glucose: 180.03 mg/dL

## 2020-11-04 LAB — T4, FREE: Free T4: 0.82 ng/dL (ref 0.61–1.12)

## 2020-11-04 MED ORDER — SODIUM CHLORIDE 0.9 % IV SOLN
1.0000 g | Freq: Once | INTRAVENOUS | Status: AC
  Administered 2020-11-04: 1 g via INTRAVENOUS
  Filled 2020-11-04: qty 1

## 2020-11-04 MED ORDER — AZITHROMYCIN 250 MG PO TABS
500.0000 mg | ORAL_TABLET | Freq: Every day | ORAL | Status: AC
  Administered 2020-11-04: 500 mg via ORAL
  Filled 2020-11-04: qty 2

## 2020-11-04 MED ORDER — ALLOPURINOL 100 MG PO TABS
100.0000 mg | ORAL_TABLET | Freq: Every day | ORAL | Status: DC
  Administered 2020-11-05 – 2020-11-08 (×4): 100 mg via ORAL
  Filled 2020-11-04 (×4): qty 1

## 2020-11-04 MED ORDER — AZITHROMYCIN 250 MG PO TABS
250.0000 mg | ORAL_TABLET | Freq: Every day | ORAL | Status: AC
  Administered 2020-11-05 – 2020-11-08 (×4): 250 mg via ORAL
  Filled 2020-11-04 (×4): qty 1

## 2020-11-04 NOTE — Progress Notes (Addendum)
PROGRESS NOTE  [name] RWE:[id number] DOB: Mar 20, 1931 DOA: 11/03/2020 PCP: Myrlene Broker, MD  HPI/Recap of past 24 hours:  [name] is a 87 y.o. female with medical history significant of obesity (BMI 34); back pain; HTN; HLD; DM; COPD on 2L [place] O2; CAD; stage 3 CKD; and chronic diastolic CHF presenting with SOB.  -Addition to the ED she was found to be severely hypercarbic with PCO2 in the 80s and pH 7.2.  She was started on BiPAP and continued overnight with resolution of her hypercarbia with PCO2 in the 40s and a pH of 7.4.  She was taken off the BiPAP this morning and placed on 2 L nasal cannula.  11/04/20: Seen and examined at her bedside.  She has no new complaints.  Called her son Genevie Cheshire, spoke with him via phone and gave updates.  He reports that she has had recurrent similar episodes in the past.  No prior evaluation to rule out sleep apnea    Assessment/Plan: Principal Problem:   Acute on chronic respiratory failure with hypercapnia (HCC) Active Problems:   Hypertension, essential   Mixed dyslipidemia   Diabetes with neurologic complications (HCC)   Decompensated COPD (chronic obstructive pulmonary disease) (HCC)   Acute on chronic diastolic CHF (congestive heart failure) (HCC)   CKD (chronic kidney disease) stage 3, GFR 30-59 ml/min (HCC)   Gout  Acute on chronic hypoxic hypercarbic respiratory failure likely secondary to CO2 retention with suspected undiagnosed OSA. Presented severely hypercarbic with PCO2 in the 80s and pH in the 7.2 Was placed on continuous BiPAP with resolution of her hypercarbia. Patient has not been previously evaluated for OSA Will likely need to follow-up with pulmonology outpatient post hospitalization. Continue BiPAP as needed, at night Maintain O2 saturation greater than 88%  Acute toxic metabolic encephalopathy secondary to severe hypercarbia and Gram negative Rods UTI Appears to be back to her baseline mentation after underlying  condition was treated  AKI on CKD 3B suspect prerenal in the setting of dehydration with poor oral intake Baseline creatinine appears to be 1.3 with GFR of 34 Presented with creatinine of 1.78 with GFR 27 Creatinine is uptrending 2.04 with GFR of 23. Urine output has not been recorded Monitor urine output Avoid nephrotoxins Repeat renal panel in the morning  Gram negative rods UTI Follow urine culture and sensitivities to narrow down antibiotics Covered with Rocephin empirically  Acute bronchitis Start Z-Pak Monitor  Hyperkalemia in the setting of renal insufficiency Presented with serum potassium of 5.7 She received IV Lasix, currently on IV Lasix 40 mg twice daily Continue to monitor Repeat renal panel in the morning  Acute on chronic diastolic CHF Presented with elevated BNP over 900 2D echo done on 11/03/2020 showed normal LVEF 60 to 65% with grade 1 diastolic dysfunction She is currently on IV Lasix 40 mg twice daily Start strict I's and O's and daily weight  Gout Stable Continue home allopurinol  Physical debility PT OT assessment recommended home health PT OT Continue PT OT with assistance and fall precautions.  Code Status: DNR.  Family Communication: Updated her son Genevie Cheshire via phone on 11/04/2020.Marland Kitchen  Disposition Plan: Likely will DC to home on 11/05/2020.   Consultants:  None.  Procedures:  2D echo.  Antimicrobials:  None.  DVT prophylaxis: Subcu Lovenox daily.  Status is: Inpatient    Dispo:  Patient From: Home  Planned Disposition: Home  Expected discharge date: 11/07/2020  Medically stable for discharge: No, ongoing management of  acute on chronic diastolic CHF, acute on chronic hypercarbic hypoxic respiratory failure.         Objective: Vitals:   11/04/20 0805 11/04/20 0849 11/04/20 1100 11/04/20 1202  BP: 135/62   (!) 119/45  Pulse: (!) 57 62  68  Resp:    18  Temp:    98 F (36.7 C)  TempSrc:      SpO2:  100% 93% 96%   Weight:      Height:        Intake/Output Summary (Last 24 hours) at 11/04/2020 1256 Last data filed at 11/04/2020 0400 Gross per 24 hour  Intake 0 ml  Output --  Net 0 ml   Filed Weights   11/03/20 1014 11/04/20 0457  Weight: 90 kg 91 kg    Exam:  . General: 87 y.o. year-old female well developed well nourished in no acute distress.  Alert and oriented x3. . Cardiovascular: Regular rate and rhythm with no rubs or gallops.  No thyromegaly or JVD noted.   Marland Kitchen Respiratory: Mild rales at bases.  No wheezing noted.  Poor inspiratory effort.   . Abdomen: Soft nontender nondistended with normal bowel sounds x4 quadrants. . Musculoskeletal: No lower extremity edema. 2/4 pulses in all 4 extremities. . Skin: No ulcerative lesions noted or rashes. . Psychiatry: Mood is appropriate for condition and setting   Data Reviewed: CBC: Recent Labs  Lab 11/03/20 1019 11/03/20 1243 11/03/20 1248 11/03/20 1835 11/04/20 0132  WBC 14.2*  --   --   --  8.2  NEUTROABS 12.7*  --   --   --   --   HGB 11.2* 12.2 11.9* 12.2 10.1*  HCT 38.2 36.0 35.0* 36.0 32.8*  MCV 96.0  --   --   --  92.7  PLT 217  --   --   --  179   Basic Metabolic Panel: Recent Labs  Lab 11/03/20 1019 11/03/20 1243 11/03/20 1248 11/03/20 1835 11/04/20 0132  NA 134* 134* 133* 133* 137  K 5.9* 5.7* 5.7* 5.2* 5.4*  CL 95* 94*  --   --  97*  CO2 27  --   --   --  29  GLUCOSE 331* 302*  --   --  285*  BUN 47* 52*  --   --  58*  CREATININE 1.78* 1.70*  --   --  2.04*  CALCIUM 8.8*  --   --   --  8.9   GFR: Estimated Creatinine Clearance: 21.2 mL/min (A) (by C-G formula based on SCr of 2.04 mg/dL (H)). Liver Function Tests: Recent Labs  Lab 11/03/20 1019  AST 55*  ALT 57*  ALKPHOS 77  BILITOT 0.6  PROT 5.9*  ALBUMIN 3.5   No results for input(s): LIPASE, AMYLASE in the last 168 hours. Recent Labs  Lab 11/03/20 1100  AMMONIA 28   Coagulation Profile: No results for input(s): INR, PROTIME in the last 168  hours. Cardiac Enzymes: Recent Labs  Lab 11/03/20 1019  CKTOTAL 64   BNP (last 3 results) No results for input(s): PROBNP in the last 8760 hours. HbA1C: Recent Labs    11/04/20 0132  HGBA1C 7.9*   CBG: Recent Labs  Lab 11/03/20 1828 11/03/20 2058 11/04/20 0802 11/04/20 1158  GLUCAP 274* 286* 244* 213*   Lipid Profile: No results for input(s): CHOL, HDL, LDLCALC, TRIG, CHOLHDL, LDLDIRECT in the last 72 hours. Thyroid Function Tests: Recent Labs    11/04/20 0132  TSH 0.259*  Anemia Panel: No results for input(s): VITAMINB12, FOLATE, FERRITIN, TIBC, IRON, RETICCTPCT in the last 72 hours. Urine analysis:    Component Value Date/Time   COLORURINE YELLOW 11/03/2020 1317   APPEARANCEUR CLOUDY (A) 11/03/2020 1317   APPEARANCEUR Clear 05/23/2020 1627   LABSPEC 1.025 11/03/2020 1317   PHURINE 5.5 11/03/2020 1317   GLUCOSEU NEGATIVE 11/03/2020 1317   GLUCOSEU NEGATIVE 09/21/2020 1622   HGBUR NEGATIVE 11/03/2020 1317   [place] 11/03/2020 1317   BILIRUBINUR Negative 05/23/2020 [place] 11/03/2020 1317   PROTEINUR NEGATIVE 11/03/2020 1317   UROBILINOGEN 0.2 09/21/2020 1622   NITRITE NEGATIVE 11/03/2020 1317   LEUKOCYTESUR LARGE (A) 11/03/2020 1317   Sepsis Labs: @LABRCNTIP (procalcitonin:4,lacticidven:4)  ) Recent Results (from the past 240 hour(s))  Resp Panel by RT-PCR (Flu A&B, Covid) Nasopharyngeal Swab     Status: None   Collection Time: 11/03/20 10:26 AM   Specimen: Nasopharyngeal Swab; Nasopharyngeal(NP) swabs in vial transport medium  Result Value Ref Range Status   SARS Coronavirus 2 by RT PCR NEGATIVE NEGATIVE Final    Comment: (NOTE) SARS-CoV-2 target nucleic acids are NOT DETECTED.  The SARS-CoV-2 RNA is generally detectable in upper respiratory specimens during the acute phase of infection. The lowest concentration of SARS-CoV-2 viral copies this assay can detect is 138 copies/mL. A negative result does not preclude  SARS-Cov-2 infection and should not be used as the sole basis for treatment or other patient management decisions. A negative result may occur with  improper specimen collection/handling, submission of specimen other than nasopharyngeal swab, presence of viral mutation(s) within the areas targeted by this assay, and inadequate number of viral copies(<138 copies/mL). A negative result must be combined with clinical observations, patient history, and epidemiological information. The expected result is Negative.  Fact Sheet for Patients:  EntrepreneurPulse.com.au  Fact Sheet for Healthcare Providers:  IncredibleEmployment.be  This test is no t yet approved or cleared by the [place] FDA and  has been authorized for detection and/or diagnosis of SARS-CoV-2 by FDA under an Emergency Use Authorization (EUA). This EUA will remain  in effect (meaning this test can be used) for the duration of the COVID-19 declaration under Section 564(b)(1) of the Act, 21 U.S.C.section 360bbb-3(b)(1), unless the authorization is terminated  or revoked sooner.       Influenza A by PCR NEGATIVE NEGATIVE Final   Influenza B by PCR NEGATIVE NEGATIVE Final    Comment: (NOTE) The Xpert Xpress SARS-CoV-2/FLU/RSV plus assay is intended as an aid in the diagnosis of influenza from Nasopharyngeal swab specimens and should not be used as a sole basis for treatment. Nasal washings and aspirates are unacceptable for Xpert Xpress SARS-CoV-2/FLU/RSV testing.  Fact Sheet for Patients: EntrepreneurPulse.com.au  Fact Sheet for Healthcare Providers: IncredibleEmployment.be  This test is not yet approved or cleared by the [place] FDA and has been authorized for detection and/or diagnosis of SARS-CoV-2 by FDA under an Emergency Use Authorization (EUA). This EUA will remain in effect (meaning this test can be used) for the duration of  the COVID-19 declaration under Section 564(b)(1) of the Act, 21 U.S.C. section 360bbb-3(b)(1), unless the authorization is terminated or revoked.  Performed at [facility], [place] [address], [place], [place] [address]   Urine culture     Status: Abnormal (Preliminary result)   Collection Time: 11/03/20  1:17 PM   Specimen: Urine, Catheterized  Result Value Ref Range Status   Specimen Description URINE, CATHETERIZED  Final   Special Requests NONE  Final   Culture (A)  Final    >=100,000 COLONIES/mL GRAM NEGATIVE RODS SUSCEPTIBILITIES TO FOLLOW Performed at [facility], [place] [address], [place], [place] [address]    Report Status PENDING  Incomplete      Studies: ECHOCARDIOGRAM COMPLETE  Result Date: 11/03/2020    ECHOCARDIOGRAM REPORT   Patient Name:   CHARLENE HASBUN [facility] Date of Exam: 11/03/2020 Medical Rec #:  [id number]    Height:       64.0 in Accession #:    [id number]   Weight:       198.4 lb Date of Birth:  Jul 23, 1931     BSA:          1.950 m Patient Age:    73 years     BP:           111/84 mmHg Patient Gender: F            HR:           56 bpm. Exam Location:  Inpatient Procedure: 2D Echo, Color Doppler and Cardiac Doppler Indications:    CHF  History:        Patient has prior history of Echocardiogram examinations.  Sonographer:    NA Referring Phys: [place]  1. Left ventricular ejection fraction, by estimation, is 60 to 65%. The left ventricle has normal function. The left ventricle has no regional wall motion abnormalities. Left ventricular diastolic parameters are consistent with Grade I diastolic dysfunction (impaired relaxation).  2. Right ventricular systolic function is normal. The right ventricular size is normal. There is mildly elevated pulmonary artery systolic pressure. The estimated right ventricular systolic pressure is 0000000 mmHg.  3. Left atrial size was moderately dilated.  4. Right atrial size was mildly dilated.  5. The mitral valve is  normal in structure. No evidence of mitral valve regurgitation. No evidence of mitral stenosis. Moderate mitral annular calcification.  6. The aortic valve is tricuspid. Aortic valve regurgitation is not visualized. Mild aortic valve sclerosis is present, with no evidence of aortic valve stenosis.  7. The inferior vena cava is normal in size with greater than 50% respiratory variability, suggesting right atrial pressure of 3 mmHg. FINDINGS  Left Ventricle: Left ventricular ejection fraction, by estimation, is 60 to 65%. The left ventricle has normal function. The left ventricle has no regional wall motion abnormalities. The left ventricular internal cavity size was normal in size. There is  no left ventricular hypertrophy. Left ventricular diastolic parameters are consistent with Grade I diastolic dysfunction (impaired relaxation). Right Ventricle: The right ventricular size is normal. No increase in right ventricular wall thickness. Right ventricular systolic function is normal. There is mildly elevated pulmonary artery systolic pressure. The tricuspid regurgitant velocity is 3.24  m/s, and with an assumed right atrial pressure of 3 mmHg, the estimated right ventricular systolic pressure is 0000000 mmHg. Left Atrium: Left atrial size was moderately dilated. Right Atrium: Right atrial size was mildly dilated. Pericardium: Trivial pericardial effusion is present. Mitral Valve: The mitral valve is normal in structure. There is mild calcification of the mitral valve leaflet(s). Moderate mitral annular calcification. No evidence of mitral valve regurgitation. No evidence of mitral valve stenosis. Tricuspid Valve: The tricuspid valve is normal in structure. Tricuspid valve regurgitation is trivial. Aortic Valve: The aortic valve is tricuspid. Aortic valve regurgitation is not visualized. Mild aortic valve sclerosis is present, with no evidence of aortic valve stenosis. Pulmonic Valve: The pulmonic valve was normal in  structure. Pulmonic valve regurgitation is not visualized. Aorta: The aortic root is normal in size and structure. Venous: The inferior vena cava is normal in size with greater than 50% respiratory variability, suggesting right atrial pressure of 3 mmHg. IAS/Shunts: No atrial level shunt detected by color flow Doppler.  LEFT VENTRICLE PLAX 2D LVIDd:         4.70 cm  Diastology LVIDs:         2.90 cm  LV e' medial:    6.31 cm/s LV PW:         1.10 cm  LV E/e' medial:  16.3 LV IVS:        1.10 cm  LV e' lateral:   7.94 cm/s LVOT diam:     1.70 cm  LV E/e' lateral: 13.0 LV SV:         67 LV SV Index:   34 LVOT Area:     2.27 cm  RIGHT VENTRICLE RV S prime:     11.60 cm/s TAPSE (M-mode): 2.6 cm LEFT ATRIUM             Index       RIGHT ATRIUM           Index LA diam:        4.30 cm 2.21 cm/m  RA Area:     18.10 cm LA Vol (A2C):   54.5 ml 27.95 ml/m RA Volume:   47.50 ml  24.36 ml/m LA Vol (A4C):   85.3 ml 43.75 ml/m LA Biplane Vol: 69.3 ml 35.54 ml/m  AORTIC VALVE LVOT Vmax:   124.00 cm/s LVOT Vmean:  89.900 cm/s LVOT VTI:    0.294 m  AORTA Ao Root diam: 2.60 cm Ao Asc diam:  2.70 cm MITRAL VALVE                TRICUSPID VALVE MV Area (PHT): 3.65 cm     TR Peak grad:   42.0 mmHg MV Decel Time: 208 msec     TR Vmax:        324.00 cm/s MV E velocity: 103.00 cm/s MV A velocity: 112.00 cm/s  SHUNTS MV E/A ratio:  0.92         Systemic VTI:  0.29 m                             Systemic Diam: 1.70 cm Loralie Champagne MD Electronically signed by Loralie Champagne MD Signature Date/Time: 11/03/2020/6:02:35 PM    Final     Scheduled Meds: . allopurinol  100 mg Oral BID  . aspirin EC  81 mg Oral q morning - 10a  . atorvastatin  40 mg Oral q morning - 10a  . docusate sodium  100 mg Oral BID  . enoxaparin (LOVENOX) injection  30 mg Subcutaneous Q24H  . furosemide  40 mg Intravenous BID  . insulin aspart  0-15 Units Subcutaneous TID WC  . insulin aspart  0-5 Units Subcutaneous QHS  . insulin glargine  25 Units  Subcutaneous QHS  . ipratropium-albuterol  3 mL Nebulization Q6H  . sodium chloride flush  3 mL Intravenous Q12H    Continuous Infusions:   LOS: 1 day     Kayleen Memos, MD [facility] Pager [phone]  If 7PM-7AM, please contact night-coverage [email or web address] Password [facility] 11/04/2020, 12:56 PM

## 2020-11-04 NOTE — Evaluation (Signed)
Physical Therapy Evaluation Patient Details Name: [name] MRN: [id number] DOB: 1930-11-18 Today's Date: 11/04/2020   History of Present Illness  87 y.o. female admitted 12/31 with SOB and AMS. PMHx: obesity, back pain; HTN; HLD; DM; COPD on 2L [name] O2; CAD; stage 3 CKD; and chronic diastolic CHF, L THA  Clinical Impression  Pt lethargic on arrival arousable to voice but only stating "ok". With multimodal cues and assist able to sit EOB with increased arousal and command following. Pt able to perform return to bed and bridging but unsafe due to cognition and bipap to perform further mobility at this time. Pt with decreased cardiopulmonary function, cognition, transfers and mobility who will benefit from acute therapy to maximize mobility, safety and independence to decrease burden of care.  HR 62 SpO2 100% on Bipap    Follow Up Recommendations Home health PT;SNF;Supervision/Assistance - 24 hour (pending progression and medical stability)    Equipment Recommendations  None recommended by PT    Recommendations for Other Services OT consult     Precautions / Restrictions Precautions Precautions: Fall Precaution Comments: bipap      Mobility  Bed Mobility Overal bed mobility: Needs Assistance Bed Mobility: Supine to Sit;Sit to Supine     Supine to sit: Min assist     General bed mobility comments: assist to initiate legs moving toward EOb and minor assist to lift trunk from surface. REturn to bed minguard as pt able to bring legs up and scoot toward head. Pt unable to coordinate scooting along side of bed in sitting    Transfers                 General transfer comment: unsafe to attempt this date  Ambulation/Gait                Stairs            Wheelchair Mobility    Modified Rankin (Stroke Patients Only)       Balance Overall balance assessment: Needs assistance   Sitting balance-Leahy Scale: Good Sitting balance - Comments: pt able to sit  EOB without assist                                     Pertinent Vitals/Pain Pain Assessment: No/denies pain    Home Living Family/patient expects to be discharged to:: Private residence Living Arrangements: Children Available Help at Discharge: Family;Available 24 hours/day Type of Home: House Home Access: Stairs to enter   [employer] of Steps: 3 Home Layout: Two level;Able to live on main level with bedroom/bathroom Home Equipment: Dan Humphreys - 2 wheels;Walker - 4 wheels;Shower seat;Grab bars - tub/shower;Toilet riser;Adaptive equipment Additional Comments: home setup and PLOF taken from prior admission and limited response from pt which agreed with prior    Prior Function           Comments: walks with RW, lives with son who is home 24/7, independent with ADLS, uses WC for community     Hand Dominance        Extremity/Trunk Assessment   Upper Extremity Assessment Upper Extremity Assessment: Generalized weakness    Lower Extremity Assessment Lower Extremity Assessment: Generalized weakness (pt able to bend bil LE and bridge hips in supine)       Communication   Communication: HOH  Cognition Arousal/Alertness: Lethargic Behavior During Therapy: WFL for tasks assessed/performed Overall Cognitive Status: Impaired/Different from baseline Area  of Impairment: Orientation;Attention;Memory;Following commands;Safety/judgement                 Orientation Level: Disoriented to;Time;Situation;Place Current Attention Level: Focused Memory: Decreased short-term memory Following Commands: Follows one step commands inconsistently;Follows one step commands with increased time       General Comments: pt consistently stating ok in supine. Once sitting increased arousal and able to respond to commands and assist with mobility. Pt asking "did I die?"      General Comments      Exercises     Assessment/Plan    PT Assessment Patient needs  continued PT services  PT Problem List Decreased strength;Decreased mobility;Decreased safety awareness;Decreased activity tolerance;Decreased balance;Decreased knowledge of use of DME;Decreased cognition;Cardiopulmonary status limiting activity       PT Treatment Interventions Gait training;Balance training;DME instruction;Therapeutic exercise;Functional mobility training;Cognitive remediation;Therapeutic activities;Patient/family education    PT Goals (Current goals can be found in the Care Plan section)  Acute Rehab PT Goals Patient Stated Goal: return home PT Goal Formulation: With patient Time For Goal Achievement: 11/18/20 Potential to Achieve Goals: Fair    Frequency Min 3X/week   Barriers to discharge        Co-evaluation               AM-PAC PT "6 Clicks" Mobility  Outcome Measure Help needed turning from your back to your side while in a flat bed without using bedrails?: A Little Help needed moving from lying on your back to sitting on the side of a flat bed without using bedrails?: A Little Help needed moving to and from a bed to a chair (including a wheelchair)?: Total Help needed standing up from a chair using your arms (e.g., wheelchair or bedside chair)?: Total Help needed to walk in hospital room?: Total Help needed climbing 3-5 steps with a railing? : Total 6 Click Score: 10    End of Session   Activity Tolerance: Patient limited by fatigue Patient left: in bed;with call bell/phone within reach;with bed alarm set Nurse Communication: Mobility status PT Visit Diagnosis: Other abnormalities of gait and mobility (R26.89);Difficulty in walking, not elsewhere classified (R26.2);Muscle weakness (generalized) (M62.81)    Time: [id number] PT Time Calculation (min) (ACUTE ONLY): 19 min   Charges:   PT Evaluation $PT Eval Moderate Complexity: 1 Mod          Azul Brumett P, PT Acute Rehabilitation Services Pager: [phone] Office: [phone]   Khilee Hendricksen B  Izrael Peak 11/04/2020, 8:59 AM

## 2020-11-04 NOTE — Evaluation (Signed)
Occupational Therapy Evaluation Patient Details Name: [name] MRN: [id number] DOB: [birth year] Today's Date: 11/04/2020    History of Present Illness 87 y.o. female admitted 12/31 with SOB and AMS. PMHx: obesity, back pain; HTN; HLD; DM; COPD on 2L [place] O2; CAD; stage 3 CKD; and chronic diastolic CHF, L THA   Clinical Impression   PT admitted with SOB and AMS. Pt currently with functional limitiations due to the deficits listed below (see OT problem list). Pt currently on bipap O2 60% with fatigue/ lethargic and cognitive deficits. Pt limited to bed evaluation at this time. Pt did have PT and blood drawn prior to OT session.  Pt will benefit from skilled OT to increase their independence and safety with adls and balance to allow discharge SNF . PTA from home and will need to confirm family (A) ability prior to recommendation changes.      Follow Up Recommendations  SNF;Supervision/Assistance - 24 hour    Equipment Recommendations  Wheelchair (measurements OT);Wheelchair cushion (measurements OT);Hospital bed;3 in 1 bedside commode    Recommendations for Other Services PT consult;Speech consult (cognition deficits)     Precautions / Restrictions Precautions Precautions: Fall Precaution Comments: bipap      Mobility Bed Mobility Overal bed mobility: Needs Assistance Bed Mobility: Rolling Rolling: Max assist   Supine to sit: Min assist     General bed mobility comments: Pt unable to progress to eob at this time. pt reports fatigue and SOB    Transfers                 General transfer comment: defere to next session    Balance Overall balance assessment: Needs assistance   Sitting balance-Leahy Scale: Good Sitting balance - Comments: pt able to sit EOB without assist                                   ADL either performed or assessed with clinical judgement   ADL Overall ADL's : Needs assistance/impaired                                        General ADL Comments: pt will need (A) to self feed at this time base on limited bed mobility preformed during session. pt able to lift arm and touch head but not sustan positioning. pt closing eyes and opening with name call. pt unable to progress to EOB at this time. Pt requires max (A) for side roll and states "i can't breath" .     Vision   Additional Comments: to be further assessed. Pt tracking therapist. But does not sustain eyes open long enough to fully assess     Perception     Praxis      Pertinent Vitals/Pain Pain Assessment: No/denies pain     Hand Dominance Right   Extremity/Trunk Assessment Upper Extremity Assessment Upper Extremity Assessment: Generalized weakness   Lower Extremity Assessment Lower Extremity Assessment: Generalized weakness   Cervical / Trunk Assessment Cervical / Trunk Assessment: Kyphotic   Communication Communication Communication: HOH   Cognition Arousal/Alertness: Lethargic Behavior During Therapy: WFL for tasks assessed/performed Overall Cognitive Status: Impaired/Different from baseline Area of Impairment: Following commands                 Orientation Level: Disoriented to;Time;Situation;Place Current Attention Level: Focused Memory: Decreased  short-term memory Following Commands: Follows one step commands inconsistently;Follows one step commands with increased time       General Comments: pt unable to follow 2 step command with OT talking directly at ear. pt states "oh okay " and completed 1 out 2 steps. Pt continues to be internally distracted stating "i can't breath"   General Comments  Bipap FIO2 60%    Exercises Exercises: Other exercises Other Exercises Other Exercises: attempting ankle pumps- pt unable to coordinate movement without max facilitation from therapist x5 bil Other Exercises: shoulder flexion x2 bil unable to sustain Other Exercises: squeeze hand x2 with poor continued task    Shoulder Instructions      Home Living Family/patient expects to be discharged to:: Private residence Living Arrangements: Children Available Help at Discharge: Family;Available 24 hours/day Type of Home: House Home Access: Stairs to enter [employer] of Steps: 3 Entrance Stairs-Rails: Right;Left;Can reach both Home Layout: Two level;Able to live on main level with bedroom/bathroom     Bathroom Shower/Tub: Occupational psychologist: Handicapped height Bathroom Accessibility: Yes   Home Equipment: Environmental consultant - 2 wheels;Walker - 4 wheels;Shower seat;Grab bars - tub/shower;Toilet riser;Adaptive equipment Adaptive Equipment: Reacher;Sock aid Additional Comments: home setup and PLOF taken from prior admission and limited response from pt which agreed with prior      Prior Functioning/Environment Level of Independence: Independent with assistive device(s)        Comments: walks with RW, lives with son who is home 24/7, independent with ADLS, uses WC for community        OT Problem List: Decreased strength;Decreased activity tolerance;Impaired balance (sitting and/or standing);Decreased cognition;Decreased safety awareness;Decreased knowledge of use of DME or AE;Decreased knowledge of precautions;Cardiopulmonary status limiting activity;Obesity;Increased edema      OT Treatment/Interventions: Self-care/ADL training;Therapeutic exercise;Energy conservation;Manual therapy;DME and/or AE instruction;Modalities;Therapeutic activities;Patient/family education;Balance training;Cognitive remediation/compensation    OT Goals(Current goals can be found in the care plan section) Acute Rehab OT Goals Patient Stated Goal: none stated OT Goal Formulation: Patient unable to participate in goal setting Time For Goal Achievement: 11/18/20 Potential to Achieve Goals: Fair  OT Frequency: Min 2X/week   Barriers to D/C:    lives with son - no family present at this time to confirm  (A) levels       Co-evaluation              AM-PAC OT "6 Clicks" Daily Activity     Outcome Measure Help from another person eating meals?: A Lot Help from another person taking care of personal grooming?: A Lot Help from another person toileting, which includes using toliet, bedpan, or urinal?: Total Help from another person bathing (including washing, rinsing, drying)?: Total Help from another person to put on and taking off regular upper body clothing?: Total Help from another person to put on and taking off regular lower body clothing?: Total 6 Click Score: 8   End of Session Equipment Utilized During Treatment: Oxygen Nurse Communication: Mobility status;Precautions  Activity Tolerance: Patient limited by lethargy;Patient limited by fatigue Patient left: in bed;with call bell/phone within reach;with bed alarm set  OT Visit Diagnosis: Unsteadiness on feet (R26.81);Muscle weakness (generalized) (M62.81)                Time: [id number] OT Time Calculation (min): 15 min Charges:  OT General Charges $OT Visit: 1 Visit OT Evaluation $OT Eval Moderate Complexity: 1 Mod   [name], OTR/L  Acute Rehabilitation Services Pager: [phone] Office: [phone] .   [name]  [name] 11/04/2020, 10:04 AM

## 2020-11-05 DIAGNOSIS — J9622 Acute and chronic respiratory failure with hypercapnia: Secondary | ICD-10-CM | POA: Diagnosis not present

## 2020-11-05 LAB — URINE CULTURE: Culture: >=100000 — AB

## 2020-11-05 LAB — COMPREHENSIVE METABOLIC PANEL
ALT: 41 U/L (ref 0–44)
AST: 30 U/L (ref 15–41)
Albumin: 3.2 g/dL — ABNORMAL LOW (ref 3.5–5.0)
Alkaline Phosphatase: 57 U/L (ref 38–126)
Anion gap: 15 (ref 5–15)
BUN: 61 mg/dL — ABNORMAL HIGH (ref 8–23)
CO2: 29 mmol/L (ref 22–32)
Calcium: 9.2 mg/dL (ref 8.9–10.3)
Chloride: 96 mmol/L — ABNORMAL LOW (ref 98–111)
Creatinine, Ser: 1.66 mg/dL — ABNORMAL HIGH (ref 0.44–1.00)
GFR, Estimated: 30 mL/min — ABNORMAL LOW (ref >60)
Glucose, Bld: 210 mg/dL — ABNORMAL HIGH (ref 70–99)
Potassium: 4 mmol/L (ref 3.5–5.1)
Sodium: 140 mmol/L (ref 135–145)
Total Bilirubin: 0.7 mg/dL (ref 0.3–1.2)
Total Protein: 5.4 g/dL — ABNORMAL LOW (ref 6.5–8.1)

## 2020-11-05 LAB — GLUCOSE, CAPILLARY
Glucose-Capillary: 198 mg/dL — ABNORMAL HIGH (ref 70–99)
Glucose-Capillary: 231 mg/dL — ABNORMAL HIGH (ref 70–99)
Glucose-Capillary: 304 mg/dL — ABNORMAL HIGH (ref 70–99)
Glucose-Capillary: 413 mg/dL — ABNORMAL HIGH (ref 70–99)

## 2020-11-05 LAB — CBC
HCT: 34.5 % — ABNORMAL LOW (ref 36.0–46.0)
Hemoglobin: 10.7 g/dL — ABNORMAL LOW (ref 12.0–15.0)
MCH: 28.1 pg (ref 26.0–34.0)
MCHC: 31 g/dL (ref 30.0–36.0)
MCV: 90.6 fL (ref 80.0–100.0)
Platelets: 209 10*3/uL (ref 150–400)
RBC: 3.81 MIL/uL — ABNORMAL LOW (ref 3.87–5.11)
RDW: 15 % (ref 11.5–15.5)
WBC: 12.8 10*3/uL — ABNORMAL HIGH (ref 4.0–10.5)
nRBC: 0 % (ref 0.0–0.2)

## 2020-11-05 LAB — MAGNESIUM: Magnesium: 2.3 mg/dL (ref 1.7–2.4)

## 2020-11-05 MED ORDER — SODIUM CHLORIDE 0.9 % IV SOLN
1.0000 g | INTRAVENOUS | Status: AC
  Administered 2020-11-05 – 2020-11-08 (×4): 1 g via INTRAVENOUS
  Filled 2020-11-05: qty 1
  Filled 2020-11-05 (×2): qty 10
  Filled 2020-11-05: qty 1

## 2020-11-05 MED ORDER — INSULIN ASPART 100 UNIT/ML ~~LOC~~ SOLN
7.0000 [IU] | Freq: Once | SUBCUTANEOUS | Status: AC
  Administered 2020-11-05: 7 [IU] via SUBCUTANEOUS

## 2020-11-05 MED ORDER — GLUCERNA SHAKE PO LIQD
237.0000 mL | Freq: Three times a day (TID) | ORAL | Status: DC
  Administered 2020-11-05 – 2020-11-08 (×11): 237 mL via ORAL

## 2020-11-05 MED ORDER — METHYLPREDNISOLONE SODIUM SUCC 40 MG IJ SOLR
40.0000 mg | Freq: Two times a day (BID) | INTRAMUSCULAR | Status: DC
  Administered 2020-11-05 – 2020-11-06 (×4): 40 mg via INTRAVENOUS
  Filled 2020-11-05 (×4): qty 1

## 2020-11-05 MED ORDER — DM-GUAIFENESIN ER 30-600 MG PO TB12
2.0000 | ORAL_TABLET | Freq: Two times a day (BID) | ORAL | Status: DC
  Administered 2020-11-05 (×2): 2 via ORAL
  Filled 2020-11-05 (×2): qty 2

## 2020-11-05 MED ORDER — ADULT MULTIVITAMIN W/MINERALS CH
1.0000 | ORAL_TABLET | Freq: Every day | ORAL | Status: DC
  Administered 2020-11-05 – 2020-11-08 (×4): 1 via ORAL
  Filled 2020-11-05 (×4): qty 1

## 2020-11-05 NOTE — Progress Notes (Signed)
Pt order PRN for BIPAP. Pt respiratory status stable on 2Lpm [place] at this time w/no distress noted. RT will continue to monitor.

## 2020-11-05 NOTE — Plan of Care (Signed)

## 2020-11-05 NOTE — Progress Notes (Addendum)
Initial Nutrition Assessment  RD working remotely.  DOCUMENTATION CODES:   Obesity unspecified  INTERVENTION:   - Glucerna Shake po TID, each supplement provides 220 kcal and 10 grams of protein  - MVI with minerals daily  - If PO intake does not improve, recommend liberalizing diet to Regular to provide additional food options  NUTRITION DIAGNOSIS:   Increased nutrient needs related to chronic illness (COPD, CHF) as evidenced by estimated needs.  GOAL:   Patient will meet greater than or equal to 90% of their needs  MONITOR:   PO intake,Supplement acceptance,Labs,Weight trends,I & O's  REASON FOR ASSESSMENT:   Consult Other ("nutritional goals")  ASSESSMENT:   87 year old female who presented to the ED on 12/31 with AMS. PMH of back pain, HTN, HLD, DM, COPD, CAD, CKD stage III, CHF, gout. Pt was found to be severely hypercarbic and started on BiPAP.   RD was unable to reach pt via phone call to room. Will attempt to obtain diet and weight history on follow-up. Pt currently on Heart Healthy/Carb Modified diet. One meal completion of 0% at 0400 on 11/04/20 is recorded. No other meal completion data is available.  Reviewed available weight history in chart. Weight fairly stable between 88-90 kg over the last year. Current weight is 87.5 kg which is down from 90 kg on admission. Pt is net negative 1.4 L since admission. Suspect weight fluctuations since admission and over the last year are related to fluid status given CHF diagnosis.  Per RN edema assessment, pt with non-pitting edema to BLE. Difficult to determine dry weight at this time.  RD will order oral nutrition supplements to aid pt in meeting kcal and protein needs.  Medications reviewed and include: colace, lasix, SSI, lantus 25 units daily, IV abx  Labs reviewed: BUN 61, creatinine 1.66 CBG's: 198-244 x 24 hours  UOP: 1650 ml x 24 hours I/O's: -1.4 L since admit  NUTRITION - FOCUSED PHYSICAL EXAM:  Unable  to complete at this time. RD working remotely.  Diet Order:   Diet Order            Diet heart healthy/carb modified Room service appropriate? Yes; Fluid consistency: Thin  Diet effective now                 EDUCATION NEEDS:   No education needs have been identified at this time  Skin:  Skin Assessment: Reviewed RN Assessment  Last BM:  no documented BM  Height:   Ht Readings from Last 1 Encounters:  11/03/20 5\' 4"  (1.626 m)    Weight:   Wt Readings from Last 1 Encounters:  11/05/20 87.5 kg    BMI:  Body mass index is 33.11 kg/m.  Estimated Nutritional Needs:   Kcal:  1650-1850  Protein:  85-100 grams  Fluid:  1.6-1.8 L    01/03/21, MS, RD, LDN Inpatient Clinical Dietitian Please see AMiON for contact information.

## 2020-11-05 NOTE — Progress Notes (Signed)
PROGRESS NOTE  [name] [id number] DOB: 1931-10-30 DOA: 11/03/2020 PCP: Myrlene Broker, MD  HPI/Recap of past 24 hours: [name] is a 87 y.o. female with medical history significant of obesity (BMI 34); back pain; HTN; HLD; DM2; COPD on 2L [place] O2; CAD; stage 3 CKD; and chronic diastolic CHF presenting from home with SOB.    Work-up in the ED revealed severe hypercarbia with PCO2 in the 80s and pH 7.2.  She was started on continuous BiPAP with resolution of her hypercarbia and respiratory acidosis.  She was taken off the BiPAP on 11/04/2020 and was placed on 2 L nasal cannula with O2 saturation in the mid 90s.    Her urine analysis came back positive for pyuria and urine culture grew greater than 100,000 colonies of Proteus vulgaris with resistance to ampicillin, cefazolin and nitrofurantoin.  Her son whom she lives without reported productive cough at home.  Her chest x-ray did not show any evidence of pulmonary lobular infiltrates.  She was started on treatment for bronchitis.  11/05/20: Seen and examined at her bedside.  She is alert and oriented x3.  She denies any chest pain or dyspnea at rest.  Audible wheezing on exam.  IV Solu-Medrol started 40 mg twice daily.  She is also on scheduled bronchodilators.   Assessment/Plan: Principal Problem:   Acute on chronic respiratory failure with hypercapnia (HCC) Active Problems:   Hypertension, essential   Mixed dyslipidemia   Diabetes with neurologic complications (HCC)   Decompensated COPD (chronic obstructive pulmonary disease) (HCC)   Acute on chronic diastolic CHF (congestive heart failure) (HCC)   CKD (chronic kidney disease) stage 3, GFR 30-59 ml/min (HCC)   Gout  Acute on chronic hypoxic hypercarbic respiratory failure likely secondary to CO2 retention with suspected undiagnosed OSA. Presented severely hypercarbic with PCO2 in the 80s and pH in the 7.2 Was placed on continuous BiPAP with resolution of her hypercarbia and  respiratory acidosis. Patient has not been previously evaluated for OSA Will likely need to follow-up with pulmonology outpatient post hospitalization. Continue BiPAP as needed, at night Maintain O2 saturation greater than 88% She is currently back to her baseline oxygen requirement, 2 L.  Proteus vulgaris UTI, POA Her urine analysis came back positive for pyuria and urine culture grew greater than 100,000 colonies of Proteus vulgaris with resistance to ampicillin, cefazolin and nitrofurantoin.   Acute bronchitis Continue Z-Pak She is not able to tolerate doxycycline with reaction of nausea and vomiting IV Solu-Medrol 40 mg twice daily added on 11/05/2020.  AKI on CKD 3B At baseline creatinine appears to be 1.3 with GFR of 36 She presented with creatinine of 2.0 with GFR of 23 Avoid nephrotoxins, dehydration and hypotension Monitor urine output Repeat renal panel in the morning.  Subclinical hyperthyroidism TSH 0.259 and free T4 0.82 Will need to repeat TSH outpatient in 6 weeks.  Resolved acute toxic metabolic encephalopathy secondary to severe hypercarbia and Proteus vulgaris UTI She is currently back to her baseline mentation.  AKI on CKD 3B suspect prerenal in the setting of dehydration with poor oral intake Baseline creatinine appears to be 1.3 with GFR of 34 Presented with creatinine of 1.78 with GFR 27 Creatinine is uptrending 2.04 with GFR of 23. Urine output has not been recorded Monitor urine output Avoid nephrotoxins Repeat renal panel in the morning  Resolved hyperkalemia in the setting of renal insufficiency Presented with serum potassium of 5.7 She received IV Lasix, currently on IV Lasix 40  mg twice daily Serum potassium 4.0 Continue to closely monitor and replete as indicated.  Acute on chronic diastolic CHF Presented with elevated BNP over 900 2D echo done on 11/03/2020 showed normal LVEF 60 to 65% with grade 1 diastolic dysfunction She is currently on IV  Lasix 40 mg twice daily Start strict I's and O's and daily weight Net I&O -1.4 L  Gout Stable Home dose of allopurinol has been reduced to 100 mg daily at patient's son's request.  Physical debility PT OT assessment recommended home health PT OT Continue PT OT with assistance and fall precautions.  Code Status: DNR.  Family Communication: Updated her son Abe People and son [name] via phone on 11/04/2020.  Disposition Plan: Likely will DC to home on 11/06/2020.   Consultants:  None.  Procedures:  2D echo.  Antimicrobials:  None.  DVT prophylaxis: Subcu Lovenox daily.  Status is: Inpatient    Dispo:  Patient From: Home  Planned Disposition: Home  Expected discharge date: 11/07/2020  Medically stable for discharge: No, ongoing management of acute on chronic diastolic CHF, acute on chronic hypercarbic hypoxic respiratory failure, UTI, acute bronchitis.         Objective: Vitals:   11/05/20 0749 11/05/20 0812 11/05/20 0950 11/05/20 1156  BP: (!) 151/62  (!) 160/50 (!) 148/66  Pulse:      Resp: 13  19 (!) 21  Temp: 98.2 F (36.8 C)  98.2 F (36.8 C) 97.9 F (36.6 C)  TempSrc: Oral  Oral Oral  SpO2: 96% 94% 93% 97%  Weight:      Height:        Intake/Output Summary (Last 24 hours) at 11/05/2020 1230 Last data filed at 11/05/2020 0400 Gross per 24 hour  Intake 240 ml  Output 1650 ml  Net -1410 ml   Filed Weights   11/03/20 1014 11/04/20 0457 11/05/20 0338  Weight: 90 kg 91 kg 87.5 kg    Exam:  . General: 87 y.o. year-old female pleasant well-developed well-nourished no acute stress.  Alert and oriented x3.  . Cardiovascular: Regular rate and rhythm no rubs or gallops.  Marland Kitchen Respiratory: Diffuse wheezing bilaterally.  Good inspiratory effort.  . Abdomen: Soft nontender no bowel sounds present.  . Musculoskeletal: No lower extremity edema bilaterally.   . Skin: No ulcerative lesions noted. Marland Kitchen Psychiatry: Mood is appropriate for condition and  setting.   Data Reviewed: CBC: Recent Labs  Lab 11/03/20 1019 11/03/20 1243 11/03/20 1248 11/03/20 1835 11/04/20 0132 11/05/20 0553  WBC 14.2*  --   --   --  8.2 12.8*  NEUTROABS 12.7*  --   --   --   --   --   HGB 11.2* 12.2 11.9* 12.2 10.1* 10.7*  HCT 38.2 36.0 35.0* 36.0 32.8* 34.5*  MCV 96.0  --   --   --  92.7 90.6  PLT 217  --   --   --  179 [id number]   Basic Metabolic Panel: Recent Labs  Lab 11/03/20 1019 11/03/20 1243 11/03/20 1248 11/03/20 1835 11/04/20 0132 11/05/20 0553  NA 134* 134* 133* 133* 137 140  K 5.9* 5.7* 5.7* 5.2* 5.4* 4.0  CL 95* 94*  --   --  97* 96*  CO2 27  --   --   --  29 29  GLUCOSE 331* 302*  --   --  285* 210*  BUN 47* 52*  --   --  58* 61*  CREATININE 1.78* 1.70*  --   --  2.04* 1.66*  CALCIUM 8.8*  --   --   --  8.9 9.2  MG  --   --   --   --   --  2.3   GFR: Estimated Creatinine Clearance: 25.6 mL/min (A) (by C-G formula based on SCr of 1.66 mg/dL (H)). Liver Function Tests: Recent Labs  Lab 11/03/20 1019 11/05/20 0553  AST 55* 30  ALT 57* 41  ALKPHOS 77 57  BILITOT 0.6 0.7  PROT 5.9* 5.4*  ALBUMIN 3.5 3.2*   No results for input(s): LIPASE, AMYLASE in the last 168 hours. Recent Labs  Lab 11/03/20 1100  AMMONIA 28   Coagulation Profile: No results for input(s): INR, PROTIME in the last 168 hours. Cardiac Enzymes: Recent Labs  Lab 11/03/20 1019  CKTOTAL 64   BNP (last 3 results) No results for input(s): PROBNP in the last 8760 hours. HbA1C: Recent Labs    11/04/20 0132  HGBA1C 7.9*   CBG: Recent Labs  Lab 11/04/20 1158 11/04/20 1706 11/04/20 2032 11/05/20 0748 11/05/20 1126  GLUCAP 213* 243* 244* 198* 231*   Lipid Profile: No results for input(s): CHOL, HDL, LDLCALC, TRIG, CHOLHDL, LDLDIRECT in the last 72 hours. Thyroid Function Tests: Recent Labs    11/04/20 0132  TSH 0.259*  FREET4 0.82   Anemia Panel: No results for input(s): VITAMINB12, FOLATE, FERRITIN, TIBC, IRON, RETICCTPCT in the last 72  hours. Urine analysis:    Component Value Date/Time   COLORURINE YELLOW 11/03/2020 1317   APPEARANCEUR CLOUDY (A) 11/03/2020 1317   APPEARANCEUR Clear 05/23/2020 1627   LABSPEC 1.025 11/03/2020 1317   PHURINE 5.5 11/03/2020 1317   GLUCOSEU NEGATIVE 11/03/2020 1317   GLUCOSEU NEGATIVE 09/21/2020 1622   HGBUR NEGATIVE 11/03/2020 1317   [place] 11/03/2020 1317   BILIRUBINUR Negative 05/23/2020 [place] 11/03/2020 1317   PROTEINUR NEGATIVE 11/03/2020 1317   UROBILINOGEN 0.2 09/21/2020 1622   NITRITE NEGATIVE 11/03/2020 1317   LEUKOCYTESUR LARGE (A) 11/03/2020 1317   Sepsis Labs: @LABRCNTIP (procalcitonin:4,lacticidven:4)  ) Recent Results (from the past 240 hour(s))  Resp Panel by RT-PCR (Flu A&B, Covid) Nasopharyngeal Swab     Status: None   Collection Time: 11/03/20 10:26 AM   Specimen: Nasopharyngeal Swab; Nasopharyngeal(NP) swabs in vial transport medium  Result Value Ref Range Status   SARS Coronavirus 2 by RT PCR NEGATIVE NEGATIVE Final    Comment: (NOTE) SARS-CoV-2 target nucleic acids are NOT DETECTED.  The SARS-CoV-2 RNA is generally detectable in upper respiratory specimens during the acute phase of infection. The lowest concentration of SARS-CoV-2 viral copies this assay can detect is 138 copies/mL. A negative result does not preclude SARS-Cov-2 infection and should not be used as the sole basis for treatment or other patient management decisions. A negative result may occur with  improper specimen collection/handling, submission of specimen other than nasopharyngeal swab, presence of viral mutation(s) within the areas targeted by this assay, and inadequate number of viral copies(<138 copies/mL). A negative result must be combined with clinical observations, patient history, and epidemiological information. The expected result is Negative.  Fact Sheet for Patients:  EntrepreneurPulse.com.au  Fact Sheet for  Healthcare Providers:  IncredibleEmployment.be  This test is no t yet approved or cleared by the [place] FDA and  has been authorized for detection and/or diagnosis of SARS-CoV-2 by FDA under an Emergency Use Authorization (EUA). This EUA will remain  in effect (meaning this test can be used) for the duration of the COVID-19 declaration under Section  564(b)(1) of the Act, 21 U.S.C.section 360bbb-3(b)(1), unless the authorization is terminated  or revoked sooner.       Influenza A by PCR NEGATIVE NEGATIVE Final   Influenza B by PCR NEGATIVE NEGATIVE Final    Comment: (NOTE) The Xpert Xpress SARS-CoV-2/FLU/RSV plus assay is intended as an aid in the diagnosis of influenza from Nasopharyngeal swab specimens and should not be used as a sole basis for treatment. Nasal washings and aspirates are unacceptable for Xpert Xpress SARS-CoV-2/FLU/RSV testing.  Fact Sheet for Patients: EntrepreneurPulse.com.au  Fact Sheet for Healthcare Providers: IncredibleEmployment.be  This test is not yet approved or cleared by the [place] FDA and has been authorized for detection and/or diagnosis of SARS-CoV-2 by FDA under an Emergency Use Authorization (EUA). This EUA will remain in effect (meaning this test can be used) for the duration of the COVID-19 declaration under Section 564(b)(1) of the Act, 21 U.S.C. section 360bbb-3(b)(1), unless the authorization is terminated or revoked.  Performed at [facility], [place] [address] [place], [place] [address]   Urine culture     Status: Abnormal   Collection Time: 11/03/20  1:17 PM   Specimen: Urine, Catheterized  Result Value Ref Range Status   Specimen Description URINE, CATHETERIZED  Final   Special Requests   Final    NONE Performed at [facility], [place] [address], [place], [place] [address]    Culture >=100,000 COLONIES/mL PROTEUS VULGARIS (A)  Final   Report Status  11/05/2020 FINAL  Final   Organism ID, Bacteria PROTEUS VULGARIS (A)  Final      Susceptibility   Proteus vulgaris - MIC*    AMPICILLIN >=32 RESISTANT Resistant     CEFAZOLIN >=64 RESISTANT Resistant     CEFEPIME <=0.12 SENSITIVE Sensitive     CIPROFLOXACIN <=0.25 SENSITIVE Sensitive     GENTAMICIN <=1 SENSITIVE Sensitive     IMIPENEM 2 SENSITIVE Sensitive     NITROFURANTOIN >=512 RESISTANT Resistant     TRIMETH/SULFA <=20 SENSITIVE Sensitive     AMPICILLIN/SULBACTAM 4 SENSITIVE Sensitive     PIP/TAZO <=4 SENSITIVE Sensitive     * >=100,000 COLONIES/mL PROTEUS VULGARIS      Studies: No results found.  Scheduled Meds: . allopurinol  100 mg Oral Daily  . aspirin EC  81 mg Oral q morning - 10a  . atorvastatin  40 mg Oral q morning - 10a  . azithromycin  250 mg Oral Daily  . docusate sodium  100 mg Oral BID  . enoxaparin (LOVENOX) injection  30 mg Subcutaneous Q24H  . feeding supplement (GLUCERNA SHAKE)  237 mL Oral TID BM  . furosemide  40 mg Intravenous BID  . insulin aspart  0-15 Units Subcutaneous TID WC  . insulin aspart  0-5 Units Subcutaneous QHS  . insulin glargine  25 Units Subcutaneous QHS  . ipratropium-albuterol  3 mL Nebulization Q6H  . methylPREDNISolone (SOLU-MEDROL) injection  40 mg Intravenous BID  . multivitamin with minerals  1 tablet Oral Daily  . sodium chloride flush  3 mL Intravenous Q12H    Continuous Infusions: . cefTRIAXone (ROCEPHIN)  IV 1 g (11/05/20 0934)     LOS: 2 days     Kayleen Memos, MD [facility] Pager [phone]  If 7PM-7AM, please contact night-coverage [email or web address] Password [email or web address] 11/05/2020, 12:30 PM

## 2020-11-06 DIAGNOSIS — J9622 Acute and chronic respiratory failure with hypercapnia: Secondary | ICD-10-CM | POA: Diagnosis not present

## 2020-11-06 LAB — COMPREHENSIVE METABOLIC PANEL
ALT: 36 U/L (ref 0–44)
AST: 34 U/L (ref 15–41)
Albumin: 3.3 g/dL — ABNORMAL LOW (ref 3.5–5.0)
Alkaline Phosphatase: 67 U/L (ref 38–126)
Anion gap: 12 (ref 5–15)
BUN: 53 mg/dL — ABNORMAL HIGH (ref 8–23)
CO2: 31 mmol/L (ref 22–32)
Calcium: 9.2 mg/dL (ref 8.9–10.3)
Chloride: 92 mmol/L — ABNORMAL LOW (ref 98–111)
Creatinine, Ser: 1.49 mg/dL — ABNORMAL HIGH (ref 0.44–1.00)
GFR, Estimated: 34 mL/min — ABNORMAL LOW (ref >60)
Glucose, Bld: 326 mg/dL — ABNORMAL HIGH (ref 70–99)
Potassium: 4.8 mmol/L (ref 3.5–5.1)
Sodium: 135 mmol/L (ref 135–145)
Total Bilirubin: 0.7 mg/dL (ref 0.3–1.2)
Total Protein: 6.1 g/dL — ABNORMAL LOW (ref 6.5–8.1)

## 2020-11-06 LAB — CBC
HCT: 34.6 % — ABNORMAL LOW (ref 36.0–46.0)
Hemoglobin: 11.3 g/dL — ABNORMAL LOW (ref 12.0–15.0)
MCH: 28.8 pg (ref 26.0–34.0)
MCHC: 32.7 g/dL (ref 30.0–36.0)
MCV: 88 fL (ref 80.0–100.0)
Platelets: 224 10*3/uL (ref 150–400)
RBC: 3.93 MIL/uL (ref 3.87–5.11)
RDW: 15.2 % (ref 11.5–15.5)
WBC: 9 10*3/uL (ref 4.0–10.5)
nRBC: 0 % (ref 0.0–0.2)

## 2020-11-06 LAB — GLUCOSE, CAPILLARY
Glucose-Capillary: 270 mg/dL — ABNORMAL HIGH (ref 70–99)
Glucose-Capillary: 273 mg/dL — ABNORMAL HIGH (ref 70–99)
Glucose-Capillary: 297 mg/dL — ABNORMAL HIGH (ref 70–99)
Glucose-Capillary: 309 mg/dL — ABNORMAL HIGH (ref 70–99)
Glucose-Capillary: 355 mg/dL — ABNORMAL HIGH (ref 70–99)

## 2020-11-06 LAB — BLOOD GAS, VENOUS
Acid-Base Excess: 9.8 mmol/L — ABNORMAL HIGH (ref 0.0–2.0)
Bicarbonate: 34.5 mmol/L — ABNORMAL HIGH (ref 20.0–28.0)
Drawn by: 44525
FIO2: 28
O2 Saturation: 89.4 %
Patient temperature: 37
pCO2, Ven: 52.5 mmHg (ref 44.0–60.0)
pH, Ven: 7.433 — ABNORMAL HIGH (ref 7.250–7.430)
pO2, Ven: 56.7 mmHg — ABNORMAL HIGH (ref 32.0–45.0)

## 2020-11-06 MED ORDER — GUAIFENESIN-DM 100-10 MG/5ML PO SYRP
5.0000 mL | ORAL_SOLUTION | ORAL | Status: DC | PRN
  Administered 2020-11-06 – 2020-11-07 (×3): 5 mL via ORAL
  Filled 2020-11-06 (×3): qty 5

## 2020-11-06 MED ORDER — AMLODIPINE BESYLATE 5 MG PO TABS
5.0000 mg | ORAL_TABLET | Freq: Every morning | ORAL | Status: DC
Start: 2020-11-06 — End: 2020-11-08
  Administered 2020-11-06 – 2020-11-08 (×3): 5 mg via ORAL
  Filled 2020-11-06 (×3): qty 1

## 2020-11-06 MED ORDER — MELATONIN 3 MG PO TABS
3.0000 mg | ORAL_TABLET | Freq: Every evening | ORAL | Status: DC | PRN
  Administered 2020-11-07 – 2020-11-08 (×2): 3 mg via ORAL
  Filled 2020-11-06 (×2): qty 1

## 2020-11-06 MED ORDER — INSULIN ASPART 100 UNIT/ML ~~LOC~~ SOLN
5.0000 [IU] | Freq: Three times a day (TID) | SUBCUTANEOUS | Status: DC
  Administered 2020-11-06 – 2020-11-08 (×8): 5 [IU] via SUBCUTANEOUS

## 2020-11-06 MED ORDER — IPRATROPIUM-ALBUTEROL 0.5-2.5 (3) MG/3ML IN SOLN
3.0000 mL | Freq: Two times a day (BID) | RESPIRATORY_TRACT | Status: DC
  Administered 2020-11-06 – 2020-11-08 (×5): 3 mL via RESPIRATORY_TRACT
  Filled 2020-11-06 (×5): qty 3

## 2020-11-06 MED ORDER — MOMETASONE FURO-FORMOTEROL FUM 200-5 MCG/ACT IN AERO
2.0000 | INHALATION_SPRAY | Freq: Two times a day (BID) | RESPIRATORY_TRACT | Status: DC
  Administered 2020-11-06 – 2020-11-08 (×4): 2 via RESPIRATORY_TRACT
  Filled 2020-11-06: qty 8.8

## 2020-11-06 NOTE — Plan of Care (Signed)

## 2020-11-06 NOTE — Care Management Important Message (Signed)
Important Message  Patient Details  Name: [name] MRN: [id number] Date of Birth: Aug 08, 1931   Medicare Important Message Given:  Yes     Saniyya Gau 11/06/2020, 1:32 PM

## 2020-11-06 NOTE — Progress Notes (Signed)
Pt not in need of BIPAP at this time. Pts respiratory status is stable w/no distress noted. RT will continue to monitor.

## 2020-11-06 NOTE — TOC Progression Note (Signed)
Transition of Care [facility]) - Progression Note    Patient Details  Name: MAKENLEY SHIMP MRN: [id number] Date of Birth: Apr 01, 1931  Transition of Care [facility]) CM/SW Contact  Carley Hammed, [place] Phone Number: 11/06/2020, 5:01 PM  Clinical Narrative:    CM set up [facility] for this pt, per the recommendation. CSW received request from nurse to speak with son and pt at bedside. The son asked to see me asking about when she might DC. He said that he feels rushed because she has two major infections and other health issues. He does not want HH to come in or her to do SNF due to covid. He received a call over the weekend to discuss [facility] and when he asked questions they were rude and hung up. [facility] will need to be canceled as pt said it can not be guaranteed that [facility] is vaccinated.  He also wanted to know why pt hadn't been seen by cardiology, and he felt they were rushing him out. He asked that another PT eval be ordered to see if he felt safe taking her home. The nurse ordered PT. CSW asked for dr. To follow up with son. SW will continue to follow for transition of care.    Expected Discharge Plan and Services                                     HH Arranged: PT HH Agency: [facility] Date [facility] Agency Contacted: 11/06/20 Time HH Agency Contacted: 1357 Representative spoke with at [facility] Agency: Arline Asp   Social Determinants of Health (SDOH) Interventions    Readmission Risk Interventions No flowsheet data found.

## 2020-11-06 NOTE — TOC Progression Note (Signed)
Transition of Care [facility]) - Progression Note    Patient Details  Name: [name] MRN: [id number] Date of Birth: January 20, 1931  Transition of Care [facility]) CM/SW Contact  Beckie Busing, RN Phone Number: [phone]  11/06/2020, 2:02 PM  Clinical Narrative:     Home Health PT has been set up with [facility].       Expected Discharge Plan and Services                                     HH Arranged: PT HH Agency: [facility] Date [facility] Agency Contacted: 11/06/20 Time HH Agency Contacted: 1357 Representative spoke with at [facility] Agency: Arline Asp   Social Determinants of Health (SDOH) Interventions    Readmission Risk Interventions No flowsheet data found.

## 2020-11-06 NOTE — Plan of Care (Signed)
  Problem: Education: Goal: Knowledge of General Education information will improve Description Including pain rating scale, medication(s)/side effects and non-pharmacologic comfort measures Outcome: Progressing   

## 2020-11-06 NOTE — Progress Notes (Addendum)
PROGRESS NOTE  [name] [id number] DOB: 1931-07-17 DOA: 11/03/2020 PCP: Hoyt Koch, MD  HPI/Recap of past 24 hours: [name] is a 87 y.o. female with medical history significant of obesity (BMI 34); back pain; HTN; HLD; DM2; COPD on 2L [place] O2; CAD; stage 3 CKD; and chronic diastolic CHF presenting from home with SOB.    Work-up in the ED revealed severe hypercarbia with PCO2 in the 80s and pH 7.2.  She was started on continuous BiPAP with resolution of her hypercarbia and respiratory acidosis.  She was taken off the BiPAP on 11/04/2020 and was placed on 2 L nasal cannula with O2 saturation in the mid 90s.    Her urine analysis came back positive for pyuria and urine culture grew greater than 100,000 colonies of Proteus vulgaris with resistance to ampicillin, cefazolin and nitrofurantoin.  Her son whom she lives without reported productive cough at home.  Her chest x-ray did not show any evidence of pulmonary lobular infiltrates.  She was started on treatment for bronchitis.  IV Solu-Medrol was added on 11/05/2020 due to audible wheezing.  She is also on scheduled bronchodilators.  11/06/20: Patient was seen and examined at her bedside.  There were no acute events overnight.  Mild wheezing noted bilaterally this morning, improved from prior.  Assessment/Plan: Principal Problem:   Acute on chronic respiratory failure with hypercapnia (HCC) Active Problems:   Hypertension, essential   Mixed dyslipidemia   Diabetes with neurologic complications (HCC)   Decompensated COPD (chronic obstructive pulmonary disease) (HCC)   Acute on chronic diastolic CHF (congestive heart failure) (HCC)   CKD (chronic kidney disease) stage 3, GFR 30-59 ml/min (HCC)   Gout  Acute on chronic hypoxic hypercarbic respiratory failure likely secondary to CO2 retention with suspected undiagnosed OSA. Presented severely hypercarbic with PCO2 in the 80s and pH in the 7.2 Was placed on continuous BiPAP with  resolution of her hypercarbia and respiratory acidosis. Patient has not been previously evaluated for OSA Will likely need to follow-up with pulmonology outpatient post hospitalization. Continue BiPAP as needed, at night Continue to maintain O2 saturation greater than 88% She is currently back to her baseline oxygen requirement, 2 L.  Proteus vulgaris UTI, POA Her urine analysis came back positive for pyuria and urine culture grew greater than 100,000 colonies of Proteus vulgaris with resistance to ampicillin, cefazolin and nitrofurantoin.   Acute bronchitis Continue Z-Pak Added IV Solu-Medrol 40 mg twice daily on 11/05/2020, continue. Wean off IV steroid as able and transition to prednisone taper on 11/07/2020 for DC planning. She is not able to tolerate doxycycline with reaction of nausea and vomiting  Suspected OSA/OHS Presented with significant respiratory acidosis with PH 7.2 and PCO2 82 Recommend to follow up with pulmonary or neurology for evaluation of OSA Will need referral at discharge Obtain VBG on 11/06/2020 to reassess, follow result CPAP qhs/ Received continuous BIPAP on admission  Resolving AKI on CKD 3B suspect prerenal in the setting of dehydration with poor oral intake  At baseline creatinine appears to be 1.3 with GFR of 36 She presented with creatinine of 2.0 with GFR of 23 Creatinine is downtrending 1.4 with GFR of 34. Avoid nephrotoxins, dehydration and hypotension Monitor urine output Repeat renal panel in the morning.  Subclinical hyperthyroidism TSH 0.259 and free T4 0.82 Will need to repeat TSH outpatient in 6 weeks.  Resolved acute toxic metabolic encephalopathy secondary to severe hypercarbia and Proteus vulgaris UTI She is currently back to her  baseline mentation.  Resolved hyperkalemia in the setting of renal insufficiency Presented with serum potassium of 5.7 She received IV Lasix, currently on IV Lasix 40 mg twice daily Serum potassium 4.0 Continue  to closely monitor and replete as indicated.  Acute on chronic diastolic CHF Presented with elevated BNP over 900 2D echo done on 11/03/2020 showed normal LVEF 60 to 65% with grade 1 diastolic dysfunction She is currently on IV Lasix 40 mg twice daily Continue strict I's and O's and daily weight Net I&O -3.2 L  Gout Stable Home dose of allopurinol has been reduced to 100 mg daily at patient's son's request.  Physical debility PT OT assessment recommended home health PT OT Continue PT OT with assistance and fall precautions.  Code Status: DNR.  Family Communication: Updated her son [name] and son [name] via phone on 11/04/2020.  Disposition Plan: Likely will DC to home on 11/07/2020.   Consultants:  None.  Procedures:  2D echo.  Antimicrobials:  None.  DVT prophylaxis: Subcu Lovenox daily.  Status is: Inpatient    Dispo:  Patient From: Home  Planned Disposition: Home with Health Care Svc  Expected discharge date: 11/07/20  Medically stable for discharge: No, ongoing management of acute on chronic diastolic CHF, acute on chronic hypercarbic hypoxic respiratory failure, UTI, acute bronchitis.         Objective: Vitals:   11/06/20 0349 11/06/20 0803 11/06/20 0812 11/06/20 1148  BP: (!) 164/61  (!) 144/61 (!) 153/68  Pulse:   65 86  Resp: 17   17  Temp: 98.5 F (36.9 C)   98.2 F (36.8 C)  TempSrc: Oral   Oral  SpO2: 98% 96%  99%  Weight:      Height:        Intake/Output Summary (Last 24 hours) at 11/06/2020 1256 Last data filed at 11/06/2020 0700 Gross per 24 hour  Intake 240 ml  Output 2100 ml  Net -1860 ml   Filed Weights   11/03/20 1014 11/04/20 0457 11/05/20 0338  Weight: 90 kg 91 kg 87.5 kg    Exam:  . General: 87 y.o. year-old female well-developed well-nourished no acute distress.  Alert oriented x3. . Cardiovascular: Regular rate and rhythm no rubs or gallops.  Respiratory: Diffuse wheezing bilaterally.  Good respiratory  effort. . Abdomen: Obese nontender normal bowel sounds present.  . Musculoskeletal: No lower extremity edema bilaterally. . Skin: No ulcerative lesions noted. Marland Kitchen Psychiatry: Mood is appropriate for condition and setting.   Data Reviewed: CBC: Recent Labs  Lab 11/03/20 1019 11/03/20 1243 11/03/20 1248 11/03/20 1835 11/04/20 0132 11/05/20 0553 11/06/20 0050  WBC 14.2*  --   --   --  8.2 12.8* 9.0  NEUTROABS 12.7*  --   --   --   --   --   --   HGB 11.2*   < > 11.9* 12.2 10.1* 10.7* 11.3*  HCT 38.2   < > 35.0* 36.0 32.8* 34.5* 34.6*  MCV 96.0  --   --   --  92.7 90.6 88.0  PLT 217  --   --   --  179 209 224   < > = values in this interval not displayed.   Basic Metabolic Panel: Recent Labs  Lab 11/03/20 1019 11/03/20 1243 11/03/20 1248 11/03/20 1835 11/04/20 0132 11/05/20 0553 11/06/20 0050  NA 134* 134* 133* 133* 137 140 135  K 5.9* 5.7* 5.7* 5.2* 5.4* 4.0 4.8  CL 95* 94*  --   --  97* 96* 92*  CO2 27  --   --   --  29 29 31   GLUCOSE 331* 302*  --   --  285* 210* 326*  BUN 47* 52*  --   --  58* 61* 53*  CREATININE 1.78* 1.70*  --   --  2.04* 1.66* 1.49*  CALCIUM 8.8*  --   --   --  8.9 9.2 9.2  MG  --   --   --   --   --  2.3  --    GFR: Estimated Creatinine Clearance: 28.5 mL/min (A) (by C-G formula based on SCr of 1.49 mg/dL (H)). Liver Function Tests: Recent Labs  Lab 11/03/20 1019 11/05/20 0553 11/06/20 0050  AST 55* 30 34  ALT 57* 41 36  ALKPHOS 77 57 67  BILITOT 0.6 0.7 0.7  PROT 5.9* 5.4* 6.1*  ALBUMIN 3.5 3.2* 3.3*   No results for input(s): LIPASE, AMYLASE in the last 168 hours. Recent Labs  Lab 11/03/20 1100  AMMONIA 28   Coagulation Profile: No results for input(s): INR, PROTIME in the last 168 hours. Cardiac Enzymes: Recent Labs  Lab 11/03/20 1019  CKTOTAL 64   BNP (last 3 results) No results for input(s): PROBNP in the last 8760 hours. HbA1C: Recent Labs    11/04/20 0132  HGBA1C 7.9*   CBG: Recent Labs  Lab 11/05/20 1126  11/05/20 1614 11/05/20 2055 11/06/20 0810 11/06/20 1145  GLUCAP 231* 304* 413* 270* 273*   Lipid Profile: No results for input(s): CHOL, HDL, LDLCALC, TRIG, CHOLHDL, LDLDIRECT in the last 72 hours. Thyroid Function Tests: Recent Labs    11/04/20 0132  TSH 0.259*  FREET4 0.82   Anemia Panel: No results for input(s): VITAMINB12, FOLATE, FERRITIN, TIBC, IRON, RETICCTPCT in the last 72 hours. Urine analysis:    Component Value Date/Time   COLORURINE YELLOW 11/03/2020 1317   APPEARANCEUR CLOUDY (A) 11/03/2020 1317   APPEARANCEUR Clear 05/23/2020 1627   LABSPEC 1.025 11/03/2020 1317   PHURINE 5.5 11/03/2020 1317   GLUCOSEU NEGATIVE 11/03/2020 1317   GLUCOSEU NEGATIVE 09/21/2020 1622   HGBUR NEGATIVE 11/03/2020 1317   [place] 11/03/2020 1317   BILIRUBINUR Negative 05/23/2020 [place] 11/03/2020 1317   PROTEINUR NEGATIVE 11/03/2020 1317   UROBILINOGEN 0.2 09/21/2020 1622   NITRITE NEGATIVE 11/03/2020 1317   LEUKOCYTESUR LARGE (A) 11/03/2020 1317   Sepsis Labs: @LABRCNTIP (procalcitonin:4,lacticidven:4)  ) Recent Results (from the past 240 hour(s))  Resp Panel by RT-PCR (Flu A&B, Covid) Nasopharyngeal Swab     Status: None   Collection Time: 11/03/20 10:26 AM   Specimen: Nasopharyngeal Swab; Nasopharyngeal(NP) swabs in vial transport medium  Result Value Ref Range Status   SARS Coronavirus 2 by RT PCR NEGATIVE NEGATIVE Final    Comment: (NOTE) SARS-CoV-2 target nucleic acids are NOT DETECTED.  The SARS-CoV-2 RNA is generally detectable in upper respiratory specimens during the acute phase of infection. The lowest concentration of SARS-CoV-2 viral copies this assay can detect is 138 copies/mL. A negative result does not preclude SARS-Cov-2 infection and should not be used as the sole basis for treatment or other patient management decisions. A negative result may occur with  improper specimen collection/handling, submission of specimen  other than nasopharyngeal swab, presence of viral mutation(s) within the areas targeted by this assay, and inadequate number of viral copies(<138 copies/mL). A negative result must be combined with clinical observations, patient history, and epidemiological information. The expected result is Negative.  Fact Sheet for  Patients:  EntrepreneurPulse.com.au  Fact Sheet for Healthcare Providers:  IncredibleEmployment.be  This test is no t yet approved or cleared by the [place] FDA and  has been authorized for detection and/or diagnosis of SARS-CoV-2 by FDA under an Emergency Use Authorization (EUA). This EUA will remain  in effect (meaning this test can be used) for the duration of the COVID-19 declaration under Section 564(b)(1) of the Act, 21 U.S.C.section 360bbb-3(b)(1), unless the authorization is terminated  or revoked sooner.       Influenza A by PCR NEGATIVE NEGATIVE Final   Influenza B by PCR NEGATIVE NEGATIVE Final    Comment: (NOTE) The Xpert Xpress SARS-CoV-2/FLU/RSV plus assay is intended as an aid in the diagnosis of influenza from Nasopharyngeal swab specimens and should not be used as a sole basis for treatment. Nasal washings and aspirates are unacceptable for Xpert Xpress SARS-CoV-2/FLU/RSV testing.  Fact Sheet for Patients: EntrepreneurPulse.com.au  Fact Sheet for Healthcare Providers: IncredibleEmployment.be  This test is not yet approved or cleared by the [place] FDA and has been authorized for detection and/or diagnosis of SARS-CoV-2 by FDA under an Emergency Use Authorization (EUA). This EUA will remain in effect (meaning this test can be used) for the duration of the COVID-19 declaration under Section 564(b)(1) of the Act, 21 U.S.C. section 360bbb-3(b)(1), unless the authorization is terminated or revoked.  Performed at [facility], [place] [address], [place],  [place] [address]   Urine culture     Status: Abnormal   Collection Time: 11/03/20  1:17 PM   Specimen: Urine, Catheterized  Result Value Ref Range Status   Specimen Description URINE, CATHETERIZED  Final   Special Requests   Final    NONE Performed at [facility], [place] [address] [place], [place] [address]    Culture >=100,000 COLONIES/mL PROTEUS VULGARIS (A)  Final   Report Status 11/05/2020 FINAL  Final   Organism ID, Bacteria PROTEUS VULGARIS (A)  Final      Susceptibility   Proteus vulgaris - MIC*    AMPICILLIN >=32 RESISTANT Resistant     CEFAZOLIN >=64 RESISTANT Resistant     CEFEPIME <=0.12 SENSITIVE Sensitive     CIPROFLOXACIN <=0.25 SENSITIVE Sensitive     GENTAMICIN <=1 SENSITIVE Sensitive     IMIPENEM 2 SENSITIVE Sensitive     NITROFURANTOIN >=512 RESISTANT Resistant     TRIMETH/SULFA <=20 SENSITIVE Sensitive     AMPICILLIN/SULBACTAM 4 SENSITIVE Sensitive     PIP/TAZO <=4 SENSITIVE Sensitive     * >=100,000 COLONIES/mL PROTEUS VULGARIS      Studies: No results found.  Scheduled Meds: . allopurinol  100 mg Oral Daily  . aspirin EC  81 mg Oral q morning - 10a  . atorvastatin  40 mg Oral q morning - 10a  . azithromycin  250 mg Oral Daily  . docusate sodium  100 mg Oral BID  . enoxaparin (LOVENOX) injection  30 mg Subcutaneous Q24H  . feeding supplement (GLUCERNA SHAKE)  237 mL Oral TID BM  . furosemide  40 mg Intravenous BID  . insulin aspart  0-15 Units Subcutaneous TID WC  . insulin aspart  0-5 Units Subcutaneous QHS  . insulin aspart  5 Units Subcutaneous TID WC  . insulin glargine  25 Units Subcutaneous QHS  . ipratropium-albuterol  3 mL Nebulization BID  . methylPREDNISolone (SOLU-MEDROL) injection  40 mg Intravenous BID  . multivitamin with minerals  1 tablet Oral Daily  . sodium chloride flush  3  mL Intravenous Q12H    Continuous Infusions: . cefTRIAXone (ROCEPHIN)  IV 1 g (11/06/20 1145)     LOS: 3 days     Kayleen Memos, MD [facility] Pager [phone]  If 7PM-7AM, please contact night-coverage [email or web address] Password [facility] 11/06/2020, 12:56 PM

## 2020-11-07 DIAGNOSIS — J9622 Acute and chronic respiratory failure with hypercapnia: Secondary | ICD-10-CM | POA: Diagnosis not present

## 2020-11-07 LAB — COMPREHENSIVE METABOLIC PANEL
ALT: 35 U/L (ref 0–44)
AST: 26 U/L (ref 15–41)
Albumin: 3.1 g/dL — ABNORMAL LOW (ref 3.5–5.0)
Alkaline Phosphatase: 58 U/L (ref 38–126)
Anion gap: 12 (ref 5–15)
BUN: 50 mg/dL — ABNORMAL HIGH (ref 8–23)
CO2: 33 mmol/L — ABNORMAL HIGH (ref 22–32)
Calcium: 9.3 mg/dL (ref 8.9–10.3)
Chloride: 90 mmol/L — ABNORMAL LOW (ref 98–111)
Creatinine, Ser: 1.51 mg/dL — ABNORMAL HIGH (ref 0.44–1.00)
GFR, Estimated: 33 mL/min — ABNORMAL LOW (ref >60)
Glucose, Bld: 336 mg/dL — ABNORMAL HIGH (ref 70–99)
Potassium: 5 mmol/L (ref 3.5–5.1)
Sodium: 135 mmol/L (ref 135–145)
Total Bilirubin: 0.4 mg/dL (ref 0.3–1.2)
Total Protein: 5.7 g/dL — ABNORMAL LOW (ref 6.5–8.1)

## 2020-11-07 LAB — CBC
HCT: 36.4 % (ref 36.0–46.0)
Hemoglobin: 11.3 g/dL — ABNORMAL LOW (ref 12.0–15.0)
MCH: 27.9 pg (ref 26.0–34.0)
MCHC: 31 g/dL (ref 30.0–36.0)
MCV: 89.9 fL (ref 80.0–100.0)
Platelets: 215 10*3/uL (ref 150–400)
RBC: 4.05 MIL/uL (ref 3.87–5.11)
RDW: 15.3 % (ref 11.5–15.5)
WBC: 9.3 10*3/uL (ref 4.0–10.5)
nRBC: 0 % (ref 0.0–0.2)

## 2020-11-07 LAB — GLUCOSE, CAPILLARY
Glucose-Capillary: 260 mg/dL — ABNORMAL HIGH (ref 70–99)
Glucose-Capillary: 238 mg/dL — ABNORMAL HIGH (ref 70–99)
Glucose-Capillary: 194 mg/dL — ABNORMAL HIGH (ref 70–99)
Glucose-Capillary: 293 mg/dL — ABNORMAL HIGH (ref 70–99)

## 2020-11-07 MED ORDER — PREDNISONE 20 MG PO TABS
40.0000 mg | ORAL_TABLET | Freq: Every day | ORAL | Status: DC
  Administered 2020-11-07 – 2020-11-08 (×2): 40 mg via ORAL
  Filled 2020-11-07 (×2): qty 2

## 2020-11-07 MED ORDER — INSULIN GLARGINE 100 UNIT/ML ~~LOC~~ SOLN
30.0000 [IU] | Freq: Every day | SUBCUTANEOUS | Status: DC
  Administered 2020-11-07: 30 [IU] via SUBCUTANEOUS
  Filled 2020-11-07 (×2): qty 0.3

## 2020-11-07 MED ORDER — FUROSEMIDE 40 MG PO TABS
40.0000 mg | ORAL_TABLET | Freq: Two times a day (BID) | ORAL | Status: DC
  Administered 2020-11-07 – 2020-11-08 (×3): 40 mg via ORAL
  Filled 2020-11-07 (×3): qty 1

## 2020-11-07 NOTE — TOC Progression Note (Addendum)
Transition of Care [facility]) - Progression Note    Patient Details  Name: [name] MRN: [id number] Date of Birth: Jun 11, 1931  [facility] Care [facility]) CM/SW Contact  Lorri Frederick, LCSW Phone Number: 11/07/2020, 3:38 PM  Clinical Narrative:   CSW spoke with pt son Laurie Needle regarding discharge plan. He confirmed that he is declining both SNF and HH services due to concerns about staff members and covid.  He is not planning to pursue outpt PT for the same reason.  [facility] provides O2.  Confirmed PCP [place].  He is planning to pick pt up tomorrow and confirmed that he provides transportation to all her MD appts.  1540: confirmed with Velna Hatchet at [facility] that they provide O2. 1545: cancelled [facility], spoke with [facility].         Expected Discharge Plan and Services                                     HH Arranged: PT HH Agency: [facility] Date [facility] Agency Contacted: 11/06/20 Time HH Agency Contacted: 1357 Representative spoke with at [facility] Agency: Arline Asp   Social Determinants of Health (SDOH) Interventions    Readmission Risk Interventions No flowsheet data found.

## 2020-11-07 NOTE — Progress Notes (Signed)
Pt currently stable and doesn't need BiPAP at this time RT will continue to monitor.

## 2020-11-07 NOTE — Progress Notes (Addendum)
PROGRESS NOTE    [name]  [id number] DOB: [birth year] DOA: 11/03/2020 PCP: Hoyt Koch, MD    Brief Narrative:  [name] is an 87 year old female with past medical history significant for obesity, back pain, essential hypertension, hyperlipidemia, type 2 diabetes mellitus, COPD on 2 L nasal cannula, CAD, CKD stage III, chronic diastolic congestive heart failure who presented to the ED via EMS with progressive shortness of breath.  States recent cough and congestion and vaccinated and boosted against Covid-19.  Also with history of recurrent UTIs with complaints of dysuria.  Reports compliance with her home furosemide and COPD medications.  Son also reported that she has been more confused unable to take care of herself for the last few days.  Evaluation the ED revealed hypercarbia and respiratory acidosis.  Also urinalysis consistent with UTI.  PaCO2 81.  Patient was treated with duo nebs, albuterol and Lasix.  Covid-19 PCR negative.  BNP elevated 970 but chest x-ray clear.  Creatinine 1.78 with a baseline of 1.3 on mission.  Patient placed on BiPAP.  Started on ceftriaxone.  Hospital service consulted for further evaluation and management of acute hypoxic/hypercarbic respiratory failure secondary to COPD exacerbation and UTI.     Assessment & Plan:   Principal Problem:   Acute on chronic respiratory failure with hypercapnia (HCC) Active Problems:   Hypertension, essential   Mixed dyslipidemia   Diabetes with neurologic complications (HCC)   Decompensated COPD (chronic obstructive pulmonary disease) (HCC)   Acute on chronic diastolic CHF (congestive heart failure) (HCC)   CKD (chronic kidney disease) stage 3, GFR 30-59 ml/min (HCC)   Gout   Acute on chronic hypoxic/hypercarbic respiratory failure COPD exacerbation Patient presenting to ED with progressive confusion, shortness of breath was found to have elevated PaCO2 of 81 on arrival and was hypoxic on her 2 L  nasal cannula.  Chest x-ray with no pulmonary edema findings with bibasilar atelectasis. --BiPAP now titrated off, now on 2L [place] which is her baseline --Titrate IV Solu-Medrol to prednisone 40 mg p.o. daily today --Continue azithromycin 2050 mg p.o. daily to complete 5-day course  Concern for undiagnosed OSA Patient presenting with significant respiratory acidosis with a pH of 7.2 and PCO2 of 82.  Would benefit from outpatient pulmonology versus neurology evaluation with sleep study. --Continue supplemental oxygen, currently on baseline 2 L per nasal cannula  Proteus vulgaris UTI, POA Urine culture with greater than 100K colonies Proteus vulgaris with resistance to ampicillin, cefazolin and nitrofurantoin.  Completed 5-day course of ceftriaxone on 11/08/2019  Acute renal failure on CKD stage IIIb Etiology likely secondary to prerenal azotemia/dehydration in the setting of poor oral intake in the days preceding hospitalization.  Baseline creatinine 1.3 with a GFR of 36. --Cr 2.0>1.4>1.5 --de-escalate IV Lasix back to home p.o. dosing --Avoid nephrotoxins, renally dose all medications --Repeat BMP in the a.m.  Subclinical hyperthyroidism TSH 0.259, free T4 0.82.  Repeat TSH outpatient in 6 weeks  Hyperkalemia: Resolved Patient presenting with serum potassium of 5.7, likely secondary to acute renal failure.  Received IV Lasix, albuterol with resolution. --Potassium 5.0 this morning --Continue monitor renal function closely daily with electrolytes  Chronic diastolic congestive heart failure BNP on admission elevated 970 but chest x-ray shows no overt pulmonary edema and does note bibasilar atelectasis.  Patient was initially started on IV furosemide 40 mg twice daily, now transition back to oral. --net negative 2.8L since admission --wt 90kg>87.5kg --Furosemide 40 mg p.o. BID --Continue monitor strict I's and  O's and daily weights  Essential hypertension --Amlodipine 5 mg p.o.  daily --Continue aspirin and statin  Type 2 diabetes mellitus Hemoglobin A1c 7.9 on 11/03/2020.  Includes Lantus 25 units obviously daily, NovoLog 3-8 units subcutaneously 3 times daily AC. --Lantus 25u daily --Novolog 5u TIDAC --Insulin/scale for further coverage --CBGs before every meal/at bedtime  HLD: Continue atorvastatin 40 mg p.o. daily  Gout --Allopurinol 100 mg p.o. daily  Weakness, debility, deconditioning Patient seen and evaluated by PT/OT with recommendations of home health on discharge.    DVT prophylaxis: Lovenox Code Status: DNR Family Communication: updated patients son, Laurie Needle via telephone this morning  Disposition Plan:  Status is: Inpatient  Remains inpatient appropriate because:Unsafe d/c plan and Inpatient level of care appropriate due to severity of illness   Dispo:  Patient From: Home  Planned Disposition: Home with Health Care Svc  Expected discharge date: 11/08/2020  Medically stable for discharge: No   Consultants:   none  Procedures:   None  Antimicrobials:   Ceftriaxone 12/31>>  Azithromycin 1/1>>    Subjective: Patient seen and examined bedside, resting comfortably.  States overall feels much better than yesterday.  Concerned that she has not walked, although discussed with patient that she has been seen by physical and Occupational Therapy.  Discussed with nursing this morning, will have patient perform walk test and observe ambulatory status.  No questions or concerns at this time.  Denies headache, no visual changes, no chest pain, no palpitations, no shortness of breath, no abdominal pain, no weakness, no fatigue, no paresthesias.  No acute events overnight per nurse staff.  Objective: Vitals:   11/06/20 2239 11/06/20 2249 11/07/20 0738 11/07/20 0746  BP: (!) 145/61   130/60  Pulse:  75    Resp: 20 (!) 25    Temp:      TempSrc:      SpO2: 98% 97% 97%   Weight:      Height:        Intake/Output Summary (Last 24  hours) at 11/07/2020 1038 Last data filed at 11/06/2020 1145 Gross per 24 hour  Intake 340 ml  Output --  Net 340 ml   Filed Weights   11/03/20 1014 11/04/20 0457 11/05/20 0338  Weight: 90 kg 91 kg 87.5 kg    Examination:  General exam: Appears calm and comfortable  Respiratory system: Mild late expiratory wheezing bilateral bases, otherwise clear to auscultation, normal respiratory effort, on 2 L nasal cannula which is her baseline Cardiovascular system: S1 & S2 heard, RRR. No JVD, murmurs, rubs, gallops or clicks.  Trace pedal edema. Gastrointestinal system: Abdomen is nondistended, soft and nontender. No organomegaly or masses felt. Normal bowel sounds heard. Central nervous system: Alert and oriented. No focal neurological deficits. Extremities: Symmetric 5 x 5 power. Skin: No rashes, lesions or ulcers Psychiatry: Judgement and insight appear normal. Mood & affect appropriate.     Data Reviewed: I have personally reviewed following labs and imaging studies  CBC: Recent Labs  Lab 11/03/20 1019 11/03/20 1243 11/03/20 1835 11/04/20 0132 11/05/20 0553 11/06/20 0050 11/07/20 0328  WBC 14.2*  --   --  8.2 12.8* 9.0 9.3  NEUTROABS 12.7*  --   --   --   --   --   --   HGB 11.2*   < > 12.2 10.1* 10.7* 11.3* 11.3*  HCT 38.2   < > 36.0 32.8* 34.5* 34.6* 36.4  MCV 96.0  --   --  92.7 90.6 88.0 89.9  PLT 217  --   --  179 209 224 215   < > = values in this interval not displayed.   Basic Metabolic Panel: Recent Labs  Lab 11/03/20 1019 11/03/20 1243 11/03/20 1248 11/03/20 1835 11/04/20 0132 11/05/20 0553 11/06/20 0050 11/07/20 0328  NA 134* 134*   < > 133* 137 140 135 135  K 5.9* 5.7*   < > 5.2* 5.4* 4.0 4.8 5.0  CL 95* 94*  --   --  97* 96* 92* 90*  CO2 27  --   --   --  29 29 31  33*  GLUCOSE 331* 302*  --   --  285* 210* 326* 336*  BUN 47* 52*  --   --  58* 61* 53* 50*  CREATININE 1.78* 1.70*  --   --  2.04* 1.66* 1.49* 1.51*  CALCIUM 8.8*  --   --   --  8.9 9.2 9.2  9.3  MG  --   --   --   --   --  2.3  --   --    < > = values in this interval not displayed.   GFR: Estimated Creatinine Clearance: 28.1 mL/min (A) (by C-G formula based on SCr of 1.51 mg/dL (H)). Liver Function Tests: Recent Labs  Lab 11/03/20 1019 11/05/20 0553 11/06/20 0050 11/07/20 0328  AST 55* 30 34 26  ALT 57* 41 36 35  ALKPHOS 77 57 67 58  BILITOT 0.6 0.7 0.7 0.4  PROT 5.9* 5.4* 6.1* 5.7*  ALBUMIN 3.5 3.2* 3.3* 3.1*   No results for input(s): LIPASE, AMYLASE in the last 168 hours. Recent Labs  Lab 11/03/20 1100  AMMONIA 28   Coagulation Profile: No results for input(s): INR, PROTIME in the last 168 hours. Cardiac Enzymes: Recent Labs  Lab 11/03/20 1019  CKTOTAL 64   BNP (last 3 results) No results for input(s): PROBNP in the last 8760 hours. HbA1C: No results for input(s): HGBA1C in the last 72 hours. CBG: Recent Labs  Lab 11/06/20 1145 11/06/20 1657 11/06/20 2024 11/06/20 2339 11/07/20 0745  GLUCAP 273* 355* 309* 297* 260*   Lipid Profile: No results for input(s): CHOL, HDL, LDLCALC, TRIG, CHOLHDL, LDLDIRECT in the last 72 hours. Thyroid Function Tests: No results for input(s): TSH, T4TOTAL, FREET4, T3FREE, THYROIDAB in the last 72 hours. Anemia Panel: No results for input(s): VITAMINB12, FOLATE, FERRITIN, TIBC, IRON, RETICCTPCT in the last 72 hours. Sepsis Labs: No results for input(s): PROCALCITON, LATICACIDVEN in the last 168 hours.  Recent Results (from the past 240 hour(s))  Resp Panel by RT-PCR (Flu A&B, Covid) Nasopharyngeal Swab     Status: None   Collection Time: 11/03/20 10:26 AM   Specimen: Nasopharyngeal Swab; Nasopharyngeal(NP) swabs in vial transport medium  Result Value Ref Range Status   SARS Coronavirus 2 by RT PCR NEGATIVE NEGATIVE Final    Comment: (NOTE) SARS-CoV-2 target nucleic acids are NOT DETECTED.  The SARS-CoV-2 RNA is generally detectable in upper respiratory specimens during the acute phase of infection. The  lowest concentration of SARS-CoV-2 viral copies this assay can detect is 138 copies/mL. A negative result does not preclude SARS-Cov-2 infection and should not be used as the sole basis for treatment or other patient management decisions. A negative result may occur with  improper specimen collection/handling, submission of specimen other than nasopharyngeal swab, presence of viral mutation(s) within the areas targeted by this assay, and inadequate number of viral copies(<138 copies/mL). A negative result must be combined with  clinical observations, patient history, and epidemiological information. The expected result is Negative.  Fact Sheet for Patients:  EntrepreneurPulse.com.au  Fact Sheet for Healthcare Providers:  IncredibleEmployment.be  This test is no t yet approved or cleared by the [place] FDA and  has been authorized for detection and/or diagnosis of SARS-CoV-2 by FDA under an Emergency Use Authorization (EUA). This EUA will remain  in effect (meaning this test can be used) for the duration of the COVID-19 declaration under Section 564(b)(1) of the Act, 21 U.S.C.section 360bbb-3(b)(1), unless the authorization is terminated  or revoked sooner.       Influenza A by PCR NEGATIVE NEGATIVE Final   Influenza B by PCR NEGATIVE NEGATIVE Final    Comment: (NOTE) The Xpert Xpress SARS-CoV-2/FLU/RSV plus assay is intended as an aid in the diagnosis of influenza from Nasopharyngeal swab specimens and should not be used as a sole basis for treatment. Nasal washings and aspirates are unacceptable for Xpert Xpress SARS-CoV-2/FLU/RSV testing.  Fact Sheet for Patients: EntrepreneurPulse.com.au  Fact Sheet for Healthcare Providers: IncredibleEmployment.be  This test is not yet approved or cleared by the [place] FDA and has been authorized for detection and/or diagnosis of SARS-CoV-2 by FDA under  an Emergency Use Authorization (EUA). This EUA will remain in effect (meaning this test can be used) for the duration of the COVID-19 declaration under Section 564(b)(1) of the Act, 21 U.S.C. section 360bbb-3(b)(1), unless the authorization is terminated or revoked.  Performed at [facility], [place] [address] [place], [place] [address]   Urine culture     Status: Abnormal   Collection Time: 11/03/20  1:17 PM   Specimen: Urine, Catheterized  Result Value Ref Range Status   Specimen Description URINE, CATHETERIZED  Final   Special Requests   Final    NONE Performed at [facility], [place] [address], [place], [place] [address]    Culture >=100,000 COLONIES/mL PROTEUS VULGARIS (A)  Final   Report Status 11/05/2020 FINAL  Final   Organism ID, Bacteria PROTEUS VULGARIS (A)  Final      Susceptibility   Proteus vulgaris - MIC*    AMPICILLIN >=32 RESISTANT Resistant     CEFAZOLIN >=64 RESISTANT Resistant     CEFEPIME <=0.12 SENSITIVE Sensitive     CIPROFLOXACIN <=0.25 SENSITIVE Sensitive     GENTAMICIN <=1 SENSITIVE Sensitive     IMIPENEM 2 SENSITIVE Sensitive     NITROFURANTOIN >=512 RESISTANT Resistant     TRIMETH/SULFA <=20 SENSITIVE Sensitive     AMPICILLIN/SULBACTAM 4 SENSITIVE Sensitive     PIP/TAZO <=4 SENSITIVE Sensitive     * >=100,000 COLONIES/mL PROTEUS VULGARIS         Radiology Studies: No results found.      Scheduled Meds: . allopurinol  100 mg Oral Daily  . amLODipine  5 mg Oral q morning - 10a  . aspirin EC  81 mg Oral q morning - 10a  . atorvastatin  40 mg Oral q morning - 10a  . azithromycin  250 mg Oral Daily  . docusate sodium  100 mg Oral BID  . enoxaparin (LOVENOX) injection  30 mg Subcutaneous Q24H  . feeding supplement (GLUCERNA SHAKE)  237 mL Oral TID BM  . furosemide  40 mg Oral BID  . insulin aspart  0-15 Units Subcutaneous TID WC  . insulin aspart  0-5 Units Subcutaneous QHS  . insulin aspart  5 Units Subcutaneous TID WC  .  insulin glargine  25 Units Subcutaneous  QHS  . ipratropium-albuterol  3 mL Nebulization BID  . mometasone-formoterol  2 puff Inhalation BID  . multivitamin with minerals  1 tablet Oral Daily  . predniSONE  40 mg Oral Q breakfast  . sodium chloride flush  3 mL Intravenous Q12H   Continuous Infusions: . cefTRIAXone (ROCEPHIN)  IV 1 g (11/07/20 1010)     LOS: 4 days    Time spent: 38 minutes spent on chart review, discussion with nursing staff, consultants, updating family and interview/physical exam; more than 50% of that time was spent in counseling and/or coordination of care.    Valdez Brannan J [place] DO [facility] Available via Epic secure chat 7am-7pm After these hours, please refer to coverage provider listed on [email or web address] 11/07/2020, 10:38 AM

## 2020-11-07 NOTE — Progress Notes (Signed)
Physical Therapy Treatment Patient Details Name: [name] MRN: [id number] DOB: 03/04/1931 Today's Date: 11/07/2020    History of Present Illness 87 y.o. female admitted 12/31 with SOB and AMS. PMHx: obesity, back pain; HTN; HLD; DM; COPD on 2L [place] O2; CAD; stage 3 CKD; and chronic diastolic CHF, L THA    PT Comments    Pt with excellent progression in mobility this session able to walk in room on 2L with maintained SpO2 94% and HR 87. Pt with improved cognition and able to perform functional mobility for return home. Encouraged to be OOB for meals and continue mobility with nursing during the day.    Follow Up Recommendations  Home health PT;Supervision/Assistance - 24 hour     Equipment Recommendations  None recommended by PT    Recommendations for Other Services       Precautions / Restrictions Precautions Precautions: Fall    Mobility  Bed Mobility Overal bed mobility: Modified Independent Bed Mobility: Supine to Sit     Supine to sit: HOB elevated     General bed mobility comments: HOB 20 degrees with use of rail pt able to pivot to EOB without assist  Transfers Overall transfer level: Needs assistance   Transfers: Sit to/from Stand Sit to Stand: Min guard         General transfer comment: guarding for safety and cues for hand placement with pt able to stand from bed and recliner  Ambulation/Gait Ambulation/Gait assistance: Min guard Gait Distance (Feet): 30 Feet Assistive device: Rolling walker (2 wheeled) Gait Pattern/deviations: Step-through pattern;Decreased stride length;Trunk flexed   Gait velocity interpretation: <1.8 ft/sec, indicate of risk for recurrent falls General Gait Details: cues for posture and safety. Pt initially walked 3' then after seated rest and HEP was able to walk additional 30' with cautious gait and reliance on RW   Stairs             Wheelchair Mobility    Modified Rankin (Stroke Patients Only)       Balance  Overall balance assessment: Needs assistance   Sitting balance-Leahy Scale: Good Sitting balance - Comments: pt able to sit EOB without assist   Standing balance support: Bilateral upper extremity supported Standing balance-Leahy Scale: Poor Standing balance comment: RW for standing and gait                            Cognition Arousal/Alertness: Awake/alert Behavior During Therapy: WFL for tasks assessed/performed Overall Cognitive Status: Within Functional Limits for tasks assessed                                        Exercises General Exercises - Lower Extremity Long Arc Quad: AROM;Both;Seated;15 reps Hip Flexion/Marching: AROM;Both;Seated;15 reps    General Comments        Pertinent Vitals/Pain Pain Assessment: 0-10 Pain Score: 4  Pain Location: right knee Pain Descriptors / Indicators: Aching Pain Intervention(s): Limited activity within patient's tolerance;Monitored during session;Repositioned    Home Living                      Prior Function            PT Goals (current goals can now be found in the care plan section) Progress towards PT goals: Progressing toward goals    Frequency    Min 3X/week  PT Plan Current plan remains appropriate    Co-evaluation              AM-PAC PT "6 Clicks" Mobility   Outcome Measure  Help needed turning from your back to your side while in a flat bed without using bedrails?: None Help needed moving from lying on your back to sitting on the side of a flat bed without using bedrails?: A Little Help needed moving to and from a bed to a chair (including a wheelchair)?: A Little Help needed standing up from a chair using your arms (e.g., wheelchair or bedside chair)?: A Little Help needed to walk in hospital room?: A Little Help needed climbing 3-5 steps with a railing? : A Lot 6 Click Score: 18    End of Session Equipment Utilized During Treatment: Gait  belt;Oxygen Activity Tolerance: Patient tolerated treatment well Patient left: in chair;with call bell/phone within reach;with chair alarm set;with nursing/sitter in room Nurse Communication: Mobility status PT Visit Diagnosis: Other abnormalities of gait and mobility (R26.89);Difficulty in walking, not elsewhere classified (R26.2);Muscle weakness (generalized) (M62.81)     Time: 4492-0100 PT Time Calculation (min) (ACUTE ONLY): 20 min  Charges:  $Gait Training: 8-22 mins                     Merryl Hacker, PT Acute Rehabilitation Services Pager: [phone] Office: [phone]    Natayah Warmack B Diarra Kos 11/07/2020, 1:07 PM

## 2020-11-07 NOTE — Plan of Care (Signed)
  Problem: Education: Goal: Knowledge of General Education information will improve Description Including pain rating scale, medication(s)/side effects and non-pharmacologic comfort measures Outcome: Progressing   

## 2020-11-07 NOTE — Plan of Care (Signed)

## 2020-11-07 NOTE — Progress Notes (Signed)
Inpatient Diabetes Program Recommendations  AACE/ADA: New Consensus Statement on Inpatient Glycemic Control (2015)  Target Ranges:  Prepandial:   less than 140 mg/dL      Peak postprandial:   less than 180 mg/dL (1-2 hours)      Critically ill patients:  140 - 180 mg/dL   Lab Results  Component Value Date   GLUCAP 238 (H) 11/07/2020   HGBA1C 7.9 (H) 11/04/2020    Review of Glycemic Control Results for WILHEMINA, GRALL (MRN [id number]) as of 11/07/2020 12:29  Ref. Range 11/06/2020 20:24 11/06/2020 23:39 11/07/2020 07:45 11/07/2020 12:23  Glucose-Capillary Latest Ref Range: 70 - 99 mg/dL 182 (H) 993 (H) 716 (H) 238 (H)   Diabetes history: Type 2 DM Outpatient Diabetes medications: Lantus 25 units QD, Novolog 3-8 units TID Current orders for Inpatient glycemic control: Novolog 5 units TID, Lantus 25 units QD, Novolog 0-15 units TID, Novolog 0-5 units QHS Prednisone 40 mg QAM (starting 1/5)  Inpatient Diabetes Program Recommendations:    Consider increasing Lantus to 30 units QD.   Thanks, Lujean Rave, MSN, RNC-OB Diabetes Coordinator [phone] (8a-5p)

## 2020-11-08 DIAGNOSIS — R29818 Other symptoms and signs involving the nervous system: Secondary | ICD-10-CM | POA: Diagnosis present

## 2020-11-08 DIAGNOSIS — R0689 Other abnormalities of breathing: Secondary | ICD-10-CM | POA: Diagnosis present

## 2020-11-08 DIAGNOSIS — J9622 Acute and chronic respiratory failure with hypercapnia: Secondary | ICD-10-CM | POA: Diagnosis not present

## 2020-11-08 LAB — BASIC METABOLIC PANEL
Anion gap: 9 (ref 5–15)
BUN: 53 mg/dL — ABNORMAL HIGH (ref 8–23)
CO2: 35 mmol/L — ABNORMAL HIGH (ref 22–32)
Calcium: 9.2 mg/dL (ref 8.9–10.3)
Chloride: 94 mmol/L — ABNORMAL LOW (ref 98–111)
Creatinine, Ser: 1.48 mg/dL — ABNORMAL HIGH (ref 0.44–1.00)
GFR, Estimated: 34 mL/min — ABNORMAL LOW (ref >60)
Glucose, Bld: 96 mg/dL (ref 70–99)
Potassium: 4.5 mmol/L (ref 3.5–5.1)
Sodium: 138 mmol/L (ref 135–145)

## 2020-11-08 LAB — GLUCOSE, CAPILLARY
Glucose-Capillary: 90 mg/dL (ref 70–99)
Glucose-Capillary: 164 mg/dL — ABNORMAL HIGH (ref 70–99)

## 2020-11-08 MED ORDER — MOMETASONE FURO-FORMOTEROL FUM 200-5 MCG/ACT IN AERO: 2.0000 | INHALATION_SPRAY | Freq: Two times a day (BID) | RESPIRATORY_TRACT | 3 refills | Status: DC

## 2020-11-08 MED ORDER — SPIRIVA HANDIHALER 18 MCG IN CAPS: 18.0000 ug | ORAL_CAPSULE | Freq: Every day | RESPIRATORY_TRACT | 0 refills | Status: DC

## 2020-11-08 MED ORDER — PREDNISONE 10 MG PO TABS: ORAL_TABLET | ORAL | 0 refills | Status: AC

## 2020-11-08 MED ORDER — ALLOPURINOL 100 MG PO TABS: 100.0000 mg | ORAL_TABLET | Freq: Every day | ORAL | Status: DC

## 2020-11-08 NOTE — Discharge Instructions (Signed)
Heart Failure, Self Care Heart failure is a serious condition. This sheet explains things you need to do to take care of yourself at home. To help you stay as healthy as possible, you may be asked to change your diet, take certain medicines, and make other changes in your life. Your doctor may also give you more specific instructions. If you have problems or questions, call your doctor. What are the risks? Having heart failure makes it more likely for you to have some problems. These problems can get worse if you do not take good care of yourself. Problems may include:  Blood clotting problems. This may cause a stroke.  Damage to the kidneys, liver, or lungs.  Abnormal heart rhythms. Supplies needed:  Scale for weighing yourself.  Blood pressure monitor.  Notebook.  Medicines. How to care for yourself when you have heart failure Medicines Take over-the-counter and prescription medicines only as told by your doctor. Take your medicines every day.  Do not stop taking your medicine unless your doctor tells you to do so.  Do not skip any medicines.  Get your prescriptions refilled before you run out of medicine. This is important. Eating and drinking   Eat heart-healthy foods. Talk with a diet specialist (dietitian) to create an eating plan.  Choose foods that: ? Have no trans fat. ? Are low in saturated fat and cholesterol.  Choose healthy foods, such as: ? Fresh or frozen fruits and vegetables. ? Fish. ? Low-fat (lean) meats. ? Legumes, such as beans, peas, and lentils. ? Fat-free or low-fat dairy products. ? Whole-grain foods. ? High-fiber foods.  Limit salt (sodium) if told by your doctor. Ask your diet specialist to tell you which seasonings are healthy for your heart.  Cook in healthy ways instead of frying. Healthy ways of cooking include roasting, grilling, broiling, baking, poaching, steaming, and stir-frying.  Limit how much fluid you drink, if told by your  doctor. Alcohol use  Do not drink alcohol if: ? Your doctor tells you not to drink. ? Your heart was damaged by alcohol, or you have very bad heart failure. ? You are pregnant, may be pregnant, or are planning to become pregnant.  If you drink alcohol: ? Limit how much you use to:  0-1 drink a day for women.  0-2 drinks a day for men. ? Be aware of how much alcohol is in your drink. In the [place] one drink equals one 12 oz bottle of beer (355 mL), one 5 oz glass of wine (148 mL), or one 1 oz glass of hard liquor (44 mL). Lifestyle   Do not use any products that contain nicotine or tobacco, such as cigarettes, e-cigarettes, and chewing tobacco. If you need help quitting, ask your doctor. ? Do not use nicotine gum or patches before talking to your doctor.  Do not use illegal drugs.  Lose weight if told by your doctor.  Do physical activity if told by your doctor. Talk to your doctor before you begin an exercise if: ? You are an older adult. ? You have very bad heart failure.  Learn to manage stress. If you need help, ask your doctor.  Get rehab (rehabilitation) to help you stay independent and to help with your quality of life.  Plan time to rest when you get tired. Check weight and blood pressure   Weigh yourself every day. This will help you to know if fluid is building up in your body. ? Weigh yourself every morning   after you pee (urinate) and before you eat breakfast. ? Wear the same amount of clothing each time. ? Write down your daily weight. Give your record to your doctor.  Check and write down your blood pressure as told by your doctor.  Check your pulse as told by your doctor. Dealing with very hot and very cold weather  If it is very hot: ? Avoid activities that take a lot of energy. ? Use air conditioning or fans, or find a cooler place. ? Avoid caffeine and alcohol. ? Wear clothing that is loose-fitting, lightweight, and light-colored.  If it is very  cold: ? Avoid activities that take a lot of energy. ? Layer your clothes. ? Wear mittens or gloves, a hat, and a scarf when you go outside. ? Avoid alcohol. Follow these instructions at home:  Stay up to date with shots (vaccines). Get pneumococcal and flu (influenza) shots.  Keep all follow-up visits as told by your doctor. This is important. Contact a doctor if:  You gain weight quickly.  You have increasing shortness of breath.  You cannot do your normal activities.  You get tired easily.  You cough a lot.  You don't feel like eating or feel like you may vomit (nauseous).  You become puffy (swell) in your hands, feet, ankles, or belly (abdomen).  You cannot sleep well because it is hard to breathe.  You feel like your heart is beating fast (palpitations).  You get dizzy when you stand up. Get help right away if:  You have trouble breathing.  You or someone else notices a change in your behavior, such as having trouble staying awake.  You have chest pain or discomfort.  You pass out (faint). These symptoms may be an emergency. Do not wait to see if the symptoms will go away. Get medical help right away. Call your local emergency services (911 in the [place] Do not drive yourself to the hospital. Summary  Heart failure is a serious condition. To care for yourself, you may have to change your diet, take medicines, and make other lifestyle changes.  Take your medicines every day. Do not stop taking them unless your doctor tells you to do so.  Eat heart-healthy foods, such as fresh or frozen fruits and vegetables, fish, lean meats, legumes, fat-free or low-fat dairy products, and whole-grain or high-fiber foods.  Ask your doctor if you can drink alcohol. You may have to stop alcohol use if you have very bad heart failure.  Contact your doctor if you gain weight quickly or feel that your heart is beating too fast. Get help right away if you pass out, or have chest pain  or trouble breathing. This information is not intended to replace advice given to you by your health care provider. Make sure you discuss any questions you have with your health care provider. Document Revised: 02/02/2019 Document Reviewed: 02/03/2019 Elsevier Patient Education  2020 [employer]   Heart Failure, Diagnosis  Heart failure means that your heart is not able to pump blood in the right way. This makes it hard for your body to work well. Heart failure is usually a long-term (chronic) condition. You must take good care of yourself and follow your treatment plan from your doctor. What are the causes? This condition may be caused by:  High blood pressure.  Build up of cholesterol and fat in the arteries.  Heart attack. This injures the heart muscle.  Heart valves that do not open and close   properly.  Damage of the heart muscle. This is also called cardiomyopathy.  Lung disease.  Abnormal heart rhythms. What increases the risk? The risk of heart failure goes up as a person ages. This condition is also more likely to develop in people who:  Are overweight.  Are female.  Smoke or chew tobacco.  Abuse alcohol or illegal drugs.  Have taken medicines that can damage the heart.  Have diabetes.  Have abnormal heart rhythms.  Have thyroid problems.  Have low blood counts (anemia). What are the signs or symptoms? Symptoms of this condition include:  Shortness of breath.  Coughing.  Swelling of the feet, ankles, legs, or belly.  Losing weight for no reason.  Trouble breathing.  Waking from sleep because of the need to sit up and get more air.  Rapid heartbeat.  Being very tired.  Feeling dizzy, or feeling like you may pass out (faint).  Having no desire to eat.  Feeling like you may vomit (nauseous).  Peeing (urinating) more at night.  Feeling confused. How is this treated?     This condition may be treated with:  Medicines. These can be  given to treat blood pressure and to make the heart muscles stronger.  Changes in your daily life. These may include eating a healthy diet, staying at a healthy body weight, quitting tobacco and illegal drug use, or doing exercises.  Surgery. Surgery can be done to open blocked valves, or to put devices in the heart, such as pacemakers.  A donor heart (heart transplant). You will receive a healthy heart from a donor. Follow these instructions at home:  Treat other conditions as told by your doctor. These may include high blood pressure, diabetes, thyroid disease, or abnormal heart rhythms.  Learn as much as you can about heart failure.  Get support as you need it.  Keep all follow-up visits as told by your doctor. This is important. Summary  Heart failure means that your heart is not able to pump blood in the right way.  This condition is caused by high blood pressure, heart attack, or damage of the heart muscle.  Symptoms of this condition include shortness of breath and swelling of the feet, ankles, legs, or belly. You may also feel very tired or feel like you may vomit.  You may be treated with medicines, surgery, or changes in your daily life.  Treat other health conditions as told by your doctor. This information is not intended to replace advice given to you by your health care provider. Make sure you discuss any questions you have with your health care provider. Document Revised: 01/08/2019 Document Reviewed: 01/08/2019 Elsevier Patient Education  2020 [employer]  

## 2020-11-08 NOTE — TOC Transition Note (Signed)
Transition of Care [facility]) - CM/SW Discharge Note   Patient Details  Name: [name] MRN: [id number] Date of Birth: September 09, 1931  [facility] CM/SW Contact:  Lorri Frederick, LCSW Phone Number: 11/08/2020, 11:59 AM   Clinical Narrative: Pt discharging home with son.  See note from 11/07/20.  Son has declined services.  O2 in place.  Hospital discharge appt made.      Final next level of care: Home/Self Care Barriers to Discharge: Barriers Resolved   Patient Goals and CMS Choice   CMS Medicare.gov Compare Post Acute Care list provided to:: Patient Choice offered to / list presented to : Patient  Discharge Placement                       Discharge Plan and Services                DME Arranged: Oxygen DME Agency: [employer] Date DME Agency Contacted: 11/07/20 Time DME Agency Contacted: [phone] Representative spoke with at DME Agency: Silvio Pate HH Arranged: Refused HH HH Agency: [facility] Date [facility] Agency Contacted: 11/06/20 Time HH Agency Contacted: [phone] Representative spoke with at [facility] Agency: Arline Asp  Social Determinants of Health (SDOH) Interventions     Readmission Risk Interventions Readmission Risk Prevention Plan 11/07/2020  Transportation Screening Complete  PCP or Specialist Appt within 3-5 Days Complete  HRI or Home Care Consult Patient refused  Social Work Consult for Recovery Care Planning/Counseling Complete  Palliative Care Screening Not Applicable  Some recent data might be hidden

## 2020-11-08 NOTE — Discharge Summary (Signed)
Physician Discharge Summary  [name] [id number] DOB: August 21, 1931 DOA: 11/03/2020  PCP: Hoyt Koch, MD  Admit date: 11/03/2020 Discharge date: 11/08/2020  Admitted From: Home   Disposition: Home   Recommendations for Outpatient Follow-up:  1. Follow up with PCP in 1-2 weeks 2. Outpatient referral to pulmonology for further management of COPD/chronic respiratory failure as well as need for sleep study for suspected OSA 3. Started on [facility] inhaler and Spiriva on discharge 4. Continue prednisone taper 5. Please obtain BMP in one week to assess renal function 6. Recommend TSH 6 weeks  Home Health: Family declined home health services due to current Covid crisis Equipment/Devices: Oxygen, 3 L per nasal cannula which is her baseline  Discharge Condition: Stable CODE STATUS: DNR Diet recommendation: Heart healthy/consistent carbohydrate diet  History of present illness:  [name] is an 87 year old female with past medical history significant for obesity, back pain, essential hypertension, hyperlipidemia, type 2 diabetes mellitus, COPD on 2 L nasal cannula, CAD, CKD stage III, chronic diastolic congestive heart failure who presented to the ED via EMS with progressive shortness of breath.  States recent cough and congestion and vaccinated and boosted against Covid-19.  Also with history of recurrent UTIs with complaints of dysuria.  Reports compliance with her home furosemide and COPD medications.  Son also reported that she has been more confused unable to take care of herself for the last few days.  Evaluation the ED revealed hypercarbia and respiratory acidosis.  Also urinalysis consistent with UTI.  PaCO2 81.  Patient was treated with duo nebs, albuterol and Lasix.  Covid-19 PCR negative.  BNP elevated 970 but chest x-ray clear.  Creatinine 1.78 with a baseline of 1.3 on mission.  Patient placed on BiPAP.  Started on ceftriaxone.  Hospital service consulted for further  evaluation and management of acute hypoxic/hypercarbic respiratory failure secondary to COPD exacerbation and UTI.    Hospital course:  Acute on chronic hypoxic/hypercarbic respiratory failure COPD exacerbation Suspect obstructive sleep apnea Patient presenting to ED with progressive confusion, shortness of breath was found to have elevated PaCO2 of 81 on arrival and was hypoxic on her 2 L nasal cannula.  Chest x-ray with no pulmonary edema findings with bibasilar atelectasis.  Patient initially required BiPAP due to her respiratory distress and hypoxia which was slowly transitioned back to her nasal cannula.  Patient completed 5-day course of azithromycin and initially started on IV Solu-Medrol which was the escalated to prednisone which she will continue a slow taper on discharge.  Concerned that she likely has suspected sleep apnea as etiology and recommend sleep study outpatient.  Ambulatory referral to pulmonology placed.  Concern for undiagnosed OSA Patient presenting with significant respiratory acidosis with a pH of 7.2 and PCO2 of 82.  Would benefit from outpatient pulmonology versus neurology evaluation with sleep study. Continue supplemental oxygen, currently on baseline 2 L per nasal cannula.  Referral for outpatient sleep study placed to pulmonology.  Proteus vulgaris UTI, POA Urine culture with greater than 100K colonies Proteus vulgaris with resistance to ampicillin, cefazolin and nitrofurantoin.  Completed 5-day course of ceftriaxone on 11/08/2019  Acute renal failure on CKD stage IIIb Etiology likely secondary to prerenal azotemia/dehydration in the setting of poor oral intake in the days preceding hospitalization.  Baseline creatinine 1.3 with a GFR of 36.  Patient initially started on IV furosemide which was transitioned back to her home dose of furosemide 80 mg p.o. daily.  Recommend maintaining daily weights.  Subclinical hyperthyroidism  TSH 0.259, free T4 0.82.  Repeat TSH  outpatient in 6 weeks  Hyperkalemia: Resolved Patient presenting with serum potassium of 5.7, likely secondary to acute renal failure. Received IV Lasix, albuterol with resolution.  Recommend BMP at next PCP/specialist visit.   Chronic diastolic congestive heart failure BNP on admission elevated 970 but chest x-ray shows no overt pulmonary edema and does note bibasilar atelectasis.  Patient was initially started on IV furosemide 40 mg twice daily, now transition back to oral.  Essential hypertension Amlodipine 5 mg p.o. daily, metoprolol tartrate 25 mg p.o. twice daily, hydralazine 25 mg p.o. twice daily.  Continue aspirin and statin  Type 2 diabetes mellitus Hemoglobin A1c 7.9 on 11/03/2020.  Includes Lantus 25 units obviously daily, NovoLog 3-8 units subcutaneously 3 times daily AC.  Outpatient follow-up with PCP for further management.  HLD: Continue atorvastatin 40 mg p.o. daily  Gout: Allopurinol 100 mg p.o. daily  Weakness, debility, deconditioning Patient seen and evaluated by PT/OT with recommendations of home health on discharge.  Family declines home health due to current Covid-19 viral crisis.  Discharge Diagnoses:  Principal Problem:   Acute on chronic respiratory failure with hypercapnia (HCC) Active Problems:   Hypertension, essential   Mixed dyslipidemia   Diabetes with neurologic complications (HCC)   Decompensated COPD (chronic obstructive pulmonary disease) (HCC)   Acute on chronic diastolic CHF (congestive heart failure) (HCC)   CKD (chronic kidney disease) stage 3, GFR 30-59 ml/min (HCC)   Gout   Suspected sleep apnea    Discharge Instructions  Discharge Instructions    Ambulatory referral to Pulmonology   Complete by: As directed    Also needs sleep study for suspected OSA   Reason for referral: Asthma/COPD   Call MD for:  difficulty breathing, headache or visual disturbances   Complete by: As directed    Call MD for:  hives   Complete by: As  directed    Call MD for:  persistant dizziness or light-headedness   Complete by: As directed    Call MD for:  persistant nausea and vomiting   Complete by: As directed    Call MD for:  severe uncontrolled pain   Complete by: As directed    Call MD for:  temperature >100.4   Complete by: As directed    Diet - low sodium heart healthy   Complete by: As directed    Increase activity slowly   Complete by: As directed      Allergies as of 11/08/2020      Reactions   Atenolol Other (See Comments)   weakness   Chlorhexidine    Codeine Nausea And Vomiting   Hydrocodone is ok   Lactose Intolerance (gi) Diarrhea   Lodine [etodolac] Other (See Comments)   dizziness   Sulfa Drugs Cross Reactors Nausea Only   Sulfamethoxazole Nausea And Vomiting   Benzonatate Nausea And Vomiting   Escitalopram Oxalate Other (See Comments)   mild hallucinations   Metformin Nausea And Vomiting   Reaction to IR and XL   Oxycodone-acetaminophen Other (See Comments)   Percocet does not relieve pts' pain   Pregabalin Other (See Comments)   Unknown reaction      Medication List    STOP taking these medications   nitrofurantoin (macrocrystal-monohydrate) 100 MG capsule Commonly known as: Macrobid     TAKE these medications   acetaminophen 500 MG tablet Commonly known as: TYLENOL Take 500 mg by mouth daily as needed for mild pain or headache.  allopurinol 100 MG tablet Commonly known as: ZYLOPRIM Take 1 tablet (100 mg total) by mouth daily. What changed: how much to take   ALPRAZolam 0.25 MG tablet Commonly known as: XANAX Take 1 tablet (0.25 mg total) by mouth at bedtime as needed for anxiety. What changed: when to take this   amLODipine 5 MG tablet Commonly known as: NORVASC Take 1 tablet (5 mg total) by mouth every morning.   aspirin EC 81 MG tablet Take 81 mg by mouth every morning.   atorvastatin 40 MG tablet Commonly known as: LIPITOR Take 40 mg by mouth every morning.    diclofenac Sodium 1 % Gel Commonly known as: VOLTAREN Apply 4 g topically 4 (four) times daily. To affected joint. What changed:   when to take this  reasons to take this  additional instructions   ergocalciferol 1.25 MG (50000 UT) capsule Commonly known as: VITAMIN D2 Take 50,000 Units by mouth every Friday.   fluticasone 50 MCG/ACT nasal spray Commonly known as: FLONASE Place 2 sprays into both nostrils daily.   furosemide 80 MG tablet Commonly known as: LASIX TAKE 1 TABLET BY MOUTH EVERY DAY   gabapentin 100 MG capsule Commonly known as: NEURONTIN TAKE 1 CAPSULE BY MOUTH THREE TIMES A DAY What changed: See the new instructions.   hydrALAZINE 25 MG tablet Commonly known as: APRESOLINE TAKE 1 TABLET BY MOUTH TWICE A DAY   insulin aspart 100 UNIT/ML injection Commonly known as: novoLOG Inject 3-8 Units into the skin 3 (three) times daily with meals. Based on a sliding scale.   insulin glargine 100 UNIT/ML injection Commonly known as: Lantus Inject 0.25 mLs (25 Units total) into the skin at bedtime.   LACTASE PO Take 1 tablet by mouth 3 (three) times daily as needed (if meal includes cheese or other dairy).   metoprolol tartrate 25 MG tablet Commonly known as: LOPRESSOR Take 25 mg by mouth 2 (two) times daily.   mometasone-formoterol 200-5 MCG/ACT Aero Commonly known as: DULERA Inhale 2 puffs into the lungs 2 (two) times daily.   multivitamin with minerals Tabs tablet Take 1 tablet by mouth daily.   ONE TOUCH ULTRA TEST test strip Generic drug: glucose blood 1 each by Other route as directed.   oxyCODONE-acetaminophen 5-325 MG tablet Commonly known as: PERCOCET/ROXICET Take 1 tablet by mouth every 4 (four) hours as needed for severe pain.   OXYGEN Inhale 1.5 Lipoprotein Lipase Releasing Units into the lungs. 1.5L to 2L   Polyethyl Glycol-Propyl Glycol 0.4-0.3 % Soln Apply 2 drops to eye daily as needed (dry eyes).   predniSONE 10 MG  tablet Commonly known as: DELTASONE Take 3 tablets (30 mg total) by mouth daily for 2 days, THEN 2 tablets (20 mg total) daily for 2 days, THEN 1 tablet (10 mg total) daily for 2 days. Start taking on: November 09, 2020   PROBIOTIC DAILY PO Take 1 tablet by mouth daily.   RELION INSULIN SYR 0.3ML/31G 31G X 5/16" 0.3 ML Misc Generic drug: Insulin Syringe-Needle U-100 1 each by Other route as needed (insulin).   Spiriva HandiHaler 18 MCG inhalation capsule Generic drug: tiotropium Place 1 capsule (18 mcg total) into inhaler and inhale daily.       Follow-up Information    [facility] Follow up.   Specialty: Home Health Services Why: Your home health has been set up with [facility]. The agency will contact you later this week to set up start of service. If you have any  questions please call number listed above.  Contact information: [place] [address] [phone]        Hoyt Koch, MD. Go on 11/15/2020.   Specialty: Internal Medicine Why: Please attend your hospital discharge appointment with Dr Sharlet Salina on Wednesday, 11/15/20, at 11am.  Contact information: [place] [address] [phone]              Allergies  Allergen Reactions  . Atenolol Other (See Comments)    weakness  . Chlorhexidine   . Codeine Nausea And Vomiting    Hydrocodone is ok  . Lactose Intolerance (Gi) Diarrhea  . Lodine [Etodolac] Other (See Comments)    dizziness  . Sulfa Drugs Cross Reactors Nausea Only  . Sulfamethoxazole Nausea And Vomiting  . Benzonatate Nausea And Vomiting  . Escitalopram Oxalate Other (See Comments)    mild hallucinations  . Metformin Nausea And Vomiting    Reaction to IR and XL  . Oxycodone-Acetaminophen Other (See Comments)    Percocet does not relieve pts' pain  . Pregabalin Other (See Comments)    Unknown reaction    Consultations:  None   Procedures/Studies: CT Head Wo  Contrast  Result Date: 11/03/2020 CLINICAL DATA:  Found down on floor. Confusion for 2 days. History of UTI. EXAM: CT HEAD WITHOUT CONTRAST TECHNIQUE: Contiguous axial images were obtained from the base of the skull through the vertex without intravenous contrast. COMPARISON:  09/20/2019 head CT. FINDINGS: Brain: Motion degraded scan, limiting assessment. No evidence of parenchymal hemorrhage or extra-axial fluid collection. No mass lesion, mass effect, or midline shift. No CT evidence of acute infarction. Nonspecific mild to moderate subcortical and periventricular white matter hypodensity, most in keeping with chronic small vessel ischemic change. Cerebral volume is age appropriate. No ventriculomegaly. Vascular: No acute abnormality. Skull: No evidence of calvarial fracture. Sinuses/Orbits: The visualized paranasal sinuses are essentially clear. Other:  The mastoid air cells are unopacified. IMPRESSION: Limited motion degraded scan. No evidence of acute intracranial abnormality. No evidence of calvarial fracture. Mild-to-moderate chronic small vessel ischemic changes in the cerebral white matter. Electronically Signed   By: Ilona Sorrel M.D.   On: 11/03/2020 11:29   CT Cervical Spine Wo Contrast  Result Date: 11/03/2020 CLINICAL DATA:  Found down at home.  Confusion for 2 days. EXAM: CT CERVICAL SPINE WITHOUT CONTRAST TECHNIQUE: Multidetector CT imaging of the cervical spine was performed without intravenous contrast. Multiplanar CT image reconstructions were also generated. COMPARISON:  09/18/2019 cervical spine CT. FINDINGS: Motion degraded scan, limiting assessment. Alignment: Straightening of the cervical spine. No facet subluxation. Dens is well positioned between the lateral masses of C1. Mild 3 mm anterolisthesis at C5-6, unchanged. Skull base and vertebrae: No acute fracture. Chronic ununited odontoid fracture with new minimal 2 mm distraction of the tip of the dens. No primary bone lesion or  focal pathologic process. Soft tissues and spinal canal: No prevertebral edema. No visible canal hematoma. Disc levels: Marked multilevel degenerative disc disease throughout the cervical spine, most prominent at C5-6. Marked bilateral facet arthropathy. Moderate degenerative foraminal stenosis on the left at C3-4 and C4-5. Upper chest: New 7 mm medial apical left upper lobe ground-glass nodule (series 3/image 87). Other: Visualized mastoid air cells appear clear. No discrete thyroid nodules. No pathologically enlarged cervical nodes. IMPRESSION: 1. Motion degraded scan, limiting assessment. 2. No evidence of acute cervical spine fracture or facet subluxation. 3. Chronic ununited odontoid fracture with new minimal 2 mm distraction of  the tip of the dens, which may simply be due to differences in positioning. 4. Marked multilevel degenerative changes in the cervical spine as detailed. 5. New 7 mm medial apical left upper lobe ground-glass nodule. Initial follow-up with CT at 6-12 months is recommended to confirm persistence. If persistent, repeat CT is recommended every 2 years until 5 years of stability has been established. This recommendation follows the consensus statement: Guidelines for Management of Incidental Pulmonary Nodules Detected on CT Images: From the Fleischner Society 2017; Radiology 2017; 284:228-243. Electronically Signed   By: Ilona Sorrel M.D.   On: 11/03/2020 11:56   DG Chest Port 1 View  Result Date: 11/03/2020 CLINICAL DATA:  Shortness of breath and confusion. EXAM: PORTABLE CHEST 1 VIEW COMPARISON:  12/17/2019 FINDINGS: Stable mild cardiac enlargement. Bibasilar atelectasis present. No overt edema, pneumothorax or visualized pleural fluid. Stable old left-sided rib fractures. IMPRESSION: Bibasilar atelectasis. Electronically Signed   By: Aletta Edouard M.D.   On: 11/03/2020 10:39   ECHOCARDIOGRAM COMPLETE  Result Date: 11/03/2020    ECHOCARDIOGRAM REPORT   Patient Name:   TAHRA HOVE  [facility] Date of Exam: 11/03/2020 Medical Rec #:  [id number]    Height:       64.0 in Accession #:    [id number]   Weight:       198.4 lb Date of Birth:  1931/07/16     BSA:          1.950 m Patient Age:    57 years     BP:           111/84 mmHg Patient Gender: F            HR:           56 bpm. Exam Location:  Inpatient Procedure: 2D Echo, Color Doppler and Cardiac Doppler Indications:    CHF  History:        Patient has prior history of Echocardiogram examinations.  Sonographer:    NA Referring Phys: [place]  1. Left ventricular ejection fraction, by estimation, is 60 to 65%. The left ventricle has normal function. The left ventricle has no regional wall motion abnormalities. Left ventricular diastolic parameters are consistent with Grade I diastolic dysfunction (impaired relaxation).  2. Right ventricular systolic function is normal. The right ventricular size is normal. There is mildly elevated pulmonary artery systolic pressure. The estimated right ventricular systolic pressure is 0000000 mmHg.  3. Left atrial size was moderately dilated.  4. Right atrial size was mildly dilated.  5. The mitral valve is normal in structure. No evidence of mitral valve regurgitation. No evidence of mitral stenosis. Moderate mitral annular calcification.  6. The aortic valve is tricuspid. Aortic valve regurgitation is not visualized. Mild aortic valve sclerosis is present, with no evidence of aortic valve stenosis.  7. The inferior vena cava is normal in size with greater than 50% respiratory variability, suggesting right atrial pressure of 3 mmHg. FINDINGS  Left Ventricle: Left ventricular ejection fraction, by estimation, is 60 to 65%. The left ventricle has normal function. The left ventricle has no regional wall motion abnormalities. The left ventricular internal cavity size was normal in size. There is  no left ventricular hypertrophy. Left ventricular diastolic parameters are consistent with Grade I diastolic  dysfunction (impaired relaxation). Right Ventricle: The right ventricular size is normal. No increase in right ventricular wall thickness. Right ventricular systolic function is normal. There is mildly elevated pulmonary artery systolic pressure. The tricuspid regurgitant velocity  is 3.24  m/s, and with an assumed right atrial pressure of 3 mmHg, the estimated right ventricular systolic pressure is 0000000 mmHg. Left Atrium: Left atrial size was moderately dilated. Right Atrium: Right atrial size was mildly dilated. Pericardium: Trivial pericardial effusion is present. Mitral Valve: The mitral valve is normal in structure. There is mild calcification of the mitral valve leaflet(s). Moderate mitral annular calcification. No evidence of mitral valve regurgitation. No evidence of mitral valve stenosis. Tricuspid Valve: The tricuspid valve is normal in structure. Tricuspid valve regurgitation is trivial. Aortic Valve: The aortic valve is tricuspid. Aortic valve regurgitation is not visualized. Mild aortic valve sclerosis is present, with no evidence of aortic valve stenosis. Pulmonic Valve: The pulmonic valve was normal in structure. Pulmonic valve regurgitation is not visualized. Aorta: The aortic root is normal in size and structure. Venous: The inferior vena cava is normal in size with greater than 50% respiratory variability, suggesting right atrial pressure of 3 mmHg. IAS/Shunts: No atrial level shunt detected by color flow Doppler.  LEFT VENTRICLE PLAX 2D LVIDd:         4.70 cm  Diastology LVIDs:         2.90 cm  LV e' medial:    6.31 cm/s LV PW:         1.10 cm  LV E/e' medial:  16.3 LV IVS:        1.10 cm  LV e' lateral:   7.94 cm/s LVOT diam:     1.70 cm  LV E/e' lateral: 13.0 LV SV:         67 LV SV Index:   34 LVOT Area:     2.27 cm  RIGHT VENTRICLE RV S prime:     11.60 cm/s TAPSE (M-mode): 2.6 cm LEFT ATRIUM             Index       RIGHT ATRIUM           Index LA diam:        4.30 cm 2.21 cm/m  RA Area:      18.10 cm LA Vol (A2C):   54.5 ml 27.95 ml/m RA Volume:   47.50 ml  24.36 ml/m LA Vol (A4C):   85.3 ml 43.75 ml/m LA Biplane Vol: 69.3 ml 35.54 ml/m  AORTIC VALVE LVOT Vmax:   124.00 cm/s LVOT Vmean:  89.900 cm/s LVOT VTI:    0.294 m  AORTA Ao Root diam: 2.60 cm Ao Asc diam:  2.70 cm MITRAL VALVE                TRICUSPID VALVE MV Area (PHT): 3.65 cm     TR Peak grad:   42.0 mmHg MV Decel Time: 208 msec     TR Vmax:        324.00 cm/s MV E velocity: 103.00 cm/s MV A velocity: 112.00 cm/s  SHUNTS MV E/A ratio:  0.92         Systemic VTI:  0.29 m                             Systemic Diam: 1.70 cm Loralie Champagne MD Electronically signed by Loralie Champagne MD Signature Date/Time: 11/03/2020/6:02:35 PM    Final       Subjective: Patient seen and examined bedside, resting comfortably.  No complaints this morning.  Slept well overnight.  Shortness of breath now back at baseline.  Continues on baseline nasal  cannula 2-3 L/min.  Ready for discharge home.  Denies headache, no fever/chills/night sweats, no nausea/vomiting/diarrhea, no chest pain, no palpitations, no abdominal pain, no weakness, no fatigue, no paresthesias.  No acute events overnight per nursing staff.  Discharge Exam: Vitals:   11/08/20 0731 11/08/20 0743  BP: (!) 148/62   Pulse: 65 62  Resp: 17   Temp:    SpO2: 95% 100%   Vitals:   11/07/20 2314 11/08/20 0320 11/08/20 0731 11/08/20 0743  BP: (!) 111/38 (!) 145/57 (!) 148/62   Pulse: 68 63 65 62  Resp: 15 15 17    Temp: 98.3 F (36.8 C) 98.5 F (36.9 C)    TempSrc: Oral Oral    SpO2: 100% 100% 95% 100%  Weight:      Height:        General: Pt is alert, awake, not in acute distress Cardiovascular: RRR, S1/S2 +, no rubs, no gallops Respiratory: CTA bilaterally, no wheezing, no rhonchi, on 2 L nasal cannula with SPO2 100% Abdominal: Soft, NT, ND, bowel sounds + Extremities: no edema, no cyanosis    The results of significant diagnostics from this hospitalization (including  imaging, microbiology, ancillary and laboratory) are listed below for reference.     Microbiology: Recent Results (from the past 240 hour(s))  Resp Panel by RT-PCR (Flu A&B, Covid) Nasopharyngeal Swab     Status: None   Collection Time: 11/03/20 10:26 AM   Specimen: Nasopharyngeal Swab; Nasopharyngeal(NP) swabs in vial transport medium  Result Value Ref Range Status   SARS Coronavirus 2 by RT PCR NEGATIVE NEGATIVE Final    Comment: (NOTE) SARS-CoV-2 target nucleic acids are NOT DETECTED.  The SARS-CoV-2 RNA is generally detectable in upper respiratory specimens during the acute phase of infection. The lowest concentration of SARS-CoV-2 viral copies this assay can detect is 138 copies/mL. A negative result does not preclude SARS-Cov-2 infection and should not be used as the sole basis for treatment or other patient management decisions. A negative result may occur with  improper specimen collection/handling, submission of specimen other than nasopharyngeal swab, presence of viral mutation(s) within the areas targeted by this assay, and inadequate number of viral copies(<138 copies/mL). A negative result must be combined with clinical observations, patient history, and epidemiological information. The expected result is Negative.  Fact Sheet for Patients:  EntrepreneurPulse.com.au  Fact Sheet for Healthcare Providers:  IncredibleEmployment.be  This test is no t yet approved or cleared by the [place] FDA and  has been authorized for detection and/or diagnosis of SARS-CoV-2 by FDA under an Emergency Use Authorization (EUA). This EUA will remain  in effect (meaning this test can be used) for the duration of the COVID-19 declaration under Section 564(b)(1) of the Act, 21 U.S.C.section 360bbb-3(b)(1), unless the authorization is terminated  or revoked sooner.       Influenza A by PCR NEGATIVE NEGATIVE Final   Influenza B by PCR NEGATIVE  NEGATIVE Final    Comment: (NOTE) The Xpert Xpress SARS-CoV-2/FLU/RSV plus assay is intended as an aid in the diagnosis of influenza from Nasopharyngeal swab specimens and should not be used as a sole basis for treatment. Nasal washings and aspirates are unacceptable for Xpert Xpress SARS-CoV-2/FLU/RSV testing.  Fact Sheet for Patients: EntrepreneurPulse.com.au  Fact Sheet for Healthcare Providers: IncredibleEmployment.be  This test is not yet approved or cleared by the [place] FDA and has been authorized for detection and/or diagnosis of SARS-CoV-2 by FDA under an Emergency Use Authorization (EUA). This EUA will remain in  effect (meaning this test can be used) for the duration of the COVID-19 declaration under Section 564(b)(1) of the Act, 21 U.S.C. section 360bbb-3(b)(1), unless the authorization is terminated or revoked.  Performed at [facility], [address], [place], [place] [address]   Urine culture     Status: Abnormal   Collection Time: 11/03/20  1:17 PM   Specimen: Urine, Catheterized  Result Value Ref Range Status   Specimen Description URINE, CATHETERIZED  Final   Special Requests   Final    NONE Performed at [facility], [address], [place], [place] [address]    Culture >=100,000 COLONIES/mL PROTEUS VULGARIS (A)  Final   Report Status 11/05/2020 FINAL  Final   Organism ID, Bacteria PROTEUS VULGARIS (A)  Final      Susceptibility   Proteus vulgaris - MIC*    AMPICILLIN >=32 RESISTANT Resistant     CEFAZOLIN >=64 RESISTANT Resistant     CEFEPIME <=0.12 SENSITIVE Sensitive     CIPROFLOXACIN <=0.25 SENSITIVE Sensitive     GENTAMICIN <=1 SENSITIVE Sensitive     IMIPENEM 2 SENSITIVE Sensitive     NITROFURANTOIN >=512 RESISTANT Resistant     TRIMETH/SULFA <=20 SENSITIVE Sensitive     AMPICILLIN/SULBACTAM 4 SENSITIVE Sensitive     PIP/TAZO <=4 SENSITIVE Sensitive     * >=100,000 COLONIES/mL PROTEUS  VULGARIS     Labs: BNP (last 3 results) Recent Labs    12/10/19 2140 11/03/20 1019  BNP 531.2* 970.6*   Basic Metabolic Panel: Recent Labs  Lab 11/04/20 0132 11/05/20 0553 11/06/20 0050 11/07/20 0328 11/08/20 0351  NA 137 140 135 135 138  K 5.4* 4.0 4.8 5.0 4.5  CL 97* 96* 92* 90* 94*  CO2 29 29 31  33* 35*  GLUCOSE 285* 210* 326* 336* 96  BUN 58* 61* 53* 50* 53*  CREATININE 2.04* 1.66* 1.49* 1.51* 1.48*  CALCIUM 8.9 9.2 9.2 9.3 9.2  MG  --  2.3  --   --   --    Liver Function Tests: Recent Labs  Lab 11/03/20 1019 11/05/20 0553 11/06/20 0050 11/07/20 0328  AST 55* 30 34 26  ALT 57* 41 36 35  ALKPHOS 77 57 67 58  BILITOT 0.6 0.7 0.7 0.4  PROT 5.9* 5.4* 6.1* 5.7*  ALBUMIN 3.5 3.2* 3.3* 3.1*   No results for input(s): LIPASE, AMYLASE in the last 168 hours. Recent Labs  Lab 11/03/20 1100  AMMONIA 28   CBC: Recent Labs  Lab 11/03/20 1019 11/03/20 1243 11/03/20 1835 11/04/20 0132 11/05/20 0553 11/06/20 0050 11/07/20 0328  WBC 14.2*  --   --  8.2 12.8* 9.0 9.3  NEUTROABS 12.7*  --   --   --   --   --   --   HGB 11.2*   < > 12.2 10.1* 10.7* 11.3* 11.3*  HCT 38.2   < > 36.0 32.8* 34.5* 34.6* 36.4  MCV 96.0  --   --  92.7 90.6 88.0 89.9  PLT 217  --   --  179 209 224 215   < > = values in this interval not displayed.   Cardiac Enzymes: Recent Labs  Lab 11/03/20 1019  CKTOTAL 64   BNP: Invalid input(s): POCBNP CBG: Recent Labs  Lab 11/07/20 0745 11/07/20 1223 11/07/20 1632 11/07/20 1959 11/08/20 0729  GLUCAP 260* 238* 194* 293* 90   D-Dimer No results for input(s): DDIMER in the last 72 hours. Hgb A1c No results for input(s): HGBA1C  in the last 72 hours. Lipid Profile No results for input(s): CHOL, HDL, LDLCALC, TRIG, CHOLHDL, LDLDIRECT in the last 72 hours. Thyroid function studies No results for input(s): TSH, T4TOTAL, T3FREE, THYROIDAB in the last 72 hours.  Invalid input(s): FREET3 Anemia work up No results for input(s):  VITAMINB12, FOLATE, FERRITIN, TIBC, IRON, RETICCTPCT in the last 72 hours. Urinalysis    Component Value Date/Time   COLORURINE YELLOW 11/03/2020 1317   APPEARANCEUR CLOUDY (A) 11/03/2020 1317   APPEARANCEUR Clear 05/23/2020 1627   LABSPEC 1.025 11/03/2020 1317   PHURINE 5.5 11/03/2020 1317   GLUCOSEU NEGATIVE 11/03/2020 1317   GLUCOSEU NEGATIVE 09/21/2020 1622   HGBUR NEGATIVE 11/03/2020 1317   [place] 11/03/2020 1317   BILIRUBINUR Negative 05/23/2020 1627   KETONESUR NEGATIVE 11/03/2020 1317   PROTEINUR NEGATIVE 11/03/2020 1317   UROBILINOGEN 0.2 09/21/2020 1622   NITRITE NEGATIVE 11/03/2020 1317   LEUKOCYTESUR LARGE (A) 11/03/2020 1317   Sepsis Labs Invalid input(s): PROCALCITONIN,  WBC,  LACTICIDVEN Microbiology Recent Results (from the past 240 hour(s))  Resp Panel by RT-PCR (Flu A&B, Covid) Nasopharyngeal Swab     Status: None   Collection Time: 11/03/20 10:26 AM   Specimen: Nasopharyngeal Swab; Nasopharyngeal(NP) swabs in vial transport medium  Result Value Ref Range Status   SARS Coronavirus 2 by RT PCR NEGATIVE NEGATIVE Final    Comment: (NOTE) SARS-CoV-2 target nucleic acids are NOT DETECTED.  The SARS-CoV-2 RNA is generally detectable in upper respiratory specimens during the acute phase of infection. The lowest concentration of SARS-CoV-2 viral copies this assay can detect is 138 copies/mL. A negative result does not preclude SARS-Cov-2 infection and should not be used as the sole basis for treatment or other patient management decisions. A negative result may occur with  improper specimen collection/handling, submission of specimen other than nasopharyngeal swab, presence of viral mutation(s) within the areas targeted by this assay, and inadequate number of viral copies(<138 copies/mL). A negative result must be combined with clinical observations, patient history, and epidemiological information. The expected result is Negative.  Fact Sheet for  Patients:  EntrepreneurPulse.com.au  Fact Sheet for Healthcare Providers:  IncredibleEmployment.be  This test is no t yet approved or cleared by the [place] FDA and  has been authorized for detection and/or diagnosis of SARS-CoV-2 by FDA under an Emergency Use Authorization (EUA). This EUA will remain  in effect (meaning this test can be used) for the duration of the COVID-19 declaration under Section 564(b)(1) of the Act, 21 U.S.C.section 360bbb-3(b)(1), unless the authorization is terminated  or revoked sooner.       Influenza A by PCR NEGATIVE NEGATIVE Final   Influenza B by PCR NEGATIVE NEGATIVE Final    Comment: (NOTE) The Xpert Xpress SARS-CoV-2/FLU/RSV plus assay is intended as an aid in the diagnosis of influenza from Nasopharyngeal swab specimens and should not be used as a sole basis for treatment. Nasal washings and aspirates are unacceptable for Xpert Xpress SARS-CoV-2/FLU/RSV testing.  Fact Sheet for Patients: EntrepreneurPulse.com.au  Fact Sheet for Healthcare Providers: IncredibleEmployment.be  This test is not yet approved or cleared by the [place] FDA and has been authorized for detection and/or diagnosis of SARS-CoV-2 by FDA under an Emergency Use Authorization (EUA). This EUA will remain in effect (meaning this test can be used) for the duration of the COVID-19 declaration under Section 564(b)(1) of the Act, 21 U.S.C. section 360bbb-3(b)(1), unless the authorization is terminated or revoked.  Performed at [facility], [place] [address]  [place], [place] [address]   Urine culture     Status: Abnormal   Collection Time: 11/03/20  1:17 PM   Specimen: Urine, Catheterized  Result Value Ref Range Status   Specimen Description URINE, CATHETERIZED  Final   Special Requests   Final    NONE Performed at [facility], [place] [address] [place],  [address]     Culture >=100,000 COLONIES/mL PROTEUS VULGARIS (A)  Final   Report Status 11/05/2020 FINAL  Final   Organism ID, Bacteria PROTEUS VULGARIS (A)  Final      Susceptibility   Proteus vulgaris - MIC*    AMPICILLIN >=32 RESISTANT Resistant     CEFAZOLIN >=64 RESISTANT Resistant     CEFEPIME <=0.12 SENSITIVE Sensitive     CIPROFLOXACIN <=0.25 SENSITIVE Sensitive     GENTAMICIN <=1 SENSITIVE Sensitive     IMIPENEM 2 SENSITIVE Sensitive     NITROFURANTOIN >=512 RESISTANT Resistant     TRIMETH/SULFA <=20 SENSITIVE Sensitive     AMPICILLIN/SULBACTAM 4 SENSITIVE Sensitive     PIP/TAZO <=4 SENSITIVE Sensitive     * >=100,000 COLONIES/mL PROTEUS VULGARIS     Time coordinating discharge: Over 30 minutes  SIGNED:   Charlsie Fleeger J [place] DO  [facility] 11/08/2020, 10:28 AM

## 2020-11-08 NOTE — Progress Notes (Signed)
Occupational Therapy Treatment Patient Details Name: [name] MRN: [id number] DOB: October 12, 1931 Today's Date: 11/08/2020    History of present illness 87 y.o. female admitted 12/31 with SOB and AMS. PMHx: obesity, back pain; HTN; HLD; DM; COPD on 2L [place] O2; CAD; stage 3 CKD; and chronic diastolic CHF, L THA   OT comments  Pt making steady progress towards OT goals this session. Pt seated in recliner upon OTA arrival with son present. Pt reports having just gotten dressed with her son as pt anticipating DC home later today. Pt continues to present with decreased activity tolerance, generalized weakness and impaired functional endurance. Pt reports at home she ambulates a couple feet with Rw to get into her w/c. Pt able to complete ~ 10 ft functional mobility with Rw and min guard assist. Pt on RA during mobility with sats WFL. Pts son reports having pulse ox at home to monitor SpO2. Education provided on ECS for home with pt and son very receptive to education. Updated to DC recs to [facility] as pt declining SNF. Will let OTR know, will continue to follow acutely per POC.    Follow Up Recommendations  Supervision/Assistance - 24 hour;Home health OT    Equipment Recommendations  Wheelchair (measurements OT);Wheelchair cushion (measurements OT);Hospital bed;3 in 1 bedside commode    Recommendations for Other Services      Precautions / Restrictions Precautions Precautions: Fall Restrictions Weight Bearing Restrictions: No       Mobility Bed Mobility               General bed mobility comments: pt OOB in recliner  Transfers Overall transfer level: Needs assistance   Transfers: Sit to/from Stand Sit to Stand: Min guard         General transfer comment: min guard for safety and cues for hand placement, initial steadying assist    Balance Overall balance assessment: Needs assistance Sitting-balance support: No upper extremity supported;Feet supported Sitting balance-Leahy  Scale: Fair     Standing balance support: Bilateral upper extremity supported Standing balance-Leahy Scale: Poor Standing balance comment: RW for standing and functional mobility                           ADL either performed or assessed with clinical judgement   ADL Overall ADL's : Needs assistance/impaired;At baseline                           Toilet Transfer Details (indicate cue type and reason): pt reports at home her w/c does not fit into her BR, using RW to ambualte in BR and reports having plenty of grab bars for safety         Functional mobility during ADLs: Min guard;Rolling walker General ADL Comments: pt reports having just finished getting dressed with her son, pts son reports she is at her baseline for UB/LB dressing, pt able to ambulate with Rw in room with MIN  guard on RA, sats 98% after household distance functional mobility. Education provided to pt and pts son on ECS related to ADL participation with pt and son receptive to education.     Vision       Perception     Praxis      Cognition Arousal/Alertness: Awake/alert Behavior During Therapy: WFL for tasks assessed/performed Overall Cognitive Status: Within Functional Limits for tasks assessed  Exercises Other Exercises Other Exercises: recommended movign around at home once every hour   Shoulder Instructions       General Comments pt on 3L upon arrival with sats WFL, doffed O2 during ambulation with sats 98% after mobility, issued pt handout for ECS at home, pts son reports having pulse ox at home to monitor sats    Pertinent Vitals/ Pain       Pain Assessment: No/denies pain  Home Living Family/patient expects to be discharged to:: Private residence Living Arrangements: Children                                      Prior Functioning/Environment              Frequency  Min 2X/week         Progress Toward Goals  OT Goals(current goals can now be found in the care plan section)  Progress towards OT goals: Progressing toward goals  Acute Rehab OT Goals Patient Stated Goal: none stated OT Goal Formulation: Patient unable to participate in goal setting Time For Goal Achievement: 11/18/20 Potential to Achieve Goals: [place] Discharge plan needs to be updated;Frequency needs to be updated    Co-evaluation                 AM-PAC OT "6 Clicks" Daily Activity     Outcome Measure   Help from another person eating meals?: None Help from another person taking care of personal grooming?: A Little Help from another person toileting, which includes using toliet, bedpan, or urinal?: A Little Help from another person bathing (including washing, rinsing, drying)?: A Lot Help from another person to put on and taking off regular upper body clothing?: A Little Help from another person to put on and taking off regular lower body clothing?: A Lot 6 Click Score: 17    End of Session Equipment Utilized During Treatment: Oxygen;Rolling walker;Other (comment) (3L)  OT Visit Diagnosis: Unsteadiness on feet (R26.81);Muscle weakness (generalized) (M62.81)   Activity Tolerance Patient tolerated treatment well   Patient Left in chair;with call bell/phone within reach;with family/visitor present   Nurse Communication Mobility status        Time: [id number] OT Time Calculation (min): 16 min  Charges: OT General Charges $OT Visit: 1 Visit OT Treatments $Self Care/Home Management : 8-22 mins  Harley Alto., COTA/L Acute Rehabilitation Services [phone]    Precious Haws 11/08/2020, 4:00 PM

## 2020-11-09 ENCOUNTER — Other Ambulatory Visit: Payer: Self-pay | Admitting: Internal Medicine

## 2020-11-13 ENCOUNTER — Other Ambulatory Visit: Payer: Self-pay | Admitting: *Deleted

## 2020-11-13 NOTE — Patient Outreach (Signed)
[place] [facility]) Care Management  11/13/2020  [name] 07-05-1931 [id number]   EMMI-GENERAL DISCHARGE-UNSUCCESSFUL RED ON EMMI ALERT Day #1 Date: 11/10/2020 Red Alert Reason: UNFILLED PRESCRIPTION AND UNABLE TO FILL MEDICATIONS.  OUTREACH #1 RN attempted outreach today however unsuccessful. RN able to leave a HIPAA approved voice message requesting a call back.  Will send outreach letter and reschedule another outreach call over the next week.  Raina Mina, RN Care Management Coordinator [place] Office [phone]

## 2020-11-14 ENCOUNTER — Other Ambulatory Visit: Payer: Self-pay | Admitting: *Deleted

## 2020-11-14 NOTE — Patient Outreach (Signed)
[facility]) Care Management  11/14/2020  SHIRAH ROSEMAN 11-07-1930 [id number]    EMMI: GENERAL DISCHARGE-UNSUCCESSFUL DAY: #1 DATE: 11/20/2020 ISSUES: UNFILLED PRESCRIPTION AND UNABLE TO FILL  OUTREACH #2 RN attempted another outreach however unsuccessful. RN able to leave a HIPAA approved voice message requesting a call back.  Will rescheduled another outreach over the next week.  Raina Mina, RN Care Management Coordinator [place] Office [phone]

## 2020-11-15 ENCOUNTER — Inpatient Hospital Stay: Payer: Medicare Other | Admitting: Internal Medicine

## 2020-11-17 ENCOUNTER — Other Ambulatory Visit: Payer: Self-pay | Admitting: Internal Medicine

## 2020-11-17 ENCOUNTER — Other Ambulatory Visit: Payer: Self-pay | Admitting: *Deleted

## 2020-11-17 ENCOUNTER — Other Ambulatory Visit: Payer: Self-pay

## 2020-11-17 ENCOUNTER — Encounter: Payer: Self-pay | Admitting: Internal Medicine

## 2020-11-17 ENCOUNTER — Ambulatory Visit (INDEPENDENT_AMBULATORY_CARE_PROVIDER_SITE_OTHER): Payer: Medicare Other | Admitting: Internal Medicine

## 2020-11-17 VITALS — BP 124/68 | HR 65 | Temp 99.1°F | Ht 64.0 in | Wt 189.2 lb

## 2020-11-17 DIAGNOSIS — N390 Urinary tract infection, site not specified: Secondary | ICD-10-CM | POA: Diagnosis not present

## 2020-11-17 DIAGNOSIS — I5033 Acute on chronic diastolic (congestive) heart failure: Secondary | ICD-10-CM | POA: Diagnosis not present

## 2020-11-17 DIAGNOSIS — I5032 Chronic diastolic (congestive) heart failure: Secondary | ICD-10-CM | POA: Diagnosis not present

## 2020-11-17 DIAGNOSIS — J441 Chronic obstructive pulmonary disease with (acute) exacerbation: Secondary | ICD-10-CM

## 2020-11-17 DIAGNOSIS — M1A30X0 Chronic gout due to renal impairment, unspecified site, without tophus (tophi): Secondary | ICD-10-CM | POA: Diagnosis not present

## 2020-11-17 LAB — URINALYSIS, ROUTINE W REFLEX MICROSCOPIC
Bilirubin Urine: NEGATIVE
Hgb urine dipstick: NEGATIVE
Ketones, ur: NEGATIVE
Nitrite: NEGATIVE
Specific Gravity, Urine: 1.015 (ref 1.000–1.030)
Total Protein, Urine: NEGATIVE
Urine Glucose: NEGATIVE
Urobilinogen, UA: 0.2 (ref 0.0–1.0)
pH: 6 (ref 5.0–8.0)

## 2020-11-17 LAB — URIC ACID: Uric Acid, Serum: 6.5 mg/dL (ref 2.4–7.0)

## 2020-11-17 LAB — COMPREHENSIVE METABOLIC PANEL
ALT: 28 U/L (ref 0–35)
AST: 19 U/L (ref 0–37)
Albumin: 3.6 g/dL (ref 3.5–5.2)
Alkaline Phosphatase: 66 U/L (ref 39–117)
BUN: 34 mg/dL — ABNORMAL HIGH (ref 6–23)
CO2: 29 mEq/L (ref 19–32)
Calcium: 8.9 mg/dL (ref 8.4–10.5)
Chloride: 104 mEq/L (ref 96–112)
Creatinine, Ser: 1.27 mg/dL — ABNORMAL HIGH (ref 0.40–1.20)
GFR: 38.07 mL/min — ABNORMAL LOW (ref >60.00)
Glucose, Bld: 156 mg/dL — ABNORMAL HIGH (ref 70–99)
Potassium: 4 mEq/L (ref 3.5–5.1)
Sodium: 141 mEq/L (ref 135–145)
Total Bilirubin: 0.5 mg/dL (ref 0.2–1.2)
Total Protein: 5.6 g/dL — ABNORMAL LOW (ref 6.0–8.3)

## 2020-11-17 LAB — CBC
HCT: 38.4 % (ref 36.0–46.0)
Hemoglobin: 11.9 g/dL — ABNORMAL LOW (ref 12.0–15.0)
MCHC: 31 g/dL (ref 30.0–36.0)
MCV: 88.5 fl (ref 78.0–100.0)
Platelets: 222 10*3/uL (ref 150.0–400.0)
RBC: 4.34 Mil/uL (ref 3.87–5.11)
RDW: 16.6 % — ABNORMAL HIGH (ref 11.5–15.5)
WBC: 12.8 10*3/uL — ABNORMAL HIGH (ref 4.0–10.5)

## 2020-11-17 MED ORDER — MOMETASONE FURO-FORMOTEROL FUM 100-5 MCG/ACT IN AERO: 2.0000 | INHALATION_SPRAY | Freq: Two times a day (BID) | RESPIRATORY_TRACT | 11 refills | Status: DC

## 2020-11-17 MED ORDER — IPRATROPIUM BROMIDE HFA 17 MCG/ACT IN AERS: 2.0000 | INHALATION_SPRAY | RESPIRATORY_TRACT | 12 refills | Status: DC | PRN

## 2020-11-17 NOTE — Assessment & Plan Note (Signed)
We talked about inhalers and various options and how most of these are brand name rather than generic. She is not sure that she is noticing a difference with them. She has done well with home health for PT at home.

## 2020-11-17 NOTE — Patient Instructions (Signed)
We have sent in the generic of dulera and the ipratroprium.

## 2020-11-17 NOTE — Assessment & Plan Note (Signed)
Checking U/A and urine culture.

## 2020-11-17 NOTE — Assessment & Plan Note (Signed)
Checking uric acid and adjust allopurinol as needed.  

## 2020-11-17 NOTE — Assessment & Plan Note (Signed)
Stable, no flare.

## 2020-11-17 NOTE — Patient Outreach (Signed)
[facility]) Care Management  11/17/2020  ELVERTA DIMICELI 1931/06/01 [id number]   EMMI-GENERAL DISCHARGE-UNSUCCESSFUL RED ON EMMI ALERT Day #1 Date:11/10/2020 Red Alert Reason:UNFILLED PRESCRIPTIONS  OUTREACH #3 RN attempted another outreach however remains unsuccessful. RN able to leave a HIPAA approved voice message requesting a call back.  Will scheduled the last outreach in a few weeks per protocol.  Raina Mina, RN Care Management Coordinator [place] Office [phone]

## 2020-11-17 NOTE — Progress Notes (Signed)
   Subjective:   Patient ID: [name], female    DOB: Apr 25, 1931, 87 y.o.   MRN: [id number]  HPI The patient is an 87 YO female coming in for hospital follow up (in for COPD flare, discharged with dulera and spiriva inhalers, she is likely unable to afford long term as they are several hundred dollars each). She feels she is doing well, working with PT at home. Denies falls or instability. Denies SOB. She does have intermittent cough. No worsening. No chest pains or weight gain. She does not feel she is having heart failure flare. Has gone back to allopurinol 100 mg daily.   Review of Systems  Constitutional: Negative.   HENT: Negative.   Eyes: Negative.   Respiratory: Positive for cough. Negative for chest tightness and shortness of breath.   Cardiovascular: Negative for chest pain, palpitations and leg swelling.  Gastrointestinal: Negative for abdominal distention, abdominal pain, constipation, diarrhea, nausea and vomiting.  Musculoskeletal: Negative.   Skin: Negative.   Neurological: Negative.   Psychiatric/Behavioral: Negative.     Objective:  Physical Exam Constitutional:      Appearance: She is well-developed and well-nourished. She is obese.  HENT:     Head: Normocephalic and atraumatic.  Eyes:     Extraocular Movements: EOM normal.  Cardiovascular:     Rate and Rhythm: Normal rate and regular rhythm.  Pulmonary:     Effort: Pulmonary effort is normal. No respiratory distress.     Breath sounds: Normal breath sounds. No wheezing or rales.  Abdominal:     General: Bowel sounds are normal. There is no distension.     Palpations: Abdomen is soft.     Tenderness: There is no abdominal tenderness. There is no rebound.  Musculoskeletal:        General: No edema.     Cervical back: Normal range of motion.  Skin:    General: Skin is warm and dry.  Neurological:     Mental Status: She is alert and oriented to person, place, and time.     Coordination: Coordination  abnormal.     Comments: Slow gait, wheelchair for long distances  Psychiatric:        Mood and Affect: Mood and affect normal.     Vitals:   11/17/20 1431  BP: 124/68  Pulse: 65  Temp: 99.1 F (37.3 C)  TempSrc: Oral  SpO2: 98%  Weight: 189 lb 3.2 oz (85.8 kg)  Height: 5\' 4"  (1.626 m)    This visit occurred during the SARS-CoV-2 public health emergency.  Safety protocols were in place, including screening questions prior to the visit, additional usage of staff PPE, and extensive cleaning of exam room while observing appropriate contact time as indicated for disinfecting solutions.   Assessment & Plan:

## 2020-11-20 ENCOUNTER — Other Ambulatory Visit: Payer: Self-pay | Admitting: Internal Medicine

## 2020-11-20 LAB — URINE CULTURE

## 2020-11-20 MED ORDER — BREO ELLIPTA 100-25 MCG/INH IN AEPB: 1.0000 | INHALATION_SPRAY | Freq: Every day | RESPIRATORY_TRACT | 6 refills | Status: DC

## 2020-11-20 MED ORDER — CEPHALEXIN 500 MG PO CAPS: 500.0000 mg | ORAL_CAPSULE | Freq: Two times a day (BID) | ORAL | 0 refills | Status: DC

## 2020-11-27 ENCOUNTER — Other Ambulatory Visit: Payer: Self-pay | Admitting: Cardiovascular Disease

## 2020-11-27 ENCOUNTER — Telehealth: Payer: Self-pay | Admitting: Internal Medicine

## 2020-11-27 MED ORDER — CIPROFLOXACIN HCL 500 MG PO TABS: 500.0000 mg | ORAL_TABLET | Freq: Two times a day (BID) | ORAL | 0 refills | Status: DC

## 2020-11-27 NOTE — Telephone Encounter (Signed)
Pt's son following up regarding medication prescribed for UTI. Mom not herself, and needs to hear back asap.

## 2020-11-27 NOTE — Telephone Encounter (Signed)
Patient's son is aware of the new prescription that has been sent in. No other questions or concerns at this time.

## 2020-11-27 NOTE — Telephone Encounter (Signed)
Patients son called and said that cephALEXin (KEFLEX) 500 MG capsule was not helping his mother and was wondering if there was something else that could be prescribed for her. Also, he had questions about the patients recent lab results. He can be reached at [phone]

## 2020-11-27 NOTE — Telephone Encounter (Signed)
Spoke with the patient's son [name] and he stated that her symptoms seem to be worse now than they were in the beginning. He believes this UTI is multi drug resistant and he believes she may have picked this up from the hospital. He stated that he noticed she is slipping into a confused state. Patient did express to her son that she is now having painful urination that started yesterday. Patient's son was appreciative for our office calling him back in a timely manner.

## 2020-11-27 NOTE — Telephone Encounter (Signed)
Sent in ciprofloxacin 1 pill twice a day for 1 week.

## 2020-11-27 NOTE — Addendum Note (Signed)
Addended by: Pricilla Holm A on: 11/27/2020 02:53 PM   Modules accepted: Orders

## 2020-12-03 ENCOUNTER — Other Ambulatory Visit: Payer: Self-pay | Admitting: Cardiovascular Disease

## 2020-12-03 ENCOUNTER — Other Ambulatory Visit: Payer: Self-pay | Admitting: Family Medicine

## 2020-12-04 NOTE — Telephone Encounter (Signed)
Dr. Georgina Snell did not prescribe this medication. Pt should contact the physician who prescribed medication. Dr. Eric [place]

## 2020-12-13 DIAGNOSIS — E782 Mixed hyperlipidemia: Secondary | ICD-10-CM | POA: Diagnosis not present

## 2020-12-13 DIAGNOSIS — E1165 Type 2 diabetes mellitus with hyperglycemia: Secondary | ICD-10-CM | POA: Diagnosis not present

## 2020-12-13 DIAGNOSIS — D631 Anemia in chronic kidney disease: Secondary | ICD-10-CM | POA: Diagnosis not present

## 2020-12-13 DIAGNOSIS — Z794 Long term (current) use of insulin: Secondary | ICD-10-CM | POA: Diagnosis not present

## 2020-12-13 DIAGNOSIS — R300 Dysuria: Secondary | ICD-10-CM | POA: Diagnosis not present

## 2020-12-13 DIAGNOSIS — N183 Chronic kidney disease, stage 3 unspecified: Secondary | ICD-10-CM | POA: Diagnosis not present

## 2020-12-13 DIAGNOSIS — N182 Chronic kidney disease, stage 2 (mild): Secondary | ICD-10-CM | POA: Diagnosis not present

## 2020-12-13 DIAGNOSIS — I10 Essential (primary) hypertension: Secondary | ICD-10-CM | POA: Diagnosis not present

## 2020-12-13 DIAGNOSIS — N1832 Chronic kidney disease, stage 3b: Secondary | ICD-10-CM | POA: Diagnosis not present

## 2020-12-15 ENCOUNTER — Other Ambulatory Visit: Payer: Self-pay | Admitting: *Deleted

## 2020-12-15 DIAGNOSIS — E1122 Type 2 diabetes mellitus with diabetic chronic kidney disease: Secondary | ICD-10-CM | POA: Diagnosis not present

## 2020-12-15 DIAGNOSIS — Z794 Long term (current) use of insulin: Secondary | ICD-10-CM | POA: Diagnosis not present

## 2020-12-15 DIAGNOSIS — E113393 Type 2 diabetes mellitus with moderate nonproliferative diabetic retinopathy without macular edema, bilateral: Secondary | ICD-10-CM | POA: Diagnosis not present

## 2020-12-15 DIAGNOSIS — E782 Mixed hyperlipidemia: Secondary | ICD-10-CM | POA: Diagnosis not present

## 2020-12-15 DIAGNOSIS — N1832 Chronic kidney disease, stage 3b: Secondary | ICD-10-CM | POA: Diagnosis not present

## 2020-12-15 DIAGNOSIS — I5032 Chronic diastolic (congestive) heart failure: Secondary | ICD-10-CM | POA: Diagnosis not present

## 2020-12-15 DIAGNOSIS — E1165 Type 2 diabetes mellitus with hyperglycemia: Secondary | ICD-10-CM | POA: Diagnosis not present

## 2020-12-15 DIAGNOSIS — E1142 Type 2 diabetes mellitus with diabetic polyneuropathy: Secondary | ICD-10-CM | POA: Diagnosis not present

## 2020-12-15 DIAGNOSIS — E559 Vitamin D deficiency, unspecified: Secondary | ICD-10-CM | POA: Diagnosis not present

## 2020-12-15 DIAGNOSIS — D631 Anemia in chronic kidney disease: Secondary | ICD-10-CM | POA: Diagnosis not present

## 2020-12-15 DIAGNOSIS — I130 Hypertensive heart and chronic kidney disease with heart failure and stage 1 through stage 4 chronic kidney disease, or unspecified chronic kidney disease: Secondary | ICD-10-CM | POA: Diagnosis not present

## 2020-12-15 NOTE — Patient Outreach (Signed)
[facility]) Care Management  12/15/2020  MALEIGHA COLVARD 1931-07-26 [id number]   EMMI-GENERAL DISCHARGE-UNSUCCESSFUL RED ON EMMI ALERT Day #1 Date: 11/20/2020 Red Alert Reason:UNFILLED PRESCRIPTIONS  OUTREACH#4 RN attempted another outreach call however remains unsuccessful. RN able to leave a HIPAA approved voice message requesting a call back. Will send letter to the provider and close this case from further outreach calls on the task.   Case will be closed.  Raina Mina, RN Care Management Coordinator [place] Office [phone]

## 2020-12-18 ENCOUNTER — Other Ambulatory Visit: Payer: Self-pay | Admitting: Family Medicine

## 2020-12-22 ENCOUNTER — Encounter: Payer: Self-pay | Admitting: Internal Medicine

## 2020-12-22 ENCOUNTER — Ambulatory Visit: Payer: Medicare Other | Admitting: Internal Medicine

## 2020-12-22 ENCOUNTER — Other Ambulatory Visit: Payer: Self-pay | Admitting: Family

## 2020-12-22 MED ORDER — ALLOPURINOL 100 MG PO TABS: 100.0000 mg | ORAL_TABLET | Freq: Every day | ORAL | 0 refills | Status: DC

## 2021-01-09 ENCOUNTER — Other Ambulatory Visit: Payer: Self-pay | Admitting: Cardiovascular Disease

## 2021-01-16 ENCOUNTER — Ambulatory Visit: Payer: Medicare Other | Admitting: Internal Medicine

## 2021-02-15 ENCOUNTER — Other Ambulatory Visit: Payer: Self-pay | Admitting: Internal Medicine

## 2021-02-15 ENCOUNTER — Ambulatory Visit (INDEPENDENT_AMBULATORY_CARE_PROVIDER_SITE_OTHER): Payer: Medicare Other | Admitting: Internal Medicine

## 2021-02-15 ENCOUNTER — Encounter: Payer: Self-pay | Admitting: Internal Medicine

## 2021-02-15 ENCOUNTER — Other Ambulatory Visit: Payer: Self-pay

## 2021-02-15 VITALS — BP 130/66 | HR 66 | Temp 98.1°F | Resp 18 | Ht 64.0 in

## 2021-02-15 DIAGNOSIS — R300 Dysuria: Secondary | ICD-10-CM

## 2021-02-15 DIAGNOSIS — N1832 Chronic kidney disease, stage 3b: Secondary | ICD-10-CM | POA: Diagnosis not present

## 2021-02-15 DIAGNOSIS — I10 Essential (primary) hypertension: Secondary | ICD-10-CM | POA: Diagnosis not present

## 2021-02-15 MED ORDER — LEVOFLOXACIN 500 MG PO TABS: 500.0000 mg | ORAL_TABLET | Freq: Every day | ORAL | 0 refills | Status: AC

## 2021-02-15 MED ORDER — CEFTRIAXONE SODIUM 1 G IJ SOLR
1.0000 g | Freq: Once | INTRAMUSCULAR | Status: AC
Start: 2021-02-15 — End: 2021-02-15
  Administered 2021-02-15: 1 g via INTRAMUSCULAR

## 2021-02-15 NOTE — Progress Notes (Signed)
Patient ID: [name], female   DOB: 09-23-1931, 87 y.o.   MRN: [id number]        Chief Complaint: 2-3 days dysuria       HPI:  [name] is a 87 y.o. female here with son with c/o urinary symptoms and hx of recurrent UTI now about every 2-4 months in the past couple of years, this time hoping to catch it early with treatment, especially with holidays coming up.  Has mild dysuria and urgency, but Denies urinary symptoms such as frequency, flank pain, hematuria or n/v, fever, chills.  Son mentions mild behavioral change though pt denies this.  Pt denies chest pain, increased sob or doe, wheezing, orthopnea, PND, increased LE swelling, palpitations, dizziness or syncope.  Pt denies new focal neuro s/s.   Pt denies polydipsia, polyuria.          Wt Readings from Last 3 Encounters:  11/17/20 189 lb 3.2 oz (85.8 kg)  11/05/20 192 lb 14.4 oz (87.5 kg)  09/21/20 198 lb (89.8 kg)   BP Readings from Last 3 Encounters:  02/15/21 130/66  11/17/20 124/68  11/08/20 (!) 152/53         Past Medical History:  Diagnosis Date  . Anasarca 04/2009   found to be secondary to pericardial effusion with tamponade/pericardial window done  . Chronic diastolic CHF (congestive heart failure) ([place])   . CKD (chronic kidney disease), stage III ([place])   . COPD (chronic obstructive pulmonary disease) ([place])   . Coronary artery disease    a. mild-mod CAD 2004.  . Diabetes mellitus    insulin dependent  . Herpes labialis    Healing herpes labialis  . Hypercholesterolemia   . Hypertension   . Lumbar radiculopathy   . Lumbar scoliosis   . Morbid obesity ([place])   . Pericardial effusion 04/2009   a. 2010 with tamponade s/p window.  Marland Kitchen PONV (postoperative nausea and vomiting)    has not had any problems since 2001  . Spondylolisthesis   . Spondylosis    Past Surgical History:  Procedure Laterality Date  . BACK SURGERY     microdiskectomy L3-4 on L/partial facetectomy 3-4 on L/removal of synovial cyst on left  L3-4  . BREAST BIOPSY     left  . CARDIAC CATHETERIZATION  04/25/2003   L heart cath w/coronary angiography/R femoral artery/ EF 65%/no mitral regurgitation/ angiography L main coronary artery smooth & normal  . EYE SURGERY     ophthalmoscopy/peritomy adjacent to the limbus from the 8 to 2:30 position superiorly  . lumbar laminectomy    . SUBXYPHOID PERICARDIAL WINDOW  04/2009   for pericardial effusion w/pericardial tamponade/sac drained w/20-French Baard drain/transesophageal echocardiogram confimed complete evacution of pericardial fluid  . TOTAL HIP ARTHROPLASTY  03/06/2012   Procedure: TOTAL HIP ARTHROPLASTY;  Surgeon: Kerin Salen, MD;  Location: [facility];  Service: Orthopedics;  Laterality: Left;  DEPUY/PINNACLE, SUMMIT STEM, CUP POLY & CERAMIC  . VAGINAL HYSTERECTOMY      reports that she has never smoked. She has never used smokeless tobacco. She reports that she does not drink alcohol and does not use drugs. family history includes Angina in her mother; Coronary artery disease in an other family member; Heart attack in her father; Hypertension in her father; Stroke in her mother. Allergies  Allergen Reactions  . Atenolol Other (See Comments)    weakness  . Chlorhexidine   . Codeine Nausea And Vomiting    Hydrocodone is ok  .  Lactose Intolerance (Gi) Diarrhea  . Lodine [Etodolac] Other (See Comments)    dizziness  . Sulfa Drugs Cross Reactors Nausea Only  . Sulfamethoxazole Nausea And Vomiting  . Benzonatate Nausea And Vomiting  . Escitalopram Oxalate Other (See Comments)    mild hallucinations  . Metformin Nausea And Vomiting    Reaction to IR and XL  . Oxycodone-Acetaminophen Other (See Comments)    Percocet does not relieve pts' pain  . Pregabalin Other (See Comments)    Unknown reaction   Current Outpatient Medications on File Prior to Visit  Medication Sig Dispense Refill  . acetaminophen (TYLENOL) 500 MG tablet Take 500 mg by mouth daily as needed for mild pain or  headache.    . allopurinol (ZYLOPRIM) 100 MG tablet Take 1 tablet (100 mg total) by mouth daily. 90 tablet 0  . ALPRAZolam (XANAX) 0.25 MG tablet Take 1 tablet (0.25 mg total) by mouth at bedtime as needed for anxiety. (Patient taking differently: Take 0.25 mg by mouth at bedtime.) 30 tablet 5  . amLODipine (NORVASC) 5 MG tablet TAKE 1 TABLET BY MOUTH EVERY DAY IN THE MORNING 90 tablet 0  . aspirin EC 81 MG tablet Take 81 mg by mouth every morning.     Marland Kitchen atorvastatin (LIPITOR) 40 MG tablet Take 40 mg by mouth every morning.     . diclofenac Sodium (VOLTAREN) 1 % GEL Apply 4 g topically 4 (four) times daily. To affected joint. (Patient taking differently: Apply 4 g topically daily as needed (joint pain).) 100 g 11  . ergocalciferol (VITAMIN D2) 50000 UNITS capsule Take 50,000 Units by mouth every Friday.     . fluticasone (FLONASE) 50 MCG/ACT nasal spray Place 2 sprays into both nostrils daily. 16 g 1  . fluticasone furoate-vilanterol (BREO ELLIPTA) 100-25 MCG/INH AEPB Inhale 1 puff into the lungs daily. 30 each 6  . furosemide (LASIX) 80 MG tablet TAKE 1 TABLET BY MOUTH EVERY DAY 30 tablet 3  . gabapentin (NEURONTIN) 100 MG capsule TAKE 1 CAPSULE BY MOUTH THREE TIMES A DAY 90 capsule 2  . hydrALAZINE (APRESOLINE) 25 MG tablet TAKE 1 TABLET BY MOUTH TWICE A DAY 60 tablet 3  . insulin aspart (NOVOLOG) 100 UNIT/ML injection Inject 3-8 Units into the skin 3 (three) times daily with meals. Based on a sliding scale.    . insulin glargine (LANTUS) 100 UNIT/ML injection Inject 0.25 mLs (25 Units total) into the skin at bedtime. 10 mL 1  . ipratropium (ATROVENT HFA) 17 MCG/ACT inhaler Inhale 2 puffs into the lungs every 4 (four) hours as needed for wheezing. 1 each 12  . LACTASE PO Take 1 tablet by mouth 3 (three) times daily as needed (if meal includes cheese or other dairy).    . metoprolol tartrate (LOPRESSOR) 25 MG tablet Take 25 mg by mouth 2 (two) times daily.    . Multiple Vitamin (MULTIVITAMIN WITH  MINERALS) TABS tablet Take 1 tablet by mouth daily.    . ONE TOUCH ULTRA TEST test strip 1 each by Other route as directed.    Marland Kitchen oxyCODONE-acetaminophen (PERCOCET/ROXICET) 5-325 MG tablet Take 1 tablet by mouth every 4 (four) hours as needed for severe pain. 30 tablet 0  . OXYGEN Inhale 1.5 Lipoprotein Lipase Releasing Units into the lungs. 1.5L to 2L    . Polyethyl Glycol-Propyl Glycol 0.4-0.3 % SOLN Apply 2 drops to eye daily as needed (dry eyes).     . Probiotic Product (PROBIOTIC DAILY PO) Take 1 tablet by  mouth daily.    Marland Kitchen RELION INSULIN SYR 0.3ML/31G 31G X 5/16" 0.3 ML MISC 1 each by Other route as needed (insulin).     No current facility-administered medications on file prior to visit.        ROS:  All others reviewed and negative.  Objective        PE:  BP 130/66   Pulse 66   Temp 98.1 F (36.7 C) (Oral)   Resp 18   Ht 5\' 4"  (1.626 m)   SpO2 98%   BMI 32.48 kg/m                 Constitutional: Pt appears in NAD               HENT: Head: NCAT.                Right Ear: External ear normal.                 Left Ear: External ear normal.                Eyes: . Pupils are equal, round, and reactive to light. Conjunctivae and EOM are normal               Nose: without d/c or deformity               Neck: Neck supple. Gross normal ROM               Cardiovascular: Normal rate and regular rhythm.                 Pulmonary/Chest: Effort normal and breath sounds without rales or wheezing.                Abd:  Soft, NT, ND, + BS, no organomegaly               Neurological: Pt is alert. At baseline orientation, motor grossly intact               Skin: Skin is warm. No rashes, no other new lesions, LE edema - trace bilat               Psychiatric: Pt behavior is normal without agitation   Micro: none  Cardiac tracings I have personally interpreted today:  none  Pertinent Radiological findings (summarize): none   Lab Results  Component Value Date   WBC 12.8 (H) 11/17/2020    HGB 11.9 (L) 11/17/2020   HCT 38.4 11/17/2020   PLT 222.0 11/17/2020   GLUCOSE 156 (H) 11/17/2020   CHOL 146 06/28/2019   TRIG 230 (A) 06/28/2019   HDL 28 (A) 06/28/2019   LDLCALC 72 06/28/2019   ALT 28 11/17/2020   AST 19 11/17/2020   NA 141 11/17/2020   K 4.0 11/17/2020   CL 104 11/17/2020   CREATININE 1.27 (H) 11/17/2020   BUN 34 (H) 11/17/2020   CO2 29 11/17/2020   TSH 0.259 (L) 11/04/2020   INR 1.01 03/04/2012   HGBA1C 7.9 (H) 11/04/2020   Assessment/Plan:  [name] is a 87 y.o. White or Caucasian [1] female with  has a past medical history of Anasarca (04/2009), Chronic diastolic CHF (congestive heart failure) ([place]), CKD (chronic kidney disease), stage III ([place]), COPD (chronic obstructive pulmonary disease) ([place]), Coronary artery disease, Diabetes mellitus, Herpes labialis, Hypercholesterolemia, Hypertension, Lumbar radiculopathy, Lumbar scoliosis, Morbid obesity ([place]), Pericardial effusion (04/2009), PONV (postoperative nausea and vomiting), Spondylolisthesis, and Spondylosis.  Dysuria Mild and appears "early" possible uti - for urine studies, rocephin 1 gm IM and levaquin asd,  to f/u any worsening symptoms or concerns  Hypertension, essential BP Readings from Last 3 Encounters:  02/15/21 130/66  11/17/20 124/68  11/08/20 (!) 152/53   Stable, pt to continue medical treatment norvasc, hydralzaein, lopressor   CKD (chronic kidney disease) stage 3, GFR 30-59 ml/min (H[facility]) Lab Results  Component Value Date   CREATININE 1.27 (H) 11/17/2020   Stable overall, cont to avoid nephrotoxins, declines f/u lab today  Followup: Return if symptoms worsen or fail to improve.  Cathlean Cower, MD 02/16/2021 12:47 PM [facility]

## 2021-02-15 NOTE — Patient Instructions (Signed)
You had the antibiotic shot today (rocephin)  Please take all new medication as prescribed - the levaquin antibiotic for home  Please continue all other medications as before, and refills have been done if requested.  Please have the pharmacy call with any other refills you may need.  Please keep your appointments with your specialists as you may have planned  Please go to the LAB at the blood drawing area for the tests to be done - just the urine testing today  You will be contacted by phone if any changes need to be made immediately.  Otherwise, you will receive a letter about your results with an explanation, but please check with MyChart first.  Please remember to sign up for MyChart if you have not done so, as this will be important to you in the future with finding out test results, communicating by private email, and scheduling acute appointments online when needed.

## 2021-02-16 ENCOUNTER — Encounter: Payer: Self-pay | Admitting: Internal Medicine

## 2021-02-16 NOTE — Assessment & Plan Note (Signed)
BP Readings from Last 3 Encounters:  02/15/21 130/66  11/17/20 124/68  11/08/20 (!) 152/53   Stable, pt to continue medical treatment norvasc, hydralzaein, lopressor

## 2021-02-16 NOTE — Assessment & Plan Note (Signed)
Mild and appears "early" possible uti - for urine studies, rocephin 1 gm IM and levaquin asd,  to f/u any worsening symptoms or concerns

## 2021-02-16 NOTE — Assessment & Plan Note (Signed)
Lab Results  Component Value Date   CREATININE 1.27 (H) 11/17/2020   Stable overall, cont to avoid nephrotoxins, declines f/u lab today

## 2021-02-17 ENCOUNTER — Encounter: Payer: Self-pay | Admitting: Internal Medicine

## 2021-02-17 LAB — URINALYSIS, ROUTINE W REFLEX MICROSCOPIC
Bilirubin Urine: NEGATIVE
Glucose, UA: NEGATIVE
Hgb urine dipstick: NEGATIVE
Ketones, ur: NEGATIVE
Nitrite: NEGATIVE
RBC / HPF: NONE SEEN /HPF (ref 0–2)
Specific Gravity, Urine: 1.009 (ref 1.001–1.03)
Squamous Epithelial / HPF: NONE SEEN /HPF (ref <=5)
WBC, UA: >=60 /HPF — AB (ref 0–5)
pH: 6 (ref 5.0–8.0)

## 2021-02-17 LAB — MICROSCOPIC MESSAGE

## 2021-02-17 LAB — URINE CULTURE

## 2021-03-13 ENCOUNTER — Other Ambulatory Visit: Payer: Self-pay | Admitting: Cardiovascular Disease

## 2021-03-14 DIAGNOSIS — I10 Essential (primary) hypertension: Secondary | ICD-10-CM | POA: Diagnosis not present

## 2021-03-14 DIAGNOSIS — R399 Unspecified symptoms and signs involving the genitourinary system: Secondary | ICD-10-CM | POA: Diagnosis not present

## 2021-03-14 DIAGNOSIS — Z794 Long term (current) use of insulin: Secondary | ICD-10-CM | POA: Diagnosis not present

## 2021-03-14 DIAGNOSIS — N1832 Chronic kidney disease, stage 3b: Secondary | ICD-10-CM | POA: Diagnosis not present

## 2021-03-14 DIAGNOSIS — D631 Anemia in chronic kidney disease: Secondary | ICD-10-CM | POA: Diagnosis not present

## 2021-03-14 DIAGNOSIS — N182 Chronic kidney disease, stage 2 (mild): Secondary | ICD-10-CM | POA: Diagnosis not present

## 2021-03-14 DIAGNOSIS — E1165 Type 2 diabetes mellitus with hyperglycemia: Secondary | ICD-10-CM | POA: Diagnosis not present

## 2021-03-14 DIAGNOSIS — E782 Mixed hyperlipidemia: Secondary | ICD-10-CM | POA: Diagnosis not present

## 2021-03-16 DIAGNOSIS — E1122 Type 2 diabetes mellitus with diabetic chronic kidney disease: Secondary | ICD-10-CM | POA: Diagnosis not present

## 2021-03-16 DIAGNOSIS — D631 Anemia in chronic kidney disease: Secondary | ICD-10-CM | POA: Diagnosis not present

## 2021-03-16 DIAGNOSIS — E782 Mixed hyperlipidemia: Secondary | ICD-10-CM | POA: Diagnosis not present

## 2021-03-16 DIAGNOSIS — I130 Hypertensive heart and chronic kidney disease with heart failure and stage 1 through stage 4 chronic kidney disease, or unspecified chronic kidney disease: Secondary | ICD-10-CM | POA: Diagnosis not present

## 2021-03-16 DIAGNOSIS — E113393 Type 2 diabetes mellitus with moderate nonproliferative diabetic retinopathy without macular edema, bilateral: Secondary | ICD-10-CM | POA: Diagnosis not present

## 2021-03-16 DIAGNOSIS — E559 Vitamin D deficiency, unspecified: Secondary | ICD-10-CM | POA: Diagnosis not present

## 2021-03-16 DIAGNOSIS — E1142 Type 2 diabetes mellitus with diabetic polyneuropathy: Secondary | ICD-10-CM | POA: Diagnosis not present

## 2021-03-16 DIAGNOSIS — E1159 Type 2 diabetes mellitus with other circulatory complications: Secondary | ICD-10-CM | POA: Diagnosis not present

## 2021-03-16 DIAGNOSIS — N1832 Chronic kidney disease, stage 3b: Secondary | ICD-10-CM | POA: Diagnosis not present

## 2021-03-16 DIAGNOSIS — E1165 Type 2 diabetes mellitus with hyperglycemia: Secondary | ICD-10-CM | POA: Diagnosis not present

## 2021-03-16 DIAGNOSIS — Z23 Encounter for immunization: Secondary | ICD-10-CM | POA: Diagnosis not present

## 2021-03-16 DIAGNOSIS — I5032 Chronic diastolic (congestive) heart failure: Secondary | ICD-10-CM | POA: Diagnosis not present

## 2021-03-16 DIAGNOSIS — Z794 Long term (current) use of insulin: Secondary | ICD-10-CM | POA: Diagnosis not present

## 2021-03-22 ENCOUNTER — Telehealth: Payer: Self-pay | Admitting: Internal Medicine

## 2021-03-22 NOTE — Progress Notes (Signed)
  Chronic Care Management   Outreach Note  03/22/2021 Name: [name] MRN: [id number] DOB: [birth year]  Referred by: Hoyt Koch, MD Reason for referral : No chief complaint on file.   An unsuccessful telephone outreach was attempted today. The patient was referred to the pharmacist for assistance with care management and care coordination.   Follow Up Plan:   [place]

## 2021-03-23 ENCOUNTER — Other Ambulatory Visit: Payer: Self-pay | Admitting: Internal Medicine

## 2021-03-23 ENCOUNTER — Other Ambulatory Visit: Payer: Self-pay | Admitting: Family

## 2021-04-16 ENCOUNTER — Other Ambulatory Visit: Payer: Self-pay | Admitting: Cardiovascular Disease

## 2021-05-03 ENCOUNTER — Telehealth: Payer: Self-pay | Admitting: Internal Medicine

## 2021-05-03 NOTE — Chronic Care Management (AMB) (Signed)
  Chronic Care Management   Outreach Note  05/03/2021 Name: [name] MRN: [id number] DOB: 1930-12-30  Referred by: Hoyt Koch, MD Reason for referral : No chief complaint on file.   A second unsuccessful telephone outreach was attempted today. The patient was referred to pharmacist for assistance with care management and care coordination.  Follow Up Plan:   Lauretta Grill [facility]

## 2021-05-05 ENCOUNTER — Other Ambulatory Visit: Payer: Self-pay | Admitting: Cardiovascular Disease

## 2021-05-10 MED ORDER — FUROSEMIDE 80 MG PO TABS: 80.0000 mg | ORAL_TABLET | Freq: Every day | ORAL | 0 refills | Status: DC

## 2021-05-10 MED ORDER — HYDRALAZINE HCL 25 MG PO TABS: 25.0000 mg | ORAL_TABLET | Freq: Two times a day (BID) | ORAL | 0 refills | Status: DC

## 2021-05-10 NOTE — Addendum Note (Signed)
Addended by: Ulice Brilliant T on: 05/10/2021 08:23 AM   Modules accepted: Orders

## 2021-05-11 ENCOUNTER — Other Ambulatory Visit: Payer: Medicare Other

## 2021-05-11 ENCOUNTER — Encounter: Payer: Self-pay | Admitting: Internal Medicine

## 2021-05-11 DIAGNOSIS — R300 Dysuria: Secondary | ICD-10-CM | POA: Diagnosis not present

## 2021-05-11 NOTE — Telephone Encounter (Signed)
Patient son called to check on the status of the order.  Please see below and advise.

## 2021-05-12 ENCOUNTER — Other Ambulatory Visit: Payer: Self-pay | Admitting: Cardiovascular Disease

## 2021-05-12 LAB — URINALYSIS, ROUTINE W REFLEX MICROSCOPIC
Bilirubin Urine: NEGATIVE
Glucose, UA: NEGATIVE
Hgb urine dipstick: NEGATIVE
Ketones, ur: NEGATIVE
Nitrite: NEGATIVE
Protein, ur: NEGATIVE
RBC / HPF: NONE SEEN /HPF (ref 0–2)
Specific Gravity, Urine: 1.008 (ref 1.001–1.035)
pH: 5.5 (ref 5.0–8.0)

## 2021-05-12 LAB — MICROSCOPIC MESSAGE

## 2021-05-14 ENCOUNTER — Other Ambulatory Visit: Payer: Self-pay | Admitting: Internal Medicine

## 2021-05-14 MED ORDER — NITROFURANTOIN MONOHYD MACRO 100 MG PO CAPS: 100.0000 mg | ORAL_CAPSULE | Freq: Two times a day (BID) | ORAL | 0 refills | Status: DC

## 2021-06-02 ENCOUNTER — Other Ambulatory Visit: Payer: Self-pay | Admitting: Cardiovascular Disease

## 2021-06-05 ENCOUNTER — Other Ambulatory Visit: Payer: Self-pay | Admitting: Cardiovascular Disease

## 2021-06-09 ENCOUNTER — Other Ambulatory Visit: Payer: Self-pay | Admitting: Internal Medicine

## 2021-06-14 ENCOUNTER — Telehealth: Payer: Self-pay | Admitting: Internal Medicine

## 2021-06-14 NOTE — Chronic Care Management (AMB) (Signed)
  Chronic Care Management   Outreach Note  06/14/2021 Name: [name] MRN: [id number] DOB: 02-28-1931  Referred by: Hoyt Koch, MD Reason for referral : No chief complaint on file.   Third unsuccessful telephone outreach was attempted today. The patient was referred to the pharmacist for assistance with care management and care coordination.   Follow Up Plan:   Tatjana Dellinger [facility]uler

## 2021-06-17 ENCOUNTER — Other Ambulatory Visit: Payer: Self-pay | Admitting: Cardiovascular Disease

## 2021-06-21 ENCOUNTER — Other Ambulatory Visit: Payer: Self-pay

## 2021-06-21 ENCOUNTER — Ambulatory Visit (INDEPENDENT_AMBULATORY_CARE_PROVIDER_SITE_OTHER): Payer: Medicare Other

## 2021-06-21 VITALS — BP 122/80 | HR 76 | Temp 97.3°F | Ht 64.0 in | Wt 198.0 lb

## 2021-06-21 DIAGNOSIS — Z0000 Encounter for general adult medical examination without abnormal findings: Secondary | ICD-10-CM

## 2021-06-21 NOTE — Progress Notes (Addendum)
Subjective:   [name] is a 87 y.o. female who presents for Medicare Annual (Subsequent) preventive examination.  Review of Systems     Cardiac Risk Factors include: advanced age (>13mn, >>49women);diabetes mellitus;dyslipidemia;family history of premature cardiovascular disease;hypertension;obesity (BMI >30kg/m2)     Objective:    Today's Vitals   06/21/21 1536  BP: 122/80  Pulse: 76  Temp: (!) 97.3 F (36.3 C)  SpO2: 97%  Weight: 198 lb (89.8 kg)  Height: '5\' 4"'$  (1.626 m)  PainSc: 0-No pain   Body mass index is 33.99 kg/m.  Advanced Directives 06/21/2021 11/03/2020 12/11/2019 09/20/2019 09/18/2019 10/28/2016 10/05/2016  Does Patient Have a Medical Advance Directive? Yes No Yes No No Yes Yes  Type of Advance Directive Living will;Healthcare [place] of [place]  Does patient want to make changes to medical advance directive? No - Patient declined - No - Patient declined - - - No - Patient declined  Copy of [place]? No - copy requested - No - copy requested - - No - copy requested No - copy requested  Would patient like information on creating a medical advance directive? - Yes (ED - Information included in AVS) - No - Patient declined No - Patient declined - -  Pre-existing out of facility DNR order (yellow form or pink MOST form) - - - - - - -    Current Medications (verified) Outpatient Encounter Medications as of 06/21/2021  Medication Sig   acetaminophen (TYLENOL) 500 MG tablet Take 500 mg by mouth daily as needed for mild pain or headache.   allopurinol (ZYLOPRIM) 100 MG tablet TAKE 1 TABLET BY MOUTH EVERY DAY   ALPRAZolam (XANAX) 0.25 MG tablet TAKE 1 TABLET BY MOUTH AT BEDTIME AS NEEDED FOR ANXIETY.   amLODipine (NORVASC) 5 MG tablet TAKE 1 TABLET BY MOUTH EVERY DAY (FOR FUTURE REFILLS CONTACT DOCTOR)   aspirin EC 81 MG  tablet Take 81 mg by mouth every morning.    atorvastatin (LIPITOR) 40 MG tablet Take 40 mg by mouth every morning.    diclofenac Sodium (VOLTAREN) 1 % GEL Apply 4 g topically 4 (four) times daily. To affected joint. (Patient taking differently: Apply 4 g topically daily as needed (joint pain).)   ergocalciferol (VITAMIN D2) 50000 UNITS capsule Take 50,000 Units by mouth every Friday.    fluticasone (FLONASE) 50 MCG/ACT nasal spray Place 2 sprays into both nostrils daily.   fluticasone furoate-vilanterol (BREO ELLIPTA) 100-25 MCG/INH AEPB Inhale 1 puff into the lungs daily.   furosemide (LASIX) 80 MG tablet TAKE 1 TABLET BY MOUTH EVERY DAY (FOR FUTURE REFILLS CONTACT DOCTOR TO MAKE OFFICE VISIT)   gabapentin (NEURONTIN) 100 MG capsule TAKE 1 CAPSULE BY MOUTH THREE TIMES A DAY   hydrALAZINE (APRESOLINE) 25 MG tablet TAKE 1 TABLET (25 MG TOTAL) BY MOUTH 2 (TWO) TIMES DAILY. PLEASE CONTACT THE OFFICE FOR ADDITIONAL REFILLS FIRST ATTEMPT   insulin aspart (NOVOLOG) 100 UNIT/ML injection Inject 3-8 Units into the skin 3 (three) times daily with meals. Based on a sliding scale.   insulin glargine (LANTUS) 100 UNIT/ML injection Inject 0.25 mLs (25 Units total) into the skin at bedtime.   ipratropium (ATROVENT HFA) 17 MCG/ACT inhaler Inhale 2 puffs into the lungs every 4 (four) hours as needed for wheezing.   LACTASE PO Take 1 tablet by mouth 3 (three) times daily as needed (if  meal includes cheese or other dairy).   metoprolol tartrate (LOPRESSOR) 25 MG tablet Take 25 mg by mouth 2 (two) times daily.   Multiple Vitamin (MULTIVITAMIN WITH MINERALS) TABS tablet Take 1 tablet by mouth daily.   nitrofurantoin, macrocrystal-monohydrate, (MACROBID) 100 MG capsule Take 1 capsule (100 mg total) by mouth 2 (two) times daily.   ONE TOUCH ULTRA TEST test strip 1 each by Other route as directed.   oxyCODONE-acetaminophen (PERCOCET/ROXICET) 5-325 MG tablet Take 1 tablet by mouth every 4 (four) hours as needed for severe  pain.   OXYGEN Inhale 1.5 Lipoprotein Lipase Releasing Units into the lungs. 1.5L to 2L   Polyethyl Glycol-Propyl Glycol 0.4-0.3 % SOLN Apply 2 drops to eye daily as needed (dry eyes).    Probiotic Product (PROBIOTIC DAILY PO) Take 1 tablet by mouth daily.   RELION INSULIN SYR 0.3ML/31G 31G X 5/16" 0.3 ML MISC 1 each by Other route as needed (insulin).   No facility-administered encounter medications on file as of 06/21/2021.    Allergies (verified) Atenolol, Chlorhexidine, Codeine, Lactose intolerance (gi), Lodine [etodolac], Sulfa drugs cross reactors, Sulfamethoxazole, Benzonatate, Escitalopram oxalate, Metformin, Oxycodone-acetaminophen, and Pregabalin   History: Past Medical History:  Diagnosis Date   Anasarca 04/2009   found to be secondary to pericardial effusion with tamponade/pericardial window done   Chronic diastolic CHF (congestive heart failure) (HCC)    CKD (chronic kidney disease), stage III (HCC)    COPD (chronic obstructive pulmonary disease) ([place])    Coronary artery disease    a. mild-mod CAD 2004.   Diabetes mellitus    insulin dependent   Herpes labialis    Healing herpes labialis   Hypercholesterolemia    Hypertension    Lumbar radiculopathy    Lumbar scoliosis    Morbid obesity ([place])    Pericardial effusion 04/2009   a. 2010 with tamponade s/p window.   PONV (postoperative nausea and vomiting)    has not had any problems since 2001   Spondylolisthesis    Spondylosis    Past Surgical History:  Procedure Laterality Date   BACK SURGERY     microdiskectomy L3-4 on L/partial facetectomy 3-4 on L/removal of synovial cyst on left L3-4   BREAST BIOPSY     left   CARDIAC CATHETERIZATION  04/25/2003   L heart cath w/coronary angiography/R femoral artery/ EF 65%/no mitral regurgitation/ angiography L main coronary artery smooth & normal   EYE SURGERY     ophthalmoscopy/peritomy adjacent to the limbus from the 8 to 2:30 position superiorly   lumbar laminectomy      SUBXYPHOID PERICARDIAL WINDOW  04/2009   for pericardial effusion w/pericardial tamponade/sac drained w/20-French Baard drain/transesophageal echocardiogram confimed complete evacution of pericardial fluid   TOTAL HIP ARTHROPLASTY  03/06/2012   Procedure: TOTAL HIP ARTHROPLASTY;  Surgeon: Kerin Salen, MD;  Location: [facility];  Service: Orthopedics;  Laterality: Left;  DEPUY/PINNACLE, SUMMIT STEM, CUP POLY & CERAMIC   VAGINAL HYSTERECTOMY     Family History  Problem Relation Age of Onset   Heart attack Father    Hypertension Father    Stroke Mother    Angina Mother    Coronary artery disease Other    Social History   Socioeconomic History   Marital status: Widowed    Spouse name: Not on file   Number of children: Not on file   Years of education: Not on file   Highest education level: Not on file  Occupational History   Not on file  Tobacco Use   Smoking status: Never   Smokeless tobacco: Never  Substance and Sexual Activity   Alcohol use: No   Drug use: No   Sexual activity: Not Currently  Other Topics Concern   Not on file  Social History Narrative   Not on file   Social Determinants of Health   Financial Resource Strain: Low Risk    Difficulty of Paying Living Expenses: Not hard at all  Food Insecurity: No Food Insecurity   Worried About Charity fundraiser in the Last Year: Never true   [place] in the Last Year: Never true  Transportation Needs: No Transportation Needs   Lack of Transportation (Medical): No   Lack of Transportation (Non-Medical): No  Physical Activity: Insufficiently Active   Days of Exercise per Week: 5 days   Minutes of Exercise per Session: 10 min  Stress: No Stress Concern Present   Feeling of Stress : Not at all  Social Connections: Socially Isolated   Frequency of Communication with Friends and Family: More than three times a week   Frequency of Social Gatherings with Friends and Family: More than three times a week   Attends  Religious Services: Never   Marine scientist or Organizations: No   Attends Music therapist: Never   Marital Status: Never married    Tobacco Counseling Counseling given: Not Answered   Clinical Intake:  Pre-visit preparation completed: Yes  Pain : No/denies pain Pain Score: 0-No pain     BMI - recorded: 33.99 Nutritional Status: BMI > 30  Obese Nutritional Risks: None Diabetes: Yes CBG done?: No Did pt. bring in CBG monitor from home?: No  How often do you need to have someone help you when you read instructions, pamphlets, or other written materials from your doctor or pharmacy?: 1 - Never What is the last grade level you completed in school?: High School Graduate  Diabetic? yes  Interpreter Needed?: No  Information entered by :: Lisette Abu, LPN   Activities of Daily Living In your present state of health, do you have any difficulty performing the following activities: 06/21/2021 11/08/2020  Hearing? [place]? N -  Difficulty concentrating or making decisions? N -  Walking or climbing stairs? Y -  Dressing or bathing? N -  Doing errands, shopping? [place]pie Donning  Preparing Food and eating ? N -  Using the Toilet? N -  In the past six months, have you accidently leaked urine? Y -  Do you have problems with loss of bowel control? Y -  Managing your Medications? N -  Managing your Finances? N -  Housekeeping or managing your Housekeeping? Y -  Some recent data might be hidden    Patient Care Team: Hoyt Koch, MD as PCP - General (Internal Medicine) Nahser, Wonda Cheng, MD as PCP - Cardiology (Cardiology) Feliz Beam, MD as Referring Physician (Ophthalmology)  Indicate any recent Medical Services you may have received from other than [facility] providers in the past year (date may be approximate).     Assessment:   This is a routine wellness examination for Majel.  Hearing/Vision screen Hearing Screening - Comments:: Patient has  difficulty hearing.  No hearing aids.  Son is in the process of making appointment with Audiologist at [facility]:: Patient wears glasses.  Eye exam done annually with Dr. Dwana Melena.  Dietary issues and exercise activities discussed: Current Exercise Habits: The patient does  not participate in regular exercise at present, Exercise limited by: orthopedic condition(s);cardiac condition(s);respiratory conditions(s);neurologic condition(s)   Goals Addressed   None   Depression Screen PHQ 2/9 Scores 06/21/2021 12/30/2019 08/12/2017  PHQ - 2 Score 0 0 0    Fall Risk Fall Risk  06/21/2021 12/30/2019 08/12/2017 05/26/2017  Falls in the past year? 0 1 No No  Comment - - - Emmi Telephone Survey: data to providers prior to load  Number falls in past yr: 0 1 - -  Injury with Fall? 0 1 - -  Risk for fall due to : History of fall(s);Impaired balance/gait;Orthopedic patient History of fall(s) - -  Follow up Falls evaluation completed - - -    FALL RISK PREVENTION PERTAINING TO THE HOME:  Any stairs in or around the home? Yes  If so, are there any without handrails? No  Home free of loose throw rugs in walkways, pet beds, electrical cords, etc? Yes  Adequate lighting in your home to reduce risk of falls? Yes   ASSISTIVE DEVICES UTILIZED TO PREVENT FALLS:  Life alert? Yes  Use of a cane, walker or w/c? Yes  Grab bars in the bathroom? Yes  Shower chair or bench in shower? Yes  Elevated toilet seat or a handicapped toilet? Yes   TIMED UP AND GO:  Was the test performed? No .  Length of time to ambulate 10 feet: 0 sec.  (Patient in wheelchair and portable oxygen)  Cognitive Function: Normal cognitive status assessed by direct observation by this Nurse Health Advisor. No abnormalities found.          Immunizations Immunization History  Administered Date(s) Administered   Fluad Quad(high Dose 65+) 09/02/2019   Influenza, High Dose Seasonal PF 08/12/2017, 09/03/2018,  07/28/2020   Influenza,inj,Quad PF,6+ Mos 08/21/2016   Influenza-Unspecified 08/12/2017, 07/28/2020   PFIZER Comirnaty(Gray Top)Covid-19 Tri-Sucrose Vaccine 03/16/2021   PFIZER(Purple Top)SARS-COV-2 Vaccination 01/04/2020, 01/25/2020, 08/26/2020   Pneumococcal Conjugate-13 08/18/2014   Pneumococcal Polysaccharide-23 09/24/2019    TDAP status: Due, Education has been provided regarding the importance of this vaccine. Advised may receive this vaccine at local pharmacy or Health Dept. Aware to provide a copy of the vaccination record if obtained from local pharmacy or Health Dept. Verbalized acceptance and understanding.  Flu Vaccine status: Up to date  Pneumococcal vaccine status: Up to date  Covid-19 vaccine status: Information provided on how to obtain vaccines.   Qualifies for Shingles Vaccine? Yes   Zostavax completed No   Shingrix Completed?: No.    Education has been provided regarding the importance of this vaccine. Patient has been advised to call insurance company to determine out of pocket expense if they have not yet received this vaccine. Advised may also receive vaccine at local pharmacy or Health Dept. Verbalized acceptance and understanding.  Screening Tests Health Maintenance  Topic Date Due   TETANUS/TDAP  Never done   Zoster Vaccines- Shingrix (1 of 2) Never done   DEXA SCAN  Never done   FOOT EXAM  08/13/2018   OPHTHALMOLOGY EXAM  12/16/2019   INFLUENZA VACCINE  06/04/2021   COVID-19 Vaccine  Completed   PNA vac Low Risk Adult  Completed   HPV VACCINES  Aged Out    Health Maintenance  Health Maintenance Due  Topic Date Due   TETANUS/TDAP  Never done   Zoster Vaccines- Shingrix (1 of 2) Never done   DEXA SCAN  Never done   FOOT EXAM  08/13/2018   OPHTHALMOLOGY EXAM  12/16/2019  INFLUENZA VACCINE  06/04/2021    Colorectal cancer screening: No longer required.   Mammogram status: No longer required due to patient refusal.  Bone density: never  done  Lung Cancer Screening: (Low Dose CT Chest recommended if Age 66-80 years, 30 pack-year currently smoking OR have quit w/in 15years.) does not qualify.   Lung Cancer Screening Referral: no  Additional Screening:  Hepatitis C Screening: does not qualify; Completed no  Vision Screening: Recommended annual ophthalmology exams for early detection of glaucoma and other disorders of the eye. Is the patient up to date with their annual eye exam?  Yes  Who is the provider or what is the name of the office in which the patient attends annual eye exams? [facility]; Dr. Dwana Melena If pt is not established with a provider, would they like to be referred to a provider to establish care? No .   Dental Screening: Recommended annual dental exams for proper oral hygiene  Community Resource Referral / Chronic Care Management: CRR required this visit?  No   CCM required this visit?  No      Plan:     I have personally reviewed and noted the following in the patient's chart:   Medical and social history Use of alcohol, tobacco or illicit drugs  Current medications and supplements including opioid prescriptions.  Functional ability and status Nutritional status Physical activity Advanced directives List of other physicians Hospitalizations, surgeries, and ER visits in previous 12 months Vitals Screenings to include cognitive, depression, and falls Referrals and appointments  In addition, I have reviewed and discussed with patient certain preventive protocols, quality metrics, and best practice recommendations. A written personalized care plan for preventive services as well as general preventive health recommendations were provided to patient.    Sheral Flow, LPN   [id number]   Nurse Notes:  Medications reviewed with patient; yes opioid use noted.   Medical screening examination/treatment/procedure(s) were performed by non-physician  practitioner and as supervising physician I was immediately available for consultation/collaboration.  I agree with above. Lew Dawes, MD

## 2021-06-21 NOTE — Patient Instructions (Addendum)
[name] , Thank you for taking time to come for your Medicare Wellness Visit. I appreciate your ongoing commitment to your health goals. Please review the following plan we discussed and let me know if I can assist you in the future.   Screening recommendations/referrals: Colonoscopy: not a candidate due to age 87: last done 01/14/2013 Bone Density: never done Recommended yearly ophthalmology/optometry visit for glaucoma screening and checkup Recommended yearly dental visit for hygiene and checkup  Vaccinations: Influenza vaccine: 07/28/2020 Pneumococcal vaccine: 08/18/2014, 09/24/2019 Tdap vaccine: never done Shingles vaccine: never done   Covid-19: 01/04/2020, 01/25/2020, 08/26/2020, 03/16/2021  Advanced directives: Please bring a copy of your health care power of attorney and living will to the office at your convenience.  Conditions/risks identified: Yes; Client understands the importance of follow-up with providers by attending scheduled visits and discussed goals to eat healthier, increase physical activity, exercise the brain, socialize more, get enough sleep and make time for laughter.  Next appointment: Please schedule your next Medicare Wellness Visit with your Nurse Health Advisor in 1 year by calling [phone].   Preventive Care 66 Years and Older, Female Preventive care refers to lifestyle choices and visits with your health care provider that can promote health and wellness. What does preventive care include? A yearly physical exam. This is also called an annual well check. Dental exams once or twice a year. Routine eye exams. Ask your health care provider how often you should have your eyes checked. Personal lifestyle choices, including: Daily care of your teeth and gums. Regular physical activity. Eating a healthy diet. Avoiding tobacco and drug use. Limiting alcohol use. Practicing safe sex. Taking low-dose aspirin every day. Taking vitamin and mineral  supplements as recommended by your health care provider. What happens during an annual well check? The services and screenings done by your health care provider during your annual well check will depend on your age, overall health, lifestyle risk factors, and family history of disease. Counseling  Your health care provider may ask you questions about your: Alcohol use. Tobacco use. Drug use. Emotional well-being. Home and relationship well-being. Sexual activity. Eating habits. History of falls. Memory and ability to understand (cognition). Work and work Statistician. Reproductive health. Screening  You may have the following tests or measurements: Height, weight, and BMI. Blood pressure. Lipid and cholesterol levels. These may be checked every 5 years, or more frequently if you are over 10 years old. Skin check. Lung cancer screening. You may have this screening every year starting at age 58 if you have a 30-pack-year history of smoking and currently smoke or have quit within the past 15 years. Fecal occult blood test (FOBT) of the stool. You may have this test every year starting at age 28. Flexible sigmoidoscopy or colonoscopy. You may have a sigmoidoscopy every 5 years or a colonoscopy every 10 years starting at age 78. Hepatitis C blood test. Hepatitis B blood test. Sexually transmitted disease (STD) testing. Diabetes screening. This is done by checking your blood sugar (glucose) after you have not eaten for a while (fasting). You may have this done every 1-3 years. Bone density scan. This is done to screen for osteoporosis. You may have this done starting at age 66. Mammogram. This may be done every 1-2 years. Talk to your health care provider about how often you should have regular mammograms. Talk with your health care provider about your test results, treatment options, and if necessary, the need for more tests. Vaccines  Your health care  provider may recommend certain  vaccines, such as: Influenza vaccine. This is recommended every year. Tetanus, diphtheria, and acellular pertussis (Tdap, Td) vaccine. You may need a Td booster every 10 years. Zoster vaccine. You may need this after age 52. Pneumococcal 13-valent conjugate (PCV13) vaccine. One dose is recommended after age 33. Pneumococcal polysaccharide (PPSV23) vaccine. One dose is recommended after age 64. Talk to your health care provider about which screenings and vaccines you need and how often you need them. This information is not intended to replace advice given to you by your health care provider. Make sure you discuss any questions you have with your health care provider. Document Released: 11/17/2015 Document Revised: 07/10/2016 Document Reviewed: 08/22/2015 Elsevier Interactive Patient Education  2017 [place] Prevention in the Home Falls can cause injuries. They can happen to people of all ages. There are many things you can do to make your home safe and to help prevent falls. What can I do on the outside of my home? Regularly fix the edges of walkways and driveways and fix any cracks. Remove anything that might make you trip as you walk through a door, such as a raised step or threshold. Trim any bushes or trees on the path to your home. Use bright outdoor lighting. Clear any walking paths of anything that might make someone trip, such as rocks or tools. Regularly check to see if handrails are loose or broken. Make sure that both sides of any steps have handrails. Any raised decks and porches should have guardrails on the edges. Have any leaves, snow, or ice cleared regularly. Use sand or salt on walking paths during winter. Clean up any spills in your garage right away. This includes oil or grease spills. What can I do in the bathroom? Use night lights. Install grab bars by the toilet and in the tub and shower. Do not use towel bars as grab bars. Use non-skid mats or decals in  the tub or shower. If you need to sit down in the shower, use a plastic, non-slip stool. Keep the floor dry. Clean up any water that spills on the floor as soon as it happens. Remove soap buildup in the tub or shower regularly. Attach bath mats securely with double-sided non-slip rug tape. Do not have throw rugs and other things on the floor that can make you trip. What can I do in the bedroom? Use night lights. Make sure that you have a light by your bed that is easy to reach. Do not use any sheets or blankets that are too big for your bed. They should not hang down onto the floor. Have a firm chair that has side arms. You can use this for support while you get dressed. Do not have throw rugs and other things on the floor that can make you trip. What can I do in the kitchen? Clean up any spills right away. Avoid walking on wet floors. Keep items that you use a lot in easy-to-reach places. If you need to reach something above you, use a strong step stool that has a grab bar. Keep electrical cords out of the way. Do not use floor polish or wax that makes floors slippery. If you must use wax, use non-skid floor wax. Do not have throw rugs and other things on the floor that can make you trip. What can I do with my stairs? Do not leave any items on the stairs. Make sure that there are handrails on both  sides of the stairs and use them. Fix handrails that are broken or loose. Make sure that handrails are as long as the stairways. Check any carpeting to make sure that it is firmly attached to the stairs. Fix any carpet that is loose or worn. Avoid having throw rugs at the top or bottom of the stairs. If you do have throw rugs, attach them to the floor with carpet tape. Make sure that you have a light switch at the top of the stairs and the bottom of the stairs. If you do not have them, ask someone to add them for you. What else can I do to help prevent falls? Wear shoes that: Do not have high  heels. Have rubber bottoms. Are comfortable and fit you well. Are closed at the toe. Do not wear sandals. If you use a stepladder: Make sure that it is fully opened. Do not climb a closed stepladder. Make sure that both sides of the stepladder are locked into place. Ask someone to hold it for you, if possible. Clearly mark and make sure that you can see: Any grab bars or handrails. First and last steps. Where the edge of each step is. Use tools that help you move around (mobility aids) if they are needed. These include: Canes. Walkers. Scooters. Crutches. Turn on the lights when you go into a dark area. Replace any light bulbs as soon as they burn out. Set up your furniture so you have a clear path. Avoid moving your furniture around. If any of your floors are uneven, fix them. If there are any pets around you, be aware of where they are. Review your medicines with your doctor. Some medicines can make you feel dizzy. This can increase your chance of falling. Ask your doctor what other things that you can do to help prevent falls. This information is not intended to replace advice given to you by your health care provider. Make sure you discuss any questions you have with your health care provider. Document Released: 08/17/2009 Document Revised: 03/28/2016 Document Reviewed: 11/25/2014 Elsevier Interactive Patient Education  2017 [employer].

## 2021-06-28 ENCOUNTER — Other Ambulatory Visit: Payer: Self-pay | Admitting: Cardiovascular Disease

## 2021-07-02 ENCOUNTER — Other Ambulatory Visit: Payer: Self-pay | Admitting: Cardiovascular Disease

## 2021-07-17 ENCOUNTER — Other Ambulatory Visit: Payer: Self-pay | Admitting: Cardiovascular Disease

## 2021-07-20 ENCOUNTER — Other Ambulatory Visit: Payer: Self-pay | Admitting: Emergency Medicine

## 2021-07-20 ENCOUNTER — Encounter: Payer: Self-pay | Admitting: Internal Medicine

## 2021-07-20 DIAGNOSIS — R300 Dysuria: Secondary | ICD-10-CM

## 2021-07-21 ENCOUNTER — Encounter: Payer: Self-pay | Admitting: Internal Medicine

## 2021-07-21 LAB — URINALYSIS, ROUTINE W REFLEX MICROSCOPIC
Bilirubin Urine: NEGATIVE
Glucose, UA: NEGATIVE
Hgb urine dipstick: NEGATIVE
Ketones, ur: NEGATIVE
Nitrite: POSITIVE — AB
Protein, ur: NEGATIVE
RBC / HPF: NONE SEEN /HPF (ref 0–2)
Specific Gravity, Urine: 1.008 (ref 1.001–1.035)
WBC, UA: >=60 /HPF — AB (ref 0–5)
pH: 5.5 (ref 5.0–8.0)

## 2021-07-21 LAB — EXTRA URINE SPECIMEN

## 2021-07-21 LAB — MICROSCOPIC MESSAGE

## 2021-07-23 ENCOUNTER — Telehealth: Payer: Self-pay

## 2021-07-23 ENCOUNTER — Other Ambulatory Visit: Payer: Self-pay | Admitting: Emergency Medicine

## 2021-07-23 MED ORDER — CEFUROXIME AXETIL 500 MG PO TABS: 500.0000 mg | ORAL_TABLET | Freq: Two times a day (BID) | ORAL | 0 refills | Status: AC

## 2021-07-23 NOTE — Progress Notes (Signed)
This is a UTI.  Needs urine culture and also antibiotics.

## 2021-07-23 NOTE — Telephone Encounter (Signed)
Left message informing patient to call back.

## 2021-07-23 NOTE — Progress Notes (Signed)
Needs follow-up with Dr. Sharlet Salina this week.

## 2021-07-23 NOTE — Progress Notes (Signed)
Prescription for Ceftin 500 mg twice a day for 7 days sent to pharmacy of record.

## 2021-07-23 NOTE — Telephone Encounter (Signed)
-----   Message from [facility], MD sent at 07/23/2021 11:54 AM EDT ----- Prescription for Ceftin 500 mg twice a day for 7 days sent to pharmacy of record.

## 2021-07-27 ENCOUNTER — Other Ambulatory Visit: Payer: Self-pay | Admitting: Cardiovascular Disease

## 2021-08-05 ENCOUNTER — Other Ambulatory Visit: Payer: Self-pay | Admitting: Cardiovascular Disease

## 2021-08-05 ENCOUNTER — Other Ambulatory Visit: Payer: Self-pay | Admitting: Internal Medicine

## 2021-08-09 ENCOUNTER — Encounter: Payer: Self-pay | Admitting: Internal Medicine

## 2021-08-09 ENCOUNTER — Other Ambulatory Visit: Payer: Self-pay

## 2021-08-09 ENCOUNTER — Ambulatory Visit (INDEPENDENT_AMBULATORY_CARE_PROVIDER_SITE_OTHER): Payer: Medicare Other | Admitting: Internal Medicine

## 2021-08-09 VITALS — BP 132/66 | HR 65 | Temp 98.2°F | Ht 64.0 in | Wt 201.0 lb

## 2021-08-09 DIAGNOSIS — N390 Urinary tract infection, site not specified: Secondary | ICD-10-CM | POA: Diagnosis not present

## 2021-08-09 DIAGNOSIS — R300 Dysuria: Secondary | ICD-10-CM

## 2021-08-09 DIAGNOSIS — N12 Tubulo-interstitial nephritis, not specified as acute or chronic: Secondary | ICD-10-CM | POA: Diagnosis not present

## 2021-08-09 DIAGNOSIS — I10 Essential (primary) hypertension: Secondary | ICD-10-CM | POA: Diagnosis not present

## 2021-08-09 DIAGNOSIS — I5032 Chronic diastolic (congestive) heart failure: Secondary | ICD-10-CM | POA: Diagnosis not present

## 2021-08-09 MED ORDER — CEFTRIAXONE SODIUM 1 G IJ SOLR
1.0000 g | Freq: Once | INTRAMUSCULAR | Status: AC
  Administered 2021-08-09: 1 g via INTRAMUSCULAR

## 2021-08-09 MED ORDER — CEPHALEXIN 500 MG PO CAPS: ORAL_CAPSULE | ORAL | 3 refills | Status: DC

## 2021-08-09 MED ORDER — CIPROFLOXACIN HCL 500 MG PO TABS: 500.0000 mg | ORAL_TABLET | Freq: Two times a day (BID) | ORAL | 0 refills | Status: AC

## 2021-08-09 NOTE — Progress Notes (Signed)
Patient ID: [name], female   DOB: 12-12-1930, 87 y.o.   MRN: [id number]        Chief Complaint: follow up c/o 3 days left flank pain, dysuria       HPI:  [name] is a 87 y.o. female here with c/o 3 days fatigue, malaise, dysuria and left flank pain differrent from her recurring more chronic back pain, but Denies urinary symptoms such as frequency, urgency, hematuria or n/v, fever, chills.  Has been hospd in past with similar symptoms.  No confusion.  Pt denies chest pain, increased sob or doe, wheezing, orthopnea, PND, increased LE swelling, palpitations, dizziness or syncope.   Pt denies polydipsia, polyuria or new focal neuro s/s.  Here with son who requests transfer to my care as PCP not available enough - I declined       Wt Readings from Last 3 Encounters:  08/09/21 201 lb (91.2 kg)  06/21/21 198 lb (89.8 kg)  11/17/20 189 lb 3.2 oz (85.8 kg)   BP Readings from Last 3 Encounters:  08/09/21 132/66  06/21/21 122/80  02/15/21 130/66         Past Medical History:  Diagnosis Date   Anasarca 04/2009   found to be secondary to pericardial effusion with tamponade/pericardial window done   Chronic diastolic CHF (congestive heart failure) (HCC)    CKD (chronic kidney disease), stage III (HCC)    COPD (chronic obstructive pulmonary disease) ([place])    Coronary artery disease    a. mild-mod CAD 2004.   Diabetes mellitus    insulin dependent   Herpes labialis    Healing herpes labialis   Hypercholesterolemia    Hypertension    Lumbar radiculopathy    Lumbar scoliosis    Morbid obesity ([place])    Pericardial effusion 04/2009   a. 2010 with tamponade s/p window.   PONV (postoperative nausea and vomiting)    has not had any problems since 2001   Spondylolisthesis    Spondylosis    Past Surgical History:  Procedure Laterality Date   BACK SURGERY     microdiskectomy L3-4 on L/partial facetectomy 3-4 on L/removal of synovial cyst on left L3-4   BREAST BIOPSY     left   CARDIAC  CATHETERIZATION  04/25/2003   L heart cath w/coronary angiography/R femoral artery/ EF 65%/no mitral regurgitation/ angiography L main coronary artery smooth & normal   EYE SURGERY     ophthalmoscopy/peritomy adjacent to the limbus from the 8 to 2:30 position superiorly   lumbar laminectomy     SUBXYPHOID PERICARDIAL WINDOW  04/2009   for pericardial effusion w/pericardial tamponade/sac drained w/20-French Baard drain/transesophageal echocardiogram confimed complete evacution of pericardial fluid   TOTAL HIP ARTHROPLASTY  03/06/2012   Procedure: TOTAL HIP ARTHROPLASTY;  Surgeon: Kerin Salen, MD;  Location: [facility];  Service: Orthopedics;  Laterality: Left;  DEPUY/PINNACLE, SUMMIT STEM, CUP POLY & CERAMIC   VAGINAL HYSTERECTOMY      reports that she has never smoked. She has never used smokeless tobacco. She reports that she does not drink alcohol and does not use drugs. family history includes Angina in her mother; Coronary artery disease in an other family member; Heart attack in her father; Hypertension in her father; Stroke in her mother. Allergies  Allergen Reactions   Atenolol Other (See Comments)    weakness   Chlorhexidine    Codeine Nausea And Vomiting    Hydrocodone is ok   Lactose Intolerance (Gi) Diarrhea  Lodine [Etodolac] Other (See Comments)    dizziness   Sulfa Drugs Cross Reactors Nausea Only   Sulfamethoxazole Nausea And Vomiting   Benzonatate Nausea And Vomiting   Escitalopram Oxalate Other (See Comments)    mild hallucinations   Metformin Nausea And Vomiting    Reaction to IR and XL   Oxycodone-Acetaminophen Other (See Comments)    Percocet does not relieve pts' pain   Pregabalin Other (See Comments)    Unknown reaction   Current Outpatient Medications on File Prior to Visit  Medication Sig Dispense Refill   acetaminophen (TYLENOL) 500 MG tablet Take 500 mg by mouth daily as needed for mild pain or headache.     allopurinol (ZYLOPRIM) 100 MG tablet TAKE 1  TABLET BY MOUTH EVERY DAY 90 tablet 1   ALPRAZolam (XANAX) 0.25 MG tablet TAKE 1 TABLET BY MOUTH AT BEDTIME AS NEEDED FOR ANXIETY. 30 tablet 5   amLODipine (NORVASC) 5 MG tablet TAKE 1 TABLET BY MOUTH EVERY DAY (FOR FUTURE REFILLS CONTACT DOCTOR) 15 tablet 0   aspirin EC 81 MG tablet Take 81 mg by mouth every morning.      atorvastatin (LIPITOR) 40 MG tablet Take 40 mg by mouth every morning.      BREO ELLIPTA 100-25 MCG/INH AEPB TAKE 1 PUFF BY MOUTH EVERY DAY 60 each 6   diclofenac Sodium (VOLTAREN) 1 % GEL Apply 4 g topically 4 (four) times daily. To affected joint. (Patient taking differently: Apply 4 g topically daily as needed (joint pain).) 100 g 11   ergocalciferol (VITAMIN D2) 50000 UNITS capsule Take 50,000 Units by mouth every Friday.      fluticasone (FLONASE) 50 MCG/ACT nasal spray Place 2 sprays into both nostrils daily. 16 g 1   furosemide (LASIX) 80 MG tablet Take 1 tablet (80 mg total) by mouth daily. Please call our office to make overdue appt  For future refills. Thank you. 3rd attempt 15 tablet 0   gabapentin (NEURONTIN) 100 MG capsule TAKE 1 CAPSULE BY MOUTH THREE TIMES A DAY 90 capsule 2   hydrALAZINE (APRESOLINE) 25 MG tablet Take 1 tablet (25 mg total) by mouth 2 (two) times daily. PLEASE CONTACT THE OFFICE FOR OVERDUE APPT FOR ADDITIONAL REFILLS 3rd  ATTEMPT 30 tablet 0   insulin aspart (NOVOLOG) 100 UNIT/ML injection Inject 3-8 Units into the skin 3 (three) times daily with meals. Based on a sliding scale.     insulin glargine (LANTUS) 100 UNIT/ML injection Inject 0.25 mLs (25 Units total) into the skin at bedtime. 10 mL 1   ipratropium (ATROVENT HFA) 17 MCG/ACT inhaler Inhale 2 puffs into the lungs every 4 (four) hours as needed for wheezing. 1 each 12   LACTASE PO Take 1 tablet by mouth 3 (three) times daily as needed (if meal includes cheese or other dairy).     metoprolol tartrate (LOPRESSOR) 25 MG tablet Take 25 mg by mouth 2 (two) times daily.     Multiple Vitamin  (MULTIVITAMIN WITH MINERALS) TABS tablet Take 1 tablet by mouth daily.     ONE TOUCH ULTRA TEST test strip 1 each by Other route as directed.     oxyCODONE-acetaminophen (PERCOCET/ROXICET) 5-325 MG tablet Take 1 tablet by mouth every 4 (four) hours as needed for severe pain. 30 tablet 0   OXYGEN Inhale 1.5 Lipoprotein Lipase Releasing Units into the lungs. 1.5L to 2L     Polyethyl Glycol-Propyl Glycol 0.4-0.3 % SOLN Apply 2 drops to eye daily as needed (dry eyes).  Probiotic Product (PROBIOTIC DAILY PO) Take 1 tablet by mouth daily.     RELION INSULIN SYR 0.3ML/31G 31G X 5/16" 0.3 ML MISC 1 each by Other route as needed (insulin).     No current facility-administered medications on file prior to visit.        ROS:  All others reviewed and negative.  Objective        PE:  BP 132/66 (BP Location: Right Arm, Patient Position: Sitting, Cuff Size: Large)   Pulse 65   Temp 98.2 F (36.8 C) (Oral)   Ht 5\' 4"  (1.626 m)   Wt 201 lb (91.2 kg)   SpO2 96%   BMI 34.50 kg/m                 Constitutional: Pt appears in NAD               HENT: Head: NCAT.                Right Ear: External ear normal.                 Left Ear: External ear normal.                Eyes: . Pupils are equal, round, and reactive to light. Conjunctivae and EOM are normal               Nose: without d/c or deformity               Neck: Neck supple. Gross normal ROM               Cardiovascular: Normal rate and regular rhythm.                 Pulmonary/Chest: Effort normal and breath sounds without rales or wheezing.                Abd:  Soft, NT, ND, + BS, no organomegaly but with midl left flank tender               Neurological: Pt is alert. At baseline orientation, motor grossly intact               Skin: Skin is warm. No rashes, no other new lesions, LE edema - none               Psychiatric: Pt behavior is normal without agitation   Micro: none  Cardiac tracings I have personally interpreted today:   none  Pertinent Radiological findings (summarize): none   Lab Results  Component Value Date   WBC 12.8 (H) 11/17/2020   HGB 11.9 (L) 11/17/2020   HCT 38.4 11/17/2020   PLT 222.0 11/17/2020   GLUCOSE 156 (H) 11/17/2020   CHOL 146 06/28/2019   TRIG 230 (A) 06/28/2019   HDL 28 (A) 06/28/2019   LDLCALC 72 06/28/2019   ALT 28 11/17/2020   AST 19 11/17/2020   NA 141 11/17/2020   K 4.0 11/17/2020   CL 104 11/17/2020   CREATININE 1.27 (H) 11/17/2020   BUN 34 (H) 11/17/2020   CO2 29 11/17/2020   TSH 0.259 (L) 11/04/2020   INR 1.01 03/04/2012   HGBA1C 7.9 (H) 11/04/2020   Assessment/Plan:  [name] is a 87 y.o. White or Caucasian [1] female with  has a past medical history of Anasarca (04/2009), Chronic diastolic CHF (congestive heart failure) ([place]), CKD (chronic kidney disease), stage III ([place]), COPD (chronic obstructive pulmonary disease) ([place]), Coronary  artery disease, Diabetes mellitus, Herpes labialis, Hypercholesterolemia, Hypertension, Lumbar radiculopathy, Lumbar scoliosis, Morbid obesity ([place]), Pericardial effusion (04/2009), PONV (postoperative nausea and vomiting), Spondylolisthesis, and Spondylosis.  Pyelonephritis of left kidney Mild, for rocephin Im 1 gm, and cipro course asd, urine culture, to f/u any worsening symptoms or concerns  Chronic diastolic CHF (congestive heart failure) (HCC) Stable overall, cont same tx - lasix 80,  to f/u any worsening symptoms or concerns  Dysuria For urine cx as above  Hypertension, essential BP Readings from Last 3 Encounters:  08/09/21 132/66  06/21/21 122/80  02/15/21 130/66   Stable, pt to continue medical treatment lopressor, hydralzine, norvasc  Followup: Return if symptoms worsen or fail to improve.  Cathlean Cower, MD 08/11/2021 5:50 PM [facility]

## 2021-08-09 NOTE — Patient Instructions (Addendum)
You had the antibiotic shot today (rocephin)  Please take all new medication as prescribed - the cipro  Your specimen was sent for culture  Please also take the 3 times weekly cephalexin after the cipro is finished to help prevent  Please continue all other medications as before, and refills have been done if requested.  Please have the pharmacy call with any other refills you may need.  Please continue your efforts at being more active, low cholesterol diet, and weight control.  Please keep your appointments with your specialists as you may have planned

## 2021-08-10 ENCOUNTER — Encounter: Payer: Self-pay | Admitting: Internal Medicine

## 2021-08-10 LAB — URINALYSIS, ROUTINE W REFLEX MICROSCOPIC
Bilirubin Urine: NEGATIVE
Ketones, ur: NEGATIVE
Nitrite: NEGATIVE
Specific Gravity, Urine: 1.01 (ref 1.000–1.030)
Total Protein, Urine: NEGATIVE
Urine Glucose: NEGATIVE
Urobilinogen, UA: 0.2 (ref 0.0–1.0)
pH: 5.5 (ref 5.0–8.0)

## 2021-08-11 ENCOUNTER — Other Ambulatory Visit: Payer: Self-pay | Admitting: Cardiovascular Disease

## 2021-08-11 ENCOUNTER — Encounter: Payer: Self-pay | Admitting: Internal Medicine

## 2021-08-11 NOTE — Assessment & Plan Note (Signed)
For urine cx as above

## 2021-08-11 NOTE — Assessment & Plan Note (Addendum)
Stable overall, cont same tx - lasix 80,  to f/u any worsening symptoms or concerns

## 2021-08-11 NOTE — Assessment & Plan Note (Signed)
Mild, for rocephin Im 1 gm, and cipro course asd, urine culture, to f/u any worsening symptoms or concerns

## 2021-08-11 NOTE — Assessment & Plan Note (Signed)
BP Readings from Last 3 Encounters:  08/09/21 132/66  06/21/21 122/80  02/15/21 130/66   Stable, pt to continue medical treatment lopressor, hydralzine, norvasc

## 2021-08-12 ENCOUNTER — Encounter: Payer: Self-pay | Admitting: Internal Medicine

## 2021-08-12 LAB — URINE CULTURE

## 2021-08-13 ENCOUNTER — Other Ambulatory Visit: Payer: Self-pay | Admitting: Cardiovascular Disease

## 2021-08-17 ENCOUNTER — Encounter: Payer: Self-pay | Admitting: Internal Medicine

## 2021-08-17 DIAGNOSIS — E1142 Type 2 diabetes mellitus with diabetic polyneuropathy: Secondary | ICD-10-CM

## 2021-08-20 DIAGNOSIS — Z23 Encounter for immunization: Secondary | ICD-10-CM | POA: Diagnosis not present

## 2021-09-10 ENCOUNTER — Other Ambulatory Visit: Payer: Self-pay | Admitting: Internal Medicine

## 2021-09-10 ENCOUNTER — Other Ambulatory Visit: Payer: Self-pay | Admitting: Cardiovascular Disease

## 2021-09-11 ENCOUNTER — Telehealth: Payer: Self-pay | Admitting: Internal Medicine

## 2021-10-10 ENCOUNTER — Other Ambulatory Visit: Payer: Self-pay | Admitting: Internal Medicine

## 2021-10-10 ENCOUNTER — Other Ambulatory Visit: Payer: Self-pay | Admitting: Cardiovascular Disease

## 2021-10-19 DIAGNOSIS — H35033 Hypertensive retinopathy, bilateral: Secondary | ICD-10-CM | POA: Diagnosis not present

## 2021-10-19 DIAGNOSIS — Z794 Long term (current) use of insulin: Secondary | ICD-10-CM | POA: Diagnosis not present

## 2021-10-19 DIAGNOSIS — Z23 Encounter for immunization: Secondary | ICD-10-CM | POA: Diagnosis not present

## 2021-10-19 DIAGNOSIS — E113553 Type 2 diabetes mellitus with stable proliferative diabetic retinopathy, bilateral: Secondary | ICD-10-CM | POA: Diagnosis not present

## 2021-10-19 DIAGNOSIS — H35372 Puckering of macula, left eye: Secondary | ICD-10-CM | POA: Diagnosis not present

## 2021-10-19 DIAGNOSIS — Z961 Presence of intraocular lens: Secondary | ICD-10-CM | POA: Diagnosis not present

## 2021-10-19 DIAGNOSIS — D3131 Benign neoplasm of right choroid: Secondary | ICD-10-CM | POA: Diagnosis not present

## 2021-10-25 ENCOUNTER — Other Ambulatory Visit: Payer: Self-pay | Admitting: Cardiovascular Disease

## 2021-11-01 ENCOUNTER — Other Ambulatory Visit: Payer: Self-pay

## 2021-11-01 ENCOUNTER — Ambulatory Visit (INDEPENDENT_AMBULATORY_CARE_PROVIDER_SITE_OTHER): Payer: Medicare Other | Admitting: Internal Medicine

## 2021-11-01 ENCOUNTER — Encounter: Payer: Self-pay | Admitting: Internal Medicine

## 2021-11-01 VITALS — BP 118/68 | HR 67 | Temp 99.8°F | Ht 64.0 in

## 2021-11-01 DIAGNOSIS — Z794 Long term (current) use of insulin: Secondary | ICD-10-CM

## 2021-11-01 DIAGNOSIS — R300 Dysuria: Secondary | ICD-10-CM

## 2021-11-01 DIAGNOSIS — R062 Wheezing: Secondary | ICD-10-CM

## 2021-11-01 DIAGNOSIS — E1142 Type 2 diabetes mellitus with diabetic polyneuropathy: Secondary | ICD-10-CM | POA: Diagnosis not present

## 2021-11-01 DIAGNOSIS — I10 Essential (primary) hypertension: Secondary | ICD-10-CM | POA: Diagnosis not present

## 2021-11-01 MED ORDER — CEFTRIAXONE SODIUM 1 G IJ SOLR
1.0000 g | Freq: Once | INTRAMUSCULAR | Status: AC
Start: 2021-11-01 — End: 2021-11-01
  Administered 2021-11-01: 16:00:00 1 g via INTRAMUSCULAR

## 2021-11-01 MED ORDER — PREDNISONE 10 MG PO TABS: ORAL_TABLET | ORAL | 0 refills | Status: DC

## 2021-11-01 MED ORDER — ALBUTEROL SULFATE HFA 108 (90 BASE) MCG/ACT IN AERS: 2.0000 | INHALATION_SPRAY | Freq: Four times a day (QID) | RESPIRATORY_TRACT | 3 refills | Status: DC | PRN

## 2021-11-01 MED ORDER — LEVOFLOXACIN 500 MG PO TABS: 500.0000 mg | ORAL_TABLET | Freq: Every day | ORAL | 0 refills | Status: AC

## 2021-11-01 NOTE — Patient Instructions (Signed)
You had the antibiotic shot today (rocephin)  Please take all new medication as prescribed - the pill antibiotic, prednisone  Please continue all other medications as before, including the inhaler refill  Please have the pharmacy call with any other refills you may need.  Please continue your efforts at being more active, low cholesterol diet, and weight control.  Please keep your appointments with your specialists as you may have planned  Please go to the LAB at the blood drawing area for the tests to be done - just the urine culture today  You will be contacted by phone if any changes need to be made immediately.  Otherwise, you will receive a letter about your results with an explanation, but please check with MyChart first.  Please remember to sign up for MyChart if you have not done so, as this will be important to you in the future with finding out test results, communicating by private email, and scheduling acute appointments online when needed.

## 2021-11-01 NOTE — Progress Notes (Signed)
Patient ID: [name], female   DOB: 1931/07/19, 87 y.o.   MRN: [id number]        Chief Complaint: follow up dysuria, htn, dysuria       HPI:  [name] is a 87 y.o. female here with c/o 3 days onset worsening dysuria with frequency here with family; maybe more confused as well but difficult to tell; Denies urinary symptoms such as urgency, flank pain, hematuria or n/v, fever, chills.  Pt denies chest pain, orthopnea, PND, increased LE swelling, palpitations, dizziness or syncope, except has had mild worsening non prod cough, wheezing sob for 1-2 fays as well..   Pt denies polydipsia, polyuria, or new focal neuro s/s.   Pt denies fever, wt loss, night sweats, loss of appetite, or other constitutional symptoms       Wt Readings from Last 3 Encounters:  08/09/21 201 lb (91.2 kg)  06/21/21 198 lb (89.8 kg)  11/17/20 189 lb 3.2 oz (85.8 kg)   BP Readings from Last 3 Encounters:  11/01/21 118/68  08/09/21 132/66  06/21/21 122/80         Past Medical History:  Diagnosis Date   Anasarca 04/2009   found to be secondary to pericardial effusion with tamponade/pericardial window done   Chronic diastolic CHF (congestive heart failure) (HCC)    CKD (chronic kidney disease), stage III (HCC)    COPD (chronic obstructive pulmonary disease) ([place])    Coronary artery disease    a. mild-mod CAD 2004.   Diabetes mellitus    insulin dependent   Herpes labialis    Healing herpes labialis   Hypercholesterolemia    Hypertension    Lumbar radiculopathy    Lumbar scoliosis    Morbid obesity ([place])    Pericardial effusion 04/2009   a. 2010 with tamponade s/p window.   PONV (postoperative nausea and vomiting)    has not had any problems since 2001   Spondylolisthesis    Spondylosis    Past Surgical History:  Procedure Laterality Date   BACK SURGERY     microdiskectomy L3-4 on L/partial facetectomy 3-4 on L/removal of synovial cyst on left L3-4   BREAST BIOPSY     left   CARDIAC CATHETERIZATION   04/25/2003   L heart cath w/coronary angiography/R femoral artery/ EF 65%/no mitral regurgitation/ angiography L main coronary artery smooth & normal   EYE SURGERY     ophthalmoscopy/peritomy adjacent to the limbus from the 8 to 2:30 position superiorly   lumbar laminectomy     SUBXYPHOID PERICARDIAL WINDOW  04/2009   for pericardial effusion w/pericardial tamponade/sac drained w/20-French Baard drain/transesophageal echocardiogram confimed complete evacution of pericardial fluid   TOTAL HIP ARTHROPLASTY  03/06/2012   Procedure: TOTAL HIP ARTHROPLASTY;  Surgeon: Kerin Salen, MD;  Location: [facility];  Service: Orthopedics;  Laterality: Left;  DEPUY/PINNACLE, SUMMIT STEM, CUP POLY & CERAMIC   VAGINAL HYSTERECTOMY      reports that she has never smoked. She has never used smokeless tobacco. She reports that she does not drink alcohol and does not use drugs. family history includes Angina in her mother; Coronary artery disease in an other family member; Heart attack in her father; Hypertension in her father; Stroke in her mother. Allergies  Allergen Reactions   Atenolol Other (See Comments)    weakness   Chlorhexidine    Codeine Nausea And Vomiting    Hydrocodone is ok   Lactose Intolerance (Gi) Diarrhea   Lodine [Etodolac] Other (See Comments)  dizziness   Sulfa Drugs Cross Reactors Nausea Only   Sulfamethoxazole Nausea And Vomiting   Benzonatate Nausea And Vomiting   Escitalopram Oxalate Other (See Comments)    mild hallucinations   Metformin Nausea And Vomiting    Reaction to IR and XL   Oxycodone-Acetaminophen Other (See Comments)    Percocet does not relieve pts' pain   Pregabalin Other (See Comments)    Laurie reaction   Current Outpatient Medications on File Prior to Visit  Medication Sig Dispense Refill   acetaminophen (TYLENOL) 500 MG tablet Take 500 mg by mouth daily as needed for mild pain or headache.     allopurinol (ZYLOPRIM) 100 MG tablet TAKE 1 TABLET BY MOUTH EVERY  DAY 90 tablet 1   ALPRAZolam (XANAX) 0.25 MG tablet TAKE 1 TABLET BY MOUTH AT BEDTIME AS NEEDED FOR ANXIETY 30 tablet 5   amLODipine (NORVASC) 5 MG tablet Take 1 tablet (5 mg total) by mouth daily. 90 tablet 0   aspirin EC 81 MG tablet Take 81 mg by mouth every morning.      atorvastatin (LIPITOR) 40 MG tablet Take 40 mg by mouth every morning.      BREO ELLIPTA 100-25 MCG/INH AEPB TAKE 1 PUFF BY MOUTH EVERY DAY 60 each 6   cephALEXin (KEFLEX) 500 MG capsule 1 tab by mouth Mon-Wed-Fri only 36 capsule 3   diclofenac Sodium (VOLTAREN) 1 % GEL Apply 4 g topically 4 (four) times daily. To affected joint. (Patient taking differently: Apply 4 g topically daily as needed (joint pain).) 100 g 11   ergocalciferol (VITAMIN D2) 50000 UNITS capsule Take 50,000 Units by mouth every Friday.      fluticasone (FLONASE) 50 MCG/ACT nasal spray Place 2 sprays into both nostrils daily. 16 g 1   gabapentin (NEURONTIN) 100 MG capsule TAKE 1 CAPSULE BY MOUTH THREE TIMES A DAY 90 capsule 2   hydrALAZINE (APRESOLINE) 25 MG tablet TAKE 1 TABLET (25 MG TOTAL) BY MOUTH 2 (TWO) TIMES DAILY. PLEASE KEEP UPCOMING APPT IN JANUARY 2023 WITH DR. Acie Fredrickson BEFORE ANYMORE REFILLS. THANK YOU 180 tablet 0   insulin aspart (NOVOLOG) 100 UNIT/ML injection Inject 3-8 Units into the skin 3 (three) times daily with meals. Based on a sliding scale.     insulin glargine (LANTUS) 100 UNIT/ML injection Inject 0.25 mLs (25 Units total) into the skin at bedtime. 10 mL 1   ipratropium (ATROVENT HFA) 17 MCG/ACT inhaler Inhale 2 puffs into the lungs every 4 (four) hours as needed for wheezing. 1 each 12   LACTASE PO Take 1 tablet by mouth 3 (three) times daily as needed (if meal includes cheese or other dairy).     metoprolol tartrate (LOPRESSOR) 25 MG tablet Take 25 mg by mouth 2 (two) times daily.     Multiple Vitamin (MULTIVITAMIN WITH MINERALS) TABS tablet Take 1 tablet by mouth daily.     ONE TOUCH ULTRA TEST test strip 1 each by Other route as  directed.     oxyCODONE-acetaminophen (PERCOCET/ROXICET) 5-325 MG tablet Take 1 tablet by mouth every 4 (four) hours as needed for severe pain. 30 tablet 0   OXYGEN Inhale 1.5 Lipoprotein Lipase Releasing Units into the lungs. 1.5L to 2L     Polyethyl Glycol-Propyl Glycol 0.4-0.3 % SOLN Apply 2 drops to eye daily as needed (dry eyes).      Probiotic Product (PROBIOTIC DAILY PO) Take 1 tablet by mouth daily.     RELION INSULIN SYR 0.3ML/31G 31G X 5/16" 0.3  ML MISC 1 each by Other route as needed (insulin).     No current facility-administered medications on file prior to visit.        ROS:  All others reviewed and negative.  Objective        PE:  BP 118/68 (BP Location: Left Arm, Patient Position: Sitting, Cuff Size: Large)    Pulse 67    Temp 99.8 F (37.7 C) (Oral)    Ht 5\' 4"  (1.626 m)    SpO2 94%    BMI 34.50 kg/m                 Constitutional: Pt appears in NAD               HENT: Head: NCAT.                Right Ear: External ear normal.                 Left Ear: External ear normal.                Eyes: . Pupils are equal, round, and reactive to light. Conjunctivae and EOM are normal               Nose: without d/c or deformity               Neck: Neck supple. Gross normal ROM               Cardiovascular: Normal rate and regular rhythm.                 Pulmonary/Chest: Effort normal and breath sounds without rales or wheezing.                Abd:  Soft, NT, ND, + BS, no organomegaly               Neurological: Pt is alert. At baseline orientation, motor grossly intact               Skin: Skin is warm. No rashes, no other new lesions, LE edema - none               Psychiatric: Pt behavior is normal without agitation   Micro: none  Cardiac tracings I have personally interpreted today:  none  Pertinent Radiological findings (summarize): none   Lab Results  Component Value Date   WBC 12.8 (H) 11/17/2020   HGB 11.9 (L) 11/17/2020   HCT 38.4 11/17/2020   PLT 222.0  11/17/2020   GLUCOSE 156 (H) 11/17/2020   CHOL 146 06/28/2019   TRIG 230 (A) 06/28/2019   HDL 28 (A) 06/28/2019   LDLCALC 72 06/28/2019   ALT 28 11/17/2020   AST 19 11/17/2020   NA 141 11/17/2020   K 4.0 11/17/2020   CL 104 11/17/2020   CREATININE 1.27 (H) 11/17/2020   BUN 34 (H) 11/17/2020   CO2 29 11/17/2020   TSH 0.259 (L) 11/04/2020   INR 1.01 03/04/2012   HGBA1C 7.9 (H) 11/04/2020   Assessment/Plan:  [name] is a 87 y.o. White or Caucasian [1] female with  has a past medical history of Anasarca (04/2009), Chronic diastolic CHF (congestive heart failure) ([place]), CKD (chronic kidney disease), stage III ([place]), COPD (chronic obstructive pulmonary disease) ([place]), Coronary artery disease, Diabetes mellitus, Herpes labialis, Hypercholesterolemia, Hypertension, Lumbar radiculopathy, Lumbar scoliosis, Morbid obesity ([place]), Pericardial effusion (04/2009), PONV (postoperative nausea and vomiting), Spondylolisthesis, and Spondylosis.  Dysuria Mild to mod,  cant r/o uti, for urine testing, for rocephin IM 1 gm possible recurrent pseudomonal infection, for oral antibx course,  to f/u any worsening symptoms or concerns  Hypertension, essential BP Readings from Last 3 Encounters:  11/01/21 118/68  08/09/21 132/66  06/21/21 122/80   Stable, pt to continue medical treatment norvasc, hydralazine   Diabetes with neurologic complications ([place]) Lab Results  Component Value Date   HGBA1C 7.9 (H) 11/04/2020   Mild uncontrolled, , pt to continue current medical treatment novolog lantus as declines change   Wheezing Mild, suspect bronchospastic type, for prednisone asd, and declines cxr for now  Followup: Return if symptoms worsen or fail to improve.  Cathlean Cower, MD 11/02/2021 6:21 PM [facility]

## 2021-11-02 ENCOUNTER — Other Ambulatory Visit: Payer: Self-pay | Admitting: Cardiovascular Disease

## 2021-11-02 ENCOUNTER — Encounter: Payer: Self-pay | Admitting: Internal Medicine

## 2021-11-02 DIAGNOSIS — R062 Wheezing: Secondary | ICD-10-CM | POA: Insufficient documentation

## 2021-11-02 LAB — URINE CULTURE

## 2021-11-02 LAB — URINALYSIS, ROUTINE W REFLEX MICROSCOPIC
Bilirubin Urine: NEGATIVE
Hgb urine dipstick: NEGATIVE
Ketones, ur: NEGATIVE
Leukocytes,Ua: NEGATIVE
Nitrite: NEGATIVE
RBC / HPF: NONE SEEN (ref 0–?)
Specific Gravity, Urine: 1.015 (ref 1.000–1.030)
Total Protein, Urine: NEGATIVE
Urine Glucose: NEGATIVE
Urobilinogen, UA: 0.2 (ref 0.0–1.0)
WBC, UA: NONE SEEN (ref 0–?)
pH: 5 (ref 5.0–8.0)

## 2021-11-02 NOTE — Assessment & Plan Note (Addendum)
Mild to mod, cant r/o uti, for urine testing, for rocephin IM 1 gm possible recurrent pseudomonal infection, for oral antibx course,  to f/u any worsening symptoms or concerns

## 2021-11-02 NOTE — Assessment & Plan Note (Addendum)
Mild, suspect bronchospastic type, for prednisone asd, and declines cxr for now

## 2021-11-02 NOTE — Assessment & Plan Note (Signed)
Lab Results  Component Value Date   HGBA1C 7.9 (H) 11/04/2020   Mild uncontrolled, , pt to continue current medical treatment novolog lantus as declines change

## 2021-11-02 NOTE — Assessment & Plan Note (Signed)
BP Readings from Last 3 Encounters:  11/01/21 118/68  08/09/21 132/66  06/21/21 122/80   Stable, pt to continue medical treatment norvasc, hydralazine

## 2021-11-03 ENCOUNTER — Encounter: Payer: Self-pay | Admitting: Internal Medicine

## 2021-11-11 ENCOUNTER — Encounter: Payer: Self-pay | Admitting: Cardiovascular Disease

## 2021-11-11 NOTE — Progress Notes (Signed)
Cardiology Office Note   Date:  11/12/2021   ID:  TAVI GAUGHRAN, DOB 1931/05/03, MRN [id number]  PCP:  Hoyt Koch, MD  Cardiologist: Darlin Coco MD, now [place]  Chief Complaint  Patient presents with   Congestive Heart Failure        Problem list 1. Chronic diastolic congestive heart failure 2. History of pericardial effusion-status post pericardial window 3.  Aortic stenosis 4. Diabetes mellitus 5. Coronary artery disease 6. Essential hypertension 7. Hyperlipidemia   Notes from [place] - Feb. 2017:  [name] is a 88 y.o. female who presents for a six-month follow-up visit   She has a past history of diastolic congestive heart failure. Her last echocardiogram on 02/10/12 showed an ejection fraction of 60% with normal systolic function and grade 1 diastolic dysfunction. She has a past history of pericardial effusion with pericardial tamponade and in 2010 and underwen a pericardial window with good results. she has a history of mild aortic stenosis. Since we last saw her she underwent left hip replacement by Dr. Mayer Camel and has done well.   Her last echocardiogram was 02/10/12 and showed an ejection fraction of 60%. Since last visit she has been feeling well. Since last visit her husband has been diagnosed with early Alzheimer's but fortunately he seems to be responding nicely to Aricept. Her last visit we gave her a trial off gabapentin for peripheral neuropathy. Initially she did not think that it helped but now she thinks that it has helped and she would like to try a stronger dose.  We will increase the dose to 600 mg each evening. She reports that she is only taking 80 mg of Lasix daily and this has been sufficient to keep her edema at a minimum. He has not been having any recent chest pain.  She is not having any increase in peripheral edema on the lower dose of Lasix.  Her breathing is improved.  Oct. 18, 2017: Still some dyspnea. Does not get any regular   exercise Lots of back and leg pain   Jan. 24, 2018:  Was admitted to the hospital in Dec for shortness of breath .  Was found to have Grade 2 diastolic dysfunction  Was found to have CKD - Diovan was stopped.  BP has been elevated since that time   Aug. 28, 2018:  [name] is seen today .    She does have DOE.  Does not exercise.  Has chronic diastolic CHF  February 05, 4626:  [name]  is seen today for follow-up of her chronic diastolic congestive heart failure and hypertension. Feeling well  Husband died 2 weeks ago .  [name]) ,  Had been in a SNF for 6 months  Walks with walker.  Has lost some weight .  Wt. is 201 lbs today   September 03, 2018: Clear is seen today for follow-up visit.  She has a history of chronic diastolic congestive heart failure.  She has a history of hypertension.  Overall , seems to be doing well   Jan. 20, 2021:  [name] is seen today for follow up of her chronic diastolic congestive heart failure, hypertension, hyperlipidemia. She  [name] in Nov, 2020,  Was in the hospital    Was found to have a transverse type II fracture of the odontoid process.  Was not a candidate for surgical intervention .  Still recovering from the fall.   Legs stay swollen - likely due to  her inactivity   July 20 ,2021:  [name] is seen today with son, [name].   Seems to be stable,   Has recurrent bladden infections .  typically e.coli  Her son now recognizes the subtile symptoms and she is able to get treated earlier .  Has chronic leg edema.  Has tried to elevate her legs but is not able to do this regularly   Legs are better in the am.   She uses oxygen at nght now since last hospitalization   Jan. 9, 2023 [name] is seen today with son, [name] Has chronic diastolic CHF Is on home O2 - 2 LPM Walks to the bathroom around the house Does not walk much  Has had multple falls   Just treated for URI and wheezing  Just tapered off of prednisone  Is having frequent  PVCs today on ECG  Still eating some salt - on lasix 80 mg a day  Bmp today   Past Medical History:  Diagnosis Date   Anasarca 04/2009   found to be secondary to pericardial effusion with tamponade/pericardial window done   Chronic diastolic CHF (congestive heart failure) (HCC)    CKD (chronic kidney disease), stage III ([place])    COPD (chronic obstructive pulmonary disease) ([place])    Coronary artery disease    a. mild-mod CAD 2004.   Diabetes mellitus    insulin dependent   Herpes labialis    Healing herpes labialis   Hypercholesterolemia    Hypertension    Lumbar radiculopathy    Lumbar scoliosis    Morbid obesity ([place])    Pericardial effusion 04/2009   a. 2010 with tamponade s/p window.   PONV (postoperative nausea and vomiting)    has not had any problems since 2001   Spondylolisthesis    Spondylosis     Past Surgical History:  Procedure Laterality Date   BACK SURGERY     microdiskectomy L3-4 on L/partial facetectomy 3-4 on L/removal of synovial cyst on left L3-4   BREAST BIOPSY     left   CARDIAC CATHETERIZATION  04/25/2003   L heart cath w/coronary angiography/R femoral artery/ EF 65%/no mitral regurgitation/ angiography L main coronary artery smooth & normal   EYE SURGERY     ophthalmoscopy/peritomy adjacent to the limbus from the 8 to 2:30 position superiorly   lumbar laminectomy     SUBXYPHOID PERICARDIAL WINDOW  04/2009   for pericardial effusion w/pericardial tamponade/sac drained w/20-French Baard drain/transesophageal echocardiogram confimed complete evacution of pericardial fluid   TOTAL HIP ARTHROPLASTY  03/06/2012   Procedure: TOTAL HIP ARTHROPLASTY;  Surgeon: Kerin Salen, MD;  Location: [facility];  Service: Orthopedics;  Laterality: Left;  DEPUY/PINNACLE, SUMMIT STEM, CUP POLY & CERAMIC   VAGINAL HYSTERECTOMY       Current Outpatient Medications  Medication Sig Dispense Refill   acetaminophen (TYLENOL) 500 MG tablet Take 500 mg by mouth daily as needed for  mild pain or headache.     albuterol (VENTOLIN HFA) 108 (90 Base) MCG/ACT inhaler Inhale 2 puffs into the lungs every 6 (six) hours as needed for wheezing or shortness of breath. 8 g 3   allopurinol (ZYLOPRIM) 100 MG tablet TAKE 1 TABLET BY MOUTH EVERY DAY 90 tablet 1   ALPRAZolam (XANAX) 0.25 MG tablet TAKE 1 TABLET BY MOUTH AT BEDTIME AS NEEDED FOR ANXIETY 30 tablet 5   amLODipine (NORVASC) 5 MG tablet Take 1 tablet (5 mg total) by mouth daily. 90 tablet 0   aspirin EC  81 MG tablet Take 81 mg by mouth every morning.      atorvastatin (LIPITOR) 40 MG tablet Take 40 mg by mouth every morning.      BREO ELLIPTA 100-25 MCG/INH AEPB TAKE 1 PUFF BY MOUTH EVERY DAY 60 each 6   cephALEXin (KEFLEX) 500 MG capsule 1 tab by mouth Mon-Wed-Fri only 36 capsule 3   diclofenac Sodium (VOLTAREN) 1 % GEL Apply 4 g topically 4 (four) times daily. To affected joint. (Patient taking differently: Apply 4 g topically daily as needed (joint pain).) 100 g 11   ergocalciferol (VITAMIN D2) 50000 UNITS capsule Take 50,000 Units by mouth every Friday.      fluticasone (FLONASE) 50 MCG/ACT nasal spray Place 2 sprays into both nostrils daily. 16 g 1   furosemide (LASIX) 80 MG tablet TAKE 1 TABLET (80 MG TOTAL) BY MOUTH DAILY. PLEASE KEEP UPCOMING APPT IN JANUARY 2023 WITH DR. Acie Fredrickson BEFORE ANYMORE REFILLS. THANK YOU FINAL ATTEMPT 30 tablet 1   gabapentin (NEURONTIN) 100 MG capsule TAKE 1 CAPSULE BY MOUTH THREE TIMES A DAY (Patient taking differently: 2 (two) times daily.) 90 capsule 2   hydrALAZINE (APRESOLINE) 25 MG tablet TAKE 1 TABLET (25 MG TOTAL) BY MOUTH 2 (TWO) TIMES DAILY. PLEASE KEEP UPCOMING APPT IN JANUARY 2023 WITH DR. Acie Fredrickson BEFORE ANYMORE REFILLS. THANK YOU 180 tablet 0   insulin aspart (NOVOLOG) 100 UNIT/ML injection Inject 3-10 Units into the skin 3 (three) times daily with meals. Based on a sliding scale.     insulin glargine (LANTUS) 100 UNIT/ML injection Inject 0.25 mLs (25 Units total) into the skin at  bedtime. 10 mL 1   ipratropium (ATROVENT HFA) 17 MCG/ACT inhaler Inhale 2 puffs into the lungs every 4 (four) hours as needed for wheezing. 1 each 12   LACTASE PO Take 1 tablet by mouth 3 (three) times daily as needed (if meal includes cheese or other dairy).     metoprolol tartrate (LOPRESSOR) 25 MG tablet Take 25 mg by mouth 2 (two) times daily.     Multiple Vitamin (MULTIVITAMIN WITH MINERALS) TABS tablet Take 1 tablet by mouth daily.     ONE TOUCH ULTRA TEST test strip 1 each by Other route as directed.     oxyCODONE-acetaminophen (PERCOCET/ROXICET) 5-325 MG tablet Take 1 tablet by mouth every 4 (four) hours as needed for severe pain. 30 tablet 0   OXYGEN Inhale 1.5 Lipoprotein Lipase Releasing Units into the lungs. 1.5L to 2L     Polyethyl Glycol-Propyl Glycol 0.4-0.3 % SOLN Apply 2 drops to eye daily as needed (dry eyes).      Probiotic Product (PROBIOTIC DAILY PO) Take 1 tablet by mouth daily.     RELION INSULIN SYR 0.3ML/31G 31G X 5/16" 0.3 ML MISC 1 each by Other route as needed (insulin).     predniSONE (DELTASONE) 10 MG tablet 3 tabs by mouth per day for 3 days,2tabs per day for 3 days,1tab per day for 3 days (Patient not taking: Reported on 11/12/2021) 18 tablet 0   No current facility-administered medications for this visit.    Allergies:   Atenolol, Chlorhexidine, Codeine, Lactose intolerance (gi), Lodine [etodolac], Sulfa drugs cross reactors, Sulfamethoxazole, Benzonatate, Escitalopram oxalate, Metformin, Oxycodone-acetaminophen, and Pregabalin    Social History:  The patient  reports that she has never smoked. She has never used smokeless tobacco. She reports that she does not drink alcohol and does not use drugs.   Family History:  The patient's family history includes Angina in her  mother; Coronary artery disease in an other family member; Heart attack in her father; Hypertension in her father; Stroke in her mother.    ROS:  Please see the history of present illness.    Otherwise, review of systems are positive for none.   All other systems are reviewed and negative.     Physical Exam: Blood pressure 126/60, pulse 83, height 5\' 4"  (1.626 m), weight 208 lb 3.2 oz (94.4 kg), SpO2 99 %.  GEN:  elderly female,   NAD examined in wheelchair  HEENT: Normal NECK: No JVD; No carotid bruits LYMPHATICS: No lymphadenopathy CARDIAC: RRR , no murmurs, rubs, gallops RESPIRATORY:  Clear to auscultation without rales, wheezing or rhonchi  ABDOMEN: Soft, non-tender, non-distended MUSCULOSKELETAL:  No edema; No deformity  SKIN: Warm and dry NEUROLOGIC:  Alert and oriented x 3   EKG:    November 12, 2021: Normal sinus rhythm.  First-degree AV block.  Frequent premature ventricular contractions in a bigeminal pattern.  Recent Labs: 11/17/2020: ALT 28; BUN 34; Creatinine, Ser 1.27; Hemoglobin 11.9; Platelets 222.0; Potassium 4.0; Sodium 141    Lipid Panel    Component Value Date/Time   CHOL 146 06/28/2019 0000   TRIG 230 (A) 06/28/2019 0000   HDL 28 (A) 06/28/2019 0000   CHOLHDL 3.1 04/21/2009 0620   VLDL 19 04/21/2009 0620   LDLCALC 72 06/28/2019 0000    Wt Readings from Last 3 Encounters:  11/12/21 208 lb 3.2 oz (94.4 kg)  08/09/21 201 lb (91.2 kg)  06/21/21 198 lb (89.8 kg)     ASSESSMENT AND PLAN:  1.  Chronic diastolic heart failure    -  Stable, cont lasix  Bmp today   2.  History of pericarditis:       3. diabetes mellitus with diabetic neuropathy:    4. essential hypertension:   BP is well controll controlled.  Cont meds.       Current medicines are reviewed at length with the patient today.  The patient does not have concerns regarding medicines.    Labs/ tests ordered today include:   No orders of the defined types were placed in this encounter.    Mertie Moores, MD  11/12/2021 1:29 PM    [address],  [place], [place]  [address] Pager [phone] Phone: [phone]; Fax:  [phone]

## 2021-11-12 ENCOUNTER — Other Ambulatory Visit: Payer: Self-pay

## 2021-11-12 ENCOUNTER — Encounter: Payer: Self-pay | Admitting: Cardiovascular Disease

## 2021-11-12 ENCOUNTER — Ambulatory Visit (INDEPENDENT_AMBULATORY_CARE_PROVIDER_SITE_OTHER): Payer: Medicare Other | Admitting: Cardiovascular Disease

## 2021-11-12 VITALS — BP 126/60 | HR 83 | Ht 64.0 in | Wt 208.2 lb

## 2021-11-12 DIAGNOSIS — N1832 Chronic kidney disease, stage 3b: Secondary | ICD-10-CM

## 2021-11-12 DIAGNOSIS — I10 Essential (primary) hypertension: Secondary | ICD-10-CM | POA: Diagnosis not present

## 2021-11-12 DIAGNOSIS — I5033 Acute on chronic diastolic (congestive) heart failure: Secondary | ICD-10-CM | POA: Diagnosis not present

## 2021-11-12 DIAGNOSIS — Z79899 Other long term (current) drug therapy: Secondary | ICD-10-CM | POA: Diagnosis not present

## 2021-11-12 DIAGNOSIS — I5032 Chronic diastolic (congestive) heart failure: Secondary | ICD-10-CM

## 2021-11-12 NOTE — Patient Instructions (Signed)
Medication Instructions:  Your physician recommends that you continue on your current medications as directed. Please refer to the Current Medication list given to you today.  *If you need a refill on your cardiac medications before your next appointment, please call your pharmacy*   Lab Work: BMET, LIPID, ALT, TSH If you have labs (blood work) drawn today and your tests are completely normal, you will receive your results only by: [place] (if you have MyChart) OR A paper copy in the mail If you have any lab test that is abnormal or we need to change your treatment, we will call you to review the results.   Testing/Procedures: NONE   Follow-Up: At [facility], you and your health needs are our priority.  As part of our continuing mission to provide you with exceptional heart care, we have created designated Provider Care Teams.  These Care Teams include your primary Cardiologist (physician) and Advanced Practice Providers (APPs -  Physician Assistants and Nurse Practitioners) who all work together to provide you with the care you need, when you need it.  We recommend signing up for the patient portal called "MyChart".  Sign up information is provided on this After Visit Summary.  MyChart is used to connect with patients for Virtual Visits (Telemedicine).  Patients are able to view lab/test results, encounter notes, upcoming appointments, etc.  Non-urgent messages can be sent to your provider as well.   To learn more about what you can do with MyChart, go to [email or web address].    Your next appointment:   1 year(s)  The format for your next appointment:   In Person  Provider:   Richardson Dopp, PA-C         Other Instructions

## 2021-11-13 LAB — BASIC METABOLIC PANEL
BUN/Creatinine Ratio: 27 (ref 12–28)
BUN: 44 mg/dL — ABNORMAL HIGH (ref 8–27)
CO2: 28 mmol/L (ref 20–29)
Calcium: 8.6 mg/dL — ABNORMAL LOW (ref 8.7–10.3)
Chloride: 98 mmol/L (ref 96–106)
Creatinine, Ser: 1.61 mg/dL — ABNORMAL HIGH (ref 0.57–1.00)
Glucose: 150 mg/dL — ABNORMAL HIGH (ref 70–99)
Potassium: 4.1 mmol/L (ref 3.5–5.2)
Sodium: 138 mmol/L (ref 134–144)
eGFR: 31 mL/min/{1.73_m2} — ABNORMAL LOW (ref >59)

## 2021-11-13 LAB — LIPID PANEL
Chol/HDL Ratio: 3.3 ratio (ref 0.0–4.4)
Cholesterol, Total: 156 mg/dL (ref 100–199)
HDL: 47 mg/dL (ref >39)
LDL Chol Calc (NIH): 84 mg/dL (ref 0–99)
Triglycerides: 141 mg/dL (ref 0–149)
VLDL Cholesterol Cal: 25 mg/dL (ref 5–40)

## 2021-11-13 LAB — ALT: ALT: 16 IU/L (ref 0–32)

## 2021-11-13 LAB — TSH: TSH: 1.26 u[IU]/mL (ref 0.450–4.500)

## 2021-11-21 DIAGNOSIS — E1140 Type 2 diabetes mellitus with diabetic neuropathy, unspecified: Secondary | ICD-10-CM | POA: Diagnosis not present

## 2021-11-21 DIAGNOSIS — E1165 Type 2 diabetes mellitus with hyperglycemia: Secondary | ICD-10-CM | POA: Diagnosis not present

## 2021-11-21 DIAGNOSIS — Z794 Long term (current) use of insulin: Secondary | ICD-10-CM | POA: Diagnosis not present

## 2021-11-21 DIAGNOSIS — T380X5A Adverse effect of glucocorticoids and synthetic analogues, initial encounter: Secondary | ICD-10-CM | POA: Diagnosis not present

## 2021-12-07 ENCOUNTER — Other Ambulatory Visit: Payer: Self-pay | Admitting: Cardiovascular Disease

## 2021-12-09 ENCOUNTER — Other Ambulatory Visit: Payer: Self-pay | Admitting: Cardiovascular Disease

## 2021-12-14 ENCOUNTER — Other Ambulatory Visit: Payer: Self-pay | Admitting: Internal Medicine

## 2022-01-22 ENCOUNTER — Encounter: Payer: Self-pay | Admitting: Internal Medicine

## 2022-01-22 ENCOUNTER — Other Ambulatory Visit: Payer: Self-pay | Admitting: Cardiovascular Disease

## 2022-01-22 ENCOUNTER — Other Ambulatory Visit: Payer: Self-pay

## 2022-01-22 ENCOUNTER — Ambulatory Visit (INDEPENDENT_AMBULATORY_CARE_PROVIDER_SITE_OTHER): Payer: Medicare Other | Admitting: Internal Medicine

## 2022-01-22 VITALS — BP 128/82 | HR 65 | Resp 18 | Ht 64.0 in | Wt 209.0 lb

## 2022-01-22 DIAGNOSIS — Z794 Long term (current) use of insulin: Secondary | ICD-10-CM

## 2022-01-22 DIAGNOSIS — E1142 Type 2 diabetes mellitus with diabetic polyneuropathy: Secondary | ICD-10-CM

## 2022-01-22 DIAGNOSIS — R309 Painful micturition, unspecified: Secondary | ICD-10-CM

## 2022-01-22 DIAGNOSIS — I10 Essential (primary) hypertension: Secondary | ICD-10-CM | POA: Diagnosis not present

## 2022-01-22 DIAGNOSIS — R300 Dysuria: Secondary | ICD-10-CM | POA: Diagnosis not present

## 2022-01-22 DIAGNOSIS — M159 Polyosteoarthritis, unspecified: Secondary | ICD-10-CM | POA: Diagnosis not present

## 2022-01-22 DIAGNOSIS — M1A30X0 Chronic gout due to renal impairment, unspecified site, without tophus (tophi): Secondary | ICD-10-CM | POA: Diagnosis not present

## 2022-01-22 DIAGNOSIS — J410 Simple chronic bronchitis: Secondary | ICD-10-CM

## 2022-01-22 DIAGNOSIS — N1832 Chronic kidney disease, stage 3b: Secondary | ICD-10-CM

## 2022-01-22 DIAGNOSIS — I272 Pulmonary hypertension, unspecified: Secondary | ICD-10-CM | POA: Diagnosis not present

## 2022-01-22 DIAGNOSIS — M2550 Pain in unspecified joint: Secondary | ICD-10-CM | POA: Diagnosis not present

## 2022-01-22 LAB — COMPREHENSIVE METABOLIC PANEL
ALT: 15 U/L (ref 0–35)
AST: 18 U/L (ref 0–37)
Albumin: 3.7 g/dL (ref 3.5–5.2)
Alkaline Phosphatase: 81 U/L (ref 39–117)
BUN: 38 mg/dL — ABNORMAL HIGH (ref 6–23)
CO2: 35 mEq/L — ABNORMAL HIGH (ref 19–32)
Calcium: 9.2 mg/dL (ref 8.4–10.5)
Chloride: 95 mEq/L — ABNORMAL LOW (ref 96–112)
Creatinine, Ser: 1.47 mg/dL — ABNORMAL HIGH (ref 0.40–1.20)
GFR: 31.67 mL/min — ABNORMAL LOW (ref >60.00)
Glucose, Bld: 280 mg/dL — ABNORMAL HIGH (ref 70–99)
Potassium: 4.9 mEq/L (ref 3.5–5.1)
Sodium: 135 mEq/L (ref 135–145)
Total Bilirubin: 0.4 mg/dL (ref 0.2–1.2)
Total Protein: 6.2 g/dL (ref 6.0–8.3)

## 2022-01-22 LAB — CBC
HCT: 35.3 % — ABNORMAL LOW (ref 36.0–46.0)
Hemoglobin: 11.1 g/dL — ABNORMAL LOW (ref 12.0–15.0)
MCHC: 31.3 g/dL (ref 30.0–36.0)
MCV: 87 fl (ref 78.0–100.0)
Platelets: 227 10*3/uL (ref 150.0–400.0)
RBC: 4.06 Mil/uL (ref 3.87–5.11)
RDW: 17 % — ABNORMAL HIGH (ref 11.5–15.5)
WBC: 10.1 10*3/uL (ref 4.0–10.5)

## 2022-01-22 LAB — URIC ACID: Uric Acid, Serum: 7.3 mg/dL — ABNORMAL HIGH (ref 2.4–7.0)

## 2022-01-22 MED ORDER — FLUTICASONE FUROATE-VILANTEROL 200-25 MCG/ACT IN AEPB: 1.0000 | INHALATION_SPRAY | Freq: Every day | RESPIRATORY_TRACT | 11 refills | Status: DC

## 2022-01-22 MED ORDER — NITROFURANTOIN MONOHYD MACRO 100 MG PO CAPS: ORAL_CAPSULE | ORAL | 3 refills | Status: DC

## 2022-01-22 MED ORDER — OXYCODONE-ACETAMINOPHEN 5-325 MG PO TABS: 1.0000 | ORAL_TABLET | ORAL | 0 refills | Status: DC | PRN

## 2022-01-22 MED ORDER — ERGOCALCIFEROL 1.25 MG (50000 UT) PO CAPS: 50000.0000 [IU] | ORAL_CAPSULE | ORAL | 3 refills | Status: DC

## 2022-01-22 NOTE — Progress Notes (Signed)
? ?  Subjective:  ? ?Patient ID: [name], female    DOB: 14-May-1931, 88 y.o.   MRN: [id number] ? ?HPI ?The patient is an 88 YO female coming in for concerns. ? ?Review of Systems  ?Constitutional: Negative.   ?HENT: Negative.    ?Eyes: Negative.   ?Respiratory:  Positive for shortness of breath. Negative for cough and chest tightness.   ?Cardiovascular:  Negative for chest pain, palpitations and leg swelling.  ?Gastrointestinal:  Negative for abdominal distention, abdominal pain, constipation, diarrhea, nausea and vomiting.  ?Musculoskeletal:  Positive for arthralgias and myalgias.  ?Skin: Negative.   ?Neurological: Negative.   ?Psychiatric/Behavioral: Negative.    ? ?Objective:  ?Physical Exam ?Constitutional:   ?   Appearance: She is well-developed. She is obese.  ?HENT:  ?   Head: Normocephalic and atraumatic.  ?Cardiovascular:  ?   Rate and Rhythm: Normal rate and regular rhythm.  ?Pulmonary:  ?   Effort: Pulmonary effort is normal. No respiratory distress.  ?   Breath sounds: Normal breath sounds. No wheezing or rales.  ?Abdominal:  ?   General: Bowel sounds are normal. There is no distension.  ?   Palpations: Abdomen is soft.  ?   Tenderness: There is no abdominal tenderness. There is no rebound.  ?Musculoskeletal:     ?   General: Tenderness present.  ?   Cervical back: Normal range of motion.  ?Skin: ?   General: Skin is warm and dry.  ?Neurological:  ?   Mental Status: She is alert and oriented to person, place, and time.  ?   Coordination: Coordination abnormal.  ?   Comments: wheelchair  ? ? ?Vitals:  ? 01/22/22 1438  ?BP: 128/82  ?Pulse: 65  ?Resp: 18  ?SpO2: 99%  ?Weight: 209 lb (94.8 kg)  ?Height: '5\' 4"'$  (1.626 m)  ? ? ?This visit occurred during the SARS-CoV-2 public health emergency.  Safety protocols were in place, including screening questions prior to the visit, additional usage of staff PPE, and extensive cleaning of exam room while observing appropriate contact time as indicated for disinfecting  solutions.  ? ?Assessment & Plan:  ? ?

## 2022-01-22 NOTE — Patient Instructions (Addendum)
We have sent in the refill of the oxycodone for her. ? ?We will check the labs and the urine today. ? ?We will get you in with the urogynecologist. We are changing to macrobid for preventative 3 times a week and then twice a day for 1 week to treat. ? ? ?

## 2022-01-23 LAB — URINALYSIS, ROUTINE W REFLEX MICROSCOPIC
Bilirubin Urine: NEGATIVE
Hgb urine dipstick: NEGATIVE
Ketones, ur: NEGATIVE
Nitrite: NEGATIVE
RBC / HPF: NONE SEEN (ref 0–?)
Specific Gravity, Urine: <=1.005 — AB (ref 1.000–1.030)
Total Protein, Urine: NEGATIVE
Urine Glucose: NEGATIVE
Urobilinogen, UA: 0.2 (ref 0.0–1.0)
pH: 6.5 (ref 5.0–8.0)

## 2022-01-23 LAB — MICROALBUMIN / CREATININE URINE RATIO
Creatinine,U: 23.3 mg/dL
Microalb Creat Ratio: 15.8 mg/g (ref 0.0–30.0)
Microalb, Ur: 3.7 mg/dL — ABNORMAL HIGH (ref 0.0–1.9)

## 2022-01-25 LAB — URINE CULTURE

## 2022-01-25 NOTE — Assessment & Plan Note (Signed)
Foot exam done and checking microalbumin to creatinine ratio. Checking CMP for renal function. She does see endocrine for management and overall control is improving recently.  ?

## 2022-01-25 NOTE — Assessment & Plan Note (Signed)
Oxygen needed chronically. She was able to maintain O2 sats at rest here. She was unable to move around enough to get with activity readings but they dropped significantly even upon standing. She will need to continue O2 continuous at rest and exertion.  ?

## 2022-01-25 NOTE — Assessment & Plan Note (Addendum)
Not confirmed diagnosis presumptive during 1 hospital stay. Checking uric acid level today and if <6 consider stopping allopurinol 100 mg daily which she is currently taking to help decrease pill burden. No flares recently of gout like episodes.  ?

## 2022-01-25 NOTE — Assessment & Plan Note (Signed)
Does have severe pain on occasions. Rx small amount of oxycodone/apap 5/325. [place] narcotic database reviewed. Is safer to use tylenol otc for pain control for moderate amounts of pain. ?

## 2022-01-25 NOTE — Assessment & Plan Note (Signed)
More SOB at home. We will increase breo dosing to 200/25 (from 100/25) still 1 puff daily. She uses albuterol prn also as needed.  ?

## 2022-01-25 NOTE — Assessment & Plan Note (Signed)
Will continue on gabapentin 100 mg BID for pain control and overall stable. This is adequate pain control.  ?

## 2022-01-25 NOTE — Assessment & Plan Note (Signed)
BMI 35.8 with diabetes and hypertension and other complications. Counseled about weight loss.  ?

## 2022-01-25 NOTE — Assessment & Plan Note (Addendum)
She was taking keflex for prevention and is no longer getting results from this. Will check U/A and culture today. Switch to macrobid 100 mg BID for 1 week for treatment then continue on macrobid 100 mg daily for preventative. She has been hospitalized with urosepsis in the past.  ?

## 2022-01-25 NOTE — Assessment & Plan Note (Signed)
BP at goal on lasix 80 mg daily and hydralazine 25 mg BID and amlodipine 5 mg daily and metoprolol 25 mg BID. Checking CMP and adjust as needed.  ?

## 2022-01-25 NOTE — Assessment & Plan Note (Signed)
Checking CMP for stability. BP at goal and diabetes control recently improved.  ?

## 2022-02-26 DIAGNOSIS — R351 Nocturia: Secondary | ICD-10-CM | POA: Diagnosis not present

## 2022-02-26 DIAGNOSIS — E1140 Type 2 diabetes mellitus with diabetic neuropathy, unspecified: Secondary | ICD-10-CM | POA: Diagnosis not present

## 2022-02-26 DIAGNOSIS — Z794 Long term (current) use of insulin: Secondary | ICD-10-CM | POA: Diagnosis not present

## 2022-02-26 DIAGNOSIS — I10 Essential (primary) hypertension: Secondary | ICD-10-CM | POA: Diagnosis not present

## 2022-02-26 DIAGNOSIS — E1165 Type 2 diabetes mellitus with hyperglycemia: Secondary | ICD-10-CM | POA: Diagnosis not present

## 2022-02-26 DIAGNOSIS — R631 Polydipsia: Secondary | ICD-10-CM | POA: Diagnosis not present

## 2022-02-26 DIAGNOSIS — E782 Mixed hyperlipidemia: Secondary | ICD-10-CM | POA: Diagnosis not present

## 2022-02-26 DIAGNOSIS — R3589 Other polyuria: Secondary | ICD-10-CM | POA: Diagnosis not present

## 2022-04-10 ENCOUNTER — Other Ambulatory Visit: Payer: Self-pay | Admitting: Internal Medicine

## 2022-05-06 ENCOUNTER — Other Ambulatory Visit: Payer: Self-pay | Admitting: Cardiovascular Disease

## 2022-05-13 DIAGNOSIS — I491 Atrial premature depolarization: Secondary | ICD-10-CM | POA: Diagnosis not present

## 2022-05-13 DIAGNOSIS — I440 Atrioventricular block, first degree: Secondary | ICD-10-CM | POA: Diagnosis not present

## 2022-05-13 DIAGNOSIS — I4430 Unspecified atrioventricular block: Secondary | ICD-10-CM | POA: Diagnosis not present

## 2022-05-13 DIAGNOSIS — R0902 Hypoxemia: Secondary | ICD-10-CM | POA: Diagnosis not present

## 2022-05-13 DIAGNOSIS — I959 Hypotension, unspecified: Secondary | ICD-10-CM | POA: Diagnosis not present

## 2022-05-14 ENCOUNTER — Inpatient Hospital Stay (HOSPITAL_COMMUNITY): Payer: Medicare Other

## 2022-05-14 ENCOUNTER — Inpatient Hospital Stay (HOSPITAL_COMMUNITY)
Admission: EM | Admit: 2022-05-14 | Discharge: 2022-05-21 | DRG: 291 | Disposition: A | Discharge status: Home Health | Payer: Medicare Other | Attending: Internal Medicine | Admitting: Internal Medicine

## 2022-05-14 ENCOUNTER — Encounter (HOSPITAL_COMMUNITY): Payer: Self-pay | Admitting: Internal Medicine

## 2022-05-14 ENCOUNTER — Emergency Department (HOSPITAL_COMMUNITY): Payer: Medicare Other

## 2022-05-14 DIAGNOSIS — M109 Gout, unspecified: Secondary | ICD-10-CM | POA: Diagnosis present

## 2022-05-14 DIAGNOSIS — B001 Herpesviral vesicular dermatitis: Secondary | ICD-10-CM | POA: Diagnosis present

## 2022-05-14 DIAGNOSIS — N1832 Chronic kidney disease, stage 3b: Secondary | ICD-10-CM | POA: Diagnosis not present

## 2022-05-14 DIAGNOSIS — E739 Lactose intolerance, unspecified: Secondary | ICD-10-CM | POA: Diagnosis present

## 2022-05-14 DIAGNOSIS — E1122 Type 2 diabetes mellitus with diabetic chronic kidney disease: Secondary | ICD-10-CM | POA: Diagnosis present

## 2022-05-14 DIAGNOSIS — R0902 Hypoxemia: Secondary | ICD-10-CM | POA: Diagnosis not present

## 2022-05-14 DIAGNOSIS — I5033 Acute on chronic diastolic (congestive) heart failure: Secondary | ICD-10-CM | POA: Diagnosis present

## 2022-05-14 DIAGNOSIS — J449 Chronic obstructive pulmonary disease, unspecified: Secondary | ICD-10-CM | POA: Diagnosis present

## 2022-05-14 DIAGNOSIS — I10 Essential (primary) hypertension: Secondary | ICD-10-CM | POA: Diagnosis not present

## 2022-05-14 DIAGNOSIS — I5021 Acute systolic (congestive) heart failure: Secondary | ICD-10-CM

## 2022-05-14 DIAGNOSIS — I82403 Acute embolism and thrombosis of unspecified deep veins of lower extremity, bilateral: Secondary | ICD-10-CM | POA: Diagnosis not present

## 2022-05-14 DIAGNOSIS — R0602 Shortness of breath: Secondary | ICD-10-CM | POA: Diagnosis not present

## 2022-05-14 DIAGNOSIS — J9621 Acute and chronic respiratory failure with hypoxia: Secondary | ICD-10-CM | POA: Diagnosis not present

## 2022-05-14 DIAGNOSIS — B965 Pseudomonas (aeruginosa) (mallei) (pseudomallei) as the cause of diseases classified elsewhere: Secondary | ICD-10-CM | POA: Diagnosis present

## 2022-05-14 DIAGNOSIS — M7989 Other specified soft tissue disorders: Secondary | ICD-10-CM | POA: Diagnosis not present

## 2022-05-14 DIAGNOSIS — I493 Ventricular premature depolarization: Secondary | ICD-10-CM | POA: Diagnosis present

## 2022-05-14 DIAGNOSIS — G4730 Sleep apnea, unspecified: Secondary | ICD-10-CM | POA: Diagnosis present

## 2022-05-14 DIAGNOSIS — Z9981 Dependence on supplemental oxygen: Secondary | ICD-10-CM

## 2022-05-14 DIAGNOSIS — I130 Hypertensive heart and chronic kidney disease with heart failure and stage 1 through stage 4 chronic kidney disease, or unspecified chronic kidney disease: Principal | ICD-10-CM | POA: Diagnosis present

## 2022-05-14 DIAGNOSIS — Z66 Do not resuscitate: Secondary | ICD-10-CM | POA: Diagnosis present

## 2022-05-14 DIAGNOSIS — G9341 Metabolic encephalopathy: Secondary | ICD-10-CM | POA: Diagnosis present

## 2022-05-14 DIAGNOSIS — Z882 Allergy status to sulfonamides status: Secondary | ICD-10-CM

## 2022-05-14 DIAGNOSIS — E8770 Fluid overload, unspecified: Secondary | ICD-10-CM | POA: Diagnosis present

## 2022-05-14 DIAGNOSIS — N3090 Cystitis, unspecified without hematuria: Secondary | ICD-10-CM | POA: Diagnosis not present

## 2022-05-14 DIAGNOSIS — N189 Chronic kidney disease, unspecified: Secondary | ICD-10-CM | POA: Diagnosis not present

## 2022-05-14 DIAGNOSIS — J410 Simple chronic bronchitis: Secondary | ICD-10-CM | POA: Diagnosis not present

## 2022-05-14 DIAGNOSIS — Z6836 Body mass index (BMI) 36.0-36.9, adult: Secondary | ICD-10-CM

## 2022-05-14 DIAGNOSIS — M419 Scoliosis, unspecified: Secondary | ICD-10-CM | POA: Diagnosis present

## 2022-05-14 DIAGNOSIS — R29818 Other symptoms and signs involving the nervous system: Secondary | ICD-10-CM | POA: Diagnosis present

## 2022-05-14 DIAGNOSIS — E1169 Type 2 diabetes mellitus with other specified complication: Secondary | ICD-10-CM | POA: Diagnosis present

## 2022-05-14 DIAGNOSIS — I5032 Chronic diastolic (congestive) heart failure: Secondary | ICD-10-CM | POA: Diagnosis present

## 2022-05-14 DIAGNOSIS — I959 Hypotension, unspecified: Secondary | ICD-10-CM | POA: Diagnosis not present

## 2022-05-14 DIAGNOSIS — Z794 Long term (current) use of insulin: Secondary | ICD-10-CM

## 2022-05-14 DIAGNOSIS — Z7951 Long term (current) use of inhaled steroids: Secondary | ICD-10-CM

## 2022-05-14 DIAGNOSIS — Z885 Allergy status to narcotic agent status: Secondary | ICD-10-CM

## 2022-05-14 DIAGNOSIS — Z888 Allergy status to other drugs, medicaments and biological substances status: Secondary | ICD-10-CM

## 2022-05-14 DIAGNOSIS — M19072 Primary osteoarthritis, left ankle and foot: Secondary | ICD-10-CM | POA: Diagnosis not present

## 2022-05-14 DIAGNOSIS — N390 Urinary tract infection, site not specified: Secondary | ICD-10-CM

## 2022-05-14 DIAGNOSIS — E6609 Other obesity due to excess calories: Secondary | ICD-10-CM

## 2022-05-14 DIAGNOSIS — I2510 Atherosclerotic heart disease of native coronary artery without angina pectoris: Secondary | ICD-10-CM | POA: Diagnosis present

## 2022-05-14 DIAGNOSIS — Z20822 Contact with and (suspected) exposure to covid-19: Secondary | ICD-10-CM | POA: Diagnosis present

## 2022-05-14 DIAGNOSIS — E871 Hypo-osmolality and hyponatremia: Secondary | ICD-10-CM

## 2022-05-14 DIAGNOSIS — M79672 Pain in left foot: Secondary | ICD-10-CM | POA: Diagnosis present

## 2022-05-14 DIAGNOSIS — Z7982 Long term (current) use of aspirin: Secondary | ICD-10-CM

## 2022-05-14 DIAGNOSIS — J9622 Acute and chronic respiratory failure with hypercapnia: Secondary | ICD-10-CM | POA: Diagnosis present

## 2022-05-14 DIAGNOSIS — J811 Chronic pulmonary edema: Secondary | ICD-10-CM | POA: Diagnosis not present

## 2022-05-14 DIAGNOSIS — N179 Acute kidney failure, unspecified: Secondary | ICD-10-CM | POA: Diagnosis present

## 2022-05-14 DIAGNOSIS — Z8249 Family history of ischemic heart disease and other diseases of the circulatory system: Secondary | ICD-10-CM

## 2022-05-14 DIAGNOSIS — D649 Anemia, unspecified: Secondary | ICD-10-CM

## 2022-05-14 DIAGNOSIS — E782 Mixed hyperlipidemia: Secondary | ICD-10-CM | POA: Diagnosis present

## 2022-05-14 DIAGNOSIS — R531 Weakness: Secondary | ICD-10-CM | POA: Diagnosis not present

## 2022-05-14 DIAGNOSIS — E1149 Type 2 diabetes mellitus with other diabetic neurological complication: Secondary | ICD-10-CM | POA: Diagnosis present

## 2022-05-14 DIAGNOSIS — Z79899 Other long term (current) drug therapy: Secondary | ICD-10-CM

## 2022-05-14 DIAGNOSIS — D631 Anemia in chronic kidney disease: Secondary | ICD-10-CM | POA: Diagnosis not present

## 2022-05-14 DIAGNOSIS — Z96642 Presence of left artificial hip joint: Secondary | ICD-10-CM | POA: Diagnosis present

## 2022-05-14 DIAGNOSIS — Z883 Allergy status to other anti-infective agents status: Secondary | ICD-10-CM

## 2022-05-14 DIAGNOSIS — M7732 Calcaneal spur, left foot: Secondary | ICD-10-CM | POA: Diagnosis not present

## 2022-05-14 DIAGNOSIS — R609 Edema, unspecified: Secondary | ICD-10-CM | POA: Diagnosis not present

## 2022-05-14 LAB — HEPATIC FUNCTION PANEL
ALT: 17 U/L (ref 0–44)
AST: 21 U/L (ref 15–41)
Albumin: 3.5 g/dL (ref 3.5–5.0)
Alkaline Phosphatase: 80 U/L (ref 38–126)
Bilirubin, Direct: <0.1 mg/dL (ref 0.0–0.2)
Total Bilirubin: 0.6 mg/dL (ref 0.3–1.2)
Total Protein: 6.4 g/dL — ABNORMAL LOW (ref 6.5–8.1)

## 2022-05-14 LAB — I-STAT VENOUS BLOOD GAS, ED
Acid-Base Excess: 7 mmol/L — ABNORMAL HIGH (ref 0.0–2.0)
Acid-Base Excess: 6 mmol/L — ABNORMAL HIGH (ref 0.0–2.0)
Bicarbonate: 36.9 mmol/L — ABNORMAL HIGH (ref 20.0–28.0)
Bicarbonate: 34.3 mmol/L — ABNORMAL HIGH (ref 20.0–28.0)
Calcium, Ion: 1.17 mmol/L (ref 1.15–1.40)
Calcium, Ion: 1.14 mmol/L — ABNORMAL LOW (ref 1.15–1.40)
HCT: 36 % (ref 36.0–46.0)
HCT: 32 % — ABNORMAL LOW (ref 36.0–46.0)
Hemoglobin: 12.2 g/dL (ref 12.0–15.0)
Hemoglobin: 10.9 g/dL — ABNORMAL LOW (ref 12.0–15.0)
O2 Saturation: 99 %
O2 Saturation: 73 %
Potassium: 5.7 mmol/L — ABNORMAL HIGH (ref 3.5–5.1)
Potassium: 5.3 mmol/L — ABNORMAL HIGH (ref 3.5–5.1)
Sodium: 130 mmol/L — ABNORMAL LOW (ref 135–145)
Sodium: 131 mmol/L — ABNORMAL LOW (ref 135–145)
TCO2: 40 mmol/L — ABNORMAL HIGH (ref 22–32)
TCO2: 36 mmol/L — ABNORMAL HIGH (ref 22–32)
pCO2, Ven: 86.9 mmHg (ref 44–60)
pCO2, Ven: 72.9 mmHg (ref 44–60)
pH, Ven: 7.236 — ABNORMAL LOW (ref 7.25–7.43)
pH, Ven: 7.28 (ref 7.25–7.43)
pO2, Ven: 172 mmHg — ABNORMAL HIGH (ref 32–45)
pO2, Ven: 45 mmHg (ref 32–45)

## 2022-05-14 LAB — CBC WITH DIFFERENTIAL/PLATELET
Abs Immature Granulocytes: 0.25 10*3/uL — ABNORMAL HIGH (ref 0.00–0.07)
Basophils Absolute: 0 10*3/uL (ref 0.0–0.1)
Basophils Relative: 0 %
Eosinophils Absolute: 0.1 10*3/uL (ref 0.0–0.5)
Eosinophils Relative: 1 %
HCT: 36.5 % (ref 36.0–46.0)
Hemoglobin: 10.8 g/dL — ABNORMAL LOW (ref 12.0–15.0)
Immature Granulocytes: 2 %
Lymphocytes Relative: 9 %
Lymphs Abs: 1.2 10*3/uL (ref 0.7–4.0)
MCH: 27.6 pg (ref 26.0–34.0)
MCHC: 29.6 g/dL — ABNORMAL LOW (ref 30.0–36.0)
MCV: 93.1 fL (ref 80.0–100.0)
Monocytes Absolute: 0.7 10*3/uL (ref 0.1–1.0)
Monocytes Relative: 5 %
Neutro Abs: 10.8 10*3/uL — ABNORMAL HIGH (ref 1.7–7.7)
Neutrophils Relative %: 83 %
Platelets: 241 10*3/uL (ref 150–400)
RBC: 3.92 MIL/uL (ref 3.87–5.11)
RDW: 15.8 % — ABNORMAL HIGH (ref 11.5–15.5)
WBC: 13.1 10*3/uL — ABNORMAL HIGH (ref 4.0–10.5)
nRBC: 0.2 % (ref 0.0–0.2)

## 2022-05-14 LAB — URINALYSIS, ROUTINE W REFLEX MICROSCOPIC
Bilirubin Urine: NEGATIVE
Glucose, UA: NEGATIVE mg/dL
Hgb urine dipstick: NEGATIVE
Ketones, ur: NEGATIVE mg/dL
Leukocytes,Ua: NEGATIVE
Nitrite: NEGATIVE
Protein, ur: NEGATIVE mg/dL
Specific Gravity, Urine: 1.009 (ref 1.005–1.030)
pH: 5 (ref 5.0–8.0)

## 2022-05-14 LAB — RESP PANEL BY RT-PCR (FLU A&B, COVID) ARPGX2
Influenza A by PCR: NEGATIVE
Influenza B by PCR: NEGATIVE
SARS Coronavirus 2 by RT PCR: NEGATIVE

## 2022-05-14 LAB — LACTIC ACID, PLASMA
Lactic Acid, Venous: 0.7 mmol/L (ref 0.5–1.9)
Lactic Acid, Venous: 0.7 mmol/L (ref 0.5–1.9)

## 2022-05-14 LAB — BASIC METABOLIC PANEL
Anion gap: 16 — ABNORMAL HIGH (ref 5–15)
BUN: 46 mg/dL — ABNORMAL HIGH (ref 8–23)
CO2: 24 mmol/L (ref 22–32)
Calcium: 8.9 mg/dL (ref 8.9–10.3)
Chloride: 94 mmol/L — ABNORMAL LOW (ref 98–111)
Creatinine, Ser: 1.91 mg/dL — ABNORMAL HIGH (ref 0.44–1.00)
GFR, Estimated: 25 mL/min — ABNORMAL LOW (ref >60)
Glucose, Bld: 156 mg/dL — ABNORMAL HIGH (ref 70–99)
Potassium: 5.9 mmol/L — ABNORMAL HIGH (ref 3.5–5.1)
Sodium: 134 mmol/L — ABNORMAL LOW (ref 135–145)

## 2022-05-14 LAB — ECHOCARDIOGRAM COMPLETE
Area-P 1/2: 3.42 cm2
Height: 64 in
S' Lateral: 2.8 cm

## 2022-05-14 LAB — GLUCOSE, CAPILLARY: Glucose-Capillary: 134 mg/dL — ABNORMAL HIGH (ref 70–99)

## 2022-05-14 LAB — TROPONIN I (HIGH SENSITIVITY)
Troponin I (High Sensitivity): 12 ng/L (ref <18)
Troponin I (High Sensitivity): 17 ng/L (ref <18)

## 2022-05-14 LAB — LIPASE, BLOOD: Lipase: 34 U/L (ref 11–51)

## 2022-05-14 LAB — BRAIN NATRIURETIC PEPTIDE: B Natriuretic Peptide: 579.2 pg/mL — ABNORMAL HIGH (ref 0.0–100.0)

## 2022-05-14 MED ORDER — ALBUTEROL SULFATE (2.5 MG/3ML) 0.083% IN NEBU: 2.5000 mg | INHALATION_SOLUTION | Freq: Four times a day (QID) | RESPIRATORY_TRACT | Status: DC | PRN

## 2022-05-14 MED ORDER — PERFLUTREN LIPID MICROSPHERE
1.0000 mL | INTRAVENOUS | Status: AC | PRN
  Administered 2022-05-14: 2 mL via INTRAVENOUS

## 2022-05-14 MED ORDER — BISACODYL 5 MG PO TBEC: 5.0000 mg | DELAYED_RELEASE_TABLET | Freq: Every day | ORAL | Status: DC | PRN

## 2022-05-14 MED ORDER — SODIUM ZIRCONIUM CYCLOSILICATE 10 G PO PACK
10.0000 g | PACK | Freq: Once | ORAL | Status: AC
Start: 2022-05-14 — End: 2022-05-14
  Administered 2022-05-14: 10 g via ORAL
  Filled 2022-05-14: qty 1

## 2022-05-14 MED ORDER — ALBUTEROL SULFATE (2.5 MG/3ML) 0.083% IN NEBU
5.0000 mg | INHALATION_SOLUTION | Freq: Once | RESPIRATORY_TRACT | Status: AC
  Administered 2022-05-14: 5 mg via RESPIRATORY_TRACT
  Filled 2022-05-14: qty 6

## 2022-05-14 MED ORDER — FLUTICASONE PROPIONATE 50 MCG/ACT NA SUSP
2.0000 | Freq: Every day | NASAL | Status: DC
Start: 2022-05-15 — End: 2022-05-21
  Administered 2022-05-15 – 2022-05-21 (×7): 2 via NASAL
  Filled 2022-05-14: qty 16

## 2022-05-14 MED ORDER — ONDANSETRON HCL 4 MG PO TABS: 4.0000 mg | ORAL_TABLET | Freq: Four times a day (QID) | ORAL | Status: DC | PRN

## 2022-05-14 MED ORDER — SODIUM CHLORIDE 0.9% FLUSH
3.0000 mL | Freq: Two times a day (BID) | INTRAVENOUS | Status: DC
  Administered 2022-05-14 – 2022-05-21 (×13): 3 mL via INTRAVENOUS

## 2022-05-14 MED ORDER — ONDANSETRON HCL 4 MG/2ML IJ SOLN: 4.0000 mg | Freq: Four times a day (QID) | INTRAMUSCULAR | Status: DC | PRN

## 2022-05-14 MED ORDER — ASPIRIN 81 MG PO TBEC
81.0000 mg | DELAYED_RELEASE_TABLET | Freq: Every morning | ORAL | Status: DC
  Administered 2022-05-15 – 2022-05-21 (×7): 81 mg via ORAL
  Filled 2022-05-14 (×7): qty 1

## 2022-05-14 MED ORDER — FUROSEMIDE 10 MG/ML IJ SOLN
60.0000 mg | Freq: Once | INTRAMUSCULAR | Status: AC
  Administered 2022-05-14: 60 mg via INTRAVENOUS
  Filled 2022-05-14: qty 6

## 2022-05-14 MED ORDER — POLYETHYLENE GLYCOL 3350 17 G PO PACK: 17.0000 g | PACK | Freq: Every day | ORAL | Status: DC | PRN

## 2022-05-14 MED ORDER — ENOXAPARIN SODIUM 30 MG/0.3ML IJ SOSY
30.0000 mg | PREFILLED_SYRINGE | INTRAMUSCULAR | Status: DC
  Administered 2022-05-15 – 2022-05-21 (×7): 30 mg via SUBCUTANEOUS
  Filled 2022-05-14 (×6): qty 0.3

## 2022-05-14 MED ORDER — ALLOPURINOL 100 MG PO TABS
100.0000 mg | ORAL_TABLET | Freq: Every day | ORAL | Status: DC
Start: 2022-05-14 — End: 2022-05-21
  Administered 2022-05-15 – 2022-05-21 (×7): 100 mg via ORAL
  Filled 2022-05-14 (×7): qty 1

## 2022-05-14 MED ORDER — ATORVASTATIN CALCIUM 40 MG PO TABS
40.0000 mg | ORAL_TABLET | Freq: Every morning | ORAL | Status: DC
  Administered 2022-05-15 – 2022-05-21 (×7): 40 mg via ORAL
  Filled 2022-05-14 (×7): qty 1

## 2022-05-14 MED ORDER — INSULIN GLARGINE-YFGN 100 UNIT/ML ~~LOC~~ SOLN: 25.0000 [IU] | Freq: Every day | SUBCUTANEOUS | Status: DC

## 2022-05-14 MED ORDER — TRAZODONE HCL 50 MG PO TABS: 25.0000 mg | ORAL_TABLET | Freq: Every evening | ORAL | Status: DC | PRN

## 2022-05-14 MED ORDER — ALPRAZOLAM 0.5 MG PO TABS
0.2500 mg | ORAL_TABLET | Freq: Every evening | ORAL | Status: DC | PRN
  Administered 2022-05-15 – 2022-05-21 (×6): 0.25 mg via ORAL
  Filled 2022-05-14 (×6): qty 1

## 2022-05-14 MED ORDER — DOCUSATE SODIUM 100 MG PO CAPS
100.0000 mg | ORAL_CAPSULE | Freq: Two times a day (BID) | ORAL | Status: DC
  Administered 2022-05-15 – 2022-05-21 (×11): 100 mg via ORAL
  Filled 2022-05-14 (×12): qty 1

## 2022-05-14 MED ORDER — INSULIN GLARGINE-YFGN 100 UNIT/ML ~~LOC~~ SOLN
15.0000 [IU] | Freq: Every day | SUBCUTANEOUS | Status: DC
  Administered 2022-05-14 – 2022-05-15 (×2): 15 [IU] via SUBCUTANEOUS
  Filled 2022-05-14 (×3): qty 0.15

## 2022-05-14 MED ORDER — IPRATROPIUM-ALBUTEROL 0.5-2.5 (3) MG/3ML IN SOLN
3.0000 mL | Freq: Four times a day (QID) | RESPIRATORY_TRACT | Status: DC
  Administered 2022-05-14 – 2022-05-15 (×4): 3 mL via RESPIRATORY_TRACT
  Filled 2022-05-14 (×3): qty 3

## 2022-05-14 MED ORDER — HYDRALAZINE HCL 20 MG/ML IJ SOLN: 5.0000 mg | INTRAMUSCULAR | Status: DC | PRN

## 2022-05-14 MED ORDER — POLYETHYL GLYCOL-PROPYL GLYCOL 0.4-0.3 % OP SOLN: 2.0000 [drp] | Freq: Every day | OPHTHALMIC | Status: DC | PRN

## 2022-05-14 MED ORDER — FUROSEMIDE 10 MG/ML IJ SOLN
40.0000 mg | Freq: Two times a day (BID) | INTRAMUSCULAR | Status: DC
  Administered 2022-05-14 – 2022-05-17 (×6): 40 mg via INTRAVENOUS
  Filled 2022-05-14 (×6): qty 4

## 2022-05-14 MED ORDER — INSULIN ASPART 100 UNIT/ML IJ SOLN
0.0000 [IU] | Freq: Three times a day (TID) | INTRAMUSCULAR | Status: DC
  Administered 2022-05-15: 2 [IU] via SUBCUTANEOUS
  Administered 2022-05-15: 1 [IU] via SUBCUTANEOUS
  Administered 2022-05-16 (×2): 3 [IU] via SUBCUTANEOUS
  Administered 2022-05-17 (×2): 2 [IU] via SUBCUTANEOUS
  Administered 2022-05-17: 5 [IU] via SUBCUTANEOUS
  Administered 2022-05-18: 2 [IU] via SUBCUTANEOUS
  Administered 2022-05-18 (×2): 3 [IU] via SUBCUTANEOUS

## 2022-05-14 MED ORDER — INSULIN ASPART 100 UNIT/ML IJ SOLN
0.0000 [IU] | Freq: Every day | INTRAMUSCULAR | Status: DC
  Administered 2022-05-15: 2 [IU] via SUBCUTANEOUS
  Administered 2022-05-16 – 2022-05-17 (×2): 3 [IU] via SUBCUTANEOUS

## 2022-05-14 MED ORDER — ACETAMINOPHEN 325 MG PO TABS
650.0000 mg | ORAL_TABLET | Freq: Four times a day (QID) | ORAL | Status: DC | PRN
  Administered 2022-05-20: 650 mg via ORAL
  Filled 2022-05-14: qty 2

## 2022-05-14 MED ORDER — GABAPENTIN 100 MG PO CAPS
100.0000 mg | ORAL_CAPSULE | Freq: Two times a day (BID) | ORAL | Status: DC
  Administered 2022-05-14 – 2022-05-21 (×14): 100 mg via ORAL
  Filled 2022-05-14 (×14): qty 1

## 2022-05-14 MED ORDER — FLUTICASONE FUROATE-VILANTEROL 200-25 MCG/ACT IN AEPB
1.0000 | INHALATION_SPRAY | Freq: Every day | RESPIRATORY_TRACT | Status: DC
  Administered 2022-05-14 – 2022-05-21 (×8): 1 via RESPIRATORY_TRACT
  Filled 2022-05-14: qty 28

## 2022-05-14 MED ORDER — ACETAMINOPHEN 650 MG RE SUPP: 650.0000 mg | Freq: Four times a day (QID) | RECTAL | Status: DC | PRN

## 2022-05-14 NOTE — Progress Notes (Signed)
Placed pt. On High flow nasal cannula at 10 liters. Pt. Tolerating well at this time.

## 2022-05-14 NOTE — Progress Notes (Signed)
Pt placed on bipap by RT. Pt tolerating well at this time,RN aware, MD aware, RT will monitor.

## 2022-05-14 NOTE — ED Notes (Signed)
Lab results was given to Nurse.

## 2022-05-14 NOTE — H&P (Signed)
History and Physical    Patient: [name] IWL:[id number] DOB: 1931/07/03 DOA: 05/14/2022 DOS: the patient was seen and examined on 05/14/2022 PCP: Hoyt Koch, MD  Patient coming from: Home - lives with son; NOK: Jaylnn, [place], [phone]   Chief Complaint: SOB  HPI: [name] is a 88 y.o. female with medical history significant of anasarca; stage 3 CKD; chronic diastolic CHF; COPD; CAD; DM; HLD; HTN; and morbid obesity presenting with hypoxia.  She was 65% on her home 3L [place] O2.  Her son reports that they have not been able to get enough O2.  They have been trying to get a new machine.  O2 as low as 50s, as high as 70s.  They started on the big tank with 4L and then called to have her transported.  She has a bladder infection, on Macrobid and this is the 3rd day.  She was confused with the UTI and it got worse with the low O2.  She was getting out of her bed and slid to the floor; she was unable to assist with standing.  No change in cough, sounds like reflux vs. Fluid in her lungs.  Something like vomiting, just liquid and not much.  He is concerned that she could have aspirated at some point.  No fever.  They self-treat with antibiotics, does prophylaxis 3x/week.  She developed symptoms and increased Macrobid to BID x 7 days.  He doesn't think she is having urinary symptoms.  She has not voided in the ER.      ER Course:  COPD/CHF exacerbation, on BIPAP.  Symptoms since Friday.  Blood gas improving with BIPAP.  K+ high, given Lasix, albuterol, Lokelma and PVCs have cleared up.  Her son thinks this is triggered by UTI, currently on Macrobid.       Review of Systems: unable to review all systems due to the inability of the patient to answer questions. Past Medical History:  Diagnosis Date   Anasarca 04/2009   found to be secondary to pericardial effusion with tamponade/pericardial window done   Chronic diastolic CHF (congestive heart failure) (HCC)    CKD (chronic  kidney disease), stage III (HCC)    COPD (chronic obstructive pulmonary disease) ([place])    Coronary artery disease    a. mild-mod CAD 2004.   Diabetes mellitus    insulin dependent   Herpes labialis    Healing herpes labialis   Hypercholesterolemia    Hypertension    Lumbar radiculopathy    Lumbar scoliosis    Morbid obesity ([place])    Pericardial effusion 04/2009   a. 2010 with tamponade s/p window.   PONV (postoperative nausea and vomiting)    has not had any problems since 2001   Spondylolisthesis    Spondylosis    Past Surgical History:  Procedure Laterality Date   BACK SURGERY     microdiskectomy L3-4 on L/partial facetectomy 3-4 on L/removal of synovial cyst on left L3-4   BREAST BIOPSY     left   CARDIAC CATHETERIZATION  04/25/2003   L heart cath w/coronary angiography/R femoral artery/ EF 65%/no mitral regurgitation/ angiography L main coronary artery smooth & normal   EYE SURGERY     ophthalmoscopy/peritomy adjacent to the limbus from the 8 to 2:30 position superiorly   lumbar laminectomy     SUBXYPHOID PERICARDIAL WINDOW  04/2009   for pericardial effusion w/pericardial tamponade/sac drained w/20-French Baard drain/transesophageal echocardiogram confimed complete evacution of pericardial fluid  TOTAL HIP ARTHROPLASTY  03/06/2012   Procedure: TOTAL HIP ARTHROPLASTY;  Surgeon: Kerin Salen, MD;  Location: [facility];  Service: Orthopedics;  Laterality: Left;  DEPUY/PINNACLE, SUMMIT STEM, CUP POLY & CERAMIC   VAGINAL HYSTERECTOMY     Social History:  reports that she has never smoked. She has never used smokeless tobacco. She reports that she does not drink alcohol and does not use drugs.  Allergies  Allergen Reactions   Atenolol Other (See Comments)    weakness   Chlorhexidine Other (See Comments)    Unknown reaction   Codeine Nausea And Vomiting    Hydrocodone is ok   Lactose Intolerance (Gi) Diarrhea   Lodine [Etodolac] Other (See Comments)    dizziness   Sulfa  Drugs Cross Reactors Nausea Only   Sulfamethoxazole Nausea And Vomiting   Benzonatate Nausea And Vomiting   Escitalopram Oxalate Other (See Comments)    mild hallucinations   Metformin Nausea And Vomiting    Reaction to IR and XL   Oxycodone-Acetaminophen Other (See Comments)    Percocet does not relieve pts' pain   Pregabalin Other (See Comments)    Unknown reaction    Family History  Problem Relation Age of Onset   Heart attack Father    Hypertension Father    Stroke Mother    Angina Mother    Coronary artery disease Other     Prior to Admission medications   Medication Sig Start Date End Date Taking? Authorizing Provider  acetaminophen (TYLENOL) 500 MG tablet Take 500 mg by mouth daily as needed for mild pain or headache. Patient not taking: Reported on 01/22/2022    [provider]  albuterol (VENTOLIN HFA) 108 (90 Base) MCG/ACT inhaler Inhale 2 puffs into the lungs every 6 (six) hours as needed for wheezing or shortness of breath. 11/01/21   Biagio Borg, MD  allopurinol (ZYLOPRIM) 100 MG tablet TAKE 1 TABLET BY MOUTH EVERY DAY 12/14/21   Hoyt Koch, MD  ALPRAZolam Duanne Moron) 0.25 MG tablet TAKE 1 TABLET BY MOUTH AT BEDTIME AS NEEDED FOR ANXIETY 04/12/22   Hoyt Koch, MD  amLODipine (NORVASC) 5 MG tablet TAKE 1 TABLET (5 MG TOTAL) BY MOUTH DAILY. 12/07/21   Nahser, Wonda Cheng, MD  aspirin EC 81 MG tablet Take 81 mg by mouth every morning.     [provider]  atorvastatin (LIPITOR) 40 MG tablet Take 40 mg by mouth every morning.  12/31/18   [provider]  cephALEXin (KEFLEX) 500 MG capsule 1 tab by mouth Mon-Wed-Fri only 08/09/21   Biagio Borg, MD  diclofenac Sodium (VOLTAREN) 1 % GEL Apply 4 g topically 4 (four) times daily. To affected joint. Patient taking differently: Apply 4 g topically daily as needed (joint pain). 06/27/20   Gregor Hams, MD  ergocalciferol (VITAMIN D2) 1.25 MG (50000 UT) capsule Take 1 capsule (50,000 Units  total) by mouth every Friday. 01/25/22   Hoyt Koch, MD  fluticasone Asencion Islam) 50 MCG/ACT nasal spray Place 2 sprays into both nostrils daily. 10/15/16   Orson Eva, MD  fluticasone furoate-vilanterol (BREO ELLIPTA) 200-25 MCG/ACT AEPB Inhale 1 puff into the lungs daily. 01/22/22   Hoyt Koch, MD  furosemide (LASIX) 80 MG tablet TAKE 1 TABLET (80 MG TOTAL) BY MOUTH DAILY. PLEASE KEEP UPCOMING APPT IN JANUARY 2023 WITH DR. Acie Fredrickson BEFORE ANYMORE REFILLS. THANK YOU FINAL ATTEMPT 12/11/21   Nahser, Wonda Cheng, MD  gabapentin (NEURONTIN) 100 MG capsule Take 1 capsule (  100 mg total) by mouth 2 (two) times daily. 12/14/21   Hoyt Koch, MD  hydrALAZINE (APRESOLINE) 25 MG tablet Take 1 tablet (25 mg total) by mouth 2 (two) times daily. 01/23/22   Nahser, Wonda Cheng, MD  insulin aspart (NOVOLOG) 100 UNIT/ML injection Inject 12 Units into the skin 3 (three) times daily with meals. Based on a sliding scale.    [provider]  insulin glargine (LANTUS) 100 UNIT/ML injection Inject 0.25 mLs (25 Units total) into the skin at bedtime. Patient taking differently: Inject 30 Units into the skin at bedtime. 12/24/19   Harold Hedge, MD  ipratropium (ATROVENT HFA) 17 MCG/ACT inhaler Inhale 2 puffs into the lungs every 4 (four) hours as needed for wheezing. 11/17/20   Hoyt Koch, MD  LACTASE PO Take 1 tablet by mouth 3 (three) times daily as needed (if meal includes cheese or other dairy).    [provider]  metoprolol tartrate (LOPRESSOR) 25 MG tablet Take 25 mg by mouth 2 (two) times daily.    [provider]  Multiple Vitamin (MULTIVITAMIN WITH MINERALS) TABS tablet Take 1 tablet by mouth daily.    [provider]  nitrofurantoin, macrocrystal-monohydrate, (MACROBID) 100 MG capsule 3 times a week, use to treat 1 pill twice a day for 1 week. 01/22/22   Hoyt Koch, MD  ONE TOUCH ULTRA TEST test strip 1 each by Other route as directed. 01/13/19    [provider]  oxyCODONE-acetaminophen (PERCOCET/ROXICET) 5-325 MG tablet Take 1 tablet by mouth every 4 (four) hours as needed for severe pain. 01/22/22   Hoyt Koch, MD  OXYGEN Inhale 1.5 Lipoprotein Lipase Releasing Units into the lungs. 1.5L to 2L    [provider]  Polyethyl Glycol-Propyl Glycol 0.4-0.3 % SOLN Apply 2 drops to eye daily as needed (dry eyes).     [provider]  predniSONE (DELTASONE) 10 MG tablet 3 tabs by mouth per day for 3 days,2tabs per day for 3 days,1tab per day for 3 days Patient not taking: Reported on 11/12/2021 11/01/21   Biagio Borg, MD  Probiotic Product (PROBIOTIC DAILY PO) Take 1 tablet by mouth daily.    [provider]  RELION INSULIN SYR 0.3ML/31G 31G X 5/16" 0.3 ML MISC 1 each by Other route as needed (insulin). Patient not taking: Reported on 01/22/2022 11/23/19   [provider]    Physical Exam: Vitals:   05/14/22 1345 05/14/22 1400 05/14/22 1415 05/14/22 1430  BP: 126/72 138/64 134/66 140/64  Pulse: 64 76 73 77  Resp: (!) '23 20 17 '$ (!) 25  Temp:      TempSrc:      SpO2: 98% 99% 98% 99%  Height:       General:  Appears calm and comfortable and is in NAD, on BIPAP at the time of my visit but it was taken off without difficulty shortly thereafter Eyes:   EOMI, normal lids, iris ENT:  grossly normal hearing, lips & tongue; BIPAP in place Neck:  no LAD, masses or thyromegaly Cardiovascular:  RRR, no m/r/g. 1-2+ LE edema.  Respiratory:   CTA bilaterally with no wheezes/rales/rhonchi.  Normal respiratory effort on BIPAP. Abdomen:  soft, NT, ND Skin:  no rash or induration seen on limited exam Musculoskeletal:  grossly normal tone BUE/BLE, good ROM, no bony abnormality Psychiatric:  blunted mood and affect, speech limited by BIPAP, mildly confused Neurologic:  unable to effectively perform   Radiological Exams on Admission: Independently  reviewed - see discussion in A/P where  applicable  DG Chest 1 View  Result Date: 05/14/2022 CLINICAL DATA:  88 year old female with shortness of breath. EXAM: CHEST  1 VIEW COMPARISON:  Portable chest 11/03/2020 and earlier. FINDINGS: AP view at 0512 hours. Slightly lower lung volumes. Stable cardiac size at the upper limits of normal. Other mediastinal contours are within normal limits. Diffuse pulmonary vascular congestion with indistinct appearance of the vasculature, Kerley B lines in the right lung. Trace right minor fissure pleural fluid. No pneumothorax or consolidation. No layering pleural effusion. No acute osseous abnormality identified. Negative visible bowel gas. IMPRESSION: Acute Pulmonary Edema with trace pleural fluid. Electronically Signed   By: Genevie Ann M.D.   On: 05/14/2022 05:59    EKG: Independently reviewed.  NSR with rate 77; PVCs; IVCD; nonspecific ST changes with no evidence of acute ischemia   Labs on Admission: I have personally reviewed the available labs and imaging studies at the time of the admission.  Pertinent labs:    VBG: 7.236/86.9/36.9 -> 7.280/72.9/34.3 K+ 5.9 Glucose 156 BUN 46/Creatinine 1.91/GFR 25; 38/1.57/32 on 01/22/22 BNP 579.2 HS troponin 12, 17 Lactate 0.7 WBC 13.1 Hgb 10.8 - stable COVID/flu negative UA WNL   Assessment and Plan: Principal Problem:   Acute on chronic respiratory failure with hypoxia and hypercapnia (HCC) Active Problems:   Hypertension, essential   Mixed dyslipidemia   Diabetes with neurologic complications (HCC)   COPD (chronic obstructive pulmonary disease) (HCC)   Acute on chronic diastolic CHF (congestive heart failure) (HCC)   Acute kidney injury superimposed on chronic kidney disease (HCC)   Acute metabolic encephalopathy   Class 2 obesity due to excess calories with body mass index (BMI) of 35.0 to 35.9 in adult   DNR (do not resuscitate)    Hypercarbic/hypoxic respiratory failure associated with acute non chronic diastolic CHF -Patient with  known h/o chronic diastolic CHF and COPD presenting with AMS -pCO2 elevated at 87 on VBG -CXR consistent with pulmonary edema -Elevated BNP  -With elevated BNP and abnl CXR, acute decompensated CHF seems probable as diagnosis -Will admit to progressive care, as per the Emergency HF Mortality Risk Grade.  The patient has: pulmonary edema requiring new O2 therapy; AKI on CKD with >25% decrease in GFR). -Will request echocardiogram; prior was in 10/2020 with preserved EF and grade 1 diastolic dysfunction -Will continue ASA -ACE/ARB, beta blocker, and spironolactone are recommended as per guideline-directed medical therapy to reduce morbidity/mortality; consider addition of spironolactone when more clinically stable -CHF order set utilized -Given Lasix 60 mg IV x 1 in the ER and will repeat with 40 mg IV BID -Continue [place] O2 for now, prn BIPAP in case she needs it again   AMS -Likely associated with hypercarbia -With elevated pCO2, she was started on BIPAP -pCO2 improved and she is currently not needing BIPAP but will keep available for now -Hold Neurontin, Percocet -Continue Xanax to prevent withdrawal  -Will add delirium precautions   COPD -Patient with baseline COPD, on 2-3L home O2 -She does not appear to be in acute exacerbation but has marked hypercarbia -No steroids or antibiotics for now -Standing Duonebs and prn Albuterol -Continue Breo -prn BIPAP vs. [place] O2   H/o UTI -Son reports recent clinical UTI, started empiric Macrobid BID -Current UA is normal -Will hold additional abx for now -Urine culture is pending   AKI on Stage 3 CKD -Stage 3b CKD at baseline, currently about 25% worse than baseline -Will follow with diuresis  DM -Continue Lantus at reduced dose (DM coordinator recommends 15 units qhs) -Cover with moderate-scale SSI   HTN -Hold Norvasc, hydralazine, Lopressor for now given soft BPs   HLD -Continue Lipitor   Gout -Continue allopurinol    Obesity -Body mass index was 35.9 kg/m previously; will request daily weights -Weight loss should be encouraged -Outpatient PCP/bariatric medicine f/u encouraged  DNR -I have discussed code status with the patient and her son and  they are in agreement that the patient would not desire resuscitation and would prefer to die a natural death should that situation arise. -She will need a gold out of facility DNR form at the time of discharge      Advance Care Planning:   Code Status: DNR   Consults: CHF navigator; TOC team; nutrition; PT/OT  DVT Prophylaxis: Lovenox  Family Communication: Son was present throughout evaluation  Severity of Illness: The appropriate patient status for this patient is INPATIENT. Inpatient status is judged to be reasonable and necessary in order to provide the required intensity of service to ensure the patient's safety. The patient's presenting symptoms, physical exam findings, and initial radiographic and laboratory data in the context of their chronic comorbidities is felt to place them at high risk for further clinical deterioration. Furthermore, it is not anticipated that the patient will be medically stable for discharge from the hospital within 2 midnights of admission.   * I certify that at the point of admission it is my clinical judgment that the patient will require inpatient hospital care spanning beyond 2 midnights from the point of admission due to high intensity of service, high risk for further deterioration and high frequency of surveillance required.*  Author: Karmen Bongo, MD 05/14/2022 2:55 PM  For on call review [email or web address].

## 2022-05-14 NOTE — Progress Notes (Signed)
Patient transferred from ED on HFNC. RT added sterile H20 for humidity. BIPAP not indicated at this time. No distress noted. Equipment not in room .

## 2022-05-14 NOTE — ED Notes (Signed)
Provided meal

## 2022-05-14 NOTE — Inpatient Diabetes Management (Signed)
Inpatient Diabetes Program Recommendations  AACE/ADA: New Consensus Statement on Inpatient Glycemic Control (2015)  Target Ranges:  Prepandial:   less than 140 mg/dL      Peak postprandial:   less than 180 mg/dL (1-2 hours)      Critically ill patients:  140 - 180 mg/dL   Lab Results  Component Value Date   GLUCAP 164 (H) 11/08/2020   HGBA1C 7.9 (H) 11/04/2020    Review of Glycemic Control  Diabetes history: DM2 Outpatient Diabetes medications: Lantus 30 units q hs, Novolog 12 units tid meal coverage Current orders for Inpatient glycemic control: Semglee 25 units, Novolog correction 0-9 units tid, 0-5 units hs  Inpatient Diabetes Program Recommendations:  Patient currently in ED.  Please consider: -Decrease Semglee to 15 units qd  Thank you, Nani Gasser. Hessie Varone, RN, MSN, CDE  Diabetes Coordinator Inpatient Glycemic Control Team Team Pager [phone] (8am-5pm) 05/14/2022 1:40 PM

## 2022-05-14 NOTE — ED Triage Notes (Signed)
Pt brought to ED by [facility] for evaluation of hypoxia and slide out of bed this morning with no injuries. Per EMS, dispatch reported to residence for fall. Upon arrival, pt appeared to have slid out of bed denies any injuries. Pt initial O2 noted to be 65% 3L [place] via home O2. Pt switched to NRB and tolerating well with O2 saturations 93-95% on 10L.  Pt also endorsing ongoing weakness for past three days.   EMS Vitals BP 125/76 HR 60-80 bpm RR 20

## 2022-05-14 NOTE — ED Provider Notes (Signed)
Care of patient assumed from Dr. Laverta Baltimore at 7 AM.  This patient presents for COPHF exacerbation.  Blood gas shows respiratory acidosis.  She is to be started on BiPAP.  She will require reassessment and admission. Physical Exam  BP (!) 157/68   Pulse 75   Temp 98.4 F (36.9 C) (Oral)   Resp (!) 22   SpO2 100%   Physical Exam Vitals and nursing note reviewed.  Constitutional:      General: She is not in acute distress.    Appearance: She is well-developed. She is ill-appearing. She is not toxic-appearing or diaphoretic.  HENT:     Head: Normocephalic and atraumatic.  Eyes:     Conjunctiva/sclera: Conjunctivae normal.  Cardiovascular:     Rate and Rhythm: Normal rate and regular rhythm.     Heart sounds: No murmur heard. Pulmonary:     Effort: Pulmonary effort is normal. No respiratory distress.     Breath sounds: Decreased breath sounds and wheezing present.     Comments: On BiPAP, tolerating well Chest:     Chest wall: No tenderness.  Abdominal:     Palpations: Abdomen is soft.     Tenderness: There is no abdominal tenderness.  Musculoskeletal:        General: No swelling.     Cervical back: Neck supple.     Right lower leg: Edema present.     Left lower leg: Edema present.  Skin:    General: Skin is warm and dry.  Neurological:     General: No focal deficit present.     Mental Status: She is alert.  Psychiatric:        Mood and Affect: Mood normal.        Behavior: Behavior normal.     Procedures  Procedures  ED Course / MDM    Medical Decision Making Amount and/or Complexity of Data Reviewed Labs: ordered.  Risk Prescription drug management. Decision regarding hospitalization.   Patient was started on BiPAP.  On reassessment, patient is tolerating BiPAP well.  Her son remains at bedside and is able to provide history.  He does live with her.  He states that she has been feeling unwell for the past 4 days.  She has a history of UTIs and this has been managed  with antibiotic prophylaxis.  She is currently on Macrobid for treatment of UTI.  He states that she had a similar presentation to the hospital 1.5 years ago.  He seems to feel that UTI triggers her respiratory illnesses.  Patient's lab work was notable for hyperkalemia.  She has received Lasix, Lokelma, and nebulized breathing treatment.  Initial EKG showed frequent PVCs.  This appears to have improved on cardiac monitor.  She remains hemodynamically stable.  She received 60 mg of IV Lasix.  At this time, she has not had urinary output.  At home, she takes 80 mg of oral Lasix daily.  Her current creatinine is slightly increased from baseline.  Following initiation of BiPAP, repeat blood gas showed improved respiratory acidosis.  Patient was admitted to hospitalist for further management.  CRITICAL CARE Performed by: Godfrey Pick   Total critical care time: 32 minutes  Critical care time was exclusive of separately billable procedures and treating other patients.  Critical care was necessary to treat or prevent imminent or life-threatening deterioration.  Critical care was time spent personally by me on the following activities: development of treatment plan with patient and/or surrogate as well as nursing, discussions  with consultants, evaluation of patient's response to treatment, examination of patient, obtaining history from patient or surrogate, ordering and performing treatments and interventions, ordering and review of laboratory studies, ordering and review of radiographic studies, pulse oximetry and re-evaluation of patient's condition.        Godfrey Pick, MD 05/14/22 1735

## 2022-05-14 NOTE — ED Provider Triage Note (Signed)
Emergency Medicine Provider Triage Evaluation Note  [name] , a 88 y.o. female  was evaluated in triage.  Pt complains of shortness of breath.  Patient initially called EMS for fall.  Upon arrival the patient slid out of bed but did not have any injuries.  Notably she was found to have an O2 sat of 65% on 3 L nasal cannula.  She was switched to a nonrebreather with sats at 93 to 95% on 10 L.  She states on my exam that she has been feeling poorly over the past 2 to 3 days.  She describes having nausea, vomiting and decreased appetite.  Additionally she states that she has been compliant on her Lasix however feels like she has gained a bit of fluid.  She denies fevers, new cough, diarrhea.  Review of Systems  Positive: See above Negative:   Physical Exam  BP (!) 127/58   Pulse 63   Temp 98.4 F (36.9 C) (Oral)   Resp 17   SpO2 100%  Gen:   Awake, alert Resp:  Tachypneic on nonrebreather MSK:   Moves extremities without difficulty  Other:  S1/S2 without murmur.  Medical Decision Making  Medically screening exam initiated at 5:01 AM.  Appropriate orders placed.  Elizette H Sayavong was informed that the remainder of the evaluation will be completed by another provider, this initial triage assessment does not replace that evaluation, and the importance of remaining in the ED until their evaluation is complete.  We will need room sooner, triage RN made aware.   Mickie Hillier, PA-C 05/14/22 0503

## 2022-05-14 NOTE — ED Provider Notes (Signed)
Emergency Department Provider Note   I have reviewed the triage vital signs and the nursing notes.   HISTORY  Chief Complaint Shortness of Breath   HPI [name] is a 88 y.o. female with PMH of CKD, COPD on 3L [place] baseline, dCHF, pericardial effusion s/p window, and HTN presents to the emergency department for evaluation of generalized weakness/dizziness causing her to slide to the floor out of bed this morning.  She is accompanied by her son who reports that he was there with her, trying to assist and she was slowly lowered to the ground without trauma.  She states she woke up feeling weak and dizzy.  EMS was called and note hypoxemia to the 60s.  She was placed on nonrebreather with improved oxygen saturation.  Patient denies any pain or pressure in the chest.  She has had some cough but no fevers or chills.  She notes some emesis and decreased appetite although no abdominal pain or diarrhea.  The son tells me that they have been treating for a urinary tract infection at home with Macrobid over the past 3 days and that this constellation of symptoms seems to run together often for her.  Her last admission was in 2022. She is complaint with her lasix.    Past Medical History:  Diagnosis Date   Anasarca 04/2009   found to be secondary to pericardial effusion with tamponade/pericardial window done   Chronic diastolic CHF (congestive heart failure) (HCC)    CKD (chronic kidney disease), stage III (HCC)    COPD (chronic obstructive pulmonary disease) ([place])    Coronary artery disease    a. mild-mod CAD 2004.   Diabetes mellitus    insulin dependent   Herpes labialis    Healing herpes labialis   Hypercholesterolemia    Hypertension    Lumbar radiculopathy    Lumbar scoliosis    Morbid obesity ([place])    Pericardial effusion 04/2009   a. 2010 with tamponade s/p window.   PONV (postoperative nausea and vomiting)    has not had any problems since 2001   Spondylolisthesis     Spondylosis     Review of Systems  Constitutional: No fever/chills. Positive weakness and dizziness.  Eyes: No visual changes. ENT: No sore throat. Cardiovascular: Denies chest pain. Respiratory: Positive shortness of breath. Gastrointestinal: No abdominal pain.  Positive vomiting.  No diarrhea.  No constipation. Genitourinary: Negative for dysuria. Musculoskeletal: Negative for back pain. Skin: Negative for rash. Neurological: Negative for headaches, focal weakness or numbness.   ____________________________________________   PHYSICAL EXAM:  VITAL SIGNS: ED Triage Vitals [05/14/22 0456]  Enc Vitals Group     BP (!) 127/58     Pulse Rate 63     Resp 17     Temp 98.4 F (36.9 C)     Temp Source Oral     SpO2 100 %   Constitutional: Alert and oriented. Well appearing and in no acute distress. Patient conversational on NRB mask. No somnolence.  Eyes: Conjunctivae are normal. Head: Atraumatic. Nose: No congestion/rhinnorhea. Mouth/Throat: Mucous membranes are slightly dry.  Neck: No stridor.  Cardiovascular: Normal rate, regular rhythm. Good peripheral circulation. Grossly normal heart sounds.   Respiratory: Normal respiratory effort.  No retractions. Lungs with rales at the bases. No rhonchi or wheezing.  Gastrointestinal: Soft and nontender. No distention.  Musculoskeletal: No lower extremity tenderness with 2+ pitting edema in the bilateral LEs. No gross deformities of extremities. Neurologic:  Normal speech and language.  No gross focal neurologic deficits are appreciated.  Skin:  Skin is warm, dry and intact. No rash noted.  ____________________________________________   LABS (all labs ordered are listed, but only abnormal results are displayed)  Labs Reviewed  BASIC METABOLIC PANEL - Abnormal; Notable for the following components:      Result Value   Sodium 134 (*)    Potassium 5.9 (*)    Chloride 94 (*)    Glucose, Bld 156 (*)    BUN 46 (*)    Creatinine,  Ser 1.91 (*)    GFR, Estimated 25 (*)    Anion gap 16 (*)    All other components within normal limits  BRAIN NATRIURETIC PEPTIDE - Abnormal; Notable for the following components:   B Natriuretic Peptide 579.2 (*)    All other components within normal limits  CBC WITH DIFFERENTIAL/PLATELET - Abnormal; Notable for the following components:   WBC 13.1 (*)    Hemoglobin 10.8 (*)    MCHC 29.6 (*)    RDW 15.8 (*)    Neutro Abs 10.8 (*)    Abs Immature Granulocytes 0.25 (*)    All other components within normal limits  URINALYSIS, ROUTINE W REFLEX MICROSCOPIC - Abnormal; Notable for the following components:   APPearance HAZY (*)    All other components within normal limits  HEPATIC FUNCTION PANEL - Abnormal; Notable for the following components:   Total Protein 6.4 (*)    All other components within normal limits  GLUCOSE, CAPILLARY - Abnormal; Notable for the following components:   Glucose-Capillary 134 (*)    All other components within normal limits  I-STAT VENOUS BLOOD GAS, ED - Abnormal; Notable for the following components:   pH, Ven 7.236 (*)    pCO2, Ven 86.9 (*)    pO2, Ven 172 (*)    Bicarbonate 36.9 (*)    TCO2 40 (*)    Acid-Base Excess 7.0 (*)    Sodium 130 (*)    Potassium 5.7 (*)    All other components within normal limits  I-STAT VENOUS BLOOD GAS, ED - Abnormal; Notable for the following components:   pCO2, Ven 72.9 (*)    Bicarbonate 34.3 (*)    TCO2 36 (*)    Acid-Base Excess 6.0 (*)    Sodium 131 (*)    Potassium 5.3 (*)    Calcium, Ion 1.14 (*)    HCT 32.0 (*)    Hemoglobin 10.9 (*)    All other components within normal limits  RESP PANEL BY RT-PCR (FLU A&B, COVID) ARPGX2  CULTURE, BLOOD (ROUTINE X 2)  CULTURE, BLOOD (ROUTINE X 2)  URINE CULTURE  LIPASE, BLOOD  LACTIC ACID, PLASMA  LACTIC ACID, PLASMA  HEMOGLOBIN T4H  BASIC METABOLIC PANEL  CBC  TROPONIN I (HIGH SENSITIVITY)  TROPONIN I (HIGH SENSITIVITY)    ____________________________________________  EKG   EKG Interpretation  Date/Time:  Tuesday May 14 2022 06:57:51 EDT Ventricular Rate:  77 PR Interval:  239 QRS Duration: 133 QT Interval:  409 QTC Calculation: 463 R Axis:   -70 Text Interpretation: Sinus rhythm Premature ventricular complexes Prolonged PR interval Confirmed by Godfrey Pick ([id number]) on 05/14/2022 7:05:06 AM        ____________________________________________  RADIOLOGY  ECHOCARDIOGRAM COMPLETE  Result Date: 05/14/2022    ECHOCARDIOGRAM REPORT   Patient Name:   [name] Date of Exam: 05/14/2022 Medical Rec #:  [id number]    Height:       64.0 in Accession #:  [id number]   Weight:       209.0 lb Date of Birth:  Nov 12, 1930     BSA:          1.993 m Patient Age:    52 years     BP:           140/64 mmHg Patient Gender: F            HR:           65 bpm. Exam Location:  Inpatient Procedure: 2D Echo and Intracardiac Opacification Agent Indications:    acute systolic chf  History:        Patient has prior history of Echocardiogram examinations, most                 recent 11/03/2020. CHF, CAD, COPD and chronic kidney disease;                 Risk Factors:Diabetes, Dyslipidemia and Hypertension.  Sonographer:    Johny Chess RDCS Referring Phys: Karmen Bongo  Sonographer Comments: Image acquisition challenging due to patient body habitus and Image acquisition challenging due to respiratory motion. IMPRESSIONS  1. Left ventricular ejection fraction, by estimation, is 60 to 65%. The left ventricle has normal function. The left ventricle has no regional wall motion abnormalities. There is mild concentric left ventricular hypertrophy. Left ventricular diastolic parameters are consistent with Grade II diastolic dysfunction (pseudonormalization). Elevated left atrial pressure.  2. Right ventricular systolic function is normal. The right ventricular size is normal. Tricuspid regurgitation signal is inadequate for assessing PA  pressure.  3. Left atrial size was moderately dilated.  4. The mitral valve is degenerative. Trivial mitral valve regurgitation. No evidence of mitral stenosis. Moderate mitral annular calcification.  5. The aortic valve is tricuspid. There is mild calcification of the aortic valve. There is mild thickening of the aortic valve. Aortic valve regurgitation is not visualized. Aortic valve sclerosis/calcification is present, without any evidence of aortic stenosis.  6. The inferior vena cava is normal in size with greater than 50% respiratory variability, suggesting right atrial pressure of 3 mmHg. Comparison(s): Prior images reviewed side by side. There is evidence of high mean left atrial pressure, based on E/e' ratio. FINDINGS  Left Ventricle: Left ventricular ejection fraction, by estimation, is 60 to 65%. The left ventricle has normal function. The left ventricle has no regional wall motion abnormalities. Definity contrast agent was given IV to delineate the left ventricular  endocardial borders. The left ventricular internal cavity size was normal in size. There is mild concentric left ventricular hypertrophy. Left ventricular diastolic parameters are consistent with Grade II diastolic dysfunction (pseudonormalization). Elevated left atrial pressure. Right Ventricle: The right ventricular size is normal. No increase in right ventricular wall thickness. Right ventricular systolic function is normal. Tricuspid regurgitation signal is inadequate for assessing PA pressure. Left Atrium: Left atrial size was moderately dilated. Right Atrium: Right atrial size was normal in size. Pericardium: There is no evidence of pericardial effusion. Mitral Valve: The mitral valve is degenerative in appearance. There is mild thickening of the mitral valve leaflet(s). Moderate mitral annular calcification. Trivial mitral valve regurgitation. No evidence of mitral valve stenosis. Tricuspid Valve: The tricuspid valve is normal in  structure. Tricuspid valve regurgitation is trivial. Aortic Valve: The aortic valve is tricuspid. There is mild calcification of the aortic valve. There is mild thickening of the aortic valve. Aortic valve regurgitation is not visualized. Aortic valve sclerosis/calcification is present, without any evidence of aortic stenosis.  Pulmonic Valve: The pulmonic valve was grossly normal. Pulmonic valve regurgitation is trivial. Aorta: The aortic root and ascending aorta are structurally normal, with no evidence of dilitation. Venous: The inferior vena cava is normal in size with greater than 50% respiratory variability, suggesting right atrial pressure of 3 mmHg. IAS/Shunts: No atrial level shunt detected by color flow Doppler.  LEFT VENTRICLE PLAX 2D LVIDd:         4.80 cm   Diastology LVIDs:         2.80 cm   LV e' medial:    6.85 cm/s LV PW:         1.20 cm   LV E/e' medial:  16.1 LV IVS:        1.00 cm   LV e' lateral:   7.29 cm/s LVOT diam:     1.80 cm   LV E/e' lateral: 15.1 LV SV:         62 LV SV Index:   31 LVOT Area:     2.54 cm  RIGHT VENTRICLE            IVC RV S prime:     7.72 cm/s  IVC diam: 2.00 cm TAPSE (M-mode): 1.4 cm LEFT ATRIUM             Index        RIGHT ATRIUM           Index LA diam:        4.60 cm 2.31 cm/m   RA Area:     18.50 cm LA Vol (A2C):   51.3 ml 25.74 ml/m  RA Volume:   51.00 ml  25.59 ml/m LA Vol (A4C):   75.6 ml 37.93 ml/m LA Biplane Vol: 65.0 ml 32.61 ml/m  AORTIC VALVE LVOT Vmax:   116.00 cm/s LVOT Vmean:  77.400 cm/s LVOT VTI:    0.242 m  AORTA Ao Root diam: 2.90 cm Ao Asc diam:  2.90 cm MITRAL VALVE MV Area (PHT): 3.42 cm     SHUNTS MV Decel Time: 222 msec     Systemic VTI:  0.24 m MV E velocity: 110.00 cm/s  Systemic Diam: 1.80 cm MV A velocity: 127.00 cm/s MV E/A ratio:  0.87 Mihai Croitoru MD Electronically signed by Sanda Klein MD Signature Date/Time: 05/14/2022/6:11:10 PM    Final    DG Chest 1 View  Result Date: 05/14/2022 CLINICAL DATA:  88 year old female  with shortness of breath. EXAM: CHEST  1 VIEW COMPARISON:  Portable chest 11/03/2020 and earlier. FINDINGS: AP view at 0512 hours. Slightly lower lung volumes. Stable cardiac size at the upper limits of normal. Other mediastinal contours are within normal limits. Diffuse pulmonary vascular congestion with indistinct appearance of the vasculature, Kerley B lines in the right lung. Trace right minor fissure pleural fluid. No pneumothorax or consolidation. No layering pleural effusion. No acute osseous abnormality identified. Negative visible bowel gas. IMPRESSION: Acute Pulmonary Edema with trace pleural fluid. Electronically Signed   By: Genevie Ann M.D.   On: 05/14/2022 05:59    ____________________________________________   PROCEDURES  Procedure(s) performed:   .Critical Care  Performed by: Margette Fast, MD Authorized by: Margette Fast, MD   Critical care provider statement:    Critical care time (minutes):  35   Critical care time was exclusive of:  Separately billable procedures and treating other patients and teaching time   Critical care was necessary to treat or prevent imminent or life-threatening deterioration of the following  conditions:  Respiratory failure   Critical care was time spent personally by me on the following activities:  Development of treatment plan with patient or surrogate, discussions with consultants, evaluation of patient's response to treatment, examination of patient, ordering and review of laboratory studies, ordering and review of radiographic studies, ordering and performing treatments and interventions, pulse oximetry, re-evaluation of patient's condition, review of old charts and obtaining history from patient or surrogate   I assumed direction of critical care for this patient from another provider in my specialty: no     Care discussed with: admitting provider     ___________________________________________   INITIAL IMPRESSION / [place] / ED  COURSE  Pertinent labs & imaging results that were available during my care of the patient were reviewed by me and considered in my medical decision making (see chart for details).   This patient is Presenting for Evaluation of SOB, which does require a range of treatment options, and is a complaint that involves a high risk of morbidity and mortality.  The Differential Diagnoses include pulmonary edema, COVID, Flu, PE, ACE, CAP.  I did obtain Additional Historical Information from son at bedside.  I decided to review pertinent External Data, and in summary ECHO from 10/2020 with EF 60-65%. Last admit was 2022. Reviewed discharge summary.   Clinical Laboratory Tests Ordered, included mild Lucas ptosis with anemia to 10.8.  Chronic kidney disease to 1.91 slightly worse than prior and potassium of 5.9.   Radiologic Tests Ordered, included CXR. I independently interpreted the images and agree with radiology interpretation.   Cardiac Monitor Tracing which shows NSR.   Social Determinants of Health Risk patient is a non-smoker.   Medical Decision Making: Summary:  Patient presents emergency department with hypoxemia and weakness.  She overall appears very comfortable from a respiratory standpoint.  She is speaking in full sentences on additional supplemental oxygen and reports feeling much better now at rest.  She does not have significant increased work of breathing to prompt BiPAP usage.  She is not severely hypertensive to suggest flash pulmonary edema.  Creatinine is slightly elevated compared to baseline values.  Potassium of 5.9 noted.  Plan for some shifting here and Lasix. Will try to transition to HFNC.   Reevaluation with update and discussion with patient at 6:58 AM  Patient tolerating HFNC well. O2 improved.   Care transferred to Dr. Doren Custard.  Disposition: anticipate admit  ____________________________________________  FINAL CLINICAL IMPRESSION(S) / ED DIAGNOSES  Final  diagnoses:  Acute on chronic respiratory failure with hypoxia and hypercapnia ([place])    Note:  This document was prepared using Dragon voice recognition software and may include unintentional dictation errors.  Nanda Quinton, MD, [facility] Emergency Medicine    Jarian Longoria, [facility], MD 05/14/22 [phone]

## 2022-05-14 NOTE — Progress Notes (Signed)
  Echocardiogram 2D Echocardiogram has been performed.  Johny Chess 05/14/2022, 5:59 PM

## 2022-05-14 NOTE — Progress Notes (Signed)
Pt taken off bipap by RT and placed on 6L HFNC. Pt tolerating well, MD notified, RN aware, RT will monitor.

## 2022-05-15 ENCOUNTER — Other Ambulatory Visit: Payer: Self-pay

## 2022-05-15 DIAGNOSIS — N179 Acute kidney failure, unspecified: Secondary | ICD-10-CM

## 2022-05-15 DIAGNOSIS — J9621 Acute and chronic respiratory failure with hypoxia: Secondary | ICD-10-CM | POA: Diagnosis not present

## 2022-05-15 DIAGNOSIS — E871 Hypo-osmolality and hyponatremia: Secondary | ICD-10-CM

## 2022-05-15 DIAGNOSIS — J410 Simple chronic bronchitis: Secondary | ICD-10-CM

## 2022-05-15 DIAGNOSIS — Z794 Long term (current) use of insulin: Secondary | ICD-10-CM

## 2022-05-15 DIAGNOSIS — Z66 Do not resuscitate: Secondary | ICD-10-CM

## 2022-05-15 DIAGNOSIS — E1142 Type 2 diabetes mellitus with diabetic polyneuropathy: Secondary | ICD-10-CM

## 2022-05-15 DIAGNOSIS — N189 Chronic kidney disease, unspecified: Secondary | ICD-10-CM

## 2022-05-15 DIAGNOSIS — E782 Mixed hyperlipidemia: Secondary | ICD-10-CM

## 2022-05-15 DIAGNOSIS — G9341 Metabolic encephalopathy: Secondary | ICD-10-CM

## 2022-05-15 DIAGNOSIS — D649 Anemia, unspecified: Secondary | ICD-10-CM

## 2022-05-15 DIAGNOSIS — I10 Essential (primary) hypertension: Secondary | ICD-10-CM

## 2022-05-15 DIAGNOSIS — I5033 Acute on chronic diastolic (congestive) heart failure: Secondary | ICD-10-CM | POA: Diagnosis not present

## 2022-05-15 DIAGNOSIS — R29818 Other symptoms and signs involving the nervous system: Secondary | ICD-10-CM

## 2022-05-15 LAB — CBC
HCT: 31.3 % — ABNORMAL LOW (ref 36.0–46.0)
Hemoglobin: 9.3 g/dL — ABNORMAL LOW (ref 12.0–15.0)
MCH: 27.1 pg (ref 26.0–34.0)
MCHC: 29.7 g/dL — ABNORMAL LOW (ref 30.0–36.0)
MCV: 91.3 fL (ref 80.0–100.0)
Platelets: 201 10*3/uL (ref 150–400)
RBC: 3.43 MIL/uL — ABNORMAL LOW (ref 3.87–5.11)
RDW: 15.6 % — ABNORMAL HIGH (ref 11.5–15.5)
WBC: 9.6 10*3/uL (ref 4.0–10.5)
nRBC: 0 % (ref 0.0–0.2)

## 2022-05-15 LAB — BLOOD GAS, VENOUS
Acid-Base Excess: 8.7 mmol/L — ABNORMAL HIGH (ref 0.0–2.0)
Bicarbonate: 37.2 mmol/L — ABNORMAL HIGH (ref 20.0–28.0)
Drawn by: 426281
O2 Saturation: 63.7 %
Patient temperature: 37
pCO2, Ven: 69 mmHg — ABNORMAL HIGH (ref 44–60)
pH, Ven: 7.34 (ref 7.25–7.43)
pO2, Ven: 40 mmHg (ref 32–45)

## 2022-05-15 LAB — GLUCOSE, CAPILLARY
Glucose-Capillary: 85 mg/dL (ref 70–99)
Glucose-Capillary: 153 mg/dL — ABNORMAL HIGH (ref 70–99)
Glucose-Capillary: 142 mg/dL — ABNORMAL HIGH (ref 70–99)
Glucose-Capillary: 226 mg/dL — ABNORMAL HIGH (ref 70–99)

## 2022-05-15 LAB — HEMOGLOBIN A1C
Hgb A1c MFr Bld: 6.9 % — ABNORMAL HIGH (ref 4.8–5.6)
Mean Plasma Glucose: 151.33 mg/dL

## 2022-05-15 LAB — BASIC METABOLIC PANEL
Anion gap: 7 (ref 5–15)
BUN: 49 mg/dL — ABNORMAL HIGH (ref 8–23)
CO2: 32 mmol/L (ref 22–32)
Calcium: 8.4 mg/dL — ABNORMAL LOW (ref 8.9–10.3)
Chloride: 95 mmol/L — ABNORMAL LOW (ref 98–111)
Creatinine, Ser: 1.93 mg/dL — ABNORMAL HIGH (ref 0.44–1.00)
GFR, Estimated: 25 mL/min — ABNORMAL LOW (ref >60)
Glucose, Bld: 92 mg/dL (ref 70–99)
Potassium: 4.8 mmol/L (ref 3.5–5.1)
Sodium: 134 mmol/L — ABNORMAL LOW (ref 135–145)

## 2022-05-15 LAB — URINE CULTURE: Culture: NO GROWTH

## 2022-05-15 MED ORDER — IPRATROPIUM-ALBUTEROL 0.5-2.5 (3) MG/3ML IN SOLN
3.0000 mL | Freq: Two times a day (BID) | RESPIRATORY_TRACT | Status: DC
  Administered 2022-05-15 – 2022-05-21 (×12): 3 mL via RESPIRATORY_TRACT
  Filled 2022-05-15 (×11): qty 3

## 2022-05-15 MED ORDER — ADULT MULTIVITAMIN W/MINERALS CH
1.0000 | ORAL_TABLET | Freq: Every day | ORAL | Status: DC
  Administered 2022-05-15 – 2022-05-21 (×7): 1 via ORAL
  Filled 2022-05-15 (×7): qty 1

## 2022-05-15 MED ORDER — GLUCERNA SHAKE PO LIQD
237.0000 mL | ORAL | Status: DC
  Administered 2022-05-15 – 2022-05-17 (×3): 237 mL via ORAL

## 2022-05-15 NOTE — Progress Notes (Signed)
No distress noted at this time.  BIPAP order available if patient shows signs of respiratory distress.

## 2022-05-15 NOTE — Evaluation (Signed)
Physical Therapy Evaluation Patient Details Name: [name] MRN: [id number] DOB: 1931-01-23 Today's Date: 05/15/2022  History of Present Illness  Pt is an 88 year old woman admitted on 05/14/22 after falling out of bed, with hypoxia, weakness and dizziness. Recent UTI.PMH: CKD, COPD on 3L 02, CHF, pericardial effusion, HTN, anasarca, DM, morbid obesity.  Clinical Impression   Pt presented supine in bed with HOB elevated, awake and willing to participate in therapy session. Prior to admission, pt reported that she uses a manual w/c as her primary means for mobility and was independent with ADLs. Assistance from son for IADLs. [name] in a two level home (lives on main level) with a ramped entrance. At the time of evaluation, pt with generalized weakness and on 12L of HFNC with SpO2 decreasing to as low as 79% (terrible waveform) and at 93% at rest with a good waveform. She was able to complete bed mobility with min A and transfers with use of RW and min A for stability. Pt would continue to benefit from skilled physical therapy services at this time while admitted and after d/c to address the below listed limitations in order to improve overall safety and independence with functional mobility.      Recommendations for follow up therapy are one component of a multi-disciplinary discharge planning process, led by the attending physician.  Recommendations may be updated based on patient status, additional functional criteria and insurance authorization.  Follow Up Recommendations Home health PT      Assistance Recommended at Discharge Frequent or constant Supervision/Assistance  Patient can return home with the following  A little help with walking and/or transfers;A little help with bathing/dressing/bathroom;Assistance with cooking/housework;Direct supervision/assist for medications management;Assist for transportation;Help with stairs or ramp for entrance    Equipment  Recommendations None recommended by PT  Recommendations for Other Services       Functional Status Assessment Patient has had a recent decline in their functional status and demonstrates the ability to make significant improvements in function in a reasonable and predictable amount of time.     Precautions / Restrictions Precautions Precautions: Fall Precaution Comments: monitor SpO2 on 12L HFNC Restrictions Weight Bearing Restrictions: No      Mobility  Bed Mobility Overal bed mobility: Needs Assistance Bed Mobility: Supine to Sit     Supine to sit: Min assist, HOB elevated     General bed mobility comments: assist with bed pad for hips to EOB    Transfers Overall transfer level: Needs assistance Equipment used: Rolling walker (2 wheels) Transfers: Sit to/from Stand, Bed to chair/wheelchair/BSC Sit to Stand: Min assist   Step pivot transfers: Min assist       General transfer comment: cues for hand placement, steadying assist, pt with posterior bias and fear of falling    Ambulation/Gait               General Gait Details: pt reported that she uses a w/c as primary means of mobility at home; however, later stated that she can take a few steps with RW to get into her bathroom  Stairs            Wheelchair Mobility    Modified Rankin (Stroke Patients Only)       Balance Overall balance assessment: Needs assistance Sitting-balance support: Feet supported, Bilateral upper extremity supported Sitting balance-Leahy Scale: Poor Sitting balance - Comments: posterior lean, cannot release UE supports Postural control: Posterior lean Standing balance support: Bilateral  upper extremity supported Standing balance-Leahy Scale: Poor Standing balance comment: posterior lean                             Pertinent Vitals/Pain Pain Assessment Pain Assessment: No/denies pain    Home Living Family/patient expects to be discharged to:: Private  residence Living Arrangements: Children (son) Available Help at Discharge: Available 24 hours/day;Family Type of Home: House Home Access: Ramped entrance       Home Layout: Two level;Able to live on main level with bedroom/bathroom Home Equipment: Wheelchair - manual;Grab bars - tub/shower;Rolling Environmental consultant (2 wheels);Hand held shower head;Adaptive equipment      Prior Function Prior Level of Function : Needs assist             Mobility Comments: transfers independently to manual w/c with RW ADLs Comments: sits to shower,uses AE for LB ADLs, son assists with IADLs     Hand Dominance   Dominant Hand: Right    Extremity/Trunk Assessment   Upper Extremity Assessment Upper Extremity Assessment: Defer to OT evaluation    Lower Extremity Assessment Lower Extremity Assessment: Generalized weakness    Cervical / Trunk Assessment Cervical / Trunk Assessment: Other exceptions (morbid obesity)  Communication   Communication: No difficulties;Other (comment) (verbose)  Cognition Arousal/Alertness: Awake/alert Behavior During Therapy: Anxious Overall Cognitive Status: Within Functional Limits for tasks assessed                                 General Comments: fearful of falling, decreased attention        General Comments General comments (skin integrity, edema, etc.): Pt on 12L 02, 93% at rest with good waveform.    Exercises     Assessment/Plan    PT Assessment Patient needs continued PT services  PT Problem List Decreased strength;Decreased activity tolerance;Decreased balance;Decreased mobility;Decreased coordination;Decreased knowledge of use of DME;Decreased safety awareness;Decreased knowledge of precautions;Cardiopulmonary status limiting activity       PT Treatment Interventions DME instruction;Gait training;Functional mobility training;Therapeutic activities;Balance training;Therapeutic exercise;Neuromuscular re-education;Patient/family education     PT Goals (Current goals can be found in the Care Plan section)  Acute Rehab PT Goals Patient Stated Goal: "home" PT Goal Formulation: With patient Time For Goal Achievement: 05/29/22 Potential to Achieve Goals: Good    Frequency Min 3X/week     Co-evaluation PT/OT/SLP Co-Evaluation/Treatment: Yes Reason for Co-Treatment: To address functional/ADL transfers;For patient/therapist safety PT goals addressed during session: Mobility/safety with mobility;Balance;Proper use of DME;Strengthening/ROM         AM-PAC PT "6 Clicks" Mobility  Outcome Measure Help needed turning from your back to your side while in a flat bed without using bedrails?: A Little Help needed moving from lying on your back to sitting on the side of a flat bed without using bedrails?: A Little Help needed moving to and from a bed to a chair (including a wheelchair)?: A Little Help needed standing up from a chair using your arms (e.g., wheelchair or bedside chair)?: A Little Help needed to walk in hospital room?: A Little Help needed climbing 3-5 steps with a railing? : Total 6 Click Score: 16    End of Session Equipment Utilized During Treatment: Oxygen (12L HFNC) Activity Tolerance: Patient tolerated treatment well Patient left: in chair;with call bell/phone within reach;with chair alarm set Nurse Communication: Mobility status PT Visit Diagnosis: Other abnormalities of gait and mobility (R26.89)  Time: 0850-0920 PT Time Calculation (min) (ACUTE ONLY): 30 min   Charges:   PT Evaluation $PT Eval Moderate Complexity: 1 Mod          Eduard Clos, PT, DPT  Acute Rehabilitation Services Office [place] 05/15/2022, 9:52 AM

## 2022-05-15 NOTE — Progress Notes (Signed)
PROGRESS NOTE  [name] WNU:[id number] DOB: [birth year]   PCP: Hoyt Koch, MD  Patient is from: Home.  DOA: 05/14/2022 LOS: 1  Chief complaints Chief Complaint  Patient presents with   Shortness of Breath     Brief Narrative / Interim history: 88 year old F with PMH of chronic diastolic CHF, COPD, chronic hypoxic RF on 3 L, recent "UTI", CKD-3, CAD, DM-2, morbid obesity, HTN and HLD presenting with confusion and hypoxia to 65% on home 3 L.  Family reports recent diagnosis of "UTI" and she increased her Macrobid to twice daily for 7 days.  However, she had no UTI symptoms on presentation.  In ED, 100% on NRB.  Other vitals normal.  WBC 13.1 with left shift.  Hgb 10.8.  580.  Troponin 12> 17 lactic acid 0.7.  NA 134.  K5.9. Cr 1.91.  BUN 46.  AG 16.  Bicarb 24.  COVID-19 and influenza PCR nonreactive.  Portable CXR with acute pulmonary edema with trace pleural effusion.  VBG 7.23/87/172/37.  UA and cultures drawn.  TTE ordered.  Patient was started on BiPAP and IV Lasix.  Admitted for acute on chronic respiratory failure with hypoxia and hypercapnia in the setting of acute on chronic diastolic CHF, and acute metabolic encephalopathy and AKI.  The next day, patient's respiratory status, blood gas and mental status.  Urine and blood cultures NGTD.  Remained on IV Lasix for further diuresis.  Subjective: Seen and examined earlier this morning.  No major events overnight of this morning.  She was weaned to 14 L by HFNC.  I have requested RN to wean further to minimum oxygen to keep saturation above 88%.  Patient has no complaints other than feeling cold and wanting to go back to bed after sitting on bedside chair.  She denies chest pain, shortness of breath, cough, GI or UTI symptoms.  Objective: Vitals:   05/15/22 1123 05/15/22 1222 05/15/22 1320 05/15/22 1455  BP:  (!) 133/57    Pulse:  72    Resp:  12    Temp:  98.2 F (36.8 C)    TempSrc:  Oral    SpO2: 90% 90% (!) 89%  90%  Weight:      Height:        Examination:  GENERAL: Sitting on the edge of the bed.  Staff at bedside. HEENT: MMM.  Vision and hearing grossly intact.  NECK: Supple.  No apparent JVD.  RESP:  No IWOB.  Fair aeration bilaterally. CVS:  RRR. Heart sounds normal.  ABD/GI/GU: BS+. Abd soft, NTND.  MSK/EXT:  Moves extremities.  LLE edema (2+)> RLE edema (1+).  Chronic as symmetric per patient. SKIN: no apparent skin lesion or wound NEURO: Awake, alert and oriented appropriately.  No apparent focal neuro deficit. PSYCH: Calm. Normal affect.   Procedures:  None  Microbiology summarized: IHKVQ-25 and influenza PCR nonreactive. Urine culture NGTD Blood culture NGTD  Assessment and plan: Principal Problem:   Acute on chronic respiratory failure with hypoxia and hypercapnia (HCC) Active Problems:   Hypertension, essential   Mixed dyslipidemia   Diabetes with neurologic complications (HCC)   COPD (chronic obstructive pulmonary disease) (HCC)   Acute on chronic diastolic CHF (congestive heart failure) (HCC)   Acute kidney injury superimposed on chronic kidney disease ([place])   Acute metabolic encephalopathy   Morbid obesity ([place])   Suspected sleep apnea   DNR (do not resuscitate)   Hyponatremia   Normocytic anemia  Acute on chronic respiratory failure  with hypoxia and hypercapnia: Reportedly desaturated to 65% on home 3 L at home.  Elevated BNP and CXR suggests acute on chronic diastolic CHF. VBG 7.23/87/172/37 suggests obstructive etiology but she does not seem to be in COPD exacerbation on exam.  Respiratory symptoms, oxygen requirement, blood gas improved. -Continue weaning oxygen as able.  -Minimum oxygen to keep saturation above 88%.  Patient is at risk for CO2 retention. -BiPAP at night and when she naps. -She would benefit from outpatient sleep study to rule out sleep apnea  Acute on chronic diastolic CHF: TTE with normal LVEF but G2 DD.  BNP elevated to 580.  CXR with  pulmonary edema.She has LLE edema (2+)> RLE edema (1+).  Reports chronic asymmetric edema.  Has bibasilar crackles.  No JVD but sitting upright on the edge of the bed.  Respiratory symptoms improved.  I and O incomplete.  -Continue IV diuretics today. -Monitor intake and output, daily weight, renal functions and electrolytes -BiPAP as needed  Acute metabolic encephalopathy: Likely due to hypercapnia.  She is also on opiates, low-dose gabapentin and Ativan.  Seems to have resolved.  No focal neurodeficit on exam. -Manage respiratory failure as above -Minimize sedating medications -Reorientation and delirium precautions  AKI/azotemia on CKD-3B: Baseline creatinine seems to be about 1.5. Recent Labs    11/12/21 1355 01/22/22 1549 05/14/22 0507 05/15/22 0240  BUN 44* 38* 46* 49*  CREATININE 1.61* 1.47* 1.91* 1.93*  -Continue monitoring while on diuretics. -Check CK  Chronic COPD -Continue Breo Ellipta. -Continue DuoNeb twice daily and as needed albuterol -Minimum oxygen   H/o UTI-no UTI symptoms.  UA and urine culture negative here.  Was on Macrobid twice daily outpatient -Hold antibiotics.  Controlled IDDM-2 with CKD-3B, HLD and hyperglycemia: A1c 6.9%. Recent Labs  Lab 05/14/22 2051 05/15/22 0855 05/15/22 1221 05/15/22 1606  GLUCAP 134* 85 153* 142*  -Continue current insulin regimen -Continue Lipitor.  Hyponatremia: Likely hypervolemic.  Improved.  Normocytic anemia: Hgb slightly lower.  No evidence of bleeding. Recent Labs    01/22/22 1549 05/14/22 0507 05/14/22 0658 05/14/22 0904 05/15/22 0240  HGB 11.1* 10.8* 12.2 10.9* 9.3*  -Continue monitoring   Essential hypertension: Normotensive. -Continue holding Norvasc and hydralazine. -Resume metoprolol at reduced dose   Gout -Continue allopurinol  Physical deconditioning -PT/OT eval   Goal of care counseling/DNR  Morbid obesity: Elevated BMI with comorbid conditions as above. Body mass index is 37.84  kg/m. -Encourage lifestyle change to lose weight           DVT prophylaxis:  enoxaparin (LOVENOX) injection 30 mg Start: 05/14/22 1230  Code Status: DNR/DNI Family Communication: None at bedside Level of care: Progressive Status is: Inpatient Remains inpatient appropriate because: Acute on chronic respiratory failure with hypoxia and hypercapnia and acute on chronic diastolic CHF, AKI   Final disposition: TBD Consultants:  None  Sch Meds:  Scheduled Meds:  allopurinol  100 mg Oral Daily   aspirin EC  81 mg Oral q morning   atorvastatin  40 mg Oral q morning   docusate sodium  100 mg Oral BID   enoxaparin (LOVENOX) injection  30 mg Subcutaneous Q24H   fluticasone  2 spray Each Nare Daily   fluticasone furoate-vilanterol  1 puff Inhalation Daily   furosemide  40 mg Intravenous BID   gabapentin  100 mg Oral BID   insulin aspart  0-5 Units Subcutaneous QHS   insulin aspart  0-9 Units Subcutaneous TID WC   insulin glargine-yfgn  15 Units Subcutaneous  QHS   ipratropium-albuterol  3 mL Nebulization BID   sodium chloride flush  3 mL Intravenous Q12H   Continuous Infusions: PRN Meds:.acetaminophen **OR** acetaminophen, albuterol, ALPRAZolam, bisacodyl, hydrALAZINE, ondansetron **OR** ondansetron (ZOFRAN) IV, polyethylene glycol, traZODone  Antimicrobials: Anti-infectives (From admission, onward)    None        I have personally reviewed the following labs and images: CBC: Recent Labs  Lab 05/14/22 0507 05/14/22 0658 05/14/22 0904 05/15/22 0240  WBC 13.1*  --   --  9.6  NEUTROABS 10.8*  --   --   --   HGB 10.8* 12.2 10.9* 9.3*  HCT 36.5 36.0 32.0* 31.3*  MCV 93.1  --   --  91.3  PLT 241  --   --  201   BMP &GFR Recent Labs  Lab 05/14/22 0507 05/14/22 0658 05/14/22 0904 05/15/22 0240  NA 134* 130* 131* 134*  K 5.9* 5.7* 5.3* 4.8  CL 94*  --   --  95*  CO2 24  --   --  32  GLUCOSE 156*  --   --  92  BUN 46*  --   --  49*  CREATININE 1.91*  --   --   1.93*  CALCIUM 8.9  --   --  8.4*   Estimated Creatinine Clearance: 23.2 mL/min (A) (by C-G formula based on SCr of 1.93 mg/dL (H)). Liver & Pancreas: Recent Labs  Lab 05/14/22 0507  AST 21  ALT 17  ALKPHOS 80  BILITOT 0.6  PROT 6.4*  ALBUMIN 3.5   Recent Labs  Lab 05/14/22 0507  LIPASE 34   No results for input(s): "AMMONIA" in the last 168 hours. Diabetic: Recent Labs    05/15/22 0240  HGBA1C 6.9*   Recent Labs  Lab 05/14/22 2051 05/15/22 0855 05/15/22 1221 05/15/22 1606  GLUCAP 134* 85 153* 142*   Cardiac Enzymes: No results for input(s): "CKTOTAL", "CKMB", "CKMBINDEX", "TROPONINI" in the last 168 hours. No results for input(s): "PROBNP" in the last 8760 hours. Coagulation Profile: No results for input(s): "INR", "PROTIME" in the last 168 hours. Thyroid Function Tests: No results for input(s): "TSH", "T4TOTAL", "FREET4", "T3FREE", "THYROIDAB" in the last 72 hours. Lipid Profile: No results for input(s): "CHOL", "HDL", "LDLCALC", "TRIG", "CHOLHDL", "LDLDIRECT" in the last 72 hours. Anemia Panel: No results for input(s): "VITAMINB12", "FOLATE", "FERRITIN", "TIBC", "IRON", "RETICCTPCT" in the last 72 hours. Urine analysis:    Component Value Date/Time   COLORURINE YELLOW 05/14/2022 1311   APPEARANCEUR HAZY (A) 05/14/2022 1311   APPEARANCEUR Clear 05/23/2020 1627   LABSPEC 1.009 05/14/2022 1311   PHURINE 5.0 05/14/2022 1311   GLUCOSEU NEGATIVE 05/14/2022 1311   GLUCOSEU NEGATIVE 01/22/2022 1549   HGBUR NEGATIVE 05/14/2022 1311   BILIRUBINUR NEGATIVE 05/14/2022 1311   BILIRUBINUR Negative 05/23/2020 1627   KETONESUR NEGATIVE 05/14/2022 1311   PROTEINUR NEGATIVE 05/14/2022 1311   UROBILINOGEN 0.2 01/22/2022 1549   NITRITE NEGATIVE 05/14/2022 1311   LEUKOCYTESUR NEGATIVE 05/14/2022 1311   Sepsis Labs: Invalid input(s): "PROCALCITONIN", "LACTICIDVEN"  Microbiology: Recent Results (from the past 240 hour(s))  Resp Panel by RT-PCR (Flu A&B, Covid)  Anterior Nasal Swab     Status: None   Collection Time: 05/14/22  6:23 AM   Specimen: Anterior Nasal Swab  Result Value Ref Range Status   SARS Coronavirus 2 by RT PCR NEGATIVE NEGATIVE Final    Comment: (NOTE) SARS-CoV-2 target nucleic acids are NOT DETECTED.  The SARS-CoV-2 RNA is generally detectable in upper respiratory specimens during  the acute phase of infection. The lowest concentration of SARS-CoV-2 viral copies this assay can detect is 138 copies/mL. A negative result does not preclude SARS-Cov-2 infection and should not be used as the sole basis for treatment or other patient management decisions. A negative result may occur with  improper specimen collection/handling, submission of specimen other than nasopharyngeal swab, presence of viral mutation(s) within the areas targeted by this assay, and inadequate number of viral copies(<138 copies/mL). A negative result must be combined with clinical observations, patient history, and epidemiological information. The expected result is Negative.  Fact Sheet for Patients:  EntrepreneurPulse.com.au  Fact Sheet for Healthcare Providers:  IncredibleEmployment.be  This test is no t yet approved or cleared by the [place] FDA and  has been authorized for detection and/or diagnosis of SARS-CoV-2 by FDA under an Emergency Use Authorization (EUA). This EUA will remain  in effect (meaning this test can be used) for the duration of the COVID-19 declaration under Section 564(b)(1) of the Act, 21 U.S.C.section 360bbb-3(b)(1), unless the authorization is terminated  or revoked sooner.       Influenza A by PCR NEGATIVE NEGATIVE Final   Influenza B by PCR NEGATIVE NEGATIVE Final    Comment: (NOTE) The Xpert Xpress SARS-CoV-2/FLU/RSV plus assay is intended as an aid in the diagnosis of influenza from Nasopharyngeal swab specimens and should not be used as a sole basis for treatment. Nasal washings  and aspirates are unacceptable for Xpert Xpress SARS-CoV-2/FLU/RSV testing.  Fact Sheet for Patients: EntrepreneurPulse.com.au  Fact Sheet for Healthcare Providers: IncredibleEmployment.be  This test is not yet approved or cleared by the [place] FDA and has been authorized for detection and/or diagnosis of SARS-CoV-2 by FDA under an Emergency Use Authorization (EUA). This EUA will remain in effect (meaning this test can be used) for the duration of the COVID-19 declaration under Section 564(b)(1) of the Act, 21 U.S.C. section 360bbb-3(b)(1), unless the authorization is terminated or revoked.  Performed at [facility], [place] [address], [place], [place] [address]   Culture, blood (routine x 2)     Status: None (Preliminary result)   Collection Time: 05/14/22  6:23 AM   Specimen: BLOOD  Result Value Ref Range Status   Specimen Description BLOOD RIGHT ANTECUBITAL  Final   Special Requests   Final    BOTTLES DRAWN AEROBIC AND ANAEROBIC Blood Culture results may not be optimal due to an excessive volume of blood received in culture bottles   Culture   Final    NO GROWTH 1 DAY Performed at [facility], [place] [address], [place], [place] [address]    Report Status PENDING  Incomplete  Culture, blood (routine x 2)     Status: None (Preliminary result)   Collection Time: 05/14/22  6:25 AM   Specimen: BLOOD  Result Value Ref Range Status   Specimen Description BLOOD LEFT ANTECUBITAL  Final   Special Requests   Final    BOTTLES DRAWN AEROBIC AND ANAEROBIC Blood Culture results may not be optimal due to an excessive volume of blood received in culture bottles   Culture   Final    NO GROWTH 1 DAY Performed at [facility], [place] [address], [place], [place] [address]    Report Status PENDING  Incomplete  Urine Culture     Status: None   Collection Time: 05/14/22  7:57 AM   Specimen: Urine, Clean Catch  Result Value Ref Range  Status   Specimen Description  URINE, CLEAN CATCH  Final   Special Requests NONE  Final   Culture   Final    NO GROWTH Performed at [facility], [place] [address] [place], [place] [address]    Report Status 05/15/2022 FINAL  Final    Radiology Studies: No results found.    Meng Winterton T. [place]  If 7PM-7AM, please contact night-coverage [email or web address] 05/15/2022, 6:02 PM

## 2022-05-15 NOTE — Progress Notes (Signed)
Initial Nutrition Assessment  DOCUMENTATION CODES:   Obesity unspecified  INTERVENTION:   Glucerna Shake po Daily, each supplement provides 220 kcal and 10 grams of protein Multivitamin w/ minerals daily Liberalize pt diet to 2 gm Sodium due to poor appetite and increased needs. Meal ordering with assist   NUTRITION DIAGNOSIS:   Increased nutrient needs related to chronic illness (COPD, CHF) as evidenced by estimated needs.  GOAL:   Patient will meet greater than or equal to 90% of their needs  MONITOR:   PO intake, Supplement acceptance, Labs, I & O's  REASON FOR ASSESSMENT:   Consult Other (Comment) (Nutritional Goals)  ASSESSMENT:   88 y.o. female presented to the ED with hypoxia. PMH includes CKD III, COPD, CHF, DM, and HTN. Pt admitted with CHF exacerbation and AMS.  Pt reports that her appetite has not been great for a little bit, but she makes sure that she eats. Reports that she lives with her son that does the cooking and that he makes sure that she eats. Reports that she has a lot of chicken at home. Shares that she does have difficulty chewing some foods due to missing teeth. Shares that she will drink Glucerna at home if she does not feel like having a meal; agreeable here as well.   Pt denies any recent weight loss, states that her weight has been stable. Per EMR,  pt has not had any weight lost within the past year. Reports that she uses a walker and wheelchair at home.  Medications reviewed and include: Colace, Lasix, NovoLog,Semglee Labs reviewed: Sodium 134, BUN 49, Creatinine 1.93, HGB A1c 6.9%, 24 hr CBG 85-134  NUTRITION - FOCUSED PHYSICAL EXAM:  Flowsheet Row Most Recent Value  Orbital Region No depletion  Upper Arm Region No depletion  Thoracic and Lumbar Region No depletion  Buccal Region No depletion  Temple Region No depletion  Clavicle Bone Region No depletion  Clavicle and Acromion Bone Region No depletion  Scapular Bone Region No  depletion  Dorsal Hand Mild depletion  Patellar Region No depletion  Anterior Thigh Region No depletion  Posterior Calf Region No depletion  Edema (RD Assessment) None  Hair Reviewed  Eyes Reviewed  Mouth Reviewed  Skin Reviewed  Nails Reviewed   Diet Order:   Diet Order             Diet heart healthy/carb modified Room service appropriate? No; Fluid consistency: Thin; Fluid restriction: 1500 mL Fluid  Diet effective ____                   EDUCATION NEEDS:   No education needs have been identified at this time  Skin:  Skin Assessment: Reviewed RN Assessment  Last BM:  Unknown  Height:   Ht Readings from Last 1 Encounters:  05/14/22 '5\' 4"'$  (1.626 m)    Weight:   Wt Readings from Last 1 Encounters:  05/15/22 100 kg    Ideal Body Weight:  54.6 kg  BMI:  Body mass index is 37.84 kg/m.  Estimated Nutritional Needs:   Kcal:  1700-1900  Protein:  85-100 grams  Fluid:  >/= 1.7 L    Hermina Barters RD, LDN Clinical Dietitian See [facility] for contact information.

## 2022-05-15 NOTE — Evaluation (Signed)
Occupational Therapy Evaluation Patient Details Name: [name] MRN: [id number] DOB: 14-Jan-1931 Today's Date: 05/15/2022   History of Present Illness Pt is an 88 year old woman admitted on 05/14/22 after falling out of bed, with hypoxia, weakness and dizziness. Recent UTI.PMH: CKD, COPD on 3L 02, CHF, pericardial effusion, HTN, anasarca, DM, morbid obesity.   Clinical Impression   Pt transfers with a RW and uses a manual w/c for mobility. She is modified independent with ADLs using AE and sitting to shower. She lives with her son. Pt presents with fear of falling, impaired sitting and standing balance and decreased activity tolerance. She is dependent on 12L 02. Pt requires set up to moderate assistance for ADLs and min assist to transfer bed to chair. Recommending HHOT upon discharge, pt in agreement.      Recommendations for follow up therapy are one component of a multi-disciplinary discharge planning process, led by the attending physician.  Recommendations may be updated based on patient status, additional functional criteria and insurance authorization.   Follow Up Recommendations  Home health OT    Assistance Recommended at Discharge Frequent or constant Supervision/Assistance  Patient can return home with the following A little help with walking and/or transfers;A lot of help with bathing/dressing/bathroom;Assistance with cooking/housework;Assist for transportation;Help with stairs or ramp for entrance    Functional Status Assessment  Patient has had a recent decline in their functional status and demonstrates the ability to make significant improvements in function in a reasonable and predictable amount of time.  Equipment Recommendations  None recommended by OT    Recommendations for Other Services       Precautions / Restrictions Precautions Precautions: Fall      Mobility Bed Mobility Overal bed mobility: Needs Assistance Bed Mobility: Supine to Sit     Supine to  sit: Min assist, HOB elevated     General bed mobility comments: assist with bed pad for hips to EOB    Transfers Overall transfer level: Needs assistance Equipment used: Rolling walker (2 wheels) Transfers: Sit to/from Stand, Bed to chair/wheelchair/BSC Sit to Stand: Min assist     Step pivot transfers: Min assist     General transfer comment: cues for hand placement, steadying assist, pt with posterior bias and fear of falling      Balance Overall balance assessment: Needs assistance Sitting-balance support: Feet supported, Bilateral upper extremity supported Sitting balance-Leahy Scale: Poor   Postural control: Posterior lean Standing balance support: Bilateral upper extremity supported Standing balance-Leahy Scale: Poor                             ADL either performed or assessed with clinical judgement   ADL Overall ADL's : Needs assistance/impaired Eating/Feeding: Independent;Bed level   Grooming: Set up;Sitting   Upper Body Bathing: Minimal assistance;Sitting   Lower Body Bathing: Moderate assistance;Sit to/from stand   Upper Body Dressing : Minimal assistance;Sitting   Lower Body Dressing: Moderate assistance;Sit to/from stand   Toilet Transfer: Minimal assistance;Stand-pivot;Rolling walker (2 wheels)                   Vision Baseline Vision/History: 1 Wears glasses Ability to See in Adequate Light: 0 Adequate Patient Visual Report: No change from baseline       Perception     Praxis      Pertinent Vitals/Pain Pain Assessment Pain Assessment: No/denies pain     Hand Dominance Right   Extremity/Trunk Assessment  Upper Extremity Assessment Upper Extremity Assessment: Overall WFL for tasks assessed   Lower Extremity Assessment Lower Extremity Assessment: Defer to PT evaluation   Cervical / Trunk Assessment Cervical / Trunk Assessment: Other exceptions (morbid obesity)   Communication Communication Communication: No  difficulties;Other (comment) (verbose)   Cognition Arousal/Alertness: Awake/alert Behavior During Therapy: Anxious Overall Cognitive Status: Within Functional Limits for tasks assessed                                 General Comments: fearful of falling, decreased attention     General Comments  Pt on 12L 02, 93% at rest with good waveform.    Exercises     Shoulder Instructions      Home Living Family/patient expects to be discharged to:: Private residence Living Arrangements: Children (son) Available Help at Discharge: Available 24 hours/day;Family Type of Home: House Home Access: Ramped entrance     Home Layout: Two level;Able to live on main level with bedroom/bathroom     Bathroom Shower/Tub: Hospital doctor Toilet: Handicapped height     Home Equipment: Wheelchair - manual;Grab bars - tub/shower;Rolling Environmental consultant (2 wheels);Hand held shower head;Adaptive equipment Adaptive Equipment: Reacher;Long-handled sponge;Other (Comment) (dressing stick)        Prior Functioning/Environment Prior Level of Function : Needs assist             Mobility Comments: transfers independently to manual w/c with RW ADLs Comments: sits to shower,uses AE for LB ADLs, son assists with IADLs        OT Problem List: Decreased strength;Decreased activity tolerance;Impaired balance (sitting and/or standing);Decreased cognition;Decreased safety awareness;Decreased knowledge of use of DME or AE;Obesity      OT Treatment/Interventions: Self-care/ADL training;DME and/or AE instruction;Energy conservation;Therapeutic activities;Patient/family education;Balance training    OT Goals(Current goals can be found in the care plan section) Acute Rehab OT Goals OT Goal Formulation: With patient Time For Goal Achievement: 05/29/22 Potential to Achieve Goals: Good  OT Frequency: Min 2X/week    Co-evaluation              AM-PAC OT "6 Clicks" Daily Activity      Outcome Measure Help from another person eating meals?: None Help from another person taking care of personal grooming?: A Little Help from another person toileting, which includes using toliet, bedpan, or urinal?: A Lot Help from another person bathing (including washing, rinsing, drying)?: A Lot Help from another person to put on and taking off regular upper body clothing?: A Little Help from another person to put on and taking off regular lower body clothing?: A Lot 6 Click Score: 16   End of Session Equipment Utilized During Treatment: Gait belt;Rolling walker (2 wheels);Oxygen Nurse Communication: Mobility status  Activity Tolerance: Patient tolerated treatment well Patient left: in chair;with call bell/phone within reach;with chair alarm set  OT Visit Diagnosis: Unsteadiness on feet (R26.81);Other abnormalities of gait and mobility (R26.89);Muscle weakness (generalized) (M62.81);Other (comment) (decreased activity tolerance)                Time: 1914-7829 OT Time Calculation (min): 23 min Charges:  OT General Charges $OT Visit: 1 Visit OT Evaluation $OT Eval Moderate Complexity: [place], OTR/L Acute Rehabilitation Services Office: [phone]  [name] 05/15/2022, 9:44 AM

## 2022-05-16 ENCOUNTER — Inpatient Hospital Stay (HOSPITAL_COMMUNITY): Payer: Medicare Other

## 2022-05-16 DIAGNOSIS — G9341 Metabolic encephalopathy: Secondary | ICD-10-CM | POA: Diagnosis not present

## 2022-05-16 DIAGNOSIS — I5033 Acute on chronic diastolic (congestive) heart failure: Secondary | ICD-10-CM | POA: Diagnosis not present

## 2022-05-16 DIAGNOSIS — N179 Acute kidney failure, unspecified: Secondary | ICD-10-CM | POA: Diagnosis not present

## 2022-05-16 DIAGNOSIS — I82403 Acute embolism and thrombosis of unspecified deep veins of lower extremity, bilateral: Secondary | ICD-10-CM

## 2022-05-16 DIAGNOSIS — J9621 Acute and chronic respiratory failure with hypoxia: Secondary | ICD-10-CM | POA: Diagnosis not present

## 2022-05-16 LAB — RENAL FUNCTION PANEL
Albumin: 2.9 g/dL — ABNORMAL LOW (ref 3.5–5.0)
Anion gap: 7 (ref 5–15)
BUN: 47 mg/dL — ABNORMAL HIGH (ref 8–23)
CO2: 33 mmol/L — ABNORMAL HIGH (ref 22–32)
Calcium: 8.6 mg/dL — ABNORMAL LOW (ref 8.9–10.3)
Chloride: 97 mmol/L — ABNORMAL LOW (ref 98–111)
Creatinine, Ser: 1.68 mg/dL — ABNORMAL HIGH (ref 0.44–1.00)
GFR, Estimated: 29 mL/min — ABNORMAL LOW (ref >60)
Glucose, Bld: 145 mg/dL — ABNORMAL HIGH (ref 70–99)
Phosphorus: 3.1 mg/dL (ref 2.5–4.6)
Potassium: 4.7 mmol/L (ref 3.5–5.1)
Sodium: 137 mmol/L (ref 135–145)

## 2022-05-16 LAB — RETICULOCYTES
Immature Retic Fract: 28.8 % — ABNORMAL HIGH (ref 2.3–15.9)
RBC.: 3.29 MIL/uL — ABNORMAL LOW (ref 3.87–5.11)
Retic Count, Absolute: 99 10*3/uL (ref 19.0–186.0)
Retic Ct Pct: 3 % (ref 0.4–3.1)

## 2022-05-16 LAB — TSH: TSH: 0.918 u[IU]/mL (ref 0.350–4.500)

## 2022-05-16 LAB — GLUCOSE, CAPILLARY
Glucose-Capillary: 90 mg/dL (ref 70–99)
Glucose-Capillary: 212 mg/dL — ABNORMAL HIGH (ref 70–99)
Glucose-Capillary: 247 mg/dL — ABNORMAL HIGH (ref 70–99)
Glucose-Capillary: 290 mg/dL — ABNORMAL HIGH (ref 70–99)

## 2022-05-16 LAB — IRON AND TIBC
Iron: 24 ug/dL — ABNORMAL LOW (ref 28–170)
Saturation Ratios: 10 % — ABNORMAL LOW (ref 10.4–31.8)
TIBC: 242 ug/dL — ABNORMAL LOW (ref 250–450)
UIBC: 218 ug/dL

## 2022-05-16 LAB — VITAMIN B12: Vitamin B-12: 1001 pg/mL — ABNORMAL HIGH (ref 180–914)

## 2022-05-16 LAB — FOLATE: Folate: >40 ng/mL (ref >5.9)

## 2022-05-16 LAB — MAGNESIUM: Magnesium: 2.4 mg/dL (ref 1.7–2.4)

## 2022-05-16 LAB — BRAIN NATRIURETIC PEPTIDE: B Natriuretic Peptide: 384.6 pg/mL — ABNORMAL HIGH (ref 0.0–100.0)

## 2022-05-16 LAB — CK: Total CK: 57 U/L (ref 38–234)

## 2022-05-16 LAB — FERRITIN: Ferritin: 103 ng/mL (ref 11–307)

## 2022-05-16 MED ORDER — INSULIN GLARGINE-YFGN 100 UNIT/ML ~~LOC~~ SOLN
15.0000 [IU] | Freq: Two times a day (BID) | SUBCUTANEOUS | Status: DC
  Administered 2022-05-16 – 2022-05-18 (×4): 15 [IU] via SUBCUTANEOUS
  Filled 2022-05-16 (×6): qty 0.15

## 2022-05-16 MED ORDER — METOPROLOL TARTRATE 12.5 MG HALF TABLET
12.5000 mg | ORAL_TABLET | Freq: Two times a day (BID) | ORAL | Status: DC
  Administered 2022-05-16 – 2022-05-21 (×10): 12.5 mg via ORAL
  Filled 2022-05-16 (×10): qty 1

## 2022-05-16 NOTE — Progress Notes (Addendum)
Heart Failure Navigator Progress Note  Assessed for Heart & Vascular TOC clinic readiness.  Patient does not meet criteria due to EF 60-65 % . Chronic edema.  Navigator available for reassessment of patient.   [name], BSN, Clinical cytogeneticist Only

## 2022-05-16 NOTE — TOC Initial Note (Signed)
Transition of Care [facility]) - Initial/Assessment Note    Patient Details  Name: [name] MRN: [id number] Date of Birth: 24-May-1931  Transition of Care [facility]) CM/SW Contact:    Ninfa Meeker, RN Phone Number: 05/16/2022, 1:53 PM  Clinical Narrative:  Case manager spoke with patient concerning discharge needs. 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 CM contacted [name] and discussed Home Health therapy needs. Discussed agency options. [name] deferred to Case Manager to arrange [place] with reputable agency. Referral called to [facility]. [name] expressed concerns that they don't have adequate oxygen supply at home and that they need more for portable tank as well. Case Manager contacted Bethanne Ginger, representative with [employer] and asked that he contact [name]. CM also requested that we get saturations documented should we need new oxygen orders for discharge. Per MD patient is not medically ready. TOC Team will continue to follow.   Expected Discharge Plan: [place] Barriers to Discharge: Continued Medical Work up   Patient Goals and CMS Choice     Choice offered to / list presented to : Adult Children  Expected Discharge Plan and Services Expected Discharge Plan: [place]   Discharge Planning Services: CM Consult Post Acute Care Choice: [place] arrangements for the past 2 months: Single Family Home                           HH Arranged: PT, OT, Nurse's Aide HH Agency: [place] Date [facility] Agency Contacted: 05/16/22 Time HH Agency Contacted: [phone] Representative spoke with at [place]: Adela Lank  Prior Living Arrangements/Services Living arrangements for the past 2 months: [place] Lives with:: Adult Children Patient language and need for interpreter reviewed:: Yes Do you feel safe going back to  the place where you live?: Yes      Need for Family Participation in Patient Care: Yes (Comment) Care giver support system in place?: Yes (comment)   Criminal Activity/Legal Involvement Pertinent to Current Situation/Hospitalization: No - Comment as needed  Activities of Daily Living Home Assistive Devices/Equipment: Wheelchair ADL Screening (condition at time of admission) Patient's cognitive ability adequate to safely complete daily activities?: Yes Is the patient deaf or have difficulty hearing?: No Does the patient have difficulty seeing, even when wearing glasses/contacts?: No Does the patient have difficulty concentrating, remembering, or making decisions?: No Patient able to express need for assistance with ADLs?: Yes Does the patient have difficulty dressing or bathing?: Yes Independently performs ADLs?: No Communication: Independent Dressing (OT): Needs assistance Is this a change from baseline?: Pre-admission baseline Grooming: Needs assistance Is this a change from baseline?: Pre-admission baseline Feeding: Independent Bathing: Needs assistance Is this a change from baseline?: Pre-admission baseline Toileting: Needs assistance Is this a change from baseline?: Pre-admission baseline In/Out Bed: Needs assistance Is this a change from baseline?: Pre-admission baseline Walks in Home: Dependent Is this a change from baseline?: Pre-admission baseline Does the patient have difficulty walking or climbing stairs?: Yes Weakness of Legs: Both Weakness of Arms/Hands: None  Permission Sought/Granted                  Emotional Assessment   Attitude/Demeanor/Rapport: Gracious   Orientation: : Oriented to Self, Oriented to Place, Oriented to Situation, Oriented to  Time Alcohol / Substance Use: Not Applicable Psych Involvement: No (  comment)  Admission diagnosis:  Acute on chronic respiratory failure with hypoxia (HCC) [J96.21] Acute on chronic respiratory failure with  hypoxia and hypercapnia (HCC) [F68.12, J96.22] Patient Active Problem List   Diagnosis Date Noted   Hyponatremia 05/15/2022   Normocytic anemia 05/15/2022   Acute on chronic respiratory failure with hypoxia and hypercapnia ([place]) 05/14/2022   DNR (do not resuscitate) 05/14/2022   Dysuria 02/15/2021   Suspected sleep apnea 11/08/2020   Hearing loss of left ear due to cerumen impaction 09/22/2020   Osteoarthritis 09/22/2020   Gout 03/31/2020   Morbid obesity ([place]) 09/21/2019   Acute kidney injury superimposed on chronic kidney disease ([place]) 75/17/0017   Acute metabolic encephalopathy 49/44/9675   Anemia of chronic renal failure, stage 2 (mild) 02/18/2017   Diabetic polyneuropathy associated with type 2 diabetes mellitus ([place]) 02/18/2017   Vitamin D deficiency 02/18/2017   COPD (chronic obstructive pulmonary disease) ([place])    Acute on chronic diastolic CHF (congestive heart failure) ([place])    Pulmonary hypertension (HCC)    CKD (chronic kidney disease) stage 3, GFR 30-59 ml/min (HCC)    Hypertensive retinopathy of both eyes 09/26/2015   Pseudophakia of both eyes 09/26/2015   Diabetes with neurologic complications ([place]) 91/63/8466   Mixed dyslipidemia 03/18/2011   Hypertension, essential    PCP:  Hoyt Koch, MD Pharmacy:   [facility] [place], [place] - [address] [place] [address] Phone: [phone] Fax: [phone]     Social Determinants of Health (SDOH) Interventions    Readmission Risk Interventions    11/07/2020    3:49 PM  Readmission Risk Prevention Plan  Transportation Screening Complete  PCP or Specialist Appt within 3-5 Days Complete  HRI or Home Care Consult Patient refused  Social Work Consult for [place] Planning/Counseling Complete  Palliative Care Screening Not Applicable

## 2022-05-16 NOTE — Progress Notes (Signed)
PROGRESS NOTE  [name]:[id number] DOB: 09-20-1931   PCP: Hoyt Koch, MD  Patient is from: Home.  DOA: 05/14/2022 LOS: 2  Chief complaints Chief Complaint  Patient presents with   Shortness of Breath     Brief Narrative / Interim history: 88 year old F with PMH of chronic diastolic CHF, COPD, chronic hypoxic RF on 3 L, recent "UTI", CKD-3, CAD, DM-2, morbid obesity, HTN and HLD presenting with confusion and hypoxia to 65% on home 3 L.  Family reports recent diagnosis of "UTI" and she increased her Macrobid to twice daily for 7 days.  However, she had no UTI symptoms on presentation.  In ED, 100% on NRB.  Other vitals normal.  WBC 13.1 with left shift.  Hgb 10.8.  580.  Troponin 12> 17 lactic acid 0.7.  NA 134.  K5.9. Cr 1.91.  BUN 46.  AG 16.  Bicarb 24.  COVID-19 and influenza PCR nonreactive.  Portable CXR with acute pulmonary edema with trace pleural effusion.  VBG 7.23/87/172/37.  UA and cultures drawn.  TTE ordered.  Patient was started on BiPAP and IV Lasix.  Admitted for acute on chronic respiratory failure with hypoxia and hypercapnia in the setting of acute on chronic diastolic CHF, and acute metabolic encephalopathy and AKI.  The next day, patient's respiratory status, blood gas and mental status.  Urine and blood cultures NGTD.  Remained on IV Lasix for further diuresis.  Subjective: Seen and examined earlier this morning.  No major events overnight of this morning.  Reports improvement in her breathing and swelling.  Denies GI or UTI symptoms.  Net -2.6 L so far.  Creatinine is stable.  Still on 5 L by nasal cannula.  Objective: Vitals:   05/16/22 0754 05/16/22 0800 05/16/22 1200 05/16/22 1600  BP:  (!) 135/103 (!) 157/48 (!) 143/89  Pulse:  85 88 85  Resp:  '17 16 17  '$ Temp:   98 F (36.7 C)   TempSrc:   Oral   SpO2: 92% 95% 94% 91%  Weight:      Height:        Examination:  GENERAL: No apparent distress.  Nontoxic. HEENT: MMM.  Vision and  hearing grossly intact.  NECK: Supple.  No apparent JVD.  RESP: 92% on 5 L at rest.  No IWOB.  Fair aeration bilaterally.  Bibasilar crackles. CVS:  RRR. Heart sounds normal.  ABD/GI/GU: BS+. Abd soft, NTND.  MSK/EXT:  Moves extremities. No apparent deformity.  1+ BLE edema. SKIN: no apparent skin lesion or wound NEURO: Awake and alert. Oriented appropriately.  No apparent focal neuro deficit. PSYCH: Calm. Normal affect.   Procedures:  None  Microbiology summarized: NFAOZ-30 and influenza PCR nonreactive. Urine culture NGTD Blood culture NGTD  Assessment and plan: Principal Problem:   Acute on chronic respiratory failure with hypoxia and hypercapnia (HCC) Active Problems:   Hypertension, essential   Mixed dyslipidemia   Diabetes with neurologic complications (HCC)   COPD (chronic obstructive pulmonary disease) (HCC)   Acute on chronic diastolic CHF (congestive heart failure) (HCC)   Acute kidney injury superimposed on chronic kidney disease ([place])   Acute metabolic encephalopathy   Morbid obesity ([place])   Suspected sleep apnea   DNR (do not resuscitate)   Hyponatremia   Normocytic anemia  Acute on chronic respiratory failure with hypoxia and hypercapnia: Reportedly desaturated to 65% on home 3 L at home.  Elevated BNP and CXR suggests acute on chronic diastolic CHF. VBG 7.23/87/172/37 suggests obstructive  etiology but she does not seem to be in COPD exacerbation on exam.  Respiratory symptoms, oxygen requirement, blood gas improved. -Continue weaning oxygen as able.  -Minimum oxygen to keep saturation above 88%.  Patient is at risk for CO2 retention. -BiPAP at night and when she naps. -She would benefit from outpatient sleep study to rule out sleep apnea  Acute on chronic diastolic CHF: TTE with normal LVEF but G2 DD.  BNP elevated to 580.  CXR with pulmonary edema. BLE edema and BNP improved.  Net -2.6 L so far.  Creatinine improved.  Still requiring 5 L by nasal cannula.   Bibasilar crackles and 1+ BLE edema on exam.  Venous Doppler negative for DVT. -Continue IV diuretics  -Monitor intake and output, daily weight, renal functions and electrolytes -BiPAP as needed and at night  Acute metabolic encephalopathy: Likely due to hypercapnia.  She is also on opiates, low-dose gabapentin and Ativan.  Resolved.  No focal neurodeficit. -Manage respiratory failure as above -Minimize sedating medications -Reorientation and delirium precautions  AKI/azotemia on CKD-3B: Baseline creatinine seems to be about 1.5. Recent Labs    11/12/21 1355 01/22/22 1549 05/14/22 0507 05/15/22 0240 05/16/22 0045  BUN 44* 38* 46* 49* 47*  CREATININE 1.61* 1.47* 1.91* 1.93* 1.68*  -Continue monitoring while on diuretics.  Chronic COPD -Continue Breo Ellipta. -Continue DuoNeb twice daily and as needed albuterol -Minimum oxygen to keep saturation above 88%   H/o UTI-no UTI symptoms.  UA and urine culture negative here.  Was on Macrobid twice daily outpatient -Hold antibiotics.  Controlled IDDM-2 with CKD-3B, HLD and hyperglycemia: A1c 6.9%. Recent Labs  Lab 05/15/22 1606 05/15/22 2039 05/16/22 0740 05/16/22 1200 05/16/22 1625  GLUCAP 142* 226* 90 212* 247*  -Increase Semglee from 15 units at night to 15 units twice daily -Continue SSI-sensitive -Further adjustment as appropriate -Continue Lipitor.  Hyponatremia: Likely hypervolemic.  Resolved.  Normocytic anemia: Hgb slightly lower.  No evidence of bleeding. Recent Labs    01/22/22 1549 05/14/22 0507 05/14/22 0658 05/14/22 0904 05/15/22 0240  HGB 11.1* 10.8* 12.2 10.9* 9.3*  -Continue monitoring  Essential hypertension: BP slightly elevated but improved. -Continue holding Norvasc and hydralazine. -Continue home metoprolol at reduced dose   Gout -Continue allopurinol  Physical deconditioning -PT/OT eval   Goal of care counseling/DNR  Morbid obesity: Elevated BMI with comorbid conditions as above. Body  mass index is 37.2 kg/m. -Encourage lifestyle change to lose weight   Nutrition Problem: Increased nutrient needs Etiology: chronic illness (COPD, CHF) Signs/Symptoms: estimated needs Interventions: MVI, Glucerna shake, Liberalize Diet   DVT prophylaxis:  enoxaparin (LOVENOX) injection 30 mg Start: 05/14/22 1230  Code Status: DNR/DNI Family Communication: None at bedside Level of care: Progressive Status is: Inpatient Remains inpatient appropriate because: Acute on chronic respiratory failure with hypoxia and hypercapnia and acute on chronic diastolic CHF, AKI   Final disposition: TBD Consultants:  None  Sch Meds:  Scheduled Meds:  allopurinol  100 mg Oral Daily   aspirin EC  81 mg Oral q morning   atorvastatin  40 mg Oral q morning   docusate sodium  100 mg Oral BID   enoxaparin (LOVENOX) injection  30 mg Subcutaneous Q24H   feeding supplement (GLUCERNA SHAKE)  237 mL Oral Q24H   fluticasone  2 spray Each Nare Daily   fluticasone furoate-vilanterol  1 puff Inhalation Daily   furosemide  40 mg Intravenous BID   gabapentin  100 mg Oral BID   insulin aspart  0-5 Units  Subcutaneous QHS   insulin aspart  0-9 Units Subcutaneous TID WC   insulin glargine-yfgn  15 Units Subcutaneous BID   ipratropium-albuterol  3 mL Nebulization BID   metoprolol tartrate  12.5 mg Oral BID   multivitamin with minerals  1 tablet Oral Daily   sodium chloride flush  3 mL Intravenous Q12H   Continuous Infusions: PRN Meds:.acetaminophen **OR** acetaminophen, albuterol, ALPRAZolam, bisacodyl, hydrALAZINE, ondansetron **OR** ondansetron (ZOFRAN) IV, polyethylene glycol, traZODone  Antimicrobials: Anti-infectives (From admission, onward)    None        I have personally reviewed the following labs and images: CBC: Recent Labs  Lab 05/14/22 0507 05/14/22 0658 05/14/22 0904 05/15/22 0240  WBC 13.1*  --   --  9.6  NEUTROABS 10.8*  --   --   --   HGB 10.8* 12.2 10.9* 9.3*  HCT 36.5 36.0  32.0* 31.3*  MCV 93.1  --   --  91.3  PLT 241  --   --  201   BMP &GFR Recent Labs  Lab 05/14/22 0507 05/14/22 0658 05/14/22 0904 05/15/22 0240 05/16/22 0045  NA 134* 130* 131* 134* 137  K 5.9* 5.7* 5.3* 4.8 4.7  CL 94*  --   --  95* 97*  CO2 24  --   --  32 33*  GLUCOSE 156*  --   --  92 145*  BUN 46*  --   --  49* 47*  CREATININE 1.91*  --   --  1.93* 1.68*  CALCIUM 8.9  --   --  8.4* 8.6*  MG  --   --   --   --  2.4  PHOS  --   --   --   --  3.1   Estimated Creatinine Clearance: 26.3 mL/min (A) (by C-G formula based on SCr of 1.68 mg/dL (H)). Liver & Pancreas: Recent Labs  Lab 05/14/22 0507 05/16/22 0045  AST 21  --   ALT 17  --   ALKPHOS 80  --   BILITOT 0.6  --   PROT 6.4*  --   ALBUMIN 3.5 2.9*   Recent Labs  Lab 05/14/22 0507  LIPASE 34   No results for input(s): "AMMONIA" in the last 168 hours. Diabetic: Recent Labs    05/15/22 0240  HGBA1C 6.9*   Recent Labs  Lab 05/15/22 1606 05/15/22 2039 05/16/22 0740 05/16/22 1200 05/16/22 1625  GLUCAP 142* 226* 90 212* 247*   Cardiac Enzymes: Recent Labs  Lab 05/16/22 0045  CKTOTAL 57   No results for input(s): "PROBNP" in the last 8760 hours. Coagulation Profile: No results for input(s): "INR", "PROTIME" in the last 168 hours. Thyroid Function Tests: Recent Labs    05/16/22 0045  TSH 0.918   Lipid Profile: No results for input(s): "CHOL", "HDL", "LDLCALC", "TRIG", "CHOLHDL", "LDLDIRECT" in the last 72 hours. Anemia Panel: Recent Labs    05/16/22 0045  VITAMINB12 1,001*  FOLATE >40.0  FERRITIN 103  TIBC 242*  IRON 24*  RETICCTPCT 3.0   Urine analysis:    Component Value Date/Time   COLORURINE YELLOW 05/14/2022 1311   APPEARANCEUR HAZY (A) 05/14/2022 1311   APPEARANCEUR Clear 05/23/2020 1627   LABSPEC 1.009 05/14/2022 1311   PHURINE 5.0 05/14/2022 1311   GLUCOSEU NEGATIVE 05/14/2022 1311   GLUCOSEU NEGATIVE 01/22/2022 1549   HGBUR NEGATIVE 05/14/2022 1311   BILIRUBINUR  NEGATIVE 05/14/2022 1311   BILIRUBINUR Negative 05/23/2020 1627   KETONESUR NEGATIVE 05/14/2022 1311   PROTEINUR NEGATIVE 05/14/2022  1311   UROBILINOGEN 0.2 01/22/2022 1549   NITRITE NEGATIVE 05/14/2022 1311   LEUKOCYTESUR NEGATIVE 05/14/2022 1311   Sepsis Labs: Invalid input(s): "PROCALCITONIN", "LACTICIDVEN"  Microbiology: Recent Results (from the past 240 hour(s))  Resp Panel by RT-PCR (Flu A&B, Covid) Anterior Nasal Swab     Status: None   Collection Time: 05/14/22  6:23 AM   Specimen: Anterior Nasal Swab  Result Value Ref Range Status   SARS Coronavirus 2 by RT PCR NEGATIVE NEGATIVE Final    Comment: (NOTE) SARS-CoV-2 target nucleic acids are NOT DETECTED.  The SARS-CoV-2 RNA is generally detectable in upper respiratory specimens during the acute phase of infection. The lowest concentration of SARS-CoV-2 viral copies this assay can detect is 138 copies/mL. A negative result does not preclude SARS-Cov-2 infection and should not be used as the sole basis for treatment or other patient management decisions. A negative result may occur with  improper specimen collection/handling, submission of specimen other than nasopharyngeal swab, presence of viral mutation(s) within the areas targeted by this assay, and inadequate number of viral copies(<138 copies/mL). A negative result must be combined with clinical observations, patient history, and epidemiological information. The expected result is Negative.  Fact Sheet for Patients:  EntrepreneurPulse.com.au  Fact Sheet for Healthcare Providers:  IncredibleEmployment.be  This test is no t yet approved or cleared by the [place] FDA and  has been authorized for detection and/or diagnosis of SARS-CoV-2 by FDA under an Emergency Use Authorization (EUA). This EUA will remain  in effect (meaning this test can be used) for the duration of the COVID-19 declaration under Section 564(b)(1) of the  Act, 21 U.S.C.section 360bbb-3(b)(1), unless the authorization is terminated  or revoked sooner.       Influenza A by PCR NEGATIVE NEGATIVE Final   Influenza B by PCR NEGATIVE NEGATIVE Final    Comment: (NOTE) The Xpert Xpress SARS-CoV-2/FLU/RSV plus assay is intended as an aid in the diagnosis of influenza from Nasopharyngeal swab specimens and should not be used as a sole basis for treatment. Nasal washings and aspirates are unacceptable for Xpert Xpress SARS-CoV-2/FLU/RSV testing.  Fact Sheet for Patients: EntrepreneurPulse.com.au  Fact Sheet for Healthcare Providers: IncredibleEmployment.be  This test is not yet approved or cleared by the [place] FDA and has been authorized for detection and/or diagnosis of SARS-CoV-2 by FDA under an Emergency Use Authorization (EUA). This EUA will remain in effect (meaning this test can be used) for the duration of the COVID-19 declaration under Section 564(b)(1) of the Act, 21 U.S.C. section 360bbb-3(b)(1), unless the authorization is terminated or revoked.  Performed at [facility], [place] [address], [place], [place] [address]   Culture, blood (routine x 2)     Status: None (Preliminary result)   Collection Time: 05/14/22  6:23 AM   Specimen: BLOOD  Result Value Ref Range Status   Specimen Description BLOOD RIGHT ANTECUBITAL  Final   Special Requests   Final    BOTTLES DRAWN AEROBIC AND ANAEROBIC Blood Culture results may not be optimal due to an excessive volume of blood received in culture bottles   Culture   Final    NO GROWTH 2 DAYS Performed at [facility], [place] [address], [place], [place] [address]    Report Status PENDING  Incomplete  Culture, blood (routine x 2)     Status: None (Preliminary result)   Collection Time: 05/14/22  6:25 AM   Specimen: BLOOD  Result Value Ref Range Status  Specimen Description BLOOD LEFT ANTECUBITAL  Final   Special Requests   Final     BOTTLES DRAWN AEROBIC AND ANAEROBIC Blood Culture results may not be optimal due to an excessive volume of blood received in culture bottles   Culture   Final    NO GROWTH 2 DAYS Performed at [facility], [place] [address], [place], [place] [address]    Report Status PENDING  Incomplete  Urine Culture     Status: None   Collection Time: 05/14/22  7:57 AM   Specimen: Urine, Clean Catch  Result Value Ref Range Status   Specimen Description URINE, CLEAN CATCH  Final   Special Requests NONE  Final   Culture   Final    NO GROWTH Performed at [facility], [place] [address] [place], [place] [address]    Report Status 05/15/2022 FINAL  Final    Radiology Studies: VAS [place] LOWER EXTREMITY VENOUS (DVT)  Result Date: 05/16/2022  Lower Venous DVT Study Patient Name:  [name]  Date of Exam:   05/16/2022 Medical Rec #: [id number]     Accession #:    [id number] Date of Birth: 03-09-1931      Patient Gender: F Patient Age:   68 years Exam Location:  [facility] Procedure:      VAS [place] LOWER EXTREMITY VENOUS (DVT) Referring Phys: Bretta Bang Austin Herd --------------------------------------------------------------------------------  Comparison Study: 12/15/20, negative for DVT. Performing Technologist: Bobetta Lime BS, RVT  Examination Guidelines: A complete evaluation includes B-mode imaging, spectral Doppler, color Doppler, and power Doppler as needed of all accessible portions of each vessel. Bilateral testing is considered an integral part of a complete examination. Limited examinations for reoccurring indications may be performed as noted. The reflux portion of the exam is performed with the patient in reverse Trendelenburg.  +---------+---------------+---------+-----------+----------+--------------+ RIGHT    CompressibilityPhasicitySpontaneityPropertiesThrombus Aging +---------+---------------+---------+-----------+----------+--------------+ CFV      Full           Yes      Yes                                  +---------+---------------+---------+-----------+----------+--------------+ SFJ      Full                                                        +---------+---------------+---------+-----------+----------+--------------+ FV Prox  Full                                                        +---------+---------------+---------+-----------+----------+--------------+ FV Mid   Full                                                        +---------+---------------+---------+-----------+----------+--------------+ FV DistalFull                                                        +---------+---------------+---------+-----------+----------+--------------+  PFV      Full                                                        +---------+---------------+---------+-----------+----------+--------------+ POP      Full           Yes      Yes                                 +---------+---------------+---------+-----------+----------+--------------+ PTV      Full                                                        +---------+---------------+---------+-----------+----------+--------------+ PERO     Full                                                        +---------+---------------+---------+-----------+----------+--------------+   +---------+---------------+---------+-----------+----------+--------------+ LEFT     CompressibilityPhasicitySpontaneityPropertiesThrombus Aging +---------+---------------+---------+-----------+----------+--------------+ CFV      Full           Yes      Yes                                 +---------+---------------+---------+-----------+----------+--------------+ SFJ      Full                                                        +---------+---------------+---------+-----------+----------+--------------+ FV Prox  Full                                                         +---------+---------------+---------+-----------+----------+--------------+ FV Mid   Full                                                        +---------+---------------+---------+-----------+----------+--------------+ FV DistalFull                                                        +---------+---------------+---------+-----------+----------+--------------+ PFV      Full                                                        +---------+---------------+---------+-----------+----------+--------------+  POP      Full           Yes      Yes                                 +---------+---------------+---------+-----------+----------+--------------+ PTV      Full                                                        +---------+---------------+---------+-----------+----------+--------------+ PERO     Full                                                        +---------+---------------+---------+-----------+----------+--------------+    Summary: BILATERAL: - No evidence of deep vein thrombosis seen in the lower extremities, bilaterally. -No evidence of popliteal cyst, bilaterally.   *See table(s) above for measurements and observations.    Preliminary       Taneka Espiritu T. Brielle  If 7PM-7AM, please contact night-coverage [email or web address] 05/16/2022, 4:43 PM

## 2022-05-16 NOTE — Progress Notes (Signed)
Physical Therapy Treatment Patient Details Name: [name] MRN: [id number] DOB: 01/11/1931 Today's Date: 05/16/2022   History of Present Illness Pt is an 88 year old woman admitted on 05/14/22 after falling out of bed, with hypoxia, weakness and dizziness. Recent UTI.PMH: CKD, COPD on 3L 02, CHF, pericardial effusion, HTN, anasarca, DM, morbid obesity.    PT Comments    Pt calling out requesting to get to [facility]. Pt bed is saturated in urine. Pt limited in safe mobility by increased posterior lean and fear of falling, and increased O2 demand in presence of decreased strength and balance. Pt is modA for bed mobility, and min A for transfers. Unable to progress mobility due to 4/4 DoE and O2 desaturation with transfers. D/c plan remains appropriate at this time. PT will continue to follow acutely.    Recommendations for follow up therapy are one component of a multi-disciplinary discharge planning process, led by the attending physician.  Recommendations may be updated based on patient status, additional functional criteria and insurance authorization.  Follow Up Recommendations  Home health PT     Assistance Recommended at Discharge Frequent or constant Supervision/Assistance  Patient can return home with the following A little help with walking and/or transfers;A little help with bathing/dressing/bathroom;Assistance with cooking/housework;Direct supervision/assist for medications management;Assist for transportation;Help with stairs or ramp for entrance   Equipment Recommendations  None recommended by PT       Precautions / Restrictions Precautions Precautions: Fall Precaution Comments: monitor SpO2 on 12L HFNC Restrictions Weight Bearing Restrictions: No     Mobility  Bed Mobility Overal bed mobility: Needs Assistance Bed Mobility: Supine to Sit     Supine to sit: Min assist, HOB elevated, Mod assist     General bed mobility comments: able to come to seated, min A for  posterior support  but requires modA for pad scoot of hips to EoB,    Transfers Overall transfer level: Needs assistance Equipment used: Rolling walker (2 wheels) Transfers: Sit to/from Stand, Bed to chair/wheelchair/BSC Sit to Stand: Min assist   Step pivot transfers: Min assist       General transfer comment: cues for hand placement, steadying assist, pt with posterior bias and fear of falling,    Ambulation/Gait               General Gait Details: uses wheelchair most of the time, wishes she could walk around her room at home      Balance Overall balance assessment: Needs assistance Sitting-balance support: Feet supported, Bilateral upper extremity supported Sitting balance-Leahy Scale: Poor Sitting balance - Comments: posterior lean, cannot release UE supports Postural control: Posterior lean Standing balance support: Bilateral upper extremity supported Standing balance-Leahy Scale: Poor Standing balance comment: posterior lean                            Cognition Arousal/Alertness: Awake/alert Behavior During Therapy: Anxious Overall Cognitive Status: Within Functional Limits for tasks assessed                                 General Comments: fearful of falling, decreased attention        Exercises Other Exercises Other Exercises: 3 STS    General Comments General comments (skin integrity, edema, etc.): Pt on 5L HFNC, drops to 85%O2 with mobilization requires seated rest break and cuing for purse lip breathing, HR max 115 bpm  with mobilization.      Pertinent Vitals/Pain Pain Assessment Pain Assessment: No/denies pain     PT Goals (current goals can now be found in the care plan section) Acute Rehab PT Goals Patient Stated Goal: "home" PT Goal Formulation: With patient Time For Goal Achievement: 05/29/22 Potential to Achieve Goals: Good Progress towards PT goals: Progressing toward goals    Frequency    Min  3X/week      PT Plan Current plan remains appropriate       AM-PAC PT "6 Clicks" Mobility   Outcome Measure  Help needed turning from your back to your side while in a flat bed without using bedrails?: A Little Help needed moving from lying on your back to sitting on the side of a flat bed without using bedrails?: A Little Help needed moving to and from a bed to a chair (including a wheelchair)?: A Little Help needed standing up from a chair using your arms (e.g., wheelchair or bedside chair)?: A Little Help needed to walk in hospital room?: A Little Help needed climbing 3-5 steps with a railing? : Total 6 Click Score: 16    End of Session Equipment Utilized During Treatment: Oxygen (5L HFNC) Activity Tolerance: Patient tolerated treatment well Patient left: in chair;with call bell/phone within reach;with chair alarm set Nurse Communication: Mobility status PT Visit Diagnosis: Other abnormalities of gait and mobility (R26.89)     Time: 3267-1245 PT Time Calculation (min) (ACUTE ONLY): 38 min  Charges:  $Therapeutic Exercise: 8-22 mins $Therapeutic Activity: 23-37 mins                     Khadeem Rockett B. Migdalia Dk PT, DPT Acute Rehabilitation Services Please use secure chat or  Call Office [phone]    [place] 05/16/2022, 1:08 PM

## 2022-05-16 NOTE — Progress Notes (Signed)
Bilateral LE venous duplex study completed. Please see CV Proc for preliminary results.  Sesar Madewell BS, RVT 05/16/2022 12:10 PM

## 2022-05-17 DIAGNOSIS — G9341 Metabolic encephalopathy: Secondary | ICD-10-CM | POA: Diagnosis not present

## 2022-05-17 DIAGNOSIS — N179 Acute kidney failure, unspecified: Secondary | ICD-10-CM | POA: Diagnosis not present

## 2022-05-17 DIAGNOSIS — I5033 Acute on chronic diastolic (congestive) heart failure: Secondary | ICD-10-CM | POA: Diagnosis not present

## 2022-05-17 DIAGNOSIS — J9621 Acute and chronic respiratory failure with hypoxia: Secondary | ICD-10-CM | POA: Diagnosis not present

## 2022-05-17 LAB — URINALYSIS, ROUTINE W REFLEX MICROSCOPIC
Bilirubin Urine: NEGATIVE
Glucose, UA: NEGATIVE mg/dL
Ketones, ur: NEGATIVE mg/dL
Nitrite: POSITIVE — AB
Protein, ur: NEGATIVE mg/dL
Specific Gravity, Urine: 1.006 (ref 1.005–1.030)
WBC, UA: >50 WBC/hpf — ABNORMAL HIGH (ref 0–5)
pH: 8 (ref 5.0–8.0)

## 2022-05-17 LAB — CBC
HCT: 30.6 % — ABNORMAL LOW (ref 36.0–46.0)
Hemoglobin: 9.4 g/dL — ABNORMAL LOW (ref 12.0–15.0)
MCH: 27.6 pg (ref 26.0–34.0)
MCHC: 30.7 g/dL (ref 30.0–36.0)
MCV: 90 fL (ref 80.0–100.0)
Platelets: 213 10*3/uL (ref 150–400)
RBC: 3.4 MIL/uL — ABNORMAL LOW (ref 3.87–5.11)
RDW: 16 % — ABNORMAL HIGH (ref 11.5–15.5)
WBC: 11.6 10*3/uL — ABNORMAL HIGH (ref 4.0–10.5)
nRBC: 0 % (ref 0.0–0.2)

## 2022-05-17 LAB — RENAL FUNCTION PANEL
Albumin: 2.7 g/dL — ABNORMAL LOW (ref 3.5–5.0)
Anion gap: 9 (ref 5–15)
BUN: 41 mg/dL — ABNORMAL HIGH (ref 8–23)
CO2: 34 mmol/L — ABNORMAL HIGH (ref 22–32)
Calcium: 8.4 mg/dL — ABNORMAL LOW (ref 8.9–10.3)
Chloride: 94 mmol/L — ABNORMAL LOW (ref 98–111)
Creatinine, Ser: 1.63 mg/dL — ABNORMAL HIGH (ref 0.44–1.00)
GFR, Estimated: 30 mL/min — ABNORMAL LOW (ref >60)
Glucose, Bld: 230 mg/dL — ABNORMAL HIGH (ref 70–99)
Phosphorus: 3.4 mg/dL (ref 2.5–4.6)
Potassium: 4.5 mmol/L (ref 3.5–5.1)
Sodium: 137 mmol/L (ref 135–145)

## 2022-05-17 LAB — GLUCOSE, CAPILLARY
Glucose-Capillary: 163 mg/dL — ABNORMAL HIGH (ref 70–99)
Glucose-Capillary: 278 mg/dL — ABNORMAL HIGH (ref 70–99)
Glucose-Capillary: 151 mg/dL — ABNORMAL HIGH (ref 70–99)
Glucose-Capillary: 235 mg/dL — ABNORMAL HIGH (ref 70–99)

## 2022-05-17 LAB — BRAIN NATRIURETIC PEPTIDE: B Natriuretic Peptide: 201.9 pg/mL — ABNORMAL HIGH (ref 0.0–100.0)

## 2022-05-17 LAB — MAGNESIUM: Magnesium: 2.5 mg/dL — ABNORMAL HIGH (ref 1.7–2.4)

## 2022-05-17 MED ORDER — CEFTRIAXONE SODIUM 1 G IJ SOLR
1.0000 g | INTRAMUSCULAR | Status: DC
  Administered 2022-05-17: 1 g via INTRAVENOUS
  Filled 2022-05-17: qty 10

## 2022-05-17 MED ORDER — FUROSEMIDE 10 MG/ML IJ SOLN
60.0000 mg | Freq: Two times a day (BID) | INTRAMUSCULAR | Status: DC
  Administered 2022-05-17 – 2022-05-21 (×8): 60 mg via INTRAVENOUS
  Filled 2022-05-17 (×9): qty 6

## 2022-05-17 NOTE — Progress Notes (Signed)
Pt resting comfortably on 2L [place]. No increased WOB noted, BiPAP not indicated at this time. RT will continue to monitor.

## 2022-05-17 NOTE — Progress Notes (Signed)
PROGRESS NOTE  [name] HQP:[id number] DOB: [birth year]   PCP: Hoyt Koch, MD  Patient is from: Home.  DOA: 05/14/2022 LOS: 3  Chief complaints Chief Complaint  Patient presents with   Shortness of Breath     Brief Narrative / Interim history: 88 year old F with PMH of chronic diastolic CHF, COPD, chronic hypoxic RF on 3 L, recent "UTI", CKD-3, CAD, DM-2, morbid obesity, HTN and HLD presenting with confusion and hypoxia to 65% on home 3 L.  Family reports recent diagnosis of "UTI" and she increased her Macrobid to twice daily for 7 days.  However, she had no UTI symptoms on presentation.  In ED, 100% on NRB.  Other vitals normal.  WBC 13.1 with left shift.  Hgb 10.8.  580.  Troponin 12> 17 lactic acid 0.7.  NA 134.  K5.9. Cr 1.91.  BUN 46.  AG 16.  Bicarb 24.  COVID-19 and influenza PCR nonreactive.  Portable CXR with acute pulmonary edema with trace pleural effusion.  VBG 7.23/87/172/37.  UA and cultures drawn.  TTE ordered.  Patient was started on BiPAP and IV Lasix.  Admitted for acute on chronic respiratory failure with hypoxia and hypercapnia in the setting of acute on chronic diastolic CHF, and acute metabolic encephalopathy and AKI.  The next day, patient's respiratory status, blood gas and mental status.  Urine and blood cultures NGTD.  Remained on IV Lasix for further diuresis.  Subjective: Seen and examined earlier this morning.  No major events overnight of this morning.  Feels better.  Was not able to sleep well.  She has -4.5 L.  Creatinine improving.  Objective: Vitals:   05/17/22 0737 05/17/22 0814 05/17/22 1155 05/17/22 1600  BP:  (!) 144/53 (!) 147/53 (!) 137/54  Pulse:  76 72 71  Resp: '18 17 20 18  '$ Temp:  98.5 F (36.9 C) 98.4 F (36.9 C) 99 F (37.2 C)  TempSrc:  Oral Oral Oral  SpO2:  96% 94% 93%  Weight:      Height:        Examination:  GENERAL: No apparent distress.  Nontoxic. HEENT: MMM.  Vision and hearing grossly intact.  NECK:  Supple.  No apparent JVD.  RESP:  No IWOB.  Fair aeration bilaterally. CVS:  RRR. Heart sounds normal.  ABD/GI/GU: BS+. Abd soft, NTND.  MSK/EXT:  Moves extremities. No apparent deformity.  Trace BLE edema. SKIN: no apparent skin lesion or wound NEURO: Awake and alert. Oriented appropriately.  No apparent focal neuro deficit. PSYCH: Calm. Normal affect.   Procedures:  None  Microbiology summarized: [id number] and influenza PCR nonreactive. Urine culture NGTD Blood culture NGTD  Assessment and plan: Principal Problem:   Acute on chronic respiratory failure with hypoxia and hypercapnia (HCC) Active Problems:   Hypertension, essential   Mixed dyslipidemia   Diabetes with neurologic complications (HCC)   COPD (chronic obstructive pulmonary disease) (HCC)   Acute on chronic diastolic CHF (congestive heart failure) (HCC)   Acute kidney injury superimposed on chronic kidney disease ([place])   Acute metabolic encephalopathy   Morbid obesity ([place])   Suspected sleep apnea   DNR (do not resuscitate)   Hyponatremia   Normocytic anemia  Acute on chronic respiratory failure with hypoxia and hypercapnia: Reportedly desaturated to 65% on home 3 L at home.  Elevated BNP and CXR suggests acute on chronic diastolic CHF. VBG 7.23/87/172/37 suggests obstructive etiology but she does not seem to be in COPD exacerbation on exam.  Respiratory symptoms, oxygen  requirement, blood gas improved. -Wean oxygen.  Minimum oxygen to keep saturation above 88%.  Patient is at risk for CO2 retention. -Gave her incentive spirometry and encouraged her to work on this -BiPAP at night and when she naps-but patient not compliant. -Could consider outpatient sleep study but she says she did not tolerate BiPAP/CPAP in the past -Continue IV diuretics  Acute on chronic diastolic CHF: TTE with normal LVEF but G2 DD.  BNP elevated to 580.  CXR with pulmonary edema. BLE edema and BNP improved.  Net -4.5 L.  Venous Doppler  negative for DVT.  Creatinine improving. -Increase IV Lasix to 60 mg twice daily starting tonight -Monitor intake and output, daily weight, renal functions and electrolytes -Sodium and fluid restriction  Acute metabolic encephalopathy: Likely due to hypercapnia.  She is also on opiates, low-dose gabapentin and Ativan.  Resolved.  No focal neurodeficit. -Manage respiratory failure as above -Minimize sedating medications -Reorientation and delirium precautions  AKI/azotemia on CKD-3B: b/l Cr ~1.6. Recent Labs    11/12/21 1355 01/22/22 1549 05/14/22 0507 05/15/22 0240 05/16/22 0045 05/17/22 0139  BUN 44* 38* 46* 49* 47* 41*  CREATININE 1.61* 1.47* 1.91* 1.93* 1.68* 1.63*  -Continue monitoring while on diuretics.  Chronic COPD: Stable -Continue Breo Ellipta. -Continue DuoNeb twice daily and as needed albuterol -Minimum oxygen to keep saturation above 88%   H/o UTI-no UTI symptoms.  UA and urine culture negative here.  Was on Macrobid twice daily outpatient -Hold antibiotics.  Controlled IDDM-2 with CKD-3B, HLD and hyperglycemia: A1c 6.9%. Recent Labs  Lab 05/16/22 1625 05/16/22 2118 05/17/22 0813 05/17/22 1200 05/17/22 1601  GLUCAP 247* 290* 163* 278* 151*  -Increased Semglee to 15 units twice daily -Continue SSI-sensitive -Further adjustment as appropriate -Continue Lipitor.  Hyponatremia: Likely hypervolemic.  Resolved.  Normocytic anemia: Hgb slightly lower.  No evidence of bleeding. Recent Labs    01/22/22 1549 05/14/22 0507 05/14/22 0658 05/14/22 0904 05/15/22 0240 05/17/22 0139  HGB 11.1* 10.8* 12.2 10.9* 9.3* 9.4*  -Continue monitoring  Essential hypertension: BP within acceptable range. -Continue holding Norvasc and hydralazine. -Continue home metoprolol at reduced dose -Lasix as above   Gout -Continue allopurinol  Physical deconditioning -PT/OT eval   Goal of care counseling/DNR  Morbid obesity: Elevated BMI with comorbid conditions as  above. Body mass index is 37.16 kg/m. -Encourage lifestyle change to lose weight   Nutrition Problem: Increased nutrient needs Etiology: chronic illness (COPD, CHF) Signs/Symptoms: estimated needs Interventions: MVI, Glucerna shake, Liberalize Diet   DVT prophylaxis:  enoxaparin (LOVENOX) injection 30 mg Start: 05/14/22 1230  Code Status: DNR/DNI Family Communication: Attempted to call patient's son but no answer. Level of care: Progressive due to as needed BiPAP Status is: Inpatient Remains inpatient appropriate because: Acute on chronic respiratory failure with hypoxia and hypercapnia and acute on chronic diastolic CHF, AKI   Final disposition: Home with [place] Consultants:  None  Sch Meds:  Scheduled Meds:  allopurinol  100 mg Oral Daily   aspirin EC  81 mg Oral q morning   atorvastatin  40 mg Oral q morning   docusate sodium  100 mg Oral BID   enoxaparin (LOVENOX) injection  30 mg Subcutaneous Q24H   feeding supplement (GLUCERNA SHAKE)  237 mL Oral Q24H   fluticasone  2 spray Each Nare Daily   fluticasone furoate-vilanterol  1 puff Inhalation Daily   furosemide  60 mg Intravenous BID   gabapentin  100 mg Oral BID   insulin aspart  0-5 Units Subcutaneous  QHS   insulin aspart  0-9 Units Subcutaneous TID WC   insulin glargine-yfgn  15 Units Subcutaneous BID   ipratropium-albuterol  3 mL Nebulization BID   metoprolol tartrate  12.5 mg Oral BID   multivitamin with minerals  1 tablet Oral Daily   sodium chloride flush  3 mL Intravenous Q12H   Continuous Infusions: PRN Meds:.acetaminophen **OR** acetaminophen, albuterol, ALPRAZolam, bisacodyl, hydrALAZINE, ondansetron **OR** ondansetron (ZOFRAN) IV, polyethylene glycol, traZODone  Antimicrobials: Anti-infectives (From admission, onward)    None        I have personally reviewed the following labs and images: CBC: Recent Labs  Lab 05/14/22 0507 05/14/22 0658 05/14/22 0904 05/15/22 0240 05/17/22 0139  WBC  13.1*  --   --  9.6 11.6*  NEUTROABS 10.8*  --   --   --   --   HGB 10.8* 12.2 10.9* 9.3* 9.4*  HCT 36.5 36.0 32.0* 31.3* 30.6*  MCV 93.1  --   --  91.3 90.0  PLT 241  --   --  201 213   BMP &GFR Recent Labs  Lab 05/14/22 0507 05/14/22 0658 05/14/22 0904 05/15/22 0240 05/16/22 0045 05/17/22 0139  NA 134* 130* 131* 134* 137 137  K 5.9* 5.7* 5.3* 4.8 4.7 4.5  CL 94*  --   --  95* 97* 94*  CO2 24  --   --  32 33* 34*  GLUCOSE 156*  --   --  92 145* 230*  BUN 46*  --   --  49* 47* 41*  CREATININE 1.91*  --   --  1.93* 1.68* 1.63*  CALCIUM 8.9  --   --  8.4* 8.6* 8.4*  MG  --   --   --   --  2.4 2.5*  PHOS  --   --   --   --  3.1 3.4   Estimated Creatinine Clearance: 27.2 mL/min (A) (by C-G formula based on SCr of 1.63 mg/dL (H)). Liver & Pancreas: Recent Labs  Lab 05/14/22 0507 05/16/22 0045 05/17/22 0139  AST 21  --   --   ALT 17  --   --   ALKPHOS 80  --   --   BILITOT 0.6  --   --   PROT 6.4*  --   --   ALBUMIN 3.5 2.9* 2.7*   Recent Labs  Lab 05/14/22 0507  LIPASE 34   No results for input(s): "AMMONIA" in the last 168 hours. Diabetic: Recent Labs    05/15/22 0240  HGBA1C 6.9*   Recent Labs  Lab 05/16/22 1625 05/16/22 2118 05/17/22 0813 05/17/22 1200 05/17/22 1601  GLUCAP 247* 290* 163* 278* 151*   Cardiac Enzymes: Recent Labs  Lab 05/16/22 0045  CKTOTAL 57   No results for input(s): "PROBNP" in the last 8760 hours. Coagulation Profile: No results for input(s): "INR", "PROTIME" in the last 168 hours. Thyroid Function Tests: Recent Labs    05/16/22 0045  TSH 0.918   Lipid Profile: No results for input(s): "CHOL", "HDL", "LDLCALC", "TRIG", "CHOLHDL", "LDLDIRECT" in the last 72 hours. Anemia Panel: Recent Labs    05/16/22 0045  VITAMINB12 1,001*  FOLATE >40.0  FERRITIN 103  TIBC 242*  IRON 24*  RETICCTPCT 3.0   Urine analysis:    Component Value Date/Time   COLORURINE YELLOW 05/14/2022 1311   APPEARANCEUR HAZY (A) 05/14/2022  1311   APPEARANCEUR Clear 05/23/2020 1627   LABSPEC 1.009 05/14/2022 1311   PHURINE 5.0 05/14/2022 1311  GLUCOSEU NEGATIVE 05/14/2022 1311   GLUCOSEU NEGATIVE 01/22/2022 1549   HGBUR NEGATIVE 05/14/2022 1311   BILIRUBINUR NEGATIVE 05/14/2022 1311   BILIRUBINUR Negative 05/23/2020 [place] 05/14/2022 1311   PROTEINUR NEGATIVE 05/14/2022 1311   UROBILINOGEN 0.2 01/22/2022 1549   NITRITE NEGATIVE 05/14/2022 1311   LEUKOCYTESUR NEGATIVE 05/14/2022 1311   Sepsis Labs: Invalid input(s): "PROCALCITONIN", "LACTICIDVEN"  Microbiology: Recent Results (from the past 240 hour(s))  Resp Panel by RT-PCR (Flu A&B, Covid) Anterior Nasal Swab     Status: None   Collection Time: 05/14/22  6:23 AM   Specimen: Anterior Nasal Swab  Result Value Ref Range Status   SARS Coronavirus 2 by RT PCR NEGATIVE NEGATIVE Final    Comment: (NOTE) SARS-CoV-2 target nucleic acids are NOT DETECTED.  The SARS-CoV-2 RNA is generally detectable in upper respiratory specimens during the acute phase of infection. The lowest concentration of SARS-CoV-2 viral copies this assay can detect is 138 copies/mL. A negative result does not preclude SARS-Cov-2 infection and should not be used as the sole basis for treatment or other patient management decisions. A negative result may occur with  improper specimen collection/handling, submission of specimen other than nasopharyngeal swab, presence of viral mutation(s) within the areas targeted by this assay, and inadequate number of viral copies(<138 copies/mL). A negative result must be combined with clinical observations, patient history, and epidemiological information. The expected result is Negative.  Fact Sheet for Patients:  EntrepreneurPulse.com.au  Fact Sheet for Healthcare Providers:  IncredibleEmployment.be  This test is no t yet approved or cleared by the [place] FDA and  has been authorized for  detection and/or diagnosis of SARS-CoV-2 by FDA under an Emergency Use Authorization (EUA). This EUA will remain  in effect (meaning this test can be used) for the duration of the COVID-19 declaration under Section 564(b)(1) of the Act, 21 U.S.C.section 360bbb-3(b)(1), unless the authorization is terminated  or revoked sooner.       Influenza A by PCR NEGATIVE NEGATIVE Final   Influenza B by PCR NEGATIVE NEGATIVE Final    Comment: (NOTE) The Xpert Xpress SARS-CoV-2/FLU/RSV plus assay is intended as an aid in the diagnosis of influenza from Nasopharyngeal swab specimens and should not be used as a sole basis for treatment. Nasal washings and aspirates are unacceptable for Xpert Xpress SARS-CoV-2/FLU/RSV testing.  Fact Sheet for Patients: EntrepreneurPulse.com.au  Fact Sheet for Healthcare Providers: IncredibleEmployment.be  This test is not yet approved or cleared by the [place] FDA and has been authorized for detection and/or diagnosis of SARS-CoV-2 by FDA under an Emergency Use Authorization (EUA). This EUA will remain in effect (meaning this test can be used) for the duration of the COVID-19 declaration under Section 564(b)(1) of the Act, 21 U.S.C. section 360bbb-3(b)(1), unless the authorization is terminated or revoked.  Performed at [facility], [place] [address] [place], [place] [address]   Culture, blood (routine x 2)     Status: None (Preliminary result)   Collection Time: 05/14/22  6:23 AM   Specimen: BLOOD  Result Value Ref Range Status   Specimen Description BLOOD RIGHT ANTECUBITAL  Final   Special Requests   Final    BOTTLES DRAWN AEROBIC AND ANAEROBIC Blood Culture results may not be optimal due to an excessive volume of blood received in culture bottles   Culture   Final    NO GROWTH 3 DAYS Performed at [facility], [place] [address], [place], [place] [address]  Report Status PENDING  Incomplete  Culture,  blood (routine x 2)     Status: None (Preliminary result)   Collection Time: 05/14/22  6:25 AM   Specimen: BLOOD  Result Value Ref Range Status   Specimen Description BLOOD LEFT ANTECUBITAL  Final   Special Requests   Final    BOTTLES DRAWN AEROBIC AND ANAEROBIC Blood Culture results may not be optimal due to an excessive volume of blood received in culture bottles   Culture   Final    NO GROWTH 3 DAYS Performed at [facility], [place] [address], [place], [place] [address]    Report Status PENDING  Incomplete  Urine Culture     Status: None   Collection Time: 05/14/22  7:57 AM   Specimen: Urine, Clean Catch  Result Value Ref Range Status   Specimen Description URINE, CLEAN CATCH  Final   Special Requests NONE  Final   Culture   Final    NO GROWTH Performed at [facility], [place] [address], [place], [place] [address]    Report Status 05/15/2022 FINAL  Final    Radiology Studies: No results found.    Mirage Pfefferkorn T. [place]  If 7PM-7AM, please contact night-coverage [email or web address] 05/17/2022, 5:22 PM

## 2022-05-17 NOTE — Progress Notes (Signed)
Patient pulled out IV. New 22g IV placed to left forearm. Will monitor

## 2022-05-17 NOTE — Care Management Important Message (Signed)
Important Message  Patient Details  Name: [name] MRN: [id number] Date of Birth: [birth year]   Medicare Important Message Given:  Yes     [name] [name] 05/17/2022, 3:53 PM

## 2022-05-17 NOTE — TOC Progression Note (Signed)
Transition of Care [facility]) - Progression Note    Patient Details  Name: [name] MRN: [id number] Date of Birth: 1930-11-23  Transition of Care [facility]) CM/SW [place], RN Phone Number: 05/17/2022, 4:14 PM  Clinical Narrative:     Spoke to son regarding oxygen DME and NIV.Marland Kitchen Patient has been  having trouble with oxygen at home. This has been addressed already with the director. We will need new qualifications prior to DC. NIV may be helpful, however she has trouble with  masks and anxiety, have to weight the pros and cons of this. Discussed obtaining a palliative care consult for goals of care. Mr. Morine is accepting of this.  Will have Director of [employer] call son, as there are several issues with oxygen that  they need to work through.  Discussed home health and how they assess need.  He will speak to his mother about these things, she is somewhat confused and hard of hearing. He already has a appointment scheduled with primary care MD post hospitalization.   Expected Discharge Plan: [place] Barriers to Discharge: Continued Medical Work up  Expected Discharge Plan and Services Expected Discharge Plan: [place]   Discharge Planning Services: CM Consult Post Acute Care Choice: [place] arrangements for the past 2 months: Single Family Home                           HH Arranged: PT, OT, Nurse's Aide HH Agency: [place] Date [facility] Agency Contacted: 05/16/22 Time [place]: 1344 Representative spoke with at [place]: [place] (SDOH) Interventions    Readmission Risk Interventions    11/07/2020    3:49 PM  Readmission Risk Prevention Plan  Transportation Screening Complete  PCP or Specialist Appt within 3-5 Days Complete  HRI or [place] Patient refused  Social Work Consult for [place] Planning/Counseling Complete  Palliative Care Screening  Not Applicable

## 2022-05-17 NOTE — Consult Note (Signed)
   [facility] CM Inpatient Consult   05/17/2022  [name] May 08, 1931 [id number]  [facility] [facility] [ACO] Patient: Medicare ACO REACH  Primary Care Provider:  Hoyt Koch, MD, [facility] at    is an embedded provider with a Chronic Care Management team and program, and is listed for the transition of care follow up and appointments.  Patient reviewed for high risk score  Communication will be with patient's son per inpatient TOC RNCM.  Patient was screened for Embedded practice service needs for chronic care management/care coordination. Currently, patient is being recommended for home with home health.  Plan: Will continue to follow for [facility] needs for post hospital needs.  Please contact for further questions,  Natividad Brood, RN BSN [facility] Liaison  [phone] business mobile phone Toll free office [phone]  Fax number: [phone] [email or web address]

## 2022-05-18 ENCOUNTER — Other Ambulatory Visit: Payer: Self-pay | Admitting: Internal Medicine

## 2022-05-18 DIAGNOSIS — B965 Pseudomonas (aeruginosa) (mallei) (pseudomallei) as the cause of diseases classified elsewhere: Secondary | ICD-10-CM

## 2022-05-18 DIAGNOSIS — I5033 Acute on chronic diastolic (congestive) heart failure: Secondary | ICD-10-CM | POA: Diagnosis not present

## 2022-05-18 DIAGNOSIS — N390 Urinary tract infection, site not specified: Secondary | ICD-10-CM

## 2022-05-18 DIAGNOSIS — G9341 Metabolic encephalopathy: Secondary | ICD-10-CM | POA: Diagnosis not present

## 2022-05-18 DIAGNOSIS — N179 Acute kidney failure, unspecified: Secondary | ICD-10-CM | POA: Diagnosis not present

## 2022-05-18 DIAGNOSIS — J9621 Acute and chronic respiratory failure with hypoxia: Secondary | ICD-10-CM | POA: Diagnosis not present

## 2022-05-18 LAB — RENAL FUNCTION PANEL
Albumin: 2.8 g/dL — ABNORMAL LOW (ref 3.5–5.0)
Anion gap: 10 (ref 5–15)
BUN: 37 mg/dL — ABNORMAL HIGH (ref 8–23)
CO2: 33 mmol/L — ABNORMAL HIGH (ref 22–32)
Calcium: 8.7 mg/dL — ABNORMAL LOW (ref 8.9–10.3)
Chloride: 96 mmol/L — ABNORMAL LOW (ref 98–111)
Creatinine, Ser: 1.55 mg/dL — ABNORMAL HIGH (ref 0.44–1.00)
GFR, Estimated: 32 mL/min — ABNORMAL LOW (ref >60)
Glucose, Bld: 150 mg/dL — ABNORMAL HIGH (ref 70–99)
Phosphorus: 3.4 mg/dL (ref 2.5–4.6)
Potassium: 4.6 mmol/L (ref 3.5–5.1)
Sodium: 139 mmol/L (ref 135–145)

## 2022-05-18 LAB — GLUCOSE, CAPILLARY
Glucose-Capillary: 156 mg/dL — ABNORMAL HIGH (ref 70–99)
Glucose-Capillary: 242 mg/dL — ABNORMAL HIGH (ref 70–99)
Glucose-Capillary: 227 mg/dL — ABNORMAL HIGH (ref 70–99)
Glucose-Capillary: 256 mg/dL — ABNORMAL HIGH (ref 70–99)

## 2022-05-18 LAB — BRAIN NATRIURETIC PEPTIDE: B Natriuretic Peptide: 105 pg/mL — ABNORMAL HIGH (ref 0.0–100.0)

## 2022-05-18 LAB — MAGNESIUM: Magnesium: 2.5 mg/dL — ABNORMAL HIGH (ref 1.7–2.4)

## 2022-05-18 MED ORDER — INSULIN ASPART 100 UNIT/ML IJ SOLN
0.0000 [IU] | Freq: Three times a day (TID) | INTRAMUSCULAR | Status: DC
  Administered 2022-05-19 (×3): 5 [IU] via SUBCUTANEOUS
  Administered 2022-05-20: 8 [IU] via SUBCUTANEOUS
  Administered 2022-05-21: 3 [IU] via SUBCUTANEOUS

## 2022-05-18 MED ORDER — SODIUM CHLORIDE 0.9 % IV SOLN
1.0000 g | INTRAVENOUS | Status: DC
  Administered 2022-05-18 – 2022-05-20 (×3): 1 g via INTRAVENOUS
  Filled 2022-05-18 (×4): qty 10

## 2022-05-18 MED ORDER — INSULIN GLARGINE-YFGN 100 UNIT/ML ~~LOC~~ SOLN
20.0000 [IU] | Freq: Two times a day (BID) | SUBCUTANEOUS | Status: DC
  Administered 2022-05-18 – 2022-05-21 (×6): 20 [IU] via SUBCUTANEOUS
  Filled 2022-05-18 (×8): qty 0.2

## 2022-05-18 MED ORDER — INSULIN ASPART 100 UNIT/ML IJ SOLN
0.0000 [IU] | Freq: Every day | INTRAMUSCULAR | Status: DC
  Administered 2022-05-18: 3 [IU] via SUBCUTANEOUS
  Administered 2022-05-19: 2 [IU] via SUBCUTANEOUS

## 2022-05-18 NOTE — Progress Notes (Signed)
Pharmacy Antibiotic Note  [name] is a 88 y.o. female admitted on 05/14/2022 with acute on chronic respiratory failure with hypoxia and hypercapnia .  H/o "recent" UTI , was taking Macrobid.  Today the urine culture grew psedomonas aeruginosa/.  Blood cx no growth. Pharmacy has been consulted for Cefepime dosing for  psedomonas UTI.  She has AKI on Stage 3 CKD,  Scr 1.91 on admit > down to 1.55 LA 0.7 on admit.  Plan: Cefepime 1 g IV Q24hr. Monitor clinical status, renal function, de-escalation, LOTand culture results daily.    Height: '5\' 4"'$  (162.6 cm) Weight: 93.1 kg (205 lb 4 oz) IBW/kg (Calculated) : 54.7  Temp (24hrs), Avg:98.6 F (37 C), Min:98.2 F (36.8 C), Max:98.9 F (37.2 C)  Recent Labs  Lab 05/14/22 0507 05/14/22 0623 05/14/22 0855 05/15/22 0240 05/16/22 0045 05/17/22 0139 05/18/22 0739  WBC 13.1*  --   --  9.6  --  11.6*  --   CREATININE 1.91*  --   --  1.93* 1.68* 1.63* 1.55*  LATICACIDVEN  --  0.7 0.7  --   --   --   --     Estimated Creatinine Clearance: 27.8 mL/min (A) (by C-G formula based on SCr of 1.55 mg/dL (H)).    Allergies  Allergen Reactions   Atenolol Other (See Comments)    weakness   Chlorhexidine Other (See Comments)    Unknown reaction   Codeine Nausea And Vomiting    Hydrocodone is ok   Lactose Intolerance (Gi) Diarrhea   Lodine [Etodolac] Other (See Comments)    dizziness   Sulfa Drugs Cross Reactors Nausea Only   Sulfamethoxazole Nausea And Vomiting   Benzonatate Nausea And Vomiting   Escitalopram Oxalate Other (See Comments)    mild hallucinations   Metformin Nausea And Vomiting    Reaction to IR and XL   Oxycodone-Acetaminophen Other (See Comments)    Percocet does not relieve pts' pain   Pregabalin Other (See Comments)    Unknown reaction   Thank you for allowing pharmacy to be a part of this patient's care.  Nicole Cella, RPh Clinical Pharmacist  05/18/2022 5:49 PM

## 2022-05-18 NOTE — Progress Notes (Signed)
PROGRESS NOTE  [name] IDP:[id number] DOB: 1931-08-06   PCP: Hoyt Koch, MD  Patient is from: Home.  DOA: 05/14/2022 LOS: 4  Chief complaints Chief Complaint  Patient presents with   Shortness of Breath     Brief Narrative / Interim history: 88 year old F with PMH of chronic diastolic CHF, COPD, chronic hypoxic RF on 3 L, recent "UTI", CKD-3, CAD, DM-2, morbid obesity, HTN and HLD presenting with confusion and hypoxia to 65% on home 3 L.  Family reports recent diagnosis of "UTI" and she increased her Macrobid to twice daily for 7 days.  However, she had no UTI symptoms on presentation.  In ED, 100% on NRB.  Other vitals normal.  WBC 13.1 with left shift.  Hgb 10.8.  580.  Troponin 12> 17 lactic acid 0.7.  NA 134.  K5.9. Cr 1.91.  BUN 46.  AG 16.  Bicarb 24.  COVID-19 and influenza PCR nonreactive.  Portable CXR with acute pulmonary edema with trace pleural effusion.  VBG 7.23/87/172/37.  UA and cultures drawn.  TTE ordered.  Patient was started on BiPAP and IV Lasix.  Admitted for acute on chronic respiratory failure with hypoxia and hypercapnia in the setting of acute on chronic diastolic CHF, and acute metabolic encephalopathy and AKI.  The next day, patient's respiratory status, blood gas and mental status improved.  Urine and blood cultures NGTD.  Remained on IV Lasix for further diuresis.   She is also on cefepime for Pseudomonas UTI.  Culture sensitivity pending.  Subjective: Seen and examined earlier this morning.  No major events overnight of this morning.  Reports improvement in her breathing.  Denies pain.  Continues to endorse dysuria and some difficulty voiding.  She is not comfortable with purwick.  Patient's son at the bedside.  Objective: Vitals:   05/18/22 0500 05/18/22 0752 05/18/22 0757 05/18/22 1153  BP:  (!) 135/55  (!) 144/52  Pulse:  70 69 61  Resp:  '16 16 20  '$ Temp:  98.7 F (37.1 C)  98.7 F (37.1 C)  TempSrc:  Oral  Oral  SpO2:  94% 98% 91%   Weight: 93.1 kg     Height:        Examination:  GENERAL: No apparent distress.  Nontoxic. HEENT: MMM.  Vision and hearing grossly intact.  NECK: Supple.  No apparent JVD.  RESP: 90% on 2 L.  No IWOB.  Fair aeration bilaterally but limited exam due to body habitus CVS:  RRR. Heart sounds normal.  ABD/GI/GU: BS+. Abd soft, NTND.  MSK/EXT:  Moves extremities. No apparent deformity.  Trace BLE edema. SKIN: no apparent skin lesion or wound NEURO: Awake and alert. Oriented appropriately.  No apparent focal neuro deficit. PSYCH: Calm. Normal affect.   Procedures:  None  Microbiology summarized: 7/11-COVID-19 and influenza PCR nonreactive. 7/11-urine culture NGTD 7/11-blood culture NGTD 7/14-urine culture with Pseudomonas aeruginosa  Assessment and plan: Principal Problem:   Acute on chronic respiratory failure with hypoxia and hypercapnia (HCC) Active Problems:   Hypertension, essential   Mixed dyslipidemia   Diabetes with neurologic complications (HCC)   COPD (chronic obstructive pulmonary disease) (HCC)   Acute on chronic diastolic CHF (congestive heart failure) (HCC)   Acute kidney injury superimposed on chronic kidney disease ([place])   Acute metabolic encephalopathy   Morbid obesity ([place])   Suspected sleep apnea   DNR (do not resuscitate)   Hyponatremia   Normocytic anemia  Acute on chronic respiratory failure with hypoxia and hypercapnia: Reportedly  desaturated to 65% on home 3 L at home.  Elevated BNP and CXR suggests acute on chronic diastolic CHF. VBG 7.23/87/172/37 suggests obstructive etiology but she does not seem to be in COPD exacerbation on exam.  Respiratory symptoms, oxygen requirement, blood gas improved. -Wean oxygen.  Minimum oxygen to keep saturation above 88%.  Patient is at risk for CO2 retention. -Encourage incentive spirometry -BiPAP at night and when she naps-but patient not compliant. -Could consider outpatient sleep study but she says she did not  tolerate BiPAP/CPAP in the past -Continue IV diuretics  Acute on chronic diastolic CHF: TTE with normal LVEF but G2 DD.  BNP elevated to 580.  CXR with pulmonary edema. BLE edema and BNP improved.  I and O incomplete but net -4.5 L.  BNP improved.  Venous Doppler negative for DVT.  Creatinine improving. -Continue IV Lasix to 60 mg twice daily -Monitor intake and output, daily weight, renal functions and electrolytes -Sodium and fluid restriction  Acute metabolic encephalopathy: Likely due to hypercapnia.  She is also on opiates, low-dose gabapentin and Ativan.  Resolved.  No focal neurodeficit. -Manage respiratory failure as above -Minimize sedating medications -Reorientation and delirium precautions  AKI/azotemia on CKD-3B: b/l Cr ~1.6.  AKI resolved. Recent Labs    11/12/21 1355 01/22/22 1549 05/14/22 0507 05/15/22 0240 05/16/22 0045 05/17/22 0139 05/18/22 0739  BUN 44* 38* 46* 49* 47* 41* 37*  CREATININE 1.61* 1.47* 1.91* 1.93* 1.68* 1.63* 1.55*  -Continue monitoring while on diuretics.  Pseudomonas UTI: Patient had dysuria on 7/14.  UA concerning for UTI.  Urine culture with Pseudomonas aeruginosa.  She seems to have recurrent UTI.  On Macrobid outpatient. -Change ceftriaxone to cefepime -Follow culture sensitivity  Chronic COPD: Stable -Continue Breo Ellipta. -Continue DuoNeb twice daily and as needed albuterol -Minimum oxygen to keep saturation above 88%  Controlled IDDM-2 with CKD-3B, HLD and hyperglycemia: A1c 6.9%. Recent Labs  Lab 05/17/22 1601 05/17/22 2110 05/18/22 0754 05/18/22 1152 05/18/22 1613  GLUCAP 151* 235* 156* 242* 227*  -Increase Semglee to 20 units twice daily starting tonight -Increase SSI to moderate -Further adjustment as appropriate -Continue Lipitor.  Hyponatremia: Likely hypervolemic.  Resolved.  Normocytic anemia: Hgb slightly lower.  No evidence of bleeding. Recent Labs    01/22/22 1549 05/14/22 0507 05/14/22 0658 05/14/22 0904  05/15/22 0240 05/17/22 0139  HGB 11.1* 10.8* 12.2 10.9* 9.3* 9.4*  -Continue monitoring  Essential hypertension: BP within acceptable range. -Continue holding Norvasc and hydralazine. -Continue home metoprolol at reduced dose given borderline HR -Lasix as above   Gout -Continue allopurinol  Physical deconditioning -PT/OT eval   Goal of care counseling/DNR  Morbid obesity: Elevated BMI with comorbid conditions as above. Body mass index is 35.23 kg/m. Nutrition Problem: Increased nutrient needs Etiology: chronic illness (COPD, CHF) Signs/Symptoms: estimated needs Interventions: MVI, Glucerna shake, Liberalize Diet   DVT prophylaxis:  enoxaparin (LOVENOX) injection 30 mg Start: 05/14/22 1230  Code Status: DNR/DNI Family Communication: Updated patient's son at bedside. Level of care: Progressive due to as needed BiPAP Status is: Inpatient Remains inpatient appropriate because: Acute on chronic respiratory failure with hypoxia and hypercapnia and acute on chronic diastolic CHF and Pseudomonas UTI   Final disposition: Home with [place] Consultants:  None  Sch Meds:  Scheduled Meds:  allopurinol  100 mg Oral Daily   aspirin EC  81 mg Oral q morning   atorvastatin  40 mg Oral q morning   docusate sodium  100 mg Oral BID   enoxaparin (LOVENOX)  injection  30 mg Subcutaneous Q24H   feeding supplement (GLUCERNA SHAKE)  237 mL Oral Q24H   fluticasone  2 spray Each Nare Daily   fluticasone furoate-vilanterol  1 puff Inhalation Daily   furosemide  60 mg Intravenous BID   gabapentin  100 mg Oral BID   [START ON 05/19/2022] insulin aspart  0-15 Units Subcutaneous TID WC   insulin aspart  0-5 Units Subcutaneous QHS   insulin glargine-yfgn  20 Units Subcutaneous BID   ipratropium-albuterol  3 mL Nebulization BID   metoprolol tartrate  12.5 mg Oral BID   multivitamin with minerals  1 tablet Oral Daily   sodium chloride flush  3 mL Intravenous Q12H   Continuous Infusions:   PRN  Meds:.acetaminophen **OR** acetaminophen, albuterol, ALPRAZolam, bisacodyl, hydrALAZINE, ondansetron **OR** ondansetron (ZOFRAN) IV, polyethylene glycol, traZODone  Antimicrobials: Anti-infectives (From admission, onward)    Start     Dose/Rate Route Frequency Ordered Stop   05/17/22 2300  cefTRIAXone (ROCEPHIN) 1 g in sodium chloride 0.9 % 100 mL IVPB  Status:  Discontinued        1 g 200 mL/hr over 30 Minutes Intravenous Every 24 hours 05/17/22 2219 05/18/22 1744        I have personally reviewed the following labs and images: CBC: Recent Labs  Lab 05/14/22 0507 05/14/22 0658 05/14/22 0904 05/15/22 0240 05/17/22 0139  WBC 13.1*  --   --  9.6 11.6*  NEUTROABS 10.8*  --   --   --   --   HGB 10.8* 12.2 10.9* 9.3* 9.4*  HCT 36.5 36.0 32.0* 31.3* 30.6*  MCV 93.1  --   --  91.3 90.0  PLT 241  --   --  201 213   BMP &GFR Recent Labs  Lab 05/14/22 0507 05/14/22 0658 05/14/22 0904 05/15/22 0240 05/16/22 0045 05/17/22 0139 05/18/22 0739  NA 134*   < > 131* 134* 137 137 139  K 5.9*   < > 5.3* 4.8 4.7 4.5 4.6  CL 94*  --   --  95* 97* 94* 96*  CO2 24  --   --  32 33* 34* 33*  GLUCOSE 156*  --   --  92 145* 230* 150*  BUN 46*  --   --  49* 47* 41* 37*  CREATININE 1.91*  --   --  1.93* 1.68* 1.63* 1.55*  CALCIUM 8.9  --   --  8.4* 8.6* 8.4* 8.7*  MG  --   --   --   --  2.4 2.5* 2.5*  PHOS  --   --   --   --  3.1 3.4 3.4   < > = values in this interval not displayed.   Estimated Creatinine Clearance: 27.8 mL/min (A) (by C-G formula based on SCr of 1.55 mg/dL (H)). Liver & Pancreas: Recent Labs  Lab 05/14/22 0507 05/16/22 0045 05/17/22 0139 05/18/22 0739  AST 21  --   --   --   ALT 17  --   --   --   ALKPHOS 80  --   --   --   BILITOT 0.6  --   --   --   PROT 6.4*  --   --   --   ALBUMIN 3.5 2.9* 2.7* 2.8*   Recent Labs  Lab 05/14/22 0507  LIPASE 34   No results for input(s): "AMMONIA" in the last 168 hours. Diabetic: No results for input(s): "HGBA1C" in  the last 72 hours.  Recent Labs  Lab 05/17/22 1601 05/17/22 2110 05/18/22 0754 05/18/22 1152 05/18/22 1613  GLUCAP 151* 235* 156* 242* 227*   Cardiac Enzymes: Recent Labs  Lab 05/16/22 0045  CKTOTAL 57   No results for input(s): "PROBNP" in the last 8760 hours. Coagulation Profile: No results for input(s): "INR", "PROTIME" in the last 168 hours. Thyroid Function Tests: Recent Labs    05/16/22 0045  TSH 0.918   Lipid Profile: No results for input(s): "CHOL", "HDL", "LDLCALC", "TRIG", "CHOLHDL", "LDLDIRECT" in the last 72 hours. Anemia Panel: Recent Labs    05/16/22 0045  VITAMINB12 1,001*  FOLATE >40.0  FERRITIN 103  TIBC 242*  IRON 24*  RETICCTPCT 3.0   Urine analysis:    Component Value Date/Time   COLORURINE YELLOW 05/17/2022 1849   APPEARANCEUR CLOUDY (A) 05/17/2022 1849   APPEARANCEUR Clear 05/23/2020 1627   LABSPEC 1.006 05/17/2022 1849   PHURINE 8.0 05/17/2022 1849   GLUCOSEU NEGATIVE 05/17/2022 1849   GLUCOSEU NEGATIVE 01/22/2022 1549   HGBUR SMALL (A) 05/17/2022 1849   BILIRUBINUR NEGATIVE 05/17/2022 1849   BILIRUBINUR Negative 05/23/2020 [place] 05/17/2022 1849   PROTEINUR NEGATIVE 05/17/2022 1849   UROBILINOGEN 0.2 01/22/2022 1549   NITRITE POSITIVE (A) 05/17/2022 1849   LEUKOCYTESUR LARGE (A) 05/17/2022 1849   Sepsis Labs: Invalid input(s): "PROCALCITONIN", "LACTICIDVEN"  Microbiology: Recent Results (from the past 240 hour(s))  Resp Panel by RT-PCR (Flu A&B, Covid) Anterior Nasal Swab     Status: None   Collection Time: 05/14/22  6:23 AM   Specimen: Anterior Nasal Swab  Result Value Ref Range Status   SARS Coronavirus 2 by RT PCR NEGATIVE NEGATIVE Final    Comment: (NOTE) SARS-CoV-2 target nucleic acids are NOT DETECTED.  The SARS-CoV-2 RNA is generally detectable in upper respiratory specimens during the acute phase of infection. The lowest concentration of SARS-CoV-2 viral copies this assay can detect is 138  copies/mL. A negative result does not preclude SARS-Cov-2 infection and should not be used as the sole basis for treatment or other patient management decisions. A negative result may occur with  improper specimen collection/handling, submission of specimen other than nasopharyngeal swab, presence of viral mutation(s) within the areas targeted by this assay, and inadequate number of viral copies(<138 copies/mL). A negative result must be combined with clinical observations, patient history, and epidemiological information. The expected result is Negative.  Fact Sheet for Patients:  EntrepreneurPulse.com.au  Fact Sheet for Healthcare Providers:  IncredibleEmployment.be  This test is no t yet approved or cleared by the [place] FDA and  has been authorized for detection and/or diagnosis of SARS-CoV-2 by FDA under an Emergency Use Authorization (EUA). This EUA will remain  in effect (meaning this test can be used) for the duration of the COVID-19 declaration under Section 564(b)(1) of the Act, 21 U.S.C.section 360bbb-3(b)(1), unless the authorization is terminated  or revoked sooner.       Influenza A by PCR NEGATIVE NEGATIVE Final   Influenza B by PCR NEGATIVE NEGATIVE Final    Comment: (NOTE) The Xpert Xpress SARS-CoV-2/FLU/RSV plus assay is intended as an aid in the diagnosis of influenza from Nasopharyngeal swab specimens and should not be used as a sole basis for treatment. Nasal washings and aspirates are unacceptable for Xpert Xpress SARS-CoV-2/FLU/RSV testing.  Fact Sheet for Patients: EntrepreneurPulse.com.au  Fact Sheet for Healthcare Providers: IncredibleEmployment.be  This test is not yet approved or cleared by the [place] FDA and has been authorized for detection and/or diagnosis  of SARS-CoV-2 by FDA under an Emergency Use Authorization (EUA). This EUA will remain in effect (meaning  this test can be used) for the duration of the COVID-19 declaration under Section 564(b)(1) of the Act, 21 U.S.C. section 360bbb-3(b)(1), unless the authorization is terminated or revoked.  Performed at [facility], [place] [address], [place], [place] [address]   Culture, blood (routine x 2)     Status: None (Preliminary result)   Collection Time: 05/14/22  6:23 AM   Specimen: BLOOD  Result Value Ref Range Status   Specimen Description BLOOD RIGHT ANTECUBITAL  Final   Special Requests   Final    BOTTLES DRAWN AEROBIC AND ANAEROBIC Blood Culture results may not be optimal due to an excessive volume of blood received in culture bottles   Culture   Final    NO GROWTH 4 DAYS Performed at [facility], [place] [address], [place], [place] [address]    Report Status PENDING  Incomplete  Culture, blood (routine x 2)     Status: None (Preliminary result)   Collection Time: 05/14/22  6:25 AM   Specimen: BLOOD  Result Value Ref Range Status   Specimen Description BLOOD LEFT ANTECUBITAL  Final   Special Requests   Final    BOTTLES DRAWN AEROBIC AND ANAEROBIC Blood Culture results may not be optimal due to an excessive volume of blood received in culture bottles   Culture   Final    NO GROWTH 4 DAYS Performed at [facility], [place] [address], [place], [place] [address]    Report Status PENDING  Incomplete  Urine Culture     Status: None   Collection Time: 05/14/22  7:57 AM   Specimen: Urine, Clean Catch  Result Value Ref Range Status   Specimen Description URINE, CLEAN CATCH  Final   Special Requests NONE  Final   Culture   Final    NO GROWTH Performed at [facility], [place] [address], [place], [place] [address]    Report Status 05/15/2022 FINAL  Final  Urine Culture     Status: Abnormal (Preliminary result)   Collection Time: 05/17/22  6:17 PM   Specimen: In/Out Cath Urine  Result Value Ref Range Status   Specimen Description IN/OUT CATH URINE  Final   Special  Requests NONE  Final   Culture (A)  Final    >=100,000 COLONIES/mL PSEUDOMONAS AERUGINOSA SUSCEPTIBILITIES TO FOLLOW Performed at [facility], [place] [address] [place], [place] [address]    Report Status PENDING  Incomplete    Radiology Studies: No results found.    Aury Scollard T. [place]  If 7PM-7AM, please contact night-coverage [email or web address] 05/18/2022, 5:51 PM

## 2022-05-19 DIAGNOSIS — I5033 Acute on chronic diastolic (congestive) heart failure: Secondary | ICD-10-CM | POA: Diagnosis not present

## 2022-05-19 DIAGNOSIS — J9621 Acute and chronic respiratory failure with hypoxia: Secondary | ICD-10-CM | POA: Diagnosis not present

## 2022-05-19 DIAGNOSIS — N179 Acute kidney failure, unspecified: Secondary | ICD-10-CM | POA: Diagnosis not present

## 2022-05-19 DIAGNOSIS — G9341 Metabolic encephalopathy: Secondary | ICD-10-CM | POA: Diagnosis not present

## 2022-05-19 LAB — RENAL FUNCTION PANEL
Albumin: 2.6 g/dL — ABNORMAL LOW (ref 3.5–5.0)
Anion gap: 14 (ref 5–15)
BUN: 37 mg/dL — ABNORMAL HIGH (ref 8–23)
CO2: 31 mmol/L (ref 22–32)
Calcium: 9 mg/dL (ref 8.9–10.3)
Chloride: 93 mmol/L — ABNORMAL LOW (ref 98–111)
Creatinine, Ser: 1.54 mg/dL — ABNORMAL HIGH (ref 0.44–1.00)
GFR, Estimated: 32 mL/min — ABNORMAL LOW (ref >60)
Glucose, Bld: 200 mg/dL — ABNORMAL HIGH (ref 70–99)
Phosphorus: 3.8 mg/dL (ref 2.5–4.6)
Potassium: 4.5 mmol/L (ref 3.5–5.1)
Sodium: 138 mmol/L (ref 135–145)

## 2022-05-19 LAB — CULTURE, BLOOD (ROUTINE X 2)
Culture: NO GROWTH
Culture: NO GROWTH

## 2022-05-19 LAB — URINE CULTURE: Culture: >=100000 — AB

## 2022-05-19 LAB — GLUCOSE, CAPILLARY
Glucose-Capillary: 240 mg/dL — ABNORMAL HIGH (ref 70–99)
Glucose-Capillary: 209 mg/dL — ABNORMAL HIGH (ref 70–99)
Glucose-Capillary: 206 mg/dL — ABNORMAL HIGH (ref 70–99)
Glucose-Capillary: 243 mg/dL — ABNORMAL HIGH (ref 70–99)

## 2022-05-19 LAB — CBC
HCT: 31 % — ABNORMAL LOW (ref 36.0–46.0)
Hemoglobin: 9.8 g/dL — ABNORMAL LOW (ref 12.0–15.0)
MCH: 27.7 pg (ref 26.0–34.0)
MCHC: 31.6 g/dL (ref 30.0–36.0)
MCV: 87.6 fL (ref 80.0–100.0)
Platelets: 235 10*3/uL (ref 150–400)
RBC: 3.54 MIL/uL — ABNORMAL LOW (ref 3.87–5.11)
RDW: 15.9 % — ABNORMAL HIGH (ref 11.5–15.5)
WBC: 11.5 10*3/uL — ABNORMAL HIGH (ref 4.0–10.5)
nRBC: 0 % (ref 0.0–0.2)

## 2022-05-19 LAB — BRAIN NATRIURETIC PEPTIDE: B Natriuretic Peptide: 90.1 pg/mL (ref 0.0–100.0)

## 2022-05-19 LAB — MAGNESIUM: Magnesium: 2.2 mg/dL (ref 1.7–2.4)

## 2022-05-19 NOTE — Progress Notes (Signed)
PROGRESS NOTE  [name] MBE:[id number] DOB: [birth year]   PCP: Hoyt Koch, MD  Patient is from: Home.  DOA: 05/14/2022 LOS: 5  Chief complaints Chief Complaint  Patient presents with   Shortness of Breath     Brief Narrative / Interim history: 88 year old F with PMH of chronic diastolic CHF, COPD, chronic hypoxic RF on 3 L, recent "UTI", CKD-3, CAD, DM-2, morbid obesity, HTN and HLD presenting with confusion and hypoxia to 65% on home 3 L.  Family reports recent diagnosis of "UTI" and she increased her Macrobid to twice daily for 7 days.  However, she had no UTI symptoms on presentation.  In ED, 100% on NRB.  Other vitals normal.  WBC 13.1 with left shift.  Hgb 10.8.  580.  Troponin 12> 17 lactic acid 0.7.  NA 134.  K5.9. Cr 1.91.  BUN 46.  AG 16.  Bicarb 24.  COVID-19 and influenza PCR nonreactive.  Portable CXR with acute pulmonary edema with trace pleural effusion.  VBG 7.23/87/172/37.  UA and cultures drawn.  TTE ordered.  Patient was started on BiPAP and IV Lasix.  Admitted for acute on chronic respiratory failure with hypoxia and hypercapnia in the setting of acute on chronic diastolic CHF, and acute metabolic encephalopathy and AKI.  The next day, patient's respiratory status, blood gas and mental status improved.  Urine and blood cultures NGTD.  Remained on IV Lasix for further diuresis.   She is also on cefepime for Pseudomonas UTI.  Culture sensitivity pending.  Subjective: Seen and examined earlier this morning.  No major events overnight of this morning.  Reports improvement in her breathing.  Denies pain.  Dysuria has improved, dyspnea has significantly improved as well.    Objective: Vitals:   05/19/22 0800 05/19/22 0814 05/19/22 0815 05/19/22 0822  BP: (!) 138/54 (!) 150/57    Pulse: 76 76  80  Resp: (!) 27 (!) 24 16 (!) 22  Temp: 98.3 F (36.8 C) 98.5 F (36.9 C)    TempSrc: Oral Oral    SpO2: 91%  98% 98%  Weight:      Height:         Examination:  Awake Alert, Oriented X 3, No new F.N deficits, Normal affect Symmetrical Chest wall movement, Good air movement bilaterally, CTAB RRR,No Gallops,Rubs or new Murmurs, No Parasternal Heave +ve B.Sounds, Abd Soft, No tenderness, No rebound - guarding or rigidity. No Cyanosis, Clubbing or edema, No new Rash or bruise    Procedures:  None  Microbiology summarized: 7/11-COVID-19 and influenza PCR nonreactive. 7/11-urine culture NGTD 7/11-blood culture NGTD 7/14-urine culture with Pseudomonas aeruginosa  Assessment and plan: Principal Problem:   Acute on chronic respiratory failure with hypoxia and hypercapnia (HCC) Active Problems:   Hypertension, essential   Mixed dyslipidemia   Diabetes with neurologic complications (HCC)   COPD (chronic obstructive pulmonary disease) (HCC)   Acute on chronic diastolic CHF (congestive heart failure) (HCC)   Acute kidney injury superimposed on chronic kidney disease ([place])   Acute metabolic encephalopathy   Morbid obesity ([place])   Suspected sleep apnea   DNR (do not resuscitate)   Hyponatremia   Normocytic anemia   Urinary tract infection due to Pseudomonas aeruginosa  Acute on chronic respiratory failure with hypoxia and hypercapnia: Reportedly desaturated to 65% on home 3 L at home.  Elevated BNP and CXR suggests acute on chronic diastolic CHF. VBG 7.23/87/172/37 suggests obstructive etiology but she does not seem to be in COPD exacerbation on  exam.  Respiratory symptoms, oxygen requirement, blood gas improved. -Wean oxygen.  Minimum oxygen to keep saturation above 88%.  Patient is at risk for CO2 retention. -Encourage incentive spirometry -BiPAP at night and when she naps-but patient not compliant. -Could consider outpatient sleep study but she says she did not tolerate BiPAP/CPAP in the past -Continue IV diuretics, please see discussion below  Acute on chronic diastolic CHF: TTE with normal LVEF but G2 DD.  BNP elevated to  580.  CXR with pulmonary edema. BLE edema and BNP improved.  Volume status continues to improve, continue with current days Lasix as long blood pressure and renal function tolerates, she is -6 L since admission, at 1.5 over last 24 hours.  -Venous Doppler negative for DVT.  Creatinine improving. -Continue IV Lasix to 60 mg twice daily -Monitor intake and output, daily weight, renal functions and electrolytes -Sodium and fluid restriction  Acute metabolic encephalopathy: Likely due to hypercapnia.  She is also on opiates, low-dose gabapentin and Ativan.  Resolved.  No focal neurodeficit. -Manage respiratory failure as above -Minimize sedating medications -Reorientation and delirium precautions  AKI/azotemia on CKD-3B: b/l Cr ~1.6.  AKI resolved. Recent Labs    11/12/21 1355 01/22/22 1549 05/14/22 0507 05/15/22 0240 05/16/22 0045 05/17/22 0139 05/18/22 0739 05/19/22 0731  BUN 44* 38* 46* 49* 47* 41* 37* 37*  CREATININE 1.61* 1.47* 1.91* 1.93* 1.68* 1.63* 1.55* 1.54*  -Continue monitoring while on diuretics.  Pseudomonas UTI: Patient had dysuria on 7/14.  UA concerning for UTI.  Urine culture with Pseudomonas aeruginosa.  She seems to have recurrent UTI.  On Macrobid outpatient. -Change ceftriaxone to cefepime -Follow culture sensitivity  Chronic COPD: Stable -Continue Breo Ellipta. -Continue DuoNeb twice daily and as needed albuterol -Minimum oxygen to keep saturation above 88%  Controlled IDDM-2 with CKD-3B, HLD and hyperglycemia: A1c 6.9%. Recent Labs  Lab 05/18/22 1152 05/18/22 1613 05/18/22 2108 05/19/22 0813 05/19/22 1157  GLUCAP 242* 227* 256* 240* 209*  -Increase Semglee to 20 units twice daily starting tonight -Increase SSI to moderate -Further adjustment as appropriate -Continue Lipitor.  Hyponatremia: Likely hypervolemic.  Resolved.  Normocytic anemia: Hgb slightly lower.  No evidence of bleeding. Recent Labs    01/22/22 1549 05/14/22 0507 05/14/22 0658  05/14/22 0904 05/15/22 0240 05/17/22 0139 05/19/22 0731  HGB 11.1* 10.8* 12.2 10.9* 9.3* 9.4* 9.8*  -Continue monitoring  Essential hypertension: BP within acceptable range. -Continue holding Norvasc and hydralazine. -Continue home metoprolol at reduced dose given borderline HR -Lasix as above   Gout -Continue allopurinol  Physical deconditioning -PT/OT eval   Goal of care counseling/DNR  Morbid obesity: Elevated BMI with comorbid conditions as above. Body mass index is 36.25 kg/m. Nutrition Problem: Increased nutrient needs Etiology: chronic illness (COPD, CHF) Signs/Symptoms: estimated needs Interventions: MVI, Glucerna shake, Liberalize Diet   DVT prophylaxis:  enoxaparin (LOVENOX) injection 30 mg Start: 05/14/22 1230  Code Status: DNR/DNI Family Communication: previous MD updated at bedside 7/15 Level of care: Telemetry Medical due to as needed BiPAP Status is: Inpatient Remains inpatient appropriate because: Acute on chronic respiratory failure with hypoxia and hypercapnia and acute on chronic diastolic CHF and Pseudomonas UTI   Final disposition: Home with [place] Consultants:  None  Sch Meds:  Scheduled Meds:  allopurinol  100 mg Oral Daily   aspirin EC  81 mg Oral q morning   atorvastatin  40 mg Oral q morning   docusate sodium  100 mg Oral BID   enoxaparin (LOVENOX) injection  30 mg  Subcutaneous Q24H   feeding supplement (GLUCERNA SHAKE)  237 mL Oral Q24H   fluticasone  2 spray Each Nare Daily   fluticasone furoate-vilanterol  1 puff Inhalation Daily   furosemide  60 mg Intravenous BID   gabapentin  100 mg Oral BID   insulin aspart  0-15 Units Subcutaneous TID WC   insulin aspart  0-5 Units Subcutaneous QHS   insulin glargine-yfgn  20 Units Subcutaneous BID   ipratropium-albuterol  3 mL Nebulization BID   metoprolol tartrate  12.5 mg Oral BID   multivitamin with minerals  1 tablet Oral Daily   sodium chloride flush  3 mL Intravenous Q12H   Continuous  Infusions:  ceFEPime (MAXIPIME) IV 1 g (05/18/22 1842)    PRN Meds:.acetaminophen **OR** acetaminophen, albuterol, ALPRAZolam, bisacodyl, hydrALAZINE, ondansetron **OR** ondansetron (ZOFRAN) IV, polyethylene glycol, traZODone  Antimicrobials: Anti-infectives (From admission, onward)    Start     Dose/Rate Route Frequency Ordered Stop   05/18/22 1845  ceFEPIme (MAXIPIME) 1 g in sodium chloride 0.9 % 100 mL IVPB        1 g 200 mL/hr over 30 Minutes Intravenous Every 24 hours 05/18/22 1758     05/17/22 2300  cefTRIAXone (ROCEPHIN) 1 g in sodium chloride 0.9 % 100 mL IVPB  Status:  Discontinued        1 g 200 mL/hr over 30 Minutes Intravenous Every 24 hours 05/17/22 2219 05/18/22 1744        I have personally reviewed the following labs and images: CBC: Recent Labs  Lab 05/14/22 0507 05/14/22 0658 05/14/22 0904 05/15/22 0240 05/17/22 0139 05/19/22 0731  WBC 13.1*  --   --  9.6 11.6* 11.5*  NEUTROABS 10.8*  --   --   --   --   --   HGB 10.8* 12.2 10.9* 9.3* 9.4* 9.8*  HCT 36.5 36.0 32.0* 31.3* 30.6* 31.0*  MCV 93.1  --   --  91.3 90.0 87.6  PLT 241  --   --  201 213 235   BMP &GFR Recent Labs  Lab 05/15/22 0240 05/16/22 0045 05/17/22 0139 05/18/22 0739 05/19/22 0731  NA 134* 137 137 139 138  K 4.8 4.7 4.5 4.6 4.5  CL 95* 97* 94* 96* 93*  CO2 32 33* 34* 33* 31  GLUCOSE 92 145* 230* 150* 200*  BUN 49* 47* 41* 37* 37*  CREATININE 1.93* 1.68* 1.63* 1.55* 1.54*  CALCIUM 8.4* 8.6* 8.4* 8.7* 9.0  MG  --  2.4 2.5* 2.5* 2.2  PHOS  --  3.1 3.4 3.4 3.8   Estimated Creatinine Clearance: 28.3 mL/min (A) (by C-G formula based on SCr of 1.54 mg/dL (H)). Liver & Pancreas: Recent Labs  Lab 05/14/22 0507 05/16/22 0045 05/17/22 0139 05/18/22 0739 05/19/22 0731  AST 21  --   --   --   --   ALT 17  --   --   --   --   ALKPHOS 80  --   --   --   --   BILITOT 0.6  --   --   --   --   PROT 6.4*  --   --   --   --   ALBUMIN 3.5 2.9* 2.7* 2.8* 2.6*   Recent Labs  Lab  05/14/22 0507  LIPASE 34   No results for input(s): "AMMONIA" in the last 168 hours. Diabetic: No results for input(s): "HGBA1C" in the last 72 hours.  Recent Labs  Lab 05/18/22 1152 05/18/22  1613 05/18/22 2108 05/19/22 0813 05/19/22 1157  GLUCAP 242* 227* 256* 240* 209*   Cardiac Enzymes: Recent Labs  Lab 05/16/22 0045  CKTOTAL 57   No results for input(s): "PROBNP" in the last 8760 hours. Coagulation Profile: No results for input(s): "INR", "PROTIME" in the last 168 hours. Thyroid Function Tests: No results for input(s): "TSH", "T4TOTAL", "FREET4", "T3FREE", "THYROIDAB" in the last 72 hours.  Lipid Profile: No results for input(s): "CHOL", "HDL", "LDLCALC", "TRIG", "CHOLHDL", "LDLDIRECT" in the last 72 hours. Anemia Panel: No results for input(s): "VITAMINB12", "FOLATE", "FERRITIN", "TIBC", "IRON", "RETICCTPCT" in the last 72 hours.  Urine analysis:    Component Value Date/Time   COLORURINE YELLOW 05/17/2022 1849   APPEARANCEUR CLOUDY (A) 05/17/2022 1849   APPEARANCEUR Clear 05/23/2020 1627   LABSPEC 1.006 05/17/2022 1849   PHURINE 8.0 05/17/2022 1849   GLUCOSEU NEGATIVE 05/17/2022 1849   GLUCOSEU NEGATIVE 01/22/2022 1549   HGBUR SMALL (A) 05/17/2022 1849   BILIRUBINUR NEGATIVE 05/17/2022 1849   BILIRUBINUR Negative 05/23/2020 1627   [place] 05/17/2022 1849   PROTEINUR NEGATIVE 05/17/2022 1849   UROBILINOGEN 0.2 01/22/2022 1549   NITRITE POSITIVE (A) 05/17/2022 1849   LEUKOCYTESUR LARGE (A) 05/17/2022 1849   Sepsis Labs: Invalid input(s): "PROCALCITONIN", "LACTICIDVEN"  Microbiology: Recent Results (from the past 240 hour(s))  Resp Panel by RT-PCR (Flu A&B, Covid) Anterior Nasal Swab     Status: None   Collection Time: 05/14/22  6:23 AM   Specimen: Anterior Nasal Swab  Result Value Ref Range Status   SARS Coronavirus 2 by RT PCR NEGATIVE NEGATIVE Final    Comment: (NOTE) SARS-CoV-2 target nucleic acids are NOT DETECTED.  The SARS-CoV-2  RNA is generally detectable in upper respiratory specimens during the acute phase of infection. The lowest concentration of SARS-CoV-2 viral copies this assay can detect is 138 copies/mL. A negative result does not preclude SARS-Cov-2 infection and should not be used as the sole basis for treatment or other patient management decisions. A negative result may occur with  improper specimen collection/handling, submission of specimen other than nasopharyngeal swab, presence of viral mutation(s) within the areas targeted by this assay, and inadequate number of viral copies(<138 copies/mL). A negative result must be combined with clinical observations, patient history, and epidemiological information. The expected result is Negative.  Fact Sheet for Patients:  EntrepreneurPulse.com.au  Fact Sheet for Healthcare Providers:  IncredibleEmployment.be  This test is no t yet approved or cleared by the [place] FDA and  has been authorized for detection and/or diagnosis of SARS-CoV-2 by FDA under an Emergency Use Authorization (EUA). This EUA will remain  in effect (meaning this test can be used) for the duration of the COVID-19 declaration under Section 564(b)(1) of the Act, 21 U.S.C.section 360bbb-3(b)(1), unless the authorization is terminated  or revoked sooner.       Influenza A by PCR NEGATIVE NEGATIVE Final   Influenza B by PCR NEGATIVE NEGATIVE Final    Comment: (NOTE) The Xpert Xpress SARS-CoV-2/FLU/RSV plus assay is intended as an aid in the diagnosis of influenza from Nasopharyngeal swab specimens and should not be used as a sole basis for treatment. Nasal washings and aspirates are unacceptable for Xpert Xpress SARS-CoV-2/FLU/RSV testing.  Fact Sheet for Patients: EntrepreneurPulse.com.au  Fact Sheet for Healthcare Providers: IncredibleEmployment.be  This test is not yet approved or cleared by the  [place] FDA and has been authorized for detection and/or diagnosis of SARS-CoV-2 by FDA under an Emergency Use Authorization (EUA). This EUA will remain  in effect (meaning this test can be used) for the duration of the COVID-19 declaration under Section 564(b)(1) of the Act, 21 U.S.C. section 360bbb-3(b)(1), unless the authorization is terminated or revoked.  Performed at [facility], [place] [address] [place], [place] [address]   Culture, blood (routine x 2)     Status: None   Collection Time: 05/14/22  6:23 AM   Specimen: BLOOD  Result Value Ref Range Status   Specimen Description BLOOD RIGHT ANTECUBITAL  Final   Special Requests   Final    BOTTLES DRAWN AEROBIC AND ANAEROBIC Blood Culture results may not be optimal due to an excessive volume of blood received in culture bottles   Culture   Final    NO GROWTH 5 DAYS Performed at [facility], [place] [address], [place], [place] [address]    Report Status 05/19/2022 FINAL  Final  Culture, blood (routine x 2)     Status: None   Collection Time: 05/14/22  6:25 AM   Specimen: BLOOD  Result Value Ref Range Status   Specimen Description BLOOD LEFT ANTECUBITAL  Final   Special Requests   Final    BOTTLES DRAWN AEROBIC AND ANAEROBIC Blood Culture results may not be optimal due to an excessive volume of blood received in culture bottles   Culture   Final    NO GROWTH 5 DAYS Performed at [facility], [place] [address] [place], [place] [address]    Report Status 05/19/2022 FINAL  Final  Urine Culture     Status: None   Collection Time: 05/14/22  7:57 AM   Specimen: Urine, Clean Catch  Result Value Ref Range Status   Specimen Description URINE, CLEAN CATCH  Final   Special Requests NONE  Final   Culture   Final    NO GROWTH Performed at [facility], [place] [address] [place], [place] [address]    Report Status 05/15/2022 FINAL  Final  Urine Culture     Status: Abnormal   Collection Time: 05/17/22  6:17 PM    Specimen: In/Out Cath Urine  Result Value Ref Range Status   Specimen Description IN/OUT CATH URINE  Final   Special Requests   Final    NONE Performed at [facility], [place] [address], [place], [place] [address]    Culture >=100,000 COLONIES/mL PSEUDOMONAS AERUGINOSA (A)  Final   Report Status 05/19/2022 FINAL  Final   Organism ID, Bacteria PSEUDOMONAS AERUGINOSA (A)  Final      Susceptibility   Pseudomonas aeruginosa - MIC*    CEFTAZIDIME 2 SENSITIVE Sensitive     CIPROFLOXACIN <=0.25 SENSITIVE Sensitive     GENTAMICIN 2 SENSITIVE Sensitive     IMIPENEM 2 SENSITIVE Sensitive     PIP/TAZO <=4 SENSITIVE Sensitive     CEFEPIME 2 SENSITIVE Sensitive     * >=100,000 COLONIES/mL PSEUDOMONAS AERUGINOSA    Radiology Studies: No results found.    Phillips Climes MD [facility]  If 7PM-7AM, please contact night-coverage [email or web address] 05/19/2022, 12:11 PM

## 2022-05-20 ENCOUNTER — Inpatient Hospital Stay (HOSPITAL_COMMUNITY): Payer: Medicare Other

## 2022-05-20 ENCOUNTER — Encounter: Payer: Self-pay | Admitting: Internal Medicine

## 2022-05-20 DIAGNOSIS — J9622 Acute and chronic respiratory failure with hypercapnia: Secondary | ICD-10-CM | POA: Diagnosis not present

## 2022-05-20 DIAGNOSIS — J9621 Acute and chronic respiratory failure with hypoxia: Secondary | ICD-10-CM | POA: Diagnosis not present

## 2022-05-20 DIAGNOSIS — I5033 Acute on chronic diastolic (congestive) heart failure: Secondary | ICD-10-CM | POA: Diagnosis not present

## 2022-05-20 DIAGNOSIS — N179 Acute kidney failure, unspecified: Secondary | ICD-10-CM | POA: Diagnosis not present

## 2022-05-20 LAB — CBC
HCT: 32 % — ABNORMAL LOW (ref 36.0–46.0)
Hemoglobin: 9.8 g/dL — ABNORMAL LOW (ref 12.0–15.0)
MCH: 27.6 pg (ref 26.0–34.0)
MCHC: 30.6 g/dL (ref 30.0–36.0)
MCV: 90.1 fL (ref 80.0–100.0)
Platelets: 241 10*3/uL (ref 150–400)
RBC: 3.55 MIL/uL — ABNORMAL LOW (ref 3.87–5.11)
RDW: 15.9 % — ABNORMAL HIGH (ref 11.5–15.5)
WBC: 11 10*3/uL — ABNORMAL HIGH (ref 4.0–10.5)
nRBC: 0 % (ref 0.0–0.2)

## 2022-05-20 LAB — GLUCOSE, CAPILLARY
Glucose-Capillary: 114 mg/dL — ABNORMAL HIGH (ref 70–99)
Glucose-Capillary: 253 mg/dL — ABNORMAL HIGH (ref 70–99)
Glucose-Capillary: 139 mg/dL — ABNORMAL HIGH (ref 70–99)
Glucose-Capillary: 94 mg/dL (ref 70–99)
Glucose-Capillary: 151 mg/dL — ABNORMAL HIGH (ref 70–99)

## 2022-05-20 LAB — PULMONARY FUNCTION TEST
FEF 25-75 Pre: 0.87 L/sec
FEF2575-%Pred-Pre: 88 %
FEV1-%Pred-Pre: 49 %
FEV1-Pre: 0.82 L
FEV1FVC-%Pred-Pre: 112 %
FEV6-%Pred-Pre: 48 %
FEV6-Pre: 1.01 L
FEV6FVC-%Pred-Pre: 106 %
FVC-%Pred-Pre: 44 %
FVC-Pre: 1.01 L
Pre FEV1/FVC ratio: 81 %
Pre FEV6/FVC Ratio: 100 %

## 2022-05-20 LAB — BASIC METABOLIC PANEL
Anion gap: 6 (ref 5–15)
BUN: 37 mg/dL — ABNORMAL HIGH (ref 8–23)
CO2: 32 mmol/L (ref 22–32)
Calcium: 8.6 mg/dL — ABNORMAL LOW (ref 8.9–10.3)
Chloride: 98 mmol/L (ref 98–111)
Creatinine, Ser: 1.43 mg/dL — ABNORMAL HIGH (ref 0.44–1.00)
GFR, Estimated: 35 mL/min — ABNORMAL LOW (ref >60)
Glucose, Bld: 126 mg/dL — ABNORMAL HIGH (ref 70–99)
Potassium: 4.2 mmol/L (ref 3.5–5.1)
Sodium: 136 mmol/L (ref 135–145)

## 2022-05-20 NOTE — Progress Notes (Signed)
PROGRESS NOTE  [name] UXN:[id number] DOB: 06-14-1931   PCP: Hoyt Koch, MD  Patient is from: Home.  DOA: 05/14/2022 LOS: 6  Chief complaints Chief Complaint  Patient presents with   Shortness of Breath     Brief Narrative / Interim history:  88 year old F with PMH of chronic diastolic CHF, COPD, chronic hypoxic RF on 3 L, recent "UTI", CKD-3, CAD, DM-2, morbid obesity, HTN and HLD presenting with confusion and hypoxia to 65% on home 3 L.  Family reports recent diagnosis of "UTI" and she increased her Macrobid to twice daily for 7 days.  However, she had no UTI symptoms on presentation.  In ED, 100% on NRB.  Other vitals normal.  WBC 13.1 with left shift.  Hgb 10.8.  580.  Troponin 12> 17 lactic acid 0.7.  NA 134.  K5.9. Cr 1.91.  BUN 46.  AG 16.  Bicarb 24.  COVID-19 and influenza PCR nonreactive.  Portable CXR with acute pulmonary edema with trace pleural effusion.  VBG 7.23/87/172/37.  UA and cultures drawn.  TTE ordered.  Patient was started on BiPAP and IV Lasix.  Admitted for acute on chronic respiratory failure with hypoxia and hypercapnia in the setting of acute on chronic diastolic CHF, and acute metabolic encephalopathy and AKI.  The next day, patient's respiratory status, blood gas and mental status improved.  Urine and blood cultures NGTD.  Remained on IV Lasix for further diuresis.   She is also on cefepime for Pseudomonas UTI.  Culture sensitivity pending.  Subjective:  Seen and examined earlier this morning.  No major events overnight of this morning.  Reports good night sleep, no dyspnea, still complains of left foot pain.   Objective: Vitals:   05/20/22 0008 05/20/22 0426 05/20/22 0500 05/20/22 0800  BP: (!) 126/53 (!) 116/51  (!) 122/44  Pulse: 69 77  80  Resp: '20 20  17  '$ Temp: 97.8 F (36.6 C) 97.8 F (36.6 C)  99.4 F (37.4 C)  TempSrc: Oral Oral  Oral  SpO2: 92% 93%  93%  Weight:   95.2 kg   Height:        Examination:  Awake, alert,  oriented, no apparent distress . Good air entry bilaterally, no wheezing anteriorly. Abdomen soft  Lower extremity with no edema, clubbing or cyanosis, left foot with no swelling, tenderness or erythema.      Procedures:  None  Microbiology summarized: 7/11-COVID-19 and influenza PCR nonreactive. 7/11-urine culture NGTD 7/11-blood culture NGTD 7/14-urine culture with Pseudomonas aeruginosa  Assessment and plan: Principal Problem:   Acute on chronic respiratory failure with hypoxia and hypercapnia (HCC) Active Problems:   Hypertension, essential   Mixed dyslipidemia   Diabetes with neurologic complications (HCC)   COPD (chronic obstructive pulmonary disease) (HCC)   Acute on chronic diastolic CHF (congestive heart failure) (HCC)   Acute kidney injury superimposed on chronic kidney disease ([place])   Acute metabolic encephalopathy   Morbid obesity ([place])   Suspected sleep apnea   DNR (do not resuscitate)   Hyponatremia   Normocytic anemia   Urinary tract infection due to Pseudomonas aeruginosa  Acute on chronic respiratory failure with hypoxia and hypercapnia:  - Reportedly desaturated to 65% on home 3 L at home.  Elevated BNP and CXR suggests acute on chronic diastolic CHF. VBG 7.23/87/172/37 suggests obstructive etiology but she does not seem to be in COPD exacerbation on exam.  Respiratory symptoms, oxygen requirement, blood gas improved. -No BiPAP requirement over last few days. -  Peritrate status significantly improved with IV diuresis, she is currently at baseline on 2 L nasal cannula.  Acute on chronic diastolic CHF:  -TTE with normal LVEF but G2 DD.  BNP elevated to 580.  CXR with pulmonary edema. BLE edema and BNP improved. -Volume status significantly improved, no evidence of volume overload, no JVD, no lower extremity edema. -She is -7.5 L since admission. -Patient does not want to have any diuretics around bedtime upon discharge, so likely will continue with Lasix 80 mg  daily, and add Zaroxolyn 3 times weekly. -Monitor intake and output, daily weight, renal functions and electrolytes -Sodium and fluid restriction  Acute metabolic encephalopathy:  - Likely due to hypercapnia.  She is also on opiates, low-dose gabapentin and Ativan.  Resolved.  No focal neurodeficit. -Manage respiratory failure as above -Minimize sedating medications -Reorientation and delirium precautions  AKI/azotemia on CKD-3B: b/l Cr ~1.6.  AKI resolved. Recent Labs    11/12/21 1355 01/22/22 1549 05/14/22 0507 05/15/22 0240 05/16/22 0045 05/17/22 0139 05/18/22 0739 05/19/22 0731 05/20/22 0317  BUN 44* 38* 46* 49* 47* 41* 37* 37* 37*  CREATININE 1.61* 1.47* 1.91* 1.93* 1.68* 1.63* 1.55* 1.54* 1.43*  -Continue monitoring while on diuretics.  Pseudomonas UTI: -  Patient had dysuria on 7/14.  UA concerning for UTI.  Urine culture with Pseudomonas aeruginosa.  She seems to have recurrent UTI.  On Macrobid outpatient. -Changed ceftriaxone to cefepime  Chronic COPD: Stable -Continue Breo Ellipta. -Continue DuoNeb twice daily and as needed albuterol -Minimum oxygen to keep saturation above 88%  Controlled IDDM-2 with CKD-3B, HLD and hyperglycemia: A1c 6.9%. Recent Labs  Lab 05/19/22 0813 05/19/22 1157 05/19/22 1619 05/19/22 2106 05/20/22 0801  GLUCAP 240* 209* 206* 243* 114*  -Increase Semglee to 20 units twice daily starting tonight -Increase SSI to moderate -Further adjustment as appropriate -Continue Lipitor.  Hyponatremia: Likely hypervolemic.  Resolved.  Normocytic anemia: Hgb slightly lower.  No evidence of bleeding. Recent Labs    01/22/22 1549 05/14/22 0507 05/14/22 0658 05/14/22 0904 05/15/22 0240 05/17/22 0139 05/19/22 0731 05/20/22 0317  HGB 11.1* 10.8* 12.2 10.9* 9.3* 9.4* 9.8* 9.8*  -Continue monitoring  Essential hypertension: BP within acceptable range. -Continue holding Norvasc and hydralazine. -Continue home metoprolol at reduced dose  given borderline HR -Lasix as above   Gout -Continue allopurinol  Physical deconditioning -PT/OT eval  Left foot pain -No acute finding on physical exam, will obtain x-ray.   Goal of care counseling/DNR  Morbid obesity: Elevated BMI with comorbid conditions as above. Body mass index is 36.03 kg/m. Nutrition Problem: Increased nutrient needs Etiology: chronic illness (COPD, CHF) Signs/Symptoms: estimated needs Interventions: MVI, Glucerna shake, Liberalize Diet   DVT prophylaxis:  enoxaparin (LOVENOX) injection 30 mg Start: 05/14/22 1230  Code Status: DNR/DNI Family Communication:  -I have discussed with son at bedside, updated him about mother status, she is improving, back to baseline, and plan for discharge today, son became very upset about that, and he requested another PT/OT evaluation which I have assured him that  we will have her to be evaluated by PT and OT today, as well he had a concern about broken oxygen device at home, for which I have showed him as well we will have case manager arrange at home if needed, he was very anxious about her going home today, so I have told him we can hold on discharge today and will have it done tomorrow after she is seen by PT, OT and arrange for home oxygen today,  he was extremely anxious and upset.  He then requested a second opinion from another physician to another nurse in the hallway.and to staff on the front desk,   He had a very loud a conversation with staff at front desk .    Level of care: Telemetry Medical due to as needed BiPAP Status is: Inpatient Remains inpatient appropriate because: Acute on chronic respiratory failure with hypoxia and hypercapnia and acute on chronic diastolic CHF and Pseudomonas UTI   Final disposition: Home with [place] Consultants:  None  Sch Meds:  Scheduled Meds:  allopurinol  100 mg Oral Daily   aspirin EC  81 mg Oral q morning   atorvastatin  40 mg Oral q morning   docusate sodium  100 mg  Oral BID   enoxaparin (LOVENOX) injection  30 mg Subcutaneous Q24H   feeding supplement (GLUCERNA SHAKE)  237 mL Oral Q24H   fluticasone  2 spray Each Nare Daily   fluticasone furoate-vilanterol  1 puff Inhalation Daily   furosemide  60 mg Intravenous BID   gabapentin  100 mg Oral BID   insulin aspart  0-15 Units Subcutaneous TID WC   insulin aspart  0-5 Units Subcutaneous QHS   insulin glargine-yfgn  20 Units Subcutaneous BID   ipratropium-albuterol  3 mL Nebulization BID   metoprolol tartrate  12.5 mg Oral BID   multivitamin with minerals  1 tablet Oral Daily   sodium chloride flush  3 mL Intravenous Q12H   Continuous Infusions:  ceFEPime (MAXIPIME) IV Stopped (05/19/22 2026)    PRN Meds:.acetaminophen **OR** acetaminophen, albuterol, ALPRAZolam, bisacodyl, hydrALAZINE, ondansetron **OR** ondansetron (ZOFRAN) IV, polyethylene glycol, traZODone  Antimicrobials: Anti-infectives (From admission, onward)    Start     Dose/Rate Route Frequency Ordered Stop   05/18/22 1845  ceFEPIme (MAXIPIME) 1 g in sodium chloride 0.9 % 100 mL IVPB        1 g 200 mL/hr over 30 Minutes Intravenous Every 24 hours 05/18/22 1758     05/17/22 2300  cefTRIAXone (ROCEPHIN) 1 g in sodium chloride 0.9 % 100 mL IVPB  Status:  Discontinued        1 g 200 mL/hr over 30 Minutes Intravenous Every 24 hours 05/17/22 2219 05/18/22 1744        I have personally reviewed the following labs and images: CBC: Recent Labs  Lab 05/14/22 0507 05/14/22 0658 05/14/22 0904 05/15/22 0240 05/17/22 0139 05/19/22 0731 05/20/22 0317  WBC 13.1*  --   --  9.6 11.6* 11.5* 11.0*  NEUTROABS 10.8*  --   --   --   --   --   --   HGB 10.8*   < > 10.9* 9.3* 9.4* 9.8* 9.8*  HCT 36.5   < > 32.0* 31.3* 30.6* 31.0* 32.0*  MCV 93.1  --   --  91.3 90.0 87.6 90.1  PLT 241  --   --  201 213 235 241   < > = values in this interval not displayed.   BMP &GFR Recent Labs  Lab 05/16/22 0045 05/17/22 0139 05/18/22 0739  05/19/22 0731 05/20/22 0317  NA 137 137 139 138 136  K 4.7 4.5 4.6 4.5 4.2  CL 97* 94* 96* 93* 98  CO2 33* 34* 33* 31 32  GLUCOSE 145* 230* 150* 200* 126*  BUN 47* 41* 37* 37* 37*  CREATININE 1.68* 1.63* 1.55* 1.54* 1.43*  CALCIUM 8.6* 8.4* 8.7* 9.0 8.6*  MG 2.4 2.5* 2.5* 2.2  --   PHOS  3.1 3.4 3.4 3.8  --    Estimated Creatinine Clearance: 30.4 mL/min (A) (by C-G formula based on SCr of 1.43 mg/dL (H)). Liver & Pancreas: Recent Labs  Lab 05/14/22 0507 05/16/22 0045 05/17/22 0139 05/18/22 0739 05/19/22 0731  AST 21  --   --   --   --   ALT 17  --   --   --   --   ALKPHOS 80  --   --   --   --   BILITOT 0.6  --   --   --   --   PROT 6.4*  --   --   --   --   ALBUMIN 3.5 2.9* 2.7* 2.8* 2.6*   Recent Labs  Lab 05/14/22 0507  LIPASE 34   No results for input(s): "AMMONIA" in the last 168 hours. Diabetic: No results for input(s): "HGBA1C" in the last 72 hours.  Recent Labs  Lab 05/19/22 0813 05/19/22 1157 05/19/22 1619 05/19/22 2106 05/20/22 0801  GLUCAP 240* 209* 206* 243* 114*   Cardiac Enzymes: Recent Labs  Lab 05/16/22 0045  CKTOTAL 57   No results for input(s): "PROBNP" in the last 8760 hours. Coagulation Profile: No results for input(s): "INR", "PROTIME" in the last 168 hours. Thyroid Function Tests: No results for input(s): "TSH", "T4TOTAL", "FREET4", "T3FREE", "THYROIDAB" in the last 72 hours.  Lipid Profile: No results for input(s): "CHOL", "HDL", "LDLCALC", "TRIG", "CHOLHDL", "LDLDIRECT" in the last 72 hours. Anemia Panel: No results for input(s): "VITAMINB12", "FOLATE", "FERRITIN", "TIBC", "IRON", "RETICCTPCT" in the last 72 hours.  Urine analysis:    Component Value Date/Time   COLORURINE YELLOW 05/17/2022 1849   APPEARANCEUR CLOUDY (A) 05/17/2022 1849   APPEARANCEUR Clear 05/23/2020 1627   LABSPEC 1.006 05/17/2022 1849   PHURINE 8.0 05/17/2022 1849   GLUCOSEU NEGATIVE 05/17/2022 1849   GLUCOSEU NEGATIVE 01/22/2022 1549   HGBUR SMALL  (A) 05/17/2022 1849   BILIRUBINUR NEGATIVE 05/17/2022 1849   BILIRUBINUR Negative 05/23/2020 1627   [place] 05/17/2022 1849   PROTEINUR NEGATIVE 05/17/2022 1849   UROBILINOGEN 0.2 01/22/2022 1549   NITRITE POSITIVE (A) 05/17/2022 1849   LEUKOCYTESUR LARGE (A) 05/17/2022 1849   Sepsis Labs: Invalid input(s): "PROCALCITONIN", "LACTICIDVEN"  Microbiology: Recent Results (from the past 240 hour(s))  Resp Panel by RT-PCR (Flu A&B, Covid) Anterior Nasal Swab     Status: None   Collection Time: 05/14/22  6:23 AM   Specimen: Anterior Nasal Swab  Result Value Ref Range Status   SARS Coronavirus 2 by RT PCR NEGATIVE NEGATIVE Final    Comment: (NOTE) SARS-CoV-2 target nucleic acids are NOT DETECTED.  The SARS-CoV-2 RNA is generally detectable in upper respiratory specimens during the acute phase of infection. The lowest concentration of SARS-CoV-2 viral copies this assay can detect is 138 copies/mL. A negative result does not preclude SARS-Cov-2 infection and should not be used as the sole basis for treatment or other patient management decisions. A negative result may occur with  improper specimen collection/handling, submission of specimen other than nasopharyngeal swab, presence of viral mutation(s) within the areas targeted by this assay, and inadequate number of viral copies(<138 copies/mL). A negative result must be combined with clinical observations, patient history, and epidemiological information. The expected result is Negative.  Fact Sheet for Patients:  EntrepreneurPulse.com.au  Fact Sheet for Healthcare Providers:  IncredibleEmployment.be  This test is no t yet approved or cleared by the [place] FDA and  has been authorized for detection and/or diagnosis of  SARS-CoV-2 by FDA under an Emergency Use Authorization (EUA). This EUA will remain  in effect (meaning this test can be used) for the duration of the COVID-19  declaration under Section 564(b)(1) of the Act, 21 U.S.C.section 360bbb-3(b)(1), unless the authorization is terminated  or revoked sooner.       Influenza A by PCR NEGATIVE NEGATIVE Final   Influenza B by PCR NEGATIVE NEGATIVE Final    Comment: (NOTE) The Xpert Xpress SARS-CoV-2/FLU/RSV plus assay is intended as an aid in the diagnosis of influenza from Nasopharyngeal swab specimens and should not be used as a sole basis for treatment. Nasal washings and aspirates are unacceptable for Xpert Xpress SARS-CoV-2/FLU/RSV testing.  Fact Sheet for Patients: EntrepreneurPulse.com.au  Fact Sheet for Healthcare Providers: IncredibleEmployment.be  This test is not yet approved or cleared by the [place] FDA and has been authorized for detection and/or diagnosis of SARS-CoV-2 by FDA under an Emergency Use Authorization (EUA). This EUA will remain in effect (meaning this test can be used) for the duration of the COVID-19 declaration under Section 564(b)(1) of the Act, 21 U.S.C. section 360bbb-3(b)(1), unless the authorization is terminated or revoked.  Performed at [facility], [place] [address], [place], [place] [address]   Culture, blood (routine x 2)     Status: None   Collection Time: 05/14/22  6:23 AM   Specimen: BLOOD  Result Value Ref Range Status   Specimen Description BLOOD RIGHT ANTECUBITAL  Final   Special Requests   Final    BOTTLES DRAWN AEROBIC AND ANAEROBIC Blood Culture results may not be optimal due to an excessive volume of blood received in culture bottles   Culture   Final    NO GROWTH 5 DAYS Performed at [facility], [place] [address], [place], [place] [address]    Report Status 05/19/2022 FINAL  Final  Culture, blood (routine x 2)     Status: None   Collection Time: 05/14/22  6:25 AM   Specimen: BLOOD  Result Value Ref Range Status   Specimen Description BLOOD LEFT ANTECUBITAL  Final   Special Requests   Final     BOTTLES DRAWN AEROBIC AND ANAEROBIC Blood Culture results may not be optimal due to an excessive volume of blood received in culture bottles   Culture   Final    NO GROWTH 5 DAYS Performed at [facility], [place] [address] [place], [place] [address]    Report Status 05/19/2022 FINAL  Final  Urine Culture     Status: None   Collection Time: 05/14/22  7:57 AM   Specimen: Urine, Clean Catch  Result Value Ref Range Status   Specimen Description URINE, CLEAN CATCH  Final   Special Requests NONE  Final   Culture   Final    NO GROWTH Performed at [facility], [place] [address], [place], [place] [address]    Report Status 05/15/2022 FINAL  Final  Urine Culture     Status: Abnormal   Collection Time: 05/17/22  6:17 PM   Specimen: In/Out Cath Urine  Result Value Ref Range Status   Specimen Description IN/OUT CATH URINE  Final   Special Requests   Final    NONE Performed at [facility], [place] [address] [place], [place] [address]    Culture >=100,000 COLONIES/mL PSEUDOMONAS AERUGINOSA (A)  Final   Report Status 05/19/2022 FINAL  Final   Organism ID, Bacteria PSEUDOMONAS AERUGINOSA (A)  Final  Susceptibility   Pseudomonas aeruginosa - MIC*    CEFTAZIDIME 2 SENSITIVE Sensitive     CIPROFLOXACIN <=0.25 SENSITIVE Sensitive     GENTAMICIN 2 SENSITIVE Sensitive     IMIPENEM 2 SENSITIVE Sensitive     PIP/TAZO <=4 SENSITIVE Sensitive     CEFEPIME 2 SENSITIVE Sensitive     * >=100,000 COLONIES/mL PSEUDOMONAS AERUGINOSA    Radiology Studies: No results found.    Phillips Climes MD [facility]  If 7PM-7AM, please contact night-coverage [email or web address] 05/20/2022, 8:18 AM

## 2022-05-20 NOTE — TOC Progression Note (Addendum)
Transition of Care [facility]) - Progression Note    Patient Details  Name: [name] MRN: [id number] Date of Birth: 07/12/1931  [facility] CM/SW [place], RN Phone Number: 05/20/2022, 3:34 PM  Clinical Narrative:     Spoke to son regarding transition needs. Son has concerns regarding the home 02 concentrator from [employer]. Spoke to [place] with [employer] and the concentrator will be replaced. Portable tank ordered for discharge. PFT has been done and order for NIV placed on the chart for MD to sign.  TOC will continue to follow for needs.  Please send prescription to [place].  Need home health PT/OT/aide orders  1645 MD signed NIV order and this RNCM emailed to [facility] with [employer]  Expected Discharge Plan: [place] Barriers to Discharge: Continued Medical Work up  Expected Discharge Plan and Services Expected Discharge Plan: [place]   Discharge Planning Services: CM Consult Post Acute Care Choice: [place] arrangements for the past 2 months: Single Family Home                 DME Arranged: [facility] DME Agency: [employer]       HH Arranged: PT, OT, Nurse's Aide HH Agency: [facility] Contacted: 05/16/22 Time HH Agency Contacted: 1157 Representative spoke with at [place]: [place] (SDOH) Interventions    Readmission Risk Interventions    11/07/2020    3:49 PM  Readmission Risk Prevention Plan  Transportation Screening Complete  PCP or Specialist Appt within 3-5 Days Complete  HRI or [place] Patient refused  Social Work Consult for [place] Planning/Counseling Complete  Palliative Care Screening Not Applicable

## 2022-05-20 NOTE — Progress Notes (Signed)
[name] continues to exhibit signs of chronic respiratory failure with hypoxia due to COPD.  The use of the NIV will treat patient's high PC02 levels and ventilatory defects (FVC 44% on 05/20/22) and can reduce risk of exacerbations and future hospitalizations when used at night and during the day.  All alternate devices [phone] and [id number]) have been considered and ruled out as volume requirements are not met by BiLevel devices.  An NIV with volume-targeted pressure support is necessary to prevent patient from life-threatening harm.  Interruption or failure to provide NIV would quickly lead to exacerbation of the patient's condition, hospital re-admission, and likely harm to the patient. Continued use is preferred.  Patient is able to protect their airways and clear secretions on their own  Phillips Climes MD

## 2022-05-20 NOTE — Progress Notes (Signed)
Occupational Therapy Treatment Patient Details Name: [name] MRN: [id number] DOB: [birth year] Today's Date: 05/20/2022   History of present illness Pt is an 88 year old woman admitted on 05/14/22 after falling out of bed, with hypoxia, weakness and dizziness. Recent UTI.PMH: CKD, COPD on 3L 02, CHF, pericardial effusion, HTN, anasarca, DM, morbid obesity.   OT comments  Pt assisted to EOB with min assist and transferred to chair with RW and min guard assist. Pt self cues hand placement after first sit to stand. Less anxious today with mobility. Needing assist for LB dressing, min assist for front opening gown. Pt self feeding in chair independently. Son stating he feels comfortable physically assisting pt at home as she is close to her baseline in mobility.    Recommendations for follow up therapy are one component of a multi-disciplinary discharge planning process, led by the attending physician.  Recommendations may be updated based on patient status, additional functional criteria and insurance authorization.    Follow Up Recommendations  Home health OT    Assistance Recommended at Discharge Frequent or constant Supervision/Assistance  Patient can return home with the following  A little help with walking and/or transfers;A lot of help with bathing/dressing/bathroom;Assistance with cooking/housework;Assist for transportation;Help with stairs or ramp for entrance   Equipment Recommendations  None recommended by OT    Recommendations for Other Services      Precautions / Restrictions Precautions Precautions: Fall Precaution Comments: pt with Sp02>90% on 3L throughout Restrictions Weight Bearing Restrictions: No       Mobility Bed Mobility Overal bed mobility: Needs Assistance Bed Mobility: Supine to Sit     Supine to sit: Min assist, HOB elevated     General bed mobility comments: +use of rail, assist for hips to EOB, pt with posterior bias until feet supported on floor     Transfers Overall transfer level: Needs assistance Equipment used: Rolling walker (2 wheels) Transfers: Sit to/from Stand, Bed to chair/wheelchair/BSC Sit to Stand: Min guard     Step pivot transfers: Min guard     General transfer comment: cues for hand placement with first stand, self cued with subsequent stands, less anxious with transfer this visit     Balance Overall balance assessment: Needs assistance   Sitting balance-Leahy Scale: Fair Sitting balance - Comments: posterior lean intially, progressed to fair with feet on floor   Standing balance support: Bilateral upper extremity supported Standing balance-Leahy Scale: Poor Standing balance comment: reliant on RW and min guard assist                           ADL either performed or assessed with clinical judgement   ADL Overall ADL's : Needs assistance/impaired Eating/Feeding: Independent;Sitting   Grooming: Wash/dry hands;Sitting;Set up           Upper Body Dressing : Minimal assistance;Sitting Upper Body Dressing Details (indicate cue type and reason): front opening gown                        Extremity/Trunk Assessment              Vision       Perception     Praxis      Cognition Arousal/Alertness: Awake/alert Behavior During Therapy: WFL for tasks assessed/performed, Flat affect Overall Cognitive Status: Within Functional Limits for tasks assessed  Exercises      Shoulder Instructions       General Comments      Pertinent Vitals/ Pain       Pain Assessment Pain Assessment: Faces Faces Pain Scale: No hurt  Home Living                                          Prior Functioning/Environment              Frequency  Min 2X/week        Progress Toward Goals  OT Goals(current goals can now be found in the care plan section)  Progress towards OT goals: Progressing toward  goals  Acute Rehab OT Goals OT Goal Formulation: With patient Time For Goal Achievement: 05/29/22 Potential to Achieve Goals: Good  Plan Discharge plan remains appropriate    Co-evaluation                 AM-PAC OT "6 Clicks" Daily Activity     Outcome Measure   Help from another person eating meals?: None Help from another person taking care of personal grooming?: A Little Help from another person toileting, which includes using toliet, bedpan, or urinal?: A Lot Help from another person bathing (including washing, rinsing, drying)?: A Lot Help from another person to put on and taking off regular upper body clothing?: A Little Help from another person to put on and taking off regular lower body clothing?: A Lot 6 Click Score: 16    End of Session Equipment Utilized During Treatment: Rolling walker (2 wheels);Gait belt;Oxygen (3L)  OT Visit Diagnosis: Unsteadiness on feet (R26.81);Other abnormalities of gait and mobility (R26.89)   Activity Tolerance Patient tolerated treatment well   Patient Left in chair;with call bell/phone within reach;with chair alarm set;with family/visitor present   Nurse Communication Other (comment) (IV out)        Time: 7116-5790 OT Time Calculation (min): 45 min  Charges: OT General Charges $OT Visit: 1 Visit OT Treatments $Self Care/Home Management : 8-22 mins  [name], OTR/L Acute Rehabilitation Services Office: [phone]   [name] 05/20/2022, 9:57 AM

## 2022-05-20 NOTE — Progress Notes (Signed)
Physical Therapy Treatment Patient Details Name: NORENA BRATTON MRN: [id number] DOB: 1931/03/21 Today's Date: 05/20/2022   History of Present Illness Pt is an 88 year old woman admitted on 05/14/22 after falling out of bed, with hypoxia, weakness and dizziness. Recent UTI.PMH: CKD, COPD on 3L 02, CHF, pericardial effusion, HTN, anasarca, DM, morbid obesity.    PT Comments    Pt's son present throughout session, very attentive to his mother's needs and involved in planning for her return home. Pt did very well today with her functional mobility and was less anxious in standing and with transfers today as compared to previous session. She did not demonstrate any signs/symptoms of pain in either LE in standing. Pt would continue to benefit from skilled physical therapy services at this time while admitted and after d/c to address the below listed limitations in order to improve overall safety and independence with functional mobility.   Recommendations for follow up therapy are one component of a multi-disciplinary discharge planning process, led by the attending physician.  Recommendations may be updated based on patient status, additional functional criteria and insurance authorization.  Follow Up Recommendations  Home health PT     Assistance Recommended at Discharge Frequent or constant Supervision/Assistance  Patient can return home with the following A little help with walking and/or transfers;A little help with bathing/dressing/bathroom;Assistance with cooking/housework;Direct supervision/assist for medications management;Assist for transportation;Help with stairs or ramp for entrance   Equipment Recommendations  None recommended by PT    Recommendations for Other Services       Precautions / Restrictions Precautions Precautions: Fall Precaution Comments: pt with Sp02>90% on 3L throughout Restrictions Weight Bearing Restrictions: No     Mobility  Bed Mobility Overal bed mobility:  Needs Assistance Bed Mobility: Supine to Sit     Supine to sit: Min assist, HOB elevated     General bed mobility comments: +use of rail, assist for hips to EOB, pt with posterior bias until feet supported on floor    Transfers Overall transfer level: Needs assistance Equipment used: Rolling walker (2 wheels) Transfers: Sit to/from Stand, Bed to chair/wheelchair/BSC Sit to Stand: Min guard   Step pivot transfers: Min guard       General transfer comment: cues for hand placement with first stand, self cued with subsequent stands, less anxious with transfer this visit    Ambulation/Gait               General Gait Details: pt and pt's son reporting that she only performs transfers at home   Stairs             Wheelchair Mobility    Modified Rankin (Stroke Patients Only)       Balance Overall balance assessment: Needs assistance Sitting-balance support: Feet supported, Bilateral upper extremity supported Sitting balance-Leahy Scale: Fair Sitting balance - Comments: posterior lean intially, progressed to fair with feet on floor Postural control: Posterior lean Standing balance support: Bilateral upper extremity supported Standing balance-Leahy Scale: Poor Standing balance comment: reliant on RW and min guard assist                            Cognition Arousal/Alertness: Awake/alert Behavior During Therapy: WFL for tasks assessed/performed, Flat affect Overall Cognitive Status: Within Functional Limits for tasks assessed  General Comments: remains fearful of falling        Exercises      General Comments        Pertinent Vitals/Pain Pain Assessment Pain Assessment: Faces Faces Pain Scale: No hurt    Home Living                          Prior Function            PT Goals (current goals can now be found in the care plan section) Acute Rehab PT Goals PT Goal Formulation:  With patient Time For Goal Achievement: 05/29/22 Potential to Achieve Goals: Good Progress towards PT goals: Progressing toward goals    Frequency    Min 3X/week      PT Plan Current plan remains appropriate    Co-evaluation PT/OT/SLP Co-Evaluation/Treatment: Yes Reason for Co-Treatment: To address functional/ADL transfers;For patient/therapist safety PT goals addressed during session: Mobility/safety with mobility;Balance;Proper use of DME;Strengthening/ROM        AM-PAC PT "6 Clicks" Mobility   Outcome Measure  Help needed turning from your back to your side while in a flat bed without using bedrails?: A Little Help needed moving from lying on your back to sitting on the side of a flat bed without using bedrails?: A Little Help needed moving to and from a bed to a chair (including a wheelchair)?: A Little Help needed standing up from a chair using your arms (e.g., wheelchair or bedside chair)?: A Little Help needed to walk in hospital room?: A Little Help needed climbing 3-5 steps with a railing? : Total 6 Click Score: 16    End of Session Equipment Utilized During Treatment: Oxygen (3L) Activity Tolerance: Patient tolerated treatment well Patient left: in chair;with call bell/phone within reach;with chair alarm set;with family/visitor present Nurse Communication: Mobility status PT Visit Diagnosis: Other abnormalities of gait and mobility (R26.89)     Time: 0822-0909 PT Time Calculation (min) (ACUTE ONLY): 47 min  Charges:  $Therapeutic Activity: 8-22 mins                     Anastasio Champion, DPT  Acute Rehabilitation Services Office [place] 05/20/2022, 11:03 AM

## 2022-05-20 NOTE — Progress Notes (Cosign Needed)
[name] continues to exhibit signs of chronic respiratory failure with hypoxia due to COPD.  The use of the NIV will treat patient's high PC02 levels and ventilatory defects (FVC 44% on 05/20/22) and can reduce risk of exacerbations and future hospitalizations when used at night and during the day.  All alternate devices [phone] and [id number]) have been considered and ruled out as volume requirements are not met by BiLevel devices.  An NIV with volume-targeted pressure support is necessary to prevent patient from life-threatening harm.  Interruption or failure to provide NIV would quickly lead to exacerbation of the patient's condition, hospital re-admission, and likely harm to the patient. Continued use is preferred.  Patient is able to protect their airways and clear secretions on their own

## 2022-05-20 NOTE — Progress Notes (Signed)
Pt on 3L of O2 nasal cannula O2 Sat 96%.  Nasal cannula removed. O2 sat 86% with pt lying in bed.

## 2022-05-20 NOTE — Inpatient Diabetes Management (Signed)
Inpatient Diabetes Program Recommendations  AACE/ADA: New Consensus Statement on Inpatient Glycemic Control (2015)  Target Ranges:  Prepandial:   less than 140 mg/dL      Peak postprandial:   less than 180 mg/dL (1-2 hours)      Critically ill patients:  140 - 180 mg/dL   Lab Results  Component Value Date   GLUCAP 114 (H) 05/20/2022   HGBA1C 6.9 (H) 05/15/2022    Review of Glycemic Control  Latest Reference Range & Units 05/19/22 08:13 05/19/22 11:57 05/19/22 16:19 05/19/22 21:06 05/20/22 08:01  Glucose-Capillary 70 - 99 mg/dL 240 (H) 209 (H) 206 (H) 243 (H) 114 (H)  (H): Data is abnormally high  Diabetes history: DM2 Outpatient Diabetes medications: Novolog 12 units TID, Lantus 30 units QHS,  Current orders for Inpatient glycemic control: Novolog 0-15 units TID, 0-5 units QHS, Semglee 20 units BID  Inpatient Diabetes Program Recommendations:    Please consider;  Novolog 3 units TID with meals if consumes at least 50%  Will continue to follow while inpatient.  Thank you, Reche Dixon, MSN, [place] Diabetes Coordinator Inpatient Diabetes Program [phone] (team pager from 8a-5p)

## 2022-05-21 ENCOUNTER — Other Ambulatory Visit (HOSPITAL_COMMUNITY): Payer: Self-pay

## 2022-05-21 ENCOUNTER — Other Ambulatory Visit: Payer: Self-pay | Admitting: Internal Medicine

## 2022-05-21 DIAGNOSIS — J9621 Acute and chronic respiratory failure with hypoxia: Secondary | ICD-10-CM | POA: Diagnosis not present

## 2022-05-21 DIAGNOSIS — I5033 Acute on chronic diastolic (congestive) heart failure: Secondary | ICD-10-CM | POA: Diagnosis not present

## 2022-05-21 DIAGNOSIS — J9622 Acute and chronic respiratory failure with hypercapnia: Secondary | ICD-10-CM | POA: Diagnosis not present

## 2022-05-21 DIAGNOSIS — G9341 Metabolic encephalopathy: Secondary | ICD-10-CM | POA: Diagnosis not present

## 2022-05-21 LAB — GLUCOSE, CAPILLARY
Glucose-Capillary: 72 mg/dL (ref 70–99)
Glucose-Capillary: 170 mg/dL — ABNORMAL HIGH (ref 70–99)

## 2022-05-21 MED ORDER — FUROSEMIDE 40 MG PO TABS
60.0000 mg | ORAL_TABLET | Freq: Two times a day (BID) | ORAL | 0 refills | Status: DC
  Filled 2022-05-21: qty 90, 30d supply, fill #0

## 2022-05-21 MED ORDER — IPRATROPIUM-ALBUTEROL 0.5-2.5 (3) MG/3ML IN SOLN: 3.0000 mL | Freq: Four times a day (QID) | RESPIRATORY_TRACT | 0 refills | Status: DC | PRN

## 2022-05-21 MED ORDER — IPRATROPIUM-ALBUTEROL 0.5-2.5 (3) MG/3ML IN SOLN
3.0000 mL | Freq: Four times a day (QID) | RESPIRATORY_TRACT | 0 refills | Status: DC | PRN
  Filled 2022-05-21: qty 360, 30d supply, fill #0

## 2022-05-21 NOTE — TOC Transition Note (Addendum)
Transition of Care [facility]) - CM/SW Discharge Note   Patient Details  Name: [name] MRN: [id number] Date of Birth: 12-11-1930  Transition of Care [facility] CM/SW Contact:  Carles Collet, RN Phone Number: 05/21/2022, 10:28 AM   Clinical Narrative:    Patient for DC to home today.  Spoke w Thedore Mins w [employer] [facility] and NIV/ BiPAP will be set up the house today once the patient gets home. [employer] active for home oxygen. Confirmed with Thedore Mins that they have replaced home oxygen concentrator today.   Nebulizer has been delivered to the room.  Meds will be filled by [place] and sent home with the patient. Alvis Lemmings will cover [facility] services, liaison aware of DC planned for today, [facility] RN will cover any respiratory needs.   16:15 Verified w Thedore Mins from [employer] that patient's son has been in contact with their RT, Thurmond Butts, who will set up NIV at the home today once patient is discharged.  Elouise Munroe to bring it to the hospital and the son declined a hospital set up, and has been texting with the son to coordinate delivery of NIV to the house.   Spoke w [place] who is having difficulties processing [facility] under insurance, Medicare B. Discussed with [employer] who can fill through B if MD E-prescribes to "[facility]" in [place]. Notified attending who is looking into options and alternative.  Med will go to [facility] and [place] notified. Meds will be delivered to the home within 24 hours of order being placed.       Final next level of care: [place] Barriers to Discharge: No Barriers Identified   Patient Goals and CMS Choice Patient states their goals for this hospitalization and ongoing recovery are:: return home CMS Medicare.gov Compare Post Acute Care list provided to:: Other (Comment Required) Choice offered to / list presented to : Adult Children (son)  Discharge Placement                       Discharge Plan and Services   Discharge  Planning Services: CM Consult Post Acute Care Choice: Home Health          DME Arranged: Nebulizer machine, NIV DME Agency: [facility] Date DME Agency Contacted: 05/21/22 Time DME Agency Contacted: 1027 Representative spoke with at DME Agency: Thedore Mins HH Arranged: PT, OT, Nurse's Aide, RN [place] Agency: [place] Date [place]: 05/21/22 Time [place]: 1028 Representative spoke with at [place]: [place] ([place]) Interventions     Readmission Risk Interventions    11/07/2020    3:49 PM  Readmission Risk Prevention Plan  Transportation Screening Complete  PCP or Specialist Appt within 3-5 Days Complete  HRI or [place] Patient refused  Social Work Consult for [place] Planning/Counseling Complete  Palliative Care Screening Not Applicable

## 2022-05-21 NOTE — Discharge Instructions (Signed)
Follow with Primary MD Hoyt Koch, MD in 7 days   Get CBC, CMP, checked  by Primary MD next visit.    Activity: As tolerated with Full fall precautions use walker/cane & assistance as needed   Disposition Home    Diet: Heart Healthy  , with feeding assistance and aspiration precautions.  For Heart failure patients - Check your Weight same time everyday, if you gain over 2 pounds, or you develop in leg swelling, experience more shortness of breath or chest pain, call your Primary MD immediately. Follow Cardiac Low Salt Diet and 1.5 lit/day fluid restriction.   On your next visit with your primary care physician please Get Medicines reviewed and adjusted.   Please request your Prim.MD to go over all Hospital Tests and Procedure/Radiological results at the follow up, please get all Hospital records sent to your Prim MD by signing hospital release before you go home.   If you experience worsening of your admission symptoms, develop shortness of breath, life threatening emergency, suicidal or homicidal thoughts you must seek medical attention immediately by calling 911 or calling your MD immediately  if symptoms less severe.  You Must read complete instructions/literature along with all the possible adverse reactions/side effects for all the Medicines you take and that have been prescribed to you. Take any new Medicines after you have completely understood and accpet all the possible adverse reactions/side effects.   Do not drive, operating heavy machinery, perform activities at heights, swimming or participation in water activities or provide baby sitting services if your were admitted for syncope or siezures until you have seen by Primary MD or a Neurologist and advised to do so again.  Do not drive when taking Pain medications.    Do not take more than prescribed Pain, Sleep and Anxiety Medications  Special Instructions: If you have smoked or chewed Tobacco  in the last 2  yrs please stop smoking, stop any regular Alcohol  and or any Recreational drug use.  Wear Seat belts while driving.   Please note  You were cared for by a hospitalist during your hospital stay. If you have any questions about your discharge medications or the care you received while you were in the hospital after you are discharged, you can call the unit and asked to speak with the hospitalist on call if the hospitalist that took care of you is not available. Once you are discharged, your primary care physician will handle any further medical issues. Please note that NO REFILLS for any discharge medications will be authorized once you are discharged, as it is imperative that you return to your primary care physician (or establish a relationship with a primary care physician if you do not have one) for your aftercare needs so that they can reassess your need for medications and monitor your lab values.

## 2022-05-21 NOTE — Discharge Summary (Signed)
Physician Discharge Summary  [name] OIN:[id number] DOB: 1931-06-06 DOA: 05/14/2022  PCP: Hoyt Koch, MD  Admit date: 05/14/2022 Discharge date: 05/21/2022  Admitted From: Home Disposition:  Home   Recommendations for Outpatient Follow-up:  Follow up with PCP in 1-2 weeks Please obtain BMP/CBC in one week   Home Health:YES Equipment/Devices: Home oxygen, Trelegy  Discharge Condition:Stable CODE STATUS:DNR Diet recommendation: Heart Healthy   Brief/Interim Summary: 88 year old F with PMH of chronic diastolic CHF, COPD, chronic hypoxic RF on 3 L, recent "UTI", CKD-3, CAD, DM-2, morbid obesity, HTN and HLD presenting with confusion and hypoxia to 65% on home 3 L.  Family reports recent diagnosis of "UTI" and she increased her Macrobid to twice daily for 7 days.  However, she had no UTI symptoms on presentation.   In ED, 100% on NRB.  Other vitals normal.  WBC 13.1 with left shift.  Hgb 10.8.  580.  Troponin 12> 17 lactic acid 0.7.  NA 134.  K5.9. Cr 1.91.  BUN 46.  AG 16.  Bicarb 24.  COVID-19 and influenza PCR nonreactive.  Portable CXR with acute pulmonary edema with trace pleural effusion.  VBG 7.23/87/172/37.  UA and cultures drawn.  TTE ordered.  Patient was started on BiPAP and IV Lasix.  Admitted for acute on chronic respiratory failure with hypoxia and hypercapnia in the setting of acute on chronic diastolic CHF, and acute metabolic encephalopathy and AKI.   The next day, patient's respiratory status, blood gas and mental status improved.  Urine and blood cultures NGTD.  Remained on IV Lasix for further diuresis.    She is also on cefepime for Pseudomonas UTI.      Acute on chronic respiratory failure with hypoxia and hypercapnia:  - Reportedly desaturated to 65% on home 3 L at home.  Elevated BNP and CXR suggests acute on chronic diastolic CHF. VBG 7.23/87/172/37 suggests obstructive etiology but she does not seem to be in COPD exacerbation on exam.  Respiratory  symptoms, oxygen requirement, blood gas improved. -Has significantly improved, back to baseline 2 to 3 L nasal cannula -She is at risk of developing hypercapnia, so NIV  trilogy has been arranged for her -Peritrate status significantly improved with IV diuresis, she is currently at baseline on 2 L nasal cannula.    Acute on chronic diastolic CHF:  -TTE with normal LVEF but G2 DD.  BNP elevated to 580.  CXR with pulmonary edema. BLE edema and BNP improved. -Volume status significantly improved, no evidence of volume overload, no JVD, no lower extremity edema. -She is -8.5 L since admission. -Diuretics will be adjusted on discharge, will discharge on Lasix 60 mg oral twice daily instead of 80 mg oral daily    Acute metabolic encephalopathy:  - Likely due to hypercapnia.  She is also on opiates, low-dose gabapentin and Ativan.  Resolved.  No focal neurodeficit. -Manage respiratory failure as above -Minimize sedating medications -Reorientation and delirium precautions   AKI/azotemia on CKD-3B: b/l Cr ~1.6.  AKI resolved. Recent Labs (within last 365 days)             Recent Labs    11/12/21 1355 01/22/22 1549 05/14/22 0507 05/15/22 0240 05/16/22 0045 05/17/22 0139 05/18/22 0739 05/19/22 0731 05/20/22 0317  BUN 44* 38* 46* 49* 47* 41* 37* 37* 37*  CREATININE 1.61* 1.47* 1.91* 1.93* 1.68* 1.63* 1.55* 1.54* 1.43*    -Continue monitoring while on diuretics.   Pseudomonas UTI: -  Patient had dysuria on 7/14.  UA  concerning for UTI.  Urine culture with Pseudomonas aeruginosa.  She seems to have recurrent UTI.  On Macrobid outpatient. -Treated with Rocephin initially, but this has been transitioned to cefepime, finished antibiotic course, no further need on discharge.   Chronic COPD: Stable -Continue Breo Ellipta. -Continue DuoNeb twice daily and as needed albuterol -Minimum oxygen to keep saturation above 88%   Controlled IDDM-2 with CKD-3B, HLD and hyperglycemia: A1c 6.9%. Last  Labs          Recent Labs  Lab 05/19/22 0813 05/19/22 1157 05/19/22 1619 05/19/22 2106 05/20/22 0801  GLUCAP 240* 209* 206* 243* 114*    Continue with home regimen   Hyponatremia: Likely hypervolemic.  Resolved.   Normocytic anemia: Hgb slightly lower.  No evidence of bleeding. Recent Labs (within last 365 days)            Recent Labs    01/22/22 1549 05/14/22 0507 05/14/22 0658 05/14/22 0904 05/15/22 0240 05/17/22 0139 05/19/22 0731 05/20/22 0317  HGB 11.1* 10.8* 12.2 10.9* 9.3* 9.4* 9.8* 9.8*    -Continue monitoring   Essential hypertension: BP within acceptable range. -Norvasc and hydralazine has been held during hospital stay as blood pressure has been acceptable, will continue to hold Norvasc on discharge especially with lower extremity edema.  Gout -Continue allopurinol   Physical deconditioning -PT/OT arranged as an outpatient   Left foot pain -No acute finding on physical exam, this is most likely related to severe degenerative disease, as her foot x-ray was noted for Severe degenerative changes are noted in first metatarsophalangeal joint. Plantar spur is seen in calcaneus, as well she was noted with irregularity and fifth metatarsal bone, her main complaint is tenderness around heel and ankle area, and this has all resolved today   Goal of care counseling/DNR   Morbid obesity: Elevated BMI with comorbid conditions as above. Body mass index is 36.03 kg/m.   Discharge Diagnoses:  Principal Problem:   Acute on chronic respiratory failure with hypoxia and hypercapnia (HCC) Active Problems:   Hypertension, essential   Mixed dyslipidemia   Diabetes with neurologic complications (HCC)   COPD (chronic obstructive pulmonary disease) (HCC)   Acute on chronic diastolic CHF (congestive heart failure) (HCC)   Acute kidney injury superimposed on chronic kidney disease ([place])   Acute metabolic encephalopathy   Morbid obesity ([place])   Suspected sleep apnea    DNR (do not resuscitate)   Hyponatremia   Normocytic anemia   Urinary tract infection due to Pseudomonas aeruginosa    Discharge Instructions  Discharge Instructions     Diet - low sodium heart healthy   Complete by: As directed    Discharge instructions   Complete by: As directed    Follow with Primary MD Hoyt Koch, MD in 7 days   Get CBC, CMP, checked  by Primary MD next visit.    Activity: As tolerated with Full fall precautions use walker/cane & assistance as needed   Disposition Home    Diet: Heart Healthy  , with feeding assistance and aspiration precautions.  For Heart failure patients - Check your Weight same time everyday, if you gain over 2 pounds, or you develop in leg swelling, experience more shortness of breath or chest pain, call your Primary MD immediately. Follow Cardiac Low Salt Diet and 1.5 lit/day fluid restriction.   On your next visit with your primary care physician please Get Medicines reviewed and adjusted.   Please request your Prim.MD to go over all Hospital Tests  and Procedure/Radiological results at the follow up, please get all Hospital records sent to your Prim MD by signing hospital release before you go home.   If you experience worsening of your admission symptoms, develop shortness of breath, life threatening emergency, suicidal or homicidal thoughts you must seek medical attention immediately by calling 911 or calling your MD immediately  if symptoms less severe.  You Must read complete instructions/literature along with all the possible adverse reactions/side effects for all the Medicines you take and that have been prescribed to you. Take any new Medicines after you have completely understood and accpet all the possible adverse reactions/side effects.   Do not drive, operating heavy machinery, perform activities at heights, swimming or participation in water activities or provide baby sitting services if your were admitted for  syncope or siezures until you have seen by Primary MD or a Neurologist and advised to do so again.  Do not drive when taking Pain medications.    Do not take more than prescribed Pain, Sleep and Anxiety Medications  Special Instructions: If you have smoked or chewed Tobacco  in the last 2 yrs please stop smoking, stop any regular Alcohol  and or any Recreational drug use.  Wear Seat belts while driving.   Please note  You were cared for by a hospitalist during your hospital stay. If you have any questions about your discharge medications or the care you received while you were in the hospital after you are discharged, you can call the unit and asked to speak with the hospitalist on call if the hospitalist that took care of you is not available. Once you are discharged, your primary care physician will handle any further medical issues. Please note that NO REFILLS for any discharge medications will be authorized once you are discharged, as it is imperative that you return to your primary care physician (or establish a relationship with a primary care physician if you do not have one) for your aftercare needs so that they can reassess your need for medications and monitor your lab values.   Increase activity slowly   Complete by: As directed       Allergies as of 05/21/2022       Reactions   Atenolol Other (See Comments)   weakness   Chlorhexidine Other (See Comments)   Unknown reaction   Codeine Nausea And Vomiting   Hydrocodone is ok   Lactose Intolerance (gi) Diarrhea   Lodine [etodolac] Other (See Comments)   dizziness   Sulfa Drugs Cross Reactors Nausea Only   Sulfamethoxazole Nausea And Vomiting   Benzonatate Nausea And Vomiting   Escitalopram Oxalate Other (See Comments)   mild hallucinations   Metformin Nausea And Vomiting   Reaction to IR and XL   Oxycodone-acetaminophen Other (See Comments)   Percocet does not relieve pts' pain   Pregabalin Other (See Comments)    Unknown reaction        Medication List     STOP taking these medications    amLODipine 5 MG tablet Commonly known as: NORVASC   cephALEXin 500 MG capsule Commonly known as: KEFLEX   nitrofurantoin (macrocrystal-monohydrate) 100 MG capsule Commonly known as: Macrobid   oxyCODONE-acetaminophen 5-325 MG tablet Commonly known as: PERCOCET/ROXICET       TAKE these medications    acetaminophen 500 MG tablet Commonly known as: TYLENOL Take 500 mg by mouth 2 (two) times daily as needed for mild pain or headache.   albuterol 108 (90 Base) MCG/ACT  inhaler Commonly known as: VENTOLIN HFA Inhale 2 puffs into the lungs every 6 (six) hours as needed for wheezing or shortness of breath.   allopurinol 100 MG tablet Commonly known as: ZYLOPRIM TAKE 1 TABLET BY MOUTH EVERY DAY What changed: when to take this   ALPRAZolam 0.25 MG tablet Commonly known as: XANAX TAKE 1 TABLET BY MOUTH AT BEDTIME AS NEEDED FOR ANXIETY What changed: See the new instructions.   aspirin EC 81 MG tablet Take 81 mg by mouth every morning.   atorvastatin 40 MG tablet Commonly known as: LIPITOR TAKE 1 TABLET ONCE A DAY FOR CHOLESTEROL What changed: See the new instructions.   diclofenac Sodium 1 % Gel Commonly known as: VOLTAREN Apply 4 g topically 4 (four) times daily. To affected joint. What changed:  how much to take when to take this additional instructions   ergocalciferol 1.25 MG (50000 UT) capsule Commonly known as: VITAMIN D2 Take 1 capsule (50,000 Units total) by mouth every Friday.   fluticasone 50 MCG/ACT nasal spray Commonly known as: FLONASE Place 2 sprays into both nostrils daily. What changed:  how much to take when to take this   fluticasone furoate-vilanterol 200-25 MCG/ACT Aepb Commonly known as: BREO ELLIPTA Inhale 1 puff into the lungs daily. What changed: when to take this   furosemide 40 MG tablet Commonly known as: Lasix Take 1.5 tablets (60 mg total) by  mouth 2 (two) times daily. What changed:  medication strength how much to take when to take this additional instructions   gabapentin 100 MG capsule Commonly known as: NEURONTIN TAKE 1 CAPSULE BY MOUTH TWICE A DAY   hydrALAZINE 25 MG tablet Commonly known as: APRESOLINE Take 1 tablet (25 mg total) by mouth 2 (two) times daily.   insulin aspart 100 UNIT/ML injection Commonly known as: novoLOG Inject 12 Units into the skin 3 (three) times daily with meals.   insulin glargine 100 UNIT/ML injection Commonly known as: Lantus Inject 0.25 mLs (25 Units total) into the skin at bedtime. What changed: how much to take   LACTASE PO Take 1 tablet by mouth every morning.   metoprolol tartrate 25 MG tablet Commonly known as: LOPRESSOR TAKE 1 TABLET BY MOUTH TWICE A DAY   multivitamin with minerals Tabs tablet Take 1 tablet by mouth every morning.   ONE TOUCH ULTRA TEST test strip Generic drug: glucose blood 1 each by Other route as directed.   OXYGEN Inhale 3 L into the lungs continuous.   Polyethyl Glycol-Propyl Glycol 0.4-0.3 % Soln Place 2 drops into both eyes daily as needed (dry eyes). Systane   PROBIOTIC DAILY PO Take 1 tablet by mouth every morning.               Durable Medical Equipment  (From admission, onward)           Start     Ordered   05/20/22 1111  For home use only DME Nebulizer machine  Once       Question Answer Comment  Patient needs a nebulizer to treat with the following condition COPD (chronic obstructive pulmonary disease) ([place])   Length of Need Lifetime      05/20/22 1112            Follow-up Information     [facility] Follow up.   Specialty: [place] Why: Home Health agency Contact information: [place] [address] [phone]  Hoyt Koch, MD Follow up.   Specialty: Internal Medicine Why: Patient has a appointment for follow up Contact  information: [place] [address] [phone]                Allergies  Allergen Reactions   Atenolol Other (See Comments)    weakness   Chlorhexidine Other (See Comments)    Unknown reaction   Codeine Nausea And Vomiting    Hydrocodone is ok   Lactose Intolerance (Gi) Diarrhea   Lodine [Etodolac] Other (See Comments)    dizziness   Sulfa Drugs Cross Reactors Nausea Only   Sulfamethoxazole Nausea And Vomiting   Benzonatate Nausea And Vomiting   Escitalopram Oxalate Other (See Comments)    mild hallucinations   Metformin Nausea And Vomiting    Reaction to IR and XL   Oxycodone-Acetaminophen Other (See Comments)    Percocet does not relieve pts' pain   Pregabalin Other (See Comments)    Unknown reaction    Consultations: None   Procedures/Studies: DG Foot 2 Views Left  Result Date: 05/20/2022 CLINICAL DATA:  Pain EXAM: LEFT FOOT - 2 VIEW COMPARISON:  None Available. FINDINGS: Severe degenerative changes are noted in first metatarsophalangeal joint with joint space narrowing, bony spurs and erosive changes. No recent fracture or dislocation is seen. Osteopenia is seen in the bony structures. Deformity seen in the neck of fourth metatarsal may be residual from previous injury. There is minimal cortical irregularity in the lateral margin of head of the fifth metatarsal. Small plantar spur is seen in calcaneus. Bony spurs are noted in the dorsal aspects of intertarsal and tarsometatarsal joints. The soft tissue swelling over the dorsum and around the ankle. Arterial calcifications are seen in soft tissues. IMPRESSION: No recent displaced fracture or dislocation is seen. There is minimal cortical irregularity in the lateral margin of head of the left fifth metatarsal which may suggest recent or old undisplaced fracture. Old healed fracture is seen in the neck of the left fourth metatarsal. Severe degenerative changes are noted in first metatarsophalangeal  joint. Plantar spur is seen in calcaneus. Electronically Signed   By: Elmer Picker M.D.   On: 05/20/2022 11:15   VAS [place] LOWER EXTREMITY VENOUS (DVT)  Result Date: 05/16/2022  Lower Venous DVT Study Patient Name:  CIRCE CHILTON [facility]  Date of Exam:   05/16/2022 Medical Rec #: [id number]     Accession #:    [id number] Date of Birth: 1931-06-24      Patient Gender: F Patient Age:   61 years Exam Location:  [facility] Procedure:      VAS [place] LOWER EXTREMITY VENOUS (DVT) Referring Phys: Bretta Bang GONFA --------------------------------------------------------------------------------  Comparison Study: 12/15/20, negative for DVT. Performing Technologist: Bobetta Lime BS, RVT  Examination Guidelines: A complete evaluation includes B-mode imaging, spectral Doppler, color Doppler, and power Doppler as needed of all accessible portions of each vessel. Bilateral testing is considered an integral part of a complete examination. Limited examinations for reoccurring indications may be performed as noted. The reflux portion of the exam is performed with the patient in reverse Trendelenburg.  +---------+---------------+---------+-----------+----------+--------------+ RIGHT    CompressibilityPhasicitySpontaneityPropertiesThrombus Aging +---------+---------------+---------+-----------+----------+--------------+ CFV      Full           Yes      Yes                                 +---------+---------------+---------+-----------+----------+--------------+  SFJ      Full                                                        +---------+---------------+---------+-----------+----------+--------------+ FV Prox  Full                                                        +---------+---------------+---------+-----------+----------+--------------+ FV Mid   Full                                                        +---------+---------------+---------+-----------+----------+--------------+ FV DistalFull                                                         +---------+---------------+---------+-----------+----------+--------------+ PFV      Full                                                        +---------+---------------+---------+-----------+----------+--------------+ POP      Full           Yes      Yes                                 +---------+---------------+---------+-----------+----------+--------------+ PTV      Full                                                        +---------+---------------+---------+-----------+----------+--------------+ PERO     Full                                                        +---------+---------------+---------+-----------+----------+--------------+   +---------+---------------+---------+-----------+----------+--------------+ LEFT     CompressibilityPhasicitySpontaneityPropertiesThrombus Aging +---------+---------------+---------+-----------+----------+--------------+ CFV      Full           Yes      Yes                                 +---------+---------------+---------+-----------+----------+--------------+ SFJ      Full                                                        +---------+---------------+---------+-----------+----------+--------------+  FV Prox  Full                                                        +---------+---------------+---------+-----------+----------+--------------+ FV Mid   Full                                                        +---------+---------------+---------+-----------+----------+--------------+ FV DistalFull                                                        +---------+---------------+---------+-----------+----------+--------------+ PFV      Full                                                        +---------+---------------+---------+-----------+----------+--------------+ POP      Full           Yes      Yes                                  +---------+---------------+---------+-----------+----------+--------------+ PTV      Full                                                        +---------+---------------+---------+-----------+----------+--------------+ PERO     Full                                                        +---------+---------------+---------+-----------+----------+--------------+     Summary: BILATERAL: - No evidence of deep vein thrombosis seen in the lower extremities, bilaterally. -No evidence of popliteal cyst, bilaterally.   *See table(s) above for measurements and observations. Electronically signed by Orlie Pollen on 05/16/2022 at 4:47:30 PM.    Final    ECHOCARDIOGRAM COMPLETE  Result Date: 05/14/2022    ECHOCARDIOGRAM REPORT   Patient Name:   RHEA KAELIN Date of Exam: 05/14/2022 Medical Rec #:  [id number]    Height:       64.0 in Accession #:    [id number]   Weight:       209.0 lb Date of Birth:  1931-06-30     BSA:          1.993 m Patient Age:    52 years     BP:           140/64 mmHg Patient Gender: F            HR:  65 bpm. Exam Location:  Inpatient Procedure: 2D Echo and Intracardiac Opacification Agent Indications:    acute systolic chf  History:        Patient has prior history of Echocardiogram examinations, most                 recent 11/03/2020. CHF, CAD, COPD and chronic kidney disease;                 Risk Factors:Diabetes, Dyslipidemia and Hypertension.  Sonographer:    Johny Chess RDCS Referring Phys: Karmen Bongo  Sonographer Comments: Image acquisition challenging due to patient body habitus and Image acquisition challenging due to respiratory motion. IMPRESSIONS  1. Left ventricular ejection fraction, by estimation, is 60 to 65%. The left ventricle has normal function. The left ventricle has no regional wall motion abnormalities. There is mild concentric left ventricular hypertrophy. Left ventricular diastolic parameters are consistent with Grade II diastolic  dysfunction (pseudonormalization). Elevated left atrial pressure.  2. Right ventricular systolic function is normal. The right ventricular size is normal. Tricuspid regurgitation signal is inadequate for assessing PA pressure.  3. Left atrial size was moderately dilated.  4. The mitral valve is degenerative. Trivial mitral valve regurgitation. No evidence of mitral stenosis. Moderate mitral annular calcification.  5. The aortic valve is tricuspid. There is mild calcification of the aortic valve. There is mild thickening of the aortic valve. Aortic valve regurgitation is not visualized. Aortic valve sclerosis/calcification is present, without any evidence of aortic stenosis.  6. The inferior vena cava is normal in size with greater than 50% respiratory variability, suggesting right atrial pressure of 3 mmHg. Comparison(s): Prior images reviewed side by side. There is evidence of high mean left atrial pressure, based on E/e' ratio. FINDINGS  Left Ventricle: Left ventricular ejection fraction, by estimation, is 60 to 65%. The left ventricle has normal function. The left ventricle has no regional wall motion abnormalities. Definity contrast agent was given IV to delineate the left ventricular  endocardial borders. The left ventricular internal cavity size was normal in size. There is mild concentric left ventricular hypertrophy. Left ventricular diastolic parameters are consistent with Grade II diastolic dysfunction (pseudonormalization). Elevated left atrial pressure. Right Ventricle: The right ventricular size is normal. No increase in right ventricular wall thickness. Right ventricular systolic function is normal. Tricuspid regurgitation signal is inadequate for assessing PA pressure. Left Atrium: Left atrial size was moderately dilated. Right Atrium: Right atrial size was normal in size. Pericardium: There is no evidence of pericardial effusion. Mitral Valve: The mitral valve is degenerative in appearance. There is  mild thickening of the mitral valve leaflet(s). Moderate mitral annular calcification. Trivial mitral valve regurgitation. No evidence of mitral valve stenosis. Tricuspid Valve: The tricuspid valve is normal in structure. Tricuspid valve regurgitation is trivial. Aortic Valve: The aortic valve is tricuspid. There is mild calcification of the aortic valve. There is mild thickening of the aortic valve. Aortic valve regurgitation is not visualized. Aortic valve sclerosis/calcification is present, without any evidence of aortic stenosis. Pulmonic Valve: The pulmonic valve was grossly normal. Pulmonic valve regurgitation is trivial. Aorta: The aortic root and ascending aorta are structurally normal, with no evidence of dilitation. Venous: The inferior vena cava is normal in size with greater than 50% respiratory variability, suggesting right atrial pressure of 3 mmHg. IAS/Shunts: No atrial level shunt detected by color flow Doppler.  LEFT VENTRICLE PLAX 2D LVIDd:         4.80 cm   Diastology LVIDs:  2.80 cm   LV e' medial:    6.85 cm/s LV PW:         1.20 cm   LV E/e' medial:  16.1 LV IVS:        1.00 cm   LV e' lateral:   7.29 cm/s LVOT diam:     1.80 cm   LV E/e' lateral: 15.1 LV SV:         62 LV SV Index:   31 LVOT Area:     2.54 cm  RIGHT VENTRICLE            IVC RV S prime:     7.72 cm/s  IVC diam: 2.00 cm TAPSE (M-mode): 1.4 cm LEFT ATRIUM             Index        RIGHT ATRIUM           Index LA diam:        4.60 cm 2.31 cm/m   RA Area:     18.50 cm LA Vol (A2C):   51.3 ml 25.74 ml/m  RA Volume:   51.00 ml  25.59 ml/m LA Vol (A4C):   75.6 ml 37.93 ml/m LA Biplane Vol: 65.0 ml 32.61 ml/m  AORTIC VALVE LVOT Vmax:   116.00 cm/s LVOT Vmean:  77.400 cm/s LVOT VTI:    0.242 m  AORTA Ao Root diam: 2.90 cm Ao Asc diam:  2.90 cm MITRAL VALVE MV Area (PHT): 3.42 cm     SHUNTS MV Decel Time: 222 msec     Systemic VTI:  0.24 m MV E velocity: 110.00 cm/s  Systemic Diam: 1.80 cm MV A velocity: 127.00 cm/s MV E/A  ratio:  0.87 Mihai Croitoru MD Electronically signed by Sanda Klein MD Signature Date/Time: 05/14/2022/6:11:10 PM    Final    DG Chest 1 View  Result Date: 05/14/2022 CLINICAL DATA:  88 year old female with shortness of breath. EXAM: CHEST  1 VIEW COMPARISON:  Portable chest 11/03/2020 and earlier. FINDINGS: AP view at 0512 hours. Slightly lower lung volumes. Stable cardiac size at the upper limits of normal. Other mediastinal contours are within normal limits. Diffuse pulmonary vascular congestion with indistinct appearance of the vasculature, Kerley B lines in the right lung. Trace right minor fissure pleural fluid. No pneumothorax or consolidation. No layering pleural effusion. No acute osseous abnormality identified. Negative visible bowel gas. IMPRESSION: Acute Pulmonary Edema with trace pleural fluid. Electronically Signed   By: Genevie Ann M.D.   On: 05/14/2022 05:59      Subjective: Significant events overnight as discussed with staff, denies cough, fever or chills.  No left foot pain.  Discharge Exam: Vitals:   05/21/22 0749 05/21/22 1148  BP: 130/67 121/65  Pulse: 82 66  Resp: 19 13  Temp: 98 F (36.7 C) 98.5 F (36.9 C)  SpO2: 93% 93%   Vitals:   05/21/22 0400 05/21/22 0727 05/21/22 0749 05/21/22 1148  BP: (!) 142/53  130/67 121/65  Pulse: 76  82 66  Resp: '18  19 13  '$ Temp: 97.9 F (36.6 C)  98 F (36.7 C) 98.5 F (36.9 C)  TempSrc: Temporal  Axillary Oral  SpO2: (!) 86% 95% 93% 93%  Weight:      Height:        General: Pt is alert, awake, not in acute distress Cardiovascular: RRR, S1/S2 +, no rubs, no gallops Respiratory: CTA bilaterally, no wheezing, no rhonchi Abdominal: Soft, NT, ND, bowel sounds + Extremities:  no edema, no cyanosis    The results of significant diagnostics from this hospitalization (including imaging, microbiology, ancillary and laboratory) are listed below for reference.     Microbiology: Recent Results (from the past 240 hour(s))  Resp  Panel by RT-PCR (Flu A&B, Covid) Anterior Nasal Swab     Status: None   Collection Time: 05/14/22  6:23 AM   Specimen: Anterior Nasal Swab  Result Value Ref Range Status   SARS Coronavirus 2 by RT PCR NEGATIVE NEGATIVE Final    Comment: (NOTE) SARS-CoV-2 target nucleic acids are NOT DETECTED.  The SARS-CoV-2 RNA is generally detectable in upper respiratory specimens during the acute phase of infection. The lowest concentration of SARS-CoV-2 viral copies this assay can detect is 138 copies/mL. A negative result does not preclude SARS-Cov-2 infection and should not be used as the sole basis for treatment or other patient management decisions. A negative result may occur with  improper specimen collection/handling, submission of specimen other than nasopharyngeal swab, presence of viral mutation(s) within the areas targeted by this assay, and inadequate number of viral copies(<138 copies/mL). A negative result must be combined with clinical observations, patient history, and epidemiological information. The expected result is Negative.  Fact Sheet for Patients:  EntrepreneurPulse.com.au  Fact Sheet for Healthcare Providers:  IncredibleEmployment.be  This test is no t yet approved or cleared by the [place] FDA and  has been authorized for detection and/or diagnosis of SARS-CoV-2 by FDA under an Emergency Use Authorization (EUA). This EUA will remain  in effect (meaning this test can be used) for the duration of the COVID-19 declaration under Section 564(b)(1) of the Act, 21 U.S.C.section 360bbb-3(b)(1), unless the authorization is terminated  or revoked sooner.       Influenza A by PCR NEGATIVE NEGATIVE Final   Influenza B by PCR NEGATIVE NEGATIVE Final    Comment: (NOTE) The Xpert Xpress SARS-CoV-2/FLU/RSV plus assay is intended as an aid in the diagnosis of influenza from Nasopharyngeal swab specimens and should not be used as a sole  basis for treatment. Nasal washings and aspirates are unacceptable for Xpert Xpress SARS-CoV-2/FLU/RSV testing.  Fact Sheet for Patients: EntrepreneurPulse.com.au  Fact Sheet for Healthcare Providers: IncredibleEmployment.be  This test is not yet approved or cleared by the [place] FDA and has been authorized for detection and/or diagnosis of SARS-CoV-2 by FDA under an Emergency Use Authorization (EUA). This EUA will remain in effect (meaning this test can be used) for the duration of the COVID-19 declaration under Section 564(b)(1) of the Act, 21 U.S.C. section 360bbb-3(b)(1), unless the authorization is terminated or revoked.  Performed at [facility], [place] [address] [place], [place] [address]   Culture, blood (routine x 2)     Status: None   Collection Time: 05/14/22  6:23 AM   Specimen: BLOOD  Result Value Ref Range Status   Specimen Description BLOOD RIGHT ANTECUBITAL  Final   Special Requests   Final    BOTTLES DRAWN AEROBIC AND ANAEROBIC Blood Culture results may not be optimal due to an excessive volume of blood received in culture bottles   Culture   Final    NO GROWTH 5 DAYS Performed at [facility], [place] [address], [place], [place] [address]    Report Status 05/19/2022 FINAL  Final  Culture, blood (routine x 2)     Status: None   Collection Time: 05/14/22  6:25 AM   Specimen: BLOOD  Result Value Ref Range Status  Specimen Description BLOOD LEFT ANTECUBITAL  Final   Special Requests   Final    BOTTLES DRAWN AEROBIC AND ANAEROBIC Blood Culture results may not be optimal due to an excessive volume of blood received in culture bottles   Culture   Final    NO GROWTH 5 DAYS Performed at [facility], [place] [address], [place], [place] [address]    Report Status 05/19/2022 FINAL  Final  Urine Culture     Status: None   Collection Time: 05/14/22  7:57 AM   Specimen: Urine, Clean Catch  Result Value Ref  Range Status   Specimen Description URINE, CLEAN CATCH  Final   Special Requests NONE  Final   Culture   Final    NO GROWTH Performed at [facility], [place] [address], [place], [place] [address]    Report Status 05/15/2022 FINAL  Final  Urine Culture     Status: Abnormal   Collection Time: 05/17/22  6:17 PM   Specimen: In/Out Cath Urine  Result Value Ref Range Status   Specimen Description IN/OUT CATH URINE  Final   Special Requests   Final    NONE Performed at [facility], [place] [address], [place], [place] [address]    Culture >=100,000 COLONIES/mL PSEUDOMONAS AERUGINOSA (A)  Final   Report Status 05/19/2022 FINAL  Final   Organism ID, Bacteria PSEUDOMONAS AERUGINOSA (A)  Final      Susceptibility   Pseudomonas aeruginosa - MIC*    CEFTAZIDIME 2 SENSITIVE Sensitive     CIPROFLOXACIN <=0.25 SENSITIVE Sensitive     GENTAMICIN 2 SENSITIVE Sensitive     IMIPENEM 2 SENSITIVE Sensitive     PIP/TAZO <=4 SENSITIVE Sensitive     CEFEPIME 2 SENSITIVE Sensitive     * >=100,000 COLONIES/mL PSEUDOMONAS AERUGINOSA     Labs: BNP (last 3 results) Recent Labs    05/17/22 0139 05/18/22 0739 05/19/22 0731  BNP 201.9* 105.0* 32.6   Basic Metabolic Panel: Recent Labs  Lab 05/16/22 0045 05/17/22 0139 05/18/22 0739 05/19/22 0731 05/20/22 0317  NA 137 137 139 138 136  K 4.7 4.5 4.6 4.5 4.2  CL 97* 94* 96* 93* 98  CO2 33* 34* 33* 31 32  GLUCOSE 145* 230* 150* 200* 126*  BUN 47* 41* 37* 37* 37*  CREATININE 1.68* 1.63* 1.55* 1.54* 1.43*  CALCIUM 8.6* 8.4* 8.7* 9.0 8.6*  MG 2.4 2.5* 2.5* 2.2  --   PHOS 3.1 3.4 3.4 3.8  --    Liver Function Tests: Recent Labs  Lab 05/16/22 0045 05/17/22 0139 05/18/22 0739 05/19/22 0731  ALBUMIN 2.9* 2.7* 2.8* 2.6*   No results for input(s): "LIPASE", "AMYLASE" in the last 168 hours. No results for input(s): "AMMONIA" in the last 168 hours. CBC: Recent Labs  Lab 05/15/22 0240 05/17/22 0139 05/19/22 0731 05/20/22 0317  WBC  9.6 11.6* 11.5* 11.0*  HGB 9.3* 9.4* 9.8* 9.8*  HCT 31.3* 30.6* 31.0* 32.0*  MCV 91.3 90.0 87.6 90.1  PLT 201 213 235 241   Cardiac Enzymes: Recent Labs  Lab 05/16/22 0045  CKTOTAL 57   BNP: Invalid input(s): "POCBNP" CBG: Recent Labs  Lab 05/20/22 1626 05/20/22 1801 05/20/22 2231 05/21/22 0747 05/21/22 1146  GLUCAP 139* 94 151* 72 170*   D-Dimer No results for input(s): "DDIMER" in the last 72 hours. Hgb A1c No results for input(s): "HGBA1C" in the last 72 hours. Lipid Profile No results for input(s): "CHOL", "HDL", "LDLCALC", "TRIG", "CHOLHDL", "LDLDIRECT" in  the last 72 hours. Thyroid function studies No results for input(s): "TSH", "T4TOTAL", "T3FREE", "THYROIDAB" in the last 72 hours.  Invalid input(s): "FREET3" Anemia work up No results for input(s): "VITAMINB12", "FOLATE", "FERRITIN", "TIBC", "IRON", "RETICCTPCT" in the last 72 hours. Urinalysis    Component Value Date/Time   COLORURINE YELLOW 05/17/2022 1849   APPEARANCEUR CLOUDY (A) 05/17/2022 1849   APPEARANCEUR Clear 05/23/2020 1627   LABSPEC 1.006 05/17/2022 1849   PHURINE 8.0 05/17/2022 1849   GLUCOSEU NEGATIVE 05/17/2022 1849   GLUCOSEU NEGATIVE 01/22/2022 1549   HGBUR SMALL (A) 05/17/2022 1849   BILIRUBINUR NEGATIVE 05/17/2022 1849   BILIRUBINUR Negative 05/23/2020 1627   KETONESUR NEGATIVE 05/17/2022 1849   PROTEINUR NEGATIVE 05/17/2022 1849   UROBILINOGEN 0.2 01/22/2022 1549   NITRITE POSITIVE (A) 05/17/2022 1849   LEUKOCYTESUR LARGE (A) 05/17/2022 1849   Sepsis Labs Recent Labs  Lab 05/15/22 0240 05/17/22 0139 05/19/22 0731 05/20/22 0317  WBC 9.6 11.6* 11.5* 11.0*   Microbiology Recent Results (from the past 240 hour(s))  Resp Panel by RT-PCR (Flu A&B, Covid) Anterior Nasal Swab     Status: None   Collection Time: 05/14/22  6:23 AM   Specimen: Anterior Nasal Swab  Result Value Ref Range Status   SARS Coronavirus 2 by RT PCR NEGATIVE NEGATIVE Final    Comment: (NOTE) SARS-CoV-2  target nucleic acids are NOT DETECTED.  The SARS-CoV-2 RNA is generally detectable in upper respiratory specimens during the acute phase of infection. The lowest concentration of SARS-CoV-2 viral copies this assay can detect is 138 copies/mL. A negative result does not preclude SARS-Cov-2 infection and should not be used as the sole basis for treatment or other patient management decisions. A negative result may occur with  improper specimen collection/handling, submission of specimen other than nasopharyngeal swab, presence of viral mutation(s) within the areas targeted by this assay, and inadequate number of viral copies(<138 copies/mL). A negative result must be combined with clinical observations, patient history, and epidemiological information. The expected result is Negative.  Fact Sheet for Patients:  EntrepreneurPulse.com.au  Fact Sheet for Healthcare Providers:  IncredibleEmployment.be  This test is no t yet approved or cleared by the [place] FDA and  has been authorized for detection and/or diagnosis of SARS-CoV-2 by FDA under an Emergency Use Authorization (EUA). This EUA will remain  in effect (meaning this test can be used) for the duration of the COVID-19 declaration under Section 564(b)(1) of the Act, 21 U.S.C.section 360bbb-3(b)(1), unless the authorization is terminated  or revoked sooner.       Influenza A by PCR NEGATIVE NEGATIVE Final   Influenza B by PCR NEGATIVE NEGATIVE Final    Comment: (NOTE) The Xpert Xpress SARS-CoV-2/FLU/RSV plus assay is intended as an aid in the diagnosis of influenza from Nasopharyngeal swab specimens and should not be used as a sole basis for treatment. Nasal washings and aspirates are unacceptable for Xpert Xpress SARS-CoV-2/FLU/RSV testing.  Fact Sheet for Patients: EntrepreneurPulse.com.au  Fact Sheet for Healthcare  Providers: IncredibleEmployment.be  This test is not yet approved or cleared by the [place] FDA and has been authorized for detection and/or diagnosis of SARS-CoV-2 by FDA under an Emergency Use Authorization (EUA). This EUA will remain in effect (meaning this test can be used) for the duration of the COVID-19 declaration under Section 564(b)(1) of the Act, 21 U.S.C. section 360bbb-3(b)(1), unless the authorization is terminated or revoked.  Performed at [facility], [place] [address] [place], [place] [address]   Culture,  blood (routine x 2)     Status: None   Collection Time: 05/14/22  6:23 AM   Specimen: BLOOD  Result Value Ref Range Status   Specimen Description BLOOD RIGHT ANTECUBITAL  Final   Special Requests   Final    BOTTLES DRAWN AEROBIC AND ANAEROBIC Blood Culture results may not be optimal due to an excessive volume of blood received in culture bottles   Culture   Final    NO GROWTH 5 DAYS Performed at [facility], [place] [address] [place], [place] [address]    Report Status 05/19/2022 FINAL  Final  Culture, blood (routine x 2)     Status: None   Collection Time: 05/14/22  6:25 AM   Specimen: BLOOD  Result Value Ref Range Status   Specimen Description BLOOD LEFT ANTECUBITAL  Final   Special Requests   Final    BOTTLES DRAWN AEROBIC AND ANAEROBIC Blood Culture results may not be optimal due to an excessive volume of blood received in culture bottles   Culture   Final    NO GROWTH 5 DAYS Performed at [facility], [place] [address], [place], [place] [address]    Report Status 05/19/2022 FINAL  Final  Urine Culture     Status: None   Collection Time: 05/14/22  7:57 AM   Specimen: Urine, Clean Catch  Result Value Ref Range Status   Specimen Description URINE, CLEAN CATCH  Final   Special Requests NONE  Final   Culture   Final    NO GROWTH Performed at [facility], [place] [address], [place],  [address]    Report  Status 05/15/2022 FINAL  Final  Urine Culture     Status: Abnormal   Collection Time: 05/17/22  6:17 PM   Specimen: In/Out Cath Urine  Result Value Ref Range Status   Specimen Description IN/OUT CATH URINE  Final   Special Requests   Final    NONE Performed at [facility], [place] [address] [place], [place] [address]    Culture >=100,000 COLONIES/mL PSEUDOMONAS AERUGINOSA (A)  Final   Report Status 05/19/2022 FINAL  Final   Organism ID, Bacteria PSEUDOMONAS AERUGINOSA (A)  Final      Susceptibility   Pseudomonas aeruginosa - MIC*    CEFTAZIDIME 2 SENSITIVE Sensitive     CIPROFLOXACIN <=0.25 SENSITIVE Sensitive     GENTAMICIN 2 SENSITIVE Sensitive     IMIPENEM 2 SENSITIVE Sensitive     PIP/TAZO <=4 SENSITIVE Sensitive     CEFEPIME 2 SENSITIVE Sensitive     * >=100,000 COLONIES/mL PSEUDOMONAS AERUGINOSA     Time coordinating discharge: Over 30 minutes  SIGNED:   Phillips Climes, MD  [facility] 05/21/2022, 12:24 PM Pager   If 7PM-7AM, please contact night-coverage [email or web address]

## 2022-05-23 ENCOUNTER — Telehealth: Payer: Self-pay

## 2022-05-23 NOTE — Telephone Encounter (Addendum)
Transition Care Management Follow-up Telephone Call Date of discharge and from where: [facility] on [facility]22 Dx: J96.22 Acute on chronic respiratory failure with hypoxia How have you been since you were released from the hospital? Per son: "doing better; getting use to the nebulizer and watching her O2 levels" Any questions or concerns? Yes  Items Reviewed: Did the pt receive and understand the discharge instructions provided? Yes ; son did Medications obtained and verified? Yes  Other? No  Any new allergies since your discharge? No  Dietary orders reviewed? Yes; heart healthy diet Do you have support at home? Yes , son  [place] and Equipment/Supplies: Were home health services ordered? yes If so, what is the name of the agency? [facility] ([place], [place], [place])  Has the agency set up a time to come to the patient's home? no Were any new equipment or medical supplies ordered?  Yes: nebulizer, oxygen, ventilator What is the name of the medical supply agency? [employer] Were you able to get the supplies/equipment? yes Do you have any questions related to the use of the equipment or supplies? No  Functional Questionnaire: (I = Independent and D = Dependent) ADLs: I; with assistance from son  Bathing/Dressing- I  Meal Prep- I  Eating- I  Maintaining continence- I  Transferring/Ambulation- I, bedside commode, walker, home is handicap accessible  Managing Meds- I  Follow up appointments reviewed:  PCP Hospital f/u appt confirmed? Yes  Scheduled to see Pricilla Holm, MD on 05/28/2022 @ 4:00 pm. [facility] f/u appt confirmed? No   Are transportation arrangements needed? No  If their condition worsens, is the pt aware to call PCP or go to the Emergency Dept.? Yes Was the patient provided with contact information for the PCP's office or ED? Yes Was to pt encouraged to call back with questions or concerns? Yes

## 2022-05-24 ENCOUNTER — Ambulatory Visit: Payer: Medicare Other | Admitting: Internal Medicine

## 2022-05-28 ENCOUNTER — Ambulatory Visit: Payer: Medicare Other | Admitting: Internal Medicine

## 2022-06-04 ENCOUNTER — Encounter: Payer: Self-pay | Admitting: Internal Medicine

## 2022-06-04 ENCOUNTER — Ambulatory Visit (INDEPENDENT_AMBULATORY_CARE_PROVIDER_SITE_OTHER): Payer: Medicare Other | Admitting: Internal Medicine

## 2022-06-04 VITALS — BP 122/64 | HR 77 | Resp 18 | Ht 64.0 in | Wt 206.8 lb

## 2022-06-04 DIAGNOSIS — N179 Acute kidney failure, unspecified: Secondary | ICD-10-CM

## 2022-06-04 DIAGNOSIS — J410 Simple chronic bronchitis: Secondary | ICD-10-CM

## 2022-06-04 DIAGNOSIS — N189 Chronic kidney disease, unspecified: Secondary | ICD-10-CM | POA: Diagnosis not present

## 2022-06-04 DIAGNOSIS — J9622 Acute and chronic respiratory failure with hypercapnia: Secondary | ICD-10-CM

## 2022-06-04 DIAGNOSIS — J9621 Acute and chronic respiratory failure with hypoxia: Secondary | ICD-10-CM | POA: Diagnosis not present

## 2022-06-04 DIAGNOSIS — I5032 Chronic diastolic (congestive) heart failure: Secondary | ICD-10-CM

## 2022-06-04 DIAGNOSIS — R350 Frequency of micturition: Secondary | ICD-10-CM

## 2022-06-04 LAB — COMPREHENSIVE METABOLIC PANEL
ALT: 16 U/L (ref 0–35)
AST: 15 U/L (ref 0–37)
Albumin: 3.6 g/dL (ref 3.5–5.2)
Alkaline Phosphatase: 75 U/L (ref 39–117)
BUN: 37 mg/dL — ABNORMAL HIGH (ref 6–23)
CO2: 33 mEq/L — ABNORMAL HIGH (ref 19–32)
Calcium: 9.2 mg/dL (ref 8.4–10.5)
Chloride: 98 mEq/L (ref 96–112)
Creatinine, Ser: 1.48 mg/dL — ABNORMAL HIGH (ref 0.40–1.20)
GFR: 31.34 mL/min — ABNORMAL LOW (ref >60.00)
Glucose, Bld: 155 mg/dL — ABNORMAL HIGH (ref 70–99)
Potassium: 4.7 mEq/L (ref 3.5–5.1)
Sodium: 138 mEq/L (ref 135–145)
Total Bilirubin: 0.3 mg/dL (ref 0.2–1.2)
Total Protein: 6.3 g/dL (ref 6.0–8.3)

## 2022-06-04 LAB — CBC
HCT: 33.4 % — ABNORMAL LOW (ref 36.0–46.0)
Hemoglobin: 10.6 g/dL — ABNORMAL LOW (ref 12.0–15.0)
MCHC: 31.8 g/dL (ref 30.0–36.0)
MCV: 87 fl (ref 78.0–100.0)
Platelets: 276 10*3/uL (ref 150.0–400.0)
RBC: 3.84 Mil/uL — ABNORMAL LOW (ref 3.87–5.11)
RDW: 17.2 % — ABNORMAL HIGH (ref 11.5–15.5)
WBC: 9.1 10*3/uL (ref 4.0–10.5)

## 2022-06-04 NOTE — Progress Notes (Unsigned)
   Subjective:   Patient ID: [name], female    DOB: 11-09-1930, 88 y.o.   MRN: [id number]  HPI The patient is an 88 YO female coming in for hospital follow up. Admitted with respiratory problems and discharged with nebulizer.   PMH, [facility], social history reviewed and updated  Review of Systems  Objective:  Physical Exam  Vitals:   06/04/22 1547  BP: 122/64  Pulse: 77  Resp: 18  SpO2: 99%  Weight: 206 lb 12.8 oz (93.8 kg)  Height: '5\' 4"'$  (1.626 m)    Assessment & Plan:

## 2022-06-04 NOTE — Patient Instructions (Signed)
We will check the labs and have given you the wheelchair prescription.

## 2022-06-05 DIAGNOSIS — R350 Frequency of micturition: Secondary | ICD-10-CM | POA: Insufficient documentation

## 2022-06-05 LAB — URINALYSIS, ROUTINE W REFLEX MICROSCOPIC
Bilirubin Urine: NEGATIVE
Hgb urine dipstick: NEGATIVE
Ketones, ur: NEGATIVE
Nitrite: NEGATIVE
RBC / HPF: NONE SEEN (ref 0–?)
Specific Gravity, Urine: <=1.005 — AB (ref 1.000–1.030)
Total Protein, Urine: NEGATIVE
Urine Glucose: NEGATIVE
Urobilinogen, UA: 0.2 (ref 0.0–1.0)
pH: 7 (ref 5.0–8.0)

## 2022-06-05 NOTE — Assessment & Plan Note (Signed)
No flare today. Continue lasix 100 mg daily. Checking CMP for stable renal function as meds were adjusted in the hospital.

## 2022-06-05 NOTE — Assessment & Plan Note (Signed)
New oxygen devices at home are helping, she is doing well at home. Has done her own PT/Ot at home as she did not receive home health services after discharge.

## 2022-06-05 NOTE — Assessment & Plan Note (Signed)
Frequent UTI and checking U/A and culture today.

## 2022-06-05 NOTE — Assessment & Plan Note (Signed)
Checking CBC and CMP to verify stable from discharge.

## 2022-06-05 NOTE — Assessment & Plan Note (Signed)
Needs new manual wheelchair as hers is wearing down. She is using oxygen all the time and she is unable to fulfill her ADLs without this. Rx done today.

## 2022-06-06 LAB — URINE CULTURE

## 2022-06-13 ENCOUNTER — Inpatient Hospital Stay (HOSPITAL_COMMUNITY)
Admission: EM | Admit: 2022-06-13 | Discharge: 2022-06-18 | DRG: 291 | Disposition: A | Discharge status: Home Health | Payer: Medicare Other | Attending: Internal Medicine | Admitting: Internal Medicine

## 2022-06-13 ENCOUNTER — Emergency Department (HOSPITAL_COMMUNITY): Payer: Medicare Other

## 2022-06-13 ENCOUNTER — Other Ambulatory Visit: Payer: Self-pay

## 2022-06-13 DIAGNOSIS — J410 Simple chronic bronchitis: Secondary | ICD-10-CM | POA: Diagnosis not present

## 2022-06-13 DIAGNOSIS — E1142 Type 2 diabetes mellitus with diabetic polyneuropathy: Secondary | ICD-10-CM

## 2022-06-13 DIAGNOSIS — J449 Chronic obstructive pulmonary disease, unspecified: Secondary | ICD-10-CM | POA: Diagnosis present

## 2022-06-13 DIAGNOSIS — E7800 Pure hypercholesterolemia, unspecified: Secondary | ICD-10-CM | POA: Diagnosis present

## 2022-06-13 DIAGNOSIS — Y95 Nosocomial condition: Secondary | ICD-10-CM | POA: Diagnosis present

## 2022-06-13 DIAGNOSIS — Z882 Allergy status to sulfonamides status: Secondary | ICD-10-CM

## 2022-06-13 DIAGNOSIS — N1832 Chronic kidney disease, stage 3b: Secondary | ICD-10-CM | POA: Diagnosis not present

## 2022-06-13 DIAGNOSIS — E875 Hyperkalemia: Secondary | ICD-10-CM | POA: Diagnosis present

## 2022-06-13 DIAGNOSIS — D649 Anemia, unspecified: Secondary | ICD-10-CM | POA: Diagnosis present

## 2022-06-13 DIAGNOSIS — R0602 Shortness of breath: Secondary | ICD-10-CM | POA: Diagnosis not present

## 2022-06-13 DIAGNOSIS — I5033 Acute on chronic diastolic (congestive) heart failure: Secondary | ICD-10-CM | POA: Diagnosis present

## 2022-06-13 DIAGNOSIS — N189 Chronic kidney disease, unspecified: Secondary | ICD-10-CM | POA: Diagnosis not present

## 2022-06-13 DIAGNOSIS — Z8249 Family history of ischemic heart disease and other diseases of the circulatory system: Secondary | ICD-10-CM

## 2022-06-13 DIAGNOSIS — Z7951 Long term (current) use of inhaled steroids: Secondary | ICD-10-CM

## 2022-06-13 DIAGNOSIS — Z66 Do not resuscitate: Secondary | ICD-10-CM | POA: Diagnosis present

## 2022-06-13 DIAGNOSIS — D72829 Elevated white blood cell count, unspecified: Secondary | ICD-10-CM | POA: Diagnosis present

## 2022-06-13 DIAGNOSIS — Z9981 Dependence on supplemental oxygen: Secondary | ICD-10-CM

## 2022-06-13 DIAGNOSIS — I440 Atrioventricular block, first degree: Secondary | ICD-10-CM | POA: Diagnosis not present

## 2022-06-13 DIAGNOSIS — J9622 Acute and chronic respiratory failure with hypercapnia: Secondary | ICD-10-CM | POA: Diagnosis present

## 2022-06-13 DIAGNOSIS — I509 Heart failure, unspecified: Principal | ICD-10-CM

## 2022-06-13 DIAGNOSIS — G9341 Metabolic encephalopathy: Secondary | ICD-10-CM | POA: Diagnosis present

## 2022-06-13 DIAGNOSIS — N179 Acute kidney failure, unspecified: Secondary | ICD-10-CM | POA: Diagnosis present

## 2022-06-13 DIAGNOSIS — Z823 Family history of stroke: Secondary | ICD-10-CM | POA: Diagnosis not present

## 2022-06-13 DIAGNOSIS — N289 Disorder of kidney and ureter, unspecified: Secondary | ICD-10-CM | POA: Diagnosis not present

## 2022-06-13 DIAGNOSIS — I10 Essential (primary) hypertension: Secondary | ICD-10-CM | POA: Diagnosis not present

## 2022-06-13 DIAGNOSIS — Z79899 Other long term (current) drug therapy: Secondary | ICD-10-CM | POA: Diagnosis not present

## 2022-06-13 DIAGNOSIS — Z96642 Presence of left artificial hip joint: Secondary | ICD-10-CM | POA: Diagnosis present

## 2022-06-13 DIAGNOSIS — I130 Hypertensive heart and chronic kidney disease with heart failure and stage 1 through stage 4 chronic kidney disease, or unspecified chronic kidney disease: Secondary | ICD-10-CM | POA: Diagnosis not present

## 2022-06-13 DIAGNOSIS — J9621 Acute and chronic respiratory failure with hypoxia: Secondary | ICD-10-CM | POA: Diagnosis present

## 2022-06-13 DIAGNOSIS — I110 Hypertensive heart disease with heart failure: Secondary | ICD-10-CM | POA: Diagnosis not present

## 2022-06-13 DIAGNOSIS — Z885 Allergy status to narcotic agent status: Secondary | ICD-10-CM

## 2022-06-13 DIAGNOSIS — G4733 Obstructive sleep apnea (adult) (pediatric): Secondary | ICD-10-CM | POA: Diagnosis present

## 2022-06-13 DIAGNOSIS — Z7982 Long term (current) use of aspirin: Secondary | ICD-10-CM

## 2022-06-13 DIAGNOSIS — G4700 Insomnia, unspecified: Secondary | ICD-10-CM | POA: Diagnosis present

## 2022-06-13 DIAGNOSIS — I2510 Atherosclerotic heart disease of native coronary artery without angina pectoris: Secondary | ICD-10-CM | POA: Diagnosis not present

## 2022-06-13 DIAGNOSIS — E871 Hypo-osmolality and hyponatremia: Secondary | ICD-10-CM | POA: Diagnosis not present

## 2022-06-13 DIAGNOSIS — W06XXXA Fall from bed, initial encounter: Secondary | ICD-10-CM | POA: Diagnosis present

## 2022-06-13 DIAGNOSIS — Z90710 Acquired absence of both cervix and uterus: Secondary | ICD-10-CM

## 2022-06-13 DIAGNOSIS — Z7189 Other specified counseling: Secondary | ICD-10-CM

## 2022-06-13 DIAGNOSIS — Z515 Encounter for palliative care: Secondary | ICD-10-CM | POA: Diagnosis not present

## 2022-06-13 DIAGNOSIS — E1165 Type 2 diabetes mellitus with hyperglycemia: Secondary | ICD-10-CM | POA: Diagnosis present

## 2022-06-13 DIAGNOSIS — Z794 Long term (current) use of insulin: Secondary | ICD-10-CM

## 2022-06-13 DIAGNOSIS — Z888 Allergy status to other drugs, medicaments and biological substances status: Secondary | ICD-10-CM

## 2022-06-13 DIAGNOSIS — N183 Chronic kidney disease, stage 3 unspecified: Secondary | ICD-10-CM | POA: Diagnosis present

## 2022-06-13 DIAGNOSIS — E739 Lactose intolerance, unspecified: Secondary | ICD-10-CM | POA: Diagnosis present

## 2022-06-13 DIAGNOSIS — E1149 Type 2 diabetes mellitus with other diabetic neurological complication: Secondary | ICD-10-CM | POA: Diagnosis present

## 2022-06-13 DIAGNOSIS — R0902 Hypoxemia: Secondary | ICD-10-CM | POA: Diagnosis not present

## 2022-06-13 DIAGNOSIS — Z87440 Personal history of urinary (tract) infections: Secondary | ICD-10-CM

## 2022-06-13 DIAGNOSIS — E1122 Type 2 diabetes mellitus with diabetic chronic kidney disease: Secondary | ICD-10-CM | POA: Diagnosis present

## 2022-06-13 LAB — BRAIN NATRIURETIC PEPTIDE: B Natriuretic Peptide: 732.5 pg/mL — ABNORMAL HIGH (ref 0.0–100.0)

## 2022-06-13 LAB — MAGNESIUM
Magnesium: 2.2 mg/dL (ref 1.7–2.4)
Magnesium: 2.4 mg/dL (ref 1.7–2.4)

## 2022-06-13 LAB — I-STAT VENOUS BLOOD GAS, ED
Acid-Base Excess: 3 mmol/L — ABNORMAL HIGH (ref 0.0–2.0)
Bicarbonate: 31.2 mmol/L — ABNORMAL HIGH (ref 20.0–28.0)
Calcium, Ion: 1.2 mmol/L (ref 1.15–1.40)
HCT: 33 % — ABNORMAL LOW (ref 36.0–46.0)
Hemoglobin: 11.2 g/dL — ABNORMAL LOW (ref 12.0–15.0)
O2 Saturation: 92 %
Potassium: 5.2 mmol/L — ABNORMAL HIGH (ref 3.5–5.1)
Sodium: 133 mmol/L — ABNORMAL LOW (ref 135–145)
TCO2: 33 mmol/L — ABNORMAL HIGH (ref 22–32)
pCO2, Ven: 69.7 mmHg — ABNORMAL HIGH (ref 44–60)
pH, Ven: 7.259 (ref 7.25–7.43)
pO2, Ven: 77 mmHg — ABNORMAL HIGH (ref 32–45)

## 2022-06-13 LAB — CBC WITH DIFFERENTIAL/PLATELET
Abs Immature Granulocytes: 0.17 10*3/uL — ABNORMAL HIGH (ref 0.00–0.07)
Basophils Absolute: 0 10*3/uL (ref 0.0–0.1)
Basophils Relative: 0 %
Eosinophils Absolute: 0.2 10*3/uL (ref 0.0–0.5)
Eosinophils Relative: 2 %
HCT: 32.4 % — ABNORMAL LOW (ref 36.0–46.0)
Hemoglobin: 9.8 g/dL — ABNORMAL LOW (ref 12.0–15.0)
Immature Granulocytes: 1 %
Lymphocytes Relative: 8 %
Lymphs Abs: 0.9 10*3/uL (ref 0.7–4.0)
MCH: 27.7 pg (ref 26.0–34.0)
MCHC: 30.2 g/dL (ref 30.0–36.0)
MCV: 91.5 fL (ref 80.0–100.0)
Monocytes Absolute: 0.6 10*3/uL (ref 0.1–1.0)
Monocytes Relative: 5 %
Neutro Abs: 9.9 10*3/uL — ABNORMAL HIGH (ref 1.7–7.7)
Neutrophils Relative %: 84 %
Platelets: 233 10*3/uL (ref 150–400)
RBC: 3.54 MIL/uL — ABNORMAL LOW (ref 3.87–5.11)
RDW: 15.9 % — ABNORMAL HIGH (ref 11.5–15.5)
WBC: 11.8 10*3/uL — ABNORMAL HIGH (ref 4.0–10.5)
nRBC: 0.2 % (ref 0.0–0.2)

## 2022-06-13 LAB — COMPREHENSIVE METABOLIC PANEL
ALT: 18 U/L (ref 0–44)
AST: 17 U/L (ref 15–41)
Albumin: 2.9 g/dL — ABNORMAL LOW (ref 3.5–5.0)
Alkaline Phosphatase: 69 U/L (ref 38–126)
Anion gap: 9 (ref 5–15)
BUN: 42 mg/dL — ABNORMAL HIGH (ref 8–23)
CO2: 29 mmol/L (ref 22–32)
Calcium: 8.7 mg/dL — ABNORMAL LOW (ref 8.9–10.3)
Chloride: 96 mmol/L — ABNORMAL LOW (ref 98–111)
Creatinine, Ser: 1.75 mg/dL — ABNORMAL HIGH (ref 0.44–1.00)
GFR, Estimated: 28 mL/min — ABNORMAL LOW (ref >60)
Glucose, Bld: 189 mg/dL — ABNORMAL HIGH (ref 70–99)
Potassium: 5.2 mmol/L — ABNORMAL HIGH (ref 3.5–5.1)
Sodium: 134 mmol/L — ABNORMAL LOW (ref 135–145)
Total Bilirubin: 0.7 mg/dL (ref 0.3–1.2)
Total Protein: 5.6 g/dL — ABNORMAL LOW (ref 6.5–8.1)

## 2022-06-13 LAB — I-STAT ARTERIAL BLOOD GAS, ED
Acid-Base Excess: 3 mmol/L — ABNORMAL HIGH (ref 0.0–2.0)
Bicarbonate: 32.6 mmol/L — ABNORMAL HIGH (ref 20.0–28.0)
Calcium, Ion: 1.22 mmol/L (ref 1.15–1.40)
HCT: 31 % — ABNORMAL LOW (ref 36.0–46.0)
Hemoglobin: 10.5 g/dL — ABNORMAL LOW (ref 12.0–15.0)
O2 Saturation: 85 %
Patient temperature: 98.2
Potassium: 5.3 mmol/L — ABNORMAL HIGH (ref 3.5–5.1)
Sodium: 132 mmol/L — ABNORMAL LOW (ref 135–145)
TCO2: 35 mmol/L — ABNORMAL HIGH (ref 22–32)
pCO2 arterial: 83.4 mmHg (ref 32–48)
pH, Arterial: 7.199 — CL (ref 7.35–7.45)
pO2, Arterial: 64 mmHg — ABNORMAL LOW (ref 83–108)

## 2022-06-13 LAB — CBG MONITORING, ED
Glucose-Capillary: 180 mg/dL — ABNORMAL HIGH (ref 70–99)
Glucose-Capillary: 188 mg/dL — ABNORMAL HIGH (ref 70–99)
Glucose-Capillary: 152 mg/dL — ABNORMAL HIGH (ref 70–99)

## 2022-06-13 LAB — TROPONIN I (HIGH SENSITIVITY)
Troponin I (High Sensitivity): 13 ng/L (ref <18)
Troponin I (High Sensitivity): 14 ng/L (ref <18)

## 2022-06-13 LAB — GLUCOSE, CAPILLARY: Glucose-Capillary: 98 mg/dL (ref 70–99)

## 2022-06-13 MED ORDER — FUROSEMIDE 10 MG/ML IJ SOLN
60.0000 mg | Freq: Once | INTRAMUSCULAR | Status: AC
  Administered 2022-06-13: 60 mg via INTRAVENOUS
  Filled 2022-06-13: qty 6

## 2022-06-13 MED ORDER — FUROSEMIDE 10 MG/ML IJ SOLN
60.0000 mg | Freq: Two times a day (BID) | INTRAMUSCULAR | Status: DC
  Administered 2022-06-13 – 2022-06-16 (×6): 60 mg via INTRAVENOUS
  Filled 2022-06-13 (×6): qty 6

## 2022-06-13 MED ORDER — HYDRALAZINE HCL 25 MG PO TABS
25.0000 mg | ORAL_TABLET | Freq: Two times a day (BID) | ORAL | Status: DC
  Administered 2022-06-14 – 2022-06-18 (×8): 25 mg via ORAL
  Filled 2022-06-13 (×9): qty 1

## 2022-06-13 MED ORDER — INSULIN GLARGINE-YFGN 100 UNIT/ML ~~LOC~~ SOLN
10.0000 [IU] | Freq: Every day | SUBCUTANEOUS | Status: DC
  Administered 2022-06-14 – 2022-06-17 (×3): 10 [IU] via SUBCUTANEOUS
  Filled 2022-06-13 (×6): qty 0.1

## 2022-06-13 MED ORDER — ASPIRIN 81 MG PO TBEC
81.0000 mg | DELAYED_RELEASE_TABLET | Freq: Every morning | ORAL | Status: DC
  Administered 2022-06-14 – 2022-06-18 (×5): 81 mg via ORAL
  Filled 2022-06-13 (×5): qty 1

## 2022-06-13 MED ORDER — GABAPENTIN 100 MG PO CAPS
100.0000 mg | ORAL_CAPSULE | Freq: Two times a day (BID) | ORAL | Status: DC
  Administered 2022-06-14 – 2022-06-18 (×9): 100 mg via ORAL
  Filled 2022-06-13 (×9): qty 1

## 2022-06-13 MED ORDER — ACETAMINOPHEN 650 MG RE SUPP: 650.0000 mg | Freq: Four times a day (QID) | RECTAL | Status: DC | PRN

## 2022-06-13 MED ORDER — ENOXAPARIN SODIUM 30 MG/0.3ML IJ SOSY
30.0000 mg | PREFILLED_SYRINGE | INTRAMUSCULAR | Status: DC
  Administered 2022-06-13 – 2022-06-18 (×6): 30 mg via SUBCUTANEOUS
  Filled 2022-06-13 (×6): qty 0.3

## 2022-06-13 MED ORDER — FLUTICASONE FUROATE-VILANTEROL 200-25 MCG/ACT IN AEPB
1.0000 | INHALATION_SPRAY | Freq: Every evening | RESPIRATORY_TRACT | Status: DC
  Administered 2022-06-15 – 2022-06-16 (×2): 1 via RESPIRATORY_TRACT
  Filled 2022-06-13 (×2): qty 28

## 2022-06-13 MED ORDER — SODIUM CHLORIDE 0.9% FLUSH
3.0000 mL | Freq: Two times a day (BID) | INTRAVENOUS | Status: DC
  Administered 2022-06-13 – 2022-06-18 (×11): 3 mL via INTRAVENOUS

## 2022-06-13 MED ORDER — POLYVINYL ALCOHOL 1.4 % OP SOLN
1.0000 [drp] | OPHTHALMIC | Status: DC | PRN
  Filled 2022-06-13: qty 15

## 2022-06-13 MED ORDER — ATORVASTATIN CALCIUM 40 MG PO TABS
40.0000 mg | ORAL_TABLET | Freq: Every day | ORAL | Status: DC
  Administered 2022-06-14 – 2022-06-18 (×5): 40 mg via ORAL
  Filled 2022-06-13 (×5): qty 1

## 2022-06-13 MED ORDER — ALBUTEROL SULFATE (2.5 MG/3ML) 0.083% IN NEBU
2.5000 mg | INHALATION_SOLUTION | RESPIRATORY_TRACT | Status: DC | PRN
Start: 2022-06-13 — End: 2022-06-18

## 2022-06-13 MED ORDER — INSULIN ASPART 100 UNIT/ML IJ SOLN
0.0000 [IU] | Freq: Three times a day (TID) | INTRAMUSCULAR | Status: DC
  Administered 2022-06-13 – 2022-06-14 (×2): 3 [IU] via SUBCUTANEOUS
  Administered 2022-06-14: 2 [IU] via SUBCUTANEOUS
  Administered 2022-06-15 (×2): 3 [IU] via SUBCUTANEOUS
  Administered 2022-06-16: 2 [IU] via SUBCUTANEOUS
  Administered 2022-06-16: 5 [IU] via SUBCUTANEOUS
  Administered 2022-06-16: 3 [IU] via SUBCUTANEOUS
  Administered 2022-06-17: 8 [IU] via SUBCUTANEOUS
  Administered 2022-06-17 (×2): 3 [IU] via SUBCUTANEOUS
  Administered 2022-06-18: 11 [IU] via SUBCUTANEOUS
  Administered 2022-06-18: 3 [IU] via SUBCUTANEOUS

## 2022-06-13 MED ORDER — RISAQUAD PO CAPS
1.0000 | ORAL_CAPSULE | Freq: Every day | ORAL | Status: DC
  Administered 2022-06-14 – 2022-06-18 (×5): 1 via ORAL
  Filled 2022-06-13 (×6): qty 1

## 2022-06-13 MED ORDER — METOPROLOL TARTRATE 25 MG PO TABS
25.0000 mg | ORAL_TABLET | Freq: Two times a day (BID) | ORAL | Status: DC
  Administered 2022-06-14 – 2022-06-18 (×9): 25 mg via ORAL
  Filled 2022-06-13 (×9): qty 1

## 2022-06-13 MED ORDER — PROBIOTIC DAILY PO CAPS: ORAL_CAPSULE | Freq: Every morning | ORAL | Status: DC

## 2022-06-13 MED ORDER — DICLOFENAC SODIUM 1 % EX GEL
1.0000 | Freq: Every day | CUTANEOUS | Status: DC
  Administered 2022-06-13 – 2022-06-16 (×3): 1 via TOPICAL
  Filled 2022-06-13: qty 100

## 2022-06-13 MED ORDER — POLYETHYL GLYCOL-PROPYL GLYCOL 0.4-0.3 % OP SOLN: 2.0000 [drp] | Freq: Every day | OPHTHALMIC | Status: DC | PRN

## 2022-06-13 MED ORDER — LORAZEPAM 2 MG/ML IJ SOLN
0.5000 mg | Freq: Four times a day (QID) | INTRAMUSCULAR | Status: DC | PRN
  Administered 2022-06-14: 0.5 mg via INTRAVENOUS
  Filled 2022-06-13: qty 1

## 2022-06-13 MED ORDER — ACETAMINOPHEN 325 MG PO TABS: 650.0000 mg | ORAL_TABLET | Freq: Four times a day (QID) | ORAL | Status: DC | PRN

## 2022-06-13 NOTE — H&P (Addendum)
History and Physical    Patient: [name] WIO:[id number] DOB: 20-Mar-1931 DOA: 06/13/2022 DOS: the patient was seen and examined on 06/13/2022 PCP: Hoyt Koch, MD  Patient coming from: Home with son via EMS  Chief Complaint:  Chief Complaint  Patient presents with   Shortness of Breath   HPI: [name] is a 88 y.o. female with medical history significant of \ hypertension, hyperlipidemia, CAD, diastolic CHF, COPD on 2-3 L of oxygen, DM type II, and morbid obesity who presented for shortness of breath.  At this time the patient is lethargic and unable to provide any history and therefore obtained initial from review of records.  Her son presented back to the room later and provided more history.  She had reportedly woken up this morning in a panic which is unusual for her with no associated shortness of breath or chest pain at that time.  She had taken a morning breathing treatment.  It was reported that her son was at present her bedside when she slowly slid off the bed onto her bottom.  No reported loss of consciousness or trauma to her head.  Her son was unable to get her back into bed and thus had called EMS.  At some point patient had become disconnected from her home oxygen.  He reports the patient often takes her oxygen off in the middle of the night and he is not able to get her to keep it on.  Patient had just been recently hospitalized from 7/11-7/18 for acute on chronic respiratory failure with hypoxia and hypercapnia requiring placement on BiPAP.  Echo revealed EF to be 60 to 65% with grade 2 diastolic dysfunction.  BNP at that time was elevated at 580.  Her Lasix dose at discharge was changed to 60 mg twice daily from 80 mg daily.   However, her son notes that patient's primary care provider changed her dose to 100 mg daily as her dosage was thought to be too high.  When she was discharged her son did not feel that she was ready.  PT and home health services were reported to  have been ordered, but no one ever called or came to the house.  Patient began feeling well approximately 2-3 days ago and her daughter noted that she was intermittently confused but normally is alert and oriented.  She had been noted to have a couple episodes of vomiting which her son wondered if she may have aspirated.  Her son notes that she had been ordered a NIV machine, but it had not been set up yet and they were supposed to come to the house tomorrow.  The Fire department found the patient to be disconnected from her oxygen which was set at 4 L at the time with O2 saturation 52%.  She was placed on 15 L nonrebreather and sats were in the 70s upon EMS arrival.  O2 saturations improved to 99 -100% on 10 L nonrebreather in route to the hospital.  On admission to the emergency department patient was seen to be afebrile with mild tachypnea and O2 saturations currently maintained on 6 L of nasal cannula oxygen.  Labs significant for WBC 11.8, hemoglobin 9.8, sodium 134, potassium 5.2, BUN 42, creatinine 1.75, glucose 189, and BNP 732.5.  Chest x-ray concerning for mild congestive heart failure.  Patient has been given Lasix 60 mg IV.   Review of Systems: unable to review all systems due to the inability of the patient to answer  questions. Past Medical History:  Diagnosis Date   Anasarca 04/2009   found to be secondary to pericardial effusion with tamponade/pericardial window done   Chronic diastolic CHF (congestive heart failure) (HCC)    CKD (chronic kidney disease), stage III (HCC)    COPD (chronic obstructive pulmonary disease) ([place])    Coronary artery disease    a. mild-mod CAD 2004.   Diabetes mellitus    insulin dependent   Herpes labialis    Healing herpes labialis   Hypercholesterolemia    Hypertension    Lumbar radiculopathy    Lumbar scoliosis    Morbid obesity ([place])    Pericardial effusion 04/2009   a. 2010 with tamponade s/p window.   PONV (postoperative nausea and vomiting)     has not had any problems since 2001   Spondylolisthesis    Spondylosis    Past Surgical History:  Procedure Laterality Date   BACK SURGERY     microdiskectomy L3-4 on L/partial facetectomy 3-4 on L/removal of synovial cyst on left L3-4   BREAST BIOPSY     left   CARDIAC CATHETERIZATION  04/25/2003   L heart cath w/coronary angiography/R femoral artery/ EF 65%/no mitral regurgitation/ angiography L main coronary artery smooth & normal   EYE SURGERY     ophthalmoscopy/peritomy adjacent to the limbus from the 8 to 2:30 position superiorly   lumbar laminectomy     SUBXYPHOID PERICARDIAL WINDOW  04/2009   for pericardial effusion w/pericardial tamponade/sac drained w/20-French Baard drain/transesophageal echocardiogram confimed complete evacution of pericardial fluid   TOTAL HIP ARTHROPLASTY  03/06/2012   Procedure: TOTAL HIP ARTHROPLASTY;  Surgeon: Kerin Salen, MD;  Location: [facility];  Service: Orthopedics;  Laterality: Left;  DEPUY/PINNACLE, SUMMIT STEM, CUP POLY & CERAMIC   VAGINAL HYSTERECTOMY     Social History:  reports that she has never smoked. She has never used smokeless tobacco. She reports that she does not drink alcohol and does not use drugs.  Allergies  Allergen Reactions   Atenolol Other (See Comments)    weakness   Chlorhexidine Other (See Comments)    Unknown reaction   Codeine Nausea And Vomiting    Hydrocodone is ok   Lactose Intolerance (Gi) Diarrhea   Lodine [Etodolac] Other (See Comments)    dizziness   Sulfa Drugs Cross Reactors Nausea Only   Sulfamethoxazole Nausea And Vomiting   Benzonatate Nausea And Vomiting   Escitalopram Oxalate Other (See Comments)    mild hallucinations   Metformin Nausea And Vomiting    Reaction to IR and XL   Oxycodone-Acetaminophen Other (See Comments)    Percocet does not relieve pts' pain   Pregabalin Other (See Comments)    Unknown reaction    Family History  Problem Relation Age of Onset   Heart attack Father     Hypertension Father    Stroke Mother    Angina Mother    Coronary artery disease Other     Prior to Admission medications   Medication Sig Start Date End Date Taking? Authorizing Provider  acetaminophen (TYLENOL) 500 MG tablet Take 500 mg by mouth 2 (two) times daily as needed for mild pain or headache.    [provider]  albuterol (VENTOLIN HFA) 108 (90 Base) MCG/ACT inhaler Inhale 2 puffs into the lungs every 6 (six) hours as needed for wheezing or shortness of breath. 11/01/21   Biagio Borg, MD  allopurinol (ZYLOPRIM) 100 MG tablet TAKE 1 TABLET BY MOUTH EVERY DAY 05/21/22  Hoyt Koch, MD  ALPRAZolam Duanne Moron) 0.25 MG tablet TAKE 1 TABLET BY MOUTH AT BEDTIME AS NEEDED FOR ANXIETY Patient taking differently: Take 0.25 mg by mouth at bedtime. 04/12/22   Hoyt Koch, MD  amLODipine (NORVASC) 5 MG tablet Take 5 mg by mouth daily. 05/24/22   [provider]  aspirin EC 81 MG tablet Take 81 mg by mouth every morning.     [provider]  atorvastatin (LIPITOR) 40 MG tablet TAKE 1 TABLET ONCE A DAY FOR CHOLESTEROL 05/21/22   Hoyt Koch, MD  diclofenac Sodium (VOLTAREN) 1 % GEL Apply 4 g topically 4 (four) times daily. To affected joint. Patient taking differently: Apply 1 Application topically at bedtime. 06/27/20   Gregor Hams, MD  ergocalciferol (VITAMIN D2) 1.25 MG (50000 UT) capsule Take 1 capsule (50,000 Units total) by mouth every Friday. 01/25/22   Hoyt Koch, MD  fluticasone Asencion Islam) 50 MCG/ACT nasal spray Place 2 sprays into both nostrils daily. Patient taking differently: Place 1 spray into both nostrils at bedtime. 10/15/16   Orson Eva, MD  fluticasone furoate-vilanterol (BREO ELLIPTA) 200-25 MCG/ACT AEPB Inhale 1 puff into the lungs daily. Patient taking differently: Inhale 1 puff into the lungs every evening. 01/22/22   Hoyt Koch, MD  furosemide (LASIX) 40 MG tablet Take 1.5 tablets (60 mg total) by mouth  2 (two) times daily. Patient taking differently: Take 100 mg by mouth daily. 05/21/22 06/20/22  Elgergawy, Silver Huguenin, MD  gabapentin (NEURONTIN) 100 MG capsule TAKE 1 CAPSULE BY MOUTH TWICE A DAY 05/21/22   Hoyt Koch, MD  hydrALAZINE (APRESOLINE) 25 MG tablet Take 1 tablet (25 mg total) by mouth 2 (two) times daily. 01/23/22   Nahser, Wonda Cheng, MD  insulin aspart (NOVOLOG) 100 UNIT/ML injection Inject 12 Units into the skin 3 (three) times daily with meals.    [provider]  insulin glargine (LANTUS) 100 UNIT/ML injection Inject 0.25 mLs (25 Units total) into the skin at bedtime. Patient taking differently: Inject 30 Units into the skin at bedtime. 12/24/19   Harold Hedge, MD  ipratropium-albuterol (DUONEB) 0.5-2.5 (3) MG/3ML SOLN Take 3 mLs by nebulization every 6 (six) hours as needed. Patient not taking: Reported on 06/04/2022 05/21/22   Elgergawy, Silver Huguenin, MD  LACTASE PO Take 1 tablet by mouth every morning.    [provider]  metoprolol tartrate (LOPRESSOR) 25 MG tablet TAKE 1 TABLET BY MOUTH TWICE A DAY 05/21/22   Hoyt Koch, MD  Multiple Vitamin (MULTIVITAMIN WITH MINERALS) TABS tablet Take 1 tablet by mouth every morning.    [provider]  ONE TOUCH ULTRA TEST test strip 1 each by Other route as directed. 01/13/19   [provider]  OXYGEN Inhale 3 L into the lungs continuous.    [provider]  Polyethyl Glycol-Propyl Glycol 0.4-0.3 % SOLN Place 2 drops into both eyes daily as needed (dry eyes). Systane    [provider]  Probiotic Product (PROBIOTIC DAILY PO) Take 1 tablet by mouth every morning.    [provider]    Physical Exam: Vitals:   06/13/22 0711  BP: 129/60  Pulse: 85  Resp: (!) 22  Temp: 98.2 F (36.8 C)  SpO2: 99%    Constitutional: Elderly female who is lethargic and not easily arousable Eyes: PERRL, lids and conjunctivae normal ENMT: Mucous membranes are moist.  .  Neck:  normal, supple  Respiratory: Normal respiratory effort currently on 6  L of nasal cannula oxygen without significant wheezes appreciated. Cardiovascular: Regular rate and rhythm, no murmurs / rubs / gallops.  At least 2+ pitting bilateral lower extremity edema. Abdomen: Protuberant abdomen with no tenderness to palpation.  Bowel sounds positive.  Musculoskeletal: no clubbing / cyanosis. No joint deformity upper and lower extremities.   Skin: no rashes, lesions, ulcers. No induration Psychiatric: Lethargic and unable to assess at this time. Data Reviewed:  EKG reveals sinus rhythm 86 bpm with first-degree heart block and multiple PVCs  Assessment and Plan: Acute on chronic respiratory failure with hypoxia and hypercapnia secondary to acute on chronic diastolic congestive heart failure Patient presented with complaints of worsening shortness of breath.  Initially found to have O2 sats in the 50-70s requiring nonrebreather at 10 L to improve O2 saturations greater than 92%.  On physical exam noted to have at least 2+ pitting bilateral lower extremity edema with O2 saturations maintained on 6 L.Marland Kitchen  BNP elevated at 732.5 which is higher than prior admission.  Chest x-ray showing mild pulmonary edema.  Venous blood gas significant for pCO2 of 69.7.  Patient had just recently had echocardiogram on 7/11 which showed EF 60-65% with grade 2 diastolic dysfunction.  Patient had been given Lasix 60 mg IV.  Patient is on notes that she has a NIV mask at home that has not been set up yet. -Admit to a progressive bed -CHF order set utilized -Continuous pulse oximetry with nasal cannula oxygen to maintain O2 saturation -Strict I&O's and daily weight -Check ABG stat (which showed pH 7.19, pCO2 83.4, pO2 64) -BiPAP -Lasix 60 mg IV twice daily -Resume beta-blocker when able -No ACE/ARB due to renal insufficiency -Reassess diuresis and adjust diuretics as needed -Foley care consulted -Cardiology consulted,  will  follow-up for any further recommendations  Acute metabolic encephalopathy Patient noted to be significantly lethargic on physical exam.  Son noted that over the last couple days patient has been intermittently confused and is normally alert and oriented.  Initial venous blood gas noted pH of 69.7.  Suspect hypercapnia as the cause of her increased lethargy. -Neurochecks  Leukocytosis Acute.  WBC elevated at 11.8.  Patient had vomited for which some was concern for aspiration.  Chest x-ray showed concern for edema, but no clear focal consolidation.  Patient previously had a UTI during last hospitalization in July, but had negative UA on 8/1. No clear signs of infection appreciated at this time could be related to the possibility of aspiration. -Check urinalysis -Recheck CBC tomorrow in a.m -Monitor off of antibiotics at this time  Hyperkalemia Acute.  Potassium 5.2 on admission.  Patient has been given Lasix IV.   -Continue to monitor potassium level  COPD, without exacerbation Patient without significant signs of wheezing on physical exam.  Do not think patient appears to be in acute exacerbation of COPD. -Breathing treatments as needed -Continue Breo once able  Renal insufficiency superimposed chronic kidney disease stage IIIb Patient presents with creatinine elevated up to 1.75 with BUN 42.  Baseline creatinine previously noted to be 1.4-1.5.  Is not greater than 0.3 increased from baseline to suggest acute kidney injury. -Monitor kidney function with diuresis  Controlled diabetes mellitus type 2, with hyperglycemia Patient presents with glucose of 189.  Last hemoglobin A1c noted to be 6.9 on 05/15/2022. -Hypoglycemic protocol -Reduce Semglee to 10 units nightly -CBGs before every meal with moderate SSI -Adjust regimen as needed  Essential hypertension Blood pressures 129/60. -Resume home blood pressure regimen once  able  Hyponatremia Acute.  Sodium 133.  Suspect hypervolemic  hyponatremia given patient's fluid overloaded status. -Continue to monitor sodium levels  Normocytic anemia Hemoglobin 9.8 g/dL which appears near patient's previous baseline.  Suspect likely related to patient's chronic kidney disease. -Continue to monitor  Insomnia -Hold Xanax  Hyperlipidemia -Resume atorvastatin once able  Morbid obesity -Check height and weight  Suspected sleep apnea  -Patient likely needs to be set up with outpatient sleep study  DVT prophylaxis: Lovenox Advance Care Planning:   Code Status: DNR    Consults: Cardiology  Family Communication: Son updated at bedside  Severity of Illness: The appropriate patient status for this patient is INPATIENT. Inpatient status is judged to be reasonable and necessary in order to provide the required intensity of service to ensure the patient's safety. The patient's presenting symptoms, physical exam findings, and initial radiographic and laboratory data in the context of their chronic comorbidities is felt to place them at high risk for further clinical deterioration. Furthermore, it is not anticipated that the patient will be medically stable for discharge from the hospital within 2 midnights of admission.   * I certify that at the point of admission it is my clinical judgment that the patient will require inpatient hospital care spanning beyond 2 midnights from the point of admission due to high intensity of service, high risk for further deterioration and high frequency of surveillance required.*  Author: Norval Morton, MD 06/13/2022 8:52 AM  For on call review [email or web address].

## 2022-06-13 NOTE — Progress Notes (Signed)
Critical ABG values give to Dr. Fuller Plan.

## 2022-06-13 NOTE — ED Notes (Signed)
Pt is more lethargic than arrival, Sats range around 85-92% on 4L, O2 changed to 6L and PA notified.

## 2022-06-13 NOTE — ED Provider Notes (Signed)
[facility] EMERGENCY DEPARTMENT Provider Note   CSN: [id number] Arrival date & time: 06/13/22  6270     History  No chief complaint on file.   [name] is a 88 y.o. female is a 88 year old female with past medical history chronic diastolic CHF, COPD on 4 L baseline home oxygen, recurrent UTI, CKD 3, coronary artery disease, type 2 diabetes, hypertension and hyperlipidemia presents to the emergency department after she was disconnected from her home oxygen that was found to be hypoxic to the 50s-70s.  EMS was summoned to the patient's residence after she had inadvertently disconnected herself from her home oxygen.  Patient states that she had woken up this morning "in a panic."  She states that this happens to her often.  She had no associated chest pain or shortness of breath upon awakening.  She had proceeded to start her day by taking her normal scheduled morning doses of COPD inhaler regimen.  Her son was assisting her at bedside when she had "slowly slid off the bed."  She had struck her buttocks softly on the floor and had no associated head trauma.  She denies taking any blood thinning medication.  She states that while her son was with her he was unable to help her back into bed and he had thus called for EMS since she had become disconnected from her oxygen and was unable to get back into bed on her own.  Upon EMSs arrival the patient was found to be hypoxic to the 50s to 70s.  She was placed on nonrebreather 15 L/min and sent to the emergency department for further evaluation.  Upon arrival the patient states she has no associated chest pain and feels that she is breathing at her normal baseline respiratory effort.  She states that she has been able to get back into bed she would not have presented to the emergency department today.  Despite her recent admission for UTI she states that she has no associated dysuria at this time.  No recent fevers or chills.  HPI        Home Medications Prior to Admission medications   Medication Sig Start Date End Date Taking? Authorizing Provider  acetaminophen (TYLENOL) 500 MG tablet Take 500 mg by mouth 2 (two) times daily as needed for mild pain or headache.    [provider]  albuterol (VENTOLIN HFA) 108 (90 Base) MCG/ACT inhaler Inhale 2 puffs into the lungs every 6 (six) hours as needed for wheezing or shortness of breath. 11/01/21   Biagio Borg, MD  allopurinol (ZYLOPRIM) 100 MG tablet TAKE 1 TABLET BY MOUTH EVERY DAY 05/21/22   Hoyt Koch, MD  ALPRAZolam Duanne Moron) 0.25 MG tablet TAKE 1 TABLET BY MOUTH AT BEDTIME AS NEEDED FOR ANXIETY Patient taking differently: Take 0.25 mg by mouth at bedtime. 04/12/22   Hoyt Koch, MD  amLODipine (NORVASC) 5 MG tablet Take 5 mg by mouth daily. 05/24/22   [provider]  aspirin EC 81 MG tablet Take 81 mg by mouth every morning.     [provider]  atorvastatin (LIPITOR) 40 MG tablet TAKE 1 TABLET ONCE A DAY FOR CHOLESTEROL 05/21/22   Hoyt Koch, MD  diclofenac Sodium (VOLTAREN) 1 % GEL Apply 4 g topically 4 (four) times daily. To affected joint. Patient taking differently: Apply 1 Application topically at bedtime. 06/27/20   Gregor Hams, MD  ergocalciferol (VITAMIN D2) 1.25 MG (50000 UT) capsule Take 1  capsule (50,000 Units total) by mouth every Friday. 01/25/22   Hoyt Koch, MD  fluticasone Asencion Islam) 50 MCG/ACT nasal spray Place 2 sprays into both nostrils daily. Patient taking differently: Place 1 spray into both nostrils at bedtime. 10/15/16   Orson Eva, MD  fluticasone furoate-vilanterol (BREO ELLIPTA) 200-25 MCG/ACT AEPB Inhale 1 puff into the lungs daily. Patient taking differently: Inhale 1 puff into the lungs every evening. 01/22/22   Hoyt Koch, MD  furosemide (LASIX) 40 MG tablet Take 1.5 tablets (60 mg total) by mouth 2 (two) times daily. Patient taking differently: Take 100 mg by mouth  daily. 05/21/22 06/20/22  Elgergawy, Silver Huguenin, MD  gabapentin (NEURONTIN) 100 MG capsule TAKE 1 CAPSULE BY MOUTH TWICE A DAY 05/21/22   Hoyt Koch, MD  hydrALAZINE (APRESOLINE) 25 MG tablet Take 1 tablet (25 mg total) by mouth 2 (two) times daily. 01/23/22   Nahser, Wonda Cheng, MD  insulin aspart (NOVOLOG) 100 UNIT/ML injection Inject 12 Units into the skin 3 (three) times daily with meals.    [provider]  insulin glargine (LANTUS) 100 UNIT/ML injection Inject 0.25 mLs (25 Units total) into the skin at bedtime. Patient taking differently: Inject 30 Units into the skin at bedtime. 12/24/19   Harold Hedge, MD  ipratropium-albuterol (DUONEB) 0.5-2.5 (3) MG/3ML SOLN Take 3 mLs by nebulization every 6 (six) hours as needed. Patient not taking: Reported on 06/04/2022 05/21/22   Elgergawy, Silver Huguenin, MD  LACTASE PO Take 1 tablet by mouth every morning.    [provider]  metoprolol tartrate (LOPRESSOR) 25 MG tablet TAKE 1 TABLET BY MOUTH TWICE A DAY 05/21/22   Hoyt Koch, MD  Multiple Vitamin (MULTIVITAMIN WITH MINERALS) TABS tablet Take 1 tablet by mouth every morning.    [provider]  ONE TOUCH ULTRA TEST test strip 1 each by Other route as directed. 01/13/19   [provider]  OXYGEN Inhale 3 L into the lungs continuous.    [provider]  Polyethyl Glycol-Propyl Glycol 0.4-0.3 % SOLN Place 2 drops into both eyes daily as needed (dry eyes). Systane    [provider]  Probiotic Product (PROBIOTIC DAILY PO) Take 1 tablet by mouth every morning.    [provider]      Allergies    Atenolol, Chlorhexidine, Codeine, Lactose intolerance (gi), Lodine [etodolac], Sulfa drugs cross reactors, Sulfamethoxazole, Benzonatate, Escitalopram oxalate, Metformin, Oxycodone-acetaminophen, and Pregabalin    Review of Systems   Review of Systems  Constitutional:  Negative for chills and fever.  HENT:  Negative for ear pain and sore  throat.   Eyes:  Negative for pain and visual disturbance.  Respiratory:  Negative for cough and shortness of breath.   Cardiovascular:  Negative for chest pain and palpitations.  Gastrointestinal:  Negative for abdominal pain and vomiting.  Genitourinary:  Negative for dysuria and hematuria.  Musculoskeletal:  Negative for arthralgias and back pain.  Skin:  Negative for color change and rash.  Neurological:  Negative for seizures and syncope.  All other systems reviewed and are negative.   Physical Exam Updated Vital Signs There were no vitals taken for this visit. Physical Exam Vitals and nursing note reviewed.  Constitutional:      General: She is not in acute distress.    Appearance: She is well-developed.     Comments: Upon entering exam the patient is sitting upright awake and alert.  She appears frail and is in no acute distress.  HENT:     Head: Normocephalic and atraumatic.  Eyes:     Conjunctiva/sclera: Conjunctivae normal.  Cardiovascular:     Rate and Rhythm: Normal rate and regular rhythm.     Heart sounds: No murmur heard. Pulmonary:     Effort: Pulmonary effort is normal. No respiratory distress.     Breath sounds: Normal breath sounds.     Comments: Rales to the mid lungs bilaterally.  She is somewhat tachypneic in the high teens to low 20s.  She is currently saturating in the low 90s on her home 4 L nasal cannula.  She is speaking in full sentences without difficulty. Abdominal:     Palpations: Abdomen is soft.     Tenderness: There is no abdominal tenderness.     Comments: Soft nontender nontender  Musculoskeletal:        General: No swelling.     Cervical back: Neck supple.     Comments: 2+ symmetric pitting edema of the bilateral lower extremities.  Skin:    General: Skin is warm and dry.     Capillary Refill: Capillary refill takes less than 2 seconds.  Neurological:     Mental Status: She is alert and oriented to person, place, and time.      Comments: Alert and oriented moving all extremities spontaneously grossly neurologically intact  Psychiatric:        Mood and Affect: Mood normal.     ED Results / Procedures / Treatments   Labs (all labs ordered are listed, but only abnormal results are displayed) Labs Reviewed - No data to display  EKG None  Radiology No results found.  Procedures Procedures    Medications Ordered in ED Medications - No data to display  ED Course/ Medical Decision Making/ A&P                           Medical Decision Making Amount and/or Complexity of Data Reviewed Labs: ordered. Radiology: ordered.  Risk Prescription drug management.   Patient presents the emergency department hemodynamically stable, slightly tachypneic, saturating at 100% on 15 L nonrebreather after being disconnected from her home oxygen with exam remarkable for rales to the mid lungs bilaterally without associated significant respiratory distress.  Differential diagnosis includes heart failure exacerbation versus COPD exacerbation versus mixed COPD CHF exacerbation versus less likely PE or ACS in the absence of chest pain and new EKG changes.   I have personally reviewed and interpreted the patient's EKG which was notable for sinus rhythm with LVH pattern, no ST segment elevation consistent with ischemia.  Intermittent PVCs.  Grossly similar to prior on 7-11.  I have personally reviewed and interpreted the patient's chest x-ray which was remarkable for cardiomegaly and mild to moderate pulmonary edema.  Will provide 60 mg of intravenous Lasix given exam consistent with volume overloaded state.  Will obtain delta troponins, BNP, basic metabolic panel and admit patient for likely heart failure exacerbation in the setting of her paroxysmal nocturnal dyspnea and hypoxia as above.  I have personally reviewed and interpreted the patient's laboratory workup which was remarkable for BNP elevation to 700 which is higher  than prior admission for CHF exacerbation.  Patient also has bump in creatinine from approximate 1.4-1.75 with mild hyperkalemia to 5.2.  Feel that the patient has some degree of cardiorenal syndrome.  Will provide 60 mg of intravenous Lasix and admit.  Serial reassessment reveals that the patient is somewhat hypoxic while  asleep to the 80s.  Will increase nasal cannula for 4 to 6 L.  She generally appears comfortable and is not in respiratory distress.  Do not feel that BiPAP is appropriate at this time given her degree of comfort and lack of distress.  I have personally reviewed and interpreted the patient's laboratory gas which was remarkable for very mild respiratory acidosis likely consistent with patient's baseline.  Overall impression is that the patient is experiencing CHF exacerbation as opposed to COPD.  Patient was admitted to hospitalist medicine in stable condition.            Final Clinical Impression(s) / ED Diagnoses Final diagnoses:  Acute on chronic congestive heart failure, unspecified heart failure type [facility])    Rx / DC Orders ED Discharge Orders     None         Levie Heritage, MD 06/13/22 [id number]    Gareth Morgan, MD 06/13/22 2138

## 2022-06-13 NOTE — ED Notes (Signed)
RN and this tech applied a clean chux to her bedding.

## 2022-06-13 NOTE — Consult Note (Addendum)
Cardiology Consultation:   Patient ID: OANH DEVIVO MRN: [id number]; DOB: 09-13-1931  Admit date: 06/13/2022 Date of Consult: 06/13/2022  PCP:  Hoyt Koch, MD   [facility] Providers Cardiologist:  Mertie Moores, MD     Patient Profile:   [name] is a 88 y.o. female with a hx of diastolic CHF, CAD, COPD on 2-3 L oxygen at baseline, HTN, HLD, type 2 DM, morbid obesity who is being seen 06/13/2022 for the evaluation of CHF at the request of Dr. Tamala Julian.  History of Present Illness:   [name] is an 88 year old female with above medical history who is followed by Dr. Acie Fredrickson.  Per chart review, a heart catheterization in May 2000 that showed moderate coronary artery disease.  A second cardiac catheterization in 2004 showed only mild-moderate CAD.  Later in 2010, patient developed anasarca and was found to have a pericardial effusion with tamponade. She underwent a pericardial window in June 2010.  In 10/2016, patient was admitted to the hospital for evaluation of shortness of breath.  Underwent echocardiogram on 10/08/2016 that showed EF 55-60%, no regional wall motion abnormalities, grade 2 diastolic dysfunction.  She was made on Lasix, however was also diagnosed with COPD at that time.  Patient was again admitted to the hospital in 10/2020 for evaluation of acute on chronic respiratory failure.  Echocardiogram from 10/24/2020 showed EF 60-65%, no regional wall motion abnormalities, grade 1 diastolic dysfunction, normal RV systolic function, moderately dilated left atrium, mildly dilated right atrium.  Patient was diagnosed with COPD exacerbation and treated with antibiotics, steroids.  Patient was last seen by cardiology on 11/13/2021.  At that time, patient had recently been treated for upper respiratory infection, wheezing and was tapered off steroids.  Her volume status was stable on Lasix 80 mg daily.  Patient was most recently admitted to the hospital in 09/2022 for  evaluation of acute on chronic respiratory failure, acute on chronic diastolic CHF.  Echocardiogram on 05/14/2022 showed EF 60-65%, no regional wall motion abnormalities, grade 2 diastolic dysfunction, normal RV systolic function, moderately dilated left atrium.  Patient was diuresed and discharged on Lasix 60 mg twice daily. Her PCP later transitioned her to lasix 100 mg daily.   Patient presented to the ED on 8/10. Patient was lethargic, son reported that patient had woken up in a panic without associated sob or chest pain. Patient slowly slid off the bed onto her bottom. Her home oxygen was disconnected, patient was found to by hypoxic with sats int he 90s. She was started on a rebreather, O2 saturations improved to 99-100%. Upon arrival to the ED, patient was afebrile. O2 saturations normal on 6 L via nasal cannula.   Labs in the ED showed Na 134, K 5.2, creatinine 1.75, albumin 2.9, mag 2.2, WBC 11.8, hemoglobin 9.8, platelets 233. hsTn 13>>14. BNP elevated to 732.5 (up from 90 on 05/19/22).  CXR showed cephalization of the pulmonary vasculature, mild pulmonary edema. Overall suggestive of mild congestive heart failure.  EKG showed sinus rhythm, PVCs, prolonged PR.   At the time of my interview, patient was asleep and wearing BiPAP.  Discussed with son at bedside who is her primary caretaker.  Son reports that the patient wears 3-4 L of oxygen at home, but her oxygen often falls off while she is sleeping.  Her oxygen saturation drops quickly when she is not wearing her nasal cannula.  Last night, her nasal cannula had fallen off sometime during the night.  Her  son heard her yelling in her room saying she was "falling".  She entered the room and saw the patient slide out of bed slowly and landed on her bottom.  He did not see her hit her head or lose consciousness.  He was unable to get her back into bed so he called EMS.  When EMS arrived, her oxygen saturations were in the 70s.  Prior to this event,  patient had not been coughing.  She was at her baseline oxygen requirements. Son explains that she was admitted to the hospital about 2 weeks ago with similar complaints.  She ended up being in the hospital for about 8 days and was discharged on Lasix 60 mg twice a day.  Her son was concerned that that was too high and was causing her to get out of bed too often at night to urinate.  He discussed with her primary care provider, reduce the dose to 100 mg daily.  Son believes that the patient has good urine output, however she has developed ankle edema, abdominal distention.  Past Medical History:  Diagnosis Date   Anasarca 04/2009   found to be secondary to pericardial effusion with tamponade/pericardial window done   Chronic diastolic CHF (congestive heart failure) (HCC)    CKD (chronic kidney disease), stage III (HCC)    COPD (chronic obstructive pulmonary disease) ([place])    Coronary artery disease    a. mild-mod CAD 2004.   Diabetes mellitus    insulin dependent   Herpes labialis    Healing herpes labialis   Hypercholesterolemia    Hypertension    Lumbar radiculopathy    Lumbar scoliosis    Morbid obesity ([place])    Pericardial effusion 04/2009   a. 2010 with tamponade s/p window.   PONV (postoperative nausea and vomiting)    has not had any problems since 2001   Spondylolisthesis    Spondylosis     Past Surgical History:  Procedure Laterality Date   BACK SURGERY     microdiskectomy L3-4 on L/partial facetectomy 3-4 on L/removal of synovial cyst on left L3-4   BREAST BIOPSY     left   CARDIAC CATHETERIZATION  04/25/2003   L heart cath w/coronary angiography/R femoral artery/ EF 65%/no mitral regurgitation/ angiography L main coronary artery smooth & normal   EYE SURGERY     ophthalmoscopy/peritomy adjacent to the limbus from the 8 to 2:30 position superiorly   lumbar laminectomy     SUBXYPHOID PERICARDIAL WINDOW  04/2009   for pericardial effusion w/pericardial tamponade/sac  drained w/20-French Baard drain/transesophageal echocardiogram confimed complete evacution of pericardial fluid   TOTAL HIP ARTHROPLASTY  03/06/2012   Procedure: TOTAL HIP ARTHROPLASTY;  Surgeon: Kerin Salen, MD;  Location: [facility];  Service: Orthopedics;  Laterality: Left;  DEPUY/PINNACLE, SUMMIT STEM, CUP POLY & CERAMIC   VAGINAL HYSTERECTOMY       Home Medications:  Prior to Admission medications   Medication Sig Start Date End Date Taking? Authorizing Provider  acetaminophen (TYLENOL) 500 MG tablet Take 500 mg by mouth 2 (two) times daily as needed for mild pain or headache.    [provider]  albuterol (VENTOLIN HFA) 108 (90 Base) MCG/ACT inhaler Inhale 2 puffs into the lungs every 6 (six) hours as needed for wheezing or shortness of breath. 11/01/21   Biagio Borg, MD  allopurinol (ZYLOPRIM) 100 MG tablet TAKE 1 TABLET BY MOUTH EVERY DAY 05/21/22   Hoyt Koch, MD  ALPRAZolam Duanne Moron) 0.25 MG  tablet TAKE 1 TABLET BY MOUTH AT BEDTIME AS NEEDED FOR ANXIETY Patient taking differently: Take 0.25 mg by mouth at bedtime. 04/12/22   Hoyt Koch, MD  amLODipine (NORVASC) 5 MG tablet Take 5 mg by mouth daily. 05/24/22   [provider]  aspirin EC 81 MG tablet Take 81 mg by mouth every morning.     [provider]  atorvastatin (LIPITOR) 40 MG tablet TAKE 1 TABLET ONCE A DAY FOR CHOLESTEROL 05/21/22   Hoyt Koch, MD  diclofenac Sodium (VOLTAREN) 1 % GEL Apply 4 g topically 4 (four) times daily. To affected joint. Patient taking differently: Apply 1 Application topically at bedtime. 06/27/20   Gregor Hams, MD  ergocalciferol (VITAMIN D2) 1.25 MG (50000 UT) capsule Take 1 capsule (50,000 Units total) by mouth every Friday. 01/25/22   Hoyt Koch, MD  fluticasone Asencion Islam) 50 MCG/ACT nasal spray Place 2 sprays into both nostrils daily. Patient taking differently: Place 1 spray into both nostrils at bedtime. 10/15/16   Orson Eva, MD   fluticasone furoate-vilanterol (BREO ELLIPTA) 200-25 MCG/ACT AEPB Inhale 1 puff into the lungs daily. Patient taking differently: Inhale 1 puff into the lungs every evening. 01/22/22   Hoyt Koch, MD  furosemide (LASIX) 40 MG tablet Take 1.5 tablets (60 mg total) by mouth 2 (two) times daily. Patient taking differently: Take 100 mg by mouth daily. 05/21/22 06/20/22  Elgergawy, Silver Huguenin, MD  gabapentin (NEURONTIN) 100 MG capsule TAKE 1 CAPSULE BY MOUTH TWICE A DAY 05/21/22   Hoyt Koch, MD  hydrALAZINE (APRESOLINE) 25 MG tablet Take 1 tablet (25 mg total) by mouth 2 (two) times daily. 01/23/22   Nahser, Wonda Cheng, MD  insulin aspart (NOVOLOG) 100 UNIT/ML injection Inject 12 Units into the skin 3 (three) times daily with meals.    [provider]  insulin glargine (LANTUS) 100 UNIT/ML injection Inject 0.25 mLs (25 Units total) into the skin at bedtime. Patient taking differently: Inject 30 Units into the skin at bedtime. 12/24/19   Harold Hedge, MD  ipratropium-albuterol (DUONEB) 0.5-2.5 (3) MG/3ML SOLN Take 3 mLs by nebulization every 6 (six) hours as needed. Patient not taking: Reported on 06/04/2022 05/21/22   Elgergawy, Silver Huguenin, MD  LACTASE PO Take 1 tablet by mouth every morning.    [provider]  metoprolol tartrate (LOPRESSOR) 25 MG tablet TAKE 1 TABLET BY MOUTH TWICE A DAY 05/21/22   Hoyt Koch, MD  Multiple Vitamin (MULTIVITAMIN WITH MINERALS) TABS tablet Take 1 tablet by mouth every morning.    [provider]  ONE TOUCH ULTRA TEST test strip 1 each by Other route as directed. 01/13/19   [provider]  OXYGEN Inhale 3 L into the lungs continuous.    [provider]  Polyethyl Glycol-Propyl Glycol 0.4-0.3 % SOLN Place 2 drops into both eyes daily as needed (dry eyes). Systane    [provider]  Probiotic Product (PROBIOTIC DAILY PO) Take 1 tablet by mouth every morning.    [provider]     Inpatient Medications: Scheduled Meds:  enoxaparin (LOVENOX) injection  30 mg Subcutaneous Q24H   furosemide  60 mg Intravenous BID   insulin aspart  0-15 Units Subcutaneous TID WC   insulin glargine-yfgn  10 Units Subcutaneous QHS   sodium chloride flush  3 mL Intravenous Q12H   Continuous Infusions:  PRN Meds: acetaminophen **OR** acetaminophen, albuterol  Allergies:    Allergies  Allergen Reactions   Atenolol  Other (See Comments)    weakness   Chlorhexidine Other (See Comments)    Unknown reaction   Codeine Nausea And Vomiting    Hydrocodone is ok   Lactose Intolerance (Gi) Diarrhea   Lodine [Etodolac] Other (See Comments)    dizziness   Sulfa Drugs Cross Reactors Nausea Only   Sulfamethoxazole Nausea And Vomiting   Benzonatate Nausea And Vomiting   Escitalopram Oxalate Other (See Comments)    mild hallucinations   Metformin Nausea And Vomiting    Reaction to IR and XL   Oxycodone-Acetaminophen Other (See Comments)    Percocet does not relieve pts' pain   Pregabalin Other (See Comments)    Unknown reaction    Social History:   Social History   Socioeconomic History   Marital status: Widowed    Spouse name: Not on file   Number of children: Not on file   Years of education: Not on file   Highest education level: Not on file  Occupational History   Not on file  Tobacco Use   Smoking status: Never   Smokeless tobacco: Never  Substance and Sexual Activity   Alcohol use: No   Drug use: No   Sexual activity: Not Currently  Other Topics Concern   Not on file  Social History Narrative   Not on file   Social Determinants of Health   Financial Resource Strain: Low Risk  (06/21/2021)   Overall Financial Resource Strain (CARDIA)    Difficulty of Paying Living Expenses: Not hard at all  Food Insecurity: No Food Insecurity (06/21/2021)   Hunger Vital Sign    Worried About Running Out of Food in the Last Year: Never true    [place] in the Last  Year: Never true  Transportation Needs: No Transportation Needs (06/21/2021)   PRAPARE - Hydrologist (Medical): No    Lack of Transportation (Non-Medical): No  Physical Activity: Insufficiently Active (06/21/2021)   Exercise Vital Sign    Days of Exercise per Week: 5 days    Minutes of Exercise per Session: 10 min  Stress: No Stress Concern Present (06/21/2021)   [place]    Feeling of Stress : Not at all  Social Connections: Socially Isolated (06/21/2021)   Social Connection and Isolation Panel [NHANES]    Frequency of Communication with Friends and Family: More than three times a week    Frequency of Social Gatherings with Friends and Family: More than three times a week    Attends Religious Services: Never    Marine scientist or Organizations: No    Attends Archivist Meetings: Never    Marital Status: Never married  Intimate Partner Violence: Not At Risk (06/21/2021)   Humiliation, Afraid, Rape, and Kick questionnaire    Fear of Current or Ex-Partner: No    Emotionally Abused: No    Physically Abused: No    Sexually Abused: No    Family History:    Family History  Problem Relation Age of Onset   Heart attack Father    Hypertension Father    Stroke Mother    Angina Mother    Coronary artery disease Other      ROS:  Please see the history of present illness.   All other ROS reviewed and negative.     Physical Exam/Data:   Vitals:   06/13/22 1115 06/13/22 1130 06/13/22 1145 06/13/22 1200  BP: Marland Kitchen)  113/51 (!) 125/51 (!) 111/52 (!) 105/49  Pulse: 79 75 77 65  Resp: '18 18 20 19  '$ Temp:      SpO2: 94% 97% 93% 92%   No intake or output data in the 24 hours ending 06/13/22 1256    06/04/2022    3:47 PM 05/20/2022    5:00 AM 05/19/2022    5:00 AM  Last 3 Weights  Weight (lbs) 206 lb 12.8 oz 209 lb 14.1 oz 211 lb 3.2 oz  Weight (kg) 93.804 kg 95.2 kg 95.8 kg      There is no height or weight on file to calculate BMI.  General: Elderly female, sleeping. Weraing bipap  HEENT: normal Neck: no JVD Vascular: Radial pulses 2+ bilaterally Cardiac:  normal S1, S2; RRR; no murmur  Lungs:  Crackles throughout, occasional expiratory wheezes   Abd: distended, nontender  Ext: 2+ pitting edema in BLE  Musculoskeletal:  No deformities, BUE and BLE strength normal and equal Skin: warm and dry  Neuro:  CNs 2-12 intact, no focal abnormalities noted Psych:  Normal affect   EKG:  The EKG was personally reviewed and demonstrates:  sinus rhythm, PVCs, prolonged PR.  Telemetry:  Telemetry was personally reviewed and demonstrates:  Sinus rhythm with PVCs   Relevant CV Studies:  Echocardiogram 05/14/22 1. Left ventricular ejection fraction, by estimation, is 60 to 65%. The  left ventricle has normal function. The left ventricle has no regional  wall motion abnormalities. There is mild concentric left ventricular  hypertrophy. Left ventricular diastolic  parameters are consistent with Grade II diastolic dysfunction  (pseudonormalization). Elevated left atrial pressure.   2. Right ventricular systolic function is normal. The right ventricular  size is normal. Tricuspid regurgitation signal is inadequate for assessing  PA pressure.   3. Left atrial size was moderately dilated.   4. The mitral valve is degenerative. Trivial mitral valve regurgitation.  No evidence of mitral stenosis. Moderate mitral annular calcification.   5. The aortic valve is tricuspid. There is mild calcification of the  aortic valve. There is mild thickening of the aortic valve. Aortic valve  regurgitation is not visualized. Aortic valve sclerosis/calcification is  present, without any evidence of  aortic stenosis.   6. The inferior vena cava is normal in size with greater than 50%  respiratory variability, suggesting right atrial pressure of 3 mmHg.   Comparison(s): Prior images reviewed  side by side. There is evidence of  high mean left atrial pressure, based on E/e' ratio.   Laboratory Data:  High Sensitivity Troponin:   Recent Labs  Lab 06/13/22 0727 06/13/22 0930  TROPONINIHS 13 14     Chemistry Recent Labs  Lab 06/13/22 0727 06/13/22 0738 06/13/22 0930 06/13/22 1103  NA 134* 133*  --  132*  K 5.2* 5.2*  --  5.3*  CL 96*  --   --   --   CO2 29  --   --   --   GLUCOSE 189*  --   --   --   BUN 42*  --   --   --   CREATININE 1.75*  --   --   --   CALCIUM 8.7*  --   --   --   MG 2.2  --  2.4  --   GFRNONAA 28*  --   --   --   ANIONGAP 9  --   --   --     Recent Labs  Lab 06/13/22 0727  PROT 5.6*  ALBUMIN 2.9*  AST 17  ALT 18  ALKPHOS 69  BILITOT 0.7   Lipids No results for input(s): "CHOL", "TRIG", "HDL", "LABVLDL", "LDLCALC", "CHOLHDL" in the last 168 hours.  Hematology Recent Labs  Lab 06/13/22 0727 06/13/22 0738 06/13/22 1103  WBC 11.8*  --   --   RBC 3.54*  --   --   HGB 9.8* 11.2* 10.5*  HCT 32.4* 33.0* 31.0*  MCV 91.5  --   --   MCH 27.7  --   --   MCHC 30.2  --   --   RDW 15.9*  --   --   PLT 233  --   --    Thyroid No results for input(s): "TSH", "FREET4" in the last 168 hours.  BNP Recent Labs  Lab 06/13/22 0727  BNP 732.5*    DDimer No results for input(s): "DDIMER" in the last 168 hours.   Radiology/Studies:  DG Chest Portable 1 View  Result Date: 06/13/2022 CLINICAL DATA:  88 year old female with history of dyspnea. Shortness of breath. EXAM: PORTABLE CHEST 1 VIEW COMPARISON:  Chest x-ray 05/14/2022. FINDINGS: There is cephalization of the pulmonary vasculature and slight indistinctness of the interstitial markings suggestive of mild pulmonary edema. Mild blunting of the costophrenic sulci suggestive of trace bilateral pleural effusions. No pneumothorax. Heart size is moderately enlarged. Upper mediastinal contours are distorted by patient's rotation to the right. IMPRESSION: 1. The appearance the chest suggests mild  congestive heart failure, as above. Electronically Signed   By: Vinnie Langton M.D.   On: 06/13/2022 07:53     Assessment and Plan:   Acute on Chronic Diastolic Heart Failure Acute on chronic respiratory failure with hypercarbia -Most recent echocardiogram from 05/14/2022 showed EF 60-65%, no regional wall motion abnormalities, grade 2 diastolic dysfunction, normal RV systolic function.  - Patient presented to the ED with altered mental status/lethargy.  Per son, patient woke up this AM with panic, slid off of the bed onto her bottom.  Home oxygen was disconnected, EMS found that patient was hypoxic with oxygen saturations in the 70s.  Started on BiPAP with improvement - BNP elevated to 730 (increased from 90.1 on 05/19/22) - Chest x-rays with findings consistent with mild CHF - ABG showed pH 7.199, pCO2 83.4, pO2 64.  Patient currently on Bi-pap in the ER - So far patient has received 1 dose of IV Lasix 60 mg. Given her renal dysfunction with creatinine of 1.75, I believe this is an appropriate starting dose. Will follow I/Os, renal function and adjust dosage as necessary  - I/O's not recorded. Recommend strict I/Os and daily weights this admission  - Consider repeat limited echo this admission- will discuss with MD if necessary. Patient did have complete echo on 05/14/2022 - Palliative care consulted-- follow their recommendations  AKI on CKD stage IIIb - Baseline creatinine 1.4-1.5 - Creatinine 1.75 on presentation - Avoid nephrotoxic medications and follow renal function with diuresis   Suspected OSA  - Needs outpatient sleep study  - Son willing to try some type of facemask overnight to help patient stay on oxygen   Otherwise per primary  - Acute metabolic encephalopathy/lethargy  - COPD - no acute exacerbation  - Leukocytosis  - Type 2 DM  - Hyponatremia  - Normocytic anemia    Risk Assessment/Risk Scores:      New York Heart Association (NYHA) Functional Class NYHA Class  IV        For questions or updates,  please contact [place] Please consult [email or web address] for contact info under    Signed, Margie Billet, PA-C  06/13/2022 12:56 PM  Patient seen and examined and agree with Vikki Ports, PA-C.  In brief, the patient is a 88 year old female with history of diastolic CHF, CAD, COPD on 2-3 L oxygen at baseline, HTN, HLD, type 2 DM, morbid obesity who presented with hypoxia and hypercarbia consistent with acute on chronic respiratory failure. Cardiology is now consulted for acute on chronic diastolic heart failure.  Patient with known diastolic heart failure. She was recently admitted for acute volume overload in 05/2022. TTE on that admission showed EF 60-65%, no regional wall motion abnormalities, grade 2 diastolic dysfunction, normal RV systolic function, moderately dilated left atrium.  Patient was diuresed and discharged on Lasix 60 mg twice daily. Her PCP later transitioned her to lasix 100 mg daily.   She represents on this admission after she woke up from sleep acutely SOB. Her oxygen that she was supposed to wear at night was disconnected. EMS was called and the patient was noted to be hypoxic to the 50-70s upon their arrival. She was placed on 15L [place] later transitioned to BiPAP in the ER. Labs in the ED notable for trop 13>14. BNP 732 (previously 90 on 05/19/22). CXR with pulmonary edema. ECG with NSR, 1st degree AVB, IVCD, PVCs. She was given lasix for diuresis.   Currently, the patient is somnolent on BiPAP. Only intermittently opening eyes during exam. Appears grossly overloaded. Will continue IV diuresis.   GEN: Somnolent, on BiPAP  Neck: Unable to assess due to body habitus Cardiac: RRR, 1/6 systolic murmur  Respiratory: Coarse, scattered rhonchi GI: Soft, nontender, non-distended  MS: 2+ bilateral pitting edema. Warm Neuro:  Somnolent Psych: Unable to assess   Plan: -Continue lasix '60mg'$  IV BID for diuresis -Continue  BiPAP -Needs to be evaluated for CPAP/BiPAP at home; has been taking off her oxygen at night -Agree with palliative care consultation -Continue ASA '81mg'$  daily, lipitor '40mg'$  daily -Continue metop '25mg'$  BID -Monitor I/Os and daily weights  Gwyndolyn Kaufman, MD

## 2022-06-13 NOTE — ED Triage Notes (Signed)
Pt arriving from home via [facility]. Pt is wheel chair bound and was sitting up out of bed and slid to the floor, her O2 of 4L that she uses at home disconnected.  Psychologist, occupational. Was first at the scene and found pt desatting at 52% . She was then placed on a 15L nonrebreather and sats were in the 70s upon arrival of EMS. In Transport Sats were 99-100 w 10L. Pt had no complaints of pain or SOB. Pt had slight rails on lower side.   Last VS  HR 87  spO2 98% on nonrebreather  132/82  RR 25

## 2022-06-13 NOTE — Consult Note (Signed)
Consultation Note Date: 06/13/2022   Patient Name: [name]  DOB: 1931/05/14  MRN: [id number]  Age / Sex: 88 y.o., female  PCP: Hoyt Koch, MD Referring Physician: Norval Morton, MD  Reason for Consultation: Establishing goals of care  HPI/Patient Profile: 88 y.o. female  with past medical history of hypertension, hyperlipidemia, CAD, diastolic CHF, COPD on 2-3 L of oxygen, DM type II, and morbid obesity admitted on 06/13/2022 with dyspnea.   Patient had just been recently hospitalized from 7/11-7/18 for acute on chronic respiratory failure with hypoxia and hypercapnia requiring placement on BiPAP. Now in the ED for the same. PMT has been consulted to assist with goals of care conversation.  Clinical Assessment and Goals of Care:  I have reviewed medical records including EPIC notes, labs and imaging,, assessed the patient and then met at the bedside with son [name] to discuss diagnosis prognosis, GOC, EOL wishes, disposition and options.  I introduced Palliative Medicine as specialized medical care for people living with serious illness. It focuses on providing relief from the symptoms and stress of a serious illness. The goal is to improve quality of life for both the patient and the family.  We discussed a brief life review of the patient and then focused on their current illness.   I attempted to elicit values and goals of care important to the patient.    Medical History Review and Understanding:  Reviewed patient's several comorbidities including COPD, heart failure, kidney disease and acute respiratory failure in detail.  Patient's son has a good understanding of the severity of her illness.  Social History: Patient lives at home with her son [name] who is her sole caregiver and has been for the past 5 years.  Her husband died around 5 years ago from kidney disease and dementia.  She has  a sister that she is close with and another son was not very involved in her care.  Functional and Nutritional State: At baseline, patient is able to transfer herself from bed to wheelchair and take a few steps from her wheelchair to use the restroom.  [name] encourages her to eat and appetite is adequate normally, although decreased over the past few days.  [name] reports that she has had cognitive decline which he attributes to prolonged episodes of hypoxia.  Palliative Symptoms: Dyspnea, edema, intermittent confusion  Advance Directives: A detailed discussion regarding advanced directives was had.  Patient's son [name] reports he is HCPOA as well as durable POA.   Code Status: Concepts specific to code status, artifical feeding and hydration, and rehospitalization were considered and discussed.  [name] confirms patient wish for DNR.  Discussion: Patient's son [name] has had a good experience with palliative care and hospice in the past, as he had this support while caring for his father before his death in December 29, 2016.  [name] has learned a lot from that experience, noting that his father could have been enrolled in hospice earlier for greater benefit and relief from suffering.  His father was only in hospice for 7 days before his death. [name] wishes he received more open and honest updates about his father actively dying so he could prepare sooner.  It is evident that he has tremendous caregiver burden and guilt/anxiety about making the right choices to honor his mother.  They had a discussion about her wishes and care preferences as recently as last night.  She is never direct with him when discussing death, but she did mention a cousin's death  at age 35 and that she "can understand what she meant" in regards to this cousin stating she could not go on living any further in her condition at her age.  [name] feels that patient's quality of life is poor and understands "mom is 69 and everything  is failing" however many other family members do not have this realization.  We discussed options moving forward, such as palliative versus hospice and home health versus SNF.  He has been very hesitant to consider SNF due to her experience at a rehab facility during [place] (they were told about an outbreak after arrival despite being aware beforehand).  We discussed the risks and benefits including allowing him more time to arrange for further care needs once she returns home, uncertainty of her ability to rehabilitate effectively, possible infections and worsening quality of life.  He is having a harder time caring for her over the past 2 weeks.  He realizes he may soon be time to consider hospice, although he would prefer to begin with palliative care support and see how she responds with at home NIV for oxygenation.   The difference between aggressive medical intervention and comfort care was considered in light of the patient's goals of care. Hospice and Palliative Care services outpatient were explained and offered.   Discussed the importance of continued conversation with family and the medical providers regarding overall plan of care and treatment options, ensuring decisions are within the context of the patient's values and GOCs.   Questions and concerns were addressed.  Hard Choices booklet left for review. The family was encouraged to call with questions or concerns.  PMT will continue to support holistically.   SUMMARY OF RECOMMENDATIONS   -DNR confirmed -Continue current interventions for now including BiPAP, further decision making will be based on patient's cognitive status and participation in discussions if she improves -Patient's son is realistic about overall disease trajectory, goal is to continue caring for her at home as long as he is able with initiation of palliative care support for eventual transition to hospice -Patient's son is also willing to consider SNF placement for  short-term rehabilitation only, would not consider long-term facility -Ongoing goals of care conversations pending clinical course -Psychosocial emotional support provided -PMT will continue to follow and support   Prognosis:  Guarded with poor long-term prognosis given chronic comorbidities, functional and cognitive decline, recurrent hospitalization, advanced age  Discharge Planning: To Be Determined      Primary Diagnoses: Present on Admission:  Acute on chronic respiratory failure with hypoxia and hypercapnia (HCC)  COPD (chronic obstructive pulmonary disease) (HCC)  (Resolved) CKD (chronic kidney disease) stage 3, GFR 30-59 ml/min (HCC)  Normocytic anemia  Morbid obesity (HCC)  Hyponatremia  Hypertension, essential  Hyperkalemia  Acute on chronic diastolic CHF (congestive heart failure) (HCC)  Leukocytosis  Diabetes with neurologic complications [facility])   Physical Exam Vitals and nursing note reviewed.  Constitutional:      Appearance: She is ill-appearing.     Comments: Does not open eyes or respond to my voice  Cardiovascular:     Rate and Rhythm: Normal rate.  Pulmonary:     Effort: No respiratory distress.     Comments: On BiPAP Skin:    General: Skin is warm and dry.  Neurological:     Mental Status: She is lethargic.     Vital Signs: BP (!) 114/51   Pulse 67   Temp 98.2 F (36.8 C)   Resp 18   SpO2  92%  Pain Scale: 0-10   Pain Score: 0-No pain   SpO2: SpO2: 92 % O2 Device:SpO2: 92 % O2 Flow Rate: .O2 Flow Rate (L/min): 4 L/min   Palliative Assessment/Data: 40%      Total time: I spent 95 minutes in the care of the patient today in the above activities and documenting the encounter.    Luiz Trumpower Johnnette Litter, PA-C  Palliative Medicine Team Team phone # [phone]  Thank you for allowing the Palliative Medicine Team to assist in the care of this patient. Please utilize secure chat with additional questions, if there is no response  within 30 minutes please call the above phone number.  Palliative Medicine Team providers are available by phone from 7am to 7pm daily and can be reached through the team cell phone.  Should this patient require assistance outside of these hours, please call the patient's attending physician.

## 2022-06-13 NOTE — Plan of Care (Signed)
Patient transported from ER to 5W 10 on NIV V60 without issue.

## 2022-06-14 ENCOUNTER — Encounter (HOSPITAL_COMMUNITY): Payer: Self-pay | Admitting: Internal Medicine

## 2022-06-14 DIAGNOSIS — N1832 Chronic kidney disease, stage 3b: Secondary | ICD-10-CM | POA: Diagnosis not present

## 2022-06-14 DIAGNOSIS — I509 Heart failure, unspecified: Secondary | ICD-10-CM

## 2022-06-14 DIAGNOSIS — I5033 Acute on chronic diastolic (congestive) heart failure: Secondary | ICD-10-CM | POA: Diagnosis not present

## 2022-06-14 DIAGNOSIS — N289 Disorder of kidney and ureter, unspecified: Secondary | ICD-10-CM | POA: Diagnosis not present

## 2022-06-14 DIAGNOSIS — J9622 Acute and chronic respiratory failure with hypercapnia: Secondary | ICD-10-CM | POA: Diagnosis not present

## 2022-06-14 DIAGNOSIS — J9621 Acute and chronic respiratory failure with hypoxia: Secondary | ICD-10-CM | POA: Diagnosis not present

## 2022-06-14 LAB — CBC
HCT: 31.4 % — ABNORMAL LOW (ref 36.0–46.0)
Hemoglobin: 9.4 g/dL — ABNORMAL LOW (ref 12.0–15.0)
MCH: 27.4 pg (ref 26.0–34.0)
MCHC: 29.9 g/dL — ABNORMAL LOW (ref 30.0–36.0)
MCV: 91.5 fL (ref 80.0–100.0)
Platelets: 200 K/uL (ref 150–400)
RBC: 3.43 MIL/uL — ABNORMAL LOW (ref 3.87–5.11)
RDW: 15.9 % — ABNORMAL HIGH (ref 11.5–15.5)
WBC: 8 K/uL (ref 4.0–10.5)
nRBC: 0 % (ref 0.0–0.2)

## 2022-06-14 LAB — GLUCOSE, CAPILLARY
Glucose-Capillary: 81 mg/dL (ref 70–99)
Glucose-Capillary: 80 mg/dL (ref 70–99)
Glucose-Capillary: 171 mg/dL — ABNORMAL HIGH (ref 70–99)
Glucose-Capillary: 132 mg/dL — ABNORMAL HIGH (ref 70–99)
Glucose-Capillary: 151 mg/dL — ABNORMAL HIGH (ref 70–99)

## 2022-06-14 LAB — BASIC METABOLIC PANEL
Anion gap: 9 (ref 5–15)
BUN: 47 mg/dL — ABNORMAL HIGH (ref 8–23)
CO2: 28 mmol/L (ref 22–32)
Calcium: 8.6 mg/dL — ABNORMAL LOW (ref 8.9–10.3)
Chloride: 99 mmol/L (ref 98–111)
Creatinine, Ser: 1.52 mg/dL — ABNORMAL HIGH (ref 0.44–1.00)
GFR, Estimated: 33 mL/min — ABNORMAL LOW (ref >60)
Glucose, Bld: 83 mg/dL (ref 70–99)
Potassium: 4.9 mmol/L (ref 3.5–5.1)
Sodium: 136 mmol/L (ref 135–145)

## 2022-06-14 MED ORDER — HYDRALAZINE HCL 20 MG/ML IJ SOLN: 10.0000 mg | INTRAMUSCULAR | Status: DC | PRN

## 2022-06-14 MED ORDER — ALPRAZOLAM 0.5 MG PO TABS
0.2500 mg | ORAL_TABLET | Freq: Once | ORAL | Status: AC
  Administered 2022-06-14: 0.25 mg via ORAL
  Filled 2022-06-14: qty 1

## 2022-06-14 MED ORDER — METOPROLOL TARTRATE 5 MG/5ML IV SOLN: 5.0000 mg | INTRAVENOUS | Status: DC | PRN

## 2022-06-14 MED ORDER — SENNOSIDES-DOCUSATE SODIUM 8.6-50 MG PO TABS
1.0000 | ORAL_TABLET | Freq: Every evening | ORAL | Status: DC | PRN
  Administered 2022-06-17: 1 via ORAL
  Filled 2022-06-14: qty 1

## 2022-06-14 MED ORDER — GUAIFENESIN 100 MG/5ML PO LIQD: 5.0000 mL | ORAL | Status: DC | PRN

## 2022-06-14 NOTE — Evaluation (Signed)
Physical Therapy Evaluation Patient Details Name: [name] MRN: [id number] DOB: Jul 04, 1931 Today's Date: 06/14/2022  History of Present Illness  Pt is an 88 year old woman admitted on 06/13/22 with acute on chronic hypoxia and hypercapnea secondary to dCHF exacerbation. Pt requiring bipap upon admission. Admitted last month with similar presentation PMH: CKD, COPD on 3L 02, CHF, pericardial effusion, HTN, anasarca, DM, morbid obesity.  Clinical Impression   Pt presents with generalized weakness, impaired activity tolerance, impaired balance, and dyspnea on exertion with accompanying desats. Pt to benefit from acute PT to address deficits. Pt overall min +2 for transfer-level mobility at this time, SpO2 min 83% on 6LO2 during session with recovery with cuing on pursed lip breathing. PT to progress mobility as tolerated, and will continue to follow acutely.         Recommendations for follow up therapy are one component of a multi-disciplinary discharge planning process, led by the attending physician.  Recommendations may be updated based on patient status, additional functional criteria and insurance authorization.  Follow Up Recommendations Home health PT      Assistance Recommended at Discharge Frequent or constant Supervision/Assistance  Patient can return home with the following  A little help with walking and/or transfers;A little help with bathing/dressing/bathroom;Assistance with cooking/housework;Direct supervision/assist for medications management;Assist for transportation;Help with stairs or ramp for entrance    Equipment Recommendations None recommended by PT  Recommendations for Other Services       Functional Status Assessment Patient has had a recent decline in their functional status and demonstrates the ability to make significant improvements in function in a reasonable and predictable amount of time.     Precautions / Restrictions Precautions Precautions:  Fall Precaution Comments: pt on chronic 3LO2 - on 6LO2 presently Restrictions Weight Bearing Restrictions: No      Mobility  Bed Mobility Overal bed mobility: Needs Assistance Bed Mobility: Supine to Sit     Supine to sit: Min assist, HOB elevated     General bed mobility comments: assist for trunk    Transfers Overall transfer level: Needs assistance   Transfers: Sit to/from Stand, Bed to chair/wheelchair/BSC Sit to Stand: +2 physical assistance, Min assist   Step pivot transfers: +2 physical assistance, Min assist       General transfer comment: pt preferring to step pivot without use of walker, assist to rise and steady    Ambulation/Gait                  Stairs            Wheelchair Mobility    Modified Rankin (Stroke Patients Only)       Balance Overall balance assessment: Needs assistance   Sitting balance-Leahy Scale: Poor Sitting balance - Comments: posterior and L side lean     Standing balance-Leahy Scale: Poor                               Pertinent Vitals/Pain Pain Assessment Pain Assessment: Faces Faces Pain Scale: Hurts a little bit Pain Location: L LE Pain Descriptors / Indicators: Discomfort Pain Intervention(s): Limited activity within patient's tolerance, Monitored during session, Repositioned    Home Living Family/patient expects to be discharged to:: Private residence Living Arrangements: Children (son) Available Help at Discharge: Available 24 hours/day;Family Type of Home: House Home Access: Ramped entrance       Home Layout: Two level;Able to live on main level with bedroom/bathroom  Home Equipment: Wheelchair - manual;Grab bars - tub/shower;Rolling Environmental consultant (2 wheels);Hand held shower head;Adaptive equipment      Prior Function Prior Level of Function : Needs assist             Mobility Comments: transfers independently to [facility] and w/c, does not typically use walker ADLs Comments: assisted  for LB ADLs and all IADLs, pt was able to walk into her narrow bathroom prior to last admission, but now uses a BSC and sponge bathes     Hand Dominance   Dominant Hand: Right    Extremity/Trunk Assessment   Upper Extremity Assessment Upper Extremity Assessment: Defer to OT evaluation    Lower Extremity Assessment Lower Extremity Assessment: Generalized weakness    Cervical / Trunk Assessment Cervical / Trunk Assessment: Other exceptions Cervical / Trunk Exceptions: obesity  Communication   Communication: No difficulties  Cognition Arousal/Alertness: Awake/alert Behavior During Therapy: WFL for tasks assessed/performed, Flat affect Overall Cognitive Status: Within Functional Limits for tasks assessed                                 General Comments: anxiety about falling        General Comments General comments (skin integrity, edema, etc.): on 6LO2 with SpO2 min 83% post-transfer and when speaking, cues for pursed lip breathing with recovery of SpO2 to >90%    Exercises     Assessment/Plan    PT Assessment Patient needs continued PT services  PT Problem List Decreased strength;Decreased activity tolerance;Decreased balance;Decreased mobility;Decreased coordination;Decreased knowledge of use of DME;Decreased safety awareness;Decreased knowledge of precautions;Cardiopulmonary status limiting activity       PT Treatment Interventions DME instruction;Gait training;Functional mobility training;Therapeutic activities;Balance training;Therapeutic exercise;Neuromuscular re-education;Patient/family education    PT Goals (Current goals can be found in the Care Plan section)  Acute Rehab PT Goals Patient Stated Goal: d.c home PT Goal Formulation: With patient Time For Goal Achievement: 06/28/22 Potential to Achieve Goals: Good    Frequency Min 3X/week     Co-evaluation   Reason for Co-Treatment: For patient/therapist safety;To address functional/ADL  transfers PT goals addressed during session: Mobility/safety with mobility OT goals addressed during session: ADL's and self-care       AM-PAC PT "6 Clicks" Mobility  Outcome Measure Help needed turning from your back to your side while in a flat bed without using bedrails?: A Little Help needed moving from lying on your back to sitting on the side of a flat bed without using bedrails?: A Little Help needed moving to and from a bed to a chair (including a wheelchair)?: A Little Help needed standing up from a chair using your arms (e.g., wheelchair or bedside chair)?: A Little Help needed to walk in hospital room?: Total Help needed climbing 3-5 steps with a railing? : Total 6 Click Score: 14    End of Session Equipment Utilized During Treatment: Oxygen Activity Tolerance: Patient tolerated treatment well;Patient limited by fatigue Patient left: in chair;with call bell/phone within reach;with chair alarm set Nurse Communication: Mobility status PT Visit Diagnosis: Other abnormalities of gait and mobility (R26.89)    Time: 2952-8413 PT Time Calculation (min) (ACUTE ONLY): 28 min   Charges:   PT Evaluation $PT Eval Low Complexity: 1 Low         Coyle Stordahl S, PT DPT Acute Rehabilitation Services Pager [phone]  Office [phone]   Roxine Caddy E Ruffin Pyo 06/14/2022, 3:40 PM

## 2022-06-14 NOTE — Progress Notes (Signed)
Progress Note  Patient Name: [name] Date of Encounter: 06/15/2022  [facility] Cardiologist: Mertie Moores, MD   Subjective   Feels much better today. Breathing improved. Cr stable 1.52>1.46  Inpatient Medications    Scheduled Meds:  acidophilus  1 capsule Oral Daily   aspirin EC  81 mg Oral q morning   atorvastatin  40 mg Oral Daily   diclofenac Sodium  1 Application Topical QHS   enoxaparin (LOVENOX) injection  30 mg Subcutaneous Q24H   fluticasone furoate-vilanterol  1 puff Inhalation QPM   furosemide  60 mg Intravenous BID   gabapentin  100 mg Oral BID   hydrALAZINE  25 mg Oral BID   insulin aspart  0-15 Units Subcutaneous TID WC   insulin glargine-yfgn  10 Units Subcutaneous QHS   metoprolol tartrate  25 mg Oral BID   sodium chloride flush  3 mL Intravenous Q12H   spironolactone  25 mg Oral Daily   Continuous Infusions:  PRN Meds: acetaminophen **OR** acetaminophen, albuterol, guaiFENesin, hydrALAZINE, LORazepam, metoprolol tartrate, polyvinyl alcohol, senna-docusate   Vital Signs    Vitals:   06/15/22 0733 06/15/22 0800 06/15/22 0900 06/15/22 1000  BP:  (!) 120/49    Pulse: 88 97  83  Resp: '15 20 18 '$ (!) 23  Temp:      TempSrc:      SpO2: 94%  94% 94%  Weight:      Height:        Intake/Output Summary (Last 24 hours) at 06/15/2022 1210 Last data filed at 06/15/2022 1159 Gross per 24 hour  Intake --  Output 3950 ml  Net -3950 ml      06/14/2022    4:33 AM 06/13/2022    6:30 PM 06/04/2022    3:47 PM  Last 3 Weights  Weight (lbs) 217 lb 6 oz 217 lb 6 oz 206 lb 12.8 oz  Weight (kg) 98.6 kg 98.6 kg 93.804 kg      Telemetry    NSR/sinus tach, occasional PVCs - Personally Reviewed  ECG    No new tracing today- Personally Reviewed  Physical Exam   GEN: Sitting up in bed, awake and alert   Neck: JVD difficult to assess due to body habitus Cardiac: RRR, no murmurs, rubs, or gallops.  Respiratory: Faint crackles at the bases GI: Soft,  nontender, non-distended  MS: Trace-1+ pitting edema, warm Neuro:  Nonfocal  Psych: Normal affect   Labs    High Sensitivity Troponin:   Recent Labs  Lab 06/13/22 0727 06/13/22 0930  TROPONINIHS 13 14     Chemistry Recent Labs  Lab 06/13/22 0727 06/13/22 0738 06/13/22 0930 06/13/22 1103 06/14/22 0346 06/15/22 0315  NA 134*   < >  --  132* 136 140  K 5.2*   < >  --  5.3* 4.9 4.5  CL 96*  --   --   --  99 98  CO2 29  --   --   --  28 34*  GLUCOSE 189*  --   --   --  83 114*  BUN 42*  --   --   --  47* 43*  CREATININE 1.75*  --   --   --  1.52* 1.46*  CALCIUM 8.7*  --   --   --  8.6* 8.9  MG 2.2  --  2.4  --   --  2.1  PROT 5.6*  --   --   --   --   --  ALBUMIN 2.9*  --   --   --   --   --   AST 17  --   --   --   --   --   ALT 18  --   --   --   --   --   ALKPHOS 69  --   --   --   --   --   BILITOT 0.7  --   --   --   --   --   GFRNONAA 28*  --   --   --  33* 34*  ANIONGAP 9  --   --   --  9 8   < > = values in this interval not displayed.    Lipids No results for input(s): "CHOL", "TRIG", "HDL", "LABVLDL", "LDLCALC", "CHOLHDL" in the last 168 hours.  Hematology Recent Labs  Lab 06/13/22 0727 06/13/22 0738 06/13/22 1103 06/14/22 0346 06/15/22 0315  WBC 11.8*  --   --  8.0 8.8  RBC 3.54*  --   --  3.43* 3.39*  HGB 9.8*   < > 10.5* 9.4* 9.4*  HCT 32.4*   < > 31.0* 31.4* 30.5*  MCV 91.5  --   --  91.5 90.0  MCH 27.7  --   --  27.4 27.7  MCHC 30.2  --   --  29.9* 30.8  RDW 15.9*  --   --  15.9* 15.9*  PLT 233  --   --  200 211   < > = values in this interval not displayed.   Thyroid No results for input(s): "TSH", "FREET4" in the last 168 hours.  BNP Recent Labs  Lab 06/13/22 0727  BNP 732.5*    DDimer No results for input(s): "DDIMER" in the last 168 hours.   Radiology    No results found.  Cardiac Studies   Echocardiogram 05/14/22 1. Left ventricular ejection fraction, by estimation, is 60 to 65%. The  left ventricle has normal function. The  left ventricle has no regional  wall motion abnormalities. There is mild concentric left ventricular  hypertrophy. Left ventricular diastolic  parameters are consistent with Grade II diastolic dysfunction  (pseudonormalization). Elevated left atrial pressure.   2. Right ventricular systolic function is normal. The right ventricular  size is normal. Tricuspid regurgitation signal is inadequate for assessing  PA pressure.   3. Left atrial size was moderately dilated.   4. The mitral valve is degenerative. Trivial mitral valve regurgitation.  No evidence of mitral stenosis. Moderate mitral annular calcification.   5. The aortic valve is tricuspid. There is mild calcification of the  aortic valve. There is mild thickening of the aortic valve. Aortic valve  regurgitation is not visualized. Aortic valve sclerosis/calcification is  present, without any evidence of  aortic stenosis.   6. The inferior vena cava is normal in size with greater than 50%  respiratory variability, suggesting right atrial pressure of 3 mmHg.   Comparison(s): Prior images reviewed side by side. There is evidence of  high mean left atrial pressure, based on E/e' ratio.   Patient Profile     88 y.o. female with history of diastolic CHF, CAD, COPD on 2-3 L oxygen at baseline, HTN, HLD, type 2 DM, morbid obesity who presented with hypoxia and hypercarbia consistent with acute on chronic respiratory failure. Cardiology is now consulted for acute on chronic diastolic heart failure.    Assessment & Plan    Acute on Chronic Diastolic Heart  Failure Acute on chronic respiratory failure with hypercarbia -Most recent echocardiogram from 05/14/2022 showed EF 60-65%, no regional wall motion abnormalities, grade 2 diastolic dysfunction, normal RV systolic function.  - Patient presented to the ED with altered mental status/lethargy.  Per son, patient woke up on the day of admission with panic, slid off of the bed onto her bottom.   Home oxygen was disconnected, EMS found that patient was hypoxic with oxygen saturations in the 70s.  Started on BiPAP with improvement - Initial ABG showed pH 7.199, pCO2 83.4, pO2 64 and patient was placed on BiPAP as above - BNP elevated to 730 (increased from 90.1 on 05/19/22); CXR with pulmonary edema - Diuresing well on lasix '60mg'$  IV BID; will continue - Possibly transition to PO diuretics tomorrow - Monitor I and Os and daily weights - Palliative care consulted   AKI on CKD stage IIIb - Baseline creatinine 1.4-1.5 - Improved with diuresis; will continue as above   Suspected OSA  - Needs outpatient sleep study   Otherwise per primary  - Acute metabolic encephalopathy/lethargy  - COPD - no acute exacerbation  - Leukocytosis  - Type 2 DM  - Normocytic anemia       For questions or updates, please contact [facility] Please consult [email or web address] for contact info under        Signed, Freada Bergeron, MD  06/15/2022, 12:10 PM

## 2022-06-14 NOTE — TOC Initial Note (Signed)
Transition of Care [facility]) - Initial/Assessment Note    Patient Details  Name: [name] MRN: [id number] Date of Birth: 1931/04/23  Transition of Care [facility]) CM/SW Contact:    Cyndi Bender, RN Phone Number: 06/14/2022, 6:28 PM  Clinical Narrative:                 TOC following patient for high risk for readmission. Patient recently discharged on 05/21/22.  Spoke with patient's son, [name]. [name] states [facility] never contacted him for services. This RNCM spoke to [place] with [facility] and he stated that the it was the office's fault and apologized. [name] doesn't want to use [facility] for home health in the future. [name] states that the NIV was never delivered but he states " It is my fault due to me keep putting it off, I was planning on having it delivered today."  Son is agreeable for patient to discharge with home health and out patient palliative services and is requesting agencies list for both. This RNCM spoke to son at 11 after leaving the hospital Therefore this RNCM will leave handoff for weekend [facility] to give son the list.  [place] with Thedore Mins with [facility] regarding the NIV delivery. Thedore Mins states they contacted [name] 3 times to arranged a time to deliver and setup NIV. [name] stopped answering calls or text from Thedore Mins and Thurmond Butts the RT.  [place] is requesting [facility] delivers the NIV to the hospital. This RNCM will notify Thedore Mins with [facility].   TOC will continue to follow for needs.  Expected Discharge Plan: [place]  Barriers to Discharge: Continued Medical Work up   Patient Goals and CMS Choice        Expected Discharge Plan and Services Expected Discharge Plan: [place]       Living arrangements for the past 2 months: Single Family Home                                      Prior Living Arrangements/Services Living arrangements for the past 2 months: Single Family Home Lives with:: Adult Children               Current home services: DME (NIV, nebulizer, home 02)    Activities of Daily Living Home Assistive Devices/Equipment: Wheelchair ADL Screening (condition at time of admission) Patient's cognitive ability adequate to safely complete daily activities?: Yes Is the patient deaf or have difficulty hearing?: No Does the patient have difficulty seeing, even when wearing glasses/contacts?: No Does the patient have difficulty concentrating, remembering, or making decisions?: No Patient able to express need for assistance with ADLs?: Yes Does the patient have difficulty dressing or bathing?: Yes Independently performs ADLs?: No Communication: Independent Does the patient have difficulty walking or climbing stairs?: Yes Weakness of Legs: Both Weakness of Arms/Hands: Both  Permission Sought/Granted                  Emotional Assessment              Admission diagnosis:  Acute on chronic respiratory failure with hypoxia and hypercapnia (HCC) [N47.09, J96.22] Acute on chronic congestive heart failure, unspecified heart failure type ([place]) [I50.9] Patient Active Problem List   Diagnosis Date Noted   Hyperkalemia 06/13/2022   Leukocytosis 06/13/2022   Acute on chronic renal insufficiency 06/13/2022   Urinary frequency 06/05/2022   Urinary  tract infection due to Pseudomonas aeruginosa 05/18/2022   Hyponatremia 05/15/2022   Normocytic anemia 05/15/2022   Acute on chronic respiratory failure with hypoxia and hypercapnia ([place]) 05/14/2022   DNR (do not resuscitate) 05/14/2022   Dysuria 02/15/2021   Suspected sleep apnea 11/08/2020   Hearing loss of left ear due to cerumen impaction 09/22/2020   Osteoarthritis 09/22/2020   Gout 03/31/2020   Morbid obesity ([place]) 09/21/2019   Acute kidney injury superimposed on chronic kidney disease ([place]) 81/44/8185   Acute metabolic encephalopathy 63/14/9702   Anemia of chronic renal failure, stage 2 (mild) 02/18/2017   Diabetic  polyneuropathy associated with type 2 diabetes mellitus ([place]) 02/18/2017   Vitamin D deficiency 02/18/2017   COPD (chronic obstructive pulmonary disease) ([place])    Acute on chronic diastolic CHF (congestive heart failure) ([place])    Pulmonary hypertension ([place])    Hypertensive retinopathy of both eyes 09/26/2015   Pseudophakia of both eyes 09/26/2015   Diabetes with neurologic complications ([place]) 63/78/5885   Mixed dyslipidemia 03/18/2011   Hypertension, essential    PCP:  Hoyt Koch, MD Pharmacy:   [facility] [address] [place], [place] [facility] [place] [address] [place] [address] Phone: [phone] Fax: [phone]  [facility] [address] [address] Phone: [phone] Fax: [phone]  [facility] [place] [place] - [address] [place] [address] Phone: [phone] Fax: [phone]     Social Determinants of Health (SDOH) Interventions    Readmission Risk Interventions    11/07/2020    3:49 PM  Readmission Risk Prevention Plan  Transportation Screening Complete  PCP or Specialist Appt within 3-5 Days Complete  HRI or Home Care Consult Patient refused  Social Work Consult for [place] Planning/Counseling Complete  Palliative Care Screening Not Applicable

## 2022-06-14 NOTE — Progress Notes (Signed)
Heart Failure Navigator Progress Note  Assessed for Heart & Vascular TOC clinic readiness.  Patient does not meet criteria due to . Palliative care goals with eventual transition to hospice.   Navigator available for reassessment of patient.   [name], BSN, Clinical cytogeneticist Only

## 2022-06-14 NOTE — Progress Notes (Signed)
PROGRESS NOTE    [name]  OMV:[id number] DOB: December 25, 1930 DOA: 06/13/2022 PCP: Hoyt Koch, MD   Brief Narrative:  88 year old with history of HTN, HLD, CAD, diastolic CHF, COPD on 2-3 L nasal cannula, DM2, morbid obesity presented with shortness of breath.  Initially patient was found to be lethargic.  She was recently admitted for hypoxic respiratory failure requiring BiPAP which was thought to be due to fluid overload.   Assessment & Plan:  Principal Problem:   Acute on chronic respiratory failure with hypoxia and hypercapnia (HCC) Active Problems:   Acute on chronic diastolic CHF (congestive heart failure) (HCC)   Acute metabolic encephalopathy   Leukocytosis   Hyperkalemia   COPD (chronic obstructive pulmonary disease) (HCC)   Acute on chronic renal insufficiency   Diabetes with neurologic complications (HCC)   Hypertension, essential   Hyponatremia   Normocytic anemia   Morbid obesity (HCC)    Acute on chronic respiratory failure with hypoxia and hypercapnia secondary to acute on chronic diastolic congestive heart failure, EF 65%, grade 2 DD -Hypoxia secondary to fluid overload, chest x-ray showing pulmonary edema and ABG showing pCO2 retention.  Patient currently started on IV Lasix twice daily, cardiology team consulted.  Doing slightly better this morning therefore we will wean her off BiPAP and place her on nasal cannula.  Allow p.o. intake.  If she is able to remain on nasal cannula, PT/OT.  Acute metabolic encephalopathy -Secondary to CO2 narcosis.  Leukocytosis -Suspect reactive in nature.  Resolved    Hyperkalemia Resolved   COPD, without exacerbation Not an active exacerbation, continue home bronchodilators   Renal insufficiency superimposed chronic kidney disease stage IIIb Around baseline of 1.5.  Continue to monitor   Controlled diabetes mellitus type 2, with hyperglycemia -Last A1c 6.9 05/15/2022.  Semglee 10 units at bedtime, sliding  scale.  Adjust as needed   Essential hypertension Continue Lopressor 25 mg twice daily, hydralazine 25 mg twice daily.  Also getting Lasix.  Will place IV hydralazine orders   Hyponatremia Acute.  Sodium 133.  Suspect hypervolemic hyponatremia given patient's fluid overloaded status. -Continue to monitor sodium levels   Normocytic anemia Hemoglobin at baseline of 9.5    Hyperlipidemia --Lipitor     Suspected sleep apnea  Needs outpatient sleep study  Goals of care discussion.  Seen by palliative care.  Patient is DNR    DVT prophylaxis: enoxaparin (LOVENOX) injection 30 mg Start: 06/13/22 1000 Code Status: DNR Family Communication:    Status is: Inpatient Remains inpatient appropriate because: Working on weaning her off BiPAP thereafter nasal cannula.  In the meantime aggressive IV diuretics.    Subjective: Seen and examined at bedside, feels much better this morning.  She is mentating well, requesting to be taken off BiPAP so she can eat her breakfast.   Examination:  General exam: On BiPAP Respiratory system: Some bibasilar crackles Cardiovascular system: S1 & S2 heard, RRR. No JVD, murmurs, rubs, gallops or clicks. No pedal edema. Gastrointestinal system: Abdomen is nondistended, soft and nontender. No organomegaly or masses felt. Normal bowel sounds heard. Central nervous system: Alert and oriented. No focal neurological deficits. Extremities: Symmetric 5 x 5 power. Skin: No rashes, lesions or ulcers Psychiatry: Judgement and insight appear normal. Mood & affect appropriate.     Objective: Vitals:   06/14/22 0400 06/14/22 0419 06/14/22 0433 06/14/22 0821  BP:  (!) 122/53  (!) 122/52  Pulse: 79 65    Resp: '15 15  16  '$ Temp:  97.8 F (36.6 C)    TempSrc:  Oral    SpO2: 97% 98%    Weight:   98.6 kg     Intake/Output Summary (Last 24 hours) at 06/14/2022 0829 Last data filed at 06/14/2022 0530 Gross per 24 hour  Intake 0 ml  Output 977 ml  Net -977 ml    Filed Weights   06/14/22 0433  Weight: 98.6 kg     Data Reviewed:   CBC: Recent Labs  Lab 06/13/22 0727 06/13/22 0738 06/13/22 1103 06/14/22 0346  WBC 11.8*  --   --  8.0  NEUTROABS 9.9*  --   --   --   HGB 9.8* 11.2* 10.5* 9.4*  HCT 32.4* 33.0* 31.0* 31.4*  MCV 91.5  --   --  91.5  PLT 233  --   --  662   Basic Metabolic Panel: Recent Labs  Lab 06/13/22 0727 06/13/22 0738 06/13/22 0930 06/13/22 1103 06/14/22 0346  NA 134* 133*  --  132* 136  K 5.2* 5.2*  --  5.3* 4.9  CL 96*  --   --   --  99  CO2 29  --   --   --  28  GLUCOSE 189*  --   --   --  83  BUN 42*  --   --   --  47*  CREATININE 1.75*  --   --   --  1.52*  CALCIUM 8.7*  --   --   --  8.6*  MG 2.2  --  2.4  --   --    GFR: Estimated Creatinine Clearance: 29.2 mL/min (A) (by C-G formula based on SCr of 1.52 mg/dL (H)). Liver Function Tests: Recent Labs  Lab 06/13/22 0727  AST 17  ALT 18  ALKPHOS 69  BILITOT 0.7  PROT 5.6*  ALBUMIN 2.9*   No results for input(s): "LIPASE", "AMYLASE" in the last 168 hours. No results for input(s): "AMMONIA" in the last 168 hours. Coagulation Profile: No results for input(s): "INR", "PROTIME" in the last 168 hours. Cardiac Enzymes: No results for input(s): "CKTOTAL", "CKMB", "CKMBINDEX", "TROPONINI" in the last 168 hours. BNP (last 3 results) No results for input(s): "PROBNP" in the last 8760 hours. HbA1C: No results for input(s): "HGBA1C" in the last 72 hours. CBG: Recent Labs  Lab 06/13/22 1231 06/13/22 1233 06/13/22 1602 06/13/22 2106 06/14/22 0421  GLUCAP 180* 188* 152* 98 81   Lipid Profile: No results for input(s): "CHOL", "HDL", "LDLCALC", "TRIG", "CHOLHDL", "LDLDIRECT" in the last 72 hours. Thyroid Function Tests: No results for input(s): "TSH", "T4TOTAL", "FREET4", "T3FREE", "THYROIDAB" in the last 72 hours. Anemia Panel: No results for input(s): "VITAMINB12", "FOLATE", "FERRITIN", "TIBC", "IRON", "RETICCTPCT" in the last 72  hours. Sepsis Labs: No results for input(s): "PROCALCITON", "LATICACIDVEN" in the last 168 hours.  Recent Results (from the past 240 hour(s))  Urine Culture     Status: None   Collection Time: 06/04/22  4:23 PM   Specimen: Blood  Result Value Ref Range Status   Source: URINE  Final   Status: FINAL  Final   Result:   Final    Mixed genital flora isolated. These superficial bacteria are not indicative of a urinary tract infection. No further organism identification is warranted on this specimen. If clinically indicated, recollect clean-catch, mid-stream urine and transfer  immediately to Urine Culture Transport Tube.          Radiology Studies: DG Chest Portable 1 View  Result Date:  06/13/2022 CLINICAL DATA:  88 year old female with history of dyspnea. Shortness of breath. EXAM: PORTABLE CHEST 1 VIEW COMPARISON:  Chest x-ray 05/14/2022. FINDINGS: There is cephalization of the pulmonary vasculature and slight indistinctness of the interstitial markings suggestive of mild pulmonary edema. Mild blunting of the costophrenic sulci suggestive of trace bilateral pleural effusions. No pneumothorax. Heart size is moderately enlarged. Upper mediastinal contours are distorted by patient's rotation to the right. IMPRESSION: 1. The appearance the chest suggests mild congestive heart failure, as above. Electronically Signed   By: Vinnie Langton M.D.   On: 06/13/2022 07:53        Scheduled Meds:  acidophilus  1 capsule Oral Daily   aspirin EC  81 mg Oral q morning   atorvastatin  40 mg Oral Daily   diclofenac Sodium  1 Application Topical QHS   enoxaparin (LOVENOX) injection  30 mg Subcutaneous Q24H   fluticasone furoate-vilanterol  1 puff Inhalation QPM   furosemide  60 mg Intravenous BID   gabapentin  100 mg Oral BID   hydrALAZINE  25 mg Oral BID   insulin aspart  0-15 Units Subcutaneous TID WC   insulin glargine-yfgn  10 Units Subcutaneous QHS   metoprolol tartrate  25 mg Oral BID    sodium chloride flush  3 mL Intravenous Q12H   Continuous Infusions:   LOS: 1 day   Time spent= 35 mins    Madeline Pho Arsenio Loader, MD [facility]  If 7PM-7AM, please contact night-coverage  06/14/2022, 8:29 AM

## 2022-06-14 NOTE — Progress Notes (Signed)
Daily Progress Note   Patient Name: [name]       Date: 06/14/2022 DOB: 1931-04-14  Age: 88 y.o. MRN#: [id number] Attending Physician: Damita Lack, MD Primary Care Physician: Hoyt Koch, MD Admit Date: 06/13/2022  Reason for Consultation/Follow-up: Establishing goals of care  Subjective: Medical records reviewed including progress notes, labs. Patient assessed at the bedside.  She is sitting in bedside chair and reports feeling well today. On 6L nasal cannula in no respiratory distress.  Met initially with only the patient and then returned to the bedside to discuss further with her son [name] present.  With [place], I introduced Palliative Medicine as specialized medical care for people living with serious illness. It focuses on providing relief from the symptoms and stress of a serious illness. The goal is to improve quality of life for both the patient and the family.    Provided patient education on her current illness including diastolic heart failure and lung disease.  She feels like the BiPAP is a "cage" and is happy to have weaned from this.  We then discussed her current care plan and treatment options including NIV when delivered and her thoughts on wearing this at night.  She states that "I am sure I will get used to it."  At the same time, she notes that her quality of life is poor and that she would be at peace if this option did not work out and her disease progressed further.  She states "I am not worried about it (her illness) getting me, I am worried about [name]."  She notes that he has worked himself to death and that she hopes she will "just go" one day rather than him needing to arrange for things like facility placement.  Explored her thoughts on short-term  and long-term facility placement.  She feels like she may have to consider this time and would be open to this, although she prefers to be home.  I clarified that it would likely be short-term facility for rehabilitation if anything were to be considered, as [name] says he would never agree to long-term placement.  During conversation with both patient and her son, we reviewed option of outpatient palliative care referral to follow along and support while patient continues to seek curative treatments with the hope of improvement.  [name] also received calls from [facility] during our visit and we discussed that PT has recommended home health at discharge.  He is interested in possibly having NIV delivered to the hospital for [facility] hospital liaison Thedore Mins to assist with education while inpatient.  He also wishes for patient to be considered for heart failure TOC clinic, as they are not yet ready for hospice at this time.  Questions and concerns addressed. PMT will continue to support holistically.   Length of Stay: 1   Physical Exam Vitals and nursing note reviewed.  Constitutional:      General: She is not in acute distress.    Interventions: Nasal cannula in place.     Comments: 6L  Pulmonary:     Effort: No respiratory distress.  Neurological:     Mental Status: She is alert.  Psychiatric:        Mood and Affect: Mood normal.        Speech: Speech normal.        Behavior: Behavior is cooperative.        Thought Content: Thought content normal.             Vital Signs: BP 123/60 (BP Location: Left Arm)   Pulse 86   Temp 98.4 F (36.9 C) (Oral)   Resp 19   Wt 98.6 kg   SpO2 (!) 89%   BMI 37.31 kg/m  SpO2: SpO2: (!) 89 % O2 Device: O2 Device: (S) Nasal Cannula O2 Flow Rate: O2 Flow Rate (L/min): 6 L/min      Palliative Assessment/Data: 50%    Palliative Care Assessment & Plan   Patient Profile: 88 y.o. female  with past medical history of hypertension, hyperlipidemia, CAD,  diastolic CHF, COPD on 2-3 L of oxygen, DM type II, and morbid obesity admitted on 06/13/2022 with dyspnea.    Patient had just been recently hospitalized from 7/11-7/18 for acute on chronic respiratory failure with hypoxia and hypercapnia requiring placement on BiPAP. Now in the ED for the same. PMT has been consulted to assist with goals of care conversation.  Assessment: Goals of care conversation Acute on chronic respiratory failure with hypoxia and hypercapnia, improving Acute on chronic diastolic CHF, improving Acute metabolic encephalopathy, improving Acute on chronic renal insufficiency  Recommendations/Plan: Continue DNR Continue current care Patient's son clarifies that he is open to all available resources and support, discussed with heart failure nurse navigator via secure chat to reevaluate for [facility] clinic readiness Goal is for improvement back to recent baseline functioning and improvement of quality of life, if she were to decline further or QOL worsened then would consider hospice at that time Discussed with Thedore Mins, [facility] hospital liaison, and he will arrange for NIV education with RT prior to admission, hopefully tomorrow Patient's son wishes to speak with Dr. Reesa Chew for an update, discussed via secure chat [facility] consulted for outpatient palliative care referral, assistance appreciated Psychosocial emotional support provided PMT will continue to follow and support   Prognosis:  Unable to determine  Discharge Planning: Home with Palliative Services/home health  Care plan was discussed with patient, patient's son, Dr. Reesa Chew, failure nurse navigator, [facility] hospital liaison   Total time: I spent 60 minutes in the care of the patient today in the above activities and documenting the encounter.          Nichols Corter Johnnette Litter, PA-C  Palliative Medicine Team Team phone # [phone]  Thank you for allowing the Palliative Medicine Team to assist in the care  of this patient.  Please utilize secure chat with additional questions, if there is no response within 30 minutes please call the above phone number.  Palliative Medicine Team providers are available by phone from 7am to 7pm daily and can be reached through the team cell phone.  Should this patient require assistance outside of these hours, please call the patient's attending physician.

## 2022-06-14 NOTE — Progress Notes (Signed)
Progress Note  Patient Name: [name] Date of Encounter: 06/14/2022  [facility] Cardiologist: Mertie Moores, MD   Subjective   Feeling much better this afternoon. Sitting up in a chair, awake and conversant. States breathing is improved.   Inpatient Medications    Scheduled Meds:  acidophilus  1 capsule Oral Daily   aspirin EC  81 mg Oral q morning   atorvastatin  40 mg Oral Daily   diclofenac Sodium  1 Application Topical QHS   enoxaparin (LOVENOX) injection  30 mg Subcutaneous Q24H   fluticasone furoate-vilanterol  1 puff Inhalation QPM   furosemide  60 mg Intravenous BID   gabapentin  100 mg Oral BID   hydrALAZINE  25 mg Oral BID   insulin aspart  0-15 Units Subcutaneous TID WC   insulin glargine-yfgn  10 Units Subcutaneous QHS   metoprolol tartrate  25 mg Oral BID   sodium chloride flush  3 mL Intravenous Q12H   Continuous Infusions:  PRN Meds: acetaminophen **OR** acetaminophen, albuterol, guaiFENesin, hydrALAZINE, LORazepam, metoprolol tartrate, polyvinyl alcohol, senna-docusate   Vital Signs    Vitals:   06/14/22 0821 06/14/22 0850 06/14/22 1000 06/14/22 1237  BP: (!) 122/52 (!) 122/52 (!) 122/52 123/60  Pulse:  79 74 86  Resp: '16 17 18 19  '$ Temp:  97.8 F (36.6 C)  98.4 F (36.9 C)  TempSrc:  Oral  Oral  SpO2:  98%  (!) 89%  Weight:        Intake/Output Summary (Last 24 hours) at 06/14/2022 1402 Last data filed at 06/14/2022 1236 Gross per 24 hour  Intake 240 ml  Output 1777 ml  Net -1537 ml      06/14/2022    4:33 AM 06/04/2022    3:47 PM 05/20/2022    5:00 AM  Last 3 Weights  Weight (lbs) 217 lb 6 oz 206 lb 12.8 oz 209 lb 14.1 oz  Weight (kg) 98.6 kg 93.804 kg 95.2 kg      Telemetry    NSR/sinus tach, occasional PVCs - Personally Reviewed  ECG    NSR with PVCs - Personally Reviewed  Physical Exam   GEN: Sitting up in a chair, awake and conversant.   Neck: JVD difficult to assess due to body habitus Cardiac: RRR, no murmurs,  rubs, or gallops.  Respiratory: Crackles at the bases GI: Soft, nontender, non-distended  MS: 2+ pitting edema, warm Neuro:  Nonfocal  Psych: Normal affect   Labs    High Sensitivity Troponin:   Recent Labs  Lab 06/13/22 0727 06/13/22 0930  TROPONINIHS 13 14     Chemistry Recent Labs  Lab 06/13/22 0727 06/13/22 0738 06/13/22 0930 06/13/22 1103 06/14/22 0346  NA 134* 133*  --  132* 136  K 5.2* 5.2*  --  5.3* 4.9  CL 96*  --   --   --  99  CO2 29  --   --   --  28  GLUCOSE 189*  --   --   --  83  BUN 42*  --   --   --  47*  CREATININE 1.75*  --   --   --  1.52*  CALCIUM 8.7*  --   --   --  8.6*  MG 2.2  --  2.4  --   --   PROT 5.6*  --   --   --   --   ALBUMIN 2.9*  --   --   --   --  AST 17  --   --   --   --   ALT 18  --   --   --   --   ALKPHOS 69  --   --   --   --   BILITOT 0.7  --   --   --   --   GFRNONAA 28*  --   --   --  33*  ANIONGAP 9  --   --   --  9    Lipids No results for input(s): "CHOL", "TRIG", "HDL", "LABVLDL", "LDLCALC", "CHOLHDL" in the last 168 hours.  Hematology Recent Labs  Lab 06/13/22 0727 06/13/22 0738 06/13/22 1103 06/14/22 0346  WBC 11.8*  --   --  8.0  RBC 3.54*  --   --  3.43*  HGB 9.8* 11.2* 10.5* 9.4*  HCT 32.4* 33.0* 31.0* 31.4*  MCV 91.5  --   --  91.5  MCH 27.7  --   --  27.4  MCHC 30.2  --   --  29.9*  RDW 15.9*  --   --  15.9*  PLT 233  --   --  200   Thyroid No results for input(s): "TSH", "FREET4" in the last 168 hours.  BNP Recent Labs  Lab 06/13/22 0727  BNP 732.5*    DDimer No results for input(s): "DDIMER" in the last 168 hours.   Radiology    DG Chest Portable 1 View  Result Date: 06/13/2022 CLINICAL DATA:  88 year old female with history of dyspnea. Shortness of breath. EXAM: PORTABLE CHEST 1 VIEW COMPARISON:  Chest x-ray 05/14/2022. FINDINGS: There is cephalization of the pulmonary vasculature and slight indistinctness of the interstitial markings suggestive of mild pulmonary edema. Mild blunting  of the costophrenic sulci suggestive of trace bilateral pleural effusions. No pneumothorax. Heart size is moderately enlarged. Upper mediastinal contours are distorted by patient's rotation to the right. IMPRESSION: 1. The appearance the chest suggests mild congestive heart failure, as above. Electronically Signed   By: Vinnie Langton M.D.   On: 06/13/2022 07:53    Cardiac Studies   Echocardiogram 05/14/22 1. Left ventricular ejection fraction, by estimation, is 60 to 65%. The  left ventricle has normal function. The left ventricle has no regional  wall motion abnormalities. There is mild concentric left ventricular  hypertrophy. Left ventricular diastolic  parameters are consistent with Grade II diastolic dysfunction  (pseudonormalization). Elevated left atrial pressure.   2. Right ventricular systolic function is normal. The right ventricular  size is normal. Tricuspid regurgitation signal is inadequate for assessing  PA pressure.   3. Left atrial size was moderately dilated.   4. The mitral valve is degenerative. Trivial mitral valve regurgitation.  No evidence of mitral stenosis. Moderate mitral annular calcification.   5. The aortic valve is tricuspid. There is mild calcification of the  aortic valve. There is mild thickening of the aortic valve. Aortic valve  regurgitation is not visualized. Aortic valve sclerosis/calcification is  present, without any evidence of  aortic stenosis.   6. The inferior vena cava is normal in size with greater than 50%  respiratory variability, suggesting right atrial pressure of 3 mmHg.   Comparison(s): Prior images reviewed side by side. There is evidence of  high mean left atrial pressure, based on E/e' ratio.   Patient Profile     88 y.o. female with history of diastolic CHF, CAD, COPD on 2-3 L oxygen at baseline, HTN, HLD, type 2 DM, morbid obesity who presented  with hypoxia and hypercarbia consistent with acute on chronic respiratory failure.  Cardiology is now consulted for acute on chronic diastolic heart failure.    Assessment & Plan    Acute on Chronic Diastolic Heart Failure Acute on chronic respiratory failure with hypercarbia -Most recent echocardiogram from 05/14/2022 showed EF 60-65%, no regional wall motion abnormalities, grade 2 diastolic dysfunction, normal RV systolic function.  - Patient presented to the ED with altered mental status/lethargy.  Per son, patient woke up this AM with panic, slid off of the bed onto her bottom.  Home oxygen was disconnected, EMS found that patient was hypoxic with oxygen saturations in the 70s.  Started on BiPAP with improvement - Initial ABG showed pH 7.199, pCO2 83.4, pO2 64 and patient was placed on BiPAP as above - BNP elevated to 730 (increased from 90.1 on 05/19/22); CXR with pulmonary edema - Diuresing well on lasix '60mg'$  IV BID - Monitor I and Os and daily weights - Palliative care consulted   AKI on CKD stage IIIb - Baseline creatinine 1.4-1.5 - Improving with diuresis; will continue as above   Suspected OSA  - Needs outpatient sleep study     Otherwise per primary  - Acute metabolic encephalopathy/lethargy  - COPD - no acute exacerbation  - Leukocytosis  - Type 2 DM  - Normocytic anemia       For questions or updates, please contact [facility] Please consult [email or web address] for contact info under        Signed, Freada Bergeron, MD  06/14/2022, 2:02 PM

## 2022-06-14 NOTE — Progress Notes (Signed)
RT removed BIPAP per verbal order and placed pt on 6L . No respiratory distress noted. BIPAP at bedside on standby if needed.

## 2022-06-14 NOTE — Consult Note (Signed)
   [facility] CM Inpatient Consult   06/14/2022  TERRENCE PIZANA [birth year] [id number]  [facility] [ACO] Patient: Medicare ACO REACH  Primary Care Provider:  Hoyt Koch, MD, [facility]    is an embedded provider with a Chronic Care Management team and program, and is listed for the transition of care follow up and appointments.  Patient was screened for Embedded practice service needs for chronic care management for high risk unplanned readmission risk.  Patient is being followed for disposition needs and review of consults and evaluations for post hospital recommendations/needs. Current disposition is not known.  Plan: Continue to follow for readmission prevention for TOC needs for post hospital, if applicable.   Please contact for further questions,  Natividad Brood, RN BSN [place] Liaison  [phone] business mobile phone Toll free office [phone]  Fax number: [phone] [email or web address]

## 2022-06-14 NOTE — Evaluation (Signed)
Occupational Therapy Evaluation Patient Details Name: [name] MRN: [id number] DOB: November 01, 1931 Today's Date: 06/14/2022   History of Present Illness Pt is an 88 year old woman admitted on 06/13/22 with acute on chronic hypoxia and hypercapnea secondary to dCHF exacerbation. Pt requiring bipap upon admission. Admitted last month with similar presentation PMH: CKD, COPD on 3L 02, CHF, pericardial effusion, HTN, anasarca, DM, morbid obesity.   Clinical Impression   Pt lives with her supportive son and has been performing transfers only (without RW). Prior to hospitalization last month, pt walk into her small bathroom for showering and toileting holding the sink, but since has been using a BSC. Pt's son helps with ADLs and all IADLs. Pt presents with generalized weakness, poor sitting and standing balance and decreased activity tolerance. Sp02 noted to drop into low 80s on 6L, but rebounded to 93% with cues for pursed lip breathing. Pt requires min assist for bed mobility and +2 min assist to transfer bed to chair. She needs set up to total assist for ADLs. Will follow acutely. Recommending HHOT.      Recommendations for follow up therapy are one component of a multi-disciplinary discharge planning process, led by the attending physician.  Recommendations may be updated based on patient status, additional functional criteria and insurance authorization.   Follow Up Recommendations  Home health OT    Assistance Recommended at Discharge Frequent or constant Supervision/Assistance  Patient can return home with the following A little help with walking and/or transfers;A lot of help with bathing/dressing/bathroom;Assistance with cooking/housework;Assist for transportation;Help with stairs or ramp for entrance    Functional Status Assessment  Patient has had a recent decline in their functional status and demonstrates the ability to make significant improvements in function in a reasonable and  predictable amount of time.  Equipment Recommendations  None recommended by OT    Recommendations for Other Services       Precautions / Restrictions Precautions Precautions: Fall Precaution Comments: pt on chronic 3L 02 Restrictions Weight Bearing Restrictions: No      Mobility Bed Mobility Overal bed mobility: Needs Assistance Bed Mobility: Supine to Sit     Supine to sit: Min assist, HOB elevated     General bed mobility comments: assist for trunk    Transfers Overall transfer level: Needs assistance   Transfers: Sit to/from Stand, Bed to chair/wheelchair/BSC Sit to Stand: +2 physical assistance, Min assist     Step pivot transfers: +2 physical assistance, Min assist     General transfer comment: pt preferring to step pivot without use of walker, assist to rise and steady      Balance Overall balance assessment: Needs assistance   Sitting balance-Leahy Scale: Poor Sitting balance - Comments: posterior and L side lean     Standing balance-Leahy Scale: Poor                             ADL either performed or assessed with clinical judgement   ADL Overall ADL's : Needs assistance/impaired Eating/Feeding: Independent;Sitting   Grooming: Set up;Sitting   Upper Body Bathing: Minimal assistance;Sitting   Lower Body Bathing: Maximal assistance;Sit to/from stand   Upper Body Dressing : Minimal assistance;Sitting   Lower Body Dressing: Total assistance;Bed level   Toilet Transfer: Minimal assistance;+2 for physical assistance;BSC/3in1   Toileting- Clothing Manipulation and Hygiene: Total assistance;Bed level         General ADL Comments: Pt cued for deep breaths.  Vision Baseline Vision/History: 1 Wears glasses Ability to See in Adequate Light: 0 Adequate Patient Visual Report: No change from baseline       Perception     Praxis      Pertinent Vitals/Pain Pain Assessment Pain Assessment: Faces Faces Pain Scale: Hurts a  little bit Pain Location: L LE Pain Descriptors / Indicators: Discomfort Pain Intervention(s): Monitored during session, Repositioned     Hand Dominance Right   Extremity/Trunk Assessment Upper Extremity Assessment Upper Extremity Assessment: Overall WFL for tasks assessed   Lower Extremity Assessment Lower Extremity Assessment: Defer to PT evaluation   Cervical / Trunk Assessment Cervical / Trunk Assessment: Other exceptions (obesity)   Communication Communication Communication: No difficulties   Cognition Arousal/Alertness: Awake/alert Behavior During Therapy: WFL for tasks assessed/performed, Flat affect Overall Cognitive Status: Within Functional Limits for tasks assessed                                       General Comments       Exercises     Shoulder Instructions      Home Living Family/patient expects to be discharged to:: Private residence Living Arrangements: Children (son) Available Help at Discharge: Available 24 hours/day;Family Type of Home: House Home Access: Ramped entrance     Home Layout: Two level;Able to live on main level with bedroom/bathroom     Bathroom Shower/Tub: Hospital doctor Toilet: Handicapped height     Home Equipment: Wheelchair - manual;Grab bars - tub/shower;Rolling Environmental consultant (2 wheels);Hand held shower head;Adaptive equipment Adaptive Equipment: Reacher;Long-handled sponge;Other (Comment) (dressing stick)        Prior Functioning/Environment Prior Level of Function : Needs assist             Mobility Comments: transfers independently to [facility] and w/c, does not typically use walker ADLs Comments: assisted for LB ADLs and all IADLs, pt was able to walk into her narrow bathroom prior to last admission, but now uses a BSC and sponge bathes        OT Problem List: Decreased strength;Decreased activity tolerance;Impaired balance (sitting and/or standing);Decreased cognition;Decreased safety  awareness;Decreased knowledge of use of DME or AE;Obesity;Cardiopulmonary status limiting activity      OT Treatment/Interventions: Self-care/ADL training;DME and/or AE instruction;Energy conservation;Therapeutic activities;Patient/family education;Balance training    OT Goals(Current goals can be found in the care plan section) Acute Rehab OT Goals OT Goal Formulation: With patient Time For Goal Achievement: 06/28/22 Potential to Achieve Goals: Good ADL Goals Pt Will Transfer to Toilet: with supervision;stand pivot transfer;bedside commode Pt Will Perform Toileting - Clothing Manipulation and hygiene: with supervision;sit to/from stand Additional ADL Goal #1: Pt will monitor need for rest breaks and pursed lip breathing strategies during exertion.  OT Frequency: Min 2X/week    Co-evaluation PT/OT/SLP Co-Evaluation/Treatment: Yes Reason for Co-Treatment: For patient/therapist safety   OT goals addressed during session: ADL's and self-care      AM-PAC OT "6 Clicks" Daily Activity     Outcome Measure Help from another person eating meals?: None Help from another person taking care of personal grooming?: A Little Help from another person toileting, which includes using toliet, bedpan, or urinal?: A Lot Help from another person bathing (including washing, rinsing, drying)?: A Lot Help from another person to put on and taking off regular upper body clothing?: A Little Help from another person to put on and taking off regular lower body clothing?:  Total 6 Click Score: 15   End of Session Equipment Utilized During Treatment: Gait belt;Rolling walker (2 wheels);Oxygen (6L) Nurse Communication: Mobility status  Activity Tolerance: Patient tolerated treatment well Patient left: in chair;with call bell/phone within reach;with chair alarm set  OT Visit Diagnosis: Unsteadiness on feet (R26.81);Other abnormalities of gait and mobility (R26.89);Other (comment) (decreased activity tolerance)                 Time: 8063-8685 OT Time Calculation (min): 27 min Charges:  OT General Charges $OT Visit: 1 Visit OT Evaluation $OT Eval Moderate Complexity: [place], OTR/L Acute Rehabilitation Services Office: [phone]   [name] 06/14/2022, 1:15 PM

## 2022-06-14 NOTE — Plan of Care (Signed)

## 2022-06-15 DIAGNOSIS — I10 Essential (primary) hypertension: Secondary | ICD-10-CM | POA: Diagnosis not present

## 2022-06-15 DIAGNOSIS — N289 Disorder of kidney and ureter, unspecified: Secondary | ICD-10-CM | POA: Diagnosis not present

## 2022-06-15 DIAGNOSIS — J9622 Acute and chronic respiratory failure with hypercapnia: Secondary | ICD-10-CM | POA: Diagnosis not present

## 2022-06-15 DIAGNOSIS — J9621 Acute and chronic respiratory failure with hypoxia: Secondary | ICD-10-CM | POA: Diagnosis not present

## 2022-06-15 DIAGNOSIS — I509 Heart failure, unspecified: Secondary | ICD-10-CM | POA: Diagnosis not present

## 2022-06-15 LAB — BASIC METABOLIC PANEL
Anion gap: 8 (ref 5–15)
Anion gap: 9 (ref 5–15)
BUN: 43 mg/dL — ABNORMAL HIGH (ref 8–23)
BUN: 37 mg/dL — ABNORMAL HIGH (ref 8–23)
CO2: 34 mmol/L — ABNORMAL HIGH (ref 22–32)
CO2: 34 mmol/L — ABNORMAL HIGH (ref 22–32)
Calcium: 8.9 mg/dL (ref 8.9–10.3)
Calcium: 8.9 mg/dL (ref 8.9–10.3)
Chloride: 98 mmol/L (ref 98–111)
Chloride: 97 mmol/L — ABNORMAL LOW (ref 98–111)
Creatinine, Ser: 1.46 mg/dL — ABNORMAL HIGH (ref 0.44–1.00)
Creatinine, Ser: 1.37 mg/dL — ABNORMAL HIGH (ref 0.44–1.00)
GFR, Estimated: 34 mL/min — ABNORMAL LOW (ref >60)
GFR, Estimated: 37 mL/min — ABNORMAL LOW (ref >60)
Glucose, Bld: 114 mg/dL — ABNORMAL HIGH (ref 70–99)
Glucose, Bld: 163 mg/dL — ABNORMAL HIGH (ref 70–99)
Potassium: 4.5 mmol/L (ref 3.5–5.1)
Potassium: 4.4 mmol/L (ref 3.5–5.1)
Sodium: 140 mmol/L (ref 135–145)
Sodium: 140 mmol/L (ref 135–145)

## 2022-06-15 LAB — CBC
HCT: 30.5 % — ABNORMAL LOW (ref 36.0–46.0)
Hemoglobin: 9.4 g/dL — ABNORMAL LOW (ref 12.0–15.0)
MCH: 27.7 pg (ref 26.0–34.0)
MCHC: 30.8 g/dL (ref 30.0–36.0)
MCV: 90 fL (ref 80.0–100.0)
Platelets: 211 10*3/uL (ref 150–400)
RBC: 3.39 MIL/uL — ABNORMAL LOW (ref 3.87–5.11)
RDW: 15.9 % — ABNORMAL HIGH (ref 11.5–15.5)
WBC: 8.8 10*3/uL (ref 4.0–10.5)
nRBC: 0 % (ref 0.0–0.2)

## 2022-06-15 LAB — GLUCOSE, CAPILLARY
Glucose-Capillary: 106 mg/dL — ABNORMAL HIGH (ref 70–99)
Glucose-Capillary: 182 mg/dL — ABNORMAL HIGH (ref 70–99)
Glucose-Capillary: 182 mg/dL — ABNORMAL HIGH (ref 70–99)
Glucose-Capillary: 220 mg/dL — ABNORMAL HIGH (ref 70–99)

## 2022-06-15 LAB — MAGNESIUM
Magnesium: 2.1 mg/dL (ref 1.7–2.4)
Magnesium: 2 mg/dL (ref 1.7–2.4)

## 2022-06-15 MED ORDER — ALPRAZOLAM 0.25 MG PO TABS
0.1250 mg | ORAL_TABLET | Freq: Two times a day (BID) | ORAL | Status: DC | PRN
  Administered 2022-06-15 – 2022-06-17 (×3): 0.125 mg via ORAL
  Filled 2022-06-15 (×3): qty 1

## 2022-06-15 MED ORDER — SPIRONOLACTONE 25 MG PO TABS
25.0000 mg | ORAL_TABLET | Freq: Every day | ORAL | Status: DC
  Administered 2022-06-15: 25 mg via ORAL
  Filled 2022-06-15: qty 1

## 2022-06-15 NOTE — Progress Notes (Signed)
PROGRESS NOTE    [name]  YJE:[id number] DOB: 1931/02/16 DOA: 06/13/2022 PCP: Hoyt Koch, MD   Brief Narrative:  88 year old with history of HTN, HLD, CAD, diastolic CHF, COPD on 2-3 L nasal cannula, DM2, morbid obesity presented with shortness of breath.  Initially patient was found to be lethargic.  She was recently admitted for hypoxic respiratory failure requiring BiPAP which was thought to be due to fluid overload.   Assessment & Plan:   Acute on chronic respiratory failure with hypoxia and hypercapnia secondary to acute on chronic diastolic congestive heart failure, EF 65%, grade 2 DD  - Hypoxia secondary to fluid overload, being diuresed with IV Lasix and Aldactone added, placed on fluid restriction and TED stockings, seen by cardiology, with diuresis much improved.  Initially required BiPAP now back to nasal cannula.  Advance activity and continue to monitor.  Acute metabolic encephalopathy - Secondary to CO2 narcosis.  Resolved.  COPD, without exacerbation - Not an active exacerbation, continue home bronchodilators   Renal insufficiency superimposed chronic kidney disease stage IIIb - Around baseline of 1.5.  Continue to monitor   Controlled diabetes mellitus type 2, with hyperglycemia - Last A1c 6.9 05/15/2022.  Semglee 10 units at bedtime, sliding scale.  Adjust as needed   Essential hypertension - Continue Lopressor 25 mg twice daily, hydralazine 25 mg twice daily.  Also getting Lasix.  Will place IV hydralazine orders   Hyponatremia  Acute.  Due to CHF resolved after diuresis.   Normocytic anemia - Hemoglobin at baseline of 9.5  Hyperlipidemia  -- Lipitor    Suspected sleep apnea  - Needs outpatient sleep study  Goals of care discussion.  Seen by palliative care.  Patient is DNR     DVT prophylaxis: Place TED hose Start: 06/15/22 1058 enoxaparin (LOVENOX) injection 30 mg Start: 06/13/22 1000 Code Status: DNR Family Communication:  None  present  Status is: Inpatient Remains inpatient appropriate because: Working on weaning her off BiPAP thereafter nasal cannula.  In the meantime aggressive IV diuretics.  Subjective:  Patient in bed, appears comfortable, denies any headache, no fever, no chest pain or pressure, improving shortness of breath , no abdominal pain. No new focal weakness.  Examination:  Awake Alert, No new F.N deficits, Normal affect [place].AT,PERRAL Supple Neck, No JVD,   Symmetrical Chest wall movement, Good air movement bilaterally, CTAB RRR,No Gallops, Rubs or new Murmurs,  +ve B.Sounds, Abd Soft, No tenderness,   No Cyanosis, Clubbing or edema   Objective:  Vitals:   06/15/22 0733 06/15/22 0800 06/15/22 0900 06/15/22 1000  BP:  (!) 120/49    Pulse: 88 97  83  Resp: '15 20 18 '$ (!) 23  Temp:      TempSrc:      SpO2: 94%  94% 94%  Weight:      Height:        Intake/Output Summary (Last 24 hours) at 06/15/2022 1104 Last data filed at 06/15/2022 0957 Gross per 24 hour  Intake 240 ml  Output 3350 ml  Net -3110 ml   Filed Weights   06/13/22 1830 06/14/22 0433  Weight: 98.6 kg 98.6 kg     Data Reviewed:   Recent Labs  Lab 06/13/22 0727 06/13/22 0738 06/13/22 1103 06/14/22 0346 06/15/22 0315  WBC 11.8*  --   --  8.0 8.8  HGB 9.8* 11.2* 10.5* 9.4* 9.4*  HCT 32.4* 33.0* 31.0* 31.4* 30.5*  PLT 233  --   --  200 211  MCV 91.5  --   --  91.5 90.0  MCH 27.7  --   --  27.4 27.7  MCHC 30.2  --   --  29.9* 30.8  RDW 15.9*  --   --  15.9* 15.9*  LYMPHSABS 0.9  --   --   --   --   MONOABS 0.6  --   --   --   --   EOSABS 0.2  --   --   --   --   BASOSABS 0.0  --   --   --   --     Recent Labs  Lab 06/13/22 0727 06/13/22 0738 06/13/22 0930 06/13/22 1103 06/14/22 0346 06/15/22 0315  NA 134* 133*  --  132* 136 140  K 5.2* 5.2*  --  5.3* 4.9 4.5  CL 96*  --   --   --  99 98  CO2 29  --   --   --  28 34*  GLUCOSE 189*  --   --   --  83 114*  BUN 42*  --   --   --  47* 43*  CREATININE  1.75*  --   --   --  1.52* 1.46*  CALCIUM 8.7*  --   --   --  8.6* 8.9  AST 17  --   --   --   --   --   ALT 18  --   --   --   --   --   ALKPHOS 69  --   --   --   --   --   BILITOT 0.7  --   --   --   --   --   ALBUMIN 2.9*  --   --   --   --   --   MG 2.2  --  2.4  --   --  2.1  BNP 732.5*  --   --   --   --   --            Scheduled Meds:  acidophilus  1 capsule Oral Daily   aspirin EC  81 mg Oral q morning   atorvastatin  40 mg Oral Daily   diclofenac Sodium  1 Application Topical QHS   enoxaparin (LOVENOX) injection  30 mg Subcutaneous Q24H   fluticasone furoate-vilanterol  1 puff Inhalation QPM   furosemide  60 mg Intravenous BID   gabapentin  100 mg Oral BID   hydrALAZINE  25 mg Oral BID   insulin aspart  0-15 Units Subcutaneous TID WC   insulin glargine-yfgn  10 Units Subcutaneous QHS   metoprolol tartrate  25 mg Oral BID   sodium chloride flush  3 mL Intravenous Q12H   spironolactone  25 mg Oral Daily   Continuous Infusions:   LOS: 2 days   Time spent= 35 mins   Lala Lund, MD [facility]  If 7PM-7AM, please contact night-coverage  06/15/2022, 11:04 AM

## 2022-06-15 NOTE — Progress Notes (Signed)
Daily Progress Note   Patient Name: [name]       Date: 06/15/2022 DOB: 1931/06/19  Age: 88 y.o. MRN#: [id number] Attending Physician: Thurnell Lose, MD Primary Care Physician: Hoyt Koch, MD Admit Date: 06/13/2022  Reason for Consultation/Follow-up: Establishing goals of care  Subjective: Medical records reviewed including progress notes, labs. Patient assessed at the bedside.  She reports feeling well today. On 6L nasal cannula in no respiratory distress.  No family present during my visit.  [name] has a good understanding of her current care plan.  She tells me she was able to wear some sort of mask last night from before midnight till around 3 AM.  She feels like she is breathing like normal again.  She thinks she will be going home on Monday.  I then called patient's son [name] to continue goals of care conversation and provide palliative support but was unable to reach.  Voicemail with contact information was provided.  Questions and concerns addressed. PMT will continue to support holistically.   PM Addendum: Received a message from patient's son [name] return to the bedside to discuss his concerns regarding patient's episode of delirium this afternoon around 2 PM.  She was also hallucinating and she tells me "I had a meltdown and thought I was in the lobby."  We discussed this is likely hospital delirium and education/reassurance was provided.  Patient also shares with me she has reflected further on the option of SNF placement one day if needed and feels like she would actually consider this.  She shares that her mother-in-law had a good experience while she was caring for her and visiting at a SNF.   Length of Stay: 2   Physical Exam Vitals and nursing note  reviewed.  Constitutional:      General: She is not in acute distress.    Interventions: Nasal cannula in place.     Comments: 6L  Pulmonary:     Effort: No respiratory distress.  Neurological:     Mental Status: She is alert.  Psychiatric:        Mood and Affect: Mood normal.        Speech: Speech normal.        Behavior: Behavior is cooperative.        Thought Content: Thought content  normal.             Vital Signs: BP (!) 135/52 (BP Location: Left Arm)   Pulse 70   Temp 98.9 F (37.2 C) (Oral)   Resp (!) 21   Ht '5\' 4"'$  (1.626 m)   Wt 98.6 kg   SpO2 96%   BMI 37.31 kg/m  SpO2: SpO2: 96 % O2 Device: O2 Device: Nasal Cannula O2 Flow Rate: O2 Flow Rate (L/min): 5 L/min      Palliative Assessment/Data: 50%    Palliative Care Assessment & Plan   Patient Profile: 88 y.o. female  with past medical history of hypertension, hyperlipidemia, CAD, diastolic CHF, COPD on 2-3 L of oxygen, DM type II, and morbid obesity admitted on 06/13/2022 with dyspnea.    Patient had just been recently hospitalized from 7/11-7/18 for acute on chronic respiratory failure with hypoxia and hypercapnia requiring placement on BiPAP. Now in the ED for the same. PMT has been consulted to assist with goals of care conversation.  Assessment: Goals of care conversation Acute on chronic respiratory failure with hypoxia and hypercapnia, improving Acute on chronic diastolic CHF, improving Acute metabolic encephalopathy, improving Acute on chronic renal insufficiency  Recommendations/Plan: Continue DNR Continue current care Patient remains hopeful for improvement and agreeable to outpatient palliative care follow-up at discharge Psychosocial emotional support provided PMT will continue to follow and support  Addendum to Recommendations/Plan:  HCPOA/Living Will documentation received from son [name]. Will scan copy into [facility]. Delirium precautions: --Get up during the day with frequent mobilization  when possible --Keep blinds open and lights on during daylight hours with dim lighting in evening hours --Minimize the use of opioids/benzodiazepines --Reorient the patient frequently and provide reassurance, provide easily visible clock and calendar --Provide sensory aids like glasses, hearing aids --Encourage ambulation, regular activities and visitors to maintain cognitive stimulation   --Avoid physical restraints including mitts -- Avoid p.m. interruptions while patient is sleeping including labs -- Minimal devices if patient is pulling on lines such as indwelling urinary catheter -- Low music or white noise as desired, avoid TV unless requested by patient or family -- Keep voice is quiet in room and immediately outside of room  -- Explained interventions   Prognosis:  Unable to determine  Discharge Planning: Home with Palliative Services/home health  Care plan was discussed with patient, patient's son, RN  Total time: I spent 60 minutes in the care of the patient today in the above activities and documenting the encounter.          [name] Johnnette Litter, PA-C  Palliative Medicine Team Team phone # [phone]  Thank you for allowing the Palliative Medicine Team to assist in the care of this patient. Please utilize secure chat with additional questions, if there is no response within 30 minutes please call the above phone number.  Palliative Medicine Team providers are available by phone from 7am to 7pm daily and can be reached through the team cell phone.  Should this patient require assistance outside of these hours, please call the patient's attending physician.
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
## 2022-06-15 NOTE — Progress Notes (Signed)
Patient removed bipap and stated she did not want to wear it anymore. Removed bipap and placed patient on 6lpm nasal cannula.

## 2022-06-16 DIAGNOSIS — J9622 Acute and chronic respiratory failure with hypercapnia: Secondary | ICD-10-CM | POA: Diagnosis not present

## 2022-06-16 DIAGNOSIS — J9621 Acute and chronic respiratory failure with hypoxia: Secondary | ICD-10-CM | POA: Diagnosis not present

## 2022-06-16 LAB — CBC WITH DIFFERENTIAL/PLATELET
Abs Immature Granulocytes: 0.06 10*3/uL (ref 0.00–0.07)
Basophils Absolute: 0 10*3/uL (ref 0.0–0.1)
Basophils Relative: 0 %
Eosinophils Absolute: 0.4 10*3/uL (ref 0.0–0.5)
Eosinophils Relative: 4 %
HCT: 32 % — ABNORMAL LOW (ref 36.0–46.0)
Hemoglobin: 9.7 g/dL — ABNORMAL LOW (ref 12.0–15.0)
Immature Granulocytes: 1 %
Lymphocytes Relative: 12 %
Lymphs Abs: 1.1 10*3/uL (ref 0.7–4.0)
MCH: 27.2 pg (ref 26.0–34.0)
MCHC: 30.3 g/dL (ref 30.0–36.0)
MCV: 89.9 fL (ref 80.0–100.0)
Monocytes Absolute: 0.8 10*3/uL (ref 0.1–1.0)
Monocytes Relative: 8 %
Neutro Abs: 6.6 10*3/uL (ref 1.7–7.7)
Neutrophils Relative %: 75 %
Platelets: 208 10*3/uL (ref 150–400)
RBC: 3.56 MIL/uL — ABNORMAL LOW (ref 3.87–5.11)
RDW: 15.8 % — ABNORMAL HIGH (ref 11.5–15.5)
WBC: 9 10*3/uL (ref 4.0–10.5)
nRBC: 0 % (ref 0.0–0.2)

## 2022-06-16 LAB — BASIC METABOLIC PANEL
Anion gap: 8 (ref 5–15)
BUN: 35 mg/dL — ABNORMAL HIGH (ref 8–23)
CO2: 36 mmol/L — ABNORMAL HIGH (ref 22–32)
Calcium: 9 mg/dL (ref 8.9–10.3)
Chloride: 95 mmol/L — ABNORMAL LOW (ref 98–111)
Creatinine, Ser: 1.32 mg/dL — ABNORMAL HIGH (ref 0.44–1.00)
GFR, Estimated: 39 mL/min — ABNORMAL LOW (ref >60)
Glucose, Bld: 162 mg/dL — ABNORMAL HIGH (ref 70–99)
Potassium: 4.3 mmol/L (ref 3.5–5.1)
Sodium: 139 mmol/L (ref 135–145)

## 2022-06-16 LAB — GLUCOSE, CAPILLARY
Glucose-Capillary: 141 mg/dL — ABNORMAL HIGH (ref 70–99)
Glucose-Capillary: 213 mg/dL — ABNORMAL HIGH (ref 70–99)
Glucose-Capillary: 188 mg/dL — ABNORMAL HIGH (ref 70–99)
Glucose-Capillary: 256 mg/dL — ABNORMAL HIGH (ref 70–99)

## 2022-06-16 LAB — MAGNESIUM: Magnesium: 2 mg/dL (ref 1.7–2.4)

## 2022-06-16 MED ORDER — ACETAZOLAMIDE 250 MG PO TABS
500.0000 mg | ORAL_TABLET | Freq: Two times a day (BID) | ORAL | Status: AC
Start: 2022-06-16 — End: 2022-06-16
  Administered 2022-06-16 (×2): 500 mg via ORAL
  Filled 2022-06-16 (×2): qty 2

## 2022-06-16 NOTE — Progress Notes (Signed)
Patient wearing oxygen at this time set at 5lpm with Sp02=97%. Patient does not appear to be in any respiratory distress. Bipap in room on standby if needed.

## 2022-06-16 NOTE — Plan of Care (Signed)
  Problem: Education: Goal: Ability to verbalize understanding of medication therapies will improve Outcome: Progressing   Problem: Cardiac: Goal: Ability to achieve and maintain adequate cardiopulmonary perfusion will improve Outcome: Progressing   Problem: Skin Integrity: Goal: Risk for impaired skin integrity will decrease Outcome: Progressing

## 2022-06-16 NOTE — Progress Notes (Signed)
Daily Progress Note   Patient Name: [name]       Date: 06/16/2022 DOB: 02-20-1931  Age: 88 y.o. MRN#: [id number] Attending Physician: Thurnell Lose, MD Primary Care Physician: Hoyt Koch, MD Admit Date: 06/13/2022  Reason for Consultation/Follow-up: Establishing goals of care  Subjective: Medical records reviewed including progress notes, labs. Patient assessed at the bedside.  She reports feeling well today. On 6L nasal cannula in no respiratory distress.  Her granddaughter and daughter-in-law are present visiting.  We discussed yesterday's confusion and she reports it has not been that bad since then, although she still gets confused from time to time.  Answered family's questions about possible causes and interventions.  All are in understanding of the plan to continue weaning oxygen as tolerated.  Called patient's son [name] to provide ongoing palliative support.  I was unable to reach and left PMT contact information on voicemail message.  Encouraged him to call should needs arise.  Questions and concerns addressed.  PMT will continue to follow and support holistically.  Length of Stay: 3   Physical Exam Vitals and nursing note reviewed.  Constitutional:      General: She is not in acute distress.    Interventions: Nasal cannula in place.     Comments: 5L  Pulmonary:     Effort: No respiratory distress.  Neurological:     Mental Status: She is alert.  Psychiatric:        Mood and Affect: Mood normal.        Speech: Speech normal.        Behavior: Behavior is cooperative.        Thought Content: Thought content normal.             Vital Signs: BP (!) 146/48 (BP Location: Right Arm)   Pulse 73   Temp 98 F (36.7 C) (Oral)   Resp 19   Ht '5\' 4"'$  (1.626  m)   Wt 93.3 kg   SpO2 92%   BMI 35.31 kg/m  SpO2: SpO2: 92 % O2 Device: O2 Device: Nasal Cannula O2 Flow Rate: O2 Flow Rate (L/min): 5 L/min      Palliative Assessment/Data: 50%    Palliative Care Assessment & Plan   Patient Profile: 88 y.o. female  with past medical history of hypertension,  hyperlipidemia, CAD, diastolic CHF, COPD on 2-3 L of oxygen, DM type II, and morbid obesity admitted on 06/13/2022 with dyspnea.    Patient had just been recently hospitalized from 7/11-7/18 for acute on chronic respiratory failure with hypoxia and hypercapnia requiring placement on BiPAP. Now in the ED for the same. PMT has been consulted to assist with goals of care conversation.  Assessment: Goals of care conversation Acute on chronic respiratory failure with hypoxia and hypercapnia, improving Acute on chronic diastolic CHF, improving Acute metabolic encephalopathy, improving Acute on chronic renal insufficiency  Recommendations/Plan: Continue DNR Continue current care Continue delirium precautions Patient remains hopeful for improvement and agreeable to outpatient palliative care follow-up at discharge Psychosocial and emotional support provided PMT will continue to follow peripherally.  Please secure chat or call team line should urgent needs arise  Prognosis:  Unable to determine  Discharge Planning: Home with Palliative Services/home health  Care plan was discussed with patient, granddaughter, DIL  Total time: I spent 40 minutes in the care of the patient today in the above activities and documenting the encounter.   Dorthy Cooler, PA-C Palliative Medicine Team Team phone # [phone]  Thank you for allowing the Palliative Medicine Team to assist in the care of this patient. Please utilize secure chat with additional questions, if there is no response within 30 minutes please call the above phone number.  Palliative Medicine Team providers are available by phone  from 7am to 7pm daily and can be reached through the team cell phone.  Should this patient require assistance outside of these hours, please call the patient's attending physician.

## 2022-06-16 NOTE — Progress Notes (Signed)
PROGRESS NOTE    [name]  GUR:[id number] DOB: 1930/11/29 DOA: 06/13/2022 PCP: Hoyt Koch, MD   Brief Narrative:  88 year old with history of HTN, HLD, CAD, diastolic CHF, COPD on 2-3 L nasal cannula, DM2, morbid obesity presented with shortness of breath.  Initially patient was found to be lethargic.  She was recently admitted for hypoxic respiratory failure requiring BiPAP which was thought to be due to fluid overload.   Assessment & Plan:   Acute on chronic respiratory failure with hypoxia and hypercapnia secondary to acute on chronic diastolic congestive heart failure, EF 65%, grade 2 DD  - Hypoxia secondary to fluid overload, has been adequately diuresed with Lasix and Aldactone, placed on fluid restriction and TED stockings, seen by cardiology, with diuresis clinically she is much improved.  Initially required BiPAP now back to nasal cannula, at baseline currently using between 4 to 5 L nasal cannula oxygen.  Continue to advance activity titrate down oxygen, since her CO2 is rising she might be getting slightly intravascularly depleted, hold further diuretics on 06/16/2022, challenge with 2 doses of Diamox on 06/16/2022, case discussed with cardiology.  Monitor.  Acute metabolic encephalopathy -likely some element of age-related of cognitive decline at baseline, per son she does get confused once in a while at home, some element of hospital-acquired delirium or metabolic encephalopathy in the hospital.  No focal deficits or headache.  Continue to monitor.  Initially might have had some CO2 narcosis but now stable.  Minimize benzodiazepines, she does take some Xanax at home on a as needed basis will use as less frequently as possible.  COPD, without exacerbation - Not an active exacerbation, continue home bronchodilators   Renal insufficiency superimposed chronic kidney disease stage IIIb - Around baseline of 1.5.  Continue to monitor   Controlled diabetes mellitus type 2, with  hyperglycemia - Last A1c 6.9 05/15/2022.  Semglee 10 units at bedtime, sliding scale.  Adjust as needed   Essential hypertension - Continue Lopressor 25 mg twice daily, hydralazine 25 mg twice daily.  With diuretics and as needed IV hydralazine orders   Hyponatremia  Acute.  Due to CHF resolved after diuresis.   Normocytic anemia - Hemoglobin at baseline of 9.5  Hyperlipidemia  -- Lipitor    Suspected sleep apnea  - Needs outpatient sleep study  Goals of care discussion.  Seen by palliative care.  Patient is DNR     DVT prophylaxis: Place TED hose Start: 06/15/22 1058 enoxaparin (LOVENOX) injection 30 mg Start: 06/13/22 1000 Code Status: DNR Family Communication:  updated son bedside on 06/16/2022  Status is: Inpatient Remains inpatient appropriate because: Working on weaning her off BiPAP thereafter nasal cannula.  In the meantime aggressive IV diuretics.  Subjective: Patient in bed, appears comfortable, denies any headache, no fever, no chest pain or pressure, no shortness of breath , no abdominal pain. No focal weakness.  Examination:  Awake Alert, oriented x3, no new F.N deficits, Normal affect [place].AT,PERRAL Supple Neck, No JVD,   Symmetrical Chest wall movement, Good air movement bilaterally, CTAB RRR,No Gallops, Rubs or new Murmurs,  +ve B.Sounds, Abd Soft, No tenderness,   No Cyanosis, trace edema, TED stockings in place    Objective:  Vitals:   06/16/22 0441 06/16/22 0753 06/16/22 0800 06/16/22 0841  BP:  133/62 136/64   Pulse:  70 70 75  Resp:  19 18 (!) 21  Temp:  98.1 F (36.7 C)    TempSrc:  Oral    SpO2:  94% 93% 94%  Weight: 93.3 kg     Height:        Intake/Output Summary (Last 24 hours) at 06/16/2022 0939 Last data filed at 06/16/2022 0800 Gross per 24 hour  Intake 840 ml  Output 4475 ml  Net -3635 ml   Filed Weights   06/13/22 1830 06/14/22 0433 06/16/22 0441  Weight: 98.6 kg 98.6 kg 93.3 kg     Data Reviewed:   Recent Labs  Lab  06/13/22 0727 06/13/22 0738 06/13/22 1103 06/14/22 0346 06/15/22 0315 06/16/22 0323  WBC 11.8*  --   --  8.0 8.8 9.0  HGB 9.8* 11.2* 10.5* 9.4* 9.4* 9.7*  HCT 32.4* 33.0* 31.0* 31.4* 30.5* 32.0*  PLT 233  --   --  200 211 208  MCV 91.5  --   --  91.5 90.0 89.9  MCH 27.7  --   --  27.4 27.7 27.2  MCHC 30.2  --   --  29.9* 30.8 30.3  RDW 15.9*  --   --  15.9* 15.9* 15.8*  LYMPHSABS 0.9  --   --   --   --  1.1  MONOABS 0.6  --   --   --   --  0.8  EOSABS 0.2  --   --   --   --  0.4  BASOSABS 0.0  --   --   --   --  0.0    Recent Labs  Lab 06/13/22 0727 06/13/22 0738 06/13/22 0930 06/13/22 1103 06/14/22 0346 06/15/22 0315 06/15/22 1715 06/16/22 0323  NA 134*   < >  --  132* 136 140 140 139  K 5.2*   < >  --  5.3* 4.9 4.5 4.4 4.3  CL 96*  --   --   --  99 98 97* 95*  CO2 29  --   --   --  28 34* 34* 36*  GLUCOSE 189*  --   --   --  83 114* 163* 162*  BUN 42*  --   --   --  47* 43* 37* 35*  CREATININE 1.75*  --   --   --  1.52* 1.46* 1.37* 1.32*  CALCIUM 8.7*  --   --   --  8.6* 8.9 8.9 9.0  AST 17  --   --   --   --   --   --   --   ALT 18  --   --   --   --   --   --   --   ALKPHOS 69  --   --   --   --   --   --   --   BILITOT 0.7  --   --   --   --   --   --   --   ALBUMIN 2.9*  --   --   --   --   --   --   --   MG 2.2  --  2.4  --   --  2.1 2.0 2.0  BNP 732.5*  --   --   --   --   --   --   --    < > = values in this interval not displayed.    Scheduled Meds:  acetaZOLAMIDE  500 mg Oral BID   acidophilus  1 capsule Oral Daily   aspirin EC  81 mg Oral q morning   atorvastatin  40 mg Oral Daily   diclofenac Sodium  1 Application Topical QHS   enoxaparin (LOVENOX) injection  30 mg Subcutaneous Q24H   fluticasone furoate-vilanterol  1 puff Inhalation QPM   gabapentin  100 mg Oral BID   hydrALAZINE  25 mg Oral BID   insulin aspart  0-15 Units Subcutaneous TID WC   insulin glargine-yfgn  10 Units Subcutaneous QHS   metoprolol tartrate  25 mg Oral BID   sodium  chloride flush  3 mL Intravenous Q12H   Continuous Infusions:   LOS: 3 days   Time spent= 35 mins   Lala Lund, MD [facility]  If 7PM-7AM, please contact night-coverage  06/16/2022, 9:39 AM

## 2022-06-17 DIAGNOSIS — J9621 Acute and chronic respiratory failure with hypoxia: Secondary | ICD-10-CM | POA: Diagnosis not present

## 2022-06-17 DIAGNOSIS — J9622 Acute and chronic respiratory failure with hypercapnia: Secondary | ICD-10-CM | POA: Diagnosis not present

## 2022-06-17 LAB — BASIC METABOLIC PANEL
Anion gap: 8 (ref 5–15)
BUN: 37 mg/dL — ABNORMAL HIGH (ref 8–23)
CO2: 35 mmol/L — ABNORMAL HIGH (ref 22–32)
Calcium: 9 mg/dL (ref 8.9–10.3)
Chloride: 94 mmol/L — ABNORMAL LOW (ref 98–111)
Creatinine, Ser: 1.5 mg/dL — ABNORMAL HIGH (ref 0.44–1.00)
GFR, Estimated: 33 mL/min — ABNORMAL LOW (ref >60)
Glucose, Bld: 181 mg/dL — ABNORMAL HIGH (ref 70–99)
Potassium: 4 mmol/L (ref 3.5–5.1)
Sodium: 137 mmol/L (ref 135–145)

## 2022-06-17 LAB — CBC WITH DIFFERENTIAL/PLATELET
Abs Immature Granulocytes: 0.03 10*3/uL (ref 0.00–0.07)
Basophils Absolute: 0 10*3/uL (ref 0.0–0.1)
Basophils Relative: 1 %
Eosinophils Absolute: 0.6 10*3/uL — ABNORMAL HIGH (ref 0.0–0.5)
Eosinophils Relative: 8 %
HCT: 33.7 % — ABNORMAL LOW (ref 36.0–46.0)
Hemoglobin: 10 g/dL — ABNORMAL LOW (ref 12.0–15.0)
Immature Granulocytes: 0 %
Lymphocytes Relative: 16 %
Lymphs Abs: 1.2 10*3/uL (ref 0.7–4.0)
MCH: 27 pg (ref 26.0–34.0)
MCHC: 29.7 g/dL — ABNORMAL LOW (ref 30.0–36.0)
MCV: 91.1 fL (ref 80.0–100.0)
Monocytes Absolute: 0.8 10*3/uL (ref 0.1–1.0)
Monocytes Relative: 10 %
Neutro Abs: 4.9 10*3/uL (ref 1.7–7.7)
Neutrophils Relative %: 65 %
Platelets: 227 10*3/uL (ref 150–400)
RBC: 3.7 MIL/uL — ABNORMAL LOW (ref 3.87–5.11)
RDW: 15.8 % — ABNORMAL HIGH (ref 11.5–15.5)
WBC: 7.5 10*3/uL (ref 4.0–10.5)
nRBC: 0 % (ref 0.0–0.2)

## 2022-06-17 LAB — GLUCOSE, CAPILLARY
Glucose-Capillary: 183 mg/dL — ABNORMAL HIGH (ref 70–99)
Glucose-Capillary: 288 mg/dL — ABNORMAL HIGH (ref 70–99)
Glucose-Capillary: 197 mg/dL — ABNORMAL HIGH (ref 70–99)
Glucose-Capillary: 276 mg/dL — ABNORMAL HIGH (ref 70–99)

## 2022-06-17 LAB — MAGNESIUM: Magnesium: 2.2 mg/dL (ref 1.7–2.4)

## 2022-06-17 MED ORDER — FUROSEMIDE 40 MG PO TABS: 40.0000 mg | ORAL_TABLET | Freq: Two times a day (BID) | ORAL | Status: DC

## 2022-06-17 MED ORDER — SPIRONOLACTONE 25 MG PO TABS
25.0000 mg | ORAL_TABLET | Freq: Every day | ORAL | Status: DC
  Administered 2022-06-17 – 2022-06-18 (×2): 25 mg via ORAL
  Filled 2022-06-17 (×2): qty 1

## 2022-06-17 MED ORDER — ACETAZOLAMIDE 250 MG PO TABS
500.0000 mg | ORAL_TABLET | Freq: Two times a day (BID) | ORAL | Status: DC
Start: 2022-06-17 — End: 2022-06-17

## 2022-06-17 MED ORDER — FUROSEMIDE 40 MG PO TABS: 40.0000 mg | ORAL_TABLET | Freq: Once | ORAL | Status: DC

## 2022-06-17 MED ORDER — FUROSEMIDE 40 MG PO TABS
40.0000 mg | ORAL_TABLET | Freq: Two times a day (BID) | ORAL | Status: DC
  Administered 2022-06-17 – 2022-06-18 (×3): 40 mg via ORAL
  Filled 2022-06-17 (×3): qty 1

## 2022-06-17 NOTE — Progress Notes (Signed)
Occupational Therapy Treatment Patient Details Name: [name] MRN: [id number] DOB: 10/21/1931 Today's Date: 06/17/2022   History of present illness Pt is an 88 year old woman admitted on 06/13/22 with acute on chronic hypoxia and hypercapnea secondary to dCHF exacerbation. Pt requiring bipap upon admission. Admitted last month with similar presentation PMH: CKD, COPD on 3L 02, CHF, pericardial effusion, HTN, anasarca, DM, morbid obesity.   OT comments  Patient received in bed and eager to participate. Patient able to get to EOB with HOB raised, verbal cues, and min guard assist. Patient attempted to walk to bathroom but made it to door and stated seat was too low and was provided BSC. Patient ambulated back to EOB and after a short rest ambulated short distance to recliner. Patient demonstrated increased endurance while on 4 liters of O2 and required frequent cues for pursed lip breathing. Acute OT to continue to follow.    Recommendations for follow up therapy are one component of a multi-disciplinary discharge planning process, led by the attending physician.  Recommendations may be updated based on patient status, additional functional criteria and insurance authorization.    Follow Up Recommendations  Home health OT    Assistance Recommended at Discharge Frequent or constant Supervision/Assistance  Patient can return home with the following  A little help with walking and/or transfers;A lot of help with bathing/dressing/bathroom;Assistance with cooking/housework;Assist for transportation;Help with stairs or ramp for entrance   Equipment Recommendations  None recommended by OT    Recommendations for Other Services      Precautions / Restrictions Precautions Precautions: Fall Precaution Comments: pt on chronic 3LO2 - on 4LO2 presently Restrictions Weight Bearing Restrictions: No       Mobility Bed Mobility Overal bed mobility: Needs Assistance Bed Mobility: Supine to Sit      Supine to sit: Min guard, HOB elevated     General bed mobility comments: assist for trunk    Transfers Overall transfer level: Needs assistance Equipment used: Rolling walker (2 wheels) Transfers: Sit to/from Stand, Bed to chair/wheelchair/BSC Sit to Stand: Min assist           General transfer comment: verbal cues for hand placement     Balance Overall balance assessment: Needs assistance Sitting-balance support: Feet supported, Bilateral upper extremity supported Sitting balance-Leahy Scale: Poor Sitting balance - Comments: sat on EOB with min guard to supervision   Standing balance support: Bilateral upper extremity supported Standing balance-Leahy Scale: Poor Standing balance comment: reliant on RW and min guard assist                           ADL either performed or assessed with clinical judgement   ADL Overall ADL's : Needs assistance/impaired                     Lower Body Dressing: Maximal assistance;Sitting/lateral leans Lower Body Dressing Details (indicate cue type and reason): to donn socks Toilet Transfer: Minimal assistance;BSC/3in1 Toilet Transfer Details (indicate cue type and reason): ambulated to bathroom but decided toilet was too low and transferred to [place] and Hygiene: Supervision/safety;Sitting/lateral lean Toileting - Clothing Manipulation Details (indicate cue type and reason): performed toilet hygiene seated       General ADL Comments: cues for safety and pursed lip breathing    Extremity/Trunk Assessment              Vision       Perception  Praxis      Cognition Arousal/Alertness: Awake/alert Behavior During Therapy: WFL for tasks assessed/performed, Flat affect Overall Cognitive Status: Within Functional Limits for tasks assessed                                 General Comments: stated she had a fear of falling        Exercises      Shoulder  Instructions       General Comments      Pertinent Vitals/ Pain       Pain Assessment Pain Assessment: Faces Faces Pain Scale: Hurts a little bit Pain Location: L LE Pain Descriptors / Indicators: Discomfort Pain Intervention(s): Monitored during session, Repositioned  Home Living                                          Prior Functioning/Environment              Frequency  Min 2X/week        Progress Toward Goals  OT Goals(current goals can now be found in the care plan section)  Progress towards OT goals: Progressing toward goals  Acute Rehab OT Goals OT Goal Formulation: With patient Time For Goal Achievement: 06/28/22 Potential to Achieve Goals: Good ADL Goals Pt Will Perform Grooming: with set-up;sitting (at sink) Pt Will Perform Lower Body Bathing: with supervision;sit to/from stand;with adaptive equipment Pt Will Perform Lower Body Dressing: with supervision;sit to/from stand;with adaptive equipment Pt Will Transfer to Toilet: with supervision;stand pivot transfer;bedside commode Pt Will Perform Toileting - Clothing Manipulation and hygiene: with supervision;sit to/from stand Additional ADL Goal #1: Pt will monitor need for rest breaks and pursed lip breathing strategies during exertion.  Plan Discharge plan remains appropriate    Co-evaluation                 AM-PAC OT "6 Clicks" Daily Activity     Outcome Measure   Help from another person eating meals?: None Help from another person taking care of personal grooming?: A Little Help from another person toileting, which includes using toliet, bedpan, or urinal?: A Lot Help from another person bathing (including washing, rinsing, drying)?: A Lot Help from another person to put on and taking off regular upper body clothing?: A Little Help from another person to put on and taking off regular lower body clothing?: Total 6 Click Score: 15    End of Session Equipment Utilized  During Treatment: Gait belt;Rolling walker (2 wheels);Oxygen (4 liters)  OT Visit Diagnosis: Unsteadiness on feet (R26.81);Other abnormalities of gait and mobility (R26.89);Other (comment)   Activity Tolerance Patient tolerated treatment well   Patient Left in chair;with call bell/phone within reach;with chair alarm set   Nurse Communication Mobility status        Time: 2778-2423 OT Time Calculation (min): 27 min  Charges: OT General Charges $OT Visit: 1 Visit OT Treatments $Self Care/Home Management : 23-37 mins  Lodema Hong, [place]  Office [place] 06/17/2022, 11:07 AM

## 2022-06-17 NOTE — TOC Progression Note (Addendum)
Transition of Care [facility]) - Progression Note    Patient Details  Name: [name] MRN: [id number] Date of Birth: [birth year]  Transition of Care [facility]) CM/SW [place], LCSW Phone Number: 06/17/2022, 11:20 AM  Clinical Narrative:    11:20am-CSW received request from [facility] to see if patient would be accepted by SNF per son's request. CSW will contact SNFs to see options. SNF would need to order Bipap for patient if medically necessary.   12:15pm-CSW and RNCM met with patient's son, [name] at bedside. He stated that he would like to resume plan to take patient home and not SNF. He reported he is not comfortable having home health services come to the house now that an APS report has been made. RNCM to follow up on NIV delivery.    Expected Discharge Plan: [place] Barriers to Discharge: Continued Medical Work up  Expected Discharge Plan and Services Expected Discharge Plan: [place] arrangements for the past 2 months: Single Family Home                                       Social Determinants of Health (SDOH) Interventions    Readmission Risk Interventions    11/07/2020    3:49 PM  Readmission Risk Prevention Plan  Transportation Screening Complete  PCP or Specialist Appt within 3-5 Days Complete  HRI or [place] Patient refused  Social Work Consult for [place] Planning/Counseling Complete  Palliative Care Screening Not Applicable

## 2022-06-17 NOTE — NC FL2 (Signed)
[place] LEVEL OF CARE SCREENING TOOL     IDENTIFICATION  Patient Name: [name] Birthdate: November 02, 1931 Sex: female Admission Date (Current Location): 06/13/2022  [facility] and [place] Number:  Herbalist and Address:  The . [facility], [place] [address] [place], [place] [address]      Provider Number: [id number]  Attending Physician Name and Address:  Thurnell Lose, MD  Relative Name and Phone Number:       Current Level of Care: Hospital Recommended Level of Care: [place] Prior Approval Number:    Date Approved/Denied:   PASRR Number: [id number]  Discharge Plan: SNF    Current Diagnoses: Patient Active Problem List   Diagnosis Date Noted   Hyperkalemia 06/13/2022   Leukocytosis 06/13/2022   Acute on chronic renal insufficiency 06/13/2022   Urinary frequency 06/05/2022   Urinary tract infection due to Pseudomonas aeruginosa 05/18/2022   Hyponatremia 05/15/2022   Normocytic anemia 05/15/2022   Acute on chronic respiratory failure with hypoxia and hypercapnia ([place]) 05/14/2022   DNR (do not resuscitate) 05/14/2022   Dysuria 02/15/2021   Suspected sleep apnea 11/08/2020   Hearing loss of left ear due to cerumen impaction 09/22/2020   Osteoarthritis 09/22/2020   Gout 03/31/2020   Morbid obesity ([place]) 09/21/2019   Acute kidney injury superimposed on chronic kidney disease ([place]) 05/39/7673   Acute metabolic encephalopathy 41/93/7902   Anemia of chronic renal failure, stage 2 (mild) 02/18/2017   Diabetic polyneuropathy associated with type 2 diabetes mellitus ([place]) 02/18/2017   Vitamin D deficiency 02/18/2017   COPD (chronic obstructive pulmonary disease) (HCC)    Acute on chronic diastolic CHF (congestive heart failure) ([place])    Pulmonary hypertension ([place])    Hypertensive retinopathy of both eyes 09/26/2015   Pseudophakia of both eyes 09/26/2015   Diabetes with neurologic complications ([place]) 40/97/3532    Mixed dyslipidemia 03/18/2011   Hypertension, essential     Orientation RESPIRATION BLADDER Height & Weight     Self, Time, Situation, Place  O2 (4L nasal cannula; will need Bipap ordered (Ipap 16/Epap 6)) Incontinent, External catheter Weight: 201 lb 1 oz (91.2 kg) Height:  '5\' 4"'$  (162.6 cm)  BEHAVIORAL SYMPTOMS/MOOD NEUROLOGICAL BOWEL NUTRITION STATUS      Continent Diet (See dc summary)  AMBULATORY STATUS COMMUNICATION OF NEEDS Skin     Verbally Normal                       Personal Care Assistance Level of Assistance  Bathing, Feeding, Dressing Bathing Assistance: Limited assistance Feeding assistance: Independent Dressing Assistance: Limited assistance     Functional Limitations Info  Sight Sight Info: Impaired        SPECIAL CARE FACTORS FREQUENCY  PT (By licensed PT), OT (By licensed OT)     PT Frequency: 5x/week OT Frequency: 5x/week            Contractures Contractures Info: Not present    Additional Factors Info  Code Status, Allergies Code Status Info: DNR Allergies Info: Atenolol, Chlorhexidine, Codeine, Lactose Intolerance (Gi), Lodine (Etodolac), Sulfa Drugs Cross Reactors, Sulfamethoxazole, Benzonatate, Escitalopram Oxalate, Metformin, Oxycodone-acetaminophen, Pregabalin           Current Medications (06/17/2022):  This is the current hospital active medication list Current Facility-Administered Medications  Medication Dose Route Frequency Provider Last Rate Last Admin   acetaminophen (TYLENOL) tablet 650 mg  650 mg Oral Q6H PRN Norval Morton, MD  Or   acetaminophen (TYLENOL) suppository 650 mg  650 mg Rectal Q6H PRN Fuller Plan A, MD       acidophilus (RISAQUAD) capsule 1 capsule  1 capsule Oral Daily Smith, Rondell A, MD   1 capsule at 06/17/22 0820   albuterol (PROVENTIL) (2.5 MG/3ML) 0.083% nebulizer solution 2.5 mg  2.5 mg Nebulization Q4H PRN Norval Morton, MD       ALPRAZolam Duanne Moron) tablet 0.125 mg  0.125 mg Oral BID  PRN Thurnell Lose, MD   0.125 mg at 06/16/22 2143   aspirin EC tablet 81 mg  81 mg Oral q morning Fuller Plan A, MD   81 mg at 06/17/22 0820   atorvastatin (LIPITOR) tablet 40 mg  40 mg Oral Daily Tamala Julian, Rondell A, MD   40 mg at 06/17/22 0820   diclofenac Sodium (VOLTAREN) 1 % topical gel 1 Application  1 Application Topical QHS Fuller Plan A, MD   1 Application at 89/38/10 2149   enoxaparin (LOVENOX) injection 30 mg  30 mg Subcutaneous Q24H Smith, Rondell A, MD   30 mg at 06/17/22 0821   fluticasone furoate-vilanterol (BREO ELLIPTA) 200-25 MCG/ACT 1 puff  1 puff Inhalation QPM Smith, Rondell A, MD   1 puff at 06/16/22 1958   furosemide (LASIX) tablet 40 mg  40 mg Oral BID Thurnell Lose, MD       gabapentin (NEURONTIN) capsule 100 mg  100 mg Oral BID Tamala Julian, Rondell A, MD   100 mg at 06/17/22 1751   guaiFENesin (ROBITUSSIN) 100 MG/5ML liquid 5 mL  5 mL Oral Q4H PRN Amin, Ankit Chirag, MD       hydrALAZINE (APRESOLINE) injection 10 mg  10 mg Intravenous Q4H PRN Amin, Ankit Chirag, MD       hydrALAZINE (APRESOLINE) tablet 25 mg  25 mg Oral BID Tamala Julian, Rondell A, MD   25 mg at 06/17/22 0824   insulin aspart (novoLOG) injection 0-15 Units  0-15 Units Subcutaneous TID WC Fuller Plan A, MD   3 Units at 06/17/22 0826   insulin glargine-yfgn (SEMGLEE) injection 10 Units  10 Units Subcutaneous QHS Fuller Plan A, MD   10 Units at 06/16/22 2145   metoprolol tartrate (LOPRESSOR) injection 5 mg  5 mg Intravenous Q4H PRN Amin, Ankit Chirag, MD       metoprolol tartrate (LOPRESSOR) tablet 25 mg  25 mg Oral BID Fuller Plan A, MD   25 mg at 06/17/22 0258   polyvinyl alcohol (LIQUIFILM TEARS) 1.4 % ophthalmic solution 1 drop  1 drop Both Eyes PRN Smith, Rondell A, MD       senna-docusate (Senokot-S) tablet 1 tablet  1 tablet Oral QHS PRN Amin, Ankit Chirag, MD       sodium chloride flush (NS) 0.9 % injection 3 mL  3 mL Intravenous Q12H Smith, Rondell A, MD   3 mL at 06/17/22 0829    spironolactone (ALDACTONE) tablet 25 mg  25 mg Oral Daily Thurnell Lose, MD         Discharge Medications: Please see discharge summary for a list of discharge medications.  Relevant Imaging Results:  Relevant Lab Results:   Additional Information NID:[id number]  Benard Halsted, LCSW

## 2022-06-17 NOTE — Progress Notes (Signed)
Physical Therapy Treatment Patient Details Name: [name] MRN: [id number] DOB: 11-16-1930 Today's Date: 06/17/2022   History of Present Illness Pt is an 88 year old woman admitted on 06/13/22 with acute on chronic hypoxia and hypercapnea secondary to dCHF exacerbation. Pt requiring bipap upon admission. Admitted last month with similar presentation PMH: CKD, COPD on 3L 02, CHF, pericardial effusion, HTN, anasarca, DM, morbid obesity.    PT Comments    Pt received in supine, sleeping but awoken easily, pt and caregiver instructed on use of IS and LE HEP. Pt needing min guard for repositioning up higher in bed for safer posture once dinner arrived. Pt with limited session due to deferring OOB and wanting to eat once food arrived. Handouts given to caregiver to reinforce exercises. Plan to assess transfers next session if pt more alert/cooperative. Pt continues to benefit from PT services to progress toward functional mobility goals.   Recommendations for follow up therapy are one component of a multi-disciplinary discharge planning process, led by the attending physician.  Recommendations may be updated based on patient status, additional functional criteria and insurance authorization.  Follow Up Recommendations  Home health PT     Assistance Recommended at Discharge Frequent or constant Supervision/Assistance  Patient can return home with the following A little help with walking and/or transfers;A little help with bathing/dressing/bathroom;Assistance with cooking/housework;Direct supervision/assist for medications management;Assist for transportation;Help with stairs or ramp for entrance   Equipment Recommendations  None recommended by PT    Recommendations for Other Services       Precautions / Restrictions Precautions Precautions: Fall Precaution Comments: pt on chronic 3LO2 - on 4LO2 presently Restrictions Weight Bearing Restrictions: No     Mobility  Bed Mobility Overal bed  mobility: Needs Assistance Bed Mobility: Supine to Sit     Supine to sit: Min guard, HOB elevated     General bed mobility comments: to long sitting while pillow placed behind back for improved upright position to eat. Pt defers OOB or EOB due to arrival of dinner.    Transfers      General transfer comment: pt defers, wanting to eat       Balance Overall balance assessment: Needs assistance Sitting-balance support: Bilateral upper extremity supported Sitting balance-Leahy Scale: Poor Sitting balance - Comments: pt defers EOB; needing minA for long sitting in bed       Standing balance comment: pt refused                            Cognition Arousal/Alertness: Awake/alert Behavior During Therapy: WFL for tasks assessed/performed, Flat affect Overall Cognitive Status: Within Functional Limits for tasks assessed        General Comments: pt focused on dinner tray which arrived during session and tangential, son present and receptive to teaching for HEP/IS use for caregiver instruction.        Exercises Other Exercises Other Exercises: supine BLE AROM: ankle pumps Other Exercises: pt and son instructed on IS use and frequency/indication with handout given to reinforce Other Exercises: "general strengthening: supine" handout given and reviewed.    General Comments General comments (skin integrity, edema, etc.): BP soft MAP (65), attempted to check it again once HOB elevated but cuff not reading. Reviewed positioning for aspiration prevention during meals with HOB elevated and use of bed chair posture buttons;  improved low back support with use of pillow (son present and receptive to instruction) and importance of continued OOB  mobility for pressure relief/strengthening.      Pertinent Vitals/Pain Pain Assessment Pain Assessment: Faces Faces Pain Scale: Hurts a little bit Pain Location: L LE Pain Descriptors / Indicators: Discomfort Pain Intervention(s):  Monitored during session, Repositioned           PT Goals (current goals can now be found in the care plan section) Acute Rehab PT Goals Patient Stated Goal: d.c home PT Goal Formulation: With patient Time For Goal Achievement: 06/28/22 Progress towards PT goals: Progressing toward goals    Frequency    Min 3X/week      PT Plan Current plan remains appropriate       AM-PAC PT "6 Clicks" Mobility   Outcome Measure  Help needed turning from your back to your side while in a flat bed without using bedrails?: A Little Help needed moving from lying on your back to sitting on the side of a flat bed without using bedrails?: A Little Help needed moving to and from a bed to a chair (including a wheelchair)?: A Lot (based on prior performance) Help needed standing up from a chair using your arms (e.g., wheelchair or bedside chair)?: A Lot (based on prior performance) Help needed to walk in hospital room?: Total Help needed climbing 3-5 steps with a railing? : Total 6 Click Score: 12    End of Session Equipment Utilized During Treatment: Oxygen Activity Tolerance: Patient limited by fatigue;Other (comment) (soft BP, RN aware) Patient left: in bed;with call bell/phone within reach;with bed alarm set;with family/visitor present (son present; bed in chair posture to eat) Nurse Communication: Mobility status PT Visit Diagnosis: Other abnormalities of gait and mobility (R26.89)     Time: 1700-1717 PT Time Calculation (min) (ACUTE ONLY): 17 min  Charges:  $Therapeutic Activity: 8-22 mins                     Mong Neal P., PTA Acute Rehabilitation Services Secure Chat Preferred 9a-5:30pm Office: [place] 06/17/2022, 6:34 PM

## 2022-06-17 NOTE — Progress Notes (Signed)
Pt currently on home BIPAP.

## 2022-06-17 NOTE — Progress Notes (Signed)
PROGRESS NOTE    [name]  JHE:[id number] DOB: 01-27-1931 DOA: 06/13/2022 PCP: Hoyt Koch, MD   Brief Narrative:  88 year old with history of HTN, HLD, CAD, diastolic CHF, COPD on 2-3 L nasal cannula, DM2, morbid obesity presented with shortness of breath.  Initially patient was found to be lethargic.  She was recently admitted for hypoxic respiratory failure requiring BiPAP which was thought to be due to fluid overload.   Assessment & Plan:   Acute on chronic respiratory failure with hypoxia and hypercapnia secondary to acute on chronic diastolic congestive heart failure, EF 65%, grade 2 DD  - Hypoxia secondary to fluid overload, has been adequately diuresed with Lasix and Aldactone, placed on fluid restriction and TED stockings, seen by cardiology, with diuresis clinically she is much improved.  Initially required BiPAP now back to nasal cannula, at baseline currently using between 4 to 5 L nasal cannula oxygen.  Continue to advance activity titrate down oxygen, now down to 4 L nasal cannula oxygen which is close to her baseline, transition to oral diuretic regimen will monitor closely.  Her bicarb levels are close to her baseline now.  If continues to improve likely discharge in the next 1 to 2 days.  Acute metabolic encephalopathy -likely some element of age-related of cognitive decline at baseline, per son she does get confused once in a while at home, some element of hospital-acquired delirium or metabolic encephalopathy in the hospital.  No focal deficits or headache.  Continue to monitor.  Initially might have had some CO2 narcosis but now stable.  Minimize benzodiazepines, she does take some Xanax at home on a as needed basis will use as less frequently as possible.  COPD, without exacerbation - Not an active exacerbation, continue home bronchodilators   Renal insufficiency superimposed chronic kidney disease stage IIIb - Around baseline of 1.5.  Continue to monitor    Controlled diabetes mellitus type 2, with hyperglycemia - Last A1c 6.9 05/15/2022.  Semglee 10 units at bedtime, sliding scale.  Adjust as needed   Essential hypertension - Continue Lopressor 25 mg twice daily, hydralazine 25 mg twice daily.  With diuretics and as needed IV hydralazine orders   Hyponatremia  Acute.  Due to CHF resolved after diuresis.   Normocytic anemia - Hemoglobin at baseline of 9.5  Hyperlipidemia  -- Lipitor    Suspected sleep apnea  - Needs outpatient sleep study  Goals of care discussion.  Seen by palliative care.  Patient is DNR     DVT prophylaxis: Place TED hose Start: 06/15/22 1058 enoxaparin (LOVENOX) injection 30 mg Start: 06/13/22 1000 Code Status: DNR Family Communication:  updated son bedside on 06/16/2022, 06/17/2022  Status is: Inpatient Remains inpatient appropriate because: Working on weaning her off BiPAP thereafter nasal cannula.  In the meantime aggressive IV diuretics.  Subjective: Patient in bed, appears comfortable, denies any headache, no fever, no chest pain or pressure, no shortness of breath , no abdominal pain. No new focal weakness.   Examination:  Awake Alert, No new F.N deficits, Normal affect [place].AT,PERRAL Supple Neck, No JVD,   Symmetrical Chest wall movement, Good air movement bilaterally, CTAB RRR,No Gallops, Rubs or new Murmurs,  +ve B.Sounds, Abd Soft, No tenderness,   No Cyanosis, Clubbing or edema     Objective:  Vitals:   06/17/22 0400 06/17/22 0643 06/17/22 0753 06/17/22 0824  BP: (!) 116/54  92/62 120/61  Pulse: 76 65 69   Resp: 16  17   Temp: 97.8  F (36.6 C)  97.6 F (36.4 C)   TempSrc: Oral  Oral   SpO2: 92% 94% 93%   Weight:      Height:        Intake/Output Summary (Last 24 hours) at 06/17/2022 0957 Last data filed at 06/17/2022 0518 Gross per 24 hour  Intake 360 ml  Output 1800 ml  Net -1440 ml   Filed Weights   06/14/22 0433 06/16/22 0441 06/17/22 0100  Weight: 98.6 kg 93.3 kg 91.2 kg      Data Reviewed:   Recent Labs  Lab 06/13/22 0727 06/13/22 0738 06/13/22 1103 06/14/22 0346 06/15/22 0315 06/16/22 0323 06/17/22 0744  WBC 11.8*  --   --  8.0 8.8 9.0 7.5  HGB 9.8*   < > 10.5* 9.4* 9.4* 9.7* 10.0*  HCT 32.4*   < > 31.0* 31.4* 30.5* 32.0* 33.7*  PLT 233  --   --  200 211 208 227  MCV 91.5  --   --  91.5 90.0 89.9 91.1  MCH 27.7  --   --  27.4 27.7 27.2 27.0  MCHC 30.2  --   --  29.9* 30.8 30.3 29.7*  RDW 15.9*  --   --  15.9* 15.9* 15.8* 15.8*  LYMPHSABS 0.9  --   --   --   --  1.1 1.2  MONOABS 0.6  --   --   --   --  0.8 0.8  EOSABS 0.2  --   --   --   --  0.4 0.6*  BASOSABS 0.0  --   --   --   --  0.0 0.0   < > = values in this interval not displayed.    Recent Labs  Lab 06/13/22 0727 06/13/22 0738 06/13/22 0930 06/13/22 1103 06/14/22 0346 06/15/22 0315 06/15/22 1715 06/16/22 0323 06/17/22 0744  NA 134*   < >  --    < > 136 140 140 139 137  K 5.2*   < >  --    < > 4.9 4.5 4.4 4.3 4.0  CL 96*  --   --   --  99 98 97* 95* 94*  CO2 29  --   --   --  28 34* 34* 36* 35*  GLUCOSE 189*  --   --   --  83 114* 163* 162* 181*  BUN 42*  --   --   --  47* 43* 37* 35* 37*  CREATININE 1.75*  --   --   --  1.52* 1.46* 1.37* 1.32* 1.50*  CALCIUM 8.7*  --   --   --  8.6* 8.9 8.9 9.0 9.0  AST 17  --   --   --   --   --   --   --   --   ALT 18  --   --   --   --   --   --   --   --   ALKPHOS 69  --   --   --   --   --   --   --   --   BILITOT 0.7  --   --   --   --   --   --   --   --   ALBUMIN 2.9*  --   --   --   --   --   --   --   --   MG 2.2  --  2.4  --   --  2.1 2.0 2.0 2.2  BNP 732.5*  --   --   --   --   --   --   --   --    < > = values in this interval not displayed.    Scheduled Meds:  acidophilus  1 capsule Oral Daily   aspirin EC  81 mg Oral q morning   atorvastatin  40 mg Oral Daily   diclofenac Sodium  1 Application Topical QHS   enoxaparin (LOVENOX) injection  30 mg Subcutaneous Q24H   fluticasone furoate-vilanterol  1 puff Inhalation  QPM   furosemide  40 mg Oral BID   gabapentin  100 mg Oral BID   hydrALAZINE  25 mg Oral BID   insulin aspart  0-15 Units Subcutaneous TID WC   insulin glargine-yfgn  10 Units Subcutaneous QHS   metoprolol tartrate  25 mg Oral BID   sodium chloride flush  3 mL Intravenous Q12H   spironolactone  25 mg Oral Daily   Continuous Infusions:   LOS: 4 days   Time spent= 35 mins   Lala Lund, MD [facility]  If 7PM-7AM, please contact night-coverage  06/17/2022, 9:57 AM

## 2022-06-17 NOTE — Care Management Important Message (Signed)
Important Message  Patient Details  Name: [name] MRN: [id number] Date of Birth: 1931-06-16   Medicare Important Message Given:  Yes     Jayquan Bradsher 06/17/2022, 2:50 PM

## 2022-06-17 NOTE — TOC Progression Note (Signed)
Transition of Care [facility]) - Progression Note    Patient Details  Name: [name] MRN: [id number] Date of Birth: 1931/05/15  Transition of Care [facility]) CM/SW Contact  Cyndi Bender, RN Phone Number: 06/17/2022, 2:33 PM  Clinical Narrative:     Spoke to patient and son at bedside regarding transition needs.  Son, [name], is agreeable to try [place] home health again.[name] [facility] notified.  Offered choice for Out Patient palliative. Son chose [facility]. Notified Shanita of referral.  [facility] is schedule to setup the NIV this afternoon. [name] with [facility] and I spoke to patient and son to review the process. TOC will continue to follow for needs.  Expected Discharge Plan: [place] Barriers to Discharge: Continued Medical Work up  Expected Discharge Plan and Services Expected Discharge Plan: [place] Choice: [place] arrangements for the past 2 months: Single Family Home                           HH Arranged: RN, PT, OT, Social Work, Nurse's Aide [name] Agency: [place] Date [place]: 06/17/22 Time [place]: 1432 Representative spoke with at [place]: [place] ([place]) Interventions    Readmission Risk Interventions    11/07/2020    3:49 PM  Readmission Risk Prevention Plan  Transportation Screening Complete  PCP or Specialist Appt within 3-5 Days Complete  HRI or [place] Patient refused  Social Work Consult for [place] Planning/Counseling Complete  Palliative Care Screening Not Applicable

## 2022-06-17 NOTE — Progress Notes (Signed)
Progress Note  Patient Name: [name] Date of Encounter: 06/17/2022  Primary Cardiologist:   Mertie Moores, MD   Subjective   Denies chest pain or SOB.   Inpatient Medications    Scheduled Meds:  acidophilus  1 capsule Oral Daily   aspirin EC  81 mg Oral q morning   atorvastatin  40 mg Oral Daily   diclofenac Sodium  1 Application Topical QHS   enoxaparin (LOVENOX) injection  30 mg Subcutaneous Q24H   fluticasone furoate-vilanterol  1 puff Inhalation QPM   furosemide  40 mg Oral BID   gabapentin  100 mg Oral BID   hydrALAZINE  25 mg Oral BID   insulin aspart  0-15 Units Subcutaneous TID WC   insulin glargine-yfgn  10 Units Subcutaneous QHS   metoprolol tartrate  25 mg Oral BID   sodium chloride flush  3 mL Intravenous Q12H   spironolactone  25 mg Oral Daily   Continuous Infusions:  PRN Meds: acetaminophen **OR** acetaminophen, albuterol, ALPRAZolam, guaiFENesin, hydrALAZINE, metoprolol tartrate, polyvinyl alcohol, senna-docusate   Vital Signs    Vitals:   06/17/22 0400 06/17/22 0643 06/17/22 0753 06/17/22 0824  BP: (!) 116/54  92/62 120/61  Pulse: 76 65 69   Resp: 16  17   Temp: 97.8 F (36.6 C)  97.6 F (36.4 C)   TempSrc: Oral  Oral   SpO2: 92% 94% 93%   Weight:      Height:        Intake/Output Summary (Last 24 hours) at 06/17/2022 1205 Last data filed at 06/17/2022 0518 Gross per 24 hour  Intake --  Output 1300 ml  Net -1300 ml   Filed Weights   06/14/22 0433 06/16/22 0441 06/17/22 0100  Weight: 98.6 kg 93.3 kg 91.2 kg    Telemetry    NSR, PACs, PVCs - Personally Reviewed  ECG    NA - Personally Reviewed  Physical Exam   GEN: No acute distress.   Neck: No  JVD Cardiac: RRR, no murmurs, rubs, or gallops.  Respiratory: Clear  to auscultation bilaterally. GI: Soft, nontender, non-distended  MS: No  edema; No deformity. Neuro:  Nonfocal  Psych: Normal affect   Labs    Chemistry Recent Labs  Lab 06/13/22 0727 06/13/22 0738  06/15/22 1715 06/16/22 0323 06/17/22 0744  NA 134*   < > 140 139 137  K 5.2*   < > 4.4 4.3 4.0  CL 96*   < > 97* 95* 94*  CO2 29   < > 34* 36* 35*  GLUCOSE 189*   < > 163* 162* 181*  BUN 42*   < > 37* 35* 37*  CREATININE 1.75*   < > 1.37* 1.32* 1.50*  CALCIUM 8.7*   < > 8.9 9.0 9.0  PROT 5.6*  --   --   --   --   ALBUMIN 2.9*  --   --   --   --   AST 17  --   --   --   --   ALT 18  --   --   --   --   ALKPHOS 69  --   --   --   --   BILITOT 0.7  --   --   --   --   GFRNONAA 28*   < > 37* 39* 33*  ANIONGAP 9   < > '9 8 8   '$ < > = values in this interval not displayed.  Hematology Recent Labs  Lab 06/15/22 0315 06/16/22 0323 06/17/22 0744  WBC 8.8 9.0 7.5  RBC 3.39* 3.56* 3.70*  HGB 9.4* 9.7* 10.0*  HCT 30.5* 32.0* 33.7*  MCV 90.0 89.9 91.1  MCH 27.7 27.2 27.0  MCHC 30.8 30.3 29.7*  RDW 15.9* 15.8* 15.8*  PLT 211 208 227    Cardiac EnzymesNo results for input(s): "TROPONINI" in the last 168 hours. No results for input(s): "TROPIPOC" in the last 168 hours.   BNP Recent Labs  Lab 06/13/22 0727  BNP 732.5*     DDimer No results for input(s): "DDIMER" in the last 168 hours.   Radiology    No results found.  Cardiac Studies   Echo:   1. Left ventricular ejection fraction, by estimation, is 60 to 65%. The  left ventricle has normal function. The left ventricle has no regional  wall motion abnormalities. There is mild concentric left ventricular  hypertrophy. Left ventricular diastolic  parameters are consistent with Grade II diastolic dysfunction  (pseudonormalization). Elevated left atrial pressure.   2. Right ventricular systolic function is normal. The right ventricular  size is normal. Tricuspid regurgitation signal is inadequate for assessing  PA pressure.   3. Left atrial size was moderately dilated.   4. The mitral valve is degenerative. Trivial mitral valve regurgitation.  No evidence of mitral stenosis. Moderate mitral annular calcification.    5. The aortic valve is tricuspid. There is mild calcification of the  aortic valve. There is mild thickening of the aortic valve. Aortic valve  regurgitation is not visualized. Aortic valve sclerosis/calcification is  present, without any evidence of  aortic stenosis.   6. The inferior vena cava is normal in size with greater than 50%  respiratory variability, suggesting right atrial pressure of 3 mmHg.   Patient Profile     88 y.o. female with history of diastolic CHF, CAD, COPD on 2-3 L oxygen at baseline, HTN, HLD, type 2 DM, morbid obesity who presented with hypoxia and hypercarbia consistent with acute on chronic respiratory failure. Cardiology was consulted for acute on chronic diastolic heart failure.  Assessment & Plan    Acute on chronic diastolic HF:   Net negative 8 liters.  It looks like she is tolerating the current Lasix/spironolactone combo.  She will need to have follow up of her creat closely.    Her son would like for her to get once daily diuretic at home.  I will decide tomorrow on dosing of possibly once daily Torsemide likely.  Check BMET in AM.   AKI:  Stage IIIB.    Creat bumped slightly today.  Follow again in AM.   OSA:  Plan out patient study.      For questions or updates, please contact [place] Please consult [email or web address] for contact info under Cardiology/STEMI.   Signed, Minus Breeding, MD  06/17/2022, 12:05 PM

## 2022-06-17 NOTE — TOC Progression Note (Signed)
Transition of Care [facility]) - Progression Note    Patient Details  Name: ALLEAN MONTFORT MRN: [id number] Date of Birth: May 26, 1931  Transition of Care [facility]) CM/SW [place], LCSW Phone Number: 06/17/2022, 10:26 AM  Clinical Narrative:    CSW received notification that [facility] has accepted the patient's case and will be investigating.    Expected Discharge Plan: [place] Barriers to Discharge: Continued Medical Work up  Expected Discharge Plan and Services Expected Discharge Plan: [place] arrangements for the past 2 months: Single Family Home                                       Social Determinants of Health (SDOH) Interventions    Readmission Risk Interventions    11/07/2020    3:49 PM  Readmission Risk Prevention Plan  Transportation Screening Complete  PCP or Specialist Appt within 3-5 Days Complete  HRI or [place] Patient refused  Social Work Consult for [place] Planning/Counseling Complete  Palliative Care Screening Not Applicable

## 2022-06-18 ENCOUNTER — Other Ambulatory Visit (HOSPITAL_COMMUNITY): Payer: Self-pay

## 2022-06-18 ENCOUNTER — Other Ambulatory Visit: Payer: Self-pay | Admitting: Cardiology

## 2022-06-18 DIAGNOSIS — J9622 Acute and chronic respiratory failure with hypercapnia: Secondary | ICD-10-CM | POA: Diagnosis not present

## 2022-06-18 DIAGNOSIS — J9621 Acute and chronic respiratory failure with hypoxia: Secondary | ICD-10-CM | POA: Diagnosis not present

## 2022-06-18 LAB — BASIC METABOLIC PANEL
Anion gap: 9 (ref 5–15)
BUN: 41 mg/dL — ABNORMAL HIGH (ref 8–23)
CO2: 31 mmol/L (ref 22–32)
Calcium: 8.7 mg/dL — ABNORMAL LOW (ref 8.9–10.3)
Chloride: 97 mmol/L — ABNORMAL LOW (ref 98–111)
Creatinine, Ser: 1.57 mg/dL — ABNORMAL HIGH (ref 0.44–1.00)
GFR, Estimated: 32 mL/min — ABNORMAL LOW (ref >60)
Glucose, Bld: 168 mg/dL — ABNORMAL HIGH (ref 70–99)
Potassium: 4.2 mmol/L (ref 3.5–5.1)
Sodium: 137 mmol/L (ref 135–145)

## 2022-06-18 LAB — CBC WITH DIFFERENTIAL/PLATELET
Abs Immature Granulocytes: 0.04 10*3/uL (ref 0.00–0.07)
Basophils Absolute: 0.1 10*3/uL (ref 0.0–0.1)
Basophils Relative: 1 %
Eosinophils Absolute: 0.6 10*3/uL — ABNORMAL HIGH (ref 0.0–0.5)
Eosinophils Relative: 8 %
HCT: 30.9 % — ABNORMAL LOW (ref 36.0–46.0)
Hemoglobin: 9.5 g/dL — ABNORMAL LOW (ref 12.0–15.0)
Immature Granulocytes: 1 %
Lymphocytes Relative: 14 %
Lymphs Abs: 1.1 10*3/uL (ref 0.7–4.0)
MCH: 27.5 pg (ref 26.0–34.0)
MCHC: 30.7 g/dL (ref 30.0–36.0)
MCV: 89.3 fL (ref 80.0–100.0)
Monocytes Absolute: 0.6 10*3/uL (ref 0.1–1.0)
Monocytes Relative: 8 %
Neutro Abs: 5.6 10*3/uL (ref 1.7–7.7)
Neutrophils Relative %: 68 %
Platelets: 207 10*3/uL (ref 150–400)
RBC: 3.46 MIL/uL — ABNORMAL LOW (ref 3.87–5.11)
RDW: 16 % — ABNORMAL HIGH (ref 11.5–15.5)
WBC: 8.1 10*3/uL (ref 4.0–10.5)
nRBC: 0 % (ref 0.0–0.2)

## 2022-06-18 LAB — GLUCOSE, CAPILLARY
Glucose-Capillary: 194 mg/dL — ABNORMAL HIGH (ref 70–99)
Glucose-Capillary: 311 mg/dL — ABNORMAL HIGH (ref 70–99)

## 2022-06-18 LAB — MAGNESIUM: Magnesium: 2.2 mg/dL (ref 1.7–2.4)

## 2022-06-18 MED ORDER — SPIRONOLACTONE 25 MG PO TABS
25.0000 mg | ORAL_TABLET | Freq: Every day | ORAL | 0 refills | Status: DC
  Filled 2022-06-18: qty 30, 30d supply, fill #0

## 2022-06-18 MED ORDER — TORSEMIDE 10 MG PO TABS
30.0000 mg | ORAL_TABLET | Freq: Every day | ORAL | 0 refills | Status: DC
  Filled 2022-06-18: qty 90, 30d supply, fill #0

## 2022-06-18 MED ORDER — TORSEMIDE 20 MG PO TABS
30.0000 mg | ORAL_TABLET | Freq: Every day | ORAL | Status: DC
  Administered 2022-06-18: 30 mg via ORAL
  Filled 2022-06-18: qty 2

## 2022-06-18 NOTE — Care Management Important Message (Signed)
Important Message  Patient Details  Name: [name] MRN: [id number] Date of Birth: August 18, 1931   Medicare Important Message Given:  Yes     [name] [name] 06/18/2022, 8:27 AM

## 2022-06-18 NOTE — Progress Notes (Unsigned)
bmp 

## 2022-06-18 NOTE — Progress Notes (Signed)
Still awaiting for son to get back with pt's clothes so she can be discharged home.

## 2022-06-18 NOTE — Plan of Care (Signed)

## 2022-06-18 NOTE — Progress Notes (Signed)
[facility] ([facility]) Hospital Liaison Note  Notified by TOC manager of patient/family request for [facility] palliative services at home after discharge.   [facility] hospital liaison will follow patient for discharge disposition.   Please call with any hospice or outpatient palliative care related questions.   Thank you for the opportunity to participate in this patient's care.   Shanita Wicker, LCSW [facility] Hospital Liaison [phone]  

## 2022-06-18 NOTE — Consult Note (Signed)
   [facility] CM Inpatient Consult   06/18/2022  AVRIELLE FRY 1931/06/05 [id number]  [place] Organization [ACO] Patient: Medicare ACO REACH  Primary Care Provider:  Hoyt Koch, MD, [place] at [facility], is an embedded provider with a Chronic Care Management team and program, and is listed for the transition of care follow up and appointments.  Patient was screened for readmission prevention less than 30 days readmission Embedded practice service needs for post hospital care coordination. Electronic record reviewed for TOC needs for post hospital.   1:22 pm Met patient at the bedside.  States, "I'm waiting to go home, my son has gone to pick up some things."  Explained the reason for visit for post hospital follow up needs. Gave patient an appointment reminder card for PCP with a 24 hour nurse advise line number.  She said she has [place] that the hospital help set up. Son coming in at this time for transitioning home. Denies any SDOH needs for medications, transportation or food insecurity.  Plan: A referral request can be sent the Embedded Care Management team for [facility] needs for post hospital care. Provider listed for the [facility] call and follow up explained. Patient states no other needs at this time.  Please contact for further questions,  Natividad Brood, RN BSN [facility] Liaison  [phone] business mobile phone Toll free office [phone]  Fax number: [phone] [email or web address]

## 2022-06-18 NOTE — Discharge Instructions (Addendum)
Use the provided trilogy device at nighttime while sleeping.    Follow with Primary MD Hoyt Koch, MD and your cardiologist in 7 days   Get CBC, CMP, Magnesium, 2 view Chest X ray -  checked next visit within 1 week by Primary MD    Activity: As tolerated with Full fall precautions use walker/cane & assistance as needed  Disposition Home    Diet: Heart Healthy Low Carb, Check your Weight same time everyday, if you gain over 2 pounds, or you develop in leg swelling, experience more shortness of breath or chest pain, call your Primary MD immediately. Follow Cardiac Low Salt Diet and 1.5 lit/day fluid restriction.  Check CBGs q. [place].  Special Instructions: If you have smoked or chewed Tobacco  in the last 2 yrs please stop smoking, stop any regular Alcohol  and or any Recreational drug use.  On your next visit with your primary care physician please Get Medicines reviewed and adjusted.  Please request your Prim.MD to go over all Hospital Tests and Procedure/Radiological results at the follow up, please get all Hospital records sent to your Prim MD by signing hospital release before you go home.  If you experience worsening of your admission symptoms, develop shortness of breath, life threatening emergency, suicidal or homicidal thoughts you must seek medical attention immediately by calling 911 or calling your MD immediately  if symptoms less severe.  You Must read complete instructions/literature along with all the possible adverse reactions/side effects for all the Medicines you take and that have been prescribed to you. Take any new Medicines after you have completely understood and accpet all the possible adverse reactions/side effects.

## 2022-06-18 NOTE — TOC Transition Note (Signed)
Transition of Care [facility]) - CM/SW Discharge Note   Patient Details  Name: [name] MRN: [id number] Date of Birth: September 09, 1931  [facility] CM/SW Contact:  Carles Collet, RN Phone Number: 06/18/2022, 11:28 AM   Clinical Narrative:   Plan for DC to home today. Per chart, patient on home BiPAP overnight. Son to take BiPAP with them. Nurse verified transport- son will transport home. [place] notified of DC, they will prioritize first home visit. Medications being filled through [place] and will be sent home with patient.  No other TOC needs identified for DC    Final next level of care: [place] Barriers to Discharge: No Barriers Identified   Patient Goals and CMS Choice        Discharge Placement                       Discharge Plan and Services     Post Acute Care Choice: Home Health          DME Arranged: NIV DME Agency: [employer] Date DME Agency Contacted: 06/18/22 Time DME Agency Contacted: 1112 Representative spoke with at DME Agency: [place]: RN, PT, OT, Social Work, Nurse's Aide [place]: [place] Date [place]: 06/18/22 Time [place]: 1113 Representative spoke with at [place]: [place] ([place]) Interventions     Readmission Risk Interventions    11/07/2020    3:49 PM  Readmission Risk Prevention Plan  Transportation Screening Complete  PCP or Specialist Appt within 3-5 Days Complete  HRI or [place] Patient refused  Social Work Consult for [place] Planning/Counseling Complete  Palliative Care Screening Not Applicable

## 2022-06-18 NOTE — Progress Notes (Signed)
Pt's son went home to bring clothes and other personal belongings so AVS review is still due

## 2022-06-18 NOTE — Discharge Summary (Signed)
[name] TIR:[id number] DOB: 09/24/1931 DOA: 06/13/2022  PCP: Hoyt Koch, MD  Admit date: 06/13/2022  Discharge date: 06/18/2022  Admitted From: Home   Disposition:  Home   Recommendations for Outpatient Follow-up:   Follow up with PCP in 1-2 weeks  PCP Please obtain BMP/CBC, 2 view CXR in 1week,  (see Discharge instructions)   PCP Please follow up on the following pending results: Monitor weight, BMP, blood pressure and diuretic doses closely.   Home Health: PT, RN   Equipment/Devices: as below  Consultations: Cards Discharge Condition: Stable    CODE STATUS: Full    Diet Recommendation: Heart Healthy low carbohydrate, 1.5 L fluid restriction.  Chief Complaint  Patient presents with   Shortness of Breath     Brief history of present illness from the day of admission and additional interim summary    88 year old with history of HTN, HLD, CAD, diastolic CHF, COPD on 2-3 L nasal cannula, DM2, morbid obesity presented with shortness of breath.  Initially patient was found to be lethargic.  She was recently admitted for hypoxic respiratory failure requiring BiPAP which was thought to be due to fluid overload.                                                                 Hospital Course   Acute on chronic respiratory failure with hypoxia and hypercapnia secondary to acute on chronic diastolic congestive heart failure, EF 65%, grade 2 DD  - Hypoxia secondary to fluid overload, has been adequately diuresed with Lasix and Aldactone, placed on fluid restriction and TED stockings, seen by cardiology, with diuresis clinically she is much improved.  Initially required BiPAP now back to nasal cannula, at baseline currently using between 4 to 5 L nasal cannula oxygen.  Continue to advance activity titrate down  oxygen, now down to 4 L nasal cannula oxygen which is close to her baseline.  Case discussed with cardiologist Dr. Percival Spanish patient transition to Demadex 30 mg along with 25 of Aldactone daily, she will be discharged home, she is close to being completely compensated with only trace edema if that in her lower extremities.  PCP to monitor diuretic dose, BMP and blood pressure closely.  We will also follow-up with her primary cardiologist postdischarge.    Acute metabolic encephalopathy -likely some element of age-related of cognitive decline at baseline, per son she does get confused once in a while at home, some element of hospital-acquired delirium or metabolic encephalopathy in the hospital.  No focal deficits or headache.  Continue to monitor.  Initially might have had some CO2 narcosis but now stable.  Minimize benzodiazepines, she does take some Xanax at home on a as needed basis will use as less frequently as possible.  Close to her baseline.  May benefit  from combination of outpatient sleep study and one-time outpatient neurology evaluation will defer this to PCP.   COPD, without exacerbation - Not an active exacerbation, continue home bronchodilators   Renal insufficiency superimposed chronic kidney disease stage IIIb - Around baseline of 1.5.  Close to her baseline.   Controlled diabetes mellitus type 2, with hyperglycemia - Last A1c 6.9 05/15/2022.  New home regimen check CBGs q. [facility].   Essential hypertension - Continue Lopressor 25 mg twice daily, hydralazine 25 mg twice daily.  With diuretics and as needed IV hydralazine orders   Hyponatremia  Acute.  Due to CHF resolved after diuresis.   Normocytic anemia - Hemoglobin at baseline of 9.5    Hyperlipidemia  -- Lipitor    Suspected sleep apnea  - Needs outpatient sleep study, we will request PCP to arrange.  For now as per last admission she qualified for trilogy home vent device which was delivered this admission.  For some reason it  could not be arranged last admission or at home.   Discharge diagnosis     Principal Problem:   Acute on chronic respiratory failure with hypoxia and hypercapnia (HCC) Active Problems:   Acute on chronic diastolic CHF (congestive heart failure) (HCC)   Acute metabolic encephalopathy   Leukocytosis   Hyperkalemia   COPD (chronic obstructive pulmonary disease) (HCC)   Acute on chronic renal insufficiency   Diabetes with neurologic complications (HCC)   Hypertension, essential   Hyponatremia   Normocytic anemia   Morbid obesity [facility])    Discharge instructions    Discharge Instructions     Discharge instructions   Complete by: As directed    Use the provided trilogy device at nighttime while sleeping.    Follow with Primary MD Hoyt Koch, MD and your cardiologist in 7 days   Get CBC, CMP, Magnesium, 2 view Chest X ray -  checked next visit within 1 week by Primary MD    Activity: As tolerated with Full fall precautions use walker/cane & assistance as needed  Disposition Home    Diet: Heart Healthy Low Carb, Check your Weight same time everyday, if you gain over 2 pounds, or you develop in leg swelling, experience more shortness of breath or chest pain, call your Primary MD immediately. Follow Cardiac Low Salt Diet and 1.5 lit/day fluid restriction.  Check CBGs q. [place].  Special Instructions: If you have smoked or chewed Tobacco  in the last 2 yrs please stop smoking, stop any regular Alcohol  and or any Recreational drug use.  On your next visit with your primary care physician please Get Medicines reviewed and adjusted.  Please request your Prim.MD to go over all Hospital Tests and Procedure/Radiological results at the follow up, please get all Hospital records sent to your Prim MD by signing hospital release before you go home.  If you experience worsening of your admission symptoms, develop shortness of breath, life threatening emergency, suicidal or  homicidal thoughts you must seek medical attention immediately by calling 911 or calling your MD immediately  if symptoms less severe.  You Must read complete instructions/literature along with all the possible adverse reactions/side effects for all the Medicines you take and that have been prescribed to you. Take any new Medicines after you have completely understood and accpet all the possible adverse reactions/side effects.   Increase activity slowly   Complete by: As directed        Discharge Medications   Allergies as of  06/18/2022       Reactions   Atenolol Other (See Comments)   weakness   Chlorhexidine Other (See Comments)   Unknown reaction   Codeine Nausea And Vomiting   Hydrocodone is ok   Lactose Intolerance (gi) Diarrhea   Lodine [etodolac] Other (See Comments)   dizziness   Sulfa Drugs Cross Reactors Nausea Only   Sulfamethoxazole Nausea And Vomiting   Benzonatate Nausea And Vomiting   Escitalopram Oxalate Other (See Comments)   mild hallucinations   Metformin Nausea And Vomiting   Reaction to IR and XL   Oxycodone-acetaminophen Other (See Comments)   Percocet does not relieve pts' pain   Pregabalin Other (See Comments)   Unknown reaction        Medication List     STOP taking these medications    amLODipine 5 MG tablet Commonly known as: NORVASC   furosemide 40 MG tablet Commonly known as: Lasix       TAKE these medications    acetaminophen 500 MG tablet Commonly known as: TYLENOL Take 500 mg by mouth 2 (two) times daily as needed for mild pain or headache.   albuterol 108 (90 Base) MCG/ACT inhaler Commonly known as: VENTOLIN HFA Inhale 2 puffs into the lungs every 6 (six) hours as needed for wheezing or shortness of breath.   allopurinol 100 MG tablet Commonly known as: ZYLOPRIM TAKE 1 TABLET BY MOUTH EVERY DAY   ALPRAZolam 0.25 MG tablet Commonly known as: XANAX TAKE 1 TABLET BY MOUTH AT BEDTIME AS NEEDED FOR ANXIETY What changed:  See the new instructions.   aspirin EC 81 MG tablet Take 81 mg by mouth every morning.   atorvastatin 40 MG tablet Commonly known as: LIPITOR TAKE 1 TABLET ONCE A DAY FOR CHOLESTEROL   diclofenac Sodium 1 % Gel Commonly known as: VOLTAREN Apply 4 g topically 4 (four) times daily. To affected joint. What changed:  how much to take when to take this additional instructions   ergocalciferol 1.25 MG (50000 UT) capsule Commonly known as: VITAMIN D2 Take 1 capsule (50,000 Units total) by mouth every Friday.   fluticasone 50 MCG/ACT nasal spray Commonly known as: FLONASE Place 2 sprays into both nostrils daily. What changed:  how much to take when to take this   fluticasone furoate-vilanterol 200-25 MCG/ACT Aepb Commonly known as: BREO ELLIPTA Inhale 1 puff into the lungs daily. What changed: when to take this   gabapentin 100 MG capsule Commonly known as: NEURONTIN TAKE 1 CAPSULE BY MOUTH TWICE A DAY   hydrALAZINE 25 MG tablet Commonly known as: APRESOLINE Take 1 tablet (25 mg total) by mouth 2 (two) times daily.   insulin aspart 100 UNIT/ML injection Commonly known as: novoLOG Inject 12 Units into the skin 3 (three) times daily with meals.   insulin glargine 100 UNIT/ML injection Commonly known as: Lantus Inject 0.25 mLs (25 Units total) into the skin at bedtime. What changed: how much to take   ipratropium-albuterol 0.5-2.5 (3) MG/3ML Soln Commonly known as: DUONEB Take 3 mLs by nebulization every 6 (six) hours as needed. What changed: reasons to take this   LACTASE PO Take 1 tablet by mouth every morning.   metoprolol tartrate 25 MG tablet Commonly known as: LOPRESSOR TAKE 1 TABLET BY MOUTH TWICE A DAY   multivitamin with minerals Tabs tablet Take 1 tablet by mouth every morning.   ONE TOUCH ULTRA TEST test strip Generic drug: glucose blood 1 each by Other route as directed.  OXYGEN Inhale 3 L into the lungs continuous.   Polyethyl Glycol-Propyl  Glycol 0.4-0.3 % Soln Place 2 drops into both eyes daily as needed (dry eyes). Systane   PROBIOTIC DAILY PO Take 1 tablet by mouth every morning.   spironolactone 25 MG tablet Commonly known as: ALDACTONE Take 1 tablet (25 mg total) by mouth daily. Start taking on: June 19, 2022   torsemide 10 MG tablet Commonly known as: DEMADEX Take 3 tablets (30 mg total) by mouth daily.         Follow-up Information     [facility] Follow up.   Specialty: Home Health Services Why: Home health arranged. They should contact you within 48 hours after discharge to schedule apt Contact information: [address] [place] [address] [phone]         [facility] Follow up.   Why: They will contact you to schedule apt Contact information: [address] [place]        Hoyt Koch, MD. Schedule an appointment as soon as possible for a visit in 1 week(s).   Specialty: Internal Medicine Contact information: [place] [address] [phone]         Nahser, Wonda Cheng, MD. Schedule an appointment as soon as possible for a visit in 1 week(s).   Specialty: Cardiology Contact information: [address] [place] [address] [phone]                 Major procedures and Radiology Reports - PLEASE review detailed and final reports thoroughly  -      DG Chest Portable 1 View  Result Date: 06/13/2022 CLINICAL DATA:  88 year old female with history of dyspnea. Shortness of breath. EXAM: PORTABLE CHEST 1 VIEW COMPARISON:  Chest x-ray 05/14/2022. FINDINGS: There is cephalization of the pulmonary vasculature and slight indistinctness of the interstitial markings suggestive of mild pulmonary edema. Mild blunting of the costophrenic sulci suggestive of trace bilateral pleural effusions. No pneumothorax. Heart size is moderately enlarged. Upper mediastinal  contours are distorted by patient's rotation to the right. IMPRESSION: 1. The appearance the chest suggests mild congestive heart failure, as above. Electronically Signed   By: Vinnie Langton M.D.   On: 06/13/2022 07:53   DG Foot 2 Views Left  Result Date: 05/20/2022 CLINICAL DATA:  Pain EXAM: LEFT FOOT - 2 VIEW COMPARISON:  None Available. FINDINGS: Severe degenerative changes are noted in first metatarsophalangeal joint with joint space narrowing, bony spurs and erosive changes. No recent fracture or dislocation is seen. Osteopenia is seen in the bony structures. Deformity seen in the neck of fourth metatarsal may be residual from previous injury. There is minimal cortical irregularity in the lateral margin of head of the fifth metatarsal. Small plantar spur is seen in calcaneus. Bony spurs are noted in the dorsal aspects of intertarsal and tarsometatarsal joints. The soft tissue swelling over the dorsum and around the ankle. Arterial calcifications are seen in soft tissues. IMPRESSION: No recent displaced fracture or dislocation is seen. There is minimal cortical irregularity in the lateral margin of head of the left fifth metatarsal which may suggest recent or old undisplaced fracture. Old healed fracture is seen in the neck of the left fourth metatarsal. Severe degenerative changes are noted in first metatarsophalangeal joint. Plantar spur is seen in calcaneus. Electronically Signed   By: Elmer Picker M.D.   On: 05/20/2022 11:15  Today   Subjective    [name] today has no headache,no chest abdominal pain,no new weakness tingling or numbness, feels much better wants to go home today.     Objective   Blood pressure 107/71, pulse 82, temperature 97.8 F (36.6 C), temperature source Oral, resp. rate 20, height '5\' 4"'$  (1.626 m), weight 91.2 kg, SpO2 94 %.   Intake/Output Summary (Last 24 hours) at 06/18/2022 0938 Last data filed at 06/18/2022 0653 Gross per 24 hour  Intake --   Output 1150 ml  Net -1150 ml    Exam  Awake Alert, No new F.N deficits,    Agar.AT,PERRAL Supple Neck,   Symmetrical Chest wall movement, Good air movement bilaterally, CTAB RRR,No Gallops,   +ve B.Sounds, Abd Soft, Non tender,  No Cyanosis, trace edema   Data Review   Recent Labs  Lab 06/13/22 0727 06/13/22 0738 06/14/22 0346 06/15/22 0315 06/16/22 0323 06/17/22 0744 06/18/22 0439  WBC 11.8*  --  8.0 8.8 9.0 7.5 8.1  HGB 9.8*   < > 9.4* 9.4* 9.7* 10.0* 9.5*  HCT 32.4*   < > 31.4* 30.5* 32.0* 33.7* 30.9*  PLT 233  --  200 211 208 227 207  MCV 91.5  --  91.5 90.0 89.9 91.1 89.3  MCH 27.7  --  27.4 27.7 27.2 27.0 27.5  MCHC 30.2  --  29.9* 30.8 30.3 29.7* 30.7  RDW 15.9*  --  15.9* 15.9* 15.8* 15.8* 16.0*  LYMPHSABS 0.9  --   --   --  1.1 1.2 1.1  MONOABS 0.6  --   --   --  0.8 0.8 0.6  EOSABS 0.2  --   --   --  0.4 0.6* 0.6*  BASOSABS 0.0  --   --   --  0.0 0.0 0.1   < > = values in this interval not displayed.    Recent Labs  Lab 06/13/22 0727 06/13/22 0738 06/15/22 0315 06/15/22 1715 06/16/22 0323 06/17/22 0744 06/18/22 0439  NA 134*   < > 140 140 139 137 137  K 5.2*   < > 4.5 4.4 4.3 4.0 4.2  CL 96*   < > 98 97* 95* 94* 97*  CO2 29   < > 34* 34* 36* 35* 31  GLUCOSE 189*   < > 114* 163* 162* 181* 168*  BUN 42*   < > 43* 37* 35* 37* 41*  CREATININE 1.75*   < > 1.46* 1.37* 1.32* 1.50* 1.57*  CALCIUM 8.7*   < > 8.9 8.9 9.0 9.0 8.7*  AST 17  --   --   --   --   --   --   ALT 18  --   --   --   --   --   --   ALKPHOS 69  --   --   --   --   --   --   BILITOT 0.7  --   --   --   --   --   --   ALBUMIN 2.9*  --   --   --   --   --   --   MG 2.2   < > 2.1 2.0 2.0 2.2 2.2  BNP 732.5*  --   --   --   --   --   --    < > = values in this interval not displayed.    Total Time in preparing paper work, data evaluation  and todays exam - 35 minutes  Lala Lund M.D on 06/18/2022 at 9:38 AM  [facility]

## 2022-06-19 ENCOUNTER — Telehealth: Payer: Self-pay

## 2022-06-19 NOTE — Telephone Encounter (Addendum)
Transition Care Management Follow-up Telephone Call Date of discharge and from where: [facility] 06-18-22 Dx: acute on chronic respiratory failure with hypoxia and hypercapnia How have you been since you were released from the hospital? Doing ok - breathing is ok  Any questions or concerns? No  Items Reviewed: Did the pt receive and understand the discharge instructions provided? Yes  Medications obtained and verified? Yes  Other? No  Any new allergies since your discharge? No  Dietary orders reviewed? Yes Do you have support at home? Yes   Home Care and Equipment/Supplies: Were home health services ordered? Yes PT/ RN If so, what is the name of the agency? [facility] Has the agency set up a time to come to the patient's home? no Were any new equipment or medical supplies ordered?  No What is the name of the medical supply agency? [facility] Were you able to get the supplies/equipment? not applicable Do you have any questions related to the use of the equipment or supplies? No  Functional Questionnaire: (I = Independent and D = Dependent) ADLs: I  Bathing/Dressing- I  Meal Prep- I  Eating- I  Maintaining continence- I  Transferring/Ambulation- I  Managing Meds- D  Follow up appointments reviewed:  PCP Hospital f/u appt confirmed? Son states that he will call the office and make fu appt  [facility] f/u appt confirmed? Yes  Scheduled to see Dr Ann Maki on 06-24-22 @ 8am. Are transportation arrangements needed? No  If their condition worsens, is the pt aware to call PCP or go to the Emergency Dept.? Yes Was the patient provided with contact information for the PCP's office or ED? Yes Was to pt encouraged to call back with questions or concerns? Yes

## 2022-06-20 ENCOUNTER — Telehealth: Payer: Self-pay

## 2022-06-20 NOTE — Telephone Encounter (Signed)
Spoke with patient's son [name] and scheduled a Mychart Palliative Consult for 07/01/22 @ 2:30 PM per provider's availability.  Consent obtained; updated Netsmart, Team List and Epic.

## 2022-06-20 NOTE — Progress Notes (Deleted)
Cardiology Office Note:    Date:  06/20/2022   ID:  [name], DOB August 29, 1931, MRN [id number]  PCP:  Hoyt Koch, MD   [facility] Providers Cardiologist:  Mertie Moores, MD { Click to update primary MD,subspecialty MD or APP then REFRESH:1}    Referring MD: Hoyt Koch, *   Chief Complaint: ***  History of Present Illness:    [name] is a *** 88 y.o. female with a hx of chronic HFpEF, aortic stenosis, CAD, HTN, HLD, pericardial effusion s/p pericardial window, CKD, COPD on 2-3 L oxygen at baseline, morbid obesity, and mild aortic stenosis.   She previously followed with Dr. Mare Ferrari, now with Dr. Acie Fredrickson. Pericardial effusion with pericardial tamponade in 2010 and underwent pericardial window with good results.   She was last seen in our office on 11/12/2021 by Dr. Acie Fredrickson at which time she reported multiple falls.  Her volume status was felt to be stable and BP was well controlled.  She was advised to follow-up in 1 year.  Admission 8/10-8/15/23 for hypoxia and hypercarbia consistent with acute on chronic respiratory failure.  Cardiology was consulted for acute on chronic diastolic heart failure.  She had net -8 L on Lasix spironolactone. Echo 05/14/22 revealed LVEF 60 to 65%, mild LVH, grade 2 diastolic dysfunction, inadequate TR jet for assessing PA pressure, moderately dilated LA, aortic valve sclerosis without evidence of stenosis. Symptoms not felt to be COPD exacerbation. Baseline creatinine 1.5. She was discharged on Demadex 30 mg and Aldactone 25 mg daily.   Today, she is here   Past Medical History:  Diagnosis Date   Anasarca 04/2009   found to be secondary to pericardial effusion with tamponade/pericardial window done   Chronic diastolic CHF (congestive heart failure) (HCC)    CKD (chronic kidney disease), stage III (HCC)    COPD (chronic obstructive pulmonary disease) ([place])    Coronary artery disease    a. mild-mod CAD 2004.   Diabetes  mellitus    insulin dependent   Herpes labialis    Healing herpes labialis   Hypercholesterolemia    Hypertension    Lumbar radiculopathy    Lumbar scoliosis    Morbid obesity ([place])    Pericardial effusion 04/2009   a. 2010 with tamponade s/p window.   PONV (postoperative nausea and vomiting)    has not had any problems since 2001   Spondylolisthesis    Spondylosis     Past Surgical History:  Procedure Laterality Date   BACK SURGERY     microdiskectomy L3-4 on L/partial facetectomy 3-4 on L/removal of synovial cyst on left L3-4   BREAST BIOPSY     left   CARDIAC CATHETERIZATION  04/25/2003   L heart cath w/coronary angiography/R femoral artery/ EF 65%/no mitral regurgitation/ angiography L main coronary artery smooth & normal   EYE SURGERY     ophthalmoscopy/peritomy adjacent to the limbus from the 8 to 2:30 position superiorly   lumbar laminectomy     SUBXYPHOID PERICARDIAL WINDOW  04/2009   for pericardial effusion w/pericardial tamponade/sac drained w/20-French Baard drain/transesophageal echocardiogram confimed complete evacution of pericardial fluid   TOTAL HIP ARTHROPLASTY  03/06/2012   Procedure: TOTAL HIP ARTHROPLASTY;  Surgeon: Kerin Salen, MD;  Location: [place];  Service: Orthopedics;  Laterality: Left;  DEPUY/PINNACLE, SUMMIT STEM, CUP POLY & CERAMIC   VAGINAL HYSTERECTOMY      Current Medications: No outpatient medications have been marked as taking for the 06/24/22 encounter (Appointment) with  Soua Caltagirone, Lanice Schwab, NP.     Allergies:   Atenolol, Chlorhexidine, Codeine, Lactose intolerance (gi), Lodine [etodolac], Sulfa drugs cross reactors, Sulfamethoxazole, Benzonatate, Escitalopram oxalate, Metformin, Oxycodone-acetaminophen, and Pregabalin   Social History   Socioeconomic History   Marital status: Widowed    Spouse name: Not on file   Number of children: Not on file   Years of education: Not on file   Highest education level: Not on file  Occupational  History   Not on file  Tobacco Use   Smoking status: Never   Smokeless tobacco: Never  Substance and Sexual Activity   Alcohol use: No   Drug use: No   Sexual activity: Not Currently  Other Topics Concern   Not on file  Social History Narrative   Not on file   Social Determinants of Health   Financial Resource Strain: Low Risk  (06/21/2021)   Overall Financial Resource Strain (CARDIA)    Difficulty of Paying Living Expenses: Not hard at all  Food Insecurity: No Food Insecurity (06/21/2021)   Hunger Vital Sign    Worried About Running Out of Food in the Last Year: Never true    [place] in the Last Year: Never true  Transportation Needs: No Transportation Needs (06/21/2021)   PRAPARE - Hydrologist (Medical): No    Lack of Transportation (Non-Medical): No  Physical Activity: Insufficiently Active (06/21/2021)   Exercise Vital Sign    Days of Exercise per Week: 5 days    Minutes of Exercise per Session: 10 min  Stress: No Stress Concern Present (06/21/2021)   [place]    Feeling of Stress : Not at all  Social Connections: Socially Isolated (06/21/2021)   Social Connection and Isolation Panel [NHANES]    Frequency of Communication with Friends and Family: More than three times a week    Frequency of Social Gatherings with Friends and Family: More than three times a week    Attends Religious Services: Never    Marine scientist or Organizations: No    Attends Music therapist: Never    Marital Status: Never married     Family History: The patient's ***family history includes Angina in her mother; Coronary artery disease in an other family member; Heart attack in her father; Hypertension in her father; Stroke in her mother.  ROS:   Please see the history of present illness.    *** All other systems reviewed and are negative.  Labs/Other Studies Reviewed:     The following studies were reviewed today:  Echo 05/14/22  1. Left ventricular ejection fraction, by estimation, is 60 to 65%. The  left ventricle has normal function. The left ventricle has no regional  wall motion abnormalities. There is mild concentric left ventricular  hypertrophy. Left ventricular diastolic  parameters are consistent with Grade II diastolic dysfunction  (pseudonormalization). Elevated left atrial pressure.   2. Right ventricular systolic function is normal. The right ventricular  size is normal. Tricuspid regurgitation signal is inadequate for assessing  PA pressure.   3. Left atrial size was moderately dilated.   4. The mitral valve is degenerative. Trivial mitral valve regurgitation.  No evidence of mitral stenosis. Moderate mitral annular calcification.   5. The aortic valve is tricuspid. There is mild calcification of the  aortic valve. There is mild thickening of the aortic valve. Aortic valve  regurgitation is not visualized. Aortic valve sclerosis/calcification  is  present, without any evidence of  aortic stenosis.   6. The inferior vena cava is normal in size with greater than 50%  respiratory variability, suggesting right atrial pressure of 3 mmHg.   Comparison(s): Prior images reviewed side by side. There is evidence of  high mean left atrial pressure, based on E/e' ratio.    Recent Labs: 05/16/2022: TSH 0.918 06/13/2022: ALT 18; B Natriuretic Peptide 732.5 06/18/2022: BUN 41; Creatinine, Ser 1.57; Hemoglobin 9.5; Magnesium 2.2; Platelets 207; Potassium 4.2; Sodium 137  Recent Lipid Panel    Component Value Date/Time   CHOL 156 11/12/2021 1355   TRIG 141 11/12/2021 1355   HDL 47 11/12/2021 1355   CHOLHDL 3.3 11/12/2021 1355   CHOLHDL 3.1 04/21/2009 0620   VLDL 19 04/21/2009 0620   LDLCALC 84 11/12/2021 1355     Risk Assessment/Calculations:   {Does this patient have ATRIAL FIBRILLATION?[phone]}       Physical Exam:    VS:  There  were no vitals taken for this visit.    Wt Readings from Last 3 Encounters:  06/17/22 201 lb 1 oz (91.2 kg)  06/04/22 206 lb 12.8 oz (93.8 kg)  05/20/22 209 lb 14.1 oz (95.2 kg)     GEN: *** Well nourished, well developed in no acute distress HEENT: Normal NECK: No JVD; No carotid bruits CARDIAC: ***RRR, no murmurs, rubs, gallops RESPIRATORY:  Clear to auscultation without rales, wheezing or rhonchi  ABDOMEN: Soft, non-tender, non-distended MUSCULOSKELETAL:  No edema; No deformity. *** pedal pulses, ***bilaterally SKIN: Warm and dry NEUROLOGIC:  Alert and oriented x 3 PSYCHIATRIC:  Normal affect   EKG:  EKG is *** ordered today.  The ekg ordered today demonstrates ***  Diagnoses:    No diagnosis found. Assessment and Plan:     Chronic HFpEF Hypertension: Aortic stenosis Hyperlipidemia  {Are you ordering a CV Procedure (e.g. stress test, cath, DCCV, TEE, etc)?   Press F2        :[id number]}    Medication Adjustments/Labs and Tests Ordered: Current medicines are reviewed at length with the patient today.  Concerns regarding medicines are outlined above.  No orders of the defined types were placed in this encounter.  No orders of the defined types were placed in this encounter.   There are no Patient Instructions on file for this visit.   Signed, Emmaline Life, NP  06/20/2022 4:35 PM    [facility]

## 2022-06-23 NOTE — Progress Notes (Unsigned)
Cardiology Office Note:    Date:  06/25/2022   ID:  JERRY HAUGEN, DOB 1930/12/17, MRN [id number]  PCP:  Hoyt Koch, MD   [facility] Providers Cardiologist:  Mertie Moores, MD     Referring MD: Hoyt Koch, *   Chief Complaint: hospital follow-up CHF  History of Present Illness:    [name] is a very pleasant 88 y.o. female with a hx of chronic HFpEF, aortic stenosis, CAD, HTN, HLD, pericardial effusion s/p pericardial window, CKD, COPD on 2-3 L oxygen at baseline, morbid obesity, and mild aortic stenosis.   She previously followed with Dr. Mare Ferrari, now with Dr. Acie Fredrickson. Pericardial effusion with pericardial tamponade in 2010 and underwent pericardial window with good results.  History of coronary artery calcifications on CT.   She was last seen in our office on 11/12/2021 by Dr. Acie Fredrickson at which time she reported multiple falls.  Her volume status was felt to be stable and BP was well controlled.  She was advised to follow-up in 1 year.  Admission 8/10-8/15/23 for hypoxia and hypercarbia consistent with acute on chronic respiratory failure.  Cardiology was consulted for acute on chronic diastolic heart failure.  She had net -8 L on Lasix and spironolactone. Echo 05/14/22 revealed LVEF 60 to 65%, mild LVH, grade 2 diastolic dysfunction, inadequate TR jet for assessing PA pressure, moderately dilated LA, aortic valve sclerosis without evidence of stenosis. Symptoms not felt to be COPD exacerbation. Baseline creatinine 1.5. She was discharged on Demadex 30 mg and Aldactone 25 mg daily.   Today, she is here with her son who lives with her. Is in a wheelchair today with portable oxygen, states has used O2 for a while, now requires it almost 100% of time at 2-4 L/min. Her son reports she desats very quickly if not using oxygen.  Reports doing well since hospital discharge. Uses wheelchair at home, is able to transfer herself to bed and to toilet. Weight has been stable. Now  using BiPAP and nebulizer at home. Is feeling better since prior to hospitalization. She denies chest pain, palpitations, significant edema, orthopnea, PND, presyncope, syncope. Lengthy discussion about monitoring s/s of CHF on a consistent basis. Son cooks most meals and they try to avoid high sodium foods.   Past Medical History:  Diagnosis Date   Anasarca 04/2009   found to be secondary to pericardial effusion with tamponade/pericardial window done   Chronic diastolic CHF (congestive heart failure) (HCC)    CKD (chronic kidney disease), stage III (HCC)    COPD (chronic obstructive pulmonary disease) ([place])    Coronary artery disease    a. mild-mod CAD 2004.   Diabetes mellitus    insulin dependent   Herpes labialis    Healing herpes labialis   Hypercholesterolemia    Hypertension    Lumbar radiculopathy    Lumbar scoliosis    Morbid obesity ([place])    Pericardial effusion 04/2009   a. 2010 with tamponade s/p window.   PONV (postoperative nausea and vomiting)    has not had any problems since 2001   Spondylolisthesis    Spondylosis     Past Surgical History:  Procedure Laterality Date   BACK SURGERY     microdiskectomy L3-4 on L/partial facetectomy 3-4 on L/removal of synovial cyst on left L3-4   BREAST BIOPSY     left   CARDIAC CATHETERIZATION  04/25/2003   L heart cath w/coronary angiography/R femoral artery/ EF 65%/no mitral regurgitation/ angiography L main  coronary artery smooth & normal   EYE SURGERY     ophthalmoscopy/peritomy adjacent to the limbus from the 8 to 2:30 position superiorly   lumbar laminectomy     SUBXYPHOID PERICARDIAL WINDOW  04/2009   for pericardial effusion w/pericardial tamponade/sac drained w/20-French Baard drain/transesophageal echocardiogram confimed complete evacution of pericardial fluid   TOTAL HIP ARTHROPLASTY  03/06/2012   Procedure: TOTAL HIP ARTHROPLASTY;  Surgeon: Kerin Salen, MD;  Location: [place];  Service: Orthopedics;  Laterality:  Left;  DEPUY/PINNACLE, SUMMIT STEM, CUP POLY & CERAMIC   VAGINAL HYSTERECTOMY      Current Medications: Current Meds  Medication Sig   acetaminophen (TYLENOL) 500 MG tablet Take 500 mg by mouth 2 (two) times daily as needed for mild pain or headache.   albuterol (VENTOLIN HFA) 108 (90 Base) MCG/ACT inhaler Inhale 2 puffs into the lungs every 6 (six) hours as needed for wheezing or shortness of breath.   allopurinol (ZYLOPRIM) 100 MG tablet TAKE 1 TABLET BY MOUTH EVERY DAY   ALPRAZolam (XANAX) 0.25 MG tablet TAKE 1 TABLET BY MOUTH AT BEDTIME AS NEEDED FOR ANXIETY   aspirin EC 81 MG tablet Take 81 mg by mouth every morning.    atorvastatin (LIPITOR) 40 MG tablet TAKE 1 TABLET ONCE A DAY FOR CHOLESTEROL   diclofenac Sodium (VOLTAREN) 1 % GEL Apply 4 g topically 4 (four) times daily. To affected joint.   ergocalciferol (VITAMIN D2) 1.25 MG (50000 UT) capsule Take 1 capsule (50,000 Units total) by mouth every Friday.   fluticasone (FLONASE) 50 MCG/ACT nasal spray Place 2 sprays into both nostrils daily.   fluticasone furoate-vilanterol (BREO ELLIPTA) 200-25 MCG/ACT AEPB Inhale 1 puff into the lungs daily.   gabapentin (NEURONTIN) 100 MG capsule TAKE 1 CAPSULE BY MOUTH TWICE A DAY   hydrALAZINE (APRESOLINE) 25 MG tablet Take 1 tablet (25 mg total) by mouth 2 (two) times daily.   insulin aspart (NOVOLOG) 100 UNIT/ML injection Inject 12 Units into the skin 3 (three) times daily with meals.   insulin glargine (LANTUS) 100 UNIT/ML injection Inject 0.25 mLs (25 Units total) into the skin at bedtime.   ipratropium-albuterol (DUONEB) 0.5-2.5 (3) MG/3ML SOLN Take 3 mLs by nebulization every 6 (six) hours as needed.   LACTASE PO Take 1 tablet by mouth every morning.   metoprolol tartrate (LOPRESSOR) 25 MG tablet TAKE 1 TABLET BY MOUTH TWICE A DAY   Multiple Vitamin (MULTIVITAMIN WITH MINERALS) TABS tablet Take 1 tablet by mouth every morning.   ONE TOUCH ULTRA TEST test strip 1 each by Other route as  directed.   OXYGEN Inhale 3 L into the lungs continuous.   Polyethyl Glycol-Propyl Glycol 0.4-0.3 % SOLN Place 2 drops into both eyes daily as needed (dry eyes). Systane   Probiotic Product (PROBIOTIC DAILY PO) Take 1 tablet by mouth every morning.   [DISCONTINUED] spironolactone (ALDACTONE) 25 MG tablet Take 1 tablet (25 mg total) by mouth daily.   [DISCONTINUED] torsemide (DEMADEX) 10 MG tablet Take 3 tablets (30 mg total) by mouth daily.     Allergies:   Atenolol, Chlorhexidine, Codeine, Lactose intolerance (gi), Lodine [etodolac], Sulfa drugs cross reactors, Sulfamethoxazole, Benzonatate, Escitalopram oxalate, Metformin, Oxycodone-acetaminophen, and Pregabalin   Social History   Socioeconomic History   Marital status: Widowed    Spouse name: Not on file   Number of children: Not on file   Years of education: Not on file   Highest education level: Not on file  Occupational History   Not  on file  Tobacco Use   Smoking status: Never   Smokeless tobacco: Never  Substance and Sexual Activity   Alcohol use: No   Drug use: No   Sexual activity: Not Currently  Other Topics Concern   Not on file  Social History Narrative   Not on file   Social Determinants of Health   Financial Resource Strain: Low Risk  (06/21/2021)   Overall Financial Resource Strain (CARDIA)    Difficulty of Paying Living Expenses: Not hard at all  Food Insecurity: No Food Insecurity (06/21/2021)   Hunger Vital Sign    Worried About Running Out of Food in the Last Year: Never true    Ran Out of Food in the Last Year: Never true  Transportation Needs: No Transportation Needs (06/21/2021)   PRAPARE - Hydrologist (Medical): No    Lack of Transportation (Non-Medical): No  Physical Activity: Insufficiently Active (06/21/2021)   Exercise Vital Sign    Days of Exercise per Week: 5 days    Minutes of Exercise per Session: 10 min  Stress: No Stress Concern Present (06/21/2021)   [place]    Feeling of Stress : Not at all  Social Connections: Socially Isolated (06/21/2021)   Social Connection and Isolation Panel [NHANES]    Frequency of Communication with Friends and Family: More than three times a week    Frequency of Social Gatherings with Friends and Family: More than three times a week    Attends Religious Services: Never    Marine scientist or Organizations: No    Attends Music therapist: Never    Marital Status: Never married     Family History: The patient's family history includes Angina in her mother; Coronary artery disease in an other family member; Heart attack in her father; Hypertension in her father; Stroke in her mother.  ROS:   Please see the history of present illness.  All other systems reviewed and are negative.  Labs/Other Studies Reviewed:    The following studies were reviewed today:  Echo 05/14/22  1. Left ventricular ejection fraction, by estimation, is 60 to 65%. The  left ventricle has normal function. The left ventricle has no regional  wall motion abnormalities. There is mild concentric left ventricular  hypertrophy. Left ventricular diastolic  parameters are consistent with Grade II diastolic dysfunction  (pseudonormalization). Elevated left atrial pressure.   2. Right ventricular systolic function is normal. The right ventricular  size is normal. Tricuspid regurgitation signal is inadequate for assessing  PA pressure.   3. Left atrial size was moderately dilated.   4. The mitral valve is degenerative. Trivial mitral valve regurgitation.  No evidence of mitral stenosis. Moderate mitral annular calcification.   5. The aortic valve is tricuspid. There is mild calcification of the  aortic valve. There is mild thickening of the aortic valve. Aortic valve  regurgitation is not visualized. Aortic valve sclerosis/calcification is  present, without any  evidence of  aortic stenosis.   6. The inferior vena cava is normal in size with greater than 50%  respiratory variability, suggesting right atrial pressure of 3 mmHg.   Comparison(s): Prior images reviewed side by side. There is evidence of  high mean left atrial pressure, based on E/e' ratio.    Recent Labs: 05/16/2022: TSH 0.918 06/13/2022: ALT 18; B Natriuretic Peptide 732.5 06/18/2022: BUN 41; Creatinine, Ser 1.57; Hemoglobin 9.5; Magnesium 2.2; Platelets 207;  Potassium 4.2; Sodium 137  Recent Lipid Panel    Component Value Date/Time   CHOL 156 11/12/2021 1355   TRIG 141 11/12/2021 1355   HDL 47 11/12/2021 1355   CHOLHDL 3.3 11/12/2021 1355   CHOLHDL 3.1 04/21/2009 0620   VLDL 19 04/21/2009 0620   LDLCALC 84 11/12/2021 1355     Risk Assessment/Calculations:      Physical Exam:    VS:  BP 126/62   Pulse 70   Ht '5\' 4"'$  (1.626 m)   Wt 203 lb 3.2 oz (92.2 kg)   SpO2 99%   BMI 34.88 kg/m     Wt Readings from Last 3 Encounters:  06/25/22 203 lb 3.2 oz (92.2 kg)  06/17/22 201 lb 1 oz (91.2 kg)  06/04/22 206 lb 12.8 oz (93.8 kg)     GEN: Chronically ill appearing in no acute distress HEENT: Normal NECK: No JVD; No carotid bruits CARDIAC: RRR, no murmurs, rubs, gallops RESPIRATORY:  Clear to auscultation without rales, wheezing or rhonchi  ABDOMEN: Soft, non-tender, obese MUSCULOSKELETAL:  No edema; No deformity. 2+ pedal pulses, equal bilaterally SKIN: Warm and dry NEUROLOGIC:  Alert and oriented x 3 PSYCHIATRIC:  Normal affect   EKG:  EKG is not ordered today.    Diagnoses:    1. Chronic heart failure with preserved ejection fraction (HFpEF) (HCC)   2. Stage 3b chronic kidney disease ([place])   3. Hypertension, essential   4. Hyperlipidemia LDL goal <70   5. Aortic valve stenosis, etiology of cardiac valve disease unspecified   6. Coronary artery calcification seen on CT scan    Assessment and Plan:     Chronic HFpEF: LVEF 60 to 65%, G2 DD on echo  05/14/2022. No evidence of volume overload today although body habitus makes it challenging to assess. She denies dyspnea, orthopnea, PND, edema. No increase in weight at home but not monitoring daily. Encouraged daily weights at same time each morning and to call if weight increases > 2 lbs in 24 hours or > 5 lbs in one week. Continue to limit fluids and sodium.  Is tolerating torsemide and spironolactone, new since hospitalization. Brisk urine output with torsemide. We will check bmet today.   Hypertension: BP is well-controlled. Does not monitor at home. Encouraged patient's son to get a home BP cuff and monitor on a consistent basis. Guidelines for appropriate BP monitoring were given. No medication changes today.   Aortic stenosis: Mild calcification of aortic valve, mild thickening. Aortic valve calcificaiton present without evidence of aortic stenosis. No chest pain, presyncope, or syncope. I do not appreciate a significant murmur on exam. We will continue to monitor with echo on a yearly basis, sooner if clinically indicated.   Hyperlipidemia/Coronary artery calcification on CT: LDL 84 on 11/12/21. Not discussed today. Would favor recheck lipid panel at next office visit.  Continue atorvastatin.  CKD: Son states creatinine has been elevated for many years. States he feels that kidney function is stable on current medication therapy. Will recheck bmet today.   Disposition: 3 months with Dr. Acie Fredrickson or APP   Medication Adjustments/Labs and Tests Ordered: Current medicines are reviewed at length with the patient today.  Concerns regarding medicines are outlined above.  Orders Placed This Encounter  Procedures   Basic Metabolic Panel (BMET)   Meds ordered this encounter  Medications   torsemide (DEMADEX) 10 MG tablet    Sig: Take 3 tablets (30 mg total) by mouth daily.    Dispense:  90  tablet    Refill:  3   spironolactone (ALDACTONE) 25 MG tablet    Sig: Take 1 tablet (25 mg total) by  mouth daily.    Dispense:  90 tablet    Refill:  3    Patient Instructions  Medication Instructions:  Your physician recommends that you continue on your current medications as directed. Please refer to the Current Medication list given to you today.  *If you need a refill on your cardiac medications before your next appointment, please call your pharmacy*   Lab Work: TODAY:  BMET  If you have labs (blood work) drawn today and your tests are completely normal, you will receive your results only by: [place] (if you have MyChart) OR A paper copy in the mail If you have any lab test that is abnormal or we need to change your treatment, we will call you to review the results.   Testing/Procedures: None ordered   Follow-Up: At [facility], you and your health needs are our priority.  As part of our continuing mission to provide you with exceptional heart care, we have created designated Provider Care Teams.  These Care Teams include your primary Cardiologist (physician) and Advanced Practice Providers (APPs -  Physician Assistants and Nurse Practitioners) who all work together to provide you with the care you need, when you need it.  We recommend signing up for the patient portal called "MyChart".  Sign up information is provided on this After Visit Summary.  MyChart is used to connect with patients for Virtual Visits (Telemedicine).  Patients are able to view lab/test results, encounter notes, upcoming appointments, etc.  Non-urgent messages can be sent to your provider as well.   To learn more about what you can do with MyChart, go to [email or web address].    Your next appointment:   3 month(s)  09/23/22 3:20  The format for your next appointment:   In Person  Provider:   Mertie Moores, MD     Other Instructions Your physician has requested that you regularly monitor and record your blood pressure readings at home. Please use the same machine at the same time of  day to check your readings and record them to bring to your follow-up visit.   Please monitor blood pressures and keep a log of your readings.    Make sure to check 2 hours after your medications.    AVOID these things for 30 minutes before checking your blood pressure: No Drinking caffeine. No Drinking alcohol. No Eating. No Smoking. No Exercising.   Five minutes before checking your blood pressure: Pee. Sit in a dining chair. Avoid sitting in a soft couch or armchair. Be quiet. Do not talk   Important Information About Sugar         Signed, Emmaline Life, NP  06/25/2022 4:22 PM    [place]

## 2022-06-24 ENCOUNTER — Telehealth: Payer: Self-pay | Admitting: Internal Medicine

## 2022-06-24 ENCOUNTER — Ambulatory Visit: Payer: Medicare Other | Admitting: Nurse Practitioner

## 2022-06-24 ENCOUNTER — Telehealth: Payer: Self-pay

## 2022-06-24 NOTE — Telephone Encounter (Signed)
Patient's son [name] left a voicemail over the weekend requesting to cancel Palliative Care consult. He states he will follow up in the future right now is not a good time. I will cancel appointment and referral. Referring provider notified.

## 2022-06-24 NOTE — Telephone Encounter (Signed)
LVM for pt to rtn my call to schedule AWV with [facility] call back # [phone] 

## 2022-06-25 ENCOUNTER — Ambulatory Visit (INDEPENDENT_AMBULATORY_CARE_PROVIDER_SITE_OTHER): Payer: Medicare Other | Admitting: Nurse Practitioner

## 2022-06-25 ENCOUNTER — Encounter: Payer: Self-pay | Admitting: Nurse Practitioner

## 2022-06-25 VITALS — BP 126/62 | HR 70 | Ht 64.0 in | Wt 203.2 lb

## 2022-06-25 DIAGNOSIS — I10 Essential (primary) hypertension: Secondary | ICD-10-CM | POA: Diagnosis not present

## 2022-06-25 DIAGNOSIS — E785 Hyperlipidemia, unspecified: Secondary | ICD-10-CM

## 2022-06-25 DIAGNOSIS — I350 Nonrheumatic aortic (valve) stenosis: Secondary | ICD-10-CM | POA: Diagnosis not present

## 2022-06-25 DIAGNOSIS — I5032 Chronic diastolic (congestive) heart failure: Secondary | ICD-10-CM | POA: Diagnosis not present

## 2022-06-25 DIAGNOSIS — N1832 Chronic kidney disease, stage 3b: Secondary | ICD-10-CM

## 2022-06-25 DIAGNOSIS — I2510 Atherosclerotic heart disease of native coronary artery without angina pectoris: Secondary | ICD-10-CM | POA: Diagnosis not present

## 2022-06-25 MED ORDER — SPIRONOLACTONE 25 MG PO TABS: 25.0000 mg | ORAL_TABLET | Freq: Every day | ORAL | 3 refills | Status: DC

## 2022-06-25 MED ORDER — TORSEMIDE 10 MG PO TABS: 30.0000 mg | ORAL_TABLET | Freq: Every day | ORAL | 3 refills | Status: DC

## 2022-06-25 NOTE — Patient Instructions (Addendum)
Medication Instructions:  Your physician recommends that you continue on your current medications as directed. Please refer to the Current Medication list given to you today.  *If you need a refill on your cardiac medications before your next appointment, please call your pharmacy*   Lab Work: TODAY:  BMET  If you have labs (blood work) drawn today and your tests are completely normal, you will receive your results only by: [place] (if you have MyChart) OR A paper copy in the mail If you have any lab test that is abnormal or we need to change your treatment, we will call you to review the results.   Testing/Procedures: None ordered   Follow-Up: At [facility], you and your health needs are our priority.  As part of our continuing mission to provide you with exceptional heart care, we have created designated Provider Care Teams.  These Care Teams include your primary Cardiologist (physician) and Advanced Practice Providers (APPs -  Physician Assistants and Nurse Practitioners) who all work together to provide you with the care you need, when you need it.  We recommend signing up for the patient portal called "MyChart".  Sign up information is provided on this After Visit Summary.  MyChart is used to connect with patients for Virtual Visits (Telemedicine).  Patients are able to view lab/test results, encounter notes, upcoming appointments, etc.  Non-urgent messages can be sent to your provider as well.   To learn more about what you can do with MyChart, go to [email or web address].    Your next appointment:   3 month(s)  09/23/22 3:20  The format for your next appointment:   In Person  Provider:   Mertie Moores, MD     Other Instructions Your physician has requested that you regularly monitor and record your blood pressure readings at home. Please use the same machine at the same time of day to check your readings and record them to bring to your follow-up visit.    Please monitor blood pressures and keep a log of your readings.    Make sure to check 2 hours after your medications.    AVOID these things for 30 minutes before checking your blood pressure: No Drinking caffeine. No Drinking alcohol. No Eating. No Smoking. No Exercising.   Five minutes before checking your blood pressure: Pee. Sit in a dining chair. Avoid sitting in a soft couch or armchair. Be quiet. Do not talk   Important Information About Sugar

## 2022-06-26 LAB — BASIC METABOLIC PANEL
BUN/Creatinine Ratio: 32 — ABNORMAL HIGH (ref 12–28)
BUN: 51 mg/dL — ABNORMAL HIGH (ref 8–27)
CO2: 26 mmol/L (ref 20–29)
Calcium: 9.4 mg/dL (ref 8.7–10.3)
Chloride: 97 mmol/L (ref 96–106)
Creatinine, Ser: 1.57 mg/dL — ABNORMAL HIGH (ref 0.57–1.00)
Glucose: 180 mg/dL — ABNORMAL HIGH (ref 70–99)
Potassium: 4.8 mmol/L (ref 3.5–5.2)
Sodium: 137 mmol/L (ref 134–144)
eGFR: 32 mL/min/{1.73_m2} — ABNORMAL LOW (ref >59)

## 2022-06-28 DIAGNOSIS — I10 Essential (primary) hypertension: Secondary | ICD-10-CM | POA: Diagnosis not present

## 2022-06-28 DIAGNOSIS — Z794 Long term (current) use of insulin: Secondary | ICD-10-CM | POA: Diagnosis not present

## 2022-06-28 DIAGNOSIS — E1165 Type 2 diabetes mellitus with hyperglycemia: Secondary | ICD-10-CM | POA: Diagnosis not present

## 2022-06-28 DIAGNOSIS — E782 Mixed hyperlipidemia: Secondary | ICD-10-CM | POA: Diagnosis not present

## 2022-07-01 ENCOUNTER — Telehealth: Payer: Self-pay | Admitting: Internal Medicine

## 2022-07-02 ENCOUNTER — Telehealth: Payer: Self-pay | Admitting: Cardiovascular Disease

## 2022-07-02 NOTE — Telephone Encounter (Signed)
Pt c/o medication issue:  1. Name of Medication: spironolactone (ALDACTONE) 25 MG tablet  2. How are you currently taking this medication (dosage and times per day)? As prescribed   3. Are you having a reaction (difficulty breathing--STAT)? Yes  4. What is your medication issue? Pt son states that her glucose readings are staying over 200 and currently 529. He believes it may be due to this medication and would like to speak to someone about this and possibly discontinue it. Please advise.

## 2022-07-03 NOTE — Telephone Encounter (Signed)
It has been reported but it is very rare. With an increase of 200 to 529 I'm more suspicious it is due to diet than spironolactone.

## 2022-07-03 NOTE — Telephone Encounter (Signed)
Has he been monitoring BP at home? Would not restart amlodipine unless BP is elevated on a consistent basis  > 140 or > 80 on a consistent basis  Let us know about BP readings. Also, let us know if she has symptoms or fluid overload including SOB, weight gain, or difficulty lying flat prior to appointment with Dr. Acie Fredrickson in Nov.

## 2022-07-03 NOTE — Telephone Encounter (Signed)
Called patient's son back about message. He stated that spirolactone has caused the patient's blood glucose to sky rocket into the 500's. They have the blood glucose back under control with having to give more insulin. He stated they could not keep doing this. He stated they have stop the spirolactone, and plus after reading studies about the drug they feel like she is not a good candidate due to her kidney disease. Spirolactone was started after discharge from the hospital and he stated that this is the only change that was made and he knows it is causing the blood glucose to be elevated. Patient's son [name] had two questions.  1-Should patient go back on amlodipine? He stated the spirolactone was added and the amlodipine was taken off.   2-If not, does she need to be on something else? Will torsemide be enough? Patient's edema has improved with elevating legs and wearing compression stockings.

## 2022-07-03 NOTE — Telephone Encounter (Signed)
Sent mychart message with Michelle's recommendations.

## 2022-07-04 ENCOUNTER — Other Ambulatory Visit: Payer: Self-pay | Admitting: Nurse Practitioner

## 2022-07-04 NOTE — Telephone Encounter (Signed)
Hyperglycemia is listed as a possible side effect of spironolactone however it is only mentioned casually in the databases.  Hyperglycemia a) Has been reported [1]  It does not give a percentage of patients or list case studies which leads me to believe it is extremely rare.  A Pubmed search did not find any relevant articles associating spironolactone and hyperglycemia either. In fact, the most relevant article I could find showed that spironolactone actually lowered glucose levels in diabetic rats.  https://waters.com/  Unlikely such profound hyperglycemia would be caused by spironolactone.

## 2022-07-04 NOTE — Telephone Encounter (Signed)
Nahser, Wonda Cheng, MD  You 4 hours ago (12:53 PM)    Please have the patient's son monitor and record her BP daily   Please inform him of the following :   I agree with Sharyn Lull,  would have her add Amlodipine for SBP > 140.   Amlodipine can contribute to leg edema which is why we are trying to avoid daily amlodipine   I have not seen glucose elevation with spironolactone - I'm not sure if the spironolactone is the cause .   Spironolactone is a standard medication for CHF even if the patient has some degree of renal insufficiency.  We routinely check renal function regularly after starting spironolactone and similar medications    Thanks    PN    Called and spoke with patient's son who was very unhappy with the above recommendations. He states that he understands that spironolactone does elevate glucose after reviewing multiple studies from [facility] clinic and other sources. He states that he understands Spironolactone is commonly prescribed and may be golden standard for a younger patient, but is not fit for "an 88 year old." States he worked for the [employer] for 15 years and all the clinical trials for spironolactone say that it's possible, but rare and all state that "further research must be done." He states that he didn't give his mom the spironolactone last night and her glucose was below 200 for the first time since leaving the hospital. She is also using albuterol nebulizer as needed, but this runs her sugar up as well. He states he will hold both medications and is willing to add back in if they can rule out glucose. He is very methodical about his mother's care. He is open to the fact that it could be something else raising her sugar, but he is going to try to rule out by process of elimination. He does want her to be on Spironolactone if it's best for her, but he feels that the immediate danger is her kidneys, which he state she is on the brink of failure. He also feels that this drug has a  lot of interactions, which concerns him. He is working also with the endocrinologist, which he feels is more helpful in this matter. He will let us know, based on his findings, if hyperglycemia is induced by spironolactone use.

## 2022-09-18 ENCOUNTER — Other Ambulatory Visit: Payer: Self-pay | Admitting: Nurse Practitioner

## 2022-09-18 ENCOUNTER — Other Ambulatory Visit: Payer: Self-pay | Admitting: Cardiovascular Disease

## 2022-09-18 ENCOUNTER — Other Ambulatory Visit: Payer: Self-pay | Admitting: Internal Medicine

## 2022-09-22 ENCOUNTER — Encounter: Payer: Self-pay | Admitting: Cardiovascular Disease

## 2022-09-22 NOTE — Progress Notes (Signed)
Cardiology Office Note   Date:  09/23/2022   ID:  [name], DOB 05-13-1931, MRN [id number]  PCP:  Hoyt Koch, MD  Cardiologist: Darlin Coco MD, now [place]  Chief Complaint  Patient presents with   Congestive Heart Failure        Problem list 1. Chronic diastolic congestive heart failure 2. History of pericardial effusion-status post pericardial window 3.  Aortic stenosis 4. Diabetes mellitus 5. Coronary artery disease 6. Essential hypertension 7. Hyperlipidemia   Notes from [place] - Feb. 2017:  [name] is a 88 y.o. female who presents for a six-month follow-up visit   She has a past history of diastolic congestive heart failure. Her last echocardiogram on 02/10/12 showed an ejection fraction of 60% with normal systolic function and grade 1 diastolic dysfunction. She has a past history of pericardial effusion with pericardial tamponade and in 2010 and underwen a pericardial window with good results. she has a history of mild aortic stenosis. Since we last saw her she underwent left hip replacement by Dr. Mayer Camel and has done well.   Her last echocardiogram was 02/10/12 and showed an ejection fraction of 60%. Since last visit she has been feeling well. Since last visit her husband has been diagnosed with early Alzheimer's but fortunately he seems to be responding nicely to Aricept. Her last visit we gave her a trial off gabapentin for peripheral neuropathy. Initially she did not think that it helped but now she thinks that it has helped and she would like to try a stronger dose.  We will increase the dose to 600 mg each evening. She reports that she is only taking 80 mg of Lasix daily and this has been sufficient to keep her edema at a minimum. He has not been having any recent chest pain.  She is not having any increase in peripheral edema on the lower dose of Lasix.  Her breathing is improved.  Oct. 18, 2017: Still some dyspnea. Does not get any regular   exercise Lots of back and leg pain   Jan. 24, 2018:  Was admitted to the hospital in Dec for shortness of breath .  Was found to have Grade 2 diastolic dysfunction  Was found to have CKD - Diovan was stopped.  BP has been elevated since that time   Aug. 28, 2018:  [name] is seen today .    She does have DOE.  Does not exercise.  Has chronic diastolic CHF  February 05, 9322:  [name]  is seen today for follow-up of her chronic diastolic congestive heart failure and hypertension. Feeling well  Husband died 2 weeks ago .  [name]) ,  Had been in a SNF for 6 months  Walks with walker.  Has lost some weight .  Wt. is 201 lbs today   September 03, 2018: Clear is seen today for follow-up visit.  She has a history of chronic diastolic congestive heart failure.  She has a history of hypertension.  Overall , seems to be doing well   Jan. 20, 2021:  [name] is seen today for follow up of her chronic diastolic congestive heart failure, hypertension, hyperlipidemia. She  [place] in Nov, 2020,  Was in the hospital    Was found to have a transverse type II fracture of the odontoid process.  Was not a candidate for surgical intervention .  Still recovering from the fall.   Legs stay swollen - likely due to  her inactivity   July 20 ,2021:  [name] is seen today with son, [name].   Seems to be stable,   Has recurrent bladden infections .  typically e.coli  Her son now recognizes the subtile symptoms and she is able to get treated earlier .  Has chronic leg edema.  Has tried to elevate her legs but is not able to do this regularly   Legs are better in the am.   She uses oxygen at nght now since last hospitalization   Jan. 9, 2023 [name] is seen today with son, [name] Has chronic diastolic CHF Is on home O2 - 2 LPM Walks to the bathroom around the house Does not walk much  Has had multple falls   Just treated for URI and wheezing  Just tapered off of prednisone  Is having frequent  PVCs today on ECG  Still eating some salt - on lasix 80 mg a day  Bmp today     Nov. 20, 2023 [place] is seen today with son, [name].   [name] is seen today for follow up of her chronic diastolic CHF Hx of multiple falls . Examined in wheelchair  Uses NIV ( noninvasive ventilator ) , periodically through the day ,  similar to BIPAP   Was in the hospital in Aug. 2023  Tried Sprironolactone  - did not tolerate it due to hyperglycemia  Is doing better on the torsemide   Will try Eplerenone 25 mg a day  Reduce torsemide to 30 mg every other day   BMP in 3 weeks    Past Medical History:  Diagnosis Date   Anasarca 04/2009   found to be secondary to pericardial effusion with tamponade/pericardial window done   Chronic diastolic CHF (congestive heart failure) (HCC)    CKD (chronic kidney disease), stage III ([place])    COPD (chronic obstructive pulmonary disease) ([place])    Coronary artery disease    a. mild-mod CAD 2004.   Diabetes mellitus    insulin dependent   Herpes labialis    Healing herpes labialis   Hypercholesterolemia    Hypertension    Lumbar radiculopathy    Lumbar scoliosis    Morbid obesity ([place])    Pericardial effusion 04/2009   a. 2010 with tamponade s/p window.   PONV (postoperative nausea and vomiting)    has not had any problems since 2001   Spondylolisthesis    Spondylosis     Past Surgical History:  Procedure Laterality Date   BACK SURGERY     microdiskectomy L3-4 on L/partial facetectomy 3-4 on L/removal of synovial cyst on left L3-4   BREAST BIOPSY     left   CARDIAC CATHETERIZATION  04/25/2003   L heart cath w/coronary angiography/R femoral artery/ EF 65%/no mitral regurgitation/ angiography L main coronary artery smooth & normal   EYE SURGERY     ophthalmoscopy/peritomy adjacent to the limbus from the 8 to 2:30 position superiorly   lumbar laminectomy     SUBXYPHOID PERICARDIAL WINDOW  04/2009   for pericardial effusion w/pericardial  tamponade/sac drained w/20-French Baard drain/transesophageal echocardiogram confimed complete evacution of pericardial fluid   TOTAL HIP ARTHROPLASTY  03/06/2012   Procedure: TOTAL HIP ARTHROPLASTY;  Surgeon: Kerin Salen, MD;  Location: [facility];  Service: Orthopedics;  Laterality: Left;  DEPUY/PINNACLE, SUMMIT STEM, CUP POLY & CERAMIC   VAGINAL HYSTERECTOMY       Current Outpatient Medications  Medication Sig Dispense Refill   acetaminophen (TYLENOL) 500 MG tablet Take  500 mg by mouth 2 (two) times daily as needed for mild pain or headache.     albuterol (VENTOLIN HFA) 108 (90 Base) MCG/ACT inhaler Inhale 2 puffs into the lungs every 6 (six) hours as needed for wheezing or shortness of breath. 8 g 3   allopurinol (ZYLOPRIM) 100 MG tablet TAKE 1 TABLET BY MOUTH EVERY DAY 90 tablet 3   ALPRAZolam (XANAX) 0.25 MG tablet TAKE 1 TABLET BY MOUTH AT BEDTIME AS NEEDED FOR ANXIETY 30 tablet 3   aspirin EC 81 MG tablet Take 81 mg by mouth every morning.      atorvastatin (LIPITOR) 40 MG tablet TAKE 1 TABLET ONCE A DAY FOR CHOLESTEROL 90 tablet 3   diclofenac Sodium (VOLTAREN) 1 % GEL Apply 4 g topically 4 (four) times daily. To affected joint. 100 g 11   eplerenone (INSPRA) 25 MG tablet Take 1 tablet (25 mg total) by mouth daily. 90 tablet 3   ergocalciferol (VITAMIN D2) 1.25 MG (50000 UT) capsule Take 1 capsule (50,000 Units total) by mouth every Friday. 12 capsule 3   fluticasone (FLONASE) 50 MCG/ACT nasal spray Place 2 sprays into both nostrils daily. 16 g 1   fluticasone furoate-vilanterol (BREO ELLIPTA) 200-25 MCG/ACT AEPB Inhale 1 puff into the lungs daily. 1 each 11   gabapentin (NEURONTIN) 100 MG capsule TAKE 1 CAPSULE BY MOUTH TWICE A DAY 180 capsule 1   hydrALAZINE (APRESOLINE) 25 MG tablet Take 1 tablet (25 mg total) by mouth 2 (two) times daily. 180 tablet 3   insulin aspart (NOVOLOG) 100 UNIT/ML injection Inject 12-18 Units into the skin 3 (three) times daily with meals.     insulin glargine  (LANTUS) 100 UNIT/ML injection Inject 0.25 mLs (25 Units total) into the skin at bedtime. (Patient taking differently: Inject 30 Units into the skin at bedtime.) 10 mL 1   ipratropium-albuterol (DUONEB) 0.5-2.5 (3) MG/3ML SOLN Take 3 mLs by nebulization every 6 (six) hours as needed. 360 mL 0   LACTASE PO Take 1 tablet by mouth every morning.     metoprolol tartrate (LOPRESSOR) 25 MG tablet TAKE 1 TABLET BY MOUTH TWICE A DAY 180 tablet 3   Multiple Vitamin (MULTIVITAMIN WITH MINERALS) TABS tablet Take 1 tablet by mouth every morning.     ONE TOUCH ULTRA TEST test strip 1 each by Other route as directed.     OXYGEN Inhale 3 L into the lungs continuous.     Polyethyl Glycol-Propyl Glycol 0.4-0.3 % SOLN Place 2 drops into both eyes daily as needed (dry eyes). Systane     Probiotic Product (PROBIOTIC DAILY PO) Take 1 tablet by mouth every morning.     torsemide (DEMADEX) 10 MG tablet Take 3 tablets ('30mg'$ ) by mouth every other day 135 tablet 3   fluticasone furoate-vilanterol (BREO ELLIPTA) 100-25 MCG/ACT AEPB INHALE 1 PUFF BY MOUTH EVERY DAY (Patient not taking: Reported on 09/23/2022) 60 each 11   No current facility-administered medications for this visit.    Allergies:   Atenolol, Chlorhexidine, Codeine, Lactose intolerance (gi), Lodine [etodolac], Sulfa drugs cross reactors, Sulfamethoxazole, Benzonatate, Escitalopram oxalate, Metformin, Oxycodone-acetaminophen, and Pregabalin    Social History:  The patient  reports that she has never smoked. She has never used smokeless tobacco. She reports that she does not drink alcohol and does not use drugs.   Family History:  The patient's family history includes Angina in her mother; Coronary artery disease in an other family member; Heart attack in her father; Hypertension in her  father; Stroke in her mother.    ROS:  Please see the history of present illness.   Otherwise, review of systems are positive for none.   All other systems are reviewed and  negative.     Physical Exam: Blood pressure 116/76, pulse 78, height '5\' 4"'$  (1.626 m), weight 190 lb 12.8 oz (86.5 kg), SpO2 98 %.       GEN:  elderly , moderately obese female in no acute distress HEENT: Normal NECK: No JVD; No carotid bruits LYMPHATICS: No lymphadenopathy CARDIAC: RRR , no murmurs, rubs, gallops RESPIRATORY:  Clear to auscultation without rales, wheezing or rhonchi  ABDOMEN: Soft, non-tender, non-distended MUSCULOSKELETAL:  No edema; No deformity  SKIN: Warm and dry NEUROLOGIC:  Alert and oriented x 3    EKG:       Recent Labs: 05/16/2022: TSH 0.918 06/13/2022: ALT 18; B Natriuretic Peptide 732.5 06/18/2022: Hemoglobin 9.5; Magnesium 2.2; Platelets 207 06/25/2022: BUN 51; Creatinine, Ser 1.57; Potassium 4.8; Sodium 137    Lipid Panel    Component Value Date/Time   CHOL 156 11/12/2021 1355   TRIG 141 11/12/2021 1355   HDL 47 11/12/2021 1355   CHOLHDL 3.3 11/12/2021 1355   CHOLHDL 3.1 04/21/2009 0620   VLDL 19 04/21/2009 0620   LDLCALC 84 11/12/2021 1355    Wt Readings from Last 3 Encounters:  09/23/22 190 lb 12.8 oz (86.5 kg)  06/25/22 203 lb 3.2 oz (92.2 kg)  06/17/22 201 lb 1 oz (91.2 kg)     ASSESSMENT AND PLAN:  1.  Chronic diastolic heart failure    -she is doing overall fairly well.  She is tolerating the torsemide and achieving a better diuresis with the torsemide.  She was tried on spironolactone but this caused significant hyperglycemia.  We will try her on eplerenone 25 mg a day to see if that works any better.  We will reduce her torsemide to 30 mg every other day when we add eplerenone.  If she does not tolerate the eplerenone then she is to go back on the torsemide 30 mg every day.  I will see her again in 3 months for follow-up visit.     2.  History of pericarditis:       3. diabetes mellitus with diabetic neuropathy:    4. essential hypertension:          Current medicines are reviewed at length with the patient today.   The patient does not have concerns regarding medicines.    Labs/ tests ordered today include:   Orders Placed This Encounter  Procedures   Basic metabolic panel   Basic metabolic panel     Mertie Moores, MD  09/23/2022 5:56 PM    [address] [place],  [address] [place], [place]  [address] Pager [phone] Phone: [phone]; Fax: [phone]

## 2022-09-23 ENCOUNTER — Ambulatory Visit: Payer: Medicare Other | Attending: Cardiovascular Disease | Admitting: Cardiovascular Disease

## 2022-09-23 ENCOUNTER — Encounter: Payer: Self-pay | Admitting: Cardiovascular Disease

## 2022-09-23 VITALS — BP 116/76 | HR 78 | Ht 64.0 in | Wt 190.8 lb

## 2022-09-23 DIAGNOSIS — I2510 Atherosclerotic heart disease of native coronary artery without angina pectoris: Secondary | ICD-10-CM

## 2022-09-23 DIAGNOSIS — Z79899 Other long term (current) drug therapy: Secondary | ICD-10-CM | POA: Diagnosis not present

## 2022-09-23 DIAGNOSIS — E785 Hyperlipidemia, unspecified: Secondary | ICD-10-CM | POA: Insufficient documentation

## 2022-09-23 DIAGNOSIS — I5032 Chronic diastolic (congestive) heart failure: Secondary | ICD-10-CM | POA: Diagnosis not present

## 2022-09-23 DIAGNOSIS — Z23 Encounter for immunization: Secondary | ICD-10-CM | POA: Diagnosis not present

## 2022-09-23 DIAGNOSIS — N1832 Chronic kidney disease, stage 3b: Secondary | ICD-10-CM | POA: Insufficient documentation

## 2022-09-23 DIAGNOSIS — I10 Essential (primary) hypertension: Secondary | ICD-10-CM | POA: Diagnosis not present

## 2022-09-23 MED ORDER — TORSEMIDE 10 MG PO TABS: ORAL_TABLET | ORAL | 3 refills | Status: DC

## 2022-09-23 MED ORDER — EPLERENONE 25 MG PO TABS: 25.0000 mg | ORAL_TABLET | Freq: Every day | ORAL | 3 refills | Status: DC

## 2022-09-23 NOTE — Patient Instructions (Signed)
Medication Instructions:  DECREASE Torsemide to '30mg'$  every OTHER day START Eplerenone '25mg'$  daily *If you need a refill on your cardiac medications before your next appointment, please call your pharmacy*   Lab Work: BMET in 2-3 weeks BMET in 3 months (at next appt) If you have labs (blood work) drawn today and your tests are completely normal, you will receive your results only by: [place] (if you have MyChart) OR A paper copy in the mail If you have any lab test that is abnormal or we need to change your treatment, we will call you to review the results.   Testing/Procedures: NONE   Follow-Up: At [facility], you and your health needs are our priority.  As part of our continuing mission to provide you with exceptional heart care, we have created designated Provider Care Teams.  These Care Teams include your primary Cardiologist (physician) and Advanced Practice Providers (APPs -  Physician Assistants and Nurse Practitioners) who all work together to provide you with the care you need, when you need it.  Your next appointment:   3 month(s)  The format for your next appointment:   In Person  Provider:   Mertie Moores, MD       Important Information About Sugar

## 2022-10-04 ENCOUNTER — Encounter: Payer: Self-pay | Admitting: Internal Medicine

## 2022-10-04 ENCOUNTER — Ambulatory Visit (INDEPENDENT_AMBULATORY_CARE_PROVIDER_SITE_OTHER): Payer: Medicare Other | Admitting: Internal Medicine

## 2022-10-04 VITALS — BP 120/60 | HR 87 | Temp 98.3°F

## 2022-10-04 DIAGNOSIS — E1169 Type 2 diabetes mellitus with other specified complication: Secondary | ICD-10-CM

## 2022-10-04 DIAGNOSIS — Z794 Long term (current) use of insulin: Secondary | ICD-10-CM

## 2022-10-04 DIAGNOSIS — I2510 Atherosclerotic heart disease of native coronary artery without angina pectoris: Secondary | ICD-10-CM

## 2022-10-04 DIAGNOSIS — I272 Pulmonary hypertension, unspecified: Secondary | ICD-10-CM | POA: Diagnosis not present

## 2022-10-04 DIAGNOSIS — E1142 Type 2 diabetes mellitus with diabetic polyneuropathy: Secondary | ICD-10-CM

## 2022-10-04 DIAGNOSIS — D649 Anemia, unspecified: Secondary | ICD-10-CM

## 2022-10-04 DIAGNOSIS — E785 Hyperlipidemia, unspecified: Secondary | ICD-10-CM

## 2022-10-04 DIAGNOSIS — Z0000 Encounter for general adult medical examination without abnormal findings: Secondary | ICD-10-CM

## 2022-10-04 DIAGNOSIS — I10 Essential (primary) hypertension: Secondary | ICD-10-CM | POA: Diagnosis not present

## 2022-10-04 DIAGNOSIS — I5042 Chronic combined systolic (congestive) and diastolic (congestive) heart failure: Secondary | ICD-10-CM

## 2022-10-04 DIAGNOSIS — R300 Dysuria: Secondary | ICD-10-CM

## 2022-10-04 DIAGNOSIS — Z0001 Encounter for general adult medical examination with abnormal findings: Secondary | ICD-10-CM

## 2022-10-04 MED ORDER — NITROFURANTOIN MONOHYD MACRO 100 MG PO CAPS: 100.0000 mg | ORAL_CAPSULE | Freq: Every day | ORAL | 0 refills | Status: DC

## 2022-10-04 NOTE — Assessment & Plan Note (Signed)
Checking U/A and culture. She is taking macrobid 100 mg daily for prevention and will start BID for 1 week until culture returns.

## 2022-10-04 NOTE — Progress Notes (Signed)
Subjective:   Patient ID: [name], female    DOB: [birth year], 88 y.o.   MRN: [id number]  HPI Here for medicare wellness, no new complaints needs follow up on chronic illnesses. Please see A/P for status and treatment of chronic medical problems.   Diet: heart healthy/DM since diabetic Physical activity: sedentary Depression/mood screen: negative Hearing: moderate loss bilaterally Visual acuity: grossly normal with lens, performs annual eye exam  ADLs: capable Fall risk: low Home safety: good Cognitive evaluation: intact to orientation, naming, recall and repetition EOL planning: adv directives discussed, in place  [employer] Visit from 10/04/2022 in [facility] at [facility] Visit from 10/04/2022 in [place] at [facility]  PHQ-9 Total Score 0         06/16/2022    8:00 PM 06/17/2022    8:05 AM 06/17/2022    8:00 PM 06/18/2022    7:30 AM 10/04/2022    4:25 PM  [place] in the past year?     0  Was there an injury with Fall?     0  Fall Risk Category Calculator     0  Fall Risk Category     Low  Patient Fall Risk Level High fall risk High fall risk High fall risk High fall risk   Fall risk Follow up     Falls evaluation completed    I have personally reviewed and have noted 1. The patient's medical and social history - reviewed today no changes 2. Their use of alcohol, tobacco or illicit drugs 3. Their current medications and supplements 4. The patient's functional ability including ADL's, fall risks, home safety risks and hearing or visual impairment. 5. Diet and physical activities 6. Evidence for depression or mood disorders 7. Care team reviewed and updated 8.  The patient is not on an opioid pain medication.  Patient Care Team: Hoyt Koch, MD as PCP - General (Internal Medicine) Nahser, Wonda Cheng, MD as PCP - Cardiology (Cardiology) Feliz Beam, MD as Referring  Physician (Ophthalmology) Past Medical History:  Diagnosis Date   Anasarca 04/2009   found to be secondary to pericardial effusion with tamponade/pericardial window done   Chronic diastolic CHF (congestive heart failure) ([place])    CKD (chronic kidney disease), stage III ([place])    COPD (chronic obstructive pulmonary disease) ([place])    Coronary artery disease    a. mild-mod CAD 2004.   Diabetes mellitus    insulin dependent   Herpes labialis    Healing herpes labialis   Hypercholesterolemia    Hypertension    Lumbar radiculopathy    Lumbar scoliosis    Morbid obesity ([place])    Pericardial effusion 04/2009   a. 2010 with tamponade s/p window.   PONV (postoperative nausea and vomiting)    has not had any problems since 2001   Spondylolisthesis    Spondylosis    Past Surgical History:  Procedure Laterality Date   BACK SURGERY     microdiskectomy L3-4 on L/partial facetectomy 3-4 on L/removal of synovial cyst on left L3-4   BREAST BIOPSY     left   CARDIAC CATHETERIZATION  04/25/2003   L heart cath w/coronary angiography/R femoral artery/ EF 65%/no mitral regurgitation/ angiography L main coronary artery smooth & normal   EYE SURGERY     ophthalmoscopy/peritomy adjacent to the limbus from the 8 to 2:30 position superiorly  lumbar laminectomy     SUBXYPHOID PERICARDIAL WINDOW  04/2009   for pericardial effusion w/pericardial tamponade/sac drained w/20-French Baard drain/transesophageal echocardiogram confimed complete evacution of pericardial fluid   TOTAL HIP ARTHROPLASTY  03/06/2012   Procedure: TOTAL HIP ARTHROPLASTY;  Surgeon: Kerin Salen, MD;  Location: [facility];  Service: Orthopedics;  Laterality: Left;  DEPUY/PINNACLE, SUMMIT STEM, CUP POLY & CERAMIC   VAGINAL HYSTERECTOMY     Family History  Problem Relation Age of Onset   Heart attack Father    Hypertension Father    Stroke Mother    Angina Mother    Coronary artery disease Other    Review of Systems  Constitutional:   Positive for activity change.  HENT: Negative.    Eyes: Negative.   Respiratory:  Positive for shortness of breath. Negative for cough and chest tightness.   Cardiovascular:  Negative for chest pain, palpitations and leg swelling.  Gastrointestinal:  Negative for abdominal distention, abdominal pain, constipation, diarrhea, nausea and vomiting.  Musculoskeletal:  Positive for arthralgias and back pain.  Skin: Negative.   Neurological: Negative.   Psychiatric/Behavioral: Negative.      Objective:  Physical Exam Constitutional:      Appearance: She is well-developed.  HENT:     Head: Normocephalic and atraumatic.     Comments: Hard of hearing Cardiovascular:     Rate and Rhythm: Normal rate and regular rhythm.  Pulmonary:     Effort: Pulmonary effort is normal. No respiratory distress.     Breath sounds: Normal breath sounds. No wheezing or rales.  Abdominal:     General: Bowel sounds are normal. There is no distension.     Palpations: Abdomen is soft.     Tenderness: There is no abdominal tenderness. There is no rebound.  Musculoskeletal:     Cervical back: Normal range of motion.  Skin:    General: Skin is warm and dry.     Comments: Lots of moles  Neurological:     Mental Status: She is alert and oriented to person, place, and time.     Coordination: Coordination abnormal.     Vitals:   10/04/22 1613  BP: 120/60  Pulse: 87  Temp: 98.3 F (36.8 C)  TempSrc: Oral  SpO2: 99%    Assessment & Plan:

## 2022-10-04 NOTE — Assessment & Plan Note (Signed)
Checking CBC and adjust as needed.  

## 2022-10-04 NOTE — Patient Instructions (Signed)
We will check the labs and urine today.   

## 2022-10-04 NOTE — Assessment & Plan Note (Signed)
Checking lipid panel and adjust atorvastatin 40 mg daily as needed for LDL goal <70.

## 2022-10-04 NOTE — Assessment & Plan Note (Signed)
Using 3-4 L O2 all the time now. Doing okay but does get SOB with exertion.

## 2022-10-04 NOTE — Assessment & Plan Note (Signed)
Checking HgA1c since doing labs. Endo does manage her regimen and will forward results. No low sugars recently and she does take mealtime sliding scale novolog and lantus 25 units daily.

## 2022-10-04 NOTE — Assessment & Plan Note (Signed)
Flu shot up to date. Covid-19 up to date. Pneumonia complete. Shingrix due at pharmacy. Tetanus due at pharmacy. Colonoscopy aged out. Mammogram aged out, pap smear aged out and dexa complete. Counseled about sun safety and mole surveillance. Counseled about the dangers of distracted driving. Given 10 year screening recommendations.

## 2022-10-04 NOTE — Assessment & Plan Note (Signed)
BP at goal needs BMP given change with addition of eplerenone and change or torsemide to 30 mg every other day. Continue hydralazine 25 mg BID as well and metoprolol 25 mg BID. Adjust as needed.

## 2022-10-10 ENCOUNTER — Other Ambulatory Visit: Payer: Medicare Other

## 2022-10-11 ENCOUNTER — Other Ambulatory Visit: Payer: Self-pay | Admitting: *Deleted

## 2022-10-11 ENCOUNTER — Other Ambulatory Visit: Payer: Self-pay

## 2022-10-11 ENCOUNTER — Ambulatory Visit: Payer: Medicare Other | Attending: Cardiovascular Disease

## 2022-10-11 ENCOUNTER — Telehealth: Payer: Self-pay | Admitting: Cardiovascular Disease

## 2022-10-11 DIAGNOSIS — N1832 Chronic kidney disease, stage 3b: Secondary | ICD-10-CM

## 2022-10-11 DIAGNOSIS — Z79899 Other long term (current) drug therapy: Secondary | ICD-10-CM

## 2022-10-11 DIAGNOSIS — R739 Hyperglycemia, unspecified: Secondary | ICD-10-CM

## 2022-10-11 DIAGNOSIS — R81 Glycosuria: Secondary | ICD-10-CM

## 2022-10-11 DIAGNOSIS — I10 Essential (primary) hypertension: Secondary | ICD-10-CM

## 2022-10-11 DIAGNOSIS — E785 Hyperlipidemia, unspecified: Secondary | ICD-10-CM

## 2022-10-11 DIAGNOSIS — I5032 Chronic diastolic (congestive) heart failure: Secondary | ICD-10-CM | POA: Diagnosis not present

## 2022-10-11 NOTE — Telephone Encounter (Signed)
Spoke with Dr Acie Fredrickson who gave verbal okay to order labs that PCP wanted drawn ([place]) so that patient would not have to travel back to their office as she was coming here today for BMET to be drawn. Per Dr Sharlet Salina, she would like patient to have a pro-BNP, CBC, CMP, Hemoglobin A1C, lipids, urine culture, and urinalysis with reflex.  Orders placed at this time. Will forward results to PCP for review.

## 2022-10-12 LAB — CBC
Hematocrit: 35.8 % (ref 34.0–46.6)
Hemoglobin: 11.2 g/dL (ref 11.1–15.9)
MCH: 28 pg (ref 26.6–33.0)
MCHC: 31.3 g/dL — ABNORMAL LOW (ref 31.5–35.7)
MCV: 90 fL (ref 79–97)
Platelets: 310 10*3/uL (ref 150–450)
RBC: 4 x10E6/uL (ref 3.77–5.28)
RDW: 14.5 % (ref 11.7–15.4)
WBC: 12.5 10*3/uL — ABNORMAL HIGH (ref 3.4–10.8)

## 2022-10-12 LAB — BASIC METABOLIC PANEL
BUN/Creatinine Ratio: 29 — ABNORMAL HIGH (ref 12–28)
BUN: 54 mg/dL — ABNORMAL HIGH (ref 8–27)
CO2: 25 mmol/L (ref 20–29)
Calcium: 10 mg/dL (ref 8.7–10.3)
Chloride: 96 mmol/L (ref 96–106)
Creatinine, Ser: 1.88 mg/dL — ABNORMAL HIGH (ref 0.57–1.00)
Glucose: 207 mg/dL — ABNORMAL HIGH (ref 70–99)
Potassium: 4.8 mmol/L (ref 3.5–5.2)
Sodium: 140 mmol/L (ref 134–144)
eGFR: 25 mL/min/{1.73_m2} — ABNORMAL LOW (ref >59)

## 2022-10-12 LAB — LIPID PANEL
Chol/HDL Ratio: 4.7 ratio — ABNORMAL HIGH (ref 0.0–4.4)
Cholesterol, Total: 180 mg/dL (ref 100–199)
HDL: 38 mg/dL — ABNORMAL LOW (ref >39)
LDL Chol Calc (NIH): 107 mg/dL — ABNORMAL HIGH (ref 0–99)
Triglycerides: 198 mg/dL — ABNORMAL HIGH (ref 0–149)
VLDL Cholesterol Cal: 35 mg/dL (ref 5–40)

## 2022-10-12 LAB — SPECIMEN STATUS REPORT

## 2022-10-12 LAB — COMPREHENSIVE METABOLIC PANEL
ALT: 16 IU/L (ref 0–32)
AST: 21 IU/L (ref 0–40)
Albumin/Globulin Ratio: 2.1 (ref 1.2–2.2)
Albumin: 4.2 g/dL (ref 3.7–4.7)
Alkaline Phosphatase: 109 IU/L (ref 44–121)
BUN/Creatinine Ratio: 29 — ABNORMAL HIGH (ref 12–28)
BUN: 58 mg/dL — ABNORMAL HIGH (ref 8–27)
Bilirubin Total: 0.2 mg/dL (ref 0.0–1.2)
CO2: 26 mmol/L (ref 20–29)
Calcium: 10 mg/dL (ref 8.7–10.3)
Chloride: 96 mmol/L (ref 96–106)
Creatinine, Ser: 1.97 mg/dL — ABNORMAL HIGH (ref 0.57–1.00)
Globulin, Total: 2 g/dL (ref 1.5–4.5)
Glucose: 203 mg/dL — ABNORMAL HIGH (ref 70–99)
Potassium: 4.8 mmol/L (ref 3.5–5.2)
Sodium: 140 mmol/L (ref 134–144)
Total Protein: 6.2 g/dL (ref 6.0–8.5)
eGFR: 24 mL/min/{1.73_m2} — ABNORMAL LOW (ref >59)

## 2022-10-12 LAB — HEMOGLOBIN A1C
Est. average glucose Bld gHb Est-mCnc: 180 mg/dL
Hgb A1c MFr Bld: 7.9 % — ABNORMAL HIGH (ref 4.8–5.6)

## 2022-10-14 ENCOUNTER — Telehealth: Payer: Self-pay | Admitting: Internal Medicine

## 2022-10-14 NOTE — Telephone Encounter (Signed)
LVM for pt to rtn my call to schedule AWV with [facility] call back # [phone] 

## 2022-10-18 LAB — SPECIMEN STATUS REPORT

## 2022-10-18 LAB — PRO B NATRIURETIC PEPTIDE: NT-Pro BNP: 1047 pg/mL — ABNORMAL HIGH (ref 0–738)

## 2022-11-02 ENCOUNTER — Other Ambulatory Visit: Payer: Self-pay | Admitting: Internal Medicine

## 2022-11-23 ENCOUNTER — Other Ambulatory Visit: Payer: Self-pay | Admitting: Cardiovascular Disease

## 2022-12-21 ENCOUNTER — Other Ambulatory Visit: Payer: Self-pay | Admitting: Internal Medicine

## 2022-12-24 ENCOUNTER — Ambulatory Visit: Payer: Medicare Other | Attending: Cardiovascular Disease | Admitting: Cardiovascular Disease

## 2022-12-24 ENCOUNTER — Ambulatory Visit: Payer: Medicare Other

## 2022-12-24 ENCOUNTER — Encounter: Payer: Self-pay | Admitting: Cardiovascular Disease

## 2022-12-24 VITALS — BP 128/72 | HR 71 | Ht 64.0 in | Wt 208.4 lb

## 2022-12-24 DIAGNOSIS — I5032 Chronic diastolic (congestive) heart failure: Secondary | ICD-10-CM | POA: Diagnosis not present

## 2022-12-24 DIAGNOSIS — Z79899 Other long term (current) drug therapy: Secondary | ICD-10-CM

## 2022-12-24 NOTE — Addendum Note (Signed)
Addended by: Shellia Cleverly on: 12/24/2022 01:59 PM   Modules accepted: Orders

## 2022-12-24 NOTE — Patient Instructions (Addendum)
Medication Instructions:  The current medical regimen is effective;  continue present plan and medications.  You may increase your Home 02 to 5 l/m as needed.   *If you need a refill on your cardiac medications before your next appointment, please call your pharmacy*   Lab Work: Please have blood work today (BMP, Mg)  If you have labs (blood work) drawn today and your tests are completely normal, you will receive your results only by: MyChart Message (if you have MyChart) OR A paper copy in the mail If you have any lab test that is abnormal or we need to change your treatment, we will call you to review the results.   Follow-Up: At [facility], you and your health needs are our priority.  As part of our continuing mission to provide you with exceptional heart care, we have created designated Provider Care Teams.  These Care Teams include your primary Cardiologist (physician) and Advanced Practice Providers (APPs -  Physician Assistants and Nurse Practitioners) who all work together to provide you with the care you need, when you need it.  We recommend signing up for the patient portal called "MyChart".  Sign up information is provided on this After Visit Summary.  MyChart is used to connect with patients for Virtual Visits (Telemedicine).  Patients are able to view lab/test results, encounter notes, upcoming appointments, etc.  Non-urgent messages can be sent to your provider as well.   To learn more about what you can do with MyChart, go to [email or web address].    Your next appointment:   6 month(s)  Provider:   Mertie Moores, MD

## 2022-12-24 NOTE — Progress Notes (Signed)
Cardiology Office Note   Date:  12/24/2022   ID:  SAUMYA DUGUE, DOB 12-06-1930, MRN [id number]  PCP:  Hoyt Koch, MD  Cardiologist: Darlin Coco MD, now [place]  Chief Complaint  Patient presents with   Congestive Heart Failure        Problem list 1. Chronic diastolic congestive heart failure 2. History of pericardial effusion-status post pericardial window 3.  Aortic stenosis 4. Diabetes mellitus 5. Coronary artery disease 6. Essential hypertension 7. Hyperlipidemia   Notes from [place] - Feb. 2017:  CEANNA MATECKI is a 89 y.o. female who presents for a six-month follow-up visit   She has a past history of diastolic congestive heart failure. Her last echocardiogram on 02/10/12 showed an ejection fraction of 60% with normal systolic function and grade 1 diastolic dysfunction. She has a past history of pericardial effusion with pericardial tamponade and in 2010 and underwen a pericardial window with good results. she has a history of mild aortic stenosis. Since we last saw her she underwent left hip replacement by Dr. Mayer Camel and has done well.   Her last echocardiogram was 02/10/12 and showed an ejection fraction of 60%. Since last visit she has been feeling well. Since last visit her husband has been diagnosed with early Alzheimer's but fortunately he seems to be responding nicely to Aricept. Her last visit we gave her a trial off gabapentin for peripheral neuropathy. Initially she did not think that it helped but now she thinks that it has helped and she would like to try a stronger dose.  We will increase the dose to 600 mg each evening. She reports that she is only taking 80 mg of Lasix daily and this has been sufficient to keep her edema at a minimum. He has not been having any recent chest pain.  She is not having any increase in peripheral edema on the lower dose of Lasix.  Her breathing is improved.  Oct. 18, 2017: Still some dyspnea. Does not get any regular   exercise Lots of back and leg pain   Jan. 24, 2018:  Was admitted to the hospital in Dec for shortness of breath .  Was found to have Grade 2 diastolic dysfunction  Was found to have CKD - Diovan was stopped.  BP has been elevated since that time   Aug. 28, 2018:  Meggen is seen today .    She does have DOE.  Does not exercise.  Has chronic diastolic CHF  April 4, [id number]:  Abbegail  is seen today for follow-up of her chronic diastolic congestive heart failure and hypertension. Feeling well  Husband died 2 weeks ago .  Jolinda Croak) ,  Had been in a SNF for 6 months  Walks with walker.  Has lost some weight .  Wt. is 201 lbs today   September 03, 2018: Clear is seen today for follow-up visit.  She has a history of chronic diastolic congestive heart failure.  She has a history of hypertension.  Overall , seems to be doing well   Jan. 20, 2021:  Jaymes is seen today for follow up of her chronic diastolic congestive heart failure, hypertension, hyperlipidemia. She  [place] in Nov, 2020,  Was in the hospital    Was found to have a transverse type II fracture of the odontoid process.  Was not a candidate for surgical intervention .  Still recovering from the fall.   Legs stay swollen - likely due to  her inactivity   July 20 ,2021:  Nao is seen today with son, [name].   Seems to be stable,   Has recurrent bladden infections .  typically e.coli  Her son now recognizes the subtile symptoms and she is able to get treated earlier .  Has chronic leg edema.  Has tried to elevate her legs but is not able to do this regularly   Legs are better in the am.   She uses oxygen at nght now since last hospitalization   Jan. 9, 2023 Tametha is seen today with son, [name] Has chronic diastolic CHF Is on home O2 - 2 LPM Walks to the bathroom around the house Does not walk much  Has had multple falls   Just treated for URI and wheezing  Just tapered off of prednisone  Is having frequent  PVCs today on ECG  Still eating some salt - on lasix 80 mg a day  Bmp today     Nov. 20, 2023 Virgilene is seen today with son, [name].   Rilyn is seen today for follow up of her chronic diastolic CHF Hx of multiple falls . Examined in wheelchair  Uses NIV ( noninvasive ventilator ) , periodically through the day ,  similar to BIPAP   Was in the hospital in Aug. 2023  Tried Sprironolactone  - did not tolerate it due to hyperglycemia  Is doing better on the torsemide   Will try Eplerenone 25 mg a day  Reduce torsemide to 30 mg every other day   BMP in 3 weeks   Feb. 20, 2024:  Anyia is seen for follow up of her chronic diastolic CHF  Is on home O2.   The eplerenone 25 mg QD is working well  Torsemide 30 mg QOD   She is not maintaining her O2 sats on 3 LPM She is requiring 5 LPM   Will increase her O2 requirements on her MAR  O2 sat is 97% on 5 lpm   I have explained that we do not managed long term home O2 at our  cardiology office.   She may need to see pulmonary medicine to manage her Home O2 .    Past Medical History:  Diagnosis Date   Anasarca 04/2009   found to be secondary to pericardial effusion with tamponade/pericardial window done   Chronic diastolic CHF (congestive heart failure) (HCC)    CKD (chronic kidney disease), stage III (HCC)    COPD (chronic obstructive pulmonary disease) ([place])    Coronary artery disease    a. mild-mod CAD 2004.   Diabetes mellitus    insulin dependent   Herpes labialis    Healing herpes labialis   Hypercholesterolemia    Hypertension    Lumbar radiculopathy    Lumbar scoliosis    Morbid obesity ([place])    Pericardial effusion 04/2009   a. 2010 with tamponade s/p window.   PONV (postoperative nausea and vomiting)    has not had any problems since 2001   Spondylolisthesis    Spondylosis     Past Surgical History:  Procedure Laterality Date   BACK SURGERY     microdiskectomy L3-4 on L/partial facetectomy 3-4 on  L/removal of synovial cyst on left L3-4   BREAST BIOPSY     left   CARDIAC CATHETERIZATION  04/25/2003   L heart cath w/coronary angiography/R femoral artery/ EF 65%/no mitral regurgitation/ angiography L main coronary artery smooth & normal   EYE SURGERY  ophthalmoscopy/peritomy adjacent to the limbus from the 8 to 2:30 position superiorly   lumbar laminectomy     SUBXYPHOID PERICARDIAL WINDOW  04/2009   for pericardial effusion w/pericardial tamponade/sac drained w/20-French Baard drain/transesophageal echocardiogram confimed complete evacution of pericardial fluid   TOTAL HIP ARTHROPLASTY  03/06/2012   Procedure: TOTAL HIP ARTHROPLASTY;  Surgeon: Kerin Salen, MD;  Location: [facility];  Service: Orthopedics;  Laterality: Left;  DEPUY/PINNACLE, SUMMIT STEM, CUP POLY & CERAMIC   VAGINAL HYSTERECTOMY       Current Outpatient Medications  Medication Sig Dispense Refill   acetaminophen (TYLENOL) 500 MG tablet Take 500 mg by mouth 2 (two) times daily as needed for mild pain or headache.     albuterol (VENTOLIN HFA) 108 (90 Base) MCG/ACT inhaler Inhale 2 puffs into the lungs every 6 (six) hours as needed for wheezing or shortness of breath. 8 g 3   allopurinol (ZYLOPRIM) 100 MG tablet TAKE 1 TABLET BY MOUTH EVERY DAY 90 tablet 3   ALPRAZolam (XANAX) 0.25 MG tablet TAKE 1 TABLET BY MOUTH AT BEDTIME AS NEEDED FOR ANXIETY 30 tablet 5   aspirin EC 81 MG tablet Take 81 mg by mouth every morning.      atorvastatin (LIPITOR) 40 MG tablet TAKE 1 TABLET ONCE A DAY FOR CHOLESTEROL 90 tablet 3   diclofenac Sodium (VOLTAREN) 1 % GEL Apply 4 g topically 4 (four) times daily. To affected joint. 100 g 11   eplerenone (INSPRA) 25 MG tablet Take 1 tablet (25 mg total) by mouth daily. 90 tablet 3   fluticasone (FLONASE) 50 MCG/ACT nasal spray Place 2 sprays into both nostrils daily. 16 g 1   fluticasone furoate-vilanterol (BREO ELLIPTA) 200-25 MCG/ACT AEPB Inhale 1 puff into the lungs daily. 1 each 11    gabapentin (NEURONTIN) 100 MG capsule TAKE 1 CAPSULE BY MOUTH TWICE A DAY 180 capsule 1   hydrALAZINE (APRESOLINE) 25 MG tablet Take 1 tablet (25 mg total) by mouth 2 (two) times daily. 180 tablet 3   insulin aspart (NOVOLOG) 100 UNIT/ML injection Inject 12-18 Units into the skin 3 (three) times daily with meals.     insulin glargine (LANTUS) 100 UNIT/ML injection Inject 0.25 mLs (25 Units total) into the skin at bedtime. 10 mL 1   ipratropium-albuterol (DUONEB) 0.5-2.5 (3) MG/3ML SOLN Take 3 mLs by nebulization every 6 (six) hours as needed. 360 mL 0   LACTASE PO Take 1 tablet by mouth every morning.     metoprolol tartrate (LOPRESSOR) 25 MG tablet TAKE 1 TABLET BY MOUTH TWICE A DAY 180 tablet 3   Multiple Vitamin (MULTIVITAMIN WITH MINERALS) TABS tablet Take 1 tablet by mouth every morning.     ONE TOUCH ULTRA TEST test strip 1 each by Other route as directed.     OXYGEN Inhale 5 L into the lungs continuous.     Polyethyl Glycol-Propyl Glycol 0.4-0.3 % SOLN Place 2 drops into both eyes daily as needed (dry eyes). Systane     Probiotic Product (PROBIOTIC DAILY PO) Take 1 tablet by mouth every morning.     torsemide (DEMADEX) 10 MG tablet Take 3 tablets (19m) by mouth every other day 135 tablet 3   Vitamin D, Ergocalciferol, (DRISDOL) 1.25 MG (50000 UNIT) CAPS capsule TAKE 1 CAPSULE (50,000 UNITS TOTAL) BY MOUTH EVERY FRIDAY. 12 capsule 3   nitrofurantoin, macrocrystal-monohydrate, (MACROBID) 100 MG capsule Take 1 capsule (100 mg total) by mouth daily. (Patient not taking: Reported on 12/24/2022) 14 capsule 0  No current facility-administered medications for this visit.    Allergies:   Atenolol, Chlorhexidine, Codeine, Lactose intolerance (gi), Lodine [etodolac], Sulfa drugs cross reactors, Sulfamethoxazole, Benzonatate, Escitalopram oxalate, Metformin, Oxycodone-acetaminophen, and Pregabalin    Social History:  The patient  reports that she has never smoked. She has never used smokeless  tobacco. She reports that she does not drink alcohol and does not use drugs.   Family History:  The patient's family history includes Angina in her mother; Coronary artery disease in an other family member; Heart attack in her father; Hypertension in her father; Stroke in her mother.    ROS:  Please see the history of present illness.   Otherwise, review of systems are positive for none.   All other systems are reviewed and negative.    Physical Exam: Blood pressure 128/72, pulse 71, height 5' 4"$  (1.626 m), weight 208 lb 6.4 oz (94.5 kg), SpO2 97 %.       GEN:   elderly female,  examined in wheelchair  no acute distress HEENT: Normal NECK: No JVD; No carotid bruits LYMPHATICS: No lymphadenopathy CARDIAC: RRR   RESPIRATORY:  Clear to auscultation without rales, wheezing or rhonchi  ABDOMEN: Soft, non-tender, non-distended MUSCULOSKELETAL:  trace  edema; No deformity  SKIN: Warm and dry NEUROLOGIC:  Alert and oriented x 3   EKG:       Recent Labs: 05/16/2022: TSH 0.918 06/13/2022: B Natriuretic Peptide 732.5 06/18/2022: Magnesium 2.2 10/11/2022: ALT 16; BUN 54; Creatinine, Ser 1.88; Hemoglobin 11.2; NT-Pro BNP 1,047; Platelets 310; Potassium 4.8; Sodium 140    Lipid Panel    Component Value Date/Time   CHOL 180 10/11/2022 0000   TRIG 198 (H) 10/11/2022 0000   HDL 38 (L) 10/11/2022 0000   CHOLHDL 4.7 (H) 10/11/2022 0000   CHOLHDL 3.1 04/21/2009 0620   VLDL 19 04/21/2009 0620   LDLCALC 107 (H) 10/11/2022 0000    Wt Readings from Last 3 Encounters:  12/24/22 208 lb 6.4 oz (94.5 kg)  09/23/22 190 lb 12.8 oz (86.5 kg)  06/25/22 203 lb 3.2 oz (92.2 kg)     ASSESSMENT AND PLAN:  1.  Chronic diastolic heart failure   seems to be stable , breathing well . On Torsemide 60 mg QOD and Inspra 25 mg a day . Continue     2.  History of pericarditis:       3. diabetes mellitus with diabetic neuropathy:    4. essential hypertension:    BP is well controlled.      Current medicines are reviewed at length with the patient today.  The patient does not have concerns regarding medicines.    Labs/ tests ordered today include:   No orders of the defined types were placed in this encounter.    Mertie Moores, MD  12/24/2022 1:54 PM    [address] [place],  [address] [place], [place]  [address] Pager [phone] Phone: [phone]; Fax: [phone]

## 2022-12-25 LAB — BASIC METABOLIC PANEL
BUN/Creatinine Ratio: 30 — ABNORMAL HIGH (ref 12–28)
BUN: 56 mg/dL — ABNORMAL HIGH (ref 8–27)
CO2: 27 mmol/L (ref 20–29)
Calcium: 9.1 mg/dL (ref 8.7–10.3)
Chloride: 93 mmol/L — ABNORMAL LOW (ref 96–106)
Creatinine, Ser: 1.87 mg/dL — ABNORMAL HIGH (ref 0.57–1.00)
Glucose: 225 mg/dL — ABNORMAL HIGH (ref 70–99)
Potassium: 4.8 mmol/L (ref 3.5–5.2)
Sodium: 135 mmol/L (ref 134–144)
eGFR: 25 mL/min/{1.73_m2} — ABNORMAL LOW (ref >59)

## 2022-12-25 LAB — MAGNESIUM: Magnesium: 2.1 mg/dL (ref 1.6–2.3)

## 2022-12-31 DIAGNOSIS — Z794 Long term (current) use of insulin: Secondary | ICD-10-CM | POA: Diagnosis not present

## 2022-12-31 DIAGNOSIS — E1142 Type 2 diabetes mellitus with diabetic polyneuropathy: Secondary | ICD-10-CM | POA: Diagnosis not present

## 2022-12-31 DIAGNOSIS — E782 Mixed hyperlipidemia: Secondary | ICD-10-CM | POA: Diagnosis not present

## 2022-12-31 DIAGNOSIS — I10 Essential (primary) hypertension: Secondary | ICD-10-CM | POA: Diagnosis not present

## 2023-01-11 ENCOUNTER — Other Ambulatory Visit: Payer: Self-pay | Admitting: Cardiovascular Disease

## 2023-01-15 ENCOUNTER — Other Ambulatory Visit: Payer: Self-pay | Admitting: Cardiovascular Disease

## 2023-01-29 ENCOUNTER — Other Ambulatory Visit: Payer: Self-pay | Admitting: Cardiovascular Disease

## 2023-01-29 ENCOUNTER — Other Ambulatory Visit: Payer: Self-pay | Admitting: Internal Medicine

## 2023-02-19 ENCOUNTER — Other Ambulatory Visit: Payer: Self-pay | Admitting: Internal Medicine

## 2023-02-19 ENCOUNTER — Encounter: Payer: Self-pay | Admitting: Internal Medicine

## 2023-02-20 MED ORDER — CEPHALEXIN 500 MG PO CAPS: 500.0000 mg | ORAL_CAPSULE | Freq: Two times a day (BID) | ORAL | 0 refills | Status: AC

## 2023-02-21 DIAGNOSIS — Z961 Presence of intraocular lens: Secondary | ICD-10-CM | POA: Diagnosis not present

## 2023-02-21 DIAGNOSIS — I350 Nonrheumatic aortic (valve) stenosis: Secondary | ICD-10-CM | POA: Diagnosis not present

## 2023-02-21 DIAGNOSIS — M549 Dorsalgia, unspecified: Secondary | ICD-10-CM | POA: Diagnosis not present

## 2023-02-21 DIAGNOSIS — M545 Low back pain, unspecified: Secondary | ICD-10-CM | POA: Diagnosis not present

## 2023-02-21 DIAGNOSIS — N183 Chronic kidney disease, stage 3 unspecified: Secondary | ICD-10-CM | POA: Diagnosis not present

## 2023-02-21 DIAGNOSIS — M1611 Unilateral primary osteoarthritis, right hip: Secondary | ICD-10-CM | POA: Diagnosis not present

## 2023-02-21 DIAGNOSIS — E871 Hypo-osmolality and hyponatremia: Secondary | ICD-10-CM | POA: Diagnosis not present

## 2023-02-21 DIAGNOSIS — E875 Hyperkalemia: Secondary | ICD-10-CM | POA: Diagnosis not present

## 2023-02-21 DIAGNOSIS — M109 Gout, unspecified: Secondary | ICD-10-CM | POA: Diagnosis not present

## 2023-02-21 DIAGNOSIS — E7800 Pure hypercholesterolemia, unspecified: Secondary | ICD-10-CM | POA: Diagnosis not present

## 2023-02-21 DIAGNOSIS — N390 Urinary tract infection, site not specified: Secondary | ICD-10-CM | POA: Diagnosis not present

## 2023-02-21 DIAGNOSIS — I5030 Unspecified diastolic (congestive) heart failure: Secondary | ICD-10-CM | POA: Diagnosis not present

## 2023-02-21 DIAGNOSIS — I5033 Acute on chronic diastolic (congestive) heart failure: Secondary | ICD-10-CM | POA: Diagnosis not present

## 2023-02-21 DIAGNOSIS — Z7982 Long term (current) use of aspirin: Secondary | ICD-10-CM | POA: Diagnosis not present

## 2023-02-21 DIAGNOSIS — J9611 Chronic respiratory failure with hypoxia: Secondary | ICD-10-CM | POA: Diagnosis not present

## 2023-02-21 DIAGNOSIS — H35033 Hypertensive retinopathy, bilateral: Secondary | ICD-10-CM | POA: Diagnosis not present

## 2023-02-21 DIAGNOSIS — R279 Unspecified lack of coordination: Secondary | ICD-10-CM | POA: Diagnosis not present

## 2023-02-21 DIAGNOSIS — J9612 Chronic respiratory failure with hypercapnia: Secondary | ICD-10-CM | POA: Diagnosis not present

## 2023-02-21 DIAGNOSIS — I10 Essential (primary) hypertension: Secondary | ICD-10-CM | POA: Diagnosis not present

## 2023-02-21 DIAGNOSIS — Z743 Need for continuous supervision: Secondary | ICD-10-CM | POA: Diagnosis not present

## 2023-02-21 DIAGNOSIS — F32A Depression, unspecified: Secondary | ICD-10-CM | POA: Diagnosis not present

## 2023-02-21 DIAGNOSIS — E785 Hyperlipidemia, unspecified: Secondary | ICD-10-CM | POA: Diagnosis not present

## 2023-02-21 DIAGNOSIS — Z794 Long term (current) use of insulin: Secondary | ICD-10-CM | POA: Diagnosis not present

## 2023-02-21 DIAGNOSIS — E860 Dehydration: Secondary | ICD-10-CM | POA: Diagnosis not present

## 2023-02-21 DIAGNOSIS — Z9181 History of falling: Secondary | ICD-10-CM | POA: Diagnosis not present

## 2023-02-21 DIAGNOSIS — R531 Weakness: Secondary | ICD-10-CM | POA: Diagnosis not present

## 2023-02-21 DIAGNOSIS — I130 Hypertensive heart and chronic kidney disease with heart failure and stage 1 through stage 4 chronic kidney disease, or unspecified chronic kidney disease: Secondary | ICD-10-CM | POA: Diagnosis not present

## 2023-02-21 DIAGNOSIS — N179 Acute kidney failure, unspecified: Secondary | ICD-10-CM | POA: Diagnosis not present

## 2023-02-21 DIAGNOSIS — N1832 Chronic kidney disease, stage 3b: Secondary | ICD-10-CM | POA: Diagnosis not present

## 2023-02-21 DIAGNOSIS — E1142 Type 2 diabetes mellitus with diabetic polyneuropathy: Secondary | ICD-10-CM | POA: Diagnosis not present

## 2023-02-21 DIAGNOSIS — J449 Chronic obstructive pulmonary disease, unspecified: Secondary | ICD-10-CM | POA: Diagnosis not present

## 2023-02-21 DIAGNOSIS — I110 Hypertensive heart disease with heart failure: Secondary | ICD-10-CM | POA: Diagnosis not present

## 2023-02-21 DIAGNOSIS — E1122 Type 2 diabetes mellitus with diabetic chronic kidney disease: Secondary | ICD-10-CM | POA: Diagnosis not present

## 2023-02-21 DIAGNOSIS — Z9981 Dependence on supplemental oxygen: Secondary | ICD-10-CM | POA: Diagnosis not present

## 2023-02-21 DIAGNOSIS — Z79899 Other long term (current) drug therapy: Secondary | ICD-10-CM | POA: Diagnosis not present

## 2023-02-21 DIAGNOSIS — E1165 Type 2 diabetes mellitus with hyperglycemia: Secondary | ICD-10-CM | POA: Diagnosis not present

## 2023-02-21 DIAGNOSIS — E119 Type 2 diabetes mellitus without complications: Secondary | ICD-10-CM | POA: Diagnosis not present

## 2023-02-21 DIAGNOSIS — N189 Chronic kidney disease, unspecified: Secondary | ICD-10-CM | POA: Diagnosis not present

## 2023-02-21 DIAGNOSIS — M1990 Unspecified osteoarthritis, unspecified site: Secondary | ICD-10-CM | POA: Diagnosis not present

## 2023-02-21 DIAGNOSIS — D631 Anemia in chronic kidney disease: Secondary | ICD-10-CM | POA: Diagnosis not present

## 2023-02-21 DIAGNOSIS — R5381 Other malaise: Secondary | ICD-10-CM | POA: Diagnosis not present

## 2023-02-21 DIAGNOSIS — I2510 Atherosclerotic heart disease of native coronary artery without angina pectoris: Secondary | ICD-10-CM | POA: Diagnosis not present

## 2023-02-21 DIAGNOSIS — E113393 Type 2 diabetes mellitus with moderate nonproliferative diabetic retinopathy without macular edema, bilateral: Secondary | ICD-10-CM | POA: Diagnosis not present

## 2023-02-21 DIAGNOSIS — D638 Anemia in other chronic diseases classified elsewhere: Secondary | ICD-10-CM | POA: Diagnosis not present

## 2023-02-21 DIAGNOSIS — F0390 Unspecified dementia without behavioral disturbance: Secondary | ICD-10-CM | POA: Diagnosis not present

## 2023-02-21 DIAGNOSIS — E11319 Type 2 diabetes mellitus with unspecified diabetic retinopathy without macular edema: Secondary | ICD-10-CM | POA: Diagnosis not present

## 2023-02-21 DIAGNOSIS — M25551 Pain in right hip: Secondary | ICD-10-CM | POA: Diagnosis not present

## 2023-02-21 DIAGNOSIS — E113553 Type 2 diabetes mellitus with stable proliferative diabetic retinopathy, bilateral: Secondary | ICD-10-CM | POA: Diagnosis not present

## 2023-02-21 DIAGNOSIS — I5032 Chronic diastolic (congestive) heart failure: Secondary | ICD-10-CM | POA: Diagnosis not present

## 2023-02-21 DIAGNOSIS — E1140 Type 2 diabetes mellitus with diabetic neuropathy, unspecified: Secondary | ICD-10-CM | POA: Diagnosis not present

## 2023-02-21 DIAGNOSIS — G4700 Insomnia, unspecified: Secondary | ICD-10-CM | POA: Diagnosis not present

## 2023-02-21 DIAGNOSIS — G8929 Other chronic pain: Secondary | ICD-10-CM | POA: Diagnosis not present

## 2023-02-21 DIAGNOSIS — R300 Dysuria: Secondary | ICD-10-CM | POA: Diagnosis not present

## 2023-02-21 DIAGNOSIS — Z9889 Other specified postprocedural states: Secondary | ICD-10-CM | POA: Diagnosis not present

## 2023-02-26 DIAGNOSIS — Z789 Other specified health status: Secondary | ICD-10-CM | POA: Diagnosis not present

## 2023-02-26 DIAGNOSIS — R300 Dysuria: Secondary | ICD-10-CM | POA: Diagnosis not present

## 2023-02-26 DIAGNOSIS — I5030 Unspecified diastolic (congestive) heart failure: Secondary | ICD-10-CM | POA: Diagnosis not present

## 2023-02-26 DIAGNOSIS — J9611 Chronic respiratory failure with hypoxia: Secondary | ICD-10-CM | POA: Diagnosis not present

## 2023-02-26 DIAGNOSIS — Z7409 Other reduced mobility: Secondary | ICD-10-CM | POA: Diagnosis not present

## 2023-02-26 DIAGNOSIS — N183 Chronic kidney disease, stage 3 unspecified: Secondary | ICD-10-CM | POA: Diagnosis not present

## 2023-02-26 DIAGNOSIS — E1122 Type 2 diabetes mellitus with diabetic chronic kidney disease: Secondary | ICD-10-CM | POA: Diagnosis not present

## 2023-02-26 DIAGNOSIS — R296 Repeated falls: Secondary | ICD-10-CM | POA: Diagnosis not present

## 2023-02-26 DIAGNOSIS — Z794 Long term (current) use of insulin: Secondary | ICD-10-CM | POA: Diagnosis not present

## 2023-02-26 DIAGNOSIS — N390 Urinary tract infection, site not specified: Secondary | ICD-10-CM | POA: Diagnosis not present

## 2023-02-26 DIAGNOSIS — R531 Weakness: Secondary | ICD-10-CM | POA: Diagnosis not present

## 2023-02-26 DIAGNOSIS — E1142 Type 2 diabetes mellitus with diabetic polyneuropathy: Secondary | ICD-10-CM | POA: Diagnosis not present

## 2023-02-26 DIAGNOSIS — Z743 Need for continuous supervision: Secondary | ICD-10-CM | POA: Diagnosis not present

## 2023-02-26 DIAGNOSIS — E113553 Type 2 diabetes mellitus with stable proliferative diabetic retinopathy, bilateral: Secondary | ICD-10-CM | POA: Diagnosis not present

## 2023-02-26 DIAGNOSIS — H35033 Hypertensive retinopathy, bilateral: Secondary | ICD-10-CM | POA: Diagnosis not present

## 2023-02-26 DIAGNOSIS — J449 Chronic obstructive pulmonary disease, unspecified: Secondary | ICD-10-CM | POA: Diagnosis not present

## 2023-02-26 DIAGNOSIS — D631 Anemia in chronic kidney disease: Secondary | ICD-10-CM | POA: Diagnosis not present

## 2023-02-26 DIAGNOSIS — Z9181 History of falling: Secondary | ICD-10-CM | POA: Diagnosis not present

## 2023-02-26 DIAGNOSIS — Z961 Presence of intraocular lens: Secondary | ICD-10-CM | POA: Diagnosis not present

## 2023-02-26 DIAGNOSIS — N179 Acute kidney failure, unspecified: Secondary | ICD-10-CM | POA: Diagnosis not present

## 2023-02-26 DIAGNOSIS — Z9981 Dependence on supplemental oxygen: Secondary | ICD-10-CM | POA: Diagnosis not present

## 2023-02-26 DIAGNOSIS — R279 Unspecified lack of coordination: Secondary | ICD-10-CM | POA: Diagnosis not present

## 2023-02-26 DIAGNOSIS — M1990 Unspecified osteoarthritis, unspecified site: Secondary | ICD-10-CM | POA: Diagnosis not present

## 2023-02-26 DIAGNOSIS — E1165 Type 2 diabetes mellitus with hyperglycemia: Secondary | ICD-10-CM | POA: Diagnosis not present

## 2023-02-26 DIAGNOSIS — I130 Hypertensive heart and chronic kidney disease with heart failure and stage 1 through stage 4 chronic kidney disease, or unspecified chronic kidney disease: Secondary | ICD-10-CM | POA: Diagnosis not present

## 2023-02-26 DIAGNOSIS — I5032 Chronic diastolic (congestive) heart failure: Secondary | ICD-10-CM | POA: Diagnosis not present

## 2023-02-26 DIAGNOSIS — J189 Pneumonia, unspecified organism: Secondary | ICD-10-CM | POA: Diagnosis not present

## 2023-02-26 DIAGNOSIS — E875 Hyperkalemia: Secondary | ICD-10-CM | POA: Diagnosis not present

## 2023-02-26 DIAGNOSIS — N1832 Chronic kidney disease, stage 3b: Secondary | ICD-10-CM | POA: Diagnosis not present

## 2023-02-26 DIAGNOSIS — E113393 Type 2 diabetes mellitus with moderate nonproliferative diabetic retinopathy without macular edema, bilateral: Secondary | ICD-10-CM | POA: Diagnosis not present

## 2023-02-27 DIAGNOSIS — Z7409 Other reduced mobility: Secondary | ICD-10-CM | POA: Diagnosis not present

## 2023-02-27 DIAGNOSIS — R296 Repeated falls: Secondary | ICD-10-CM | POA: Diagnosis not present

## 2023-02-27 DIAGNOSIS — J449 Chronic obstructive pulmonary disease, unspecified: Secondary | ICD-10-CM | POA: Diagnosis not present

## 2023-02-27 DIAGNOSIS — N179 Acute kidney failure, unspecified: Secondary | ICD-10-CM | POA: Diagnosis not present

## 2023-02-27 DIAGNOSIS — Z789 Other specified health status: Secondary | ICD-10-CM | POA: Diagnosis not present

## 2023-02-27 DIAGNOSIS — Z794 Long term (current) use of insulin: Secondary | ICD-10-CM | POA: Diagnosis not present

## 2023-02-27 DIAGNOSIS — E1165 Type 2 diabetes mellitus with hyperglycemia: Secondary | ICD-10-CM | POA: Diagnosis not present

## 2023-02-27 DIAGNOSIS — N1832 Chronic kidney disease, stage 3b: Secondary | ICD-10-CM | POA: Diagnosis not present

## 2023-03-20 DIAGNOSIS — J189 Pneumonia, unspecified organism: Secondary | ICD-10-CM | POA: Diagnosis not present

## 2023-03-20 DIAGNOSIS — J449 Chronic obstructive pulmonary disease, unspecified: Secondary | ICD-10-CM | POA: Diagnosis not present

## 2023-03-20 DIAGNOSIS — Z789 Other specified health status: Secondary | ICD-10-CM | POA: Diagnosis not present

## 2023-03-20 DIAGNOSIS — Z7409 Other reduced mobility: Secondary | ICD-10-CM | POA: Diagnosis not present

## 2023-03-20 DIAGNOSIS — E1165 Type 2 diabetes mellitus with hyperglycemia: Secondary | ICD-10-CM | POA: Diagnosis not present

## 2023-03-20 DIAGNOSIS — Z794 Long term (current) use of insulin: Secondary | ICD-10-CM | POA: Diagnosis not present

## 2023-03-20 DIAGNOSIS — R296 Repeated falls: Secondary | ICD-10-CM | POA: Diagnosis not present

## 2023-03-26 ENCOUNTER — Telehealth: Payer: Self-pay

## 2023-03-26 NOTE — Telephone Encounter (Signed)
(  3:49 pm) PC SW left a message for patient and her son requesting a call back regarding palliative care services referral.

## 2023-04-08 ENCOUNTER — Telehealth: Payer: Self-pay | Admitting: Internal Medicine

## 2023-04-08 NOTE — Telephone Encounter (Signed)
Melissa with [employer] came by and dropped off orders for a non-invasive ventilator for the patient. Asked that forms be faxed back to the number on her business card, it will be attached to the paperwork.

## 2023-04-10 NOTE — Telephone Encounter (Signed)
I have fax this back over for patient

## 2023-04-22 ENCOUNTER — Emergency Department (HOSPITAL_COMMUNITY): Payer: Medicare Other

## 2023-04-22 ENCOUNTER — Inpatient Hospital Stay (HOSPITAL_COMMUNITY)
Admission: EM | Admit: 2023-04-22 | Discharge: 2023-04-25 | DRG: 689 | Disposition: A | Discharge status: Home Health | Payer: Medicare Other | Attending: Family Medicine | Admitting: Family Medicine

## 2023-04-22 ENCOUNTER — Encounter (HOSPITAL_COMMUNITY): Payer: Self-pay

## 2023-04-22 ENCOUNTER — Other Ambulatory Visit: Payer: Self-pay

## 2023-04-22 DIAGNOSIS — R4182 Altered mental status, unspecified: Secondary | ICD-10-CM | POA: Diagnosis not present

## 2023-04-22 DIAGNOSIS — D649 Anemia, unspecified: Secondary | ICD-10-CM | POA: Diagnosis not present

## 2023-04-22 DIAGNOSIS — I2510 Atherosclerotic heart disease of native coronary artery without angina pectoris: Secondary | ICD-10-CM | POA: Diagnosis present

## 2023-04-22 DIAGNOSIS — Z823 Family history of stroke: Secondary | ICD-10-CM

## 2023-04-22 DIAGNOSIS — E1122 Type 2 diabetes mellitus with diabetic chronic kidney disease: Secondary | ICD-10-CM | POA: Diagnosis present

## 2023-04-22 DIAGNOSIS — K5730 Diverticulosis of large intestine without perforation or abscess without bleeding: Secondary | ICD-10-CM | POA: Diagnosis not present

## 2023-04-22 DIAGNOSIS — J449 Chronic obstructive pulmonary disease, unspecified: Secondary | ICD-10-CM | POA: Diagnosis not present

## 2023-04-22 DIAGNOSIS — F0394 Unspecified dementia, unspecified severity, with anxiety: Secondary | ICD-10-CM | POA: Diagnosis present

## 2023-04-22 DIAGNOSIS — Z882 Allergy status to sulfonamides status: Secondary | ICD-10-CM

## 2023-04-22 DIAGNOSIS — E1149 Type 2 diabetes mellitus with other diabetic neurological complication: Secondary | ICD-10-CM | POA: Diagnosis not present

## 2023-04-22 DIAGNOSIS — S12110D Anterior displaced Type II dens fracture, subsequent encounter for fracture with routine healing: Secondary | ICD-10-CM | POA: Diagnosis not present

## 2023-04-22 DIAGNOSIS — R001 Bradycardia, unspecified: Secondary | ICD-10-CM | POA: Diagnosis not present

## 2023-04-22 DIAGNOSIS — Z993 Dependence on wheelchair: Secondary | ICD-10-CM

## 2023-04-22 DIAGNOSIS — E7800 Pure hypercholesterolemia, unspecified: Secondary | ICD-10-CM | POA: Diagnosis not present

## 2023-04-22 DIAGNOSIS — K828 Other specified diseases of gallbladder: Secondary | ICD-10-CM | POA: Diagnosis not present

## 2023-04-22 DIAGNOSIS — S12110A Anterior displaced Type II dens fracture, initial encounter for closed fracture: Secondary | ICD-10-CM | POA: Diagnosis present

## 2023-04-22 DIAGNOSIS — R296 Repeated falls: Secondary | ICD-10-CM | POA: Diagnosis present

## 2023-04-22 DIAGNOSIS — R1311 Dysphagia, oral phase: Secondary | ICD-10-CM | POA: Diagnosis not present

## 2023-04-22 DIAGNOSIS — N390 Urinary tract infection, site not specified: Secondary | ICD-10-CM | POA: Diagnosis present

## 2023-04-22 DIAGNOSIS — N3090 Cystitis, unspecified without hematuria: Principal | ICD-10-CM

## 2023-04-22 DIAGNOSIS — S3993XA Unspecified injury of pelvis, initial encounter: Secondary | ICD-10-CM | POA: Diagnosis not present

## 2023-04-22 DIAGNOSIS — Z9981 Dependence on supplemental oxygen: Secondary | ICD-10-CM

## 2023-04-22 DIAGNOSIS — N183 Chronic kidney disease, stage 3 unspecified: Secondary | ICD-10-CM | POA: Diagnosis present

## 2023-04-22 DIAGNOSIS — I5033 Acute on chronic diastolic (congestive) heart failure: Secondary | ICD-10-CM | POA: Diagnosis not present

## 2023-04-22 DIAGNOSIS — I5032 Chronic diastolic (congestive) heart failure: Secondary | ICD-10-CM | POA: Diagnosis present

## 2023-04-22 DIAGNOSIS — W19XXXA Unspecified fall, initial encounter: Secondary | ICD-10-CM | POA: Diagnosis present

## 2023-04-22 DIAGNOSIS — Z7409 Other reduced mobility: Secondary | ICD-10-CM | POA: Diagnosis present

## 2023-04-22 DIAGNOSIS — Z6835 Body mass index (BMI) 35.0-35.9, adult: Secondary | ICD-10-CM

## 2023-04-22 DIAGNOSIS — E669 Obesity, unspecified: Secondary | ICD-10-CM | POA: Diagnosis present

## 2023-04-22 DIAGNOSIS — R102 Pelvic and perineal pain: Secondary | ICD-10-CM | POA: Diagnosis not present

## 2023-04-22 DIAGNOSIS — I130 Hypertensive heart and chronic kidney disease with heart failure and stage 1 through stage 4 chronic kidney disease, or unspecified chronic kidney disease: Secondary | ICD-10-CM | POA: Diagnosis present

## 2023-04-22 DIAGNOSIS — R918 Other nonspecific abnormal finding of lung field: Secondary | ICD-10-CM | POA: Diagnosis not present

## 2023-04-22 DIAGNOSIS — I10 Essential (primary) hypertension: Secondary | ICD-10-CM | POA: Diagnosis not present

## 2023-04-22 DIAGNOSIS — Z79899 Other long term (current) drug therapy: Secondary | ICD-10-CM | POA: Diagnosis not present

## 2023-04-22 DIAGNOSIS — G9340 Encephalopathy, unspecified: Secondary | ICD-10-CM | POA: Diagnosis present

## 2023-04-22 DIAGNOSIS — R6889 Other general symptoms and signs: Secondary | ICD-10-CM | POA: Diagnosis not present

## 2023-04-22 DIAGNOSIS — N1832 Chronic kidney disease, stage 3b: Secondary | ICD-10-CM | POA: Diagnosis present

## 2023-04-22 DIAGNOSIS — G9341 Metabolic encephalopathy: Secondary | ICD-10-CM | POA: Diagnosis present

## 2023-04-22 DIAGNOSIS — E1142 Type 2 diabetes mellitus with diabetic polyneuropathy: Secondary | ICD-10-CM | POA: Diagnosis not present

## 2023-04-22 DIAGNOSIS — Z7982 Long term (current) use of aspirin: Secondary | ICD-10-CM | POA: Diagnosis not present

## 2023-04-22 DIAGNOSIS — Z8249 Family history of ischemic heart disease and other diseases of the circulatory system: Secondary | ICD-10-CM

## 2023-04-22 DIAGNOSIS — Z7951 Long term (current) use of inhaled steroids: Secondary | ICD-10-CM

## 2023-04-22 DIAGNOSIS — R41841 Cognitive communication deficit: Secondary | ICD-10-CM | POA: Diagnosis not present

## 2023-04-22 DIAGNOSIS — R079 Chest pain, unspecified: Secondary | ICD-10-CM | POA: Diagnosis not present

## 2023-04-22 DIAGNOSIS — Z885 Allergy status to narcotic agent status: Secondary | ICD-10-CM

## 2023-04-22 DIAGNOSIS — Z7401 Bed confinement status: Secondary | ICD-10-CM | POA: Diagnosis not present

## 2023-04-22 DIAGNOSIS — Z96642 Presence of left artificial hip joint: Secondary | ICD-10-CM | POA: Diagnosis present

## 2023-04-22 DIAGNOSIS — Z794 Long term (current) use of insulin: Secondary | ICD-10-CM | POA: Diagnosis not present

## 2023-04-22 DIAGNOSIS — M4186 Other forms of scoliosis, lumbar region: Secondary | ICD-10-CM | POA: Diagnosis present

## 2023-04-22 DIAGNOSIS — R2689 Other abnormalities of gait and mobility: Secondary | ICD-10-CM | POA: Diagnosis not present

## 2023-04-22 DIAGNOSIS — N3000 Acute cystitis without hematuria: Secondary | ICD-10-CM

## 2023-04-22 DIAGNOSIS — R7989 Other specified abnormal findings of blood chemistry: Secondary | ICD-10-CM | POA: Diagnosis not present

## 2023-04-22 DIAGNOSIS — I5030 Unspecified diastolic (congestive) heart failure: Secondary | ICD-10-CM | POA: Diagnosis present

## 2023-04-22 DIAGNOSIS — S0990XA Unspecified injury of head, initial encounter: Secondary | ICD-10-CM | POA: Diagnosis not present

## 2023-04-22 DIAGNOSIS — J9611 Chronic respiratory failure with hypoxia: Secondary | ICD-10-CM | POA: Diagnosis present

## 2023-04-22 DIAGNOSIS — Z741 Need for assistance with personal care: Secondary | ICD-10-CM | POA: Diagnosis not present

## 2023-04-22 DIAGNOSIS — Z66 Do not resuscitate: Secondary | ICD-10-CM | POA: Diagnosis present

## 2023-04-22 DIAGNOSIS — J410 Simple chronic bronchitis: Secondary | ICD-10-CM | POA: Diagnosis not present

## 2023-04-22 DIAGNOSIS — S2242XS Multiple fractures of ribs, left side, sequela: Secondary | ICD-10-CM | POA: Diagnosis not present

## 2023-04-22 DIAGNOSIS — M6281 Muscle weakness (generalized): Secondary | ICD-10-CM | POA: Diagnosis not present

## 2023-04-22 DIAGNOSIS — M6259 Muscle wasting and atrophy, not elsewhere classified, multiple sites: Secondary | ICD-10-CM | POA: Diagnosis not present

## 2023-04-22 DIAGNOSIS — S199XXA Unspecified injury of neck, initial encounter: Secondary | ICD-10-CM | POA: Diagnosis not present

## 2023-04-22 DIAGNOSIS — Z888 Allergy status to other drugs, medicaments and biological substances status: Secondary | ICD-10-CM

## 2023-04-22 DIAGNOSIS — Z886 Allergy status to analgesic agent status: Secondary | ICD-10-CM

## 2023-04-22 HISTORY — DX: Unspecified dementia, unspecified severity, without behavioral disturbance, psychotic disturbance, mood disturbance, and anxiety: F03.90

## 2023-04-22 LAB — CBC WITH DIFFERENTIAL/PLATELET
Abs Immature Granulocytes: 0.03 10*3/uL (ref 0.00–0.07)
Basophils Absolute: 0 10*3/uL (ref 0.0–0.1)
Basophils Relative: 0 %
Eosinophils Absolute: 0.4 10*3/uL (ref 0.0–0.5)
Eosinophils Relative: 4 %
HCT: 38.5 % (ref 36.0–46.0)
Hemoglobin: 11.2 g/dL — ABNORMAL LOW (ref 12.0–15.0)
Immature Granulocytes: 0 %
Lymphocytes Relative: 15 %
Lymphs Abs: 1.4 10*3/uL (ref 0.7–4.0)
MCH: 26.4 pg (ref 26.0–34.0)
MCHC: 29.1 g/dL — ABNORMAL LOW (ref 30.0–36.0)
MCV: 90.6 fL (ref 80.0–100.0)
Monocytes Absolute: 0.9 10*3/uL (ref 0.1–1.0)
Monocytes Relative: 9 %
Neutro Abs: 6.8 10*3/uL (ref 1.7–7.7)
Neutrophils Relative %: 72 %
Platelets: 273 10*3/uL (ref 150–400)
RBC: 4.25 MIL/uL (ref 3.87–5.11)
RDW: 17 % — ABNORMAL HIGH (ref 11.5–15.5)
WBC: 9.5 10*3/uL (ref 4.0–10.5)
nRBC: 0 % (ref 0.0–0.2)

## 2023-04-22 LAB — URINALYSIS, ROUTINE W REFLEX MICROSCOPIC
Bilirubin Urine: NEGATIVE
Glucose, UA: NEGATIVE mg/dL
Hgb urine dipstick: NEGATIVE
Ketones, ur: NEGATIVE mg/dL
Nitrite: NEGATIVE
Protein, ur: NEGATIVE mg/dL
Specific Gravity, Urine: 1.008 (ref 1.005–1.030)
pH: 6 (ref 5.0–8.0)

## 2023-04-22 LAB — I-STAT VENOUS BLOOD GAS, ED
Acid-Base Excess: 10 mmol/L — ABNORMAL HIGH (ref 0.0–2.0)
Bicarbonate: 35 mmol/L — ABNORMAL HIGH (ref 20.0–28.0)
Calcium, Ion: 1.08 mmol/L — ABNORMAL LOW (ref 1.15–1.40)
HCT: 37 % (ref 36.0–46.0)
Hemoglobin: 12.6 g/dL (ref 12.0–15.0)
O2 Saturation: 54 %
Potassium: 3.6 mmol/L (ref 3.5–5.1)
Sodium: 138 mmol/L (ref 135–145)
TCO2: 37 mmol/L — ABNORMAL HIGH (ref 22–32)
pCO2, Ven: 49.8 mmHg (ref 44–60)
pH, Ven: 7.455 — ABNORMAL HIGH (ref 7.25–7.43)
pO2, Ven: 27 mmHg — CL (ref 32–45)

## 2023-04-22 LAB — COMPREHENSIVE METABOLIC PANEL
ALT: 15 U/L (ref 0–44)
AST: 23 U/L (ref 15–41)
Albumin: 3.4 g/dL — ABNORMAL LOW (ref 3.5–5.0)
Alkaline Phosphatase: 84 U/L (ref 38–126)
Anion gap: 13 (ref 5–15)
BUN: 36 mg/dL — ABNORMAL HIGH (ref 8–23)
CO2: 33 mmol/L — ABNORMAL HIGH (ref 22–32)
Calcium: 9.3 mg/dL (ref 8.9–10.3)
Chloride: 96 mmol/L — ABNORMAL LOW (ref 98–111)
Creatinine, Ser: 1.69 mg/dL — ABNORMAL HIGH (ref 0.44–1.00)
GFR, Estimated: 29 mL/min — ABNORMAL LOW (ref >60)
Glucose, Bld: 135 mg/dL — ABNORMAL HIGH (ref 70–99)
Potassium: 3.7 mmol/L (ref 3.5–5.1)
Sodium: 142 mmol/L (ref 135–145)
Total Bilirubin: 0.7 mg/dL (ref 0.3–1.2)
Total Protein: 5.9 g/dL — ABNORMAL LOW (ref 6.5–8.1)

## 2023-04-22 LAB — I-STAT CHEM 8, ED
BUN: 37 mg/dL — ABNORMAL HIGH (ref 8–23)
Calcium, Ion: 1.08 mmol/L — ABNORMAL LOW (ref 1.15–1.40)
Chloride: 97 mmol/L — ABNORMAL LOW (ref 98–111)
Creatinine, Ser: 1.6 mg/dL — ABNORMAL HIGH (ref 0.44–1.00)
Glucose, Bld: 128 mg/dL — ABNORMAL HIGH (ref 70–99)
HCT: 37 % (ref 36.0–46.0)
Hemoglobin: 12.6 g/dL (ref 12.0–15.0)
Potassium: 3.6 mmol/L (ref 3.5–5.1)
Sodium: 138 mmol/L (ref 135–145)
TCO2: 33 mmol/L — ABNORMAL HIGH (ref 22–32)

## 2023-04-22 LAB — HEMOGLOBIN A1C
Hgb A1c MFr Bld: 7 % — ABNORMAL HIGH (ref 4.8–5.6)
Mean Plasma Glucose: 154.2 mg/dL

## 2023-04-22 LAB — TROPONIN I (HIGH SENSITIVITY)
Troponin I (High Sensitivity): 34 ng/L — ABNORMAL HIGH (ref <18)
Troponin I (High Sensitivity): 35 ng/L — ABNORMAL HIGH (ref <18)

## 2023-04-22 LAB — GLUCOSE, CAPILLARY
Glucose-Capillary: 69 mg/dL — ABNORMAL LOW (ref 70–99)
Glucose-Capillary: 118 mg/dL — ABNORMAL HIGH (ref 70–99)
Glucose-Capillary: 184 mg/dL — ABNORMAL HIGH (ref 70–99)

## 2023-04-22 LAB — BRAIN NATRIURETIC PEPTIDE: B Natriuretic Peptide: 608.7 pg/mL — ABNORMAL HIGH (ref 0.0–100.0)

## 2023-04-22 LAB — ETHANOL: Alcohol, Ethyl (B): <10 mg/dL (ref <10)

## 2023-04-22 LAB — CK: Total CK: 37 U/L — ABNORMAL LOW (ref 38–234)

## 2023-04-22 MED ORDER — FLUTICASONE PROPIONATE 50 MCG/ACT NA SUSP
2.0000 | Freq: Every day | NASAL | Status: DC
  Administered 2023-04-22 – 2023-04-24 (×3): 2 via NASAL
  Filled 2023-04-22: qty 16

## 2023-04-22 MED ORDER — IPRATROPIUM-ALBUTEROL 0.5-2.5 (3) MG/3ML IN SOLN
3.0000 mL | Freq: Two times a day (BID) | RESPIRATORY_TRACT | Status: DC
  Administered 2023-04-22: 3 mL via RESPIRATORY_TRACT
  Filled 2023-04-22: qty 3

## 2023-04-22 MED ORDER — IPRATROPIUM-ALBUTEROL 0.5-2.5 (3) MG/3ML IN SOLN
3.0000 mL | Freq: Once | RESPIRATORY_TRACT | Status: AC
  Administered 2023-04-22: 3 mL via RESPIRATORY_TRACT
  Filled 2023-04-22: qty 3

## 2023-04-22 MED ORDER — ENOXAPARIN SODIUM 40 MG/0.4ML IJ SOSY
40.0000 mg | PREFILLED_SYRINGE | INTRAMUSCULAR | Status: DC
  Administered 2023-04-22: 40 mg via SUBCUTANEOUS
  Filled 2023-04-22: qty 0.4

## 2023-04-22 MED ORDER — INSULIN ASPART 100 UNIT/ML IJ SOLN: 0.0000 [IU] | Freq: Every day | INTRAMUSCULAR | Status: DC

## 2023-04-22 MED ORDER — INSULIN GLARGINE-YFGN 100 UNIT/ML ~~LOC~~ SOLN
17.0000 [IU] | Freq: Every day | SUBCUTANEOUS | Status: DC
  Administered 2023-04-22 – 2023-04-24 (×3): 17 [IU] via SUBCUTANEOUS
  Filled 2023-04-22 (×4): qty 0.17

## 2023-04-22 MED ORDER — SODIUM CHLORIDE 0.9 % IV SOLN
1.0000 g | Freq: Once | INTRAVENOUS | Status: AC
  Administered 2023-04-22: 1 g via INTRAVENOUS
  Filled 2023-04-22: qty 10

## 2023-04-22 MED ORDER — GABAPENTIN 100 MG PO CAPS
100.0000 mg | ORAL_CAPSULE | Freq: Two times a day (BID) | ORAL | Status: DC
  Administered 2023-04-22 – 2023-04-25 (×6): 100 mg via ORAL
  Filled 2023-04-22 (×6): qty 1

## 2023-04-22 MED ORDER — ACETAMINOPHEN 325 MG PO TABS: 650.0000 mg | ORAL_TABLET | Freq: Four times a day (QID) | ORAL | Status: DC | PRN

## 2023-04-22 MED ORDER — FLUTICASONE FUROATE-VILANTEROL 200-25 MCG/ACT IN AEPB
1.0000 | INHALATION_SPRAY | Freq: Every day | RESPIRATORY_TRACT | Status: DC
  Administered 2023-04-23 – 2023-04-25 (×3): 1 via RESPIRATORY_TRACT
  Filled 2023-04-22: qty 28

## 2023-04-22 MED ORDER — RISAQUAD PO CAPS
1.0000 | ORAL_CAPSULE | Freq: Every day | ORAL | Status: DC
  Administered 2023-04-23 – 2023-04-25 (×3): 1 via ORAL
  Filled 2023-04-22 (×3): qty 1

## 2023-04-22 MED ORDER — ALBUTEROL SULFATE (2.5 MG/3ML) 0.083% IN NEBU: 2.5000 mg | INHALATION_SOLUTION | RESPIRATORY_TRACT | Status: DC | PRN

## 2023-04-22 MED ORDER — ENOXAPARIN SODIUM 30 MG/0.3ML IJ SOSY
30.0000 mg | PREFILLED_SYRINGE | INTRAMUSCULAR | Status: DC
  Administered 2023-04-23 – 2023-04-25 (×3): 30 mg via SUBCUTANEOUS
  Filled 2023-04-22 (×3): qty 0.3

## 2023-04-22 MED ORDER — ATORVASTATIN CALCIUM 40 MG PO TABS
40.0000 mg | ORAL_TABLET | Freq: Every day | ORAL | Status: DC
  Administered 2023-04-23 – 2023-04-24 (×2): 40 mg via ORAL
  Filled 2023-04-22 (×2): qty 1

## 2023-04-22 MED ORDER — METOPROLOL TARTRATE 25 MG PO TABS
25.0000 mg | ORAL_TABLET | Freq: Two times a day (BID) | ORAL | Status: DC
  Administered 2023-04-22 – 2023-04-25 (×6): 25 mg via ORAL
  Filled 2023-04-22 (×6): qty 1

## 2023-04-22 MED ORDER — ACETAMINOPHEN 650 MG RE SUPP: 650.0000 mg | Freq: Four times a day (QID) | RECTAL | Status: DC | PRN

## 2023-04-22 MED ORDER — SODIUM CHLORIDE 0.9 % IV SOLN
1.0000 g | INTRAVENOUS | Status: DC
  Administered 2023-04-23 – 2023-04-24 (×2): 1 g via INTRAVENOUS
  Filled 2023-04-22 (×2): qty 10

## 2023-04-22 MED ORDER — SODIUM CHLORIDE 0.9% FLUSH
3.0000 mL | Freq: Two times a day (BID) | INTRAVENOUS | Status: DC
  Administered 2023-04-22 – 2023-04-24 (×4): 3 mL via INTRAVENOUS

## 2023-04-22 MED ORDER — GUAIFENESIN ER 600 MG PO TB12
600.0000 mg | ORAL_TABLET | Freq: Two times a day (BID) | ORAL | Status: DC
  Administered 2023-04-22 – 2023-04-25 (×6): 600 mg via ORAL
  Filled 2023-04-22 (×6): qty 1

## 2023-04-22 MED ORDER — LACTASE 3000 UNITS PO TABS
6000.0000 [IU] | ORAL_TABLET | Freq: Every morning | ORAL | Status: DC
  Administered 2023-04-23 – 2023-04-25 (×3): 6000 [IU] via ORAL
  Filled 2023-04-22 (×4): qty 2

## 2023-04-22 MED ORDER — INSULIN ASPART 100 UNIT/ML IJ SOLN: 0.0000 [IU] | Freq: Three times a day (TID) | INTRAMUSCULAR | Status: DC

## 2023-04-22 MED ORDER — FUROSEMIDE 10 MG/ML IJ SOLN
40.0000 mg | Freq: Once | INTRAMUSCULAR | Status: AC
  Administered 2023-04-22: 40 mg via INTRAVENOUS
  Filled 2023-04-22: qty 4

## 2023-04-22 MED ORDER — FUROSEMIDE 10 MG/ML IJ SOLN: 40.0000 mg | Freq: Two times a day (BID) | INTRAMUSCULAR | Status: DC

## 2023-04-22 MED ORDER — PROBIOTIC DAILY PO CAPS: ORAL_CAPSULE | Freq: Every morning | ORAL | Status: DC

## 2023-04-22 MED ORDER — INSULIN ASPART 100 UNIT/ML IJ SOLN
0.0000 [IU] | Freq: Three times a day (TID) | INTRAMUSCULAR | Status: DC
  Administered 2023-04-23: 3 [IU] via SUBCUTANEOUS
  Administered 2023-04-24: 2 [IU] via SUBCUTANEOUS
  Administered 2023-04-24: 3 [IU] via SUBCUTANEOUS
  Administered 2023-04-24: 5 [IU] via SUBCUTANEOUS
  Administered 2023-04-25: 3 [IU] via SUBCUTANEOUS
  Administered 2023-04-25 (×2): 2 [IU] via SUBCUTANEOUS

## 2023-04-22 NOTE — ED Notes (Signed)
Pt's O2 sats will go up to 100% and drop to low 80's. Pt is taking shallow breaths. Dr Durwin Nora aware and is going to order Bi-pap.

## 2023-04-22 NOTE — Progress Notes (Signed)
New Admission Note:   Arrival Method: Stretcher  Mental Orientation: Alert oriented to self, situation, disoriented to place and time  Telemetry: Box 21  Assessment: Completed Skin: intact scab/warts on back  ZO:[id number] forearm  Pain: 0/10  Tubes: nasal tube  Safety Measures: Safety Fall Prevention Plan has been given, discussed and signed Admission: Completed [facility] Orientation: Patient has been orientated to the room, unit and staff.  Family: son   Orders have been reviewed and implemented. Will continue to monitor the patient. Call light has been placed within reach and bed alarm has been activated.   Stacie Glaze LPN [facility] Phone: [phone]

## 2023-04-22 NOTE — ED Provider Notes (Signed)
[place] EMERGENCY DEPARTMENT AT [facility] Provider Note   CSN: [id number] Arrival date & time: 04/22/23  0930     History  Chief Complaint  Patient presents with   Fall   Altered Mental Status    [name] is a 89 y.o. female.  HPI Patient presents for possible fall.  Medical history includes DM, CHF, gout, arthritis, anemia, HTN, neuropathy, COPD, HLD.  She lives at home with her son.  At baseline, she is on 6 L of supplemental oxygen.  Oxygen need has been increasing over the past several weeks. He also reports progressive confusion over the last several days.  This morning, patient was found on the floor with oxygen off.  Patient's son called EMS. EMS reported normal SpO2 on her baseline 6 L.  Other vital signs were reassuring.  Her heart rate was low but that is apparently her baseline.  CBG was in the 180s.  Patient currently denies any areas of pain.     Home Medications Prior to Admission medications   Medication Sig Start Date End Date Taking? Authorizing Provider  acetaminophen (TYLENOL) 500 MG tablet Take 500 mg by mouth 2 (two) times daily as needed for mild pain or headache.   Yes [provider]  albuterol (VENTOLIN HFA) 108 (90 Base) MCG/ACT inhaler Inhale 2 puffs into the lungs every 6 (six) hours as needed for wheezing or shortness of breath. 11/01/21  Yes Corwin Levins, MD  allopurinol (ZYLOPRIM) 100 MG tablet TAKE 1 TABLET BY MOUTH EVERY DAY Patient taking differently: Take 100 mg by mouth daily. 09/19/22  Yes Myrlene Broker, MD  ALPRAZolam Prudy Feeler) 0.25 MG tablet TAKE 1 TABLET BY MOUTH AT BEDTIME AS NEEDED FOR ANXIETY Patient taking differently: Take 0.25 mg by mouth at bedtime. 11/05/22  Yes Myrlene Broker, MD  aspirin EC 81 MG tablet Take 81 mg by mouth every morning.    Yes [provider]  atorvastatin (LIPITOR) 40 MG tablet TAKE 1 TABLET ONCE A DAY FOR CHOLESTEROL Patient taking differently: Take 40 mg by mouth at  bedtime. 09/19/22  Yes Myrlene Broker, MD  diclofenac Sodium (VOLTAREN) 1 % GEL Apply 4 g topically 4 (four) times daily. To affected joint. Patient taking differently: Apply 4 g topically 4 (four) times daily as needed (Pain). To affected joint. 06/27/20  Yes Rodolph Bong, MD  fluticasone (FLONASE) 50 MCG/ACT nasal spray Place 2 sprays into both nostrils daily. Patient taking differently: Place 2 sprays into both nostrils at bedtime. 10/15/16  Yes Tat, Onalee Hua, MD  fluticasone furoate-vilanterol (BREO ELLIPTA) 200-25 MCG/ACT AEPB TAKE 1 PUFF BY MOUTH EVERY DAY Patient taking differently: Inhale 1 puff into the lungs daily. 02/19/23  Yes Myrlene Broker, MD  gabapentin (NEURONTIN) 100 MG capsule TAKE 1 CAPSULE BY MOUTH TWICE A DAY 09/19/22  Yes Myrlene Broker, MD  insulin aspart (NOVOLOG) 100 UNIT/ML injection Inject 0-17 Units into the skin 3 (three) times daily with meals. Per sliding scale.   Yes [provider]  insulin glargine (LANTUS) 100 UNIT/ML injection Inject 0.25 mLs (25 Units total) into the skin at bedtime. Patient taking differently: Inject 17 Units into the skin at bedtime. 12/24/19  Yes Jae Dire, MD  ipratropium-albuterol (DUONEB) 0.5-2.5 (3) MG/3ML SOLN Take 3 mLs by nebulization every 6 (six) hours as needed. Patient taking differently: Take 3 mLs by nebulization 2 (two) times daily. 05/21/22  Yes Elgergawy, Leana Roe, MD  LACTASE PO Take 1  tablet by mouth every morning.   Yes [provider]  metoprolol tartrate (LOPRESSOR) 25 MG tablet TAKE 1 TABLET BY MOUTH TWICE A DAY 09/19/22  Yes Myrlene Broker, MD  Multiple Vitamin (MULTIVITAMIN WITH MINERALS) TABS tablet Take 1 tablet by mouth every morning.   Yes [provider]  OXYGEN Inhale 5-6 L into the lungs continuous.   Yes [provider]  Polyethyl Glycol-Propyl Glycol 0.4-0.3 % SOLN Place 2 drops into both eyes daily as needed (dry eyes). Systane   Yes [provider]  Probiotic Product (PROBIOTIC DAILY PO) Take 1 tablet by mouth every morning.   Yes [provider]  torsemide (DEMADEX) 10 MG tablet Take 3 tablets (30mg ) by mouth every other day Patient taking differently: Take 10-30 mg by mouth daily. Dosage depending on feet swelling. 09/23/22  Yes Nahser, Deloris Ping, MD  Vitamin D, Ergocalciferol, (DRISDOL) 1.25 MG (50000 UNIT) CAPS capsule TAKE 1 CAPSULE (50,000 UNITS TOTAL) BY MOUTH EVERY FRIDAY. Patient taking differently: Take 50,000 Units by mouth every 7 (seven) days. Friday 12/23/22  Yes Myrlene Broker, MD  eplerenone (INSPRA) 25 MG tablet Take 1 tablet (25 mg total) by mouth daily. Patient not taking: Reported on 04/22/2023 09/23/22   Nahser, Deloris Ping, MD  hydrALAZINE (APRESOLINE) 25 MG tablet TAKE 1 TABLET BY MOUTH TWICE A DAY Patient not taking: Reported on 04/22/2023 01/29/23   Nahser, Deloris Ping, MD  nitrofurantoin, macrocrystal-monohydrate, (MACROBID) 100 MG capsule Take 1 capsule (100 mg total) by mouth daily. Patient not taking: Reported on 04/22/2023 10/04/22   Myrlene Broker, MD      Allergies    Atenolol, Chlorhexidine, Codeine, Lactose intolerance (gi), Lodine [etodolac], Sulfa drugs cross reactors, Sulfamethoxazole, Benzonatate, Escitalopram oxalate, Metformin, Oxycodone-acetaminophen, and Pregabalin    Review of Systems   Review of Systems  Unable to perform ROS: Dementia    Physical Exam Updated Vital Signs BP (!) 146/54 (BP Location: Left Arm)   Pulse 70   Temp 97.6 F (36.4 C)   Resp 20   Ht 5\' 4"  (1.626 m)   Wt 94 kg   SpO2 94%   BMI 35.57 kg/m  Physical Exam Vitals and nursing note reviewed.  Constitutional:      General: She is not in acute distress.    Appearance: Normal appearance. She is well-developed. She is not toxic-appearing or diaphoretic.  HENT:     Head: Normocephalic and atraumatic.     Right Ear: External ear normal.     Left Ear: External ear normal.     Nose: Nose  normal.     Mouth/Throat:     Mouth: Mucous membranes are moist.  Eyes:     Extraocular Movements: Extraocular movements intact.     Conjunctiva/sclera: Conjunctivae normal.  Neck:     Comments: Cervical collar in place Cardiovascular:     Rate and Rhythm: Normal rate and regular rhythm.     Heart sounds: No murmur heard. Pulmonary:     Effort: Pulmonary effort is normal. No respiratory distress.     Breath sounds: Decreased air movement present. Decreased breath sounds present. No wheezing or rales.  Abdominal:     General: There is no distension.     Palpations: Abdomen is soft.     Tenderness: There is no abdominal tenderness.  Musculoskeletal:        General: No swelling or deformity. Normal range of motion.     Cervical back: Neck supple.  Right lower leg: No edema.     Left lower leg: No edema.  Skin:    General: Skin is warm and dry.     Coloration: Skin is not jaundiced or pale.  Neurological:     General: No focal deficit present.     Mental Status: She is alert and oriented to person, place, and time.     Cranial Nerves: No cranial nerve deficit.     Sensory: No sensory deficit.     Motor: No weakness.     Coordination: Coordination normal.  Psychiatric:        Mood and Affect: Mood normal.        Behavior: Behavior normal.        Thought Content: Thought content normal.        Judgment: Judgment normal.     ED Results / Procedures / Treatments   Labs (all labs ordered are listed, but only abnormal results are displayed) Labs Reviewed  COMPREHENSIVE METABOLIC PANEL - Abnormal; Notable for the following components:      Result Value   Chloride 96 (*)    CO2 33 (*)    Glucose, Bld 135 (*)    BUN 36 (*)    Creatinine, Ser 1.69 (*)    Total Protein 5.9 (*)    Albumin 3.4 (*)    GFR, Estimated 29 (*)    All other components within normal limits  URINALYSIS, ROUTINE W REFLEX MICROSCOPIC - Abnormal; Notable for the following components:   APPearance HAZY  (*)    Leukocytes,Ua MODERATE (*)    Bacteria, UA RARE (*)    All other components within normal limits  CK - Abnormal; Notable for the following components:   Total CK 37 (*)    All other components within normal limits  CBC WITH DIFFERENTIAL/PLATELET - Abnormal; Notable for the following components:   Hemoglobin 11.2 (*)    MCHC 29.1 (*)    RDW 17.0 (*)    All other components within normal limits  BRAIN NATRIURETIC PEPTIDE - Abnormal; Notable for the following components:   B Natriuretic Peptide 608.7 (*)    All other components within normal limits  GLUCOSE, CAPILLARY - Abnormal; Notable for the following components:   Glucose-Capillary 69 (*)    All other components within normal limits  GLUCOSE, CAPILLARY - Abnormal; Notable for the following components:   Glucose-Capillary 118 (*)    All other components within normal limits  I-STAT CHEM 8, ED - Abnormal; Notable for the following components:   Chloride 97 (*)    BUN 37 (*)    Creatinine, Ser 1.60 (*)    Glucose, Bld 128 (*)    Calcium, Ion 1.08 (*)    TCO2 33 (*)    All other components within normal limits  I-STAT VENOUS BLOOD GAS, ED - Abnormal; Notable for the following components:   pH, Ven 7.455 (*)    pO2, Ven 27 (*)    Bicarbonate 35.0 (*)    TCO2 37 (*)    Acid-Base Excess 10.0 (*)    Calcium, Ion 1.08 (*)    All other components within normal limits  TROPONIN I (HIGH SENSITIVITY) - Abnormal; Notable for the following components:   Troponin I (High Sensitivity) 34 (*)    All other components within normal limits  URINE CULTURE  URINE CULTURE  ETHANOL  CBC  BASIC METABOLIC PANEL  HEMOGLOBIN A1C  TROPONIN I (HIGH SENSITIVITY)    EKG EKG Interpretation  Date/Time:  Tuesday April 22 2023 09:49:20 EDT Ventricular Rate:  81 PR Interval:  199 QRS Duration: 132 QT Interval:  413 QTC Calculation: 384 R Axis:   -79 Text Interpretation: Sinus rhythm Ventricular bigeminy Nonspecific IVCD with LAD LVH with  secondary repolarization abnormality Confirmed by Gloris Manchester ([id number]) on 04/22/2023 9:55:44 AM  Radiology CT CHEST ABDOMEN PELVIS WO CONTRAST  Result Date: 04/22/2023 CLINICAL DATA:  Polytrauma EXAM: CT CHEST, ABDOMEN AND PELVIS WITHOUT CONTRAST TECHNIQUE: Multidetector CT imaging of the chest, abdomen and pelvis was performed following the standard protocol without IV contrast. RADIATION DOSE REDUCTION: This exam was performed according to the departmental dose-optimization program which includes automated exposure control, adjustment of the mA and/or kV according to patient size and/or use of iterative reconstruction technique. COMPARISON:  CT angiogram chest 10/05/2016 FINDINGS: CT CHEST FINDINGS Cardiovascular: Heart is mildly enlarged. Aorta is normal in size. There is no pericardial effusion. There are atherosclerotic calcifications of the aorta and coronary arteries. Mediastinum/Nodes: There are numerous prominent, but nonenlarged mediastinal lymph nodes diffusely. Visualized esophagus and thyroid gland are within normal limits. There is a small hiatal hernia. Lungs/Pleura: 2 mm right upper lobe peripheral nodule image 5/50 appears unchanged from 2017 compatible with benign etiology. There some atelectatic changes in the bilateral lower lobes. There is no focal lung consolidation, pleural effusion or pneumothorax. There some scattered ground-glass opacities throughout both lungs. Musculoskeletal: There are healed left fifth, sixth, seventh rib fractures. No acute fractures are seen. CT ABDOMEN PELVIS FINDINGS Hepatobiliary: No hepatic injury or perihepatic hematoma. Gallbladder sludge is likely present. Pancreas: 1.7 x 1.5 cm cystic lesion in the tail the pancreas. Otherwise, the pancreas is within normal limits. Spleen: No splenic injury or perisplenic hematoma. Adrenals/Urinary Tract: Adrenal glands are unremarkable. Kidneys are normal, without renal calculi, focal lesion, or hydronephrosis. Bladder is  unremarkable. Stomach/Bowel: Stomach is within normal limits. Appendix appears normal. No evidence of bowel wall thickening, distention, or inflammatory changes. There is sigmoid colon diverticulosis. Vascular/Lymphatic: Aortic atherosclerosis. No enlarged abdominal or pelvic lymph nodes. Reproductive: Status post hysterectomy. No adnexal masses. Other: There is no free fluid or free air. There is a small fat containing umbilical hernia. Musculoskeletal: L3-L5 posterior fusion hardware is present. Left hip arthroplasty is present. IMPRESSION: 1. No acute posttraumatic sequelae in the chest, abdomen or pelvis. 2. Mild cardiomegaly. 3. Scattered ground-glass opacities throughout both lungs, likely infectious/inflammatory. 4. 1.7 cm cystic lesion in the tail the pancreas. Recommend follow-up MRI/MRCP or pancreatic protocol CT in 6 months. 5. Gallbladder sludge. 6. Sigmoid colon diverticulosis. Aortic Atherosclerosis (ICD10-I70.0). Electronically Signed   By: Darliss Cheney M.D.   On: 04/22/2023 16:03   DG Pelvis Portable  Result Date: 04/22/2023 CLINICAL DATA:  Pain after trauma EXAM: PORTABLE PELVIS 1 VIEWS COMPARISON:  X-ray 03/06/2012 FINDINGS: Left hip arthroplasty identified. Press-Fit acetabular cup and cemented femoral component. No hardware failure. No fracture or dislocation. Moderate concentric joint space loss of the right hip. Sclerosis and small osteophytes along the sacroiliac joints. Fixation hardware seen along the lumbar spine at the edge of the imaging field. Overlapping cardiac leads. Mild sclerosis along the pubic symphysis. Vascular calcifications are seen. The extreme superior margin of the left hemi sacrum is clipped off the edge of the film. IMPRESSION: Degenerative changes.  Left hip arthroplasty. Electronically Signed   By: Karen Kays M.D.   On: 04/22/2023 12:25   DG Chest Port 1 View  Result Date: 04/22/2023 CLINICAL DATA:  Trauma.  Pain EXAM: PORTABLE CHEST 1  VIEW COMPARISON:  X-ray  06/13/2022 FINDINGS: Underinflation. Enlarged heart with some central vascular congestion. No pneumothorax, effusion or consolidation. Overlapping cardiac leads. Degenerative changes of the spine. Left-sided lateral rib fractures are seen. These could be chronic. IMPRESSION: Underinflation with enlarged heart with vascular congestion. Left-sided rib fractures.  These could be more chronic Electronically Signed   By: Karen Kays M.D.   On: 04/22/2023 12:24   CT HEAD WO CONTRAST  Result Date: 04/22/2023 CLINICAL DATA:  Head trauma, moderate-severe; Polytrauma, blunt EXAM: CT HEAD WITHOUT CONTRAST CT CERVICAL SPINE WITHOUT CONTRAST TECHNIQUE: Multidetector CT imaging of the head and cervical spine was performed following the standard protocol without intravenous contrast. Multiplanar CT image reconstructions of the cervical spine were also generated. RADIATION DOSE REDUCTION: This exam was performed according to the departmental dose-optimization program which includes automated exposure control, adjustment of the mA and/or kV according to patient size and/or use of iterative reconstruction technique. COMPARISON:  CT head and CT cervical spine 11/03/2020. FINDINGS: CT HEAD FINDINGS Brain: No evidence of acute infarction, hemorrhage, hydrocephalus, extra-axial collection or mass lesion/mass effect. Vascular: No hyperdense vessel. Skull: No acute fracture. Sinuses/Orbits: Clear sinuses.  No acute orbital findings. Other: No mastoid effusions. CT CERVICAL SPINE FINDINGS Alignment: Chronic ununited odontoid fracture with similar mild distraction and new (now 2 mm) of anterior subluxation of the superior fragment relative to the base of the dens. Otherwise, no significant change in alignment including mild anterolisthesis C5 on C6. Skull base and vertebrae: Chronic ununited odontoid fracture with alignment described above. No evidence of acute fracture. Soft tissues and spinal canal: No prevertebral fluid or swelling.  No visible canal hematoma. Disc levels:  Similar multilevel degenerative change. Upper chest: Mild ground-glass opacities, likely related to expiratory imaging. IMPRESSION: 1. No evidence of acute intracranial abnormality. 2. No evidence of acute fracture in the cervical spine. 3. Chronic ununited odontoid fracture with similar mild distraction and new (now 2 mm) of anterior subluxation of the superior fragment relative to the base of the dens. Electronically Signed   By: Feliberto Harts M.D.   On: 04/22/2023 11:24   CT CERVICAL SPINE WO CONTRAST  Result Date: 04/22/2023 CLINICAL DATA:  Head trauma, moderate-severe; Polytrauma, blunt EXAM: CT HEAD WITHOUT CONTRAST CT CERVICAL SPINE WITHOUT CONTRAST TECHNIQUE: Multidetector CT imaging of the head and cervical spine was performed following the standard protocol without intravenous contrast. Multiplanar CT image reconstructions of the cervical spine were also generated. RADIATION DOSE REDUCTION: This exam was performed according to the departmental dose-optimization program which includes automated exposure control, adjustment of the mA and/or kV according to patient size and/or use of iterative reconstruction technique. COMPARISON:  CT head and CT cervical spine 11/03/2020. FINDINGS: CT HEAD FINDINGS Brain: No evidence of acute infarction, hemorrhage, hydrocephalus, extra-axial collection or mass lesion/mass effect. Vascular: No hyperdense vessel. Skull: No acute fracture. Sinuses/Orbits: Clear sinuses.  No acute orbital findings. Other: No mastoid effusions. CT CERVICAL SPINE FINDINGS Alignment: Chronic ununited odontoid fracture with similar mild distraction and new (now 2 mm) of anterior subluxation of the superior fragment relative to the base of the dens. Otherwise, no significant change in alignment including mild anterolisthesis C5 on C6. Skull base and vertebrae: Chronic ununited odontoid fracture with alignment described above. No evidence of acute  fracture. Soft tissues and spinal canal: No prevertebral fluid or swelling. No visible canal hematoma. Disc levels:  Similar multilevel degenerative change. Upper chest: Mild ground-glass opacities, likely related to expiratory imaging. IMPRESSION: 1. No evidence of acute  intracranial abnormality. 2. No evidence of acute fracture in the cervical spine. 3. Chronic ununited odontoid fracture with similar mild distraction and new (now 2 mm) of anterior subluxation of the superior fragment relative to the base of the dens. Electronically Signed   By: Feliberto Harts M.D.   On: 04/22/2023 11:24    Procedures Procedures    Medications Ordered in ED Medications  furosemide (LASIX) injection 40 mg (has no administration in time range)  atorvastatin (LIPITOR) tablet 40 mg (has no administration in time range)  metoprolol tartrate (LOPRESSOR) tablet 25 mg (has no administration in time range)  insulin glargine-yfgn (SEMGLEE) injection 17 Units (has no administration in time range)  lactase (LACTAID) tablet 6,000 Units (has no administration in time range)  gabapentin (NEURONTIN) capsule 100 mg (has no administration in time range)  ipratropium-albuterol (DUONEB) 0.5-2.5 (3) MG/3ML nebulizer solution 3 mL (has no administration in time range)  fluticasone (FLONASE) 50 MCG/ACT nasal spray 2 spray (has no administration in time range)  fluticasone furoate-vilanterol (BREO ELLIPTA) 200-25 MCG/ACT 1 puff (has no administration in time range)  enoxaparin (LOVENOX) injection 40 mg (has no administration in time range)  sodium chloride flush (NS) 0.9 % injection 3 mL (has no administration in time range)  acetaminophen (TYLENOL) tablet 650 mg (has no administration in time range)    Or  acetaminophen (TYLENOL) suppository 650 mg (has no administration in time range)  guaiFENesin (MUCINEX) 12 hr tablet 600 mg (has no administration in time range)  insulin aspart (novoLOG) injection 0-5 Units (has no  administration in time range)  cefTRIAXone (ROCEPHIN) 1 g in sodium chloride 0.9 % 100 mL IVPB (has no administration in time range)  albuterol (PROVENTIL) (2.5 MG/3ML) 0.083% nebulizer solution 2.5 mg (has no administration in time range)  insulin aspart (novoLOG) injection 0-15 Units (has no administration in time range)  acidophilus (RISAQUAD) capsule 1 capsule (has no administration in time range)  ipratropium-albuterol (DUONEB) 0.5-2.5 (3) MG/3ML nebulizer solution 3 mL (3 mLs Nebulization Given 04/22/23 1034)  cefTRIAXone (ROCEPHIN) 1 g in sodium chloride 0.9 % 100 mL IVPB (1 g Intravenous New Bag/Given 04/22/23 1640)    ED Course/ Medical Decision Making/ A&P                             Medical Decision Making Amount and/or Complexity of Data Reviewed Labs: ordered. Radiology: ordered.  Risk Prescription drug management. Decision regarding hospitalization.   This patient presents to the ED for concern of fall, this involves an extensive number of treatment options, and is a complaint that carries with it a high risk of complications and morbidity.  The differential diagnosis includes syncope, hypoxia, hypercarbia, acute injuries, infection   Co morbidities that complicate the patient evaluation  DM, CHF, gout, arthritis, anemia, HTN, neuropathy, COPD, HLD   Additional history obtained:  Additional history obtained from patient's son External records from outside source obtained and reviewed including EMR   Lab Tests:  I Ordered, and personally interpreted labs.  The pertinent results include: Compensated hypercarbia on blood gas, baseline creatinine, baseline anemia, no leukocytosis, evidence of UTI on UA   Imaging Studies ordered:  I ordered imaging studies including x-ray of chest and pelvis, CT scan of head, cervical spine, chest, abdomen, pelvis I independently visualized and interpreted imaging which showed evidence of new small entry subluxation of superior  fragment of chronic odontoid fracture; scattered groundglass opacities in lungs I agree with the  radiologist interpretation   Cardiac Monitoring: / EKG:  The patient was maintained on a cardiac monitor.  I personally viewed and interpreted the cardiac monitored which showed an underlying rhythm of: Sinus rhythm  Problem List / ED Course / Critical interventions / Medication management  Patient presents from home via EMS after being found on the floor by her son.  EMS reported bradycardia with otherwise normal vital signs.  On arrival, patient is awake and alert.  She is alert and oriented x 4.  She is unable to provide any relevant history.  She is not able to describe the events leading up to her being brought to the ED.  She does currently deny any discomfort.  On lung auscultation, patient has diminished breath sounds.  SpO2 seems to be 81% on her baseline 6 L.  DuoNeb was ordered.  Patient was subsequently found to be on room air.  When she was placed on oxygen, SpO2 improved to normal range.  What EMS report his bradycardia is likely bigeminy, which was identified on EKG.  Workup was initiated.  CT scan of cervical spine showed evidence of new anterior subluxation unknown chronic ununited odontoid fracture.  I spoke with neurosurgery who feels that this is stable and nonoperative.  They advised patient wearing c-collar when she is up and around.  They also advised outpatient follow-up.  Patient and son were informed of this.  Chest x-ray showed concern of rib fractures.  This prompted CT scan of chest, abdomen, pelvis.  Lab work was notable for evidence of UTI.  Ceftriaxone was ordered.  This may be the cause of her recent confusion and increased propensity for falling.  Patient remained hemodynamically stable.  SpO2 remained normal on her home 6 L and she was actually able to be weaned down to 4 L.  She was admitted to medicine for further management. I ordered medication including DuoNeb for COPD;  ceftriaxone for UTI Reevaluation of the patient after these medicines showed that the patient improved I have reviewed the patients home medicines and have made adjustments as needed   Social Determinants of Health:  Currently lives at home with son        Final Clinical Impression(s) / ED Diagnoses Final diagnoses:  Cystitis  Fall, initial encounter    Rx / DC Orders ED Discharge Orders     None         Gloris Manchester, MD 04/22/23 1750

## 2023-04-22 NOTE — Plan of Care (Signed)
  Problem: Education: Goal: Knowledge of General Education information will improve Description Including pain rating scale, medication(s)/side effects and non-pharmacologic comfort measures Outcome: Progressing   

## 2023-04-22 NOTE — ED Triage Notes (Signed)
Per EMS report, the son reports that this is the 6th time in 3 weeks that the pt removes her 6L [place] oxygen and gets out of bed. She then gets hypoxic and she falls or lays on the floor. Today, the pt was lying on the floor. The squad was called for lift assist and transfer to ED for SNF placement. Pt has no recollection of this morning's activities. Pt has a recent diagnosis of dementia. Arrives wearing a c-collar.

## 2023-04-22 NOTE — ED Notes (Signed)
Pt O2 sats at 100% on 6L [place]. MD aware

## 2023-04-22 NOTE — ED Notes (Signed)
Pt now on 10L non-rebreather. Satting at 94%. RT to bedside for consult. Hold Bi-pap at this time.

## 2023-04-22 NOTE — Discharge Instructions (Addendum)
[name],  You are in the hospital with weakness, confusion, and urinary tract infection.  You were treated with antibiotics and improvement of your symptoms.  Recommendation is for you to go to skilled nursing facility for rehab.  Please follow-up with your primary care physician.  You were also found to have a minor fracture within your spine.  Please follow-up with the neurosurgeon and wear a collar when up and moving around.   [employer]  [facility] Address: [address] [place], [place] [address] Phone: [phone]  [facility] Address: [address], [place], [place] [address] Phone: [phone]  [facility] Address: [address] [place], [place] [address] Phone: [phone]  [facility] Address: [address] [place], [place] [address] Phone: [phone]  [facility] Address: [address] [place], [place], [place] [address] Phone: [phone]  [facility] Phone: [phone]  [facility] Address:  [address], [place], [place] [address] Phone:  [phone]  [facility] Address:  [address] Phone:  [phone]  [email or web address]  [facility] [employer] Phone: [phone]

## 2023-04-22 NOTE — ED Notes (Signed)
ED TO INPATIENT HANDOFF REPORT  ED Nurse Name and Phone #: Vernona Rieger  5  S Name/Age/Gender [name] 89 y.o. female Room/Bed: 002C/002C  Code Status   Code Status: Prior  Home/SNF/Other Home Patient oriented to: self, place, time, and situation Is this baseline? Yes   Triage Complete: Triage complete  Chief Complaint UTI (urinary tract infection) [N39.0]  Triage Note Per EMS report, the son reports that this is the 6th time in 3 weeks that the pt removes her 6L [place] oxygen and gets out of bed. She then gets hypoxic and she falls or lays on the floor. Today, the pt was lying on the floor. The squad was called for lift assist and transfer to ED for SNF placement. Pt has no recollection of this morning's activities. Pt has a recent diagnosis of dementia. Arrives wearing a c-collar.   Allergies Allergies  Allergen Reactions   Atenolol Other (See Comments)    weakness   Chlorhexidine Other (See Comments)    Unknown reaction   Codeine Nausea And Vomiting    Hydrocodone is ok   Lactose Intolerance (Gi) Diarrhea   Lodine [Etodolac] Other (See Comments)    dizziness   Sulfa Drugs Cross Reactors Nausea Only   Sulfamethoxazole Nausea And Vomiting   Benzonatate Nausea And Vomiting   Escitalopram Oxalate Other (See Comments)    mild hallucinations   Metformin Nausea And Vomiting    Reaction to IR and XL   Oxycodone-Acetaminophen Other (See Comments)    Percocet does not relieve pts' pain   Pregabalin Other (See Comments)    Unknown reaction    Level of Care/Admitting Diagnosis ED Disposition     ED Disposition  Admit   Condition  --   Comment  Hospital Area: [facility] [[id number]]  Level of Care: Telemetry Medical [104]  May place patient in observation at [facility] or [facility] if equivalent level of care is available:: No  Covid Evaluation: Asymptomatic - no recent exposure (last 10 days) testing not required  Diagnosis: UTI (urinary tract  infection) [161096]  Admitting Physician: Clydie Braun [[id number]]  Attending Physician: Clydie Braun [[id number]]          B Medical/Surgery History Past Medical History:  Diagnosis Date   Anasarca 04/2009   found to be secondary to pericardial effusion with tamponade/pericardial window done   Chronic diastolic CHF (congestive heart failure) (HCC)    CKD (chronic kidney disease), stage III (HCC)    COPD (chronic obstructive pulmonary disease) (HCC)    Coronary artery disease    a. mild-mod CAD 2004.   Dementia (HCC)    Diabetes mellitus    insulin dependent   Herpes labialis    Healing herpes labialis   Hypercholesterolemia    Hypertension    Lumbar radiculopathy    Lumbar scoliosis    Morbid obesity (HCC)    Pericardial effusion 04/2009   a. 2010 with tamponade s/p window.   PONV (postoperative nausea and vomiting)    has not had any problems since 2001   Spondylolisthesis    Spondylosis    Past Surgical History:  Procedure Laterality Date   BACK SURGERY     microdiskectomy L3-4 on L/partial facetectomy 3-4 on L/removal of synovial cyst on left L3-4   BREAST BIOPSY     left   CARDIAC CATHETERIZATION  04/25/2003   L heart cath w/coronary angiography/R femoral artery/ EF 65%/no mitral regurgitation/ angiography L main coronary artery smooth &  normal   EYE SURGERY     ophthalmoscopy/peritomy adjacent to the limbus from the 8 to 2:30 position superiorly   lumbar laminectomy     SUBXYPHOID PERICARDIAL WINDOW  04/2009   for pericardial effusion w/pericardial tamponade/sac drained w/20-French Baard drain/transesophageal echocardiogram confimed complete evacution of pericardial fluid   TOTAL HIP ARTHROPLASTY  03/06/2012   Procedure: TOTAL HIP ARTHROPLASTY;  Surgeon: Nestor Lewandowsky, MD;  Location: [facility] OR;  Service: Orthopedics;  Laterality: Left;  DEPUY/PINNACLE, SUMMIT STEM, CUP POLY & CERAMIC   VAGINAL HYSTERECTOMY       A IV Location/Drains/Wounds Patient  Lines/Drains/Airways Status     Active Line/Drains/Airways     None            Intake/Output Last 24 hours No intake or output data in the 24 hours ending 04/22/23 1531  Labs/Imaging Results for orders placed or performed during the hospital encounter of 04/22/23 (from the past 48 hour(s))  Urinalysis, Routine w reflex microscopic -Urine, Clean Catch     Status: Abnormal   Collection Time: 04/22/23  9:47 AM  Result Value Ref Range   Color, Urine YELLOW YELLOW   APPearance HAZY (A) CLEAR   Specific Gravity, Urine 1.008 1.005 - 1.030   pH 6.0 5.0 - 8.0   Glucose, UA NEGATIVE NEGATIVE mg/dL   Hgb urine dipstick NEGATIVE NEGATIVE   Bilirubin Urine NEGATIVE NEGATIVE   Ketones, ur NEGATIVE NEGATIVE mg/dL   Protein, ur NEGATIVE NEGATIVE mg/dL   Nitrite NEGATIVE NEGATIVE   Leukocytes,Ua MODERATE (A) NEGATIVE   RBC / HPF 0-5 0 - 5 RBC/hpf   WBC, UA 6-10 0 - 5 WBC/hpf   Bacteria, UA RARE (A) NONE SEEN   Squamous Epithelial / HPF 0-5 0 - 5 /HPF    Comment: Performed at [facility], [address] [place], [place] [address]  Comprehensive metabolic panel     Status: Abnormal   Collection Time: 04/22/23 10:47 AM  Result Value Ref Range   Sodium 142 135 - 145 mmol/L   Potassium 3.7 3.5 - 5.1 mmol/L   Chloride 96 (L) 98 - 111 mmol/L   CO2 33 (H) 22 - 32 mmol/L   Glucose, Bld 135 (H) 70 - 99 mg/dL    Comment: Glucose reference range applies only to samples taken after fasting for at least 8 hours.   BUN 36 (H) 8 - 23 mg/dL   Creatinine, Ser 1.91 (H) 0.44 - 1.00 mg/dL   Calcium 9.3 8.9 - 47.8 mg/dL   Total Protein 5.9 (L) 6.5 - 8.1 g/dL   Albumin 3.4 (L) 3.5 - 5.0 g/dL   AST 23 15 - 41 U/L   ALT 15 0 - 44 U/L   Alkaline Phosphatase 84 38 - 126 U/L   Total Bilirubin 0.7 0.3 - 1.2 mg/dL   GFR, Estimated 29 (L) >60 mL/min    Comment: (NOTE) Calculated using the CKD-EPI Creatinine Equation (2021)    Anion gap 13 5 - 15    Comment: Performed at [facility],  [address] [place], [place] [address]  Ethanol     Status: None   Collection Time: 04/22/23 10:47 AM  Result Value Ref Range   Alcohol, Ethyl (B) <10 <10 mg/dL    Comment: (NOTE) Lowest detectable limit for serum alcohol is 10 mg/dL.  For medical purposes only. Performed at [facility], [address], [place], [place] [address]   CK     Status: Abnormal  Collection Time: 04/22/23 10:47 AM  Result Value Ref Range   Total CK 37 (L) 38 - 234 U/L    Comment: Performed at [facility], [address], [place], [place] [address]  CBC WITH DIFFERENTIAL     Status: Abnormal   Collection Time: 04/22/23 10:47 AM  Result Value Ref Range   WBC 9.5 4.0 - 10.5 K/uL   RBC 4.25 3.87 - 5.11 MIL/uL   Hemoglobin 11.2 (L) 12.0 - 15.0 g/dL   HCT 19.1 47.8 - 29.5 %   MCV 90.6 80.0 - 100.0 fL   MCH 26.4 26.0 - 34.0 pg   MCHC 29.1 (L) 30.0 - 36.0 g/dL   RDW 62.1 (H) 30.8 - 65.7 %   Platelets 273 150 - 400 K/uL   nRBC 0.0 0.0 - 0.2 %   Neutrophils Relative % 72 %   Neutro Abs 6.8 1.7 - 7.7 K/uL   Lymphocytes Relative 15 %   Lymphs Abs 1.4 0.7 - 4.0 K/uL   Monocytes Relative 9 %   Monocytes Absolute 0.9 0.1 - 1.0 K/uL   Eosinophils Relative 4 %   Eosinophils Absolute 0.4 0.0 - 0.5 K/uL   Basophils Relative 0 %   Basophils Absolute 0.0 0.0 - 0.1 K/uL   Immature Granulocytes 0 %   Abs Immature Granulocytes 0.03 0.00 - 0.07 K/uL    Comment: Performed at [facility], [address], [place], [place] [address]  Brain natriuretic peptide     Status: Abnormal   Collection Time: 04/22/23 10:47 AM  Result Value Ref Range   B Natriuretic Peptide 608.7 (H) 0.0 - 100.0 pg/mL    Comment: Performed at [facility], [address], [place], [place] [address]  Troponin I (High Sensitivity)     Status: Abnormal   Collection Time: 04/22/23 10:47 AM  Result Value Ref Range   Troponin I (High Sensitivity) 34 (H) <18 ng/L    Comment: (NOTE) Elevated high sensitivity troponin I  (hsTnI) values and significant  changes across serial measurements may suggest ACS but many other  chronic and acute conditions are known to elevate hsTnI results.  Refer to the "Links" section for chest pain algorithms and additional  guidance. Performed at [facility], [address], [place], [place] [address]   I-Stat venous blood gas, [facility] ED, [facility], [facility])     Status: Abnormal   Collection Time: 04/22/23 10:53 AM  Result Value Ref Range   pH, Ven 7.455 (H) 7.25 - 7.43   pCO2, Ven 49.8 44 - 60 mmHg   pO2, Ven 27 (LL) 32 - 45 mmHg   Bicarbonate 35.0 (H) 20.0 - 28.0 mmol/L   TCO2 37 (H) 22 - 32 mmol/L   O2 Saturation 54 %   Acid-Base Excess 10.0 (H) 0.0 - 2.0 mmol/L   Sodium 138 135 - 145 mmol/L   Potassium 3.6 3.5 - 5.1 mmol/L   Calcium, Ion 1.08 (L) 1.15 - 1.40 mmol/L   HCT 37.0 36.0 - 46.0 %   Hemoglobin 12.6 12.0 - 15.0 g/dL   Sample type VENOUS    Comment NOTIFIED PHYSICIAN   I-Stat Chem 8, ED     Status: Abnormal   Collection Time: 04/22/23 10:54 AM  Result Value Ref Range   Sodium 138 135 - 145 mmol/L   Potassium 3.6 3.5 - 5.1 mmol/L   Chloride 97 (L) 98 - 111 mmol/L   BUN 37 (H) 8 - 23 mg/dL  Creatinine, Ser 1.60 (H) 0.44 - 1.00 mg/dL   Glucose, Bld 213 (H) 70 - 99 mg/dL    Comment: Glucose reference range applies only to samples taken after fasting for at least 8 hours.   Calcium, Ion 1.08 (L) 1.15 - 1.40 mmol/L   TCO2 33 (H) 22 - 32 mmol/L   Hemoglobin 12.6 12.0 - 15.0 g/dL   HCT 08.6 57.8 - 46.9 %   DG Pelvis Portable  Result Date: 04/22/2023 CLINICAL DATA:  Pain after trauma EXAM: PORTABLE PELVIS 1 VIEWS COMPARISON:  X-ray 03/06/2012 FINDINGS: Left hip arthroplasty identified. Press-Fit acetabular cup and cemented femoral component. No hardware failure. No fracture or dislocation. Moderate concentric joint space loss of the right hip. Sclerosis and small osteophytes along the sacroiliac joints. Fixation hardware seen along the lumbar spine at the edge of  the imaging field. Overlapping cardiac leads. Mild sclerosis along the pubic symphysis. Vascular calcifications are seen. The extreme superior margin of the left hemi sacrum is clipped off the edge of the film. IMPRESSION: Degenerative changes.  Left hip arthroplasty. Electronically Signed   By: Karen Kays M.D.   On: 04/22/2023 12:25   DG Chest Port 1 View  Result Date: 04/22/2023 CLINICAL DATA:  Trauma.  Pain EXAM: PORTABLE CHEST 1 VIEW COMPARISON:  X-ray 06/13/2022 FINDINGS: Underinflation. Enlarged heart with some central vascular congestion. No pneumothorax, effusion or consolidation. Overlapping cardiac leads. Degenerative changes of the spine. Left-sided lateral rib fractures are seen. These could be chronic. IMPRESSION: Underinflation with enlarged heart with vascular congestion. Left-sided rib fractures.  These could be more chronic Electronically Signed   By: Karen Kays M.D.   On: 04/22/2023 12:24   CT HEAD WO CONTRAST  Result Date: 04/22/2023 CLINICAL DATA:  Head trauma, moderate-severe; Polytrauma, blunt EXAM: CT HEAD WITHOUT CONTRAST CT CERVICAL SPINE WITHOUT CONTRAST TECHNIQUE: Multidetector CT imaging of the head and cervical spine was performed following the standard protocol without intravenous contrast. Multiplanar CT image reconstructions of the cervical spine were also generated. RADIATION DOSE REDUCTION: This exam was performed according to the departmental dose-optimization program which includes automated exposure control, adjustment of the mA and/or kV according to patient size and/or use of iterative reconstruction technique. COMPARISON:  CT head and CT cervical spine 11/03/2020. FINDINGS: CT HEAD FINDINGS Brain: No evidence of acute infarction, hemorrhage, hydrocephalus, extra-axial collection or mass lesion/mass effect. Vascular: No hyperdense vessel. Skull: No acute fracture. Sinuses/Orbits: Clear sinuses.  No acute orbital findings. Other: No mastoid effusions. CT CERVICAL  SPINE FINDINGS Alignment: Chronic ununited odontoid fracture with similar mild distraction and new (now 2 mm) of anterior subluxation of the superior fragment relative to the base of the dens. Otherwise, no significant change in alignment including mild anterolisthesis C5 on C6. Skull base and vertebrae: Chronic ununited odontoid fracture with alignment described above. No evidence of acute fracture. Soft tissues and spinal canal: No prevertebral fluid or swelling. No visible canal hematoma. Disc levels:  Similar multilevel degenerative change. Upper chest: Mild ground-glass opacities, likely related to expiratory imaging. IMPRESSION: 1. No evidence of acute intracranial abnormality. 2. No evidence of acute fracture in the cervical spine. 3. Chronic ununited odontoid fracture with similar mild distraction and new (now 2 mm) of anterior subluxation of the superior fragment relative to the base of the dens. Electronically Signed   By: Feliberto Harts M.D.   On: 04/22/2023 11:24   CT CERVICAL SPINE WO CONTRAST  Result Date: 04/22/2023 CLINICAL DATA:  Head trauma, moderate-severe; Polytrauma, blunt EXAM: CT  HEAD WITHOUT CONTRAST CT CERVICAL SPINE WITHOUT CONTRAST TECHNIQUE: Multidetector CT imaging of the head and cervical spine was performed following the standard protocol without intravenous contrast. Multiplanar CT image reconstructions of the cervical spine were also generated. RADIATION DOSE REDUCTION: This exam was performed according to the departmental dose-optimization program which includes automated exposure control, adjustment of the mA and/or kV according to patient size and/or use of iterative reconstruction technique. COMPARISON:  CT head and CT cervical spine 11/03/2020. FINDINGS: CT HEAD FINDINGS Brain: No evidence of acute infarction, hemorrhage, hydrocephalus, extra-axial collection or mass lesion/mass effect. Vascular: No hyperdense vessel. Skull: No acute fracture. Sinuses/Orbits: Clear  sinuses.  No acute orbital findings. Other: No mastoid effusions. CT CERVICAL SPINE FINDINGS Alignment: Chronic ununited odontoid fracture with similar mild distraction and new (now 2 mm) of anterior subluxation of the superior fragment relative to the base of the dens. Otherwise, no significant change in alignment including mild anterolisthesis C5 on C6. Skull base and vertebrae: Chronic ununited odontoid fracture with alignment described above. No evidence of acute fracture. Soft tissues and spinal canal: No prevertebral fluid or swelling. No visible canal hematoma. Disc levels:  Similar multilevel degenerative change. Upper chest: Mild ground-glass opacities, likely related to expiratory imaging. IMPRESSION: 1. No evidence of acute intracranial abnormality. 2. No evidence of acute fracture in the cervical spine. 3. Chronic ununited odontoid fracture with similar mild distraction and new (now 2 mm) of anterior subluxation of the superior fragment relative to the base of the dens. Electronically Signed   By: Feliberto Harts M.D.   On: 04/22/2023 11:24    Pending Labs Unresulted Labs (From admission, onward)     Start     Ordered   04/22/23 1432  Urine Culture  Once,   URGENT       Question:  Indication  Answer:  Altered mental status (if no other cause identified)   04/22/23 1431            Vitals/Pain Today's Vitals   04/22/23 1230 04/22/23 1245 04/22/23 1454 04/22/23 1530  BP: (!) 155/68 (!) 145/54  (!) 151/57  Pulse: (!) 36 73  77  Resp: 18 (!) 23  19  Temp:   98 F (36.7 C) 98 F (36.7 C)  TempSrc:   Oral Oral  SpO2: 100% 99%  97%  Weight:      Height:      PainSc:        Isolation Precautions No active isolations  Medications Medications  cefTRIAXone (ROCEPHIN) 1 g in sodium chloride 0.9 % 100 mL IVPB (has no administration in time range)  ipratropium-albuterol (DUONEB) 0.5-2.5 (3) MG/3ML nebulizer solution 3 mL (3 mLs Nebulization Given 04/22/23 1034)     Mobility walks with person assist-Has not ambulated in ED.     Focused Assessments Pulmonary Assessment Handoff:  Lung sounds: L Breath Sounds: Diminished R Breath Sounds: Diminished O2 Device: Nasal Cannula O2 Flow Rate (L/min): 2 L/min    R Recommendations: See Admitting Provider Note  Report given to:   Additional Notes: New diagnosis of dementia. Will occasionally take her home O2 off and try and do ADL's. She gets hypoxic and has to sit or lie down. Her son finds her and puts her O2 on and she recovers quickly.

## 2023-04-22 NOTE — ED Notes (Signed)
Resp to bedside for bi-pap placement

## 2023-04-22 NOTE — H&P (Addendum)
History and Physical    Patient: [name] OAC:[id number] DOB: [birth year] DOA: 04/22/2023 DOS: the patient was seen and examined on 04/22/2023 PCP: Myrlene Broker, MD  Patient coming from: Home  Chief Complaint:  Chief Complaint  Patient presents with   Fall   Altered Mental Status   HPI: [name] is a 89 y.o. female with medical history significant of hypertension, hyperlipidemia, diastolic CHF, COPD, chronic respiratory failure, obesity who presents after a fall.  Patient lives at home with her son and is wheelchair-bound.  Patient does not recall exactly how she ended up on the floor this morning around 6:30 AM, but she was unable to get back up.  When son found her she was on the floor without her oxygen on.  Denies any pain.  At home she is on approximately 6 L of oxygen, but son makes note that he finds her intermittently without oxygen on and usually that is also when things like this happen.  Over the last couple of weeks patient has been becoming more confused.  She reports having urinary frequency with reports of incontinence.  She had just recently completed a course of Macrobid for suspected UTI.  Son makes note that previously in the past she had become confused when she has had urinary tract infections.  She also has had an intermittent cough, but states that is not changed from baseline.  Denies having any recent fever, chest pain, nausea, vomiting, abdominal pain, or diarrhea symptoms.  Hospitalized last at [facility] back in April for generalized weakness found to have AKI along with acute on chronic CHF.  Patient's dose of torsemide has been cut back to 10 mg.  After that hospitalization she had went to [facility] for rehab and had just transitioned back to home a couple weeks ago.  Patient's son also makes note that the patient has had several falls prior to today and not had scans of her neck for which he thinks the injury is old.  In the emergency  department patient was seen to be afebrile with vital signs fairly stable.  Labs noted hemoglobin 11.2, BUN 58, creatinine 1.69, BUN 36, glucose 203, BNP 608.7, and high-sensitivity troponin 37.  Venous blood gas did not show any signs of hypercapnia.  Chest x-ray noted underinflation with enlarged heart with vascular congestion, left-sided rib fractures that were thought to be more chronic.  Review of Systems: As mentioned in the history of present illness. All other systems reviewed and are negative. Past Medical History:  Diagnosis Date   Anasarca 04/2009   found to be secondary to pericardial effusion with tamponade/pericardial window done   Chronic diastolic CHF (congestive heart failure) (HCC)    CKD (chronic kidney disease), stage III (HCC)    COPD (chronic obstructive pulmonary disease) (HCC)    Coronary artery disease    a. mild-mod CAD 2004.   Dementia (HCC)    Diabetes mellitus    insulin dependent   Herpes labialis    Healing herpes labialis   Hypercholesterolemia    Hypertension    Lumbar radiculopathy    Lumbar scoliosis    Morbid obesity (HCC)    Pericardial effusion 04/2009   a. 2010 with tamponade s/p window.   PONV (postoperative nausea and vomiting)    has not had any problems since 2001   Spondylolisthesis    Spondylosis    Past Surgical History:  Procedure Laterality Date   BACK SURGERY  microdiskectomy L3-4 on L/partial facetectomy 3-4 on L/removal of synovial cyst on left L3-4   BREAST BIOPSY     left   CARDIAC CATHETERIZATION  04/25/2003   L heart cath w/coronary angiography/R femoral artery/ EF 65%/no mitral regurgitation/ angiography L main coronary artery smooth & normal   EYE SURGERY     ophthalmoscopy/peritomy adjacent to the limbus from the 8 to 2:30 position superiorly   lumbar laminectomy     SUBXYPHOID PERICARDIAL WINDOW  04/2009   for pericardial effusion w/pericardial tamponade/sac drained w/20-French Baard drain/transesophageal  echocardiogram confimed complete evacution of pericardial fluid   TOTAL HIP ARTHROPLASTY  03/06/2012   Procedure: TOTAL HIP ARTHROPLASTY;  Surgeon: Nestor Lewandowsky, MD;  Location: [facility] OR;  Service: Orthopedics;  Laterality: Left;  DEPUY/PINNACLE, SUMMIT STEM, CUP POLY & CERAMIC   VAGINAL HYSTERECTOMY     Social History:  reports that she has never smoked. She has never used smokeless tobacco. She reports that she does not drink alcohol and does not use drugs.  Allergies  Allergen Reactions   Atenolol Other (See Comments)    weakness   Chlorhexidine Other (See Comments)    Unknown reaction   Codeine Nausea And Vomiting    Hydrocodone is ok   Lactose Intolerance (Gi) Diarrhea   Lodine [Etodolac] Other (See Comments)    dizziness   Sulfa Drugs Cross Reactors Nausea Only   Sulfamethoxazole Nausea And Vomiting   Benzonatate Nausea And Vomiting   Escitalopram Oxalate Other (See Comments)    mild hallucinations   Metformin Nausea And Vomiting    Reaction to IR and XL   Oxycodone-Acetaminophen Other (See Comments)    Percocet does not relieve pts' pain   Pregabalin Other (See Comments)    Unknown reaction    Family History  Problem Relation Age of Onset   Heart attack Father    Hypertension Father    Stroke Mother    Angina Mother    Coronary artery disease Other     Prior to Admission medications   Medication Sig Start Date End Date Taking? Authorizing Provider  acetaminophen (TYLENOL) 500 MG tablet Take 500 mg by mouth 2 (two) times daily as needed for mild pain or headache.   Yes [provider]  albuterol (VENTOLIN HFA) 108 (90 Base) MCG/ACT inhaler Inhale 2 puffs into the lungs every 6 (six) hours as needed for wheezing or shortness of breath. 11/01/21  Yes Corwin Levins, MD  allopurinol (ZYLOPRIM) 100 MG tablet TAKE 1 TABLET BY MOUTH EVERY DAY Patient taking differently: Take 100 mg by mouth daily. 09/19/22  Yes Myrlene Broker, MD  ALPRAZolam Prudy Feeler) 0.25 MG  tablet TAKE 1 TABLET BY MOUTH AT BEDTIME AS NEEDED FOR ANXIETY Patient taking differently: Take 0.25 mg by mouth at bedtime. 11/05/22  Yes Myrlene Broker, MD  aspirin EC 81 MG tablet Take 81 mg by mouth every morning.    Yes [provider]  atorvastatin (LIPITOR) 40 MG tablet TAKE 1 TABLET ONCE A DAY FOR CHOLESTEROL Patient taking differently: Take 40 mg by mouth at bedtime. 09/19/22  Yes Myrlene Broker, MD  diclofenac Sodium (VOLTAREN) 1 % GEL Apply 4 g topically 4 (four) times daily. To affected joint. Patient taking differently: Apply 4 g topically 4 (four) times daily as needed (Pain). To affected joint. 06/27/20  Yes Rodolph Bong, MD  fluticasone (FLONASE) 50 MCG/ACT nasal spray Place 2 sprays into both nostrils daily. Patient taking differently: Place 2 sprays into both  nostrils at bedtime. 10/15/16  Yes Tat, Onalee Hua, MD  fluticasone furoate-vilanterol (BREO ELLIPTA) 200-25 MCG/ACT AEPB TAKE 1 PUFF BY MOUTH EVERY DAY Patient taking differently: Inhale 1 puff into the lungs daily. 02/19/23  Yes Myrlene Broker, MD  gabapentin (NEURONTIN) 100 MG capsule TAKE 1 CAPSULE BY MOUTH TWICE A DAY 09/19/22  Yes Myrlene Broker, MD  insulin aspart (NOVOLOG) 100 UNIT/ML injection Inject 0-17 Units into the skin 3 (three) times daily with meals. Per sliding scale.   Yes [provider]  insulin glargine (LANTUS) 100 UNIT/ML injection Inject 0.25 mLs (25 Units total) into the skin at bedtime. Patient taking differently: Inject 17 Units into the skin at bedtime. 12/24/19  Yes Jae Dire, MD  ipratropium-albuterol (DUONEB) 0.5-2.5 (3) MG/3ML SOLN Take 3 mLs by nebulization every 6 (six) hours as needed. Patient taking differently: Take 3 mLs by nebulization 2 (two) times daily. 05/21/22  Yes Elgergawy, Leana Roe, MD  LACTASE PO Take 1 tablet by mouth every morning.   Yes [provider]  metoprolol tartrate (LOPRESSOR) 25 MG tablet TAKE 1 TABLET BY MOUTH TWICE  A DAY 09/19/22  Yes Myrlene Broker, MD  Multiple Vitamin (MULTIVITAMIN WITH MINERALS) TABS tablet Take 1 tablet by mouth every morning.   Yes [provider]  OXYGEN Inhale 5-6 L into the lungs continuous.   Yes [provider]  Polyethyl Glycol-Propyl Glycol 0.4-0.3 % SOLN Place 2 drops into both eyes daily as needed (dry eyes). Systane   Yes [provider]  Probiotic Product (PROBIOTIC DAILY PO) Take 1 tablet by mouth every morning.   Yes [provider]  torsemide (DEMADEX) 10 MG tablet Take 3 tablets (30mg ) by mouth every other day Patient taking differently: Take 10-30 mg by mouth daily. Dosage depending on feet swelling. 09/23/22  Yes Nahser, Deloris Ping, MD  Vitamin D, Ergocalciferol, (DRISDOL) 1.25 MG (50000 UNIT) CAPS capsule TAKE 1 CAPSULE (50,000 UNITS TOTAL) BY MOUTH EVERY FRIDAY. Patient taking differently: Take 50,000 Units by mouth every 7 (seven) days. Friday 12/23/22  Yes Myrlene Broker, MD  eplerenone (INSPRA) 25 MG tablet Take 1 tablet (25 mg total) by mouth daily. Patient not taking: Reported on 04/22/2023 09/23/22   Nahser, Deloris Ping, MD  hydrALAZINE (APRESOLINE) 25 MG tablet TAKE 1 TABLET BY MOUTH TWICE A DAY Patient not taking: Reported on 04/22/2023 01/29/23   Nahser, Deloris Ping, MD  nitrofurantoin, macrocrystal-monohydrate, (MACROBID) 100 MG capsule Take 1 capsule (100 mg total) by mouth daily. Patient not taking: Reported on 04/22/2023 10/04/22   Myrlene Broker, MD    Physical Exam: Vitals:   04/22/23 1215 04/22/23 1230 04/22/23 1245 04/22/23 1454  BP: (!) 150/72 (!) 155/68 (!) 145/54   Pulse: 70 (!) 36 73   Resp: 13 18 (!) 23   Temp:    98 F (36.7 C)  TempSrc:    Oral  SpO2: 100% 100% 99%   Weight:      Height:        Constitutional: Elderly female who appears to be in Eyes: PERRL, lids and conjunctivae normal ENMT: Mucous membranes are moist. Posterior pharynx clear of any exudate or lesions  Neck: normal,  supple  Respiratory: Decreased overall aeration with some crackles noted in the lower lung bases.  Patient on 4 L of oxygen with O2 saturations maintained. Cardiovascular: Regular rate and rhythm, no murmurs / rubs / gallops.  At least +2 pitting lower extremity edema.   Abdomen: no tenderness, no  masses palpated. Bowel sounds positive.  Musculoskeletal: no clubbing / cyanosis. No joint deformity upper and lower extremities. Good ROM, no contractures. Skin: no rashes, lesions, ulcers. No induration Neurologic: CN 2-12 grossly intact.  Able to move all extremities. Psychiatric: Patient alert and oriented to person, place, and circumstance. Data Reviewed:  EKG reveals sinus rhythm 81 bpm in ventricular bigeminy.  Reviewed labs, imaging, and pertinent records as noted above in HPI.  Assessment and Plan:  Urinary tract infection Prior to arrival.  She reports urinary frequency and incontinence.  Son makes note she had recently been treated for urinary tract infection with Macrobid.  Urinalysis noted moderate leukocytes with rare bacteria, and 6-10 WBCs. -Admit to a telemetry bed -Check urine culture -Continue antibiotics of Rocephin  Acute metabolic encephalopathy Patient's son notes that she has been more confused over the last couple weeks.  CT scan of the head did not note any acute intercranial abnormality.  Review of records note previous hospitalizations related with hypercapnia, but not noted on initial blood gas.  Possibly secondary to untreated urinary tract infection and/or patient being off oxygen. -Neurochecks  Heart failure with preserved EF Acute on chronic.  Patient with at least +2 pitting edema of the lower extremities.  BNP elevated at 608.7.  Had been on torsemide 10 mg daily since last charge.  Last echocardiogram noted EF to be 60 to 65% with grade 2 diastolic dysfunction back in 05/2022. -Strict I&O's and daily weights -Lasix 40 mg IV twice daily x 2 doses -Continue  beta-blocker -Reassess need for continued IV diuresis thereafter  COPD, without acute exacerbation Chronic respiratory failure with hypoxia At baseline patient is on 5-6 L of oxygen at home.  Initial venous blood gas did not note signs of hypercapnia.  She had been placed on some ventilator type device at home for which she is using it roughly 3 hours a day. -Continue nasal cannula oxygen maintain O2 saturation greater than 90% -Continue Breo -DuoNebs twice daily  Elevated troponin High-sensitivity troponins were essentially flat at 34->35.  Patient denied any complaints of chest pain.  Possibly secondary to demand. -Continue to monitor  Essential hypertension Initial blood pressures elevated up to 156/85. -Continue metoprolol as tolerated  Odontoid fracture Acute on chronic.  CT imaging noted chronic ununited undone toyed fracture with similar mild distraction and new 2 mm of anterior subluxation of the superior fragment relative to the base of the dens.  It was reported that this was discussed with neurosurgery, but no further workup recommended.  Diabetes mellitus type 2, with long-term use of insulin Last hemoglobin A1c was 7. -Hypoglycemic protocols -Pharmacy substitution of Semglee 17 units -CBGs before every meal with moderate SSI -Adjust insulin regimen as needed  Normocytic anemia Stable.  Hemoglobin 11.2.  Patient denies any reports of bleeding. -Continue to monitor  Chronic kidney disease stage IIIb Creatinine 1.69 which appears around patient's baseline that ranges from 1.6-1.9 -Continue to monitor with diuresis  Impaired mobility Patient is wheelchair-bound and with recent fall son questions his ability to care for the patient at home. -PT/OT to eval and treat -Transitions of care consulted for possible need  DVT prophylaxis: Lovenox Advance Care Planning:   Code Status: DNR   Consults: none  Family Communication: Son updated at bedside  Severity of  Illness: The appropriate patient status for this patient is INPATIENT. Inpatient status is judged to be reasonable and necessary in order to provide the required intensity of service to ensure the patient's safety.  The patient's presenting symptoms, physical exam findings, and initial radiographic and laboratory data in the context of their chronic comorbidities is felt to place them at high risk for further clinical deterioration. Furthermore, it is not anticipated that the patient will be medically stable for discharge from the hospital within 2 midnights of admission.   * I certify that at the point of admission it is my clinical judgment that the patient will require inpatient hospital care spanning beyond 2 midnights from the point of admission due to high intensity of service, high risk for further deterioration and high frequency of surveillance required.*  Author: Clydie Braun, MD 04/22/2023 3:22 PM  For on call review [email or web address].

## 2023-04-22 NOTE — ED Notes (Signed)
Pt on 15L non-rebreather and satting at 82% best reading. Dr Durwin Nora made aware.

## 2023-04-22 NOTE — Progress Notes (Signed)
CSW spoke with patients son who stated he wants to see if his mother will be admitted before making a decision. Patients son stated he will either take patient home and start the SNF process from there or he will ask CSW to start the SNF process. Patients son also interested in a home aide. Resources have been added to patients AVS. Patient recently discharged from [facility]. Patient is on the waiting list for long term care at [facility].

## 2023-04-23 DIAGNOSIS — N1832 Chronic kidney disease, stage 3b: Secondary | ICD-10-CM | POA: Diagnosis not present

## 2023-04-23 DIAGNOSIS — J9611 Chronic respiratory failure with hypoxia: Secondary | ICD-10-CM | POA: Diagnosis not present

## 2023-04-23 DIAGNOSIS — I5032 Chronic diastolic (congestive) heart failure: Secondary | ICD-10-CM | POA: Diagnosis not present

## 2023-04-23 DIAGNOSIS — N3000 Acute cystitis without hematuria: Secondary | ICD-10-CM | POA: Diagnosis not present

## 2023-04-23 LAB — BASIC METABOLIC PANEL
Anion gap: 14 (ref 5–15)
BUN: 33 mg/dL — ABNORMAL HIGH (ref 8–23)
CO2: 27 mmol/L (ref 22–32)
Calcium: 8.6 mg/dL — ABNORMAL LOW (ref 8.9–10.3)
Chloride: 97 mmol/L — ABNORMAL LOW (ref 98–111)
Creatinine, Ser: 1.48 mg/dL — ABNORMAL HIGH (ref 0.44–1.00)
GFR, Estimated: 34 mL/min — ABNORMAL LOW (ref >60)
Glucose, Bld: 119 mg/dL — ABNORMAL HIGH (ref 70–99)
Potassium: 4.2 mmol/L (ref 3.5–5.1)
Sodium: 138 mmol/L (ref 135–145)

## 2023-04-23 LAB — URINE CULTURE

## 2023-04-23 LAB — CBC
HCT: 37.6 % (ref 36.0–46.0)
Hemoglobin: 11 g/dL — ABNORMAL LOW (ref 12.0–15.0)
MCH: 27.6 pg (ref 26.0–34.0)
MCHC: 29.3 g/dL — ABNORMAL LOW (ref 30.0–36.0)
MCV: 94.5 fL (ref 80.0–100.0)
Platelets: 241 10*3/uL (ref 150–400)
RBC: 3.98 MIL/uL (ref 3.87–5.11)
RDW: 17.3 % — ABNORMAL HIGH (ref 11.5–15.5)
WBC: 7.6 10*3/uL (ref 4.0–10.5)
nRBC: 0 % (ref 0.0–0.2)

## 2023-04-23 LAB — GLUCOSE, CAPILLARY
Glucose-Capillary: 74 mg/dL (ref 70–99)
Glucose-Capillary: 168 mg/dL — ABNORMAL HIGH (ref 70–99)
Glucose-Capillary: 108 mg/dL — ABNORMAL HIGH (ref 70–99)
Glucose-Capillary: 126 mg/dL — ABNORMAL HIGH (ref 70–99)

## 2023-04-23 MED ORDER — ALPRAZOLAM 0.25 MG PO TABS
0.2500 mg | ORAL_TABLET | Freq: Every evening | ORAL | Status: DC | PRN
  Administered 2023-04-23 – 2023-04-24 (×2): 0.25 mg via ORAL
  Filled 2023-04-23 (×2): qty 1

## 2023-04-23 MED ORDER — ALPRAZOLAM 0.25 MG PO TABS
0.2500 mg | ORAL_TABLET | Freq: Once | ORAL | Status: AC
  Administered 2023-04-23: 0.25 mg via ORAL
  Filled 2023-04-23: qty 1

## 2023-04-23 NOTE — Evaluation (Signed)
Occupational Therapy Evaluation Patient Details Name: [name] MRN: [id number] DOB: 10/09/1931 Today's Date: 04/23/2023   History of Present Illness 89 y.o. female presenting via EMS from home with son after being found down with O2 off. PMH significant for DM, CHF, gout, arthritis, anemia, HTN, neuropathy, COPD, HLD, on 6L supplemental O2, recently diagnosed with vascular dementia.   Clinical Impression   PTA, pt lived with son who assisted with transfers, ADL, and IADL. Per son, pt with multiple recent admissions due to doffing oxygen and forgetting to replace, and falls during hours son is sleeping. Upon eval, pt with intermittent confusion, requiring set-up A for UB ADL and mod A for LB ADL. Son reports that the walker they have is not steady and pt frequently experiences significant shaking (not observed during session). Pt and son both report increased caregiver burden recently with pt experiencing greater difficulty with transfers and multiple recent hospitalizations. Recommending inpatient follow up therapy after discharge <3hours/day. Will continue to follow acutely.      Recommendations for follow up therapy are one component of a multi-disciplinary discharge planning process, led by the attending physician.  Recommendations may be updated based on patient status, additional functional criteria and insurance authorization.   Assistance Recommended at Discharge Frequent or constant Supervision/Assistance  Patient can return home with the following A little help with walking and/or transfers;A little help with bathing/dressing/bathroom;Assistance with cooking/housework;Direct supervision/assist for medications management;Direct supervision/assist for financial management;Assist for transportation;Help with stairs or ramp for entrance    Functional Status Assessment  Patient has had a recent decline in their functional status and demonstrates the ability to make significant improvements  in function in a reasonable and predictable amount of time.  Equipment Recommendations  Other (comment) (defer)    Recommendations for Other Services       Precautions / Restrictions Restrictions Weight Bearing Restrictions: No      Mobility Bed Mobility Overal bed mobility: Needs Assistance Bed Mobility: Supine to Sit, Sit to Supine     Supine to sit: Min assist Sit to supine: Min guard   General bed mobility comments: min HHA for truncal elavation. Pt initially reporting she cannot get OOB but HHA more for cue than true assist.    Transfers Overall transfer level: Needs assistance Equipment used: Rolling walker (2 wheels) Transfers: Sit to/from Stand, Bed to chair/wheelchair/BSC Sit to Stand: Min guard, Min assist     Step pivot transfers: Min guard     General transfer comment: 1-2 cues for safety. Pt overall demos good hand placement. Can be quick to move at times.      Balance Overall balance assessment: Mild deficits observed, not formally tested Sitting-balance support: Single extremity supported, Bilateral upper extremity supported, No upper extremity supported, Feet supported Sitting balance-Leahy Scale: Fair     Standing balance support: Bilateral upper extremity supported, During functional activity Standing balance-Leahy Scale: Poor                             ADL either performed or assessed with clinical judgement   ADL Overall ADL's : Needs assistance/impaired Eating/Feeding: Sitting;Modified independent   Grooming: Set up;Sitting   Upper Body Bathing: Set up;Sitting   Lower Body Bathing: Moderate assistance;Sit to/from stand   Upper Body Dressing : Set up;Sitting   Lower Body Dressing: Moderate assistance;Sit to/from stand   Toilet Transfer: Min guard;Minimal assistance;Stand-pivot;BSC/3in1;Rolling walker (2 wheels) Toilet Transfer Details (indicate cue type and reason): Min  A for initial rise progressing to min guard A          Functional mobility during ADLs: Min guard;Rolling walker (2 wheels) General ADL Comments: ~80ft this session     Vision Baseline Vision/History: 1 Wears glasses Ability to See in Adequate Light: 0 Adequate Patient Visual Report: No change from baseline Additional Comments: Pt with trifocals. Pt able to visually locate therapist and read therapist title on name badge. Suspect some decr peripheral vision. Pt denies any new changes.     Perception     Praxis      Pertinent Vitals/Pain Pain Assessment Pain Assessment: No/denies pain     Hand Dominance Right   Extremity/Trunk Assessment Upper Extremity Assessment Upper Extremity Assessment: Overall WFL for tasks assessed (WFL given age)   Lower Extremity Assessment Lower Extremity Assessment: Defer to PT evaluation       Communication Communication Communication: No difficulties   Cognition Arousal/Alertness: Awake/alert Behavior During Therapy: WFL for tasks assessed/performed Overall Cognitive Status: History of cognitive impairments - at baseline                                 General Comments: Son present reporting vascular dementia at baseline. Reporting more recently over past month, pt has experienced sun downing intermittently around 4:30pm. Pt following all commands and with fair safety during transfers. Oriented to self, location, year. Decr memory and intermittent confusion throughout session     General Comments  son with max  concerns in regard to repeat hospitalizations with pt doffing O2, desatting and falling while he is sleeping. Pt would like to speak with case manager due to needing assist navigating medicare coverage of rehabilitation. OT has reached out to [facility]    Exercises     Shoulder Instructions      Home Living Family/patient expects to be discharged to:: Skilled nursing facility Living Arrangements: Children   Type of Home: House Home Access: Ramped entrance     Home  Layout: Two level;Able to live on main level with bedroom/bathroom     Bathroom Shower/Tub: Producer, television/film/video: Handicapped height Bathroom Accessibility: Yes How Accessible: Accessible via wheelchair ("bathroom is real small but is now able to be accessed by wheelchair) Home Equipment: Wheelchair - manual;Grab bars - tub/shower;Rolling Environmental consultant (2 wheels);Hand held shower head;Adaptive equipment;Shower seat (shower seat with back) Adaptive Equipment: Reacher;Long-handled sponge;Other (Comment) (dressing stick)        Prior Functioning/Environment Prior Level of Function : Needs assist             Mobility Comments: Son assists with transfers recently (1 year) to and from wheelchair ADLs Comments: has not used walker in a long time; son assists with all transfers 24 hours/day assist; trouble when son is sleeping due to pt will doff her O2 and then attempt transfers and forgets to re-don O2. Son assisting with dressing over the past several months as well as home making for several years.        OT Problem List: Cardiopulmonary status limiting activity;Impaired balance (sitting and/or standing);Decreased activity tolerance;Decreased cognition;Decreased safety awareness;Decreased knowledge of use of DME or AE      OT Treatment/Interventions: Self-care/ADL training;Therapeutic exercise;DME and/or AE instruction;Balance training;Patient/family education;Therapeutic activities;Cognitive remediation/compensation    OT Goals(Current goals can be found in the care plan section) Acute Rehab OT Goals Patient Stated Goal: get better OT Goal Formulation: With patient Time For Goal Achievement:  05/07/23 Potential to Achieve Goals: Good  OT Frequency: Min 2X/week    Co-evaluation              AM-PAC OT "6 Clicks" Daily Activity     Outcome Measure Help from another person eating meals?: None Help from another person taking care of personal grooming?: A Little Help from  another person toileting, which includes using toliet, bedpan, or urinal?: A Little Help from another person bathing (including washing, rinsing, drying)?: A Lot Help from another person to put on and taking off regular upper body clothing?: A Little Help from another person to put on and taking off regular lower body clothing?: A Lot 6 Click Score: 17   End of Session Equipment Utilized During Treatment: Gait belt;Rolling walker (2 wheels);Oxygen;Other (comment) (5L; BSC) Nurse Communication: Mobility status  Activity Tolerance: Patient tolerated treatment well Patient left: in bed;with call bell/phone within reach;with bed alarm set;with family/visitor present  OT Visit Diagnosis: Unsteadiness on feet (R26.81);Muscle weakness (generalized) (M62.81);Other abnormalities of gait and mobility (R26.89);Other symptoms and signs involving cognitive function                Time: 1610-9604 OT Time Calculation (min): 39 min Charges:  OT General Charges $OT Visit: 1 Visit OT Evaluation $OT Eval Low Complexity: 1 Low OT Treatments $Self Care/Home Management : 23-37 mins  Tyler Deis, OTR/L [facility] Acute Rehabilitation Office: [phone]  Myrla Halsted 04/23/2023, 11:19 AM

## 2023-04-23 NOTE — Progress Notes (Signed)
   04/23/23 1115  Spiritual Encounters  Type of Visit Initial  Care provided to: Pt and family  Conversation partners present during encounter Nurse;Physician  Referral source Clinical staff  OnCall Visit No  Spiritual Framework  Presenting Themes Meaning/purpose/sources of inspiration;Caregiving needs;Coping tools  Community/Connection Family  Interventions  Spiritual Care Interventions Made Compassionate presence;Reflective listening;Narrative/life review  Intervention Outcomes  Outcomes Connection to spiritual care;Awareness around self/spiritual resourses;Connected to spiritual community   Responded for request for prayer. Patient was welcoming though not aware chaplain was called. Son, Casimiro Needle stated he made the request. Son dismissed himself stating he wanted to give mother time to talk with chaplain and it gives him a chance to take a break. Patient expressed concerns about the possibility of going to a nursing home. She was very concerned about her son who has been caring for her for the last six years. She and he have to make some difficult decisions about their future. Son stated he was familiar with Palliative care and wanted more information as to whether it could benefit him and his mother. Son states he only gets around four hours of sleep and lacks participation from other family members. Son asked to speak with chaplain after chaplain concluded with patient.

## 2023-04-23 NOTE — Progress Notes (Signed)
Heart Failure Navigator Progress Note  Assessed for Heart & Vascular TOC clinic readiness.  Patient does not meet criteria due to EF 60-65%, per note recent diagnosis of dementia. .   Navigator will sign off at this time.   Rhae Hammock, BSN, Scientist, clinical (histocompatibility and immunogenetics) Only

## 2023-04-23 NOTE — Progress Notes (Addendum)
PROGRESS NOTE    DAPHINE LARANCE  AOZ:[id number] DOB: 1931/10/15 DOA: 04/22/2023 PCP: Myrlene Broker, MD   Brief Narrative: VONETTA BOLE is a 89 y.o. female with a history of hypertension, hyperlipidemia, diastolic heart failure, COPD, chronic respiratory failure, obesity.  Patient presented secondary to progressive confusion with associated urinary frequency and incontinence.  Concern for possible urinary tract infection.  Patient started on empiric ceftriaxone and urine culture obtained.   Assessment/Plan:  UTI Concern on admission.  Urinalysis significant for rare bacteria with moderate leukocytes.  Urine culture obtained on admission was significant for multiple species.  Patient started empirically on ceftriaxone. -Repeat urine culture -Continue ceftriaxone  Acute metabolic encephalopathy Per son, patient is at baseline.  Son attributes patient's confusion to chronic hypoxia.  Concern for possible UTI etiology for confusion as well.  Chronic heart failure with preserved EF Stable.  COPD Stable.  No exacerbation. -Continue Breo Ellipta and albuterol  Chronic respiratory failure with hypoxia Patient currently stable on home 5 to 6 L of supplemental oxygen.  History of respiratory failure with hypercapnia Patient was previously on NIV as an outpatient for which her son states that they have discontinued secondary to patient tolerance.  VBG obtained this admission with normal p0CO2  Elevated troponin Mildly elevated. Negative delta. No chest pain.  Primary hypertension -Continue metoprolol titrate  Odontoid fracture EDP discussed with neurosurgery prior to admission with recommendation for no management as it is stable and nonoperative. Recommendations for C-collar when up.  Diabetes mellitus type 2 Well-controlled for age.  Hemoglobin A1c of 7.0%. -Continue Semglee 17 units and sliding scale insulin  Anxiety -Continue home Xanax as needed  Anemia Mild.   Stable.  CKD stage IIIb Patient does not meet criteria for AKI. Stable.  Impaired mobility Pressure, patient is wheelchair-bound but transfers.  PT and OT consulted for evaluation.  Son is wanting patient to discharge to SNF.  DVT prophylaxis: Lovenox Code Status:   Code Status: DNR Family Communication: Son at bedside Disposition Plan: Discharge pending urine culture results and outpatient antibiotic regimen   Consultants:  None  Procedures:  None  Antimicrobials: Ceftriaxone IV    Subjective: Patient reports no concerns today.  Feels well.  Does not feel confused.  Per son, patient appears to be at/near baseline.  Objective: BP (!) 147/61   Pulse 66   Temp 98.2 F (36.8 C)   Resp 18   Ht 5\' 4"  (1.626 m)   Wt 94 kg   SpO2 98%   BMI 35.57 kg/m   Examination:  General exam: Appears calm and comfortable Respiratory system: Clear to auscultation. Respiratory effort normal. Cardiovascular system: S1 & S2 heard, RRR. Gastrointestinal system: Abdomen is nondistended, soft and nontender. Normal bowel sounds heard. Central nervous system: Alert. No focal neurological deficits. Musculoskeletal: No edema. No calf tenderness Skin: No cyanosis. No rashes   Data Reviewed: I have personally reviewed following labs and imaging studies   Last CBC Lab Results  Component Value Date   WBC 7.6 04/23/2023   HGB 11.0 (L) 04/23/2023   HCT 37.6 04/23/2023   MCV 94.5 04/23/2023   MCH 27.6 04/23/2023   RDW 17.3 (H) 04/23/2023   PLT 241 04/23/2023     Last metabolic panel Lab Results  Component Value Date   GLUCOSE 119 (H) 04/23/2023   NA 138 04/23/2023   K 4.2 04/23/2023   CL 97 (L) 04/23/2023   CO2 27 04/23/2023   BUN 33 (H) 04/23/2023  CREATININE 1.48 (H) 04/23/2023   GFRNONAA 34 (L) 04/23/2023   CALCIUM 8.6 (L) 04/23/2023   PHOS 3.8 05/19/2022   PROT 5.9 (L) 04/22/2023   ALBUMIN 3.4 (L) 04/22/2023   LABGLOB 2.0 10/11/2022   AGRATIO 2.1 10/11/2022   BILITOT  0.7 04/22/2023   ALKPHOS 84 04/22/2023   AST 23 04/22/2023   ALT 15 04/22/2023   ANIONGAP 14 04/23/2023     Creatinine Clearance: Estimated Creatinine Clearance: 28.6 mL/min (A) (by C-G formula based on SCr of 1.48 mg/dL (H)).  No results found for this or any previous visit (from the past 240 hour(s)).    Radiology Studies: CT CHEST ABDOMEN PELVIS WO CONTRAST  Result Date: 04/22/2023 CLINICAL DATA:  Polytrauma EXAM: CT CHEST, ABDOMEN AND PELVIS WITHOUT CONTRAST TECHNIQUE: Multidetector CT imaging of the chest, abdomen and pelvis was performed following the standard protocol without IV contrast. RADIATION DOSE REDUCTION: This exam was performed according to the departmental dose-optimization program which includes automated exposure control, adjustment of the mA and/or kV according to patient size and/or use of iterative reconstruction technique. COMPARISON:  CT angiogram chest 10/05/2016 FINDINGS: CT CHEST FINDINGS Cardiovascular: Heart is mildly enlarged. Aorta is normal in size. There is no pericardial effusion. There are atherosclerotic calcifications of the aorta and coronary arteries. Mediastinum/Nodes: There are numerous prominent, but nonenlarged mediastinal lymph nodes diffusely. Visualized esophagus and thyroid gland are within normal limits. There is a small hiatal hernia. Lungs/Pleura: 2 mm right upper lobe peripheral nodule image 5/50 appears unchanged from 2017 compatible with benign etiology. There some atelectatic changes in the bilateral lower lobes. There is no focal lung consolidation, pleural effusion or pneumothorax. There some scattered ground-glass opacities throughout both lungs. Musculoskeletal: There are healed left fifth, sixth, seventh rib fractures. No acute fractures are seen. CT ABDOMEN PELVIS FINDINGS Hepatobiliary: No hepatic injury or perihepatic hematoma. Gallbladder sludge is likely present. Pancreas: 1.7 x 1.5 cm cystic lesion in the tail the pancreas. Otherwise,  the pancreas is within normal limits. Spleen: No splenic injury or perisplenic hematoma. Adrenals/Urinary Tract: Adrenal glands are unremarkable. Kidneys are normal, without renal calculi, focal lesion, or hydronephrosis. Bladder is unremarkable. Stomach/Bowel: Stomach is within normal limits. Appendix appears normal. No evidence of bowel wall thickening, distention, or inflammatory changes. There is sigmoid colon diverticulosis. Vascular/Lymphatic: Aortic atherosclerosis. No enlarged abdominal or pelvic lymph nodes. Reproductive: Status post hysterectomy. No adnexal masses. Other: There is no free fluid or free air. There is a small fat containing umbilical hernia. Musculoskeletal: L3-L5 posterior fusion hardware is present. Left hip arthroplasty is present. IMPRESSION: 1. No acute posttraumatic sequelae in the chest, abdomen or pelvis. 2. Mild cardiomegaly. 3. Scattered ground-glass opacities throughout both lungs, likely infectious/inflammatory. 4. 1.7 cm cystic lesion in the tail the pancreas. Recommend follow-up MRI/MRCP or pancreatic protocol CT in 6 months. 5. Gallbladder sludge. 6. Sigmoid colon diverticulosis. Aortic Atherosclerosis (ICD10-I70.0). Electronically Signed   By: Darliss Cheney M.D.   On: 04/22/2023 16:03   DG Pelvis Portable  Result Date: 04/22/2023 CLINICAL DATA:  Pain after trauma EXAM: PORTABLE PELVIS 1 VIEWS COMPARISON:  X-ray 03/06/2012 FINDINGS: Left hip arthroplasty identified. Press-Fit acetabular cup and cemented femoral component. No hardware failure. No fracture or dislocation. Moderate concentric joint space loss of the right hip. Sclerosis and small osteophytes along the sacroiliac joints. Fixation hardware seen along the lumbar spine at the edge of the imaging field. Overlapping cardiac leads. Mild sclerosis along the pubic symphysis. Vascular calcifications are seen. The extreme superior margin of the  left hemi sacrum is clipped off the edge of the film. IMPRESSION:  Degenerative changes.  Left hip arthroplasty. Electronically Signed   By: Karen Kays M.D.   On: 04/22/2023 12:25   DG Chest Port 1 View  Result Date: 04/22/2023 CLINICAL DATA:  Trauma.  Pain EXAM: PORTABLE CHEST 1 VIEW COMPARISON:  X-ray 06/13/2022 FINDINGS: Underinflation. Enlarged heart with some central vascular congestion. No pneumothorax, effusion or consolidation. Overlapping cardiac leads. Degenerative changes of the spine. Left-sided lateral rib fractures are seen. These could be chronic. IMPRESSION: Underinflation with enlarged heart with vascular congestion. Left-sided rib fractures.  These could be more chronic Electronically Signed   By: Karen Kays M.D.   On: 04/22/2023 12:24   CT HEAD WO CONTRAST  Result Date: 04/22/2023 CLINICAL DATA:  Head trauma, moderate-severe; Polytrauma, blunt EXAM: CT HEAD WITHOUT CONTRAST CT CERVICAL SPINE WITHOUT CONTRAST TECHNIQUE: Multidetector CT imaging of the head and cervical spine was performed following the standard protocol without intravenous contrast. Multiplanar CT image reconstructions of the cervical spine were also generated. RADIATION DOSE REDUCTION: This exam was performed according to the departmental dose-optimization program which includes automated exposure control, adjustment of the mA and/or kV according to patient size and/or use of iterative reconstruction technique. COMPARISON:  CT head and CT cervical spine 11/03/2020. FINDINGS: CT HEAD FINDINGS Brain: No evidence of acute infarction, hemorrhage, hydrocephalus, extra-axial collection or mass lesion/mass effect. Vascular: No hyperdense vessel. Skull: No acute fracture. Sinuses/Orbits: Clear sinuses.  No acute orbital findings. Other: No mastoid effusions. CT CERVICAL SPINE FINDINGS Alignment: Chronic ununited odontoid fracture with similar mild distraction and new (now 2 mm) of anterior subluxation of the superior fragment relative to the base of the dens. Otherwise, no significant change  in alignment including mild anterolisthesis C5 on C6. Skull base and vertebrae: Chronic ununited odontoid fracture with alignment described above. No evidence of acute fracture. Soft tissues and spinal canal: No prevertebral fluid or swelling. No visible canal hematoma. Disc levels:  Similar multilevel degenerative change. Upper chest: Mild ground-glass opacities, likely related to expiratory imaging. IMPRESSION: 1. No evidence of acute intracranial abnormality. 2. No evidence of acute fracture in the cervical spine. 3. Chronic ununited odontoid fracture with similar mild distraction and new (now 2 mm) of anterior subluxation of the superior fragment relative to the base of the dens. Electronically Signed   By: Feliberto Harts M.D.   On: 04/22/2023 11:24   CT CERVICAL SPINE WO CONTRAST  Result Date: 04/22/2023 CLINICAL DATA:  Head trauma, moderate-severe; Polytrauma, blunt EXAM: CT HEAD WITHOUT CONTRAST CT CERVICAL SPINE WITHOUT CONTRAST TECHNIQUE: Multidetector CT imaging of the head and cervical spine was performed following the standard protocol without intravenous contrast. Multiplanar CT image reconstructions of the cervical spine were also generated. RADIATION DOSE REDUCTION: This exam was performed according to the departmental dose-optimization program which includes automated exposure control, adjustment of the mA and/or kV according to patient size and/or use of iterative reconstruction technique. COMPARISON:  CT head and CT cervical spine 11/03/2020. FINDINGS: CT HEAD FINDINGS Brain: No evidence of acute infarction, hemorrhage, hydrocephalus, extra-axial collection or mass lesion/mass effect. Vascular: No hyperdense vessel. Skull: No acute fracture. Sinuses/Orbits: Clear sinuses.  No acute orbital findings. Other: No mastoid effusions. CT CERVICAL SPINE FINDINGS Alignment: Chronic ununited odontoid fracture with similar mild distraction and new (now 2 mm) of anterior subluxation of the superior  fragment relative to the base of the dens. Otherwise, no significant change in alignment including mild anterolisthesis C5 on C6. Skull  base and vertebrae: Chronic ununited odontoid fracture with alignment described above. No evidence of acute fracture. Soft tissues and spinal canal: No prevertebral fluid or swelling. No visible canal hematoma. Disc levels:  Similar multilevel degenerative change. Upper chest: Mild ground-glass opacities, likely related to expiratory imaging. IMPRESSION: 1. No evidence of acute intracranial abnormality. 2. No evidence of acute fracture in the cervical spine. 3. Chronic ununited odontoid fracture with similar mild distraction and new (now 2 mm) of anterior subluxation of the superior fragment relative to the base of the dens. Electronically Signed   By: Feliberto Harts M.D.   On: 04/22/2023 11:24      LOS: 1 day    Jacquelin Hawking, MD [facility] 04/23/2023, 7:37 AM   If 7PM-7AM, please contact night-coverage [email or web address]

## 2023-04-23 NOTE — TOC Initial Note (Signed)
Transition of Care [facility]) - Initial/Assessment Note    Patient Details  Name: [name] MRN: [id number] Date of Birth: 1931-09-25  Transition of Care [facility]) CM/SW Contact:    Ralene Bathe, LCSW Phone Number: 04/23/2023, 2:13 PM  Clinical Narrative:                 LCSW met with patient and patient's son at bedside.  Patient's son, Casimiro Needle, requested to speak with LCSW privately.  The son reports that the family is open to SNF.  The son reports that the family has a preference for [facility].  If [facility] has a long waiting list, [facility] would be the next facility considered.  The son does not want the patient's referral sent to any other facilities.   The patient was recently discharged from [facility] on Mar 28, 2023 and spent 28 days at the facility.  The son informed LCSW that the patient has secondary insurance that covers the copay associated with the SNF stay.    LCSW will fax out referral once PT has assessed and added recommendations.   TOC following.   Expected Discharge Plan: Skilled Nursing Facility Barriers to Discharge: Continued Medical Work up   Patient Goals and CMS Choice Patient states their goals for this hospitalization and ongoing recovery are:: Patients is unsure at this time CMS Medicare.gov Compare Post Acute Care list provided to:: Patient Represenative (must comment) (Son Casimiro Needle) Choice offered to / list presented to : Adult Children      Expected Discharge Plan and Services In-house Referral: Clinical Social Work     Living arrangements for the past 2 months: Single Family Home, Skilled Nursing Facility                                      Prior Living Arrangements/Services Living arrangements for the past 2 months: Single Family Home, Skilled Nursing Facility Lives with:: Adult Children Patient language and need for interpreter reviewed:: Yes Do you feel safe going back to the place where you live?: Yes      Need for Family  Participation in Patient Care: Yes (Comment) Care giver support system in place?: Yes (comment)   Criminal Activity/Legal Involvement Pertinent to Current Situation/Hospitalization: No - Comment as needed  Activities of Daily Living Home Assistive Devices/Equipment: Oxygen, Wheelchair, Eyeglasses, CBG Meter, Shower chair with back, Grab bars in shower, Grab bars around toilet, Hand-held shower hose ADL Screening (condition at time of admission) Patient's cognitive ability adequate to safely complete daily activities?: Yes Is the patient deaf or have difficulty hearing?: Yes Does the patient have difficulty seeing, even when wearing glasses/contacts?: Yes Does the patient have difficulty concentrating, remembering, or making decisions?: Yes Patient able to express need for assistance with ADLs?: Yes Does the patient have difficulty dressing or bathing?: Yes Independently performs ADLs?: No Communication: Independent Dressing (OT): Needs assistance Is this a change from baseline?: Pre-admission baseline Grooming: Needs assistance Is this a change from baseline?: Pre-admission baseline Feeding: Independent Bathing: Needs assistance Is this a change from baseline?: Pre-admission baseline Toileting: Needs assistance Is this a change from baseline?: Pre-admission baseline In/Out Bed: Needs assistance Is this a change from baseline?: Pre-admission baseline Walks in Home: Dependent Is this a change from baseline?: Pre-admission baseline Does the patient have difficulty walking or climbing stairs?: Yes Weakness of Legs: Both Weakness of Arms/Hands: Both  Permission Sought/Granted   Permission granted to  share information with : Yes, Verbal Permission Granted     Permission granted to share info w AGENCY: [facility] (SNF) and [facility] (SNF)        Emotional Assessment Appearance:: Appears stated age Attitude/Demeanor/Rapport: Engaged Affect (typically observed): Appropriate   Alcohol  / Substance Use: Not Applicable Psych Involvement: No (comment)  Admission diagnosis:  UTI (urinary tract infection) [N39.0] Urinary tract infection [N39.0] Patient Active Problem List   Diagnosis Date Noted   UTI (urinary tract infection) 04/22/2023   Acute encephalopathy 04/22/2023   Heart failure with preserved ejection fraction (HCC) 04/22/2023   Chronic respiratory failure with hypoxia (HCC) 04/22/2023   Elevated troponin 04/22/2023   Impaired mobility 04/22/2023   Encounter for general adult medical examination with abnormal findings 10/04/2022   Hyperkalemia 06/13/2022   Leukocytosis 06/13/2022   Hyponatremia 05/15/2022   Normocytic anemia 05/15/2022   DNR (do not resuscitate) 05/14/2022   Dysuria 02/15/2021   Suspected sleep apnea 11/08/2020   Hearing loss of left ear due to cerumen impaction 09/22/2020   Osteoarthritis 09/22/2020   Gout 03/31/2020   Chronic kidney disease, stage III (moderate) (HCC) 09/21/2019   Morbid obesity (HCC) 09/21/2019   Diabetic polyneuropathy associated with type 2 diabetes mellitus (HCC) 02/18/2017   Vitamin D deficiency 02/18/2017   COPD (chronic obstructive pulmonary disease) (HCC)    Pulmonary hypertension (HCC)    Hypertensive retinopathy of both eyes 09/26/2015   Pseudophakia of both eyes 09/26/2015   Diabetes with neurologic complications (HCC) 06/07/2014   Chronic diastolic CHF (congestive heart failure) (HCC) 03/18/2011   Hyperlipidemia associated with type 2 diabetes mellitus (HCC) 03/18/2011   Essential hypertension    PCP:  Myrlene Broker, MD Pharmacy:   [facility] [address] [place], [place] - [address] [place] [address] Phone: [phone] Fax: [phone]  [facility] [address] [place] [address] Phone: [phone] Fax: [phone]  [facility] - [place], [place] - [address] [place] [id number] Phone: [phone] Fax:  [phone]     Social Determinants of Health (SDOH) Social History: SDOH Screenings   Food Insecurity: No Food Insecurity (04/22/2023)  Housing: Low Risk  (04/22/2023)  Transportation Needs: No Transportation Needs (04/22/2023)  Utilities: Not At Risk (04/22/2023)  Alcohol Screen: Low Risk  (06/21/2021)  Depression (PHQ2-9): Low Risk  (10/04/2022)  Financial Resource Strain: Low Risk  (06/21/2021)  Physical Activity: Insufficiently Active (06/21/2021)  Social Connections: Socially Isolated (06/21/2021)  Stress: No Stress Concern Present (06/21/2021)  Tobacco Use: Low Risk  (04/22/2023)   SDOH Interventions:     Readmission Risk Interventions    11/07/2020    3:49 PM  Readmission Risk Prevention Plan  Transportation Screening Complete  PCP or Specialist Appt within 3-5 Days Complete  HRI or Home Care Consult Patient refused  Social Work Consult for Recovery Care Planning/Counseling Complete  Palliative Care Screening Not Applicable

## 2023-04-23 NOTE — Evaluation (Addendum)
Physical Therapy Evaluation Patient Details Name: [name] MRN: [id number] DOB: February 28, 1931 Today's Date: 04/23/2023  History of Present Illness  89 y.o. female presenting via EMS from home with son after being found down with O2 off. PMH significant for DM, CHF, gout, arthritis, anemia, HTN, neuropathy, COPD, HLD, on 6L supplemental O2, recently diagnosed with vascular dementia.   Clinical Impression  Upon evaluation, patient supine with HOB elevated and son present. Prior to admission, patient utilized manual wheelchair for household ambulation and was supported by her son for ADLs. Patient confused at start of session stating "I've already seen you" and "I need to help clean my son's mess" throughout the session. Patient is able to perform bed mobility and balance at EOB without physical assistance; appeared to be uncomfortable and restless with HOB lowered for bed mobility. Patient requested to return back to supine following sitting EOB. Recommend next session to include transfers to EOB to begin with [facility] elevated for comfort. Son reports that patient was able to demonstrate significant improvements with prior rehab but was halted due to pneumonia. Patient will benefit from skilled physical therapy < 3 hours a day in order to improve functional mobility, independence and reduce caregiver burden.      Recommendations for follow up therapy are one component of a multi-disciplinary discharge planning process, led by the attending physician.  Recommendations may be updated based on patient status, additional functional criteria and insurance authorization.  Follow Up Recommendations Can patient physically be transported by private vehicle: No     Assistance Recommended at Discharge Frequent or constant Supervision/Assistance  Patient can return home with the following  A lot of help with walking and/or transfers;A lot of help with bathing/dressing/bathroom;Direct supervision/assist for  medications management;Assist for transportation;Help with stairs or ramp for entrance    Equipment Recommendations None recommended by PT  Recommendations for Other Services       Functional Status Assessment Patient has had a recent decline in their functional status and demonstrates the ability to make significant improvements in function in a reasonable and predictable amount of time.     Precautions / Restrictions Precautions Precautions: Fall Restrictions Weight Bearing Restrictions: No      Mobility  Bed Mobility Overal bed mobility: Needs Assistance Bed Mobility: Supine to Sit, Sit to Supine, Rolling Rolling: Min guard   Supine to sit: Min assist Sit to supine: Min guard   General bed mobility comments: Patient able to roll with HOB lowered and rail support; minimal assistance but proceeded to be restless and reported feeling uncomfortable. Raised HOB and patient was able to transition from supine <> EOB without physical assistance.    Transfers Overall transfer level: Needs assistance                 General transfer comment: Transfer limited due to restlessness. Patient wanted to return back to bed.    Ambulation/Gait               General Gait Details: Unable to progress due to LE weakness and imbalance  Stairs            Wheelchair Mobility    Modified Rankin (Stroke Patients Only)       Balance Overall balance assessment: Mild deficits observed, not formally tested Sitting-balance support: No upper extremity supported, Feet supported, Bilateral upper extremity supported Sitting balance-Leahy Scale: Fair  Pertinent Vitals/Pain Pain Assessment Pain Assessment: No/denies pain    Home Living Family/patient expects to be discharged to:: Skilled nursing facility Living Arrangements: Children   Type of Home: House Home Access: Ramped entrance       Home Layout: Two  level;Able to live on main level with bedroom/bathroom Home Equipment: Wheelchair - manual;Grab bars - tub/shower;Rolling Environmental consultant (2 wheels);Hand held shower head;Adaptive equipment;Shower seat (shower seat with back)      Prior Function Prior Level of Function : Needs assist             Mobility Comments: Son assists with transfers recently (1 year) to and from wheelchair ADLs Comments: has not used walker in a long time; son assists with all transfers 24 hours/day assist; trouble when son is sleeping due to pt will doff her O2 and then attempt transfers and forgets to re-don O2. Son assisting with dressing over the past several months as well as home making for several years.     Hand Dominance   Dominant Hand: Right    Extremity/Trunk Assessment   Upper Extremity Assessment Upper Extremity Assessment: Defer to OT evaluation    Lower Extremity Assessment Lower Extremity Assessment: Generalized weakness    Cervical / Trunk Assessment Cervical / Trunk Assessment: Kyphotic  Communication   Communication: No difficulties  Cognition Arousal/Alertness: Awake/alert Behavior During Therapy: Restless Overall Cognitive Status: History of cognitive impairments - at baseline                                 General Comments: Pleasant at beginning of session stated that she has already seen PT. Clarified that she saw OT; pt restless with HOB lowered for bed mobility. Patient reported PT needs to leave because she didn't feel comfortable rolling into sidelying. Continued to state "I need to clean my son's mess and I have to make the bed"        General Comments General comments (skin integrity, edema, etc.): Son present at beginning of session; left room to speak with social worker regarding discharge recommendation. Son reports patient made functional gains in another rehab facility prior to admission but regressed due to pneumonia.    Exercises     Assessment/Plan     PT Assessment Patient needs continued PT services  PT Problem List Decreased strength;Decreased range of motion;Decreased activity tolerance;Decreased balance;Decreased mobility;Decreased coordination;Decreased knowledge of use of DME;Decreased cognition;Decreased safety awareness;Decreased knowledge of precautions       PT Treatment Interventions DME instruction;Gait training;Functional mobility training;Stair training;Therapeutic activities;Therapeutic exercise;Balance training;Neuromuscular re-education    PT Goals (Current goals can be found in the Care Plan section)  Acute Rehab PT Goals Patient Stated Goal: Family reports wanting patient to return to PLOF PT Goal Formulation: With family Time For Goal Achievement: 04/30/23 Potential to Achieve Goals: Fair    Frequency Min 2X/week     Co-evaluation               AM-PAC PT "6 Clicks" Mobility  Outcome Measure Help needed turning from your back to your side while in a flat bed without using bedrails?: A Lot Help needed moving from lying on your back to sitting on the side of a flat bed without using bedrails?: A Lot Help needed moving to and from a bed to a chair (including a wheelchair)?: A Lot Help needed standing up from a chair using your arms (e.g., wheelchair or bedside chair)?: A Lot  Help needed to walk in hospital room?: Total Help needed climbing 3-5 steps with a railing? : Total 6 Click Score: 10    End of Session   Activity Tolerance: Patient tolerated treatment well Patient left: in bed;with call bell/phone within reach;with bed alarm set Nurse Communication: Mobility status PT Visit Diagnosis: Muscle weakness (generalized) (M62.81);Difficulty in walking, not elsewhere classified (R26.2);Unsteadiness on feet (R26.81)    Time: 2956-2130 PT Time Calculation (min) (ACUTE ONLY): 18 min   Charges:   PT Evaluation $PT Eval Moderate Complexity: 1 Mod          Christene Lye, SPT Acute Rehabilitation  Services [phone] Secure chat preferred    Christene Lye 04/23/2023, 3:27 PM

## 2023-04-23 NOTE — [place] FL2 (Signed)
[place] MEDICAID FL2 LEVEL OF CARE FORM     IDENTIFICATION  Patient Name: [name] Birthdate: [birth year] Sex: female Admission Date (Current Location): 04/22/2023  [facility] and [place] Number:  Producer, television/film/video and Address:  [facility] [employer] [facility], [address], [place], [place] [address]      Provider Number: [id number]  Attending Physician Name and Address:  Narda Bonds, MD  Relative Name and Phone Number:  EVANELL, WYNKOOP [facility]) [phone]    Current Level of Care: Hospital Recommended Level of Care: Skilled Nursing Facility Prior Approval Number:    Date Approved/Denied:   PASRR Number: [id number]  Discharge Plan: SNF    Current Diagnoses: Patient Active Problem List   Diagnosis Date Noted   UTI (urinary tract infection) 04/22/2023   Acute encephalopathy 04/22/2023   Heart failure with preserved ejection fraction (HCC) 04/22/2023   Chronic respiratory failure with hypoxia (HCC) 04/22/2023   Elevated troponin 04/22/2023   Impaired mobility 04/22/2023   Encounter for general adult medical examination with abnormal findings 10/04/2022   Hyperkalemia 06/13/2022   Leukocytosis 06/13/2022   Hyponatremia 05/15/2022   Normocytic anemia 05/15/2022   DNR (do not resuscitate) 05/14/2022   Dysuria 02/15/2021   Suspected sleep apnea 11/08/2020   Hearing loss of left ear due to cerumen impaction 09/22/2020   Osteoarthritis 09/22/2020   Gout 03/31/2020   Chronic kidney disease, stage III (moderate) (HCC) 09/21/2019   Morbid obesity (HCC) 09/21/2019   Diabetic polyneuropathy associated with type 2 diabetes mellitus (HCC) 02/18/2017   Vitamin D deficiency 02/18/2017   COPD (chronic obstructive pulmonary disease) (HCC)    Pulmonary hypertension (HCC)    Hypertensive retinopathy of both eyes 09/26/2015   Pseudophakia of both eyes 09/26/2015   Diabetes with neurologic complications (HCC) 06/07/2014   Chronic diastolic CHF (congestive heart  failure) (HCC) 03/18/2011   Hyperlipidemia associated with type 2 diabetes mellitus (HCC) 03/18/2011   Essential hypertension     Orientation RESPIRATION BLADDER Height & Weight     Self, Place  O2 (5L nasal cannula) Incontinent Weight: 207 lb 3.7 oz (94 kg) Height:  5\' 4"  (162.6 cm)  BEHAVIORAL SYMPTOMS/MOOD NEUROLOGICAL BOWEL NUTRITION STATUS      Continent Diet (see d/c summary)  AMBULATORY STATUS COMMUNICATION OF NEEDS Skin   Total Care Verbally Normal                       Personal Care Assistance Level of Assistance  Bathing, Feeding, Dressing Bathing Assistance: Maximum assistance Feeding assistance: Limited assistance Dressing Assistance: Maximum assistance     Functional Limitations Info  Sight, Hearing, Speech Sight Info: Impaired Hearing Info: Impaired Speech Info: Adequate    SPECIAL CARE FACTORS FREQUENCY  PT (By licensed PT), OT (By licensed OT)     PT Frequency: 5x/ week OT Frequency: 5x/week            Contractures Contractures Info: Not present    Additional Factors Info  Code Status, Allergies Code Status Info: DNR Allergies Info: Atenolol  Chlorhexidine  Codeine  Lactose Intolerance (Gi)  Lodine (Etodolac)  Sulfa Drugs Cross Reactors  Sulfamethoxazole  Benzonatate  Escitalopram Oxalate  Metformin  Oxycodone-acetaminophen  Pregabalin           Current Medications (04/23/2023):  This is the current hospital active medication list Current Facility-Administered Medications  Medication Dose Route Frequency Provider Last Rate Last Admin   acetaminophen (TYLENOL) tablet 650 mg  650 mg Oral Q6H  PRN Clydie Braun, MD       Or   acetaminophen (TYLENOL) suppository 650 mg  650 mg Rectal Q6H PRN Madelyn Flavors A, MD       acidophilus (RISAQUAD) capsule 1 capsule  1 capsule Oral Daily Smith, Rondell A, MD   1 capsule at 04/23/23 1046   albuterol (PROVENTIL) (2.5 MG/3ML) 0.083% nebulizer solution 2.5 mg  2.5 mg Nebulization Q2H PRN Clydie Braun, MD       ALPRAZolam Prudy Feeler) tablet 0.25 mg  0.25 mg Oral QHS PRN Narda Bonds, MD       atorvastatin (LIPITOR) tablet 40 mg  40 mg Oral QHS Smith, Rondell A, MD       cefTRIAXone (ROCEPHIN) 1 g in sodium chloride 0.9 % 100 mL IVPB  1 g Intravenous Q24H Smith, Rondell A, MD 200 mL/hr at 04/23/23 1424 1 g at 04/23/23 1424   enoxaparin (LOVENOX) injection 30 mg  30 mg Subcutaneous Q24H Leander Rams, RPH       fluticasone (FLONASE) 50 MCG/ACT nasal spray 2 spray  2 spray Each Nare QHS Smith, Rondell A, MD   2 spray at 04/22/23 2148   fluticasone furoate-vilanterol (BREO ELLIPTA) 200-25 MCG/ACT 1 puff  1 puff Inhalation Daily Madelyn Flavors A, MD   1 puff at 04/23/23 0745   gabapentin (NEURONTIN) capsule 100 mg  100 mg Oral BID Katrinka Blazing, Rondell A, MD   100 mg at 04/23/23 1047   guaiFENesin (MUCINEX) 12 hr tablet 600 mg  600 mg Oral BID Smith, Rondell A, MD   600 mg at 04/23/23 1046   insulin aspart (novoLOG) injection 0-15 Units  0-15 Units Subcutaneous TID WC Smith, Rondell A, MD   3 Units at 04/23/23 1152   insulin aspart (novoLOG) injection 0-5 Units  0-5 Units Subcutaneous QHS Smith, Rondell A, MD       insulin glargine-yfgn (SEMGLEE) injection 17 Units  17 Units Subcutaneous QHS Madelyn Flavors A, MD   17 Units at 04/22/23 2202   lactase (LACTAID) tablet 6,000 Units  6,000 Units Oral q morning Madelyn Flavors A, MD   6,000 Units at 04/23/23 0744   metoprolol tartrate (LOPRESSOR) tablet 25 mg  25 mg Oral BID Madelyn Flavors A, MD   25 mg at 04/23/23 1046   sodium chloride flush (NS) 0.9 % injection 3 mL  3 mL Intravenous Q12H Smith, Rondell A, MD   3 mL at 04/23/23 1045     Discharge Medications: Please see discharge summary for a list of discharge medications.  Relevant Imaging Results:  Relevant Lab Results:   Additional Information SSN:[id number]  Myrakle Wingler F Virdia Ziesmer, LCSW

## 2023-04-24 DIAGNOSIS — N3000 Acute cystitis without hematuria: Secondary | ICD-10-CM | POA: Diagnosis not present

## 2023-04-24 DIAGNOSIS — N1832 Chronic kidney disease, stage 3b: Secondary | ICD-10-CM | POA: Diagnosis not present

## 2023-04-24 DIAGNOSIS — I5032 Chronic diastolic (congestive) heart failure: Secondary | ICD-10-CM | POA: Diagnosis not present

## 2023-04-24 DIAGNOSIS — J9611 Chronic respiratory failure with hypoxia: Secondary | ICD-10-CM | POA: Diagnosis not present

## 2023-04-24 LAB — GLUCOSE, CAPILLARY
Glucose-Capillary: 121 mg/dL — ABNORMAL HIGH (ref 70–99)
Glucose-Capillary: 216 mg/dL — ABNORMAL HIGH (ref 70–99)
Glucose-Capillary: 164 mg/dL — ABNORMAL HIGH (ref 70–99)
Glucose-Capillary: 167 mg/dL — ABNORMAL HIGH (ref 70–99)

## 2023-04-24 LAB — URINE CULTURE: Culture: NO GROWTH

## 2023-04-24 NOTE — Progress Notes (Signed)
Mobility Specialist Progress Note   04/24/23 1650  Mobility  Activity Transferred from bed to chair  Level of Assistance Moderate assist, patient does 50-74%  Assistive Device Stedy  Activity Response Tolerated well  Mobility Referral Yes  $Mobility charge 1 Mobility  Mobility Specialist Start Time (ACUTE ONLY) 1650  Mobility Specialist Stop Time (ACUTE ONLY) 1700  Mobility Specialist Time Calculation (min) (ACUTE ONLY) 10 min   Pt received in bed having no complaints and agreeable. ModA to get pt EOB w/ HOB raised up but minA to stand pt in stedy at an elvated height. Pt expressing "trembling" and fatigue in legs during transfer but no faults throughout. Left in chair w/ call bell in reach and chair alarm on. RN aware.   Frederico Hamman Mobility Specialist Please contact via SecureChat or  Rehab office at [phone]

## 2023-04-24 NOTE — Progress Notes (Signed)
PROGRESS NOTE    Laurie FELIPE  [name]:[id number] DOB: 1930-11-09 DOA: 04/22/2023 PCP: Myrlene Broker, MD   Brief Narrative: [name] is a 89 y.o. female with a history of hypertension, hyperlipidemia, diastolic heart failure, COPD, chronic respiratory failure, obesity.  Patient presented secondary to progressive confusion with associated urinary frequency and incontinence.  Concern for possible urinary tract infection.  Patient started on empiric ceftriaxone and urine culture obtained.   Assessment/Plan:  UTI Concern on admission.  Urinalysis significant for rare bacteria with moderate leukocytes.  Urine culture obtained on admission was significant for multiple species.  Patient started empirically on ceftriaxone.  Repeat urine cultures no growth. -Continue ceftriaxone  Acute metabolic encephalopathy Per son, patient is at baseline.  Son attributes patient's confusion to chronic hypoxia.  Concern for possible UTI etiology for confusion as well.  Chronic heart failure with preserved EF Stable.  COPD Stable.  No exacerbation. -Continue Breo Ellipta and albuterol  Chronic respiratory failure with hypoxia Patient currently stable on home 5 to 6 L of supplemental oxygen.  History of respiratory failure with hypercapnia Patient was previously on NIV as an outpatient for which her son states that they have discontinued secondary to patient tolerance.  VBG obtained this admission with normal pCO2.  Elevated troponin Mildly elevated. Negative delta. No chest pain.  Primary hypertension -Continue metoprolol titrate  Odontoid fracture EDP discussed with neurosurgery prior to admission with recommendation for no management as it is stable and nonoperative. Recommendations for C-collar when up.  Diabetes mellitus type 2 Well-controlled for age.  Hemoglobin A1c of 7.0%. -Continue Semglee 17 units and sliding scale insulin  Anxiety -Continue home Xanax as  needed  Anemia Mild.  Stable.  CKD stage IIIb Patient does not meet criteria for AKI. Stable.  Impaired mobility Pressure, patient is wheelchair-bound but transfers.  PT and OT consulted for evaluation.  Son is wanting patient to discharge to SNF.  DVT prophylaxis: Lovenox Code Status:   Code Status: DNR Family Communication: None at bedside.  Called son via telephone but no response and went to voicemail Disposition Plan: Discharge pending urine culture results and outpatient antibiotic regimen   Consultants:  None  Procedures:  None  Antimicrobials: Ceftriaxone IV    Subjective: Patient reports no issues today.  Feels well.  Objective: BP 134/65 (BP Location: Left Arm)   Pulse 86   Temp 98 F (36.7 C) (Oral)   Resp 18   Ht 5\' 4"  (1.626 m)   Wt 94 kg   SpO2 94%   BMI 35.57 kg/m   Examination:  General exam: Appears calm and comfortable Respiratory system: Clear to auscultation. Respiratory effort normal. Cardiovascular system: S1 & S2 heard, RRR. Gastrointestinal system: Abdomen is nondistended, soft and nontender. Normal bowel sounds heard. Central nervous system: Alert and oriented to person, place, time. Musculoskeletal: No edema. No calf tenderness Skin: No cyanosis. No rashes Psychiatry: Judgement and insight appear normal. Mood & affect appropriate.    Data Reviewed: I have personally reviewed following labs and imaging studies   Last CBC Lab Results  Component Value Date   WBC 7.6 04/23/2023   HGB 11.0 (L) 04/23/2023   HCT 37.6 04/23/2023   MCV 94.5 04/23/2023   MCH 27.6 04/23/2023   RDW 17.3 (H) 04/23/2023   PLT 241 04/23/2023     Last metabolic panel Lab Results  Component Value Date   GLUCOSE 119 (H) 04/23/2023   NA 138 04/23/2023   K 4.2 04/23/2023  CL 97 (L) 04/23/2023   CO2 27 04/23/2023   BUN 33 (H) 04/23/2023   CREATININE 1.48 (H) 04/23/2023   GFRNONAA 34 (L) 04/23/2023   CALCIUM 8.6 (L) 04/23/2023   PHOS 3.8 05/19/2022    PROT 5.9 (L) 04/22/2023   ALBUMIN 3.4 (L) 04/22/2023   LABGLOB 2.0 10/11/2022   AGRATIO 2.1 10/11/2022   BILITOT 0.7 04/22/2023   ALKPHOS 84 04/22/2023   AST 23 04/22/2023   ALT 15 04/22/2023   ANIONGAP 14 04/23/2023     Creatinine Clearance: Estimated Creatinine Clearance: 28.6 mL/min (A) (by C-G formula based on SCr of 1.48 mg/dL (H)).  Recent Results (from the past 240 hour(s))  Urine Culture     Status: Abnormal   Collection Time: 04/22/23  9:47 AM   Specimen: Urine, Clean Catch  Result Value Ref Range Status   Specimen Description URINE, CLEAN CATCH  Final   Special Requests   Final    NONE Performed at [facility], [address] [place], [place] [address]    Culture MULTIPLE SPECIES PRESENT, SUGGEST RECOLLECTION (A)  Final   Report Status 04/23/2023 FINAL  Final  Urine Culture (for pregnant, neutropenic or urologic patients or patients with an indwelling urinary catheter)     Status: None   Collection Time: 04/23/23 11:21 AM   Specimen: In/Out Cath Urine  Result Value Ref Range Status   Specimen Description IN/OUT CATH URINE  Final   Special Requests NONE  Final   Culture   Final    NO GROWTH Performed at [facility], [address] [place], [place] [address]    Report Status 04/24/2023 FINAL  Final      Radiology Studies: CT CHEST ABDOMEN PELVIS WO CONTRAST  Result Date: 04/22/2023 CLINICAL DATA:  Polytrauma EXAM: CT CHEST, ABDOMEN AND PELVIS WITHOUT CONTRAST TECHNIQUE: Multidetector CT imaging of the chest, abdomen and pelvis was performed following the standard protocol without IV contrast. RADIATION DOSE REDUCTION: This exam was performed according to the departmental dose-optimization program which includes automated exposure control, adjustment of the mA and/or kV according to patient size and/or use of iterative reconstruction technique. COMPARISON:  CT angiogram chest 10/05/2016 FINDINGS: CT CHEST FINDINGS Cardiovascular: Heart is mildly  enlarged. Aorta is normal in size. There is no pericardial effusion. There are atherosclerotic calcifications of the aorta and coronary arteries. Mediastinum/Nodes: There are numerous prominent, but nonenlarged mediastinal lymph nodes diffusely. Visualized esophagus and thyroid gland are within normal limits. There is a small hiatal hernia. Lungs/Pleura: 2 mm right upper lobe peripheral nodule image 5/50 appears unchanged from 2017 compatible with benign etiology. There some atelectatic changes in the bilateral lower lobes. There is no focal lung consolidation, pleural effusion or pneumothorax. There some scattered ground-glass opacities throughout both lungs. Musculoskeletal: There are healed left fifth, sixth, seventh rib fractures. No acute fractures are seen. CT ABDOMEN PELVIS FINDINGS Hepatobiliary: No hepatic injury or perihepatic hematoma. Gallbladder sludge is likely present. Pancreas: 1.7 x 1.5 cm cystic lesion in the tail the pancreas. Otherwise, the pancreas is within normal limits. Spleen: No splenic injury or perisplenic hematoma. Adrenals/Urinary Tract: Adrenal glands are unremarkable. Kidneys are normal, without renal calculi, focal lesion, or hydronephrosis. Bladder is unremarkable. Stomach/Bowel: Stomach is within normal limits. Appendix appears normal. No evidence of bowel wall thickening, distention, or inflammatory changes. There is sigmoid colon diverticulosis. Vascular/Lymphatic: Aortic atherosclerosis. No enlarged abdominal or pelvic lymph nodes. Reproductive: Status post hysterectomy. No adnexal masses. Other: There is no free fluid  or free air. There is a small fat containing umbilical hernia. Musculoskeletal: L3-L5 posterior fusion hardware is present. Left hip arthroplasty is present. IMPRESSION: 1. No acute posttraumatic sequelae in the chest, abdomen or pelvis. 2. Mild cardiomegaly. 3. Scattered ground-glass opacities throughout both lungs, likely infectious/inflammatory. 4. 1.7 cm  cystic lesion in the tail the pancreas. Recommend follow-up MRI/MRCP or pancreatic protocol CT in 6 months. 5. Gallbladder sludge. 6. Sigmoid colon diverticulosis. Aortic Atherosclerosis (ICD10-I70.0). Electronically Signed   By: Darliss Cheney M.D.   On: 04/22/2023 16:03   DG Pelvis Portable  Result Date: 04/22/2023 CLINICAL DATA:  Pain after trauma EXAM: PORTABLE PELVIS 1 VIEWS COMPARISON:  X-ray 03/06/2012 FINDINGS: Left hip arthroplasty identified. Press-Fit acetabular cup and cemented femoral component. No hardware failure. No fracture or dislocation. Moderate concentric joint space loss of the right hip. Sclerosis and small osteophytes along the sacroiliac joints. Fixation hardware seen along the lumbar spine at the edge of the imaging field. Overlapping cardiac leads. Mild sclerosis along the pubic symphysis. Vascular calcifications are seen. The extreme superior margin of the left hemi sacrum is clipped off the edge of the film. IMPRESSION: Degenerative changes.  Left hip arthroplasty. Electronically Signed   By: Karen Kays M.D.   On: 04/22/2023 12:25   DG Chest Port 1 View  Result Date: 04/22/2023 CLINICAL DATA:  Trauma.  Pain EXAM: PORTABLE CHEST 1 VIEW COMPARISON:  X-ray 06/13/2022 FINDINGS: Underinflation. Enlarged heart with some central vascular congestion. No pneumothorax, effusion or consolidation. Overlapping cardiac leads. Degenerative changes of the spine. Left-sided lateral rib fractures are seen. These could be chronic. IMPRESSION: Underinflation with enlarged heart with vascular congestion. Left-sided rib fractures.  These could be more chronic Electronically Signed   By: Karen Kays M.D.   On: 04/22/2023 12:24      LOS: 2 days    Jacquelin Hawking, MD [facility] 04/24/2023, 10:30 AM   If 7PM-7AM, please contact night-coverage [email or web address]

## 2023-04-24 NOTE — TOC Progression Note (Addendum)
Transition of Care [facility]) - Initial/Assessment Note    Patient Details  Name: [name] MRN: [id number] Date of Birth: Apr 07, 1931  Transition [facility] Care [facility]) CM/SW Contact:    Ralene Bathe, LCSW Phone Number: 04/24/2023, 11:12 AM  Clinical Narrative:                 LCSW contacted the family's facilities of choice.  [facility] is unable to accept and [facility] does not have any beds available.   LCSW contacted the patient's son and informed of the above.  The son is not agreeable to LCSW sending referral to other agencies at this time as he is deciding on next steps.  11:19-  Patient's son is agreeable for SNF referral to be faxed to other facilities.  Referral faxed out  13:55-  LCSW presented bed offers to son and is awaiting facility choice.    TOC following.    Expected Discharge Plan: Skilled Nursing Facility Barriers to Discharge: Continued Medical Work up   Patient Goals and CMS Choice Patient states their goals for this hospitalization and ongoing recovery are:: Patients is unsure at this time CMS Medicare.gov Compare Post Acute Care list provided to:: Patient Represenative (must comment) (Son Casimiro Needle) Choice offered to / list presented to : Adult Children      Expected Discharge Plan and Services In-house Referral: Clinical Social Work     Living arrangements for the past 2 months: Single Family Home, Skilled Nursing Facility                                      Prior Living Arrangements/Services Living arrangements for the past 2 months: Single Family Home, Skilled Nursing Facility Lives with:: Adult Children Patient language and need for interpreter reviewed:: Yes Do you feel safe going back to the place where you live?: Yes      Need for Family Participation in Patient Care: Yes (Comment) Care giver support system in place?: Yes (comment)   Criminal Activity/Legal Involvement Pertinent to Current Situation/Hospitalization: No - Comment  as needed  Activities of Daily Living Home Assistive Devices/Equipment: Oxygen, Wheelchair, Eyeglasses, CBG Meter, Shower chair with back, Grab bars in shower, Grab bars around toilet, Hand-held shower hose ADL Screening (condition at time of admission) Patient's cognitive ability adequate to safely complete daily activities?: Yes Is the patient deaf or have difficulty hearing?: Yes Does the patient have difficulty seeing, even when wearing glasses/contacts?: Yes Does the patient have difficulty concentrating, remembering, or making decisions?: Yes Patient able to express need for assistance with ADLs?: Yes Does the patient have difficulty dressing or bathing?: Yes Independently performs ADLs?: No Communication: Independent Dressing (OT): Needs assistance Is this a change from baseline?: Pre-admission baseline Grooming: Needs assistance Is this a change from baseline?: Pre-admission baseline Feeding: Independent Bathing: Needs assistance Is this a change from baseline?: Pre-admission baseline Toileting: Needs assistance Is this a change from baseline?: Pre-admission baseline In/Out Bed: Needs assistance Is this a change from baseline?: Pre-admission baseline Walks in Home: Dependent Is this a change from baseline?: Pre-admission baseline Does the patient have difficulty walking or climbing stairs?: Yes Weakness of Legs: Both Weakness of Arms/Hands: Both  Permission Sought/Granted   Permission granted to share information with : Yes, Verbal Permission Granted     Permission granted to share info w AGENCY: [facility] (SNF) and [facility] (SNF)        Emotional  Assessment Appearance:: Appears stated age Attitude/Demeanor/Rapport: Engaged Affect (typically observed): Appropriate   Alcohol / Substance Use: Not Applicable Psych Involvement: No (comment)  Admission diagnosis:  UTI (urinary tract infection) [N39.0] Urinary tract infection [N39.0] Patient Active Problem List    Diagnosis Date Noted   UTI (urinary tract infection) 04/22/2023   Acute encephalopathy 04/22/2023   Heart failure with preserved ejection fraction (HCC) 04/22/2023   Chronic respiratory failure with hypoxia (HCC) 04/22/2023   Elevated troponin 04/22/2023   Impaired mobility 04/22/2023   Encounter for general adult medical examination with abnormal findings 10/04/2022   Hyperkalemia 06/13/2022   Leukocytosis 06/13/2022   Hyponatremia 05/15/2022   Normocytic anemia 05/15/2022   DNR (do not resuscitate) 05/14/2022   Dysuria 02/15/2021   Suspected sleep apnea 11/08/2020   Hearing loss of left ear due to cerumen impaction 09/22/2020   Osteoarthritis 09/22/2020   Gout 03/31/2020   Chronic kidney disease, stage III (moderate) (HCC) 09/21/2019   Morbid obesity (HCC) 09/21/2019   Diabetic polyneuropathy associated with type 2 diabetes mellitus (HCC) 02/18/2017   Vitamin D deficiency 02/18/2017   COPD (chronic obstructive pulmonary disease) (HCC)    Pulmonary hypertension (HCC)    Hypertensive retinopathy of both eyes 09/26/2015   Pseudophakia of both eyes 09/26/2015   Diabetes with neurologic complications (HCC) 06/07/2014   Chronic diastolic CHF (congestive heart failure) (HCC) 03/18/2011   Hyperlipidemia associated with type 2 diabetes mellitus (HCC) 03/18/2011   Essential hypertension    PCP:  Myrlene Broker, MD Pharmacy:   [facility] [address] [place], [place] - [address] [place] [address] Phone: [phone] Fax: [phone]  [facility] [address] [place] [address] Phone: [phone] Fax: [phone]  [facility] [place], [place] - [address] [place] [phone] Phone: [phone] Fax: [phone]     Social Determinants of Health (SDOH) Social History: SDOH Screenings   Food Insecurity: No Food Insecurity (04/22/2023)  Housing: Low Risk  (04/22/2023)   Transportation Needs: No Transportation Needs (04/22/2023)  Utilities: Not At Risk (04/22/2023)  Alcohol Screen: Low Risk  (06/21/2021)  Depression (PHQ2-9): Low Risk  (10/04/2022)  Financial Resource Strain: Low Risk  (06/21/2021)  Physical Activity: Insufficiently Active (06/21/2021)  Social Connections: Socially Isolated (06/21/2021)  Stress: No Stress Concern Present (06/21/2021)  Tobacco Use: Low Risk  (04/22/2023)   SDOH Interventions:     Readmission Risk Interventions    11/07/2020    3:49 PM  Readmission Risk Prevention Plan  Transportation Screening Complete  PCP or Specialist Appt within 3-5 Days Complete  HRI or Home Care Consult Patient refused  Social Work Consult for Recovery Care Planning/Counseling Complete  Palliative Care Screening Not Applicable

## 2023-04-25 DIAGNOSIS — M6281 Muscle weakness (generalized): Secondary | ICD-10-CM | POA: Diagnosis not present

## 2023-04-25 DIAGNOSIS — J9691 Respiratory failure, unspecified with hypoxia: Secondary | ICD-10-CM | POA: Diagnosis not present

## 2023-04-25 DIAGNOSIS — R41841 Cognitive communication deficit: Secondary | ICD-10-CM | POA: Diagnosis not present

## 2023-04-25 DIAGNOSIS — Z9981 Dependence on supplemental oxygen: Secondary | ICD-10-CM | POA: Diagnosis not present

## 2023-04-25 DIAGNOSIS — Z741 Need for assistance with personal care: Secondary | ICD-10-CM | POA: Diagnosis not present

## 2023-04-25 DIAGNOSIS — M479 Spondylosis, unspecified: Secondary | ICD-10-CM | POA: Diagnosis not present

## 2023-04-25 DIAGNOSIS — S12110A Anterior displaced Type II dens fracture, initial encounter for closed fracture: Secondary | ICD-10-CM | POA: Insufficient documentation

## 2023-04-25 DIAGNOSIS — N3090 Cystitis, unspecified without hematuria: Secondary | ICD-10-CM | POA: Diagnosis not present

## 2023-04-25 DIAGNOSIS — Z87440 Personal history of urinary (tract) infections: Secondary | ICD-10-CM | POA: Diagnosis not present

## 2023-04-25 DIAGNOSIS — Z7401 Bed confinement status: Secondary | ICD-10-CM | POA: Diagnosis not present

## 2023-04-25 DIAGNOSIS — S12120A Other displaced dens fracture, initial encounter for closed fracture: Secondary | ICD-10-CM | POA: Diagnosis not present

## 2023-04-25 DIAGNOSIS — S12110D Anterior displaced Type II dens fracture, subsequent encounter for fracture with routine healing: Secondary | ICD-10-CM | POA: Diagnosis not present

## 2023-04-25 DIAGNOSIS — N390 Urinary tract infection, site not specified: Secondary | ICD-10-CM | POA: Diagnosis not present

## 2023-04-25 DIAGNOSIS — R2689 Other abnormalities of gait and mobility: Secondary | ICD-10-CM | POA: Diagnosis not present

## 2023-04-25 DIAGNOSIS — J309 Allergic rhinitis, unspecified: Secondary | ICD-10-CM | POA: Diagnosis not present

## 2023-04-25 DIAGNOSIS — D631 Anemia in chronic kidney disease: Secondary | ICD-10-CM | POA: Diagnosis not present

## 2023-04-25 DIAGNOSIS — R1311 Dysphagia, oral phase: Secondary | ICD-10-CM | POA: Diagnosis not present

## 2023-04-25 DIAGNOSIS — I10 Essential (primary) hypertension: Secondary | ICD-10-CM | POA: Diagnosis not present

## 2023-04-25 DIAGNOSIS — N3000 Acute cystitis without hematuria: Secondary | ICD-10-CM | POA: Diagnosis not present

## 2023-04-25 DIAGNOSIS — I509 Heart failure, unspecified: Secondary | ICD-10-CM | POA: Diagnosis not present

## 2023-04-25 DIAGNOSIS — N1832 Chronic kidney disease, stage 3b: Secondary | ICD-10-CM | POA: Diagnosis not present

## 2023-04-25 DIAGNOSIS — S12100D Unspecified displaced fracture of second cervical vertebra, subsequent encounter for fracture with routine healing: Secondary | ICD-10-CM | POA: Diagnosis not present

## 2023-04-25 DIAGNOSIS — R296 Repeated falls: Secondary | ICD-10-CM | POA: Diagnosis not present

## 2023-04-25 DIAGNOSIS — E785 Hyperlipidemia, unspecified: Secondary | ICD-10-CM | POA: Diagnosis not present

## 2023-04-25 DIAGNOSIS — E669 Obesity, unspecified: Secondary | ICD-10-CM | POA: Diagnosis not present

## 2023-04-25 DIAGNOSIS — M1990 Unspecified osteoarthritis, unspecified site: Secondary | ICD-10-CM | POA: Diagnosis not present

## 2023-04-25 DIAGNOSIS — J449 Chronic obstructive pulmonary disease, unspecified: Secondary | ICD-10-CM | POA: Diagnosis not present

## 2023-04-25 DIAGNOSIS — I130 Hypertensive heart and chronic kidney disease with heart failure and stage 1 through stage 4 chronic kidney disease, or unspecified chronic kidney disease: Secondary | ICD-10-CM | POA: Diagnosis not present

## 2023-04-25 DIAGNOSIS — R4182 Altered mental status, unspecified: Secondary | ICD-10-CM | POA: Diagnosis not present

## 2023-04-25 DIAGNOSIS — F0154 Vascular dementia, unspecified severity, with anxiety: Secondary | ICD-10-CM | POA: Diagnosis not present

## 2023-04-25 DIAGNOSIS — E1122 Type 2 diabetes mellitus with diabetic chronic kidney disease: Secondary | ICD-10-CM | POA: Diagnosis not present

## 2023-04-25 DIAGNOSIS — E1149 Type 2 diabetes mellitus with other diabetic neurological complication: Secondary | ICD-10-CM | POA: Diagnosis not present

## 2023-04-25 DIAGNOSIS — E1142 Type 2 diabetes mellitus with diabetic polyneuropathy: Secondary | ICD-10-CM | POA: Diagnosis not present

## 2023-04-25 DIAGNOSIS — M6259 Muscle wasting and atrophy, not elsewhere classified, multiple sites: Secondary | ICD-10-CM | POA: Diagnosis not present

## 2023-04-25 DIAGNOSIS — J9611 Chronic respiratory failure with hypoxia: Secondary | ICD-10-CM | POA: Diagnosis not present

## 2023-04-25 DIAGNOSIS — F03A18 Unspecified dementia, mild, with other behavioral disturbance: Secondary | ICD-10-CM | POA: Diagnosis not present

## 2023-04-25 DIAGNOSIS — Z794 Long term (current) use of insulin: Secondary | ICD-10-CM | POA: Diagnosis not present

## 2023-04-25 LAB — GLUCOSE, CAPILLARY
Glucose-Capillary: 124 mg/dL — ABNORMAL HIGH (ref 70–99)
Glucose-Capillary: 160 mg/dL — ABNORMAL HIGH (ref 70–99)
Glucose-Capillary: 144 mg/dL — ABNORMAL HIGH (ref 70–99)

## 2023-04-25 MED ORDER — ALPRAZOLAM 0.25 MG PO TABS: 0.2500 mg | ORAL_TABLET | Freq: Every evening | ORAL | 0 refills | Status: DC | PRN

## 2023-04-25 NOTE — TOC Transition Note (Signed)
Transition of Care [facility]) - CM/SW Discharge Note   Patient Details  Name: [name] MRN: [id number] Date of Birth: [birth year]  Transition of Care [facility]) CM/SW Contact:  Ralene Bathe, LCSW Phone Number: 04/25/2023, 12:30 PM   Clinical Narrative:    Patient will DC to: [facility] Anticipated DC date: 04/25/2023 Family notified: Yes Transport by: Sharin Mons   Per MD patient ready for DC to SNF. RN to call report prior to discharge [phone] room 3250). RN, patient, patient's family, and facility notified of DC. Discharge Summary sent to facility. Ambulance transport requested for patient.   CSW will sign off for now as social work intervention is no longer needed. Please consult [place] again if new needs arise.    Final next level of care: Skilled Nursing Facility Barriers to Discharge: Barriers Resolved   Patient Goals and CMS Choice CMS Medicare.gov Compare Post Acute Care list provided to:: Patient Represenative (must comment) (Son Casimiro Needle) Choice offered to / list presented to : Adult Children  Discharge Placement                Patient chooses bed at:  ([facility]) Patient to be transferred to facility by: [facility] Name of family member notified: CUMA, POLYAKOV) [phone] Patient and family notified of of transfer: 04/25/23  Discharge Plan and Services Additional resources added to the After Visit Summary for   In-house Referral: Clinical Social Work                                   Social Determinants of Health (SDOH) Interventions SDOH Screenings   Food Insecurity: No Food Insecurity (04/22/2023)  Housing: Low Risk  (04/22/2023)  Transportation Needs: No Transportation Needs (04/22/2023)  Utilities: Not At Risk (04/22/2023)  Alcohol Screen: Low Risk  (06/21/2021)  Depression (PHQ2-9): Low Risk  (10/04/2022)  Financial Resource Strain: Low Risk  (06/21/2021)  Physical Activity: Insufficiently Active (06/21/2021)  Social Connections: Socially Isolated  (06/21/2021)  Stress: No Stress Concern Present (06/21/2021)  Tobacco Use: Low Risk  (04/22/2023)     Readmission Risk Interventions    11/07/2020    3:49 PM  Readmission Risk Prevention Plan  Transportation Screening Complete  PCP or Specialist Appt within 3-5 Days Complete  HRI or Home Care Consult Patient refused  Social Work Consult for Recovery Care Planning/Counseling Complete  Palliative Care Screening Not Applicable

## 2023-04-25 NOTE — TOC Transition Note (Signed)
Transition of Care [facility]) - CM/SW Discharge Note   Patient Details  Name: [name] MRN: [id number] Date of Birth: 1931-09-18  Transition of Care [facility]) CM/SW Contact:  Tom-Johnson, Hershal Coria, RN Phone Number: 04/25/2023, 3:27 PM   Clinical Narrative:     CM informed by LCSW that patient's son declined SNF and will be taking patient home with Home Health. Son requested [facility] notified and acceptance noted, info on AVS.   PTAR to transport home at discharge.  No further TOC needs noted.      Final next level of care: Skilled Nursing Facility Barriers to Discharge: Barriers Resolved   Patient Goals and CMS Choice CMS Medicare.gov Compare Post Acute Care list provided to:: Patient Represenative (must comment) (Son Casimiro Needle) Choice offered to / list presented to : Adult Children  Discharge Placement                Patient chooses bed at:  ([facility]) Patient to be transferred to facility by: [facility] Name of family member notified: KYLIEGH, JESTER) [phone] Patient and family notified of of transfer: 04/25/23  Discharge Plan and Services Additional resources added to the After Visit Summary for   In-house Referral: Clinical Social Work                                   Social Determinants of Health (SDOH) Interventions SDOH Screenings   Food Insecurity: No Food Insecurity (04/22/2023)  Housing: Low Risk  (04/22/2023)  Transportation Needs: No Transportation Needs (04/22/2023)  Utilities: Not At Risk (04/22/2023)  Alcohol Screen: Low Risk  (06/21/2021)  Depression (PHQ2-9): Low Risk  (10/04/2022)  Financial Resource Strain: Low Risk  (06/21/2021)  Physical Activity: Insufficiently Active (06/21/2021)  Social Connections: Socially Isolated (06/21/2021)  Stress: No Stress Concern Present (06/21/2021)  Tobacco Use: Low Risk  (04/22/2023)     Readmission Risk Interventions    11/07/2020    3:49 PM  Readmission Risk Prevention Plan  Transportation  Screening Complete  PCP or Specialist Appt within 3-5 Days Complete  HRI or Home Care Consult Patient refused  Social Work Consult for Recovery Care Planning/Counseling Complete  Palliative Care Screening Not Applicable

## 2023-04-25 NOTE — Progress Notes (Signed)
TOC CSW was contacted by RN that pts son, changed the facility to [facility].  CSW contacted Velna Hatchet at [facility].  Velna Hatchet needed AVS facility to be changed to [facility] from [facility].  CSW emailed AVS to [email or web address].  CSW updated Medical Necessity Form to [facility].    RN has been given call report information as follows:  room #:  211 and call report #:  [phone].  Tyaira Heward Tarpley-Carter, MSW, LCSW-A Pronouns:  She/Her/Hers [facility] Clinical Social Worker Direct Number:  [phone] [name]

## 2023-04-25 NOTE — Progress Notes (Signed)
Report given to RN at [facility].

## 2023-04-25 NOTE — Plan of Care (Signed)
Problem: Education: Goal: Ability to describe self-care measures that may prevent or decrease complications (Diabetes Survival Skills Education) will improve 04/25/2023 1927 by Jeraldine Loots, RN Outcome: Adequate for Discharge 04/25/2023 1927 by Jeraldine Loots, RN Outcome: Adequate for Discharge 04/25/2023 1926 by Jeraldine Loots, RN Outcome: Adequate for Discharge Goal: Individualized Educational Video(s) 04/25/2023 1927 by Jeraldine Loots, RN Outcome: Adequate for Discharge 04/25/2023 1927 by Jeraldine Loots, RN Outcome: Adequate for Discharge 04/25/2023 1926 by Jeraldine Loots, RN Outcome: Adequate for Discharge   Problem: Coping: Goal: Ability to adjust to condition or change in health will improve 04/25/2023 1927 by Jeraldine Loots, RN Outcome: Adequate for Discharge 04/25/2023 1927 by Jeraldine Loots, RN Outcome: Adequate for Discharge 04/25/2023 1926 by Jeraldine Loots, RN Outcome: Adequate for Discharge   Problem: Fluid Volume: Goal: Ability to maintain a balanced intake and output will improve 04/25/2023 1927 by Jeraldine Loots, RN Outcome: Adequate for Discharge 04/25/2023 1927 by Jeraldine Loots, RN Outcome: Adequate for Discharge 04/25/2023 1926 by Jeraldine Loots, RN Outcome: Adequate for Discharge   Problem: Health Behavior/Discharge Planning: Goal: Ability to identify and utilize available resources and services will improve 04/25/2023 1927 by Jeraldine Loots, RN Outcome: Adequate for Discharge 04/25/2023 1927 by Jeraldine Loots, RN Outcome: Adequate for Discharge 04/25/2023 1926 by Jeraldine Loots, RN Outcome: Adequate for Discharge Goal: Ability to manage health-related needs will improve 04/25/2023 1927 by Jeraldine Loots, RN Outcome: Adequate for Discharge 04/25/2023 1927 by Jeraldine Loots, RN Outcome: Adequate for Discharge 04/25/2023 1926 by Jeraldine Loots, RN Outcome: Adequate for Discharge   Problem:  Metabolic: Goal: Ability to maintain appropriate glucose levels will improve 04/25/2023 1927 by Jeraldine Loots, RN Outcome: Adequate for Discharge 04/25/2023 1927 by Jeraldine Loots, RN Outcome: Adequate for Discharge 04/25/2023 1926 by Jeraldine Loots, RN Outcome: Adequate for Discharge   Problem: Nutritional: Goal: Maintenance of adequate nutrition will improve 04/25/2023 1927 by Jeraldine Loots, RN Outcome: Adequate for Discharge 04/25/2023 1927 by Jeraldine Loots, RN Outcome: Adequate for Discharge 04/25/2023 1926 by Jeraldine Loots, RN Outcome: Adequate for Discharge Goal: Progress toward achieving an optimal weight will improve 04/25/2023 1927 by Jeraldine Loots, RN Outcome: Adequate for Discharge 04/25/2023 1927 by Jeraldine Loots, RN Outcome: Adequate for Discharge 04/25/2023 1926 by Jeraldine Loots, RN Outcome: Adequate for Discharge   Problem: Skin Integrity: Goal: Risk for impaired skin integrity will decrease 04/25/2023 1927 by Jeraldine Loots, RN Outcome: Adequate for Discharge 04/25/2023 1927 by Jeraldine Loots, RN Outcome: Adequate for Discharge 04/25/2023 1926 by Jeraldine Loots, RN Outcome: Adequate for Discharge   Problem: Tissue Perfusion: Goal: Adequacy of tissue perfusion will improve 04/25/2023 1927 by Jeraldine Loots, RN Outcome: Adequate for Discharge 04/25/2023 1927 by Jeraldine Loots, RN Outcome: Adequate for Discharge 04/25/2023 1926 by Jeraldine Loots, RN Outcome: Adequate for Discharge   Problem: Education: Goal: Knowledge of General Education information will improve Description: Including pain rating scale, medication(s)/side effects and non-pharmacologic comfort measures 04/25/2023 1927 by Jeraldine Loots, RN Outcome: Adequate for Discharge 04/25/2023 1927 by Jeraldine Loots, RN Outcome: Adequate for Discharge 04/25/2023 1926 by Jeraldine Loots, RN Outcome: Adequate for Discharge   Problem:  Health Behavior/Discharge Planning: Goal: Ability to manage health-related needs will improve 04/25/2023 1927 by Jeraldine Loots, RN Outcome: Adequate for Discharge 04/25/2023 1927 by Jeraldine Loots, RN Outcome: Adequate for Discharge 04/25/2023 1926 by  Jeraldine Loots, RN Outcome: Adequate for Discharge   Problem: Clinical Measurements: Goal: Ability to maintain clinical measurements within normal limits will improve 04/25/2023 1927 by Jeraldine Loots, RN Outcome: Adequate for Discharge 04/25/2023 1927 by Jeraldine Loots, RN Outcome: Adequate for Discharge 04/25/2023 1926 by Jeraldine Loots, RN Outcome: Adequate for Discharge Goal: Will remain free from infection 04/25/2023 1927 by Jeraldine Loots, RN Outcome: Adequate for Discharge 04/25/2023 1927 by Jeraldine Loots, RN Outcome: Adequate for Discharge 04/25/2023 1926 by Jeraldine Loots, RN Outcome: Adequate for Discharge Goal: Diagnostic test results will improve 04/25/2023 1927 by Jeraldine Loots, RN Outcome: Adequate for Discharge 04/25/2023 1927 by Jeraldine Loots, RN Outcome: Adequate for Discharge 04/25/2023 1926 by Jeraldine Loots, RN Outcome: Adequate for Discharge Goal: Respiratory complications will improve 04/25/2023 1927 by Jeraldine Loots, RN Outcome: Adequate for Discharge 04/25/2023 1927 by Jeraldine Loots, RN Outcome: Adequate for Discharge 04/25/2023 1926 by Jeraldine Loots, RN Outcome: Adequate for Discharge Goal: Cardiovascular complication will be avoided 04/25/2023 1927 by Jeraldine Loots, RN Outcome: Adequate for Discharge 04/25/2023 1927 by Jeraldine Loots, RN Outcome: Adequate for Discharge 04/25/2023 1926 by Jeraldine Loots, RN Outcome: Adequate for Discharge   Problem: Activity: Goal: Risk for activity intolerance will decrease 04/25/2023 1927 by Jeraldine Loots, RN Outcome: Adequate for Discharge 04/25/2023 1927 by Jeraldine Loots, RN Outcome:  Adequate for Discharge 04/25/2023 1926 by Jeraldine Loots, RN Outcome: Adequate for Discharge   Problem: Nutrition: Goal: Adequate nutrition will be maintained 04/25/2023 1927 by Jeraldine Loots, RN Outcome: Adequate for Discharge 04/25/2023 1927 by Jeraldine Loots, RN Outcome: Adequate for Discharge 04/25/2023 1926 by Jeraldine Loots, RN Outcome: Adequate for Discharge   Problem: Coping: Goal: Level of anxiety will decrease 04/25/2023 1927 by Jeraldine Loots, RN Outcome: Adequate for Discharge 04/25/2023 1927 by Jeraldine Loots, RN Outcome: Adequate for Discharge 04/25/2023 1926 by Jeraldine Loots, RN Outcome: Adequate for Discharge   Problem: Elimination: Goal: Will not experience complications related to bowel motility 04/25/2023 1927 by Jeraldine Loots, RN Outcome: Adequate for Discharge 04/25/2023 1927 by Jeraldine Loots, RN Outcome: Adequate for Discharge 04/25/2023 1926 by Jeraldine Loots, RN Outcome: Adequate for Discharge Goal: Will not experience complications related to urinary retention 04/25/2023 1927 by Jeraldine Loots, RN Outcome: Adequate for Discharge 04/25/2023 1927 by Jeraldine Loots, RN Outcome: Adequate for Discharge 04/25/2023 1926 by Jeraldine Loots, RN Outcome: Adequate for Discharge   Problem: Pain Managment: Goal: General experience of comfort will improve 04/25/2023 1927 by Jeraldine Loots, RN Outcome: Adequate for Discharge 04/25/2023 1927 by Jeraldine Loots, RN Outcome: Adequate for Discharge 04/25/2023 1926 by Jeraldine Loots, RN Outcome: Adequate for Discharge   Problem: Skin Integrity: Goal: Risk for impaired skin integrity will decrease 04/25/2023 1927 by Jeraldine Loots, RN Outcome: Adequate for Discharge 04/25/2023 1927 by Jeraldine Loots, RN Outcome: Adequate for Discharge 04/25/2023 1926 by Jeraldine Loots, RN Outcome: Adequate for Discharge

## 2023-04-25 NOTE — Care Management Important Message (Signed)
Important Message  Patient Details  Name: [name] MRN: [id number] Date of Birth: February 01, 1931   Medicare Important Message Given:  Yes     Kirbie Stodghill Stefan Church 04/25/2023, 3:25 PM

## 2023-04-25 NOTE — Progress Notes (Signed)
DISCHARGE NOTE SNF [facility] to be discharged [facility]  per MD order. Patient verbalized understanding.  Skin clean, dry and intact without evidence of skin break down, no evidence of skin tears noted. IV catheter discontinued intact. Site without signs and symptoms of complications. Dressing and pressure applied. Pt denies pain at the site currently. No complaints noted.  Patient free of lines, drains, and wounds.   Discharge packet assembled. An After Visit Summary (AVS) was printed and given to the EMS personnel. Patient escorted via stretcher and discharged to [facility] via ambulance. Report called to accepting facility; all questions and concerns addressed.   Carmin Muskrat, RN

## 2023-04-25 NOTE — Discharge Summary (Signed)
Physician Discharge Summary   Patient: [name] MRN: [id number] DOB: [birth year]  Admit date:     04/22/2023  Discharge date: 04/25/23  Discharge Physician: Jacquelin Hawking, MD   PCP: Myrlene Broker, MD   Recommendations at discharge:  PCP follow-up for hospital follow-up Neurosurgery for odontoid fracture follow-up.  Discharge Diagnoses: Principal Problem:   UTI (urinary tract infection) Active Problems:   Acute encephalopathy   Heart failure with preserved ejection fraction (HCC)   COPD (chronic obstructive pulmonary disease) (HCC)   Chronic respiratory failure with hypoxia (HCC)   Elevated troponin   Essential hypertension   Diabetes with neurologic complications (HCC)   Normocytic anemia   Chronic kidney disease, stage III (moderate) (HCC)   Impaired mobility   Odontoid fracture (HCC)  Resolved Problems:   * No resolved hospital problems. [facility] Course: [name] is a 89 y.o. female with a history of hypertension, hyperlipidemia, diastolic heart failure, COPD, chronic respiratory failure, obesity.  Patient presented secondary to progressive confusion with associated urinary frequency and incontinence.  Concern for possible urinary tract infection.  Patient started on empiric ceftriaxone and urine culture obtained.  Urine culture without identification of bacteria.  Patient completed a 3-day course of ceftriaxone IV.  Patient also found to have evidence of an acute on chronic odontoid fracture.  Neurosurgery with recommendations for cervical collar and outpatient follow-up.  Patient discharged to SNF.  Assessment and Plan:  UTI Concern on admission.  Urinalysis significant for rare bacteria with moderate leukocytes.  Urine culture obtained on admission was significant for multiple species.  Patient started empirically on ceftriaxone.  Repeat urine cultures no growth.  Patient completed a 3-day course of ceftriaxone IV.   Acute metabolic encephalopathy Per son,  patient is at baseline.  Son attributes patient's confusion to chronic hypoxia.  Concern for possible UTI etiology for confusion as well.  Encephalopathy resolved prior to discharge.   Chronic heart failure with preserved EF Stable.  Continue home regimen on discharge.   COPD Stable.  No exacerbation.  Resume home regimen on discharge.   Chronic respiratory failure with hypoxia Patient currently stable on home 5 to 6 L of supplemental oxygen.  Patient weaned as low as 4 L/min while admitted.   History of respiratory failure with hypercapnia Patient was previously on NIV as an outpatient for which her son states that they have discontinued secondary to patient tolerance.  VBG obtained this admission with mildly decreased pCO2 of 27 mmHg.   Elevated troponin Mildly elevated. Negative delta. No chest pain.   Primary hypertension Continue metoprolol titrate   Odontoid fracture EDP discussed with neurosurgery prior to admission with recommendation for no management as it is stable and nonoperative. Recommendations for Cervical collar when up and moving. Neurosurgery follow-up as an outpatient.   Diabetes mellitus type 2 Well-controlled for age.  Hemoglobin A1c of 7.0%. Continue home regimen.   Anxiety Continue home Xanax as needed.   Anemia Mild.  Stable.   CKD stage IIIb Patient does not meet criteria for AKI. Stable.   Impaired mobility Pressure, patient is wheelchair-bound but transfers.  PT and OT consulted for evaluation and recommended SNF.   Consultants: None Procedures performed: None  Disposition: Skilled nursing facility Diet recommendation: Carb modified diet   DISCHARGE MEDICATION: Allergies as of 04/25/2023       Reactions   Atenolol Other (See Comments)   weakness   Chlorhexidine Other (See Comments)   Unknown reaction   Codeine Nausea  And Vomiting   Hydrocodone is ok   Lactose Intolerance (gi) Diarrhea   Lodine [etodolac] Other (See Comments)    dizziness   Sulfa Drugs Cross Reactors Nausea Only   Sulfamethoxazole Nausea And Vomiting   Benzonatate Nausea And Vomiting   Escitalopram Oxalate Other (See Comments)   mild hallucinations   Metformin Nausea And Vomiting   Reaction to IR and XL   Oxycodone-acetaminophen Other (See Comments)   Percocet does not relieve pts' pain   Pregabalin Other (See Comments)   Unknown reaction        Medication List     STOP taking these medications    eplerenone 25 MG tablet Commonly known as: INSPRA   hydrALAZINE 25 MG tablet Commonly known as: APRESOLINE   nitrofurantoin (macrocrystal-monohydrate) 100 MG capsule Commonly known as: Macrobid       TAKE these medications    acetaminophen 500 MG tablet Commonly known as: TYLENOL Take 500 mg by mouth 2 (two) times daily as needed for mild pain or headache.   albuterol 108 (90 Base) MCG/ACT inhaler Commonly known as: VENTOLIN HFA Inhale 2 puffs into the lungs every 6 (six) hours as needed for wheezing or shortness of breath.   allopurinol 100 MG tablet Commonly known as: ZYLOPRIM TAKE 1 TABLET BY MOUTH EVERY DAY   ALPRAZolam 0.25 MG tablet Commonly known as: XANAX Take 1 tablet (0.25 mg total) by mouth at bedtime as needed for anxiety. TAKE 1 TABLET BY MOUTH AT BEDTIME AS NEEDED FOR ANXIETY Strength: 0.25 mg What changed: See the new instructions.   aspirin EC 81 MG tablet Take 81 mg by mouth every morning.   atorvastatin 40 MG tablet Commonly known as: LIPITOR TAKE 1 TABLET ONCE A DAY FOR CHOLESTEROL What changed: See the new instructions.   Breo Ellipta 200-25 MCG/ACT Aepb Generic drug: fluticasone furoate-vilanterol TAKE 1 PUFF BY MOUTH EVERY DAY What changed: See the new instructions.   diclofenac Sodium 1 % Gel Commonly known as: VOLTAREN Apply 4 g topically 4 (four) times daily. To affected joint. What changed:  when to take this reasons to take this   fluticasone 50 MCG/ACT nasal spray Commonly known  as: FLONASE Place 2 sprays into both nostrils daily. What changed: when to take this   gabapentin 100 MG capsule Commonly known as: NEURONTIN TAKE 1 CAPSULE BY MOUTH TWICE A DAY   insulin aspart 100 UNIT/ML injection Commonly known as: novoLOG Inject 0-17 Units into the skin 3 (three) times daily with meals. Per sliding scale.   insulin glargine 100 UNIT/ML injection Commonly known as: Lantus Inject 0.25 mLs (25 Units total) into the skin at bedtime. What changed: how much to take   ipratropium-albuterol 0.5-2.5 (3) MG/3ML Soln Commonly known as: DUONEB Take 3 mLs by nebulization every 6 (six) hours as needed. What changed: when to take this   LACTASE PO Take 1 tablet by mouth every morning.   metoprolol tartrate 25 MG tablet Commonly known as: LOPRESSOR TAKE 1 TABLET BY MOUTH TWICE A DAY   multivitamin with minerals Tabs tablet Take 1 tablet by mouth every morning.   OXYGEN Inhale 5-6 L into the lungs continuous.   Polyethyl Glycol-Propyl Glycol 0.4-0.3 % Soln Place 2 drops into both eyes daily as needed (dry eyes). Systane   PROBIOTIC DAILY PO Take 1 tablet by mouth every morning.   torsemide 10 MG tablet Commonly known as: DEMADEX Take 3 tablets (30mg ) by mouth every other day What changed:  how much to  take how to take this when to take this additional instructions   Vitamin D (Ergocalciferol) 1.25 MG (50000 UNIT) Caps capsule Commonly known as: DRISDOL TAKE 1 CAPSULE (50,000 UNITS TOTAL) BY MOUTH EVERY FRIDAY. What changed: See the new instructions.        Follow-up Information     Dawley, Troy C, DO. Schedule an appointment as soon as possible for a visit in 2 weeks.   Why: For reevaluation of your cervical spine injury. Contact information: [address] [place] [address] [place] [address] [phone]         Myrlene Broker, MD. Schedule an appointment as soon as possible for a visit in 1 week(s).   Specialty: Internal  Medicine Why: For hospital follow-up Contact information: [address] [place] [address] [phone]                Discharge Exam: BP (!) 155/78 (BP Location: Left Arm)   Pulse 80   Temp 98.3 F (36.8 C)   Resp 19   Ht 5\' 4"  (1.626 m)   Wt 94 kg   SpO2 94%   BMI 35.57 kg/m   General exam: Appears calm and comfortable Respiratory system: Clear to auscultation. Respiratory effort normal. Cardiovascular system: S1 & S2 heard, RRR. Gastrointestinal system: Abdomen is nondistended, soft and nontender. Normal bowel sounds heard. Central nervous system: Alert and oriented. Musculoskeletal: No edema. No calf tenderness Skin: No cyanosis. No rashes Psychiatry: Judgement and insight appear normal. Mood & affect appropriate.   Condition at discharge: stable  The results of significant diagnostics from this hospitalization (including imaging, microbiology, ancillary and laboratory) are listed below for reference.   Imaging Studies: CT CHEST ABDOMEN PELVIS WO CONTRAST  Result Date: 04/22/2023 CLINICAL DATA:  Polytrauma EXAM: CT CHEST, ABDOMEN AND PELVIS WITHOUT CONTRAST TECHNIQUE: Multidetector CT imaging of the chest, abdomen and pelvis was performed following the standard protocol without IV contrast. RADIATION DOSE REDUCTION: This exam was performed according to the departmental dose-optimization program which includes automated exposure control, adjustment of the mA and/or kV according to patient size and/or use of iterative reconstruction technique. COMPARISON:  CT angiogram chest 10/05/2016 FINDINGS: CT CHEST FINDINGS Cardiovascular: Heart is mildly enlarged. Aorta is normal in size. There is no pericardial effusion. There are atherosclerotic calcifications of the aorta and coronary arteries. Mediastinum/Nodes: There are numerous prominent, but nonenlarged mediastinal lymph nodes diffusely. Visualized esophagus and thyroid gland are within normal limits. There is a  small hiatal hernia. Lungs/Pleura: 2 mm right upper lobe peripheral nodule image 5/50 appears unchanged from 2017 compatible with benign etiology. There some atelectatic changes in the bilateral lower lobes. There is no focal lung consolidation, pleural effusion or pneumothorax. There some scattered ground-glass opacities throughout both lungs. Musculoskeletal: There are healed left fifth, sixth, seventh rib fractures. No acute fractures are seen. CT ABDOMEN PELVIS FINDINGS Hepatobiliary: No hepatic injury or perihepatic hematoma. Gallbladder sludge is likely present. Pancreas: 1.7 x 1.5 cm cystic lesion in the tail the pancreas. Otherwise, the pancreas is within normal limits. Spleen: No splenic injury or perisplenic hematoma. Adrenals/Urinary Tract: Adrenal glands are unremarkable. Kidneys are normal, without renal calculi, focal lesion, or hydronephrosis. Bladder is unremarkable. Stomach/Bowel: Stomach is within normal limits. Appendix appears normal. No evidence of bowel wall thickening, distention, or inflammatory changes. There is sigmoid colon diverticulosis. Vascular/Lymphatic: Aortic atherosclerosis. No enlarged abdominal or pelvic lymph nodes. Reproductive: Status post hysterectomy. No adnexal masses. Other: There is no free fluid or free air. There  is a small fat containing umbilical hernia. Musculoskeletal: L3-L5 posterior fusion hardware is present. Left hip arthroplasty is present. IMPRESSION: 1. No acute posttraumatic sequelae in the chest, abdomen or pelvis. 2. Mild cardiomegaly. 3. Scattered ground-glass opacities throughout both lungs, likely infectious/inflammatory. 4. 1.7 cm cystic lesion in the tail the pancreas. Recommend follow-up MRI/MRCP or pancreatic protocol CT in 6 months. 5. Gallbladder sludge. 6. Sigmoid colon diverticulosis. Aortic Atherosclerosis (ICD10-I70.0). Electronically Signed   By: Darliss Cheney M.D.   On: 04/22/2023 16:03   DG Pelvis Portable  Result Date:  04/22/2023 CLINICAL DATA:  Pain after trauma EXAM: PORTABLE PELVIS 1 VIEWS COMPARISON:  X-ray 03/06/2012 FINDINGS: Left hip arthroplasty identified. Press-Fit acetabular cup and cemented femoral component. No hardware failure. No fracture or dislocation. Moderate concentric joint space loss of the right hip. Sclerosis and small osteophytes along the sacroiliac joints. Fixation hardware seen along the lumbar spine at the edge of the imaging field. Overlapping cardiac leads. Mild sclerosis along the pubic symphysis. Vascular calcifications are seen. The extreme superior margin of the left hemi sacrum is clipped off the edge of the film. IMPRESSION: Degenerative changes.  Left hip arthroplasty. Electronically Signed   By: Karen Kays M.D.   On: 04/22/2023 12:25   DG Chest Port 1 View  Result Date: 04/22/2023 CLINICAL DATA:  Trauma.  Pain EXAM: PORTABLE CHEST 1 VIEW COMPARISON:  X-ray 06/13/2022 FINDINGS: Underinflation. Enlarged heart with some central vascular congestion. No pneumothorax, effusion or consolidation. Overlapping cardiac leads. Degenerative changes of the spine. Left-sided lateral rib fractures are seen. These could be chronic. IMPRESSION: Underinflation with enlarged heart with vascular congestion. Left-sided rib fractures.  These could be more chronic Electronically Signed   By: Karen Kays M.D.   On: 04/22/2023 12:24   CT HEAD WO CONTRAST  Result Date: 04/22/2023 CLINICAL DATA:  Head trauma, moderate-severe; Polytrauma, blunt EXAM: CT HEAD WITHOUT CONTRAST CT CERVICAL SPINE WITHOUT CONTRAST TECHNIQUE: Multidetector CT imaging of the head and cervical spine was performed following the standard protocol without intravenous contrast. Multiplanar CT image reconstructions of the cervical spine were also generated. RADIATION DOSE REDUCTION: This exam was performed according to the departmental dose-optimization program which includes automated exposure control, adjustment of the mA and/or kV  according to patient size and/or use of iterative reconstruction technique. COMPARISON:  CT head and CT cervical spine 11/03/2020. FINDINGS: CT HEAD FINDINGS Brain: No evidence of acute infarction, hemorrhage, hydrocephalus, extra-axial collection or mass lesion/mass effect. Vascular: No hyperdense vessel. Skull: No acute fracture. Sinuses/Orbits: Clear sinuses.  No acute orbital findings. Other: No mastoid effusions. CT CERVICAL SPINE FINDINGS Alignment: Chronic ununited odontoid fracture with similar mild distraction and new (now 2 mm) of anterior subluxation of the superior fragment relative to the base of the dens. Otherwise, no significant change in alignment including mild anterolisthesis C5 on C6. Skull base and vertebrae: Chronic ununited odontoid fracture with alignment described above. No evidence of acute fracture. Soft tissues and spinal canal: No prevertebral fluid or swelling. No visible canal hematoma. Disc levels:  Similar multilevel degenerative change. Upper chest: Mild ground-glass opacities, likely related to expiratory imaging. IMPRESSION: 1. No evidence of acute intracranial abnormality. 2. No evidence of acute fracture in the cervical spine. 3. Chronic ununited odontoid fracture with similar mild distraction and new (now 2 mm) of anterior subluxation of the superior fragment relative to the base of the dens. Electronically Signed   By: Feliberto Harts M.D.   On: 04/22/2023 11:24   CT CERVICAL SPINE WO  CONTRAST  Result Date: 04/22/2023 CLINICAL DATA:  Head trauma, moderate-severe; Polytrauma, blunt EXAM: CT HEAD WITHOUT CONTRAST CT CERVICAL SPINE WITHOUT CONTRAST TECHNIQUE: Multidetector CT imaging of the head and cervical spine was performed following the standard protocol without intravenous contrast. Multiplanar CT image reconstructions of the cervical spine were also generated. RADIATION DOSE REDUCTION: This exam was performed according to the departmental dose-optimization program  which includes automated exposure control, adjustment of the mA and/or kV according to patient size and/or use of iterative reconstruction technique. COMPARISON:  CT head and CT cervical spine 11/03/2020. FINDINGS: CT HEAD FINDINGS Brain: No evidence of acute infarction, hemorrhage, hydrocephalus, extra-axial collection or mass lesion/mass effect. Vascular: No hyperdense vessel. Skull: No acute fracture. Sinuses/Orbits: Clear sinuses.  No acute orbital findings. Other: No mastoid effusions. CT CERVICAL SPINE FINDINGS Alignment: Chronic ununited odontoid fracture with similar mild distraction and new (now 2 mm) of anterior subluxation of the superior fragment relative to the base of the dens. Otherwise, no significant change in alignment including mild anterolisthesis C5 on C6. Skull base and vertebrae: Chronic ununited odontoid fracture with alignment described above. No evidence of acute fracture. Soft tissues and spinal canal: No prevertebral fluid or swelling. No visible canal hematoma. Disc levels:  Similar multilevel degenerative change. Upper chest: Mild ground-glass opacities, likely related to expiratory imaging. IMPRESSION: 1. No evidence of acute intracranial abnormality. 2. No evidence of acute fracture in the cervical spine. 3. Chronic ununited odontoid fracture with similar mild distraction and new (now 2 mm) of anterior subluxation of the superior fragment relative to the base of the dens. Electronically Signed   By: Feliberto Harts M.D.   On: 04/22/2023 11:24    Microbiology: Results for orders placed or performed during the hospital encounter of 04/22/23  Urine Culture     Status: Abnormal   Collection Time: 04/22/23  9:47 AM   Specimen: Urine, Clean Catch  Result Value Ref Range Status   Specimen Description URINE, CLEAN CATCH  Final   Special Requests   Final    NONE Performed at [facility], [address], [place], [place] [address]    Culture MULTIPLE SPECIES PRESENT,  SUGGEST RECOLLECTION (A)  Final   Report Status 04/23/2023 FINAL  Final  Urine Culture (for pregnant, neutropenic or urologic patients or patients with an indwelling urinary catheter)     Status: None   Collection Time: 04/23/23 11:21 AM   Specimen: In/Out Cath Urine  Result Value Ref Range Status   Specimen Description IN/OUT CATH URINE  Final   Special Requests NONE  Final   Culture   Final    NO GROWTH Performed at [facility], [address], [place], [place] [address]    Report Status 04/24/2023 FINAL  Final    Labs: CBC: Recent Labs  Lab 04/22/23 1047 04/22/23 1053 04/22/23 1054 04/23/23 0428  WBC 9.5  --   --  7.6  NEUTROABS 6.8  --   --   --   HGB 11.2* 12.6 12.6 11.0*  HCT 38.5 37.0 37.0 37.6  MCV 90.6  --   --  94.5  PLT 273  --   --  241   Basic Metabolic Panel: Recent Labs  Lab 04/22/23 1047 04/22/23 1053 04/22/23 1054 04/23/23 0428  NA 142 138 138 138  K 3.7 3.6 3.6 4.2  CL 96*  --  97* 97*  CO2 33*  --   --  27  GLUCOSE 135*  --  128* 119*  BUN 36*  --  37* 33*  CREATININE 1.69*  --  1.60* 1.48*  CALCIUM 9.3  --   --  8.6*   Liver Function Tests: Recent Labs  Lab 04/22/23 1047  AST 23  ALT 15  ALKPHOS 84  BILITOT 0.7  PROT 5.9*  ALBUMIN 3.4*   CBG: Recent Labs  Lab 04/24/23 1110 04/24/23 1626 04/24/23 2131 04/25/23 0723 04/25/23 1114  GLUCAP 216* 164* 167* 124* 160*    Discharge time spent: 35 minutes.  Signed: Jacquelin Hawking, MD [facility] 04/25/2023

## 2023-04-25 NOTE — Plan of Care (Signed)
  Problem: Education: Goal: Ability to describe self-care measures that may prevent or decrease complications (Diabetes Survival Skills Education) will improve Outcome: Adequate for Discharge Goal: Individualized Educational Video(s) Outcome: Adequate for Discharge   

## 2023-04-28 ENCOUNTER — Telehealth: Payer: Self-pay | Admitting: *Deleted

## 2023-04-28 ENCOUNTER — Encounter: Payer: Self-pay | Admitting: *Deleted

## 2023-04-28 NOTE — Consult Note (Signed)
   [facility] CM Inpatient Consult   04/28/2023  [name] 06/01/1931 [id number]  Follow up:  SNF  [facility] [[facility]]  Accountable Care Organization [ACO] Patient: Medicare ACO REACH  Primary Care Provider:  Myrlene Broker, MD,  at [facility]   Patient was reviewed for post hospital disposition.  Patient was screened for hospitalization and on behalf of [facility] Care Coordination to assess for post hospital community care needs.  Patient is being considered for a skilled nursing facility level of care for post hospital transition.   Plan:   Will notify the Community [facility] RN can follow for any known or needs for transitional care needs for returning to post facility care coordination needs to return to community. Patient transitioned to [facility] for ST rehab.  For questions or referrals, please contact:   Charlesetta Shanks, RN BSN CCM [facility]  [phone] business mobile phone Toll free office [phone]  *Concierge Line  [phone] Fax number: [phone] [email or web address]

## 2023-04-28 NOTE — Transitions of Care (Post Inpatient/ED Visit) (Signed)
04/28/2023  Name: [name] MRN: [id number] DOB: Aug 14, 1931  Today's TOC FU Call Status: Today's TOC FU Call Status:: Successful TOC FU Call Competed TOC FU Call Complete Date: 04/28/23 Pre-Review of EHR indicated that patient was discharged home to self care; patient contacted accordingly, so reports she was actually discharged to SNF for rehab [facility]); majority of [facility] call deferred accordingly  Transition Care Management Follow-up Telephone Call Date of Discharge: 03/25/23 Discharge Facility: [facility]) Type of Discharge: Inpatient Admission Primary Inpatient Discharge Diagnosis:: progressive confusion/ UTI How have you been since you were released from the hospital?: Same (per son/ caregiver Casimiro Needle: "She was sent to the rehab facility- at first we didn't think she would be going back there, but it happended at the last minute.  Thanks for your call")  Items Reviewed:   Medications Reviewed Today: Medications Reviewed Today     Reviewed by Michaela Corner, RN (Registered Nurse) on 04/28/23 at 1222  Med List Status: <None>   Medication Order Taking? Sig Documenting Provider Last Dose Status Informant  acetaminophen (TYLENOL) 500 MG tablet [id number] No Take 500 mg by mouth 2 (two) times daily as needed for mild pain or headache. [provider] Past Week Active Child, Pharmacy Records  albuterol (VENTOLIN HFA) 108 (90 Base) MCG/ACT inhaler [id number] No Inhale 2 puffs into the lungs every 6 (six) hours as needed for wheezing or shortness of breath. Corwin Levins, MD Past Week Active Child, Pharmacy Records  allopurinol (ZYLOPRIM) 100 MG tablet [id number] No TAKE 1 TABLET BY MOUTH EVERY DAY  Patient taking differently: Take 100 mg by mouth daily.   Myrlene Broker, MD 04/21/2023 Active Child, Pharmacy Records  ALPRAZolam Prudy Feeler) 0.25 MG tablet [id number]  Take 1 tablet (0.25 mg total) by mouth at bedtime as needed for anxiety. TAKE 1 TABLET BY MOUTH AT BEDTIME AS  NEEDED FOR ANXIETY Strength: 0.25 mg Narda Bonds, MD  Active   aspirin EC 81 MG tablet [id number] No Take 81 mg by mouth every morning.  [provider] 04/21/2023 Active Child, Pharmacy Records  atorvastatin (LIPITOR) 40 MG tablet [id number] No TAKE 1 TABLET ONCE A DAY FOR CHOLESTEROL  Patient taking differently: Take 40 mg by mouth at bedtime.   Myrlene Broker, MD 04/21/2023 Active Child, Pharmacy Records  diclofenac Sodium (VOLTAREN) 1 % GEL [id number] No Apply 4 g topically 4 (four) times daily. To affected joint.  Patient taking differently: Apply 4 g topically 4 (four) times daily as needed (Pain). To affected joint.   Rodolph Bong, MD Unk Active Child, Pharmacy Records  fluticasone [facility]) 50 MCG/ACT nasal spray [id number] No Place 2 sprays into both nostrils daily.  Patient taking differently: Place 2 sprays into both nostrils at bedtime.   Catarina Hartshorn, MD 04/21/2023 Active Child, Pharmacy Records  fluticasone furoate-vilanterol (BREO ELLIPTA) 200-25 MCG/ACT AEPB [id number] No TAKE 1 PUFF BY MOUTH EVERY DAY  Patient taking differently: Inhale 1 puff into the lungs daily.   Myrlene Broker, MD 04/21/2023 Active Child, Pharmacy Records  gabapentin (NEURONTIN) 100 MG capsule [id number] No TAKE 1 CAPSULE BY MOUTH TWICE A DAY Myrlene Broker, MD 04/21/2023 Active Child, Pharmacy Records  insulin aspart (NOVOLOG) 100 UNIT/ML injection [id number] No Inject 0-17 Units into the skin 3 (three) times daily with meals. Per sliding scale. [provider] 04/21/2023 Active Child, Pharmacy Records  insulin glargine (LANTUS) 100 UNIT/ML injection [id number] No Inject 0.25 mLs (25 Units total) into the skin at bedtime.  Patient taking differently: Inject 17 Units into the skin at bedtime.   Jae Dire, MD 04/21/2023 Active Child, Pharmacy Records  ipratropium-albuterol (DUONEB) 0.5-2.5 (3) MG/3ML SOLN [id number] No Take 3 mLs by nebulization every 6 (six) hours as needed.   Patient taking differently: Take 3 mLs by nebulization 2 (two) times daily.   Elgergawy, Leana Roe, MD 04/21/2023 Active Child, Pharmacy Records  LACTASE PO [id number] No Take 1 tablet by mouth every morning. [provider] 04/21/2023 Active Child, Pharmacy Records  metoprolol tartrate (LOPRESSOR) 25 MG tablet [id number] No TAKE 1 TABLET BY MOUTH TWICE A DAY Myrlene Broker, MD 04/21/2023 Active Child, Pharmacy Records  Multiple Vitamin (MULTIVITAMIN WITH MINERALS) TABS tablet [id number] No Take 1 tablet by mouth every morning. [provider] 04/21/2023 Active Child, Pharmacy Records  OXYGEN [id number] No Inhale 5-6 L into the lungs continuous. [provider] 04/22/2023 Active Child, Pharmacy Records  Polyethyl Glycol-Propyl Glycol 0.4-0.3 % SOLN [id number] No Place 2 drops into both eyes daily as needed (dry eyes). Systane [provider] 04/21/2023 Active Child, Pharmacy Records  Probiotic Product (PROBIOTIC DAILY PO) [id number] No Take 1 tablet by mouth every morning. [provider] 04/21/2023 Active Child, Pharmacy Records  torsemide (DEMADEX) 10 MG tablet [id number] No Take 3 tablets (30mg ) by mouth every other day  Patient taking differently: Take 10-30 mg by mouth daily. Dosage depending on feet swelling.   Nahser, Deloris Ping, MD 04/21/2023 Active Child, Pharmacy Records  Vitamin D, Ergocalciferol, (DRISDOL) 1.25 MG (50000 UNIT) CAPS capsule [id number] No TAKE 1 CAPSULE (50,000 UNITS TOTAL) BY MOUTH EVERY FRIDAY.  Patient taking differently: Take 50,000 Units by mouth every 7 (seven) days. Friday   Myrlene Broker, MD Past Week Active Child, Pharmacy Records            Home Care and Equipment/Supplies:   Functional Questionnaire:   Follow up appointments reviewed:    Caryl Pina, RN, BSN, CCRN Alumnus RN CM Care Coordination/ Transition of Care- [facility] Care Management [phone]: direct office

## 2023-04-30 DIAGNOSIS — I10 Essential (primary) hypertension: Secondary | ICD-10-CM | POA: Diagnosis not present

## 2023-04-30 DIAGNOSIS — F03A18 Unspecified dementia, mild, with other behavioral disturbance: Secondary | ICD-10-CM | POA: Diagnosis not present

## 2023-04-30 DIAGNOSIS — S12120A Other displaced dens fracture, initial encounter for closed fracture: Secondary | ICD-10-CM | POA: Diagnosis not present

## 2023-05-05 DIAGNOSIS — J9691 Respiratory failure, unspecified with hypoxia: Secondary | ICD-10-CM | POA: Diagnosis not present

## 2023-05-05 DIAGNOSIS — D631 Anemia in chronic kidney disease: Secondary | ICD-10-CM | POA: Diagnosis not present

## 2023-05-05 DIAGNOSIS — Z794 Long term (current) use of insulin: Secondary | ICD-10-CM | POA: Diagnosis not present

## 2023-05-05 DIAGNOSIS — I130 Hypertensive heart and chronic kidney disease with heart failure and stage 1 through stage 4 chronic kidney disease, or unspecified chronic kidney disease: Secondary | ICD-10-CM | POA: Diagnosis not present

## 2023-05-05 DIAGNOSIS — E785 Hyperlipidemia, unspecified: Secondary | ICD-10-CM | POA: Diagnosis not present

## 2023-05-05 DIAGNOSIS — Z87440 Personal history of urinary (tract) infections: Secondary | ICD-10-CM | POA: Diagnosis not present

## 2023-05-05 DIAGNOSIS — F0154 Vascular dementia, unspecified severity, with anxiety: Secondary | ICD-10-CM | POA: Diagnosis not present

## 2023-05-05 DIAGNOSIS — R296 Repeated falls: Secondary | ICD-10-CM | POA: Diagnosis not present

## 2023-05-05 DIAGNOSIS — I509 Heart failure, unspecified: Secondary | ICD-10-CM | POA: Diagnosis not present

## 2023-05-05 DIAGNOSIS — J309 Allergic rhinitis, unspecified: Secondary | ICD-10-CM | POA: Diagnosis not present

## 2023-05-05 DIAGNOSIS — S12100D Unspecified displaced fracture of second cervical vertebra, subsequent encounter for fracture with routine healing: Secondary | ICD-10-CM | POA: Diagnosis not present

## 2023-05-05 DIAGNOSIS — E1122 Type 2 diabetes mellitus with diabetic chronic kidney disease: Secondary | ICD-10-CM | POA: Diagnosis not present

## 2023-05-05 DIAGNOSIS — E669 Obesity, unspecified: Secondary | ICD-10-CM | POA: Diagnosis not present

## 2023-05-05 DIAGNOSIS — M1990 Unspecified osteoarthritis, unspecified site: Secondary | ICD-10-CM | POA: Diagnosis not present

## 2023-05-05 DIAGNOSIS — E1142 Type 2 diabetes mellitus with diabetic polyneuropathy: Secondary | ICD-10-CM | POA: Diagnosis not present

## 2023-05-05 DIAGNOSIS — N1832 Chronic kidney disease, stage 3b: Secondary | ICD-10-CM | POA: Diagnosis not present

## 2023-05-05 DIAGNOSIS — M479 Spondylosis, unspecified: Secondary | ICD-10-CM | POA: Diagnosis not present

## 2023-05-05 DIAGNOSIS — Z9981 Dependence on supplemental oxygen: Secondary | ICD-10-CM | POA: Diagnosis not present

## 2023-05-05 DIAGNOSIS — J449 Chronic obstructive pulmonary disease, unspecified: Secondary | ICD-10-CM | POA: Diagnosis not present

## 2023-06-05 DEATH — deceased

## 2023-06-06 ENCOUNTER — Encounter: Payer: Self-pay | Admitting: Cardiovascular Disease

## 2023-06-17 ENCOUNTER — Ambulatory Visit: Payer: Medicare Other | Admitting: Cardiovascular Disease
